# Patient Record
Sex: Female | Born: 1965
Health system: Southern US, Community
[De-identification: ages and names within clinical notes are randomized; demographics above are authoritative.]

## PROBLEM LIST (undated history)

## (undated) DIAGNOSIS — M797 Fibromyalgia: Secondary | ICD-10-CM

## (undated) DIAGNOSIS — T7840XA Allergy, unspecified, initial encounter: Secondary | ICD-10-CM

## (undated) DIAGNOSIS — N809 Endometriosis, unspecified: Secondary | ICD-10-CM

## (undated) DIAGNOSIS — R609 Edema, unspecified: Secondary | ICD-10-CM

## (undated) DIAGNOSIS — K219 Gastro-esophageal reflux disease without esophagitis: Secondary | ICD-10-CM

## (undated) DIAGNOSIS — F329 Major depressive disorder, single episode, unspecified: Secondary | ICD-10-CM

## (undated) DIAGNOSIS — R600 Localized edema: Secondary | ICD-10-CM

## (undated) DIAGNOSIS — F419 Anxiety disorder, unspecified: Secondary | ICD-10-CM

## (undated) DIAGNOSIS — F32A Depression, unspecified: Secondary | ICD-10-CM

## (undated) DIAGNOSIS — J302 Other seasonal allergic rhinitis: Secondary | ICD-10-CM

## (undated) DIAGNOSIS — Z87442 Personal history of urinary calculi: Secondary | ICD-10-CM

## (undated) DIAGNOSIS — M549 Dorsalgia, unspecified: Secondary | ICD-10-CM

## (undated) DIAGNOSIS — G43909 Migraine, unspecified, not intractable, without status migrainosus: Secondary | ICD-10-CM

## (undated) DIAGNOSIS — D649 Anemia, unspecified: Secondary | ICD-10-CM

## (undated) DIAGNOSIS — M62838 Other muscle spasm: Secondary | ICD-10-CM

## (undated) DIAGNOSIS — Z86718 Personal history of other venous thrombosis and embolism: Secondary | ICD-10-CM

## (undated) DIAGNOSIS — E785 Hyperlipidemia, unspecified: Secondary | ICD-10-CM

## (undated) DIAGNOSIS — G935 Compression of brain: Secondary | ICD-10-CM

## (undated) DIAGNOSIS — R519 Headache, unspecified: Secondary | ICD-10-CM

## (undated) DIAGNOSIS — G8929 Other chronic pain: Secondary | ICD-10-CM

## (undated) DIAGNOSIS — M1990 Unspecified osteoarthritis, unspecified site: Secondary | ICD-10-CM

## (undated) DIAGNOSIS — R112 Nausea with vomiting, unspecified: Secondary | ICD-10-CM

## (undated) DIAGNOSIS — G971 Other reaction to spinal and lumbar puncture: Secondary | ICD-10-CM

## (undated) DIAGNOSIS — Z8614 Personal history of Methicillin resistant Staphylococcus aureus infection: Secondary | ICD-10-CM

## (undated) DIAGNOSIS — R531 Weakness: Secondary | ICD-10-CM

## (undated) DIAGNOSIS — G894 Chronic pain syndrome: Secondary | ICD-10-CM

## (undated) DIAGNOSIS — Z9889 Other specified postprocedural states: Secondary | ICD-10-CM

## (undated) DIAGNOSIS — K5909 Other constipation: Secondary | ICD-10-CM

## (undated) DIAGNOSIS — G629 Polyneuropathy, unspecified: Secondary | ICD-10-CM

## (undated) DIAGNOSIS — R091 Pleurisy: Secondary | ICD-10-CM

## (undated) DIAGNOSIS — J189 Pneumonia, unspecified organism: Secondary | ICD-10-CM

## (undated) HISTORY — DX: Depression, unspecified: F32.A

## (undated) HISTORY — DX: Hyperlipidemia, unspecified: E78.5

## (undated) HISTORY — PX: COLONOSCOPY: SHX174

## (undated) HISTORY — PX: APPENDECTOMY: SHX54

## (undated) HISTORY — PX: OTHER SURGICAL HISTORY: SHX169

## (undated) HISTORY — PX: REDUCTION MAMMAPLASTY: SUR839

## (undated) HISTORY — PX: SPINE SURGERY: SHX786

## (undated) HISTORY — PX: LAPAROSCOPIC CHOLECYSTECTOMY: SUR755

## (undated) HISTORY — DX: Headache, unspecified: R51.9

## (undated) HISTORY — PX: BACK SURGERY: SHX140

## (undated) HISTORY — PX: ESOPHAGOGASTRODUODENOSCOPY: SHX1529

## (undated) HISTORY — PX: BRAIN SURGERY: SHX531

## (undated) HISTORY — PX: DILATION AND CURETTAGE OF UTERUS: SHX78

## (undated) HISTORY — DX: Gastro-esophageal reflux disease without esophagitis: K21.9

## (undated) HISTORY — DX: Allergy, unspecified, initial encounter: T78.40XA

## (undated) HISTORY — DX: Major depressive disorder, single episode, unspecified: F32.9

---
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
## 1992-05-18 HISTORY — PX: TOTAL ABDOMINAL HYSTERECTOMY: SHX209

## 1998-05-18 DIAGNOSIS — R091 Pleurisy: Secondary | ICD-10-CM

## 1998-05-18 HISTORY — DX: Pleurisy: R09.1

## 1999-05-19 HISTORY — PX: CARPAL TUNNEL RELEASE: SHX101

## 2001-01-28 ENCOUNTER — Encounter: Payer: Self-pay | Admitting: Neurosurgery

## 2001-01-28 ENCOUNTER — Encounter: Admission: RE | Admit: 2001-01-28 | Discharge: 2001-01-28 | Payer: Self-pay | Admitting: Neurosurgery

## 2001-01-31 ENCOUNTER — Encounter: Admission: RE | Admit: 2001-01-31 | Discharge: 2001-01-31 | Payer: Self-pay | Admitting: Neurosurgery

## 2001-01-31 ENCOUNTER — Encounter: Payer: Self-pay | Admitting: Neurosurgery

## 2001-02-23 ENCOUNTER — Encounter: Payer: Self-pay | Admitting: Neurosurgery

## 2001-03-01 ENCOUNTER — Ambulatory Visit (HOSPITAL_COMMUNITY): Admission: RE | Admit: 2001-03-01 | Discharge: 2001-03-02 | Payer: Self-pay | Admitting: Neurosurgery

## 2001-03-01 ENCOUNTER — Encounter: Payer: Self-pay | Admitting: Neurosurgery

## 2001-03-01 HISTORY — PX: POSTERIOR CERVICAL LAMINECTOMY: SHX2248

## 2002-07-18 ENCOUNTER — Encounter: Payer: Self-pay | Admitting: Neurosurgery

## 2002-07-18 ENCOUNTER — Encounter: Admission: RE | Admit: 2002-07-18 | Discharge: 2002-07-18 | Payer: Self-pay | Admitting: Neurosurgery

## 2002-08-07 ENCOUNTER — Inpatient Hospital Stay (HOSPITAL_COMMUNITY): Admission: AD | Admit: 2002-08-07 | Discharge: 2002-08-13 | Payer: Self-pay | Admitting: Neurosurgery

## 2002-08-09 ENCOUNTER — Encounter: Payer: Self-pay | Admitting: Neurosurgery

## 2002-09-05 ENCOUNTER — Ambulatory Visit (HOSPITAL_COMMUNITY): Admission: RE | Admit: 2002-09-05 | Discharge: 2002-09-06 | Payer: Self-pay | Admitting: Neurosurgery

## 2003-07-19 ENCOUNTER — Encounter: Admission: RE | Admit: 2003-07-19 | Discharge: 2003-07-19 | Payer: Self-pay | Admitting: Neurosurgery

## 2003-08-02 ENCOUNTER — Encounter: Admission: RE | Admit: 2003-08-02 | Discharge: 2003-08-02 | Payer: Self-pay | Admitting: Neurosurgery

## 2003-08-31 ENCOUNTER — Encounter (INDEPENDENT_AMBULATORY_CARE_PROVIDER_SITE_OTHER): Payer: Self-pay | Admitting: Specialist

## 2003-08-31 ENCOUNTER — Ambulatory Visit (HOSPITAL_COMMUNITY): Admission: RE | Admit: 2003-08-31 | Discharge: 2003-09-01 | Payer: Self-pay | Admitting: Neurosurgery

## 2003-12-21 ENCOUNTER — Encounter: Admission: RE | Admit: 2003-12-21 | Discharge: 2003-12-21 | Payer: Self-pay | Admitting: Neurosurgery

## 2003-12-24 ENCOUNTER — Inpatient Hospital Stay (HOSPITAL_COMMUNITY): Admission: RE | Admit: 2003-12-24 | Discharge: 2003-12-28 | Payer: Self-pay | Admitting: Neurosurgery

## 2003-12-31 ENCOUNTER — Emergency Department (HOSPITAL_COMMUNITY): Admission: EM | Admit: 2003-12-31 | Discharge: 2003-12-31 | Payer: Self-pay | Admitting: Emergency Medicine

## 2004-01-03 ENCOUNTER — Inpatient Hospital Stay (HOSPITAL_COMMUNITY): Admission: AD | Admit: 2004-01-03 | Discharge: 2004-01-10 | Payer: Self-pay | Admitting: Neurological Surgery

## 2004-04-22 ENCOUNTER — Encounter: Admission: RE | Admit: 2004-04-22 | Discharge: 2004-04-22 | Payer: Self-pay | Admitting: Neurosurgery

## 2005-03-01 ENCOUNTER — Emergency Department (HOSPITAL_COMMUNITY): Admission: EM | Admit: 2005-03-01 | Discharge: 2005-03-01 | Payer: Self-pay | Admitting: Emergency Medicine

## 2005-03-02 ENCOUNTER — Emergency Department (HOSPITAL_COMMUNITY): Admission: EM | Admit: 2005-03-02 | Discharge: 2005-03-02 | Payer: Self-pay | Admitting: Emergency Medicine

## 2005-05-20 ENCOUNTER — Ambulatory Visit (HOSPITAL_COMMUNITY): Admission: AD | Admit: 2005-05-20 | Discharge: 2005-05-23 | Payer: Self-pay | Admitting: Neurosurgery

## 2005-05-20 HISTORY — PX: OTHER SURGICAL HISTORY: SHX169

## 2006-12-20 ENCOUNTER — Inpatient Hospital Stay (HOSPITAL_COMMUNITY): Admission: RE | Admit: 2006-12-20 | Discharge: 2006-12-24 | Payer: Self-pay | Admitting: Neurosurgery

## 2006-12-20 HISTORY — PX: OTHER SURGICAL HISTORY: SHX169

## 2007-02-04 ENCOUNTER — Ambulatory Visit (HOSPITAL_COMMUNITY): Admission: RE | Admit: 2007-02-04 | Discharge: 2007-02-06 | Payer: Self-pay | Admitting: Neurosurgery

## 2007-03-08 ENCOUNTER — Encounter: Admission: RE | Admit: 2007-03-08 | Discharge: 2007-03-08 | Payer: Self-pay | Admitting: Neurosurgery

## 2007-05-10 ENCOUNTER — Encounter: Admission: RE | Admit: 2007-05-10 | Discharge: 2007-05-10 | Payer: Self-pay | Admitting: Neurosurgery

## 2007-10-13 ENCOUNTER — Encounter: Admission: RE | Admit: 2007-10-13 | Discharge: 2007-10-13 | Payer: Self-pay | Admitting: Neurosurgery

## 2007-11-10 ENCOUNTER — Encounter: Admission: RE | Admit: 2007-11-10 | Discharge: 2007-11-10 | Payer: Self-pay | Admitting: Unknown Physician Specialty

## 2008-04-27 ENCOUNTER — Encounter: Admission: RE | Admit: 2008-04-27 | Discharge: 2008-04-27 | Payer: Self-pay | Admitting: Neurosurgery

## 2009-05-18 HISTORY — PX: LAPAROSCOPIC GASTRIC BANDING: SHX1100

## 2009-07-23 ENCOUNTER — Encounter: Admission: RE | Admit: 2009-07-23 | Discharge: 2009-07-23 | Payer: Self-pay | Admitting: Neurosurgery

## 2009-07-25 ENCOUNTER — Inpatient Hospital Stay (HOSPITAL_COMMUNITY): Admission: RE | Admit: 2009-07-25 | Discharge: 2009-07-26 | Payer: Self-pay | Admitting: Neurosurgery

## 2009-07-25 HISTORY — PX: ANTERIOR CERVICAL DECOMP/DISCECTOMY FUSION: SHX1161

## 2009-08-13 ENCOUNTER — Ambulatory Visit (HOSPITAL_COMMUNITY): Admission: RE | Admit: 2009-08-13 | Discharge: 2009-08-15 | Payer: Self-pay | Admitting: Neurosurgery

## 2009-08-13 ENCOUNTER — Encounter: Admission: RE | Admit: 2009-08-13 | Discharge: 2009-08-13 | Payer: Self-pay | Admitting: Neurosurgery

## 2009-08-18 ENCOUNTER — Emergency Department (HOSPITAL_COMMUNITY): Admission: EM | Admit: 2009-08-18 | Discharge: 2009-08-18 | Payer: Self-pay | Admitting: Emergency Medicine

## 2010-01-01 ENCOUNTER — Encounter: Admission: RE | Admit: 2010-01-01 | Discharge: 2010-01-01 | Payer: Self-pay | Admitting: Unknown Physician Specialty

## 2010-06-08 ENCOUNTER — Encounter: Payer: Self-pay | Admitting: Neurosurgery

## 2010-08-06 LAB — PROTIME-INR
INR: 0.98 (ref 0.00–1.49)
Prothrombin Time: 12.9 seconds (ref 11.6–15.2)

## 2010-08-06 LAB — CBC
HCT: 50.9 % — ABNORMAL HIGH (ref 36.0–46.0)
Hemoglobin: 17.1 g/dL — ABNORMAL HIGH (ref 12.0–15.0)
MCHC: 33.7 g/dL (ref 30.0–36.0)
MCV: 89.4 fL (ref 78.0–100.0)
Platelets: 290 10*3/uL (ref 150–400)
RBC: 5.69 MIL/uL — ABNORMAL HIGH (ref 3.87–5.11)
RDW: 13.1 % (ref 11.5–15.5)
WBC: 14.5 10*3/uL — ABNORMAL HIGH (ref 4.0–10.5)

## 2010-08-06 LAB — HEPATIC FUNCTION PANEL
ALT: 16 U/L (ref 0–35)
AST: 17 U/L (ref 0–37)
Albumin: 3.9 g/dL (ref 3.5–5.2)
Alkaline Phosphatase: 116 U/L (ref 39–117)
Bilirubin, Direct: 0.2 mg/dL (ref 0.0–0.3)
Indirect Bilirubin: 0.8 mg/dL (ref 0.3–0.9)
Total Bilirubin: 1 mg/dL (ref 0.3–1.2)
Total Protein: 7 g/dL (ref 6.0–8.3)

## 2010-08-06 LAB — URINALYSIS, ROUTINE W REFLEX MICROSCOPIC
Bilirubin Urine: NEGATIVE
Glucose, UA: NEGATIVE mg/dL
Hgb urine dipstick: NEGATIVE
Ketones, ur: 15 mg/dL — AB
Nitrite: NEGATIVE
Protein, ur: NEGATIVE mg/dL
Specific Gravity, Urine: 1.021 (ref 1.005–1.030)
Urobilinogen, UA: 1 mg/dL (ref 0.0–1.0)
pH: 7.5 (ref 5.0–8.0)

## 2010-08-06 LAB — POCT I-STAT, CHEM 8
BUN: 19 mg/dL (ref 6–23)
Calcium, Ion: 1.02 mmol/L — ABNORMAL LOW (ref 1.12–1.32)
Chloride: 102 mEq/L (ref 96–112)
Creatinine, Ser: 0.7 mg/dL (ref 0.4–1.2)
Glucose, Bld: 107 mg/dL — ABNORMAL HIGH (ref 70–99)
HCT: 55 % — ABNORMAL HIGH (ref 36.0–46.0)
Hemoglobin: 18.7 g/dL — ABNORMAL HIGH (ref 12.0–15.0)
Potassium: 6.5 mEq/L (ref 3.5–5.1)
Sodium: 135 mEq/L (ref 135–145)
TCO2: 34 mmol/L (ref 0–100)

## 2010-08-06 LAB — DIFFERENTIAL
Basophils Absolute: 0.1 10*3/uL (ref 0.0–0.1)
Basophils Relative: 1 % (ref 0–1)
Eosinophils Absolute: 0.3 10*3/uL (ref 0.0–0.7)
Eosinophils Relative: 2 % (ref 0–5)
Lymphocytes Relative: 20 % (ref 12–46)
Lymphs Abs: 2.9 10*3/uL (ref 0.7–4.0)
Monocytes Absolute: 1 10*3/uL (ref 0.1–1.0)
Monocytes Relative: 7 % (ref 3–12)
Neutro Abs: 10.2 10*3/uL — ABNORMAL HIGH (ref 1.7–7.7)
Neutrophils Relative %: 70 % (ref 43–77)

## 2010-08-06 LAB — LIPASE, BLOOD: Lipase: 29 U/L (ref 11–59)

## 2010-08-06 LAB — POCT CARDIAC MARKERS
CKMB, poc: <1 ng/mL — ABNORMAL LOW (ref 1.0–8.0)
CKMB, poc: <1 ng/mL — ABNORMAL LOW (ref 1.0–8.0)
Myoglobin, poc: 39.4 ng/mL (ref 12–200)
Myoglobin, poc: 56 ng/mL (ref 12–200)
Troponin i, poc: <0.05 ng/mL (ref 0.00–0.09)
Troponin i, poc: <0.05 ng/mL (ref 0.00–0.09)

## 2010-08-06 LAB — POTASSIUM
Potassium: 4.4 mEq/L (ref 3.5–5.1)
Potassium: 4.9 mEq/L (ref 3.5–5.1)

## 2010-08-10 LAB — CBC
HCT: 40.9 % (ref 36.0–46.0)
HCT: 42.1 % (ref 36.0–46.0)
Hemoglobin: 13.9 g/dL (ref 12.0–15.0)
Hemoglobin: 14.1 g/dL (ref 12.0–15.0)
MCHC: 33.5 g/dL (ref 30.0–36.0)
MCHC: 34.1 g/dL (ref 30.0–36.0)
MCV: 89.8 fL (ref 78.0–100.0)
MCV: 90.1 fL (ref 78.0–100.0)
Platelets: 238 10*3/uL (ref 150–400)
Platelets: 249 10*3/uL (ref 150–400)
RBC: 4.53 MIL/uL (ref 3.87–5.11)
RBC: 4.69 MIL/uL (ref 3.87–5.11)
RDW: 12.6 % (ref 11.5–15.5)
RDW: 12.8 % (ref 11.5–15.5)
WBC: 7.3 10*3/uL (ref 4.0–10.5)
WBC: 8.7 10*3/uL (ref 4.0–10.5)

## 2010-08-10 LAB — BASIC METABOLIC PANEL
BUN: 7 mg/dL (ref 6–23)
BUN: 8 mg/dL (ref 6–23)
CO2: 25 mEq/L (ref 19–32)
CO2: 31 mEq/L (ref 19–32)
Calcium: 9 mg/dL (ref 8.4–10.5)
Calcium: 9.4 mg/dL (ref 8.4–10.5)
Chloride: 102 mEq/L (ref 96–112)
Chloride: 105 mEq/L (ref 96–112)
Creatinine, Ser: 0.67 mg/dL (ref 0.4–1.2)
Creatinine, Ser: 0.71 mg/dL (ref 0.4–1.2)
GFR calc Af Amer: >60 mL/min (ref >60)
GFR calc Af Amer: >60 mL/min (ref >60)
GFR calc non Af Amer: >60 mL/min (ref >60)
GFR calc non Af Amer: >60 mL/min (ref >60)
Glucose, Bld: 108 mg/dL — ABNORMAL HIGH (ref 70–99)
Glucose, Bld: 94 mg/dL (ref 70–99)
Potassium: 3.7 mEq/L (ref 3.5–5.1)
Potassium: 4 mEq/L (ref 3.5–5.1)
Sodium: 139 mEq/L (ref 135–145)
Sodium: 140 mEq/L (ref 135–145)

## 2010-08-10 LAB — DIFFERENTIAL
Basophils Absolute: 0.1 10*3/uL (ref 0.0–0.1)
Basophils Relative: 1 % (ref 0–1)
Eosinophils Absolute: 0.1 10*3/uL (ref 0.0–0.7)
Eosinophils Relative: 1 % (ref 0–5)
Lymphocytes Relative: 24 % (ref 12–46)
Lymphs Abs: 2 10*3/uL (ref 0.7–4.0)
Monocytes Absolute: 0.6 10*3/uL (ref 0.1–1.0)
Monocytes Relative: 7 % (ref 3–12)
Neutro Abs: 5.9 10*3/uL (ref 1.7–7.7)
Neutrophils Relative %: 68 % (ref 43–77)

## 2010-08-10 LAB — CULTURE, BLOOD (ROUTINE X 2)
Culture: NO GROWTH
Culture: NO GROWTH

## 2010-08-10 LAB — SURGICAL PCR SCREEN
MRSA, PCR: NEGATIVE
Staphylococcus aureus: NEGATIVE

## 2010-09-23 DIAGNOSIS — Z9884 Bariatric surgery status: Secondary | ICD-10-CM | POA: Insufficient documentation

## 2010-09-30 NOTE — Op Note (Signed)
NAMEJACK, MINEAU NO.:  [id number]   MEDICAL RECORD NO.:  [id number]          PATIENT TYPE:  INP   LOCATION:  5149                         FACILITY:  [facility]   PHYSICIAN:  Donalee Citrin, M.D.        DATE OF BIRTH:  Oct 15, 1965   DATE OF PROCEDURE:  02/04/2007  DATE OF DISCHARGE:  12/24/2006                               OPERATIVE REPORT   PREOPERATIVE DIAGNOSIS:  Lumbar wound hematoma/infection.   POSTOPERATIVE DIAGNOSIS:  Lumbar wound hematoma.   PROCEDURE:  Re-exploration of L4 to S1 fusion with irrigation and  debridement of lumbar wound hematoma.   SURGEON:  Donalee Citrin, M.D.   ANESTHESIA:  General endotracheal anesthesia.   HISTORY OF PRESENT ILLNESS:  The patient is very pleasant 41-year female  who underwent redo decompressive laminectomy and fusion at L4-L5 and L5-  S1 approximately six weeks ago. Over the last two weeks, the patient  progressively worsening leg pain refractory to steroid medication.  The  patient underwent an MRI scan earlier in the week that showed a dorsal  epidural fluid collection with significant thecal sac compression.  This  had some rim enhancement suspicious for hematoma.  The patient's white  count was 10.9, slightly elevated, and due to the thecal sac  compression, the patient's worsening symptomatology, and the fact that  she had had a fusion, the patient was recommended re-exploration, I&D,  evacuation of the fluid collection.  The risks and benefits of the  operation were explained to the patient who understood and agreed to  proceed forth.   DESCRIPTION OF PROCEDURE:  The patient was brought to the operating room  and was induced under general anesthesia.  She was positioned prone and  her back was prepped and draped in the usual fashion.  Her old incision  was opened up and Bovie cautery was used to dissect through the  subcutaneous tissues.  The lumbar peritoneal shunt catheter was  immediately visualized and care was  taken not to violate it.  There was  an immediate subcutaneous fluid collection that was sent for cultures.  This appeared to be old blood, not purulent. Then the fascia was  dissected off. The hardware was immediately identified. The transverse  connector was identified. Using the transverse connector as a guide, the  remainder of the scar tissue and muscles were dissected off the top of  the transverse connector and this immediately allowed [place] entry into the  dorsal epidural fluid collection which, again, was cultured and sent for  gram stain. This, again, all appeared to be old seromatous fluid and did  not have the appearance or texture of purulence.  It also did not appear  to be CSF.  The dura was inspected and thecal sac was noted to be  markedly decompressed after evacuation of the epidural fluid collection.  Some of the dorsal fibrous epidural clot was dissected off to obtain  access into the thecal sac.  The neural foramina were all reinspected  and noted to be widely patent.  Then Pulsavac irrigation with 3 liters  of vancomycin irrigation was used to wash  out the wound.  Then two  median Hemovac drains were placed threading underneath the cross-link  cephalocaudal in the epidural  space.  The muscle was then reapproximated with interrupted Vicryl, the  fascia was then also reapproximated with interrupted Vicryl, the  subcutaneous tissue was closed with interrupted Vicryl, and the skin was  closed running 4-0 subcuticular.  Benzoin and Steri-Strips were applied.  The patient went to the recovery room in stable condition.           ______________________________  Donalee Citrin, M.D.     GC/MEDQ  D:  02/04/2007  T:  02/05/2007  Job:  [id number]

## 2010-09-30 NOTE — Discharge Summary (Signed)
NAMEALYZA, Katrina Fernandez NO.:  [id number]   MEDICAL RECORD NO.:  [id number]          PATIENT TYPE:  INP   LOCATION:  5149                         FACILITY:  [facility]   PHYSICIAN:  Donalee Citrin, M.D.        DATE OF BIRTH:  [birth year]   DATE OF ADMISSION:  12/20/2006  DATE OF DISCHARGE:  12/24/2006                               DISCHARGE SUMMARY   ADMISSION DIAGNOSIS:  Degenerative disk disease and ruptured disk, L4-5  and L5-S1.   PROCEDURE:  Lumbar laminectomy, decompressive, with posterior lumbar  interbody fusion, L4-5 and L5-S1.   HOSPITAL COURSE:  The patient was admitted as an EMA, went to the  operating room and underwent the above-mentioned procedure.  Postop the  patient did very well and __________  the floor.  On the floor the  patient convalesced well.  Pain was difficult to control initially;  however, medications were adjusted and changed and she was able to  progressively mobilize over the next 24-48 hours.  Her drain was able to  be taken out 48 hours postop.  Over the next 2 days the patient did very  well, getting up out of bed, ambulating, and was voiding spontaneously.  The pain was controlled on pills.  The patient was stable to be  discharged home with scheduled follow-up in 2 weeks with neurosurgery.           ______________________________  Donalee Citrin, M.D.     GC/MEDQ  D:  02/02/2007  T:  02/02/2007  Job:  [id number]

## 2010-09-30 NOTE — Op Note (Signed)
NAMEMARLANE, HIRSCHMANN            ACCOUNT NO.:  [id number]   MEDICAL RECORD NO.:  [id number]          PATIENT TYPE:  INP   LOCATION:  3172                         FACILITY:  [facility]   PHYSICIAN:  Donalee Citrin, M.D.        DATE OF BIRTH:  [birth year]   DATE OF PROCEDURE:  12/20/2006  DATE OF DISCHARGE:                               OPERATIVE REPORT   PREOPERATIVE DIAGNOSES:  Degenerative disk disease L4-5, L5-S1 with left  greater than right L5 and S1 radiculopathies, severe diskogenic  mechanical low back pain, arachnoiditis.   POSTOPERATIVE DIAGNOSES:  Not given.   PROCEDURE:  Redo decompressive laminectomy L4-5 and L5-S1, posterior  lumbar interbody fusion L4-5, L5-S1 using a hybrid tangent allograft  wedge and Telamon PEEK cage packed with locally harvested autograft  mixed with Progenics bone substitute, 10 x 22 and 10 x 26 respectively  L4-5 and 8 x 22 and 8 x 26 respectively at L5-S1. Pedicle screw  fixation L4-S1 using the 6.35 Legacy pedicle screw system,  posterolateral arthrodesis L4-S1 using locally harvested autograft mixed  with DBX bone substitute and placement of medication Hemovac drain.   SURGEON:  Dr. Donalee Citrin.   ASSISTANT:  Dr. Aliene Beams.   ANESTHESIA.:  General endotracheal.   HISTORY OF PRESENT ILLNESS:  The patient is a very pleasant, 45 year old  female whose had  longstanding back and bilateral leg pain and has  undergone previous laminotomies with diskectomies on the right side at  L4-5 and L5-S1. However, the patient had progressively worsening back  pain that was mechanical in nature worse when going from the lying to  sitting position as well as radicular pain in conjunction with a  baseline recognized arachnoiditis that I counseled her would not be  better with this form surgery and actually could be made worse, but she  said her main complaint was predominantly the back pain with secondary  radiculopathy. Imaging showed severe degenerative  collapse at L5-S1  causing marked compression of the fibers due to the degenerative  collapse as well as a large central disk herniation at L4-5 with marked  stenosis and degeneration at that level as well.  So the risks,  benefits, and alternatives of the operation were explained to the  patient. She understands and after failure of all forms of conservative  treatment agreed to proceed forward. The patient was brought to the OR  and was induced under general anesthesia, positioned prone on the Wilson  frame, back prepped and prepped in the usual sterile fashion.  Preoperative x-ray localized the L5 spinous process. Her old incision  was opened up and Bovie electrocautery was used to take down skin,  subperiosteal dissection carried out to the lamina of L4-5 and S1  bilaterally.  During the dissection, the part of the LP shunt catheter  was immediately visualized and care was taken to avoid this during the  dissection and it was actually entering in up at the L3-4 disk space  level, so after complete dissection, the pedicles have been identified  at L4-5, S1. The scar tissue dissected off of the right side laminotomy  defects at L4-5 and L5-S1. Intraoperative x-rays confirmed localization  at the appropriate level.  The residual spinous process at L4 and L5  were removed, a complete medial decompressive laminotomy was performed  at L4-5 and L5-S1 first working on the patient's left side in virgin  territory. Complete medial facetectomy was performed at L4-5 and S1.  There was noted to be a tremendous amount of epidural fat causing marked  stenosis of the thecal sac and displacing it to the right.  This was all  teased away with a 4 Penfield and removed with pituitary rongeurs and  Kerrison punches.  Then after all the neural foramina were decompressed  and unroofed at L4-5 and S1 on the left side, attention taken to the  right side. Scar tissue was extensively dissected free using a 4   Penfield and dental dissectors. Complete medial facetectomies were  performed decompressing the L4-5 and 1 roots respectively on the right  side and then after the decompression had been completed and all neural  foramina were widely patent, attention was taken to pedicle screw  placement. Using a high-speed drill pilot holes were drilled at L4 and  cannulated with the awl,  probed, tapped with a 5.5 tap, probed again  and a 6.5 x 45 screw inserted on the left at L4 using both external bony  landmarks and from within the canal confirming no mediolateral breach.  This screw had excellent purchase and was placed well. In a similar  fashion, the 6.5 x 45 screw was inserted at L5 and a 6.5 x 35 at S1 and  then the right-sided screws were inserted also in similar fashion. After  all six screws had been placed, attention taken to the interbody work,  there was extensive scar tissue at the disk space levels at L4-5 and L5-  S1 on the right.  This was teased away with a 4 Penfield. The epidural  veins were coagulated. A lot of the epidural lipomatosis was also  dissected off the roots on this side identifying the neural foramen and  the lateral disk space.  Then using D'Errico reflecting the L4-L5 nerve  root medially, annulotomy was made with a #11 blade scalpel and cleaned  out and a size 10 distractor was inserted.  This had good apposition of  the endplates, felt to be appropriate sizing for the grafts.  Then on  the right side at L5-S1 in a similar fashion, the disk space was cleaned  out. Initially a size 7 distractor was placed because the disk  was  noted to be markedly degenerated collapsed. Then working on the left  side reflecting the left S1 nerve root medially, annulotomy was made,  the disk space was cleaned out and this was stepped up to a 9 distractor  with a 7 distractor removed from the right side. Then initially working  on the right side at L5-S1 level, the scar tissue was freed  up, the  degenerative was cleaned out. A size 8 cutter and scissors were used to  prepare the endplates. A size 8 x 26 mm tangent allograft wedge was  inserted on the right side.  Then on the left side, the distractor was  removed in a similar fashion.  The disk space was cleaned out, locally  harvested autograft mixed with DBX bone substitute was packed against  the right sided tangent and a Telamon 8 x 22 mm cage was packed with  Progenix and locally harvested autograft was inserted on  the left side.  Fluoroscopy confirmed each step along the way, good depth and  trajectory. Then L4-5 was performed in a similar fashion using 10 x 22  Telamon  cage and a 10 x 26 tangent allograft wedge. There was noted to  be a very large central disk herniation at L4-5. This was teased away  with a downgoing Epstein curette and upbiting pituitaries and at the end  of the diskectomy there was no __________ or either L4-5 or S1 nerve  root. After all the interbody work had been completed, aggressive  irrigation was performed then aggressive decortication was carried out  __________  at L4-5 and S1. The remainder of the autograft mixed with  Progenix was packed in the lateral gutters.  Then 60 mm rods were  inserted, tapped, tightened, __________ at S1. The L5 pedicle screw was  compressed against S1 and the L4 compressed against L5. All the neural  foramina were re-explored at this point to confirm no migration of graft  material and wide patency. Then Gelfoam was laid atop the dura, a 422  cross-link was inserted between the 4-5 screws.  After meticulous  hemostasis had been maintained, the muscle and fascia were  reapproximated with interrupted Vicryl after placement of medium Hemovac  drain and care was taken to avoid the lumboperitoneal catheter.  The  wound was closed in layers with Vicryl with a running 4-0 subcuticular,  Dermabond on the skin. At the end of the case needle count and sponge  count  were correct.           ______________________________  Donalee Citrin, M.D.     GC/MEDQ  D:  12/20/2006  T:  12/20/2006  Job:  [id number]

## 2010-10-03 NOTE — Op Note (Signed)
[place]  Patient:    Katrina Fernandez, Katrina Fernandez Visit Number: [id number] MRN: [id number]          Service Type: DSU Location: 3000 [id number] Attending Physician:  Mariam Dollar Dictated by:   Garlon Hatchet., M.D. Proc. 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  PROCEDURE:  Posterior cervical laminectomy and decompressive laminectomy and foraminotomy with C7-8 nerve roots with exploration of the C8 perineural cyst, placement to seal.  SURGEON:  Garlon Hatchet., M.D.  ASSISTANT:  Julio Sicks, M.D.  ANESTHESIA:  General endotracheal.  HISTORY OF PRESENT ILLNESS:  The patient is a very pleasant 45 year old female who has had long-standing neck and right arm pain radiating down through her triceps into the first two fingers of her right hand with numbness and tingling in the same distribution.  Preoperative examination showed the patient had weakness of her triceps.  She also historically had spinal headaches.  Headaches were much worse when sitting, better when laying down. MRI, CT myelography showed a conjoin nerve roots of C7-8 with delayed filling of a perineural cyst that looked like it was coming off the C7-8 complex, and noted to have compression of both nerve roots.  As the patient had failed conservative treatment, and extensively counseled of the risks, benefits, methods of surgery, it was decided to proceed forward with decompressive cervical laminectomy and foraminotomies of C7 and C8 and exploration of the cyst with possible resection.  The patient had an understanding of the risk, we proceeded forward.  DESCRIPTION OF PROCEDURE:  The patient was brought to the OR, was induced under general anesthesia.  Positioned prone in pin.  The neck was prepped and draped in  the usual sterile fashion.  A midline incision was made after localization of a needle over C7-T1 disk space.  Subperiosteal dissection was carried out.  The lamina and spinous process of C7 and T1 on the right side, exposing the facet complex.  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  The entire facet complex at C7 was removed exposing the C7 nerve root.  Attention was taken to removal of the top part of the lamina of T1, and identification of the C8 nerve root.  These were noted to be conjoin, and followed up their foramen. The C8 nerve root was noted to give rise to a large perineural cyst that looked like it extended in a fusiform fashion distally around the foramen. This was exposed and dissected free, and noted to be completely within the nerve root with no plane to dissect it free from the nerve root, and as dictated, the fusion appearance extending distally out the neuroforamin was noted to have no plane to be able to dissect free and bring back proximally. The C7 nerve root was teased away from this, and a plane was able to be developed of the distal C7 nerve root from this cystic nerve.  This disk was noted on certain aspects of it to have very thin membrane.  It was decided at this  time not to resect the cyst and to place the seal around it to possibly prevent any spinal fluid leak that may be contributing to her spinal headaches.  The seal was applied, and both neuroforamina were widely decompressed, and the cyst was widely decompressed.  Gelfoam was overlaid on top of the dura.  Meticulous hemostasis was maintained.  The muscle and fascia were closed with 0 interrupted Vicryl.  The subcutaneous tissue was closed with 2-0 interrupted Vicryl.  The skin was closed with running 4-0 subcuticular.  Benzoin and Steri-Strips were applied.   The patient went to the recovery room in stable condition.  At the end of the case, all sponge, needle, and instrument counts were correct. Dictated by:   Garlon Hatchet., M.D. Attending Physician:  Mariam Dollar DD:  04/27/01 TD:  04/27/01 Job: [id number]

## 2010-10-03 NOTE — Op Note (Signed)
NAME:  Katrina Fernandez, Katrina Fernandez                      ACCOUNT NO.:  [id number]   MEDICAL RECORD NO.:  [id number]                   PATIENT TYPE:  OIB   LOCATION:  3012                                 FACILITY:  [facility]   PHYSICIAN:  Donalee Citrin, M.D.                     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   PROCEDURE:  Lumbar peritoneal shunt placement using the MMT lumboperitoneal  shunt with the 85 horizontal pressure valve with _______ 7 device.   SURGEON:  Donalee Citrin, M.D.   ASSISTANT:  Reinaldo Meeker, M.D.   ANESTHESIA:  General endotracheal.   CLINICAL HISTORY:  The patient is a very pleasant 45 year old female who has  had longstanding neck, right arm pain, and cervicogenic headaches, that  underwent a posterior cervical foraminotomy and decompression for a right C8  perineural cyst.  Postoperatively she did very well for the year  postoperatively, with complete resolution of her neck and arm pain as well  as her headaches.  However, approximately a year postop the patient started  developing worsening cervicogenic headaches that began in the back of her  neck, were worse when she was up, better when she was lying down, they were  postural in nature as well as worse pain between her shoulder blades as well  as pain radiating into her right arm.  Re-imaging with myelography showed  extensive CSF filling of this perineural cyst, and her symptomatology due to  its postural nature although in the filling of the cyst was felt due to more  CSF flow dynamics as opposed to more compressive pathology from the cyst.  The patient was brought to the hospital and underwent lumbar drain placement  with the lumbar drain in place.  She did very well and had complete  resolution of her symptomatology with both her neck  pain and arm pain as  well as her headaches.  Because of this, the patient was felt to be a  potential candidate for a lumboperitoneal shunt placement.  I extensively  went over the risks and benefits of surgery with her, inclusive of failure  of resolution of symptoms.  She understands and agreed to proceed forward.   DESCRIPTION OF PROCEDURE:  The patient was brought into the OR, was induced  under general anesthesia, placed in the left lateral decubitus position.  A  small midline incision was made in the low back as well as in the right  flank and in the paramedian along the rectus sheath.  The subcutaneous  sheath was dissected out using a large Tuohy needle.  The CSF space was  cannulated and a lumbar drain was threaded through the Tuohy needle into the  CSF space.  Good drainage was  noted out of the lumbar catheter.  Then a  small incision was made in her right flank at the level of the umbilicus,  and this was used to pass a tunneler from that incision back to the midline  lumbar incision and then the lumbar drain was threaded to the side.  Then  the rectus sheath was, incision over the paramedian of the rectus sheath was  opened up.  Both anterior and posterior rectus sheaths were opened, the  peritoneum was identified and opened, and then the peritoneal catheter with  spout system was attached to the lumbar drain along this side and then  tunneled anteriorly to the abdominal incision, and then the peritoneal  catheter was placed.  The valve was secured in place with Vicryl suture as  well as a small pocket was made to prevent this valve system from shifting  or moving.  The attachment of the lumbar drain to the valve system was  sutured off with a 2-0 silk.  The peritoneal catheter was placed after this,  and flow was appreciated through the whole system just prior to the  placement of the peritoneal catheter.  Then pursestring chromic was tied  around the peritoneum and closed  the catheter.  Then the posterior and  anterior rectus sheaths were closed as well as the subcutaneous tissue, and  the skin was closed with staples.  Then both other incisions were closed  with Vicryl and staples as well.  Prior to closure all incisions were  copiously irrigated with antibiotic irrigation.  At each step along the way  there was noted to be good drainage, and the peritoneum was definitely  identified and the peritoneal catheter was noted to be placed well within  the peritoneum.  At the end of the case all needle counts and sponge counts  were correct.  The patient went to the recovery room in stable condition.                                               Donalee Citrin, M.D.    GC/MEDQ  D:  09/05/2002  T:  09/06/2002  Job:  [id number]

## 2010-10-03 NOTE — Op Note (Signed)
NAME:  Katrina Fernandez, Katrina Fernandez                      ACCOUNT NO.:  [id number]   MEDICAL RECORD NO.:  [id number]                   PATIENT TYPE:  OIB   LOCATION:  3028                                 FACILITY:  [facility]   PHYSICIAN:  Donalee Citrin, M.D.                     DATE OF BIRTH:  02/22/1966   DATE OF PROCEDURE:  08/31/2003  DATE OF DISCHARGE:  09/01/2003                                 OPERATIVE REPORT   PREOPERATIVE DIAGNOSIS:  Painful lumbar radiculopathy from lumbar puncture  shunt.   POSTOPERATIVE DIAGNOSIS:  Painful lumbar radiculopathy from lumbar puncture  shunt.   PROCEDURE:  Removal of lumbar puncture shunt.   SURGEON:  Donalee Citrin, M.D.   ANESTHESIA:  General endotracheal.   INDICATIONS:  The patient is a very pleasant 45 year old female who has a  longstanding history of neck trouble with right sided C8 nerve peroneal cyst  which was treated with decompression and development of CSF leakage into the  cyst.  There was a drop in lumbar drainage and got better.  Had LP shunt  placed, and the LP shunt significantly helped the headaches and the right  arm pain.  However, the patient started developing a lumbar radiculopathy.  This was initially felt to be due to more spondylosis and was treated with  epidurals and pain management and physical therapy.  However, it got  progressively worse and was felt to be painful right around the entry site  of the catheter as a radicular pain so the patient was counseled for removal  of LP shunt.  We went over the risks and benefits of surgery with her  inclusive of, but not limited to, the recurrence of her previous postural  symptoms with the peroneal cyst.  The patient was induced and general  anesthesia was positioned in the left lateral decubitus, right side up.  Back and abdominal incisions were prepped and draped.  The back incision was  infiltrated with 10 cc of lidocaine with epinephrine and scalp was used to  open the skin.  Bovie  electrocautery was used to dissect down the catheter.  The catheter was removed from the intrathecal space.  The hole in the  catheter had entered through the subcutaneous and was tied off with a  pursestring with a figure-of-eight and then the incision on the side where  the connector was in the LP shunt was opened up.  The connector was removed.  The peritoneal as well as the residual catheter was removed through the  incision and both incisions were copiously irrigated.  Next, 2-0 Vicryl was  used to close the subcutaneous tissue and the skin was closed with staples.  The wound was dressed.  The patient was taken to the recovery room in stable  condition.  Donalee Citrin, M.D.    GC/MEDQ  D:  08/31/2003  T:  09/02/2003  Job:  [id number]

## 2010-10-03 NOTE — Discharge Summary (Signed)
Katrina Fernandez, Katrina Fernandez NO.:  [id number]   MEDICAL RECORD NO.:  [id number]          PATIENT TYPE:  INP   LOCATION:  3010                         FACILITY:  [facility]   PHYSICIAN:  Donalee Citrin, M.D.        DATE OF BIRTH:  [birth year]   DATE OF ADMISSION:  12/24/2003  DATE OF DISCHARGE:  12/28/2003                                 DISCHARGE SUMMARY   ADMISSION DIAGNOSES:  Perineural cyst with spinal fluid leakage and postural  headaches.   PROCEDURES:  Placement of lumboperitoneal shunt.   SURGEON:  Donalee Citrin, M.D.   ASSISTANT:  Kathaleen Maser. Pool, M.D.   ANESTHESIA:  General endotracheal.   HISTORY OF PRESENT ILLNESS:  The patient is a 45 year old female who has had  longstanding problems with management of perineural cyst with CSF drainage  into the cyst and postural headaches with arm pain and low back pain.  The  patient failed all forms of conservative treatment and only responded well  with CSF diversion.  The patient was admitted EMA, went to the operating  room, and underwent lumboperitoneal shunt placement.  Postoperatively, the  patient did very well, went to recovery room and then to the floor.  On the  floor, the patient had significant pain control issues with postural  headaches, getting used to the new pressures she was seeing secondary to the  lumboperitoneal shunt.  This was progressively managed with increasing doses  of narcotics, and patient was slowly mobilized additionally.   Over the next several days, this slowly improved to where she was able to be  ambulatory, tolerating a regular diet, and voiding spontaneously.  On the  fourth hospital day, the patient was able to be discharged home on  OxyContin, Percocet, and Valium for pain.  The patient is scheduled to  follow up in one week in my office for evaluation of the incisions.       GC/MEDQ  D:  02/08/2004  T:  02/08/2004  Job:  [id number]

## 2010-10-03 NOTE — H&P (Signed)
NAME:  Katrina Fernandez, SPAGNUOLO                      ACCOUNT NO.:  [id number]   MEDICAL RECORD NO.:  [id number]                   PATIENT TYPE:  INP   LOCATION:  3002                                 FACILITY:  [facility]   PHYSICIAN:  Tia Alert, MD                  DATE OF BIRTH:  30-May-1965   DATE OF ADMISSION:  01/03/2004  DATE OF DISCHARGE:                                HISTORY & PHYSICAL   CHIEF COMPLAINT:  Headaches, flank pain, arm pain and leg pain.   HISTORY OF PRESENT ILLNESS:  [name] is a 45 year old white female who  is a patient of Donalee Citrin, M.D.'s, who underwent placement of a  lumboperitoneal shunt about 10 days ago.  She has had difficulties  postoperatively.  She has had low pressure headaches which are positional in  nature.  Any time she stands erect, she gets a severe headache.  She  complains of left flank pain is the side opposite the lumboperitoneal shunt  was placed.  She complains of right leg pain which is a chronic complaint  for her and she has continued right upper extremity radiculopathy.  She also  had complaints of urinary retention and hesitancy.  She went to the  emergency department two days ago where they found no urinary retention and  no UTI.  She is admitted for pain control.   PAST MEDICAL HISTORY:  1. Lumboperitoneal shunt x2.  2. Cervical laminectomy.  3. Perineal cyst.  4. Appendectomy at age 82.  5. Cholecystectomy at age 66.  6. Hysterectomy at 61 after multiple laparotomies.  7. Surgical for thoracic outlet syndrome and carpal tunnel syndrome.   MEDICATIONS:  1. OxyContin 20 mg twice a day.  2. Vicodin  as needed.  3. Valium 5 mg t.i.d. p.r.n.  4. Prilosec 20 mg per day.  5. Prozac 40 mg per day.  6. Elavil 25 mg p.o. q.h.s. p.r.n.  7. Xanax 0.5 mg t.i.d. p.r.n.  8. Lidoderm patches as needed.   ALLERGIES:  1. PENICILLIN.  2. MORPHINE.   SOCIAL HISTORY:  She denies any use of tobacco or alcohol products.  She is   married.   FAMILY HISTORY:  Positive for diabetes mellitus, DDTs, cancer and thyroid  problems.   PHYSICAL EXAMINATION:  GENERAL APPEARANCE:  A very pleasant, cooperative  white female who is in some obvious discomfort.  HEENT:  Normocephalic and atraumatic.  Extraocular movements are intact  without nystagmus.  Pupils are equal, round and reactive to light.  NECK:  Supple.  CARDIOVASCULAR:  Regular rate and rhythm.  EXTREMITIES:  No clubbing, cyanosis, or edema.  NEUROLOGIC:  She is awake and alert.  She is oriented x4, no aphagia.  Good  attention span and fund of knowledge.  Good memory.  No facial asymmetry.  Good hearing.  Tongue protrudes in midline.  She can puff out her cheeks.  She has good shoulder shrug.  Her strength is 5/5 throughout with good  muscle tone and bulk.  She is ambulating quite well.  Reflexes okay.   ASSESSMENT/PLAN:  This is a 45 year old white female who is having  difficulties after placement of a lumboperitoneal shunt.  She is having  severe pain which is difficult to control.  She also has low pressure  headaches from the placement of lumboperitoneal shunt.  We will admit her  for IV hydration, place her on some steroids IV and get a head CT because  she is likely over draining from her lumboperitoneal shunt at this time.  All of these things may take time to equilibrate. If she does not improve  over the next few days, she may need revision of the shunt with placement of  a valve.                                                Tia Alert, MD    DSJ/MEDQ  D:  01/04/2004  T:  01/04/2004  Job:  [id number]

## 2010-10-03 NOTE — Op Note (Signed)
Katrina Fernandez, RUMER NO.:  [id number]   MEDICAL RECORD NO.:  [id number]          PATIENT TYPE:  INP   LOCATION:  3002                         FACILITY:  [facility]   PHYSICIAN:  Donalee Citrin, M.D.        DATE OF BIRTH:  1965/06/02   DATE OF PROCEDURE:  DATE OF DISCHARGE:  01/10/2004                               OPERATIVE REPORT   ADMISSION DIAGNOSES:  Headaches, flank pain, arm pain, and leg pain.   HOSPITAL COURSE:  The patient is a very pleasant 45 year old female who  had undergone lumboperitoneal shunt placement; however, presented with  severe left flank pain on the side opposite the lumboperitoneal shunt,  also right leg pain.  She came to the emergency room with some urinary  retention; however, postvoid residuals showed normal volumes.  Bladder  scan shows residual of 18 ml.  The patient was rehydrated, placed on  steroids, and progressively mobilized.  In addition, the patient was  also noted not to have had a bowel movement.  The patient was placed on  laxatives.   Over the next several days, the patient progressively improved.  Back  and leg pain and headaches also improved.  Additional medications  inclusive of caffeine were added, and this also helped with her partial  headaches.  Thoracolumbar MRI also was obtained, and this showed no  evidence of acute pathology to explain urinary retention or back and leg  pain.  Internal medicine was consulted to see the patient for GI  complaints and made some recommendations.  However, the patient  continued to improve.  In addition, the patient's followup labs showed  some elevation of the liver function tests, and this is also  additionally why medicine was consulted, and this was felt possibly due  to some of her medications; so Tylenol was removed from all of her pain  medication and switched to narcotic and ibuprofen.  The patient  subsequently will be discharged home.  Normalization of her labs ensued,  and  she was set up to follow up in two weeks.     ______________________________  Donalee Citrin, M.D.    ______________________________  Donalee Citrin, M.D.    GC/MEDQ  D:  02/08/2004  T:  02/08/2004  Job:  [id number]

## 2010-10-03 NOTE — H&P (Signed)
   NAME:  COSTELLA, SCHWARZ                      ACCOUNT NO.:  [id number]   MEDICAL RECORD NO.:  [id number]                   PATIENT TYPE:  INP   LOCATION:  3040                                 FACILITY:  [facility]   PHYSICIAN:  Donalee Citrin, M.D.                     DATE OF BIRTH:  11-19-1965   DATE OF ADMISSION:  08/07/2002  DATE OF DISCHARGE:                                HISTORY & PHYSICAL   REASON FOR ADMISSION:  Right C8 perineural cyst with __________  and lumbar  drain placement for evaluation of cerebrospinal fluid drainage for  management of headache, neck pain and arm pain secondary to perineural cyst.   HISTORY OF PRESENT ILLNESS:  The patient is a very pleasant 45 year old  female who has had a C8 perineural cyst for several years.  Over a year ago  she underwent decompression, posterior cervical laminectomy and  decompression for management of it.  She did very well for a year.  Over the  last several months she has had progressive worsening with headaches and arm  pain as well as neck pain.  Evaluation with MRI and myelography showed  communication of CSF into the cyst and this is felt to be the cause of her  pain.  The patient was extensively counseled as to the risks and benefits of  potential treatment and outcomes and it was discussed that possible CSF  drainage could divert the fluid away from the cyst with possible  lumboperitoneal shunt placement may benefit her.  In order to confirm this  diagnosis and treatment plan the patient is admitted for lumbar drain  placement.   PAST MEDICAL HISTORY:  Otherwise negative.   PAST SURGICAL HISTORY:  1. Hysterectomy.  2. Appendectomy.  3. Gallbladder.  4. Laparoscopy.   ALLERGIES:  History of an allergies to PENICILLIN and MORPHINE.   SOCIAL HISTORY:  She is a non-smoker.   MEDICATIONS:  1. Estradiol.  2. Zoloft.  3. Percocet.   PHYSICAL EXAMINATION:  GENERAL:  Unremarkable.  The patient has 5/5 strength  and  normal reflexes and sensation.  She is awake, alert and oriented.  LUNGS:  Clear.  NEUROLOGIC:  As before.    IMPRESSION:  She is status post lumbar drain placement that is working  adequately. We will continue to drain 10 mL an hour and we will observe the  patient's symptomatic response status post drain placement.                                               Donalee Citrin, M.D.    GC/MEDQ  D:  08/07/2002  T:  08/07/2002  Job:  [id number]

## 2010-10-03 NOTE — Discharge Summary (Signed)
   NAME:  Katrina Fernandez, Katrina Fernandez                      ACCOUNT NO.:  [id number]   MEDICAL RECORD NO.:  [id number]                   PATIENT TYPE:  INP   LOCATION:  3040                                 FACILITY:  [facility]   PHYSICIAN:  Donalee Citrin, M.D.                     DATE OF BIRTH:  09-07-1965   DATE OF ADMISSION:  08/07/2002  DATE OF DISCHARGE:  08/13/2002                                 DISCHARGE SUMMARY   ADMISSION DIAGNOSIS:  Right peroneal cyst with cerebrospinal fluid leak.   PROCEDURE:  Lumbar drain placement.   HISTORY OF PRESENT ILLNESS:  The patient is a very pleasant 45 year old  female who has had long-standing neck, shoulder and arm pain with the  presence of a peroneal cyst that she had decompressed over a year ago.  The  patient's symptomology progressed and was consistent with severe spinal  headaches and shoulder and arm pain that were postural in nature.  Preoperative imaging with MRI and myelography showed CSF drainage into the  cyst.  This was felt to be the compressive source of the problem.   HOSPITAL COURSE:  The patient was admitted to the hospital for a lumbar  drain trial.  A lumbar drain was placed and with CSF drainage, it was  draining approximately 100 to 150 mL per shift over four days.  The  patient's symptoms completely resolved.  At the end of the four days the  drain was removed. During that time she had complete resolution of the  shoulder and arm pain as well as the headaches and it was felt this was a  positive test for drainage.  During the time she had the lumbar drain in  place she was treated for prophylaxis with vancomycin.  When the drain was  removed she had no evidence of a  CSF leak and was able to be discharged  home for future follow-up of lumbar peroneal shunt placement.                                               Donalee Citrin, M.D.    GC/MEDQ  D:  09/22/2002  T:  09/22/2002  Job:  [id number]

## 2010-10-03 NOTE — Op Note (Signed)
NAME:  Katrina Fernandez, Katrina Fernandez                      ACCOUNT NO.:  [id number]   MEDICAL RECORD NO.:  [id number]                   PATIENT TYPE:  OIB   LOCATION:  3010                                 FACILITY:  [facility]   PHYSICIAN:  Donalee Citrin, M.D.                     DATE OF BIRTH:  April 18, 1966   DATE OF PROCEDURE:  12/24/2003  DATE OF DISCHARGE:                                 OPERATIVE REPORT   PREOPERATIVE DIAGNOSIS:  Right C8 perineural cyst with rapid cerebrospinal  fluid filling.   POSTOPERATIVE DIAGNOSIS:  Right C8 perineural cyst with rapid cerebrospinal  fluid filling.   PROCEDURE:  Placement lumboperitoneal shunt for cerebrospinal fluid  diversion away from perineural cyst.   SURGEON:  Donalee Citrin, M.D.   ASSISTANT:  Sherilyn Cooter A. Pool, M.D.   ANESTHESIA:  General endotracheal anesthesia.   HISTORY OF PRESENT ILLNESS:  The patient is a very pleasant 45 year old  female who has had long-standing problems with a C8 perineural cyst.  She  had undergone foraminotomy with some improvement, however, progressive  worsening arm pain and postural headaches.  These did improve with lumbar  drainage.  The patient subsequently underwent lumboperitoneal shunt  placement, started developing intractable lumbar radiculopathy with  lumboperitoneal shunt and asked for the shunt to be removed.  The shunt was  removed, however, over the last several weeks and months, she has had  progressively worsening postural headaches and right arm pain due to rapid  filling of this C8 perineural cyst with CSF.  Workup and myelography showed  no change in the anatomy, however, rapid filling of the cyst with  myelographic dye.  Due to failure of all forms of conservative treatment,  the patient has been recommended replacement of lumboperitoneal shunt at a  different location to reattempt the diversion of the spinal fluid away from  the cyst.  The risks and benefits were discussed with the patient.   DESCRIPTION  OF PROCEDURE:  The patient was brought to the operating room,  induced under general anesthesia, positioned prone on rolls and a bean bag.  Her back was prepped and draped in the usual sterile fashion after  interoperative x-ray with C-arm localized a level at approximately L3-L4.  After infiltration with anesthesia with lidocaine, a small midline incision  was made and a Tuohey needle was used to pass subfascially in the CSF space  at L3-L4.  CSF drainage was obtained, however, I was not able to thread the  lumboperitoneal shunt.  Attention was turned to the threaded wire through  the shunt and this was placed through the catheter, however, on removal of  the wire part of the shunt, the catheter was sheared and it was not draining  CSF.  So, this whole system was removed with needle and all.  The shunt  catheter tip was noted to be partially sheared, although it was split  thickness and was not  the entire thing, so the catheter came out intact.   Redirection was taken one level above at L2-L3 with placement of the  catheter at this level.  Brisk CSF flow was obtained.  The new shunt system  using the Codman LP shunt system was easily passed and then the Tuohey  needle was removed.  Then, this was tunneled through an incision on her  flank made with a 10 blade scalpel and the shunt catheter was coiled up in  this flank incision.  The back in was closed with interrupted Vicryls and  staples.  Then, the wound was dressed.  The flank incision was closed with  just temporarily oversewing it with Vicryl and then the patient was  repositioned in the lateral decubitus for placement of the peritoneal  portion.  After drapes were removed, the patient was repositioned, reprepped  and draped with a subcostal incision prepped out.  The old flank incision  was opened up, the catheter was removed and wrapped in an antibiotic soaked  sponge.  During the back incision and placement of catheter, Omnipaque  dye  was inserted  through the catheter system to confirm subarachnoid placement  and brisk CSF flow was obtained with aspiration. After the flank incision  was opened up, a subcostal incision was made, the subcutaneous tissue  dissection was carried out bluntly with hemostats and Metzenbaum scissors.  The external and internal oblique muscles were identified and divided.  The  peritoneum was identified, grasped with hemostats, a small incision was made  in the middle, then intraperitoneal space was discovered.  Hemostats were  used to clamp off the peritoneum at this location.  Then, using a shunt  passer, the catheter was passed from the flank incision over to the  abdominal incision and placed through this defect in the peritoneum.  Again,  prior to placement, brisk CSF flow was obtained through the catheter system  and the catheter tip was placed in the belly.  Then, a purse-string with  chromic was sutured around the peritoneal defect.  The external oblique  fascia was reapproximated, the subcutaneous tissues were closed with 2-0  interrupted Vicryl and the skin was closed with running 4-0 subcuticular.  Again, subdermal and subcuticular was also placed in the flank incision.  Both wounds were dressed.  The patient went to the recovery room in stable  condition.  At the end of the case, sponge and needle counts were correct.                                               Donalee Citrin, M.D.    GC/MEDQ  D:  12/24/2003  T:  12/24/2003  Job:  [id number]

## 2010-10-03 NOTE — Op Note (Signed)
NAMEHARTLEY, Katrina Fernandez            ACCOUNT NO.:  [id number]   MEDICAL RECORD NO.:  [id number]          PATIENT TYPE:  AMB   LOCATION:  SDS                          FACILITY:  [facility]   PHYSICIAN:  Donalee Citrin, M.D.        DATE OF BIRTH:  1966-03-20   DATE OF PROCEDURE:  05/20/2005  DATE OF DISCHARGE:                                 OPERATIVE REPORT   PREOPERATIVE DIAGNOSES:  Lumbar spinal stenosis and lateral recess stenosis,  L4-L5 and L5-S1 with a central disk herniation at L5-S1 with right-sided L5  and S1 radiculopathy.   POSTOPERATIVE DIAGNOSES:  Lumbar spinal stenosis and lateral recess  stenosis, L4-L5 and L5-S1 with a central disk herniation L5-S1 with right-  sided L5 and S1 radiculopathy.   PROCEDURE:  Lumbar laminectomy at L4-L5 with microscopic foraminotomy at the  L5 nerve root on the right and lumbar microdiskectomy L5-S1 with microscopic  facetectomy and microscopic dissection of the right S1 nerve root.   SURGEON:  Donalee Citrin, M.D.   ASSISTANT:  Tia Alert, M.D.   ANESTHESIA:  General endotracheal.   HISTORY OF PRESENT ILLNESS:  The patient is a very pleasant 45 year old  female who has had long-standing back and predominantly right leg pain  radiating down to the foot and big toe consistent with an L5 radicular  pattern.  Previously, she had lateral recess stenosis at L4-L5 as well as a  central disk herniation at L5-S1, causing stenosis on the right S1 nerve  root and stenosis of the right S1 neural foramina.  The patient failed all  measures of conservative treatment with physical therapy, steroid  injections, and nerve blocks but she did get temporary relief from the nerve  block, so it was felt that the patient would benefit from decompression.  The risks and benefits were discussed and informed consent obtained.   DESCRIPTION OF PROCEDURE:  The patient was brought to the OR and was induced  under general anesthesia.  The back was prepped and draped in the  usual  sterile fashion, localizing the L4-L5 disk space and a midline incision made  after the infiltration of 10 cc of lidocaine with epinephrine.  Bovie  electrocautery was used to down the subcutaneous tissues and subperiosteal  dissection was carried out to the lamina of L4-L5 and S1 on the right side.  __________ of the L5-S1 disk space on the right so the inferior aspect of L4  __________ the superior aspect of the lamina of L5, as well as the inferior  aspect of the __________ S1 was removed in a piecemeal fashion with  __________  and Kerrison punch.  The ligamentum flavum was identified at  both levels and removed in a piecemeal fashion to expose the thecal sac.  At  this point, the operating microscope was draped and brought onto the field  __________  lateral gutter __________  first exposing the S1 nerve root on  the right, then the S1 neural foramen was opened up and unroofed, and  mobilized off a large bulbous disk fragment predominantly central but also  causing rightward compression of the  S1 nerve root.  An annulotomy was made  here with an 11 blade scalpel and pituitary rongeurs were used to remove a  vast majority of the annulus and a large central fragment was removed with  these.  __________  pituitary rongeurs were used to clean up the disk space  and there was no further stenosis at this level.  Then attention was paid to  L4-L5 and again ligament was removed in a piecemeal fashion, exposing the  thecal sac and proximal right L5 nerve root.  The L5 pedicle was identified  and the L5 neural foramen was opened up at this level.  Then, the disk space  was inspected and it was felt to be not causing significant compression so  it was decided not to enter this disk space.  The L5 neural foramen easily  accepted a hockey stick and __________  dilator, confirming patency and no  further stenosis on the L5 root or the S1 nerve root.  So, after copious  irrigation and  meticulous hemostasis was maintained, Gelfoam was __________  and the muscle and fascia were reapproximated in layers with interrupted  Vicryl and the skin with a running 4-0 subcuticular and Benzoin and Steri-  Strips were applied.  The patient went to the recovery room in stable  condition.           ______________________________  Donalee Citrin, M.D.     GC/MEDQ  D:  05/20/2005  T:  05/20/2005  Job:  [id number]

## 2011-02-26 LAB — BASIC METABOLIC PANEL
BUN: 6
CO2: 33 — ABNORMAL HIGH
Calcium: 8.2 — ABNORMAL LOW
Chloride: 96
Creatinine, Ser: 0.65
GFR calc Af Amer: >60
GFR calc non Af Amer: >60
Glucose, Bld: 93
Potassium: 3.2 — ABNORMAL LOW
Sodium: 136

## 2011-02-26 LAB — WOUND CULTURE
Culture: NO GROWTH
Culture: NO GROWTH
Culture: NO GROWTH
Gram Stain: NONE SEEN

## 2011-02-26 LAB — ANAEROBIC CULTURE

## 2011-02-26 LAB — CBC
HCT: 34.7 — ABNORMAL LOW
Hemoglobin: 11.5 — ABNORMAL LOW
MCHC: 33.3
MCV: 87.4
Platelets: 317
RBC: 3.97
RDW: 13.4
WBC: 10.9 — ABNORMAL HIGH

## 2011-03-02 LAB — CBC
HCT: 39.1
Hemoglobin: 13.2
MCHC: 33.8
MCV: 91.3
Platelets: 230
RBC: 4.28
RDW: 12.9
WBC: 6.2

## 2011-03-02 LAB — TYPE AND SCREEN
ABO/RH(D): AB POS
Antibody Screen: NEGATIVE

## 2011-03-02 LAB — ABO/RH: ABO/RH(D): AB POS

## 2011-08-13 DIAGNOSIS — R1310 Dysphagia, unspecified: Secondary | ICD-10-CM | POA: Insufficient documentation

## 2011-08-26 DIAGNOSIS — K9509 Other complications of gastric band procedure: Secondary | ICD-10-CM | POA: Insufficient documentation

## 2012-01-28 ENCOUNTER — Encounter: Payer: Self-pay | Admitting: Family Medicine

## 2012-01-28 ENCOUNTER — Ambulatory Visit (INDEPENDENT_AMBULATORY_CARE_PROVIDER_SITE_OTHER): Payer: Medicare Other | Admitting: Family Medicine

## 2012-01-28 VITALS — BP 120/71 | HR 105 | Ht 63.75 in | Wt 192.0 lb

## 2012-01-28 DIAGNOSIS — S93331A Other subluxation of right foot, initial encounter: Secondary | ICD-10-CM | POA: Insufficient documentation

## 2012-01-28 DIAGNOSIS — M25569 Pain in unspecified knee: Secondary | ICD-10-CM

## 2012-01-28 DIAGNOSIS — K219 Gastro-esophageal reflux disease without esophagitis: Secondary | ICD-10-CM | POA: Insufficient documentation

## 2012-01-28 DIAGNOSIS — S9304XA Dislocation of right ankle joint, initial encounter: Secondary | ICD-10-CM | POA: Insufficient documentation

## 2012-01-28 DIAGNOSIS — F3289 Other specified depressive episodes: Secondary | ICD-10-CM

## 2012-01-28 DIAGNOSIS — F32A Depression, unspecified: Secondary | ICD-10-CM

## 2012-01-28 DIAGNOSIS — M25561 Pain in right knee: Secondary | ICD-10-CM

## 2012-01-28 DIAGNOSIS — F329 Major depressive disorder, single episode, unspecified: Secondary | ICD-10-CM | POA: Insufficient documentation

## 2012-01-28 MED ORDER — DULOXETINE HCL 60 MG PO CPEP: ORAL_CAPSULE | ORAL | Status: DC

## 2012-01-28 NOTE — Progress Notes (Signed)
CC: Katrina Fernandez is a 46 y.o. female is here for Establish Care, Anxiety, Depression and Knee Pain   Subjective: HPI:  Pleasant 46 year old here to establish care for the past medical history is of substantial back surgeries who is currently on opiates from a local pain clinic.  She complains of depression that has been present ever since her 48s and had been successfully controlled on Cymbalta 90 mg on a daily basis however her financial situation has kept her from being able to take this for the past week. Since cessation she complains of crying spells, short fuse, trouble controlling her temper, and thoughts that she may want to hurt herself because "it is getting so bad ". She states when which she was on Cymbalta life was fantastic and she didn't feel a bit of depression. She has tried Zoloft, Prozac, Lexapro, Effexor, Seroquel all of which did not provide any benefit.  She denies hallucinations, obsessive thoughts, paranoia, nor mood swings. She had seeing a behavior health specialist in the past but said she was doing so well on Cymbalta she has not had any behavioral therapy in the recent past. She denies any known history of thyroid problems personally or within family.  She claims of right knee pain is been present for matter of 2 years that stems from a fall while walking off of a bus on her sons field trip. She believes that she's had x-rays and even an MRI of the knee that were unremarkable. She's concerned today due to increasing pain and also admits to locking and giving way that's been more frequent within the last 4 days. Pain is worse when she crosses her leg and improves when she's not moving it or if she is nonweightbearing. She has a passion for cycling has been unable to do so due to pain that is experienced after cycling even though actively cycling is not painful. She describes intermittent swelling and redness of the joint without heat. Pain improves with 4 morphing regimen  for her chronic back pain. She also uses Voltaren as minimal relief. Pain is localized to the lateral and medial joint lines often she has posterior knee pain that will radiate down the back of her calf. She denies any motor sensory disturbances, skin changes, claudication.  She denies fevers, chills, chest pain, respiratory complaints, genitourinary complaints, nor history of swollen lymph nodes or easy bruisability.   Review Of Systems Outlined In HPI  Past Medical History  Diagnosis Date  . Hyperlipidemia   . Spinal stenosis   . GERD (gastroesophageal reflux disease) 01/28/2012  . Right ankle pain 01/28/2012  . Depression 01/28/2012     Family History  Problem Relation Age of Onset  . Depression    . Colon cancer Father   . Ovarian cancer Maternal Grandmother   . Pancreatic cancer Maternal Grandfather      History  Substance Use Topics  . Smoking status: Never Smoker   . Smokeless tobacco: Not on file  . Alcohol Use: No     Objective: Filed Vitals:   01/28/12 1354  BP: 120/71  Pulse: 105   GAD7 score of 13 with anxiety relaxation issues and irritability score and 3  THQ9 score of 21 rows 2 through 6 scoring 3  General: Alert and Oriented, No Acute Distress HEENT: Pupils equal, round, reactive to light. Conjunctivae clear.   Neck supple without palpable lymphadenopathy nor abnormal masses. Lungs: Clear to auscultation bilaterally, no wheezing/ronchi/rales.  Comfortable work of breathing. Good  air movement. Cardiac: Regular rate and rhythm. Normal S1/S2.  No murmurs, rubs, nor gallops.   Extremities: Trace nonpitting lower extremity edema bilaterally. Full motion and strength in the right lower extremity. Pain is reproduced with palpation of lateral and medial joint line. Valgus varus stress does not bring on pain. Anterior drawer is not bring on pain nor laxity present. Patellar rocking does not bring on pain. Log roll unremarkable. No posterior knee pain with palpation.  McMurray positive.  Strong peripheral pulses.  Mental Status: No depression, anxiety, nor agitation. Skin: Warm and dry.  Assessment & Plan: Katrina Fernandez was seen today for establish care, anxiety, depression and knee pain.  Diagnoses and associated orders for this visit:  Right knee pain - MR Knee Right Wo Contrast; Future  Depression  Samples of Celexa and the patient, she'll return in 3 weeks we'll discuss possibly switching over to citalopram as she does not believe that she's ever tried this medication. She verbally contracted for safety today and will be under the care of her husband per her report. Her right knee pain is suspicious for meniscal pathology therefore MRI has been ordered. She is open to surgical intervention if warranted. I've asked her to return in 3 weeks.    Return in about 3 days (around 01/31/2012).  Requested Prescriptions   Signed Prescriptions Disp Refills  . DULoxetine (CYMBALTA) 60 MG capsule 30 capsule 2    Sig: 90mg  daily

## 2012-02-01 ENCOUNTER — Telehealth: Payer: Self-pay | Admitting: *Deleted

## 2012-02-01 NOTE — Telephone Encounter (Signed)
PA obtained for MR Knee Right wo contrast. PA # is [id number]. Good through 03-17-12.

## 2012-02-03 ENCOUNTER — Ambulatory Visit (HOSPITAL_COMMUNITY): Payer: Self-pay | Admitting: Psychiatry

## 2012-02-06 ENCOUNTER — Ambulatory Visit (HOSPITAL_BASED_OUTPATIENT_CLINIC_OR_DEPARTMENT_OTHER)
Admission: RE | Admit: 2012-02-06 | Discharge: 2012-02-06 | Disposition: A | Discharge status: Home / Self Care | Payer: Medicare Other | Source: Ambulatory Visit | Attending: Family Medicine | Admitting: Family Medicine

## 2012-02-06 DIAGNOSIS — M25561 Pain in right knee: Secondary | ICD-10-CM

## 2012-02-06 DIAGNOSIS — M25569 Pain in unspecified knee: Secondary | ICD-10-CM | POA: Insufficient documentation

## 2012-02-06 DIAGNOSIS — M25469 Effusion, unspecified knee: Secondary | ICD-10-CM | POA: Insufficient documentation

## 2012-02-06 DIAGNOSIS — M7120 Synovial cyst of popliteal space [Baker], unspecified knee: Secondary | ICD-10-CM | POA: Insufficient documentation

## 2012-02-08 ENCOUNTER — Encounter: Payer: Self-pay | Admitting: Family Medicine

## 2012-02-08 DIAGNOSIS — M7120 Synovial cyst of popliteal space [Baker], unspecified knee: Secondary | ICD-10-CM

## 2012-02-08 DIAGNOSIS — S83206A Unspecified tear of unspecified meniscus, current injury, right knee, initial encounter: Secondary | ICD-10-CM

## 2012-02-08 DIAGNOSIS — M170 Bilateral primary osteoarthritis of knee: Secondary | ICD-10-CM | POA: Insufficient documentation

## 2012-02-09 ENCOUNTER — Encounter: Payer: Self-pay | Admitting: *Deleted

## 2012-02-09 ENCOUNTER — Ambulatory Visit (INDEPENDENT_AMBULATORY_CARE_PROVIDER_SITE_OTHER): Payer: Medicare Other | Admitting: Sports Medicine

## 2012-02-09 DIAGNOSIS — S83206A Unspecified tear of unspecified meniscus, current injury, right knee, initial encounter: Secondary | ICD-10-CM

## 2012-02-09 DIAGNOSIS — M1710 Unilateral primary osteoarthritis, unspecified knee: Secondary | ICD-10-CM

## 2012-02-09 DIAGNOSIS — IMO0002 Reserved for concepts with insufficient information to code with codable children: Secondary | ICD-10-CM

## 2012-02-09 NOTE — Progress Notes (Signed)
Patient ID: Katrina Fernandez, female   DOB: 1966-05-16, 46 y.o.   MRN: [id number] Subjective:    I'm seeing this patient as a consultation for: Dr. Ivan Anchors   CC: Right knee pain  HPI: Katrina Fernandez is a very pleasant 46 year old female, on fortunately a couple years ago a lot of school bus, impacting her right knee. She's had pain since then and she localizes along the anterior knee just distal to the patella. The pain is bad when going up stairs, and keeping her needed for long periods of time. She notes some popping, and occasional swelling. Occasionally he does buckle as well, but has never locked in flexion. She currently takes morphine, has declined other NSAIDs in the past. She's never had an injection in her knee.  Past medical history, Surgical history, Family history, Social history, Allergies, and medications have been entered into the medical record, reviewed, and no changes needed.   Review of Systems: No headache, visual changes, nausea, vomiting, diarrhea, constipation, dizziness, abdominal pain, skin rash, fevers, chills, night sweats, weight loss, body aches, joint swelling, muscle aches, chest pain, or shortness of breath.   Objective:   Vitals:  Afebrile, vital signs stable. General: Well Developed, well nourished, and in no acute distress.  Neuro/Psych: Alert and oriented x3, extra-ocular muscles intact, able to move all 4 extremities.  Skin: Warm and dry, no rashes noted.  Respiratory: Not using accessory muscles, speaking in full sentences, trachea midline.  Cardiovascular: Pulses palpable, no extremity edema. Abdomen: Does not appear distended. Right Knee: Normal to inspection with no erythema or effusion or obvious bony abnormalities. Tender to palpation along medial, and lateral patellar facets, as well as medial joint line. ROM full in flexion and extension and lower leg rotation. Ligaments with solid consistent endpoints including ACL, PCL, LCL, MCL. Negative Mcmurray's,  Apley's, and Thessalonian tests. Painful patellar compression. Patellar glide with crepitus Patellar and quadriceps tendons unremarkable. Hamstring and quadriceps strength is normal.   Procedure: Real-time Ultrasound Guided Injection of right knee Device: GE Logiq E  Ultrasound guided injection is preferred based studies that show increased duration, increased effect, greater accuracy, decreased procedural pain, increased response rate, and decreased cost with ultrasound guided versus blind injection.  Verbal informed consent obtained.  Time-out conducted.  Noted no overlying erythema, induration, or other signs of local infection.  Skin prepped in a sterile fashion.  Local anesthesia: Topical Ethyl chloride.  With sterile technique and under real time ultrasound guidance:  25-gauge needle advanced into the suprapatellar recess, 2 cc Kenalog 40, 4 cc lidocaine injected easily. Completed without difficulty  Pain immediately resolved suggesting accurate placement of the medication.  Advised to call if fevers/chills, erythema, induration, drainage, or persistent bleeding.  Images permanently stored and available for review in the ultrasound unit.  Impression: Technically successful ultrasound guided injection.   Impression and Recommendations:   This case required medical decision making of moderate complexity.

## 2012-02-09 NOTE — Assessment & Plan Note (Addendum)
Ultrasound guided injection as above. Formal physical therapy for VMO strengthening, and patellofemoral pain syndrome. Knee compression sleeve. I will see her back in 4 weeks, and if no better we will consider Visco supplementation

## 2012-02-10 ENCOUNTER — Encounter (HOSPITAL_COMMUNITY): Payer: Self-pay | Admitting: Psychiatry

## 2012-02-11 ENCOUNTER — Encounter (HOSPITAL_COMMUNITY): Payer: Self-pay | Admitting: Psychiatry

## 2012-02-12 ENCOUNTER — Ambulatory Visit: Payer: Medicare Other | Admitting: Physical Therapy

## 2012-02-17 ENCOUNTER — Encounter: Payer: Self-pay | Admitting: Family Medicine

## 2012-02-17 ENCOUNTER — Ambulatory Visit (INDEPENDENT_AMBULATORY_CARE_PROVIDER_SITE_OTHER): Payer: Medicare Other | Admitting: Family Medicine

## 2012-02-17 VITALS — BP 120/80 | HR 93 | Wt 191.0 lb

## 2012-02-17 DIAGNOSIS — F419 Anxiety disorder, unspecified: Secondary | ICD-10-CM

## 2012-02-17 DIAGNOSIS — F411 Generalized anxiety disorder: Secondary | ICD-10-CM

## 2012-02-17 MED ORDER — CITALOPRAM HYDROBROMIDE 40 MG PO TABS: ORAL_TABLET | ORAL | Status: DC

## 2012-02-17 NOTE — Patient Instructions (Addendum)
Days 1-5: Cymbalta 60mg  daily and Citalopram 20mg  (1/2) pill daily. Days 6-8 Cymbalta 30mg  daily land Citalopram 40mg  (full) pill dialy.  Days 9+: No Cymbalta, Continue Citalopram 40mg  pill daily.

## 2012-02-17 NOTE — Progress Notes (Signed)
CC: Katrina Fernandez is a 46 y.o. female is here for Depression   Subjective: HPI:  Patient presents for followup of anxiety, she is having financial difficulties pain for Cymbalta and she was given a five week regimen samples at her last visit. She feels that her anxiety and depression are greatly controlled on Cymbalta, at her former physicians office she was given samples and she left because they were no longer providing her samples and she for which is no longer able to afford Cymbalta. They did last visit would be that she would get back on her Cymbalta and she returned today for discussion of switching her to another SSRI or snri. She has tried a multitude of these medications in the past with troubles including GI intolerances and peripheral edema there were unbearable to her. Since I saw her last she reports using Klonopin 3 times for panic attacks and reports in the past year she rarely ever takes his medication. She feels her anxiety and depression overall are well controlled.  She expresses an interest in transitioning to citalopram as she's never tried this medication before to her knowledge and no longer financially afford Cymbalta.   Review Of Systems Outlined In HPI  Past Medical History  Diagnosis Date  . Hyperlipidemia   . Spinal stenosis   . GERD (gastroesophageal reflux disease) 01/28/2012  . Right ankle pain 01/28/2012  . Depression 01/28/2012     Family History  Problem Relation Age of Onset  . Depression    . Colon cancer Father   . Ovarian cancer Maternal Grandmother   . Pancreatic cancer Maternal Grandfather      History  Substance Use Topics  . Smoking status: Never Smoker   . Smokeless tobacco: Not on file  . Alcohol Use: No     Objective: Filed Vitals:   02/17/12 0923  BP: 120/80  Pulse: 93    General: Alert and Oriented, No Acute Distress HEENT: Pupils equal, round, reactive to light. Conjunctivae clear.   Moist mucous membranes Lungs: Clear  Comfortable work of breathing.  Cardiac: Regular rate and rhythm.  Extremities: No peripheral edema.  Mental Status: No depression, anxiety, nor agitation. Skin: Warm and dry.  Assessment & Plan: Katrina Fernandez was seen today for depression.  Diagnoses and associated orders for this visit:  Anxiety - citalopram (CELEXA) 40 MG tablet; Take one daily after going through taper plan discussed on your visit from 02/17/12.  Please refer to patient instructions, we formulated a taper down for Cymbalta and taper up for citalopram. I've asked her to return in approximately 2 weeks following the completion of her transition to determine if citalopram provides appropriate symptom control.   She'll be transitioning some medications to my care of note I told her that I do not typically prescribed Klonopin on a chronic basis if she needs it more often than sparingly we'll ask for help from behavioral health professional.  She is on statin and she's unsure of last time she had a cholesterol panel checked, I've asked her to ask her old physician's office for records to see she's due for blood work.   Return in about 2 weeks (around 03/02/2012).

## 2012-02-18 ENCOUNTER — Ambulatory Visit: Payer: Medicare Other | Admitting: Family Medicine

## 2012-03-04 ENCOUNTER — Encounter: Payer: Self-pay | Admitting: Family Medicine

## 2012-03-04 ENCOUNTER — Ambulatory Visit (INDEPENDENT_AMBULATORY_CARE_PROVIDER_SITE_OTHER): Payer: Medicare Other | Admitting: Family Medicine

## 2012-03-04 ENCOUNTER — Ambulatory Visit (HOSPITAL_BASED_OUTPATIENT_CLINIC_OR_DEPARTMENT_OTHER)
Admission: RE | Admit: 2012-03-04 | Discharge: 2012-03-04 | Disposition: A | Discharge status: Home / Self Care | Payer: Medicare Other | Source: Ambulatory Visit | Attending: Family Medicine | Admitting: Family Medicine

## 2012-03-04 VITALS — BP 107/75 | HR 84 | Ht 63.75 in | Wt 194.0 lb

## 2012-03-04 DIAGNOSIS — M79609 Pain in unspecified limb: Secondary | ICD-10-CM

## 2012-03-04 DIAGNOSIS — F329 Major depressive disorder, single episode, unspecified: Secondary | ICD-10-CM

## 2012-03-04 DIAGNOSIS — F32A Depression, unspecified: Secondary | ICD-10-CM

## 2012-03-04 DIAGNOSIS — M79662 Pain in left lower leg: Secondary | ICD-10-CM

## 2012-03-04 DIAGNOSIS — I82890 Acute embolism and thrombosis of other specified veins: Secondary | ICD-10-CM

## 2012-03-04 DIAGNOSIS — F3289 Other specified depressive episodes: Secondary | ICD-10-CM

## 2012-03-04 NOTE — Progress Notes (Signed)
CC: Katrina Fernandez is a 46 y.o. female is here for left calf pain   Subjective: HPI:  Patient presents with acute complaint of left calf pain that's been present since earlier this week which came on suddenly persistent. Is described as sharp and nonradiating. She initially thought it was somehow associated with her chronic left hip pain however the character of the calf pain is concerning different.  She feels there may be some calf swelling since the onset however she's overall unsure. The pain is in the posterior of the calf feels "deep" is nonradiating and is worse when walking or standing up. She's never had this pain before. It can be present 24 hours a day but she notes it does not wake her from sleep. She denies any discoloration or architectural changes in the left leg. She denies weakness, nor motor or sensory disturbances in the left leg. There's been no trauma to the left leg. She takes estrogen, she is a nonsmoker, she does not have any prolonged immobilization but when asked about this she does grow concerned when mentioning that she usually stays in bed for long periods of time during the day. She believes she is up-to-date on cancer preventative services. She's never had a blood clot before but her father has had "many".  The only change in lifestyle and medications include switching from Cymbalta to citalopram for depression. This which occurred approximately one 2 weeks ago and she states that her mood is "good and stable ". She denies any change or disturbance in her mood, denying depression .or anxiety. She denies muscle aches elsewhere in the body, fevers, mental disturbance, muscle spasms, nor confusion.   Review Of Systems Outlined In HPI  Past Medical History  Diagnosis Date  . Hyperlipidemia   . Spinal stenosis   . GERD (gastroesophageal reflux disease) 01/28/2012  . Right ankle pain 01/28/2012  . Depression 01/28/2012     Family History  Problem Relation Age of Onset    . Depression    . Colon cancer Father   . Ovarian cancer Maternal Grandmother   . Pancreatic cancer Maternal Grandfather      History  Substance Use Topics  . Smoking status: Never Smoker   . Smokeless tobacco: Not on file  . Alcohol Use: No     Objective: Filed Vitals:   03/04/12 0907  BP: 107/75  Pulse: 84    General: Alert and Oriented, No Acute Distress Lungs:  Comfortable work of breathing. Good air movement. Cardiac: Regular rate and rhythm.  Abdomen:  soft nontender  Extremities:  strong peripheral pulses in the lower extremities. 1+ nonpitting edema in both legs slightly greater on the left. Circumference of the left ankle is approximately 1 cm greater in the right and circumference of the left midcalf is approximately 1 cm greater than the right. There is no discoloration of the left extremity other than a slight purple hue  of varicose veins overlying the site of her pain on the left calf.  Mental Status: No depression, anxiety, nor agitation. Skin: Warm and dry.  Assessment & Plan: Rivky was seen today for left calf pain.  Diagnoses and associated orders for this visit:  Pain of left calf - US Venous Img Lower Unilateral Left; Future  Depression    Her Wells score is 2 if you consider her relatively long hours spent in the bed every day.  She's somewhat concerned that she might have a blood clot. We've arranged Doppler studies this  afternoon to rule out DVT most likely secondary diagnoses would be symptomatic varicose veins. Will call her at the below number with results [phone]   depression appears stable after the switch to citalopram, entertained the idea of hypo-natremia or serotonin syndrome causing her discomfort however I would expect more systemic manifestations as well.

## 2012-03-04 NOTE — Progress Notes (Signed)
Spoke with patient regarding thrombus noted within a segment of or contributory to the left peroneal vein, no deep vein thrombosis noted on the ultrasound. I've asked her to apply warm compresses 30 minutes 4 times a day to the site of discomfort, to keep the leg elevated, and to use ibuprofen 600 mg every 8 hours, I've given her the phone number of a surgical supply company and hopes that she'll get compression stockings to the left leg, 20-30 mm mercury, to be worn during waking hours. Asked her to consider stopping estrogen in the near future. Signs and symptoms of a DVT were discussed in detail, if they're not apparent between now and Wednesday or Thursday when she can get another appointment with me we will consider a repeat ultrasound.

## 2012-03-08 ENCOUNTER — Ambulatory Visit (INDEPENDENT_AMBULATORY_CARE_PROVIDER_SITE_OTHER): Payer: Medicare Other | Admitting: Sports Medicine

## 2012-03-08 ENCOUNTER — Encounter: Payer: Self-pay | Admitting: Sports Medicine

## 2012-03-08 VITALS — BP 95/63 | HR 83 | Temp 98.0°F | Resp 17 | Wt 195.0 lb

## 2012-03-08 DIAGNOSIS — M25476 Effusion, unspecified foot: Secondary | ICD-10-CM

## 2012-03-08 DIAGNOSIS — S83206A Unspecified tear of unspecified meniscus, current injury, right knee, initial encounter: Secondary | ICD-10-CM

## 2012-03-08 DIAGNOSIS — M1711 Unilateral primary osteoarthritis, right knee: Secondary | ICD-10-CM

## 2012-03-08 DIAGNOSIS — M1710 Unilateral primary osteoarthritis, unspecified knee: Secondary | ICD-10-CM

## 2012-03-08 DIAGNOSIS — IMO0002 Reserved for concepts with insufficient information to code with codable children: Secondary | ICD-10-CM

## 2012-03-08 DIAGNOSIS — M25473 Effusion, unspecified ankle: Secondary | ICD-10-CM | POA: Insufficient documentation

## 2012-03-08 NOTE — Progress Notes (Signed)
Subjective:    CC: Followup right knee pain  HPI:  [name] comes back, she had an intra-articular injection of steroid 4 weeks ago for MRI confirmed tricompartmental DJD of the right knee and she noted some benefit initially after the shot, but this benefit has since gone away.    Bilateral ankle swelling:  Present for some time now, she did recently see Dr. Ivan Anchors, and was diagnosed with a superficial venous thrombosis. She is now complaining of bilateral ankle swelling that is fairly painful.   Past medical history, Surgical history, Family history, Social history, Allergies, and medications have been entered into the medical record, reviewed, and no changes needed.   Review of Systems: No headache, visual changes, nausea, vomiting, diarrhea, constipation, dizziness, abdominal pain, skin rash, fevers, chills, night sweats, weight loss, swollen lymph nodes, body aches, joint swelling, muscle aches, chest pain, or shortness of breath.   Objective:   Vitals:  Afebrile, vital signs stable. General: Well Developed, well nourished, and in no acute distress.  Neuro/Psych: Alert and oriented x3, extra-ocular muscles intact, able to move all 4 extremities.  Skin: Warm and dry, no rashes noted.  Respiratory: Not using accessory muscles, speaking in full sentences, trachea midline.  Cardiovascular: Pulses palpable, no extremity edema. Abdomen: Does not appear distended. Both ankles show edema, that is moderately pitting 2+.   Procedure: Real-time Ultrasound Guided Injection of right knee Device: GE Logiq E  Ultrasound guided injection is preferred based studies that show increased duration, increased effect, greater accuracy, decreased procedural pain, increased response rate, and decreased cost with ultrasound guided versus blind injection.  Verbal informed consent obtained.  Time-out conducted.  Noted no overlying erythema, induration, or other signs of local infection.  Skin prepped in a  sterile fashion.  Local anesthesia: Topical Ethyl chloride.  With sterile technique and under real time ultrasound guidance:  25 mg/2.5 mL of Supartz (sodium hyaluronate) in a prefilled syringe was injected easily into the knee through a 22-gauge needle. Completed without difficulty  Pain immediately resolved suggesting accurate placement of the medication.  Advised to call if fevers/chills, erythema, induration, drainage, or persistent bleeding.  Images permanently stored and available for review in the ultrasound unit.  Impression: Technically successful ultrasound guided injection.  Impression and Recommendations:   This case required medical decision making of moderate complexity.

## 2012-03-08 NOTE — Assessment & Plan Note (Signed)
She has a superficial venous thrombosis, however this would not cause bilateral symptoms. We'll go ahead and check a urinalysis, renal function panel, and BNP. She also needs to get compression hose. Dr. Ivan Anchors will continue further workup for her ankle swelling.

## 2012-03-08 NOTE — Assessment & Plan Note (Signed)
Failed steroid injection. Tricompartmental DJD. Supartz injection given today. She will come back in one week for #2 of 5.

## 2012-03-09 ENCOUNTER — Other Ambulatory Visit: Payer: Self-pay | Admitting: Family Medicine

## 2012-03-09 DIAGNOSIS — I82890 Acute embolism and thrombosis of other specified veins: Secondary | ICD-10-CM

## 2012-03-09 LAB — URINALYSIS, ROUTINE W REFLEX MICROSCOPIC
Glucose, UA: NEGATIVE mg/dL
Hgb urine dipstick: NEGATIVE
Ketones, ur: NEGATIVE mg/dL
Leukocytes, UA: NEGATIVE
Nitrite: NEGATIVE
Protein, ur: NEGATIVE mg/dL
Specific Gravity, Urine: 1.021 (ref 1.005–1.030)
Urobilinogen, UA: 1 mg/dL (ref 0.0–1.0)
pH: 6 (ref 5.0–8.0)

## 2012-03-09 LAB — CBC
HCT: 37.7 % (ref 36.0–46.0)
Hemoglobin: 12.8 g/dL (ref 12.0–15.0)
MCH: 29.4 pg (ref 26.0–34.0)
MCHC: 34 g/dL (ref 30.0–36.0)
MCV: 86.5 fL (ref 78.0–100.0)
Platelets: 269 10*3/uL (ref 150–400)
RBC: 4.36 MIL/uL (ref 3.87–5.11)
RDW: 13.2 % (ref 11.5–15.5)
WBC: 7 10*3/uL (ref 4.0–10.5)

## 2012-03-09 LAB — RENAL FUNCTION PANEL
Albumin: 3.8 g/dL (ref 3.5–5.2)
BUN: 13 mg/dL (ref 6–23)
CO2: 28 mEq/L (ref 19–32)
Calcium: 8.8 mg/dL (ref 8.4–10.5)
Chloride: 107 mEq/L (ref 96–112)
Creat: 0.77 mg/dL (ref 0.50–1.10)
Glucose, Bld: 71 mg/dL (ref 70–99)
Phosphorus: 2.9 mg/dL (ref 2.3–4.6)
Potassium: 4 mEq/L (ref 3.5–5.3)
Sodium: 141 mEq/L (ref 135–145)

## 2012-03-09 LAB — BRAIN NATRIURETIC PEPTIDE: Brain Natriuretic Peptide: 104 pg/mL — ABNORMAL HIGH (ref 0.0–100.0)

## 2012-03-10 ENCOUNTER — Telehealth: Payer: Self-pay | Admitting: Family Medicine

## 2012-03-10 MED ORDER — CLONAZEPAM 0.5 MG PO TABS: 0.5000 mg | ORAL_TABLET | Freq: Every day | ORAL | Status: DC | PRN

## 2012-03-10 NOTE — Telephone Encounter (Signed)
Katrina Fernandez,  Can you please call in this Rx (placed on your desk) to [facility], i got a fax request overnight for klonopin.  Thank you very much,  -Gregary Signs

## 2012-03-10 NOTE — Telephone Encounter (Signed)
Has already been faxed  

## 2012-03-11 ENCOUNTER — Telehealth: Payer: Self-pay | Admitting: Family Medicine

## 2012-03-11 ENCOUNTER — Ambulatory Visit (HOSPITAL_BASED_OUTPATIENT_CLINIC_OR_DEPARTMENT_OTHER)
Admission: RE | Admit: 2012-03-11 | Discharge: 2012-03-11 | Disposition: A | Discharge status: Home / Self Care | Payer: Medicare Other | Source: Ambulatory Visit | Attending: Family Medicine | Admitting: Family Medicine

## 2012-03-11 DIAGNOSIS — I82890 Acute embolism and thrombosis of other specified veins: Secondary | ICD-10-CM | POA: Insufficient documentation

## 2012-03-11 DIAGNOSIS — I8290 Acute embolism and thrombosis of unspecified vein: Secondary | ICD-10-CM

## 2012-03-11 MED ORDER — RIVAROXABAN 15 MG PO TABS: ORAL_TABLET | ORAL | Status: DC

## 2012-03-11 MED ORDER — RIVAROXABAN 20 MG PO TABS: 20.0000 mg | ORAL_TABLET | Freq: Every day | ORAL | Status: DC

## 2012-03-11 NOTE — Telephone Encounter (Signed)
Notify patient of enlarging superficial thrombus, however no evidence of DVT. She confirms that she has been using compression stockings, elevating the extremity and using warm compresses. Given that the above interventions have failed to improve sonographic findings in addition to her report and that she can no longer put on certain shoes because the swelling in the affected leg I have given her the option of either starting Coumadin or xarelto, she prefers the latter. I encouraged her to stop taking estrogen as well. I have asked her to return in 1-2 weeks to ensure no deterioration.

## 2012-03-15 ENCOUNTER — Telehealth: Payer: Self-pay | Admitting: Family Medicine

## 2012-03-15 ENCOUNTER — Encounter: Payer: Self-pay | Admitting: Sports Medicine

## 2012-03-15 ENCOUNTER — Encounter: Payer: Self-pay | Admitting: *Deleted

## 2012-03-15 ENCOUNTER — Ambulatory Visit (INDEPENDENT_AMBULATORY_CARE_PROVIDER_SITE_OTHER): Payer: Medicare Other | Admitting: Sports Medicine

## 2012-03-15 VITALS — BP 116/79 | HR 85 | Wt 192.0 lb

## 2012-03-15 DIAGNOSIS — M1710 Unilateral primary osteoarthritis, unspecified knee: Secondary | ICD-10-CM

## 2012-03-15 DIAGNOSIS — M1711 Unilateral primary osteoarthritis, right knee: Secondary | ICD-10-CM

## 2012-03-15 DIAGNOSIS — IMO0002 Reserved for concepts with insufficient information to code with codable children: Secondary | ICD-10-CM

## 2012-03-15 NOTE — Progress Notes (Signed)
Procedure: Real-time Ultrasound Guided Injection of right knee Device: GE Logiq E  Ultrasound guided injection is preferred based studies that show increased duration, increased effect, greater accuracy, decreased procedural pain, increased response rate, and decreased cost with ultrasound guided versus blind injection.  Verbal informed consent obtained.  Time-out conducted.  Noted no overlying erythema, induration, or other signs of local infection.  Skin prepped in a sterile fashion.  Local anesthesia: Topical Ethyl chloride.  With sterile technique and under real time ultrasound guidance:  25 mg/2.5 mL of Supartz (sodium hyaluronate) in a prefilled syringe was injected easily into the knee through a 22-gauge needle. Completed without difficulty  Pain immediately resolved suggesting accurate placement of the medication.  Advised to call if fevers/chills, erythema, induration, drainage, or persistent bleeding.  Image was not captured for this procedure. Impression: Technically successful ultrasound guided injection. 

## 2012-03-15 NOTE — Telephone Encounter (Signed)
Pt notified in the office today

## 2012-03-15 NOTE — Assessment & Plan Note (Signed)
Supartz #2 of 5 as above. Return in one week for #3

## 2012-03-15 NOTE — Telephone Encounter (Signed)
Xarelto Rx rejected, I've placed an expedited appeal in my out box.  Sue Lush, Will you please let Adriela know that I've received the above rejection and I submitted an appeal this morning.  It may take up to 72 hours until I get a response.  Her insurance is asking [place] to use warfarin instead, but I'd like to give the appeal a shot.  I'll let her know once I hear back from her insurance company.

## 2012-03-18 ENCOUNTER — Telehealth: Payer: Self-pay | Admitting: Family Medicine

## 2012-03-18 MED ORDER — ENOXAPARIN SODIUM 80 MG/0.8ML ~~LOC~~ SOLN: 1.0000 mg/kg | Freq: Two times a day (BID) | SUBCUTANEOUS | Status: DC

## 2012-03-18 MED ORDER — ENOXAPARIN SODIUM 80 MG/0.8ML ~~LOC~~ SOLN: SUBCUTANEOUS | Status: DC

## 2012-03-18 MED ORDER — WARFARIN SODIUM 5 MG PO TABS: 5.0000 mg | ORAL_TABLET | Freq: Every day | ORAL | Status: DC

## 2012-03-18 NOTE — Telephone Encounter (Signed)
Xarelto denied on appeal  Discussed starting lovenox x 5 days, coumadin 3-6 months.  She'll see me next week.

## 2012-03-22 ENCOUNTER — Encounter: Payer: Self-pay | Admitting: *Deleted

## 2012-03-22 ENCOUNTER — Ambulatory Visit (INDEPENDENT_AMBULATORY_CARE_PROVIDER_SITE_OTHER): Payer: Medicare Other | Admitting: Sports Medicine

## 2012-03-22 ENCOUNTER — Encounter: Payer: Self-pay | Admitting: Sports Medicine

## 2012-03-22 VITALS — BP 107/73 | HR 76 | Wt 194.0 lb

## 2012-03-22 DIAGNOSIS — M1711 Unilateral primary osteoarthritis, right knee: Secondary | ICD-10-CM

## 2012-03-22 DIAGNOSIS — IMO0002 Reserved for concepts with insufficient information to code with codable children: Secondary | ICD-10-CM

## 2012-03-22 DIAGNOSIS — M1710 Unilateral primary osteoarthritis, unspecified knee: Secondary | ICD-10-CM

## 2012-03-22 NOTE — Progress Notes (Signed)
Procedure: Real-time Ultrasound Guided Injection of right knee Device: GE Logiq E  Ultrasound guided injection is preferred based studies that show increased duration, increased effect, greater accuracy, decreased procedural pain, increased response rate, and decreased cost with ultrasound guided versus blind injection.  Verbal informed consent obtained.  Time-out conducted.  Noted no overlying erythema, induration, or other signs of local infection.  Skin prepped in a sterile fashion.  Local anesthesia: Topical Ethyl chloride.  With sterile technique and under real time ultrasound guidance:  25 mg/2.5 mL of Supartz (sodium hyaluronate) in a prefilled syringe was injected easily into the knee through a 22-gauge needle. Completed without difficulty  Pain immediately resolved suggesting accurate placement of the medication.  Advised to call if fevers/chills, erythema, induration, drainage, or persistent bleeding.  Image was not captured for this procedure. Impression: Technically successful ultrasound guided injection.

## 2012-03-22 NOTE — Assessment & Plan Note (Signed)
Supartz #3 of 5 as above. Return in one week for #4

## 2012-03-24 ENCOUNTER — Ambulatory Visit (INDEPENDENT_AMBULATORY_CARE_PROVIDER_SITE_OTHER): Payer: Medicare Other | Admitting: Family Medicine

## 2012-03-24 ENCOUNTER — Encounter: Payer: Self-pay | Admitting: Family Medicine

## 2012-03-24 VITALS — BP 113/74 | HR 80 | Wt 196.0 lb

## 2012-03-24 DIAGNOSIS — I82890 Acute embolism and thrombosis of other specified veins: Secondary | ICD-10-CM

## 2012-03-24 DIAGNOSIS — M79609 Pain in unspecified limb: Secondary | ICD-10-CM

## 2012-03-24 DIAGNOSIS — M79669 Pain in unspecified lower leg: Secondary | ICD-10-CM

## 2012-03-24 LAB — POCT INR: INR: 1.4

## 2012-03-24 MED ORDER — WARFARIN SODIUM 1 MG PO TABS: 1.0000 mg | ORAL_TABLET | Freq: Every day | ORAL | Status: DC

## 2012-03-24 NOTE — Progress Notes (Signed)
CC: Katrina Fernandez is a 46 y.o. female is here for f/u blood clot and Coagulation Disorder   Subjective: HPI:  Patient presents for followup of the left superficial vein thrombus that was noted to be enlarging by second ultrasound. We discussed over the phone the risks and benefits of progression of this clot and the use of anticoagulation to possibly hold said progression. She continues to desire and a correlation, she's just finished 5 days of Lovenox and has been taking Coumadin 5 mg daily. She's had some bruising at the site of the injections but no other easy bruisability nor bleeding issues. She still cannot fit into her cowboy boots, she's wearing compression stockings during waking hours. She's concerned because late last week she noticed some right medial proximal calf pain that is getting no better or worse since the onset. It feels identical to the discomfort on the left side that prompted the investigation for clot. She's had some mild swelling in the right ankle and she's unsure if the compression stockings are helping with this. The pain in the right calf is worse with plantar flexion or walking. She denies rapid heartbeat, shortness of breath, discoloration of lower extremities. She has stopped taking her estrogen and is having daily hot flashes.   Review Of Systems Outlined In HPI  Past Medical History  Diagnosis Date  . Hyperlipidemia   . Spinal stenosis   . GERD (gastroesophageal reflux disease) 01/28/2012  . Right ankle pain 01/28/2012  . Depression 01/28/2012     Family History  Problem Relation Age of Onset  . Depression    . Colon cancer Father   . Ovarian cancer Maternal Grandmother   . Pancreatic cancer Maternal Grandfather      History  Substance Use Topics  . Smoking status: Never Smoker   . Smokeless tobacco: Not on file  . Alcohol Use: No     Objective: Filed Vitals:   03/24/12 1002  BP: 113/74  Pulse: 80    General: Alert and Oriented, No Acute  Distress Lungs: Clear comfortable work of breathing  Cardiac: Regular rate and rhythm.  Extremities: 1+ nonpitting edema from the forefoot to the mid calf bilaterally.  Strong peripheral pulses. There is a small palpable cord at the site of her discomfort on the right medial proximal calf , Homan sign is positive on the right Mental Status: No depression, anxiety, nor agitation. Skin: Warm and dry.  Assessment & Plan: Katrina Fernandez was seen today for f/u blood clot and coagulation disorder.  Diagnoses and associated orders for this visit:  Superficial vein thrombosis - POCT INR - warfarin (COUMADIN) 1 MG tablet; Take 1 tablet (1 mg total) by mouth daily.  Calf pain - US Venous Img Lower Unilateral Right; Future    INR 1.4 therefore I like her to go up and her Coumadin to 6 mg daily. We discussed the effect of vitamin K in her diet and how this influences her INR. I like to get a venous ultrasound on the right calf due to the above physical exam and history to rule out a DVT. She is in the process of reaching adequate anticoagulation however if a right DVT were present other pharmaceutical options aside from Coumadin would potentially be available. I like to return on Tuesday for an INR check.  Return in about 5 days (around 03/29/2012).

## 2012-03-24 NOTE — Patient Instructions (Addendum)
New coumadin regimen: 6mg  daily, I'd like to recheck this on Tuesday of next week.  To Help offset Osteoporosis: Postmenopausal women should aim for a daily calcium intake of 1200 mg, however a significant portion of this might already be provided by diets including milk, yogurt, cheese, and other dairy products.  Vitamin D has been shown to help preserve bone density, prevent fatigue, and has even been shown to help reduce falls in the elderly.  Ensuring a daily intake of 800 Units of Vitamin D is a good place to start to enjoy the above benefits.

## 2012-03-28 ENCOUNTER — Ambulatory Visit (HOSPITAL_BASED_OUTPATIENT_CLINIC_OR_DEPARTMENT_OTHER)
Admission: RE | Admit: 2012-03-28 | Discharge: 2012-03-28 | Disposition: A | Discharge status: Home / Self Care | Payer: Medicare Other | Source: Ambulatory Visit | Attending: Family Medicine | Admitting: Family Medicine

## 2012-03-28 DIAGNOSIS — M79669 Pain in unspecified lower leg: Secondary | ICD-10-CM

## 2012-03-28 DIAGNOSIS — M79609 Pain in unspecified limb: Secondary | ICD-10-CM | POA: Insufficient documentation

## 2012-03-29 ENCOUNTER — Telehealth: Payer: Self-pay | Admitting: Family Medicine

## 2012-03-29 ENCOUNTER — Encounter: Payer: Self-pay | Admitting: Sports Medicine

## 2012-03-29 ENCOUNTER — Encounter: Payer: Self-pay | Admitting: *Deleted

## 2012-03-29 ENCOUNTER — Ambulatory Visit (INDEPENDENT_AMBULATORY_CARE_PROVIDER_SITE_OTHER): Payer: Medicare Other | Admitting: Sports Medicine

## 2012-03-29 VITALS — BP 111/78 | HR 83 | Wt 196.0 lb

## 2012-03-29 DIAGNOSIS — I82B19 Acute embolism and thrombosis of unspecified subclavian vein: Secondary | ICD-10-CM

## 2012-03-29 DIAGNOSIS — M1710 Unilateral primary osteoarthritis, unspecified knee: Secondary | ICD-10-CM

## 2012-03-29 DIAGNOSIS — M1711 Unilateral primary osteoarthritis, right knee: Secondary | ICD-10-CM

## 2012-03-29 DIAGNOSIS — IMO0002 Reserved for concepts with insufficient information to code with codable children: Secondary | ICD-10-CM

## 2012-03-29 LAB — POCT INR: INR: 1.7

## 2012-03-29 NOTE — Telephone Encounter (Signed)
Pt notified and voiced understanding of results and instructions

## 2012-03-29 NOTE — Telephone Encounter (Signed)
Katrina Fernandez, Will you please let Mrs. Rhem know that with her INR of 1.7 today I'd like her to increase her coumadin regimen to  Tuesday, Thursday, Saturday, Sunday: 7mg  Monday, Wednesday, Friday: 6mg   I'd like her to have it rechecked on Monday morning next week, it can be a nurse visit or an office visit, it's whatever is most convenient for her.  Thank you.   Diagnosis: Enlarging superficial thrombosis Goal INR range: 2.0-3.0 Current INR: 1.7 Current Coumadin Dose: 6mg  PLAN: Adjust coumadin dose to 7mg  per general guidelines below and recheck INR in 1 week. Recent diet or medication changes: none   INR Dose Adjustment Follow-up INR  1.0-1.3 Increase weekly dose by %20 1 week  1.4-1.6 Increase weekly dose by %15 1 week  1.7-1.9 Increase weekly dose by %7 2 weeks  2.0-3.0 Maintain dose weekly  3.1-3.3 Decrease weekly dose by %15 1 week  3.4-3.6 Decrease weekly dose by %20 1 week  3.7-4.0 Decrease weekly dose by %30 1 week  4.1-5.0 Hold  1 dose then decrease by %50 72 hours  >5.0 Hold until target INR, consider Vit K and work up for coagulation disorders; restart at %50 previous dose INR  q12 to 24 hours until stable

## 2012-03-29 NOTE — Progress Notes (Signed)
Procedure: Real-time Ultrasound Guided Injection of right knee Device: GE Logiq E  Ultrasound guided injection is preferred based studies that show increased duration, increased effect, greater accuracy, decreased procedural pain, increased response rate, and decreased cost with ultrasound guided versus blind injection.  Verbal informed consent obtained.  Time-out conducted.  Noted no overlying erythema, induration, or other signs of local infection.  Skin prepped in a sterile fashion.  Local anesthesia: Topical Ethyl chloride.  With sterile technique and under real time ultrasound guidance: Needle directed and as her patellar recess, 2 cc Kenalog 40, 4 cc lidocaine injected, syringe switched and 25 mg/2.5 mL of Supartz (sodium hyaluronate) in a prefilled syringe was injected easily into the knee through a 22-gauge needle. Completed without difficulty  Pain immediately resolved suggesting accurate placement of the medication.  Advised to call if fevers/chills, erythema, induration, drainage, or persistent bleeding.  Image was not captured for this procedure. Impression: Technically successful ultrasound guided injection.

## 2012-03-29 NOTE — Assessment & Plan Note (Addendum)
Supartz #4 of 5 as above. I placed a small amount steroid in the knee as well. Return in one week for #5

## 2012-04-05 ENCOUNTER — Telehealth: Payer: Self-pay | Admitting: Family Medicine

## 2012-04-05 ENCOUNTER — Encounter: Payer: Self-pay | Admitting: Sports Medicine

## 2012-04-05 ENCOUNTER — Ambulatory Visit (INDEPENDENT_AMBULATORY_CARE_PROVIDER_SITE_OTHER): Payer: Medicare Other | Admitting: Sports Medicine

## 2012-04-05 VITALS — BP 114/78 | HR 80 | Wt 194.0 lb

## 2012-04-05 DIAGNOSIS — IMO0002 Reserved for concepts with insufficient information to code with codable children: Secondary | ICD-10-CM

## 2012-04-05 DIAGNOSIS — M1711 Unilateral primary osteoarthritis, right knee: Secondary | ICD-10-CM

## 2012-04-05 DIAGNOSIS — M1710 Unilateral primary osteoarthritis, unspecified knee: Secondary | ICD-10-CM

## 2012-04-05 DIAGNOSIS — I82890 Acute embolism and thrombosis of other specified veins: Secondary | ICD-10-CM

## 2012-04-05 LAB — POCT INR: INR: 2.1

## 2012-04-05 NOTE — Assessment & Plan Note (Addendum)
Symptoms improved with reaction knee brace, the patient does not believe Katrina Fernandez is helping as much. Supartz #5 of 5 as above. Return in one month to reassess and determine if surgical intervention is warranted. I suspect that she will not be sufficiently better, and would likely benefit from referral to [facility], would prefer Dr. Jodi Geralds.

## 2012-04-05 NOTE — Telephone Encounter (Signed)
Misty,  Will you please let Mrs. Line know that with her INR of 2.1 today I'd like her to keep her current regimen of Tuesday, Thursday, Saturday, Sunday: 7mg   Monday, Wednesday, Friday: 6mg    I'd like her to have it rechecked around Tuesday morning next week, it can be a nurse visit or an office visit, it's whatever is most convenient for her.   Thank you       Diagnosis: Enlarging superficial thrombosis  Goal INR range: 2.0-3.0  Current INR: 2.1 Current Coumadin Dose:See Above PLAN:  and recheck INR in 1 week.  Recent diet or medication changes: none

## 2012-04-05 NOTE — Progress Notes (Signed)
Procedure: Real-time Ultrasound Guided Injection of right knee Device: GE Logiq E  Ultrasound guided injection is preferred based studies that show increased duration, increased effect, greater accuracy, decreased procedural pain, increased response rate, and decreased cost with ultrasound guided versus blind injection.  Verbal informed consent obtained.  Time-out conducted.  Noted no overlying erythema, induration, or other signs of local infection.  Skin prepped in a sterile fashion.  Local anesthesia: Topical Ethyl chloride.  With sterile technique and under real time ultrasound guidance: Needle directed and as her patellar recess, 25 mg/2.5 mL of Supartz (sodium hyaluronate) in a prefilled syringe was injected easily into the knee through a 22-gauge needle. Completed without difficulty  Pain immediately resolved suggesting accurate placement of the medication.  Advised to call if fevers/chills, erythema, induration, drainage, or persistent bleeding.  Image was not captured for this procedure. Impression: Technically successful ultrasound guided injection.  Reaction knee brace strapped back on by me.

## 2012-04-05 NOTE — Telephone Encounter (Signed)
Pt notified of instructions. KG LPN

## 2012-04-18 ENCOUNTER — Ambulatory Visit (INDEPENDENT_AMBULATORY_CARE_PROVIDER_SITE_OTHER): Payer: Medicare Other | Admitting: Family Medicine

## 2012-04-18 DIAGNOSIS — I82409 Acute embolism and thrombosis of unspecified deep veins of unspecified lower extremity: Secondary | ICD-10-CM

## 2012-04-18 LAB — POCT INR: INR: 2.7

## 2012-04-18 NOTE — Progress Notes (Signed)
Patient denies Missed Doses, Extra Doses, Medication Changes, Antibiotic Use, Diet Changes, Dental/Other Procedures, Hospitalization, Bleeding Gums, Nose Bleeds, Blood in Urine, Blood in Stool, Any Bruising, Other Complaints  Return in 3-4 weeks.  Discussed with patient face to face.

## 2012-05-05 ENCOUNTER — Encounter: Payer: Self-pay | Admitting: Sports Medicine

## 2012-05-05 ENCOUNTER — Ambulatory Visit (INDEPENDENT_AMBULATORY_CARE_PROVIDER_SITE_OTHER): Payer: Medicare Other | Admitting: Sports Medicine

## 2012-05-05 VITALS — BP 113/79 | HR 73 | Wt 202.0 lb

## 2012-05-05 DIAGNOSIS — M1711 Unilateral primary osteoarthritis, right knee: Secondary | ICD-10-CM

## 2012-05-05 DIAGNOSIS — M1710 Unilateral primary osteoarthritis, unspecified knee: Secondary | ICD-10-CM

## 2012-05-05 DIAGNOSIS — IMO0002 Reserved for concepts with insufficient information to code with codable children: Secondary | ICD-10-CM

## 2012-05-05 NOTE — Assessment & Plan Note (Addendum)
At this point we have finished intra-articular steroids, as well as Visco supplementation without sufficient response. 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 I think that surgical intervention is now warranted, likely arthroscopy with chondroplasty and possible meniscectomy. I will place the referral. She requests orthopedic specialists of the [facility] in [place]. She can come back to see me on an as-needed basis.

## 2012-05-05 NOTE — Progress Notes (Signed)
SPORTS MEDICINE CONSULTATION REPORT  Subjective:    CC: Followup  HPI: Katrina Fernandez is a very pleasant 46 year old female who comes in for followup of pain in her right knee. She has MRI confirmed tricompartmental arthritis with intrasubstance medial meniscal tear. She has already had an intra-articular steroid injection, and has finished Supartz series. Unfortunately her pain has not improved. She still localizes the pain deep within the knee joint, moderate, not associated with mechanical symptoms.  Past medical history, Surgical history, Family history, Social history, Allergies, and medications have been entered into the medical record, reviewed, and no changes needed.   Review of Systems: No headache, visual changes, nausea, vomiting, diarrhea, constipation, dizziness, abdominal pain, skin rash, fevers, chills, night sweats, weight loss, swollen lymph nodes, body aches, joint swelling, muscle aches, chest pain, shortness of breath, mood changes, visual or auditory hallucinations.   Objective:   Vitals:  Afebrile, vital signs stable. General: Well Developed, well nourished, and in no acute distress.  Neuro/Psych: Alert and oriented x3, extra-ocular muscles intact, able to move all 4 extremities.  Skin: Warm and dry, no rashes noted.  Respiratory: Not using accessory muscles, speaking in full sentences, trachea midline.  Cardiovascular: Pulses palpable, no extremity edema. Abdomen: Does not appear distended. Right Knee: Normal to inspection with no erythema or effusion or obvious bony abnormalities. Palpation normal with no warmth, joint line tenderness, patellar tenderness, or condyle tenderness. ROM full in flexion and extension and lower leg rotation. Ligaments with solid consistent endpoints including ACL, PCL, LCL, MCL. Negative Mcmurray's, Apley's, and Thessalonian tests. Non painful patellar compression. Patellar glide without crepitus. Patellar and quadriceps tendons  unremarkable. Hamstring and quadriceps strength is normal.    Impression and Recommendations:   This case required medical decision making of moderate complexity.

## 2012-05-13 ENCOUNTER — Telehealth: Payer: Self-pay | Admitting: *Deleted

## 2012-05-13 ENCOUNTER — Ambulatory Visit (INDEPENDENT_AMBULATORY_CARE_PROVIDER_SITE_OTHER): Payer: Medicare Other | Admitting: Family Medicine

## 2012-05-13 VITALS — BP 115/76 | HR 70

## 2012-05-13 DIAGNOSIS — I82409 Acute embolism and thrombosis of unspecified deep veins of unspecified lower extremity: Secondary | ICD-10-CM

## 2012-05-13 LAB — POCT INR: INR: 2.4

## 2012-05-13 NOTE — Telephone Encounter (Signed)
Left detailed message on pt's vm with INR instructions

## 2012-05-13 NOTE — Progress Notes (Signed)
Sue Lush, Will you please let Katrina Fernandez know that her INR was 2.4, right within our goal.  No change to her coumadin reigmen needed, please ask her if she needs refills.  She does not need her INR checked for another 4 weeks.

## 2012-05-13 NOTE — Progress Notes (Signed)
Patient ID: Katrina Fernandez, female   DOB: Dec 24, 1965, 46 y.o.   MRN: [id number] INR for coumadin therapy

## 2012-05-13 NOTE — Telephone Encounter (Signed)
Left detailed message on pt's vm

## 2012-05-18 ENCOUNTER — Other Ambulatory Visit: Payer: Self-pay | Admitting: Family Medicine

## 2012-05-24 ENCOUNTER — Other Ambulatory Visit: Payer: Self-pay | Admitting: Family Medicine

## 2012-06-01 ENCOUNTER — Encounter: Payer: Self-pay | Admitting: Sports Medicine

## 2012-06-16 ENCOUNTER — Telehealth: Payer: Self-pay | Admitting: *Deleted

## 2012-06-16 ENCOUNTER — Ambulatory Visit (INDEPENDENT_AMBULATORY_CARE_PROVIDER_SITE_OTHER): Payer: Medicare Other | Admitting: Family Medicine

## 2012-06-16 ENCOUNTER — Ambulatory Visit: Payer: Medicare Other

## 2012-06-16 DIAGNOSIS — Z7901 Long term (current) use of anticoagulants: Secondary | ICD-10-CM

## 2012-06-16 LAB — POCT INR: INR: 2

## 2012-06-16 NOTE — Telephone Encounter (Signed)
Pt notified of coumadin instructions.

## 2012-06-16 NOTE — Progress Notes (Signed)
Katrina Fernandez, Will you please let Katrina Fernandez know that her INR of 2.0 is within our goal of a value between 2.0-3.0. I would encourage her to have this rechecked in 4 weeks, no need to make coumadin adjustments.  As a heads up, she will have completed her 6 months of therapy on May 19. Thank you.  Therapeutic INR since 04/05/12.  I was present for all essential parts of this visit.

## 2012-06-20 ENCOUNTER — Encounter: Payer: Self-pay | Admitting: Family Medicine

## 2012-06-20 DIAGNOSIS — M961 Postlaminectomy syndrome, not elsewhere classified: Secondary | ICD-10-CM | POA: Insufficient documentation

## 2012-06-20 DIAGNOSIS — G894 Chronic pain syndrome: Secondary | ICD-10-CM | POA: Insufficient documentation

## 2012-06-20 DIAGNOSIS — M79609 Pain in unspecified limb: Secondary | ICD-10-CM | POA: Insufficient documentation

## 2012-06-28 ENCOUNTER — Ambulatory Visit (HOSPITAL_BASED_OUTPATIENT_CLINIC_OR_DEPARTMENT_OTHER)
Admission: RE | Admit: 2012-06-28 | Discharge: 2012-06-28 | Disposition: A | Discharge status: Home / Self Care | Payer: Medicare Other | Source: Ambulatory Visit | Attending: Family Medicine | Admitting: Family Medicine

## 2012-06-28 ENCOUNTER — Encounter (HOSPITAL_BASED_OUTPATIENT_CLINIC_OR_DEPARTMENT_OTHER): Payer: Self-pay

## 2012-06-28 ENCOUNTER — Ambulatory Visit (INDEPENDENT_AMBULATORY_CARE_PROVIDER_SITE_OTHER): Payer: Medicare Other | Admitting: Family Medicine

## 2012-06-28 ENCOUNTER — Telehealth: Payer: Self-pay | Admitting: Family Medicine

## 2012-06-28 ENCOUNTER — Encounter: Payer: Self-pay | Admitting: Family Medicine

## 2012-06-28 DIAGNOSIS — R1013 Epigastric pain: Secondary | ICD-10-CM

## 2012-06-28 DIAGNOSIS — R109 Unspecified abdominal pain: Secondary | ICD-10-CM | POA: Insufficient documentation

## 2012-06-28 LAB — BASIC METABOLIC PANEL WITH GFR
BUN: 16 mg/dL (ref 6–23)
CO2: 29 mEq/L (ref 19–32)
Calcium: 9.5 mg/dL (ref 8.4–10.5)
Chloride: 102 mEq/L (ref 96–112)
Creat: 0.94 mg/dL (ref 0.50–1.10)
GFR, Est African American: 85 mL/min
GFR, Est Non African American: 74 mL/min
Glucose, Bld: 90 mg/dL (ref 70–99)
Potassium: 3.3 mEq/L — ABNORMAL LOW (ref 3.5–5.3)
Sodium: 141 mEq/L (ref 135–145)

## 2012-06-28 LAB — CBC
HCT: 43.8 % (ref 36.0–46.0)
Hemoglobin: 14.9 g/dL (ref 12.0–15.0)
MCH: 29.3 pg (ref 26.0–34.0)
MCHC: 34 g/dL (ref 30.0–36.0)
MCV: 86.2 fL (ref 78.0–100.0)
Platelets: 251 10*3/uL (ref 150–400)
RBC: 5.08 MIL/uL (ref 3.87–5.11)
RDW: 13.3 % (ref 11.5–15.5)
WBC: 7.3 10*3/uL (ref 4.0–10.5)

## 2012-06-28 LAB — LIPASE: Lipase: 26 U/L (ref 0–75)

## 2012-06-28 MED ORDER — IOHEXOL 300 MG/ML  SOLN
100.0000 mL | Freq: Once | INTRAMUSCULAR | Status: AC | PRN
  Administered 2012-06-28: 100 mL via INTRAVENOUS

## 2012-06-28 NOTE — Telephone Encounter (Signed)
Katrina Fernandez, Will you please let Katrina Fernandez know that her CT scan was very reassuring.  Not only did it rule out the bleeding I was looking for but it also did not show any abnormalities with her shunt nor misplacement of her gastric banding.  It does show that she's got quite a bit of stool in her large colon which might not be a surprise.  I can confidently say that her pain is from an abdominal wall strain and that minimizing pressure on the abdomen is the best course of action right now.  This would include suppositories or enemas for the stool and muscle relaxers and high dose NSAIDs.  I see she's already on robaxin which is a muscle relaxer, I can call in a Rx NSAID if she'd like.

## 2012-06-28 NOTE — Telephone Encounter (Signed)
Pt notified of results and instructions and she states she doesn't need NSAIDS she will just use the robaxin as needed.KG LPN
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
## 2012-07-02 ENCOUNTER — Other Ambulatory Visit: Payer: Self-pay

## 2012-07-07 ENCOUNTER — Encounter: Payer: Self-pay | Admitting: Family Medicine

## 2012-07-08 ENCOUNTER — Encounter: Payer: Self-pay | Admitting: Family Medicine

## 2012-07-11 ENCOUNTER — Telehealth: Payer: Self-pay | Admitting: *Deleted

## 2012-07-11 ENCOUNTER — Telehealth: Payer: Self-pay | Admitting: Family Medicine

## 2012-07-11 DIAGNOSIS — I8390 Asymptomatic varicose veins of unspecified lower extremity: Secondary | ICD-10-CM

## 2012-07-11 DIAGNOSIS — I82890 Acute embolism and thrombosis of other specified veins: Secondary | ICD-10-CM

## 2012-07-11 NOTE — Telephone Encounter (Signed)
Katrina Fernandez, Will you please let Katrina Fernandez know that on May 19th she will have completed 6 months of coumadin therapy and can stop at that point and will not need a taper.  We typically do not get repeat ultrasounds after the 6 months but since she will probably have met with a vascular specialist by that point the specialist can help her determine if she should or should not get an ultrasound.  A referral has been placed.

## 2012-07-11 NOTE — Telephone Encounter (Signed)
Pt.notified

## 2012-07-11 NOTE — Telephone Encounter (Signed)
Pt notified about vascular suregon.

## 2012-07-15 ENCOUNTER — Other Ambulatory Visit: Payer: Self-pay | Admitting: *Deleted

## 2012-07-15 DIAGNOSIS — I83893 Varicose veins of bilateral lower extremities with other complications: Secondary | ICD-10-CM

## 2012-07-16 DIAGNOSIS — Z86718 Personal history of other venous thrombosis and embolism: Secondary | ICD-10-CM

## 2012-07-16 HISTORY — DX: Personal history of other venous thrombosis and embolism: Z86.718

## 2012-07-18 ENCOUNTER — Ambulatory Visit: Payer: Medicare Other

## 2012-07-24 ENCOUNTER — Other Ambulatory Visit: Payer: Self-pay | Admitting: Family Medicine

## 2012-07-24 DIAGNOSIS — F32A Depression, unspecified: Secondary | ICD-10-CM

## 2012-07-24 DIAGNOSIS — F329 Major depressive disorder, single episode, unspecified: Secondary | ICD-10-CM

## 2012-07-28 ENCOUNTER — Telehealth: Payer: Self-pay | Admitting: Family Medicine

## 2012-07-28 MED ORDER — LORATADINE 10 MG PO TABS: 10.0000 mg | ORAL_TABLET | Freq: Every day | ORAL | Status: DC

## 2012-07-28 MED ORDER — SIMVASTATIN 20 MG PO TABS: 20.0000 mg | ORAL_TABLET | Freq: Every evening | ORAL | Status: DC

## 2012-07-28 NOTE — Telephone Encounter (Signed)
Per request

## 2012-07-28 NOTE — Addendum Note (Signed)
Addended by: Laren Boom on: 07/28/2012 01:31 PM   Modules accepted: Orders

## 2012-07-29 ENCOUNTER — Encounter: Payer: Self-pay | Admitting: Vascular Surgery

## 2012-08-01 ENCOUNTER — Encounter: Payer: Self-pay | Admitting: Vascular Surgery

## 2012-08-01 ENCOUNTER — Encounter (INDEPENDENT_AMBULATORY_CARE_PROVIDER_SITE_OTHER): Payer: Medicare Other | Admitting: *Deleted

## 2012-08-01 ENCOUNTER — Encounter: Payer: Self-pay | Admitting: Family Medicine

## 2012-08-01 ENCOUNTER — Ambulatory Visit (INDEPENDENT_AMBULATORY_CARE_PROVIDER_SITE_OTHER): Payer: Medicare Other | Admitting: Vascular Surgery

## 2012-08-01 VITALS — BP 112/81 | HR 79 | Resp 18 | Ht 63.0 in | Wt 202.0 lb

## 2012-08-01 DIAGNOSIS — M7989 Other specified soft tissue disorders: Secondary | ICD-10-CM

## 2012-08-01 DIAGNOSIS — I80299 Phlebitis and thrombophlebitis of other deep vessels of unspecified lower extremity: Secondary | ICD-10-CM

## 2012-08-01 DIAGNOSIS — I83893 Varicose veins of bilateral lower extremities with other complications: Secondary | ICD-10-CM

## 2012-08-01 DIAGNOSIS — I781 Nevus, non-neoplastic: Secondary | ICD-10-CM

## 2012-08-01 NOTE — Progress Notes (Signed)
Subjective:     Patient ID: Katrina Fernandez, female   DOB: 14-Oct-1965, 47 y.o.   MRN: [id number]  HPI this 47 year old female is evaluated for possible venous insufficiency of the left leg. She has a history of DVT in the left calf veins in September 2013. This was treated with anticoagulation and she remains on Coumadin. She has noticed some small spider veins in the left calf and these cause aching and stinging discomfort as the day progresses. She also awakens with edema in the left ankle and foot and calf which persists during the day. She has no symptoms in the contralateral right leg. She has no history of stasis ulcers, bleeding, or bulging varicosities. She does not elevate her legs or regular basis nor wear elastic compression stockings.  Past Medical History  Diagnosis Date  . Hyperlipidemia   . Spinal stenosis   . GERD (gastroesophageal reflux disease) 01/28/2012  . Right ankle pain 01/28/2012  . Depression 01/28/2012  . DVT (deep venous thrombosis)     History  Substance Use Topics  . Smoking status: Never Smoker   . Smokeless tobacco: Not on file  . Alcohol Use: No    Family History  Problem Relation Age of Onset  . Depression    . Colon cancer Father   . Ovarian cancer Maternal Grandmother   . Pancreatic cancer Maternal Grandfather     Allergies  Allergen Reactions  . Penicillins Anaphylaxis  . Tape Rash    Plastic tape    Current outpatient prescriptions:citalopram (CELEXA) 40 MG tablet, Take 1 tablet (40 mg total) by mouth daily., Disp: 90 tablet, Rfl: 3;  clonazePAM (KLONOPIN) 0.5 MG tablet, Take 1 tablet (0.5 mg total) by mouth daily as needed., Disp: 30 tablet, Rfl: 1;  dicyclomine (BENTYL) 20 MG tablet, Take 20 mg by mouth every 6 (six) hours as needed., Disp: , Rfl:  loratadine (CLARITIN) 10 MG tablet, Take 1 tablet (10 mg total) by mouth daily., Disp: 90 tablet, Rfl: 3;  lubiprostone (AMITIZA) 24 MCG capsule, Take 24 mcg by mouth 2 (two) times daily with a  meal., Disp: , Rfl: ;  methocarbamol (ROBAXIN) 500 MG tablet, Take 500 mg by mouth 2 (two) times daily., Disp: , Rfl: ;  morphine (AVINZA) 60 MG 24 hr capsule, Take 60 mg by mouth daily., Disp: , Rfl:  oxycodone (OXY-IR) 5 MG capsule, Take 5 mg by mouth 2 (two) times daily as needed., Disp: , Rfl: ;  pantoprazole (PROTONIX) 40 MG tablet, Take 40 mg by mouth daily., Disp: , Rfl: ;  Polyethylene Glycol 3350 (MIRALAX PO), Take by mouth., Disp: , Rfl: ;  ranitidine (ZANTAC) 150 MG tablet, Take 150 mg by mouth 2 (two) times daily., Disp: , Rfl:  simvastatin (ZOCOR) 20 MG tablet, Take 1 tablet (20 mg total) by mouth every evening., Disp: 90 tablet, Rfl: 3;  tiZANidine (ZANAFLEX) 4 MG capsule, Take 4 mg by mouth. 2-3 times daily, Disp: , Rfl: ;  topiramate (TOPAMAX) 100 MG tablet, Take 100 mg by mouth 2 (two) times daily., Disp: , Rfl: ;  triamterene-hydrochlorothiazide (DYAZIDE) 37.5-25 MG per capsule, Take 1 capsule by mouth every morning., Disp: , Rfl:  warfarin (COUMADIN) 1 MG tablet, TAKE ONE TABLET BY MOUTH ONE TIME DAILY, Disp: 30 tablet, Rfl: 2;  warfarin (COUMADIN) 5 MG tablet, Take 1 tablet (5 mg total) by mouth daily., Disp: 30 tablet, Rfl: 11;  zolpidem (AMBIEN) 10 MG tablet, Take 10 mg by mouth at bedtime as needed., Disp: ,  Rfl:   BP 112/81  Pulse 79  Resp 18  Ht 5\' 3"  (1.6 m)  Wt 202 lb (91.627 kg)  BMI 35.79 kg/m2  Body mass index is 35.79 kg/(m^2).           Review of SystemsDenies chest pain, dyspnea on exertion, PND, orthopnea,, this is. Does have history of 8 back operations in his cervical spine procedure as well and has total disability for these problems. Complains of weakness in her legs.     Objective:   Physical Examblood pressure 112/81 heart rate 79 respirations 18 Gen.-alert and oriented x3 in no apparent distress HEENT normal for age Lungs no rhonchi or wheezing Cardiovascular regular rhythm no murmurs carotid pulses 3+ palpable no bruits audible Abdomen soft  nontender no palpable masses Musculoskeletal free of  major deformities Skin clear -no rashes Neurologic normal Lower extremities 3+ femoral and dorsalis pedis pulses palpable bilaterally with no edemaon the right 1+ edema left calf and ankle Left calf with a few small spider veins just distal to the popliteal fossa. No bulging varicosities noted. No hyperpigmentation or ulceration noted.  Today I ordered a venous duplex exam of the left leg which are reviewed and interpreted. There is no DVT. There is some reflux in the deep calf veins-peroneal where the DVT was located previously. She does have some gross reflux in the left great saphenous vein in the upper half of the left thigh.       Assessment:     Mild chronic venous insufficiency secondary to previous DVT left peroneal vein with deep venous reflux     Plan:     #1 a well fitting short leg elastic compression stockings #2 elevate foot of bed 2-3 inches in height and upright stockings first thing in the a.m. #3 no indication for laser ablation treatment

## 2012-08-02 ENCOUNTER — Encounter: Payer: Self-pay | Admitting: Family Medicine

## 2012-10-21 ENCOUNTER — Telehealth: Payer: Self-pay | Admitting: Family Medicine

## 2012-10-21 DIAGNOSIS — F419 Anxiety disorder, unspecified: Secondary | ICD-10-CM

## 2012-10-21 MED ORDER — CLONAZEPAM 0.5 MG PO TABS: 0.5000 mg | ORAL_TABLET | Freq: Every day | ORAL | Status: DC | PRN

## 2012-10-21 NOTE — Telephone Encounter (Signed)
pe patient request

## 2012-11-08 ENCOUNTER — Ambulatory Visit (INDEPENDENT_AMBULATORY_CARE_PROVIDER_SITE_OTHER): Payer: Medicare Other | Admitting: Family Medicine

## 2012-11-08 ENCOUNTER — Encounter: Payer: Self-pay | Admitting: Family Medicine

## 2012-11-08 VITALS — BP 108/75 | HR 96 | Temp 98.6°F | Wt 214.0 lb

## 2012-11-08 DIAGNOSIS — R5383 Other fatigue: Secondary | ICD-10-CM

## 2012-11-08 DIAGNOSIS — M79662 Pain in left lower leg: Secondary | ICD-10-CM

## 2012-11-08 DIAGNOSIS — R5381 Other malaise: Secondary | ICD-10-CM

## 2012-11-08 DIAGNOSIS — M79609 Pain in unspecified limb: Secondary | ICD-10-CM

## 2012-11-08 DIAGNOSIS — M79672 Pain in left foot: Secondary | ICD-10-CM

## 2012-11-08 NOTE — Progress Notes (Signed)
CC: Katrina Fernandez is a 47 y.o. female is here for Foot Pain, Fatigue, Leg Pain and request referal for breast reduction   Subjective: HPI:  Patient complains of left calf pain that has been present for one to 2 weeks. It is described as a soreness mild severity. Nonradiating. Worse with foot plantar flexion, worse with palpation of calf, improves with rest. Denies recent injury. She occasionally has left ankle swelling that is worse at the end of the day if on her feet absent after sleeping for hours of elevation of the foot. She denies overlying skin changes in the appendage denies weakness or sensory disturbances  Patient complains of left foot pain localized in the lateral surface described only as pain moderate in severity no radiation. Worse with bearing weight or walking. Pain absent when cycling or nonweightbearing. Pain is been present most days of the week for the last month. No interventions as of yet. She denies skin changes, motor or sensory disturbances in the foot nor recent injury  Patient complains of a fatigue that has been present for the last 2-4 weeks mild to moderate severity has only been present after cycling, noticed if cycling more than 45 minutes soon after she gets home she has to take a 1-3 hour nap. She denies worsening shortness of breath, worsening endurance, chest pain, irregular heartbeat, depression, nonrestorative sleep. Denies unintentional weight gain or loss, heat or cold intolerance, tremor, hair or skin changes.  She is requesting a referral for breast reduction surgery due to concerns is contributed to back pain     Review Of Systems Outlined In HPI  Past Medical History  Diagnosis Date  . Hyperlipidemia   . Spinal stenosis   . GERD (gastroesophageal reflux disease) 01/28/2012  . Right ankle pain 01/28/2012  . Depression 01/28/2012  . DVT (deep venous thrombosis)      Family History  Problem Relation Age of Onset  . Depression    . Colon  cancer Father   . Ovarian cancer Maternal Grandmother   . Pancreatic cancer Maternal Grandfather      History  Substance Use Topics  . Smoking status: Never Smoker   . Smokeless tobacco: Not on file  . Alcohol Use: No     Objective: Filed Vitals:   11/08/12 0846  BP: 108/75  Pulse: 96  Temp: 98.6 F (37 C)    General: Alert and Oriented, No Acute Distress HEENT: Pupils equal, round, reactive to light. Conjunctivae clear.   moist mucous membranes pharynx unremarkable  Lungs: Clear to auscultation bilaterally, no wheezing/ronchi/rales.  Comfortable work of breathing. Good air movement. Cardiac: Regular rate and rhythm. Normal S1/S2.  No murmurs, rubs, nor gallops.   Abdomen:  obese soft nontender  Extremities: No peripheral edema.  Strong peripheral pulses.  the left calf has full range of motion and strength there are no palpable cords Hoffman's is negative, there is some mild varicose veins but otherwise no skin abnormalities. Circumference of the left calf is 43 cm identical to that of the right no swelling the left lower extremity.  She has pain at the base of the fifth metatarsal with palpation on the left foot no pain in the toe box over the malleoli or elsewhere in the foot  Mental Status: No depression, anxiety, nor agitation. Skin: Warm and dry.  Assessment & Plan: [name] was seen today for foot pain, fatigue, leg pain and request referal for breast reduction.  Diagnoses and associated orders for this visit:  Left foot pain - DG Foot Complete Left; Future  Fatigue - B12 - Vitamin D 25 hydroxy - CBC with Differential - TSH  Calf pain, left    Fatigue: Discussed with patient he would be expected that she would be tired after her new passion with cycling however will rule out abnormalities with blood work above Left calf pain: Discussed with patient low suspicion of recurrence of DVT, I've encouraged her to focus on stretching exercises she may continue  cycling Left foot pain: Likely tendinitis however unable to exclude fracture at the base of the fifth metatarsal until x-rays obtained, she plans to have this and blood work done on Thursday declines obtaining them today  Referral will be placed to breast surgeon she prefers here on [facility] in [place]? On [address] [place]  Return if symptoms worsen or fail to improve.

## 2012-11-10 ENCOUNTER — Ambulatory Visit: Payer: Medicare Other

## 2012-11-10 ENCOUNTER — Ambulatory Visit (HOSPITAL_BASED_OUTPATIENT_CLINIC_OR_DEPARTMENT_OTHER)
Admission: RE | Admit: 2012-11-10 | Discharge: 2012-11-10 | Disposition: A | Discharge status: Home / Self Care | Payer: Medicare Other | Source: Ambulatory Visit | Attending: Family Medicine | Admitting: Family Medicine

## 2012-11-10 ENCOUNTER — Telehealth: Payer: Self-pay | Admitting: Family Medicine

## 2012-11-10 DIAGNOSIS — M79609 Pain in unspecified limb: Secondary | ICD-10-CM | POA: Insufficient documentation

## 2012-11-10 DIAGNOSIS — M549 Dorsalgia, unspecified: Secondary | ICD-10-CM

## 2012-11-10 DIAGNOSIS — M79672 Pain in left foot: Secondary | ICD-10-CM

## 2012-11-10 DIAGNOSIS — M7730 Calcaneal spur, unspecified foot: Secondary | ICD-10-CM | POA: Insufficient documentation

## 2012-11-10 LAB — VITAMIN B12: Vitamin B-12: 419 pg/mL (ref 211–911)

## 2012-11-10 LAB — CBC WITH DIFFERENTIAL/PLATELET
Basophils Absolute: 0.1 10*3/uL (ref 0.0–0.1)
Basophils Relative: 1 % (ref 0–1)
Eosinophils Absolute: 0.1 10*3/uL (ref 0.0–0.7)
Eosinophils Relative: 1 % (ref 0–5)
HCT: 40.3 % (ref 36.0–46.0)
Hemoglobin: 13.5 g/dL (ref 12.0–15.0)
Lymphocytes Relative: 23 % (ref 12–46)
Lymphs Abs: 2.1 10*3/uL (ref 0.7–4.0)
MCH: 28.8 pg (ref 26.0–34.0)
MCHC: 33.5 g/dL (ref 30.0–36.0)
MCV: 85.9 fL (ref 78.0–100.0)
Monocytes Absolute: 0.5 10*3/uL (ref 0.1–1.0)
Monocytes Relative: 6 % (ref 3–12)
Neutro Abs: 6.5 10*3/uL (ref 1.7–7.7)
Neutrophils Relative %: 69 % (ref 43–77)
Platelets: 299 10*3/uL (ref 150–400)
RBC: 4.69 MIL/uL (ref 3.87–5.11)
RDW: 14.2 % (ref 11.5–15.5)
WBC: 9.3 10*3/uL (ref 4.0–10.5)

## 2012-11-10 LAB — TSH: TSH: 0.871 u[IU]/mL (ref 0.350–4.500)

## 2012-11-10 NOTE — Telephone Encounter (Signed)
Referral placed.

## 2012-11-10 NOTE — Telephone Encounter (Signed)
Andrea/Coverage, Can you please ask Hong if she came across the name of the breast reduction surgeon that her family member had used? There's a Biomedical engineer through [facility] by the name of Levada Schilling, we could use her group or the group that [facility] heard positive feedback from.

## 2012-11-10 NOTE — Telephone Encounter (Signed)
Called pt and she states she is ok with seeing Burna Sis as that name sounds familiar and that may be the surgeon she was talking about. Please do referral. Also she wanted me to let you know that she is going today to get her bloodwork and xray done. Barry Dienes, LPN

## 2012-11-11 ENCOUNTER — Telehealth: Payer: Self-pay | Admitting: Family Medicine

## 2012-11-11 DIAGNOSIS — M79672 Pain in left foot: Secondary | ICD-10-CM

## 2012-11-11 LAB — VITAMIN D 25 HYDROXY (VIT D DEFICIENCY, FRACTURES): Vit D, 25-Hydroxy: 32 ng/mL (ref 30–89)

## 2012-11-11 MED ORDER — MELOXICAM 15 MG PO TABS: 15.0000 mg | ORAL_TABLET | Freq: Every day | ORAL | Status: DC

## 2012-11-11 NOTE — Telephone Encounter (Signed)
See lab result note 6/26

## 2012-11-22 ENCOUNTER — Telehealth: Payer: Self-pay | Admitting: Family Medicine

## 2012-11-22 DIAGNOSIS — I10 Essential (primary) hypertension: Secondary | ICD-10-CM

## 2012-11-22 MED ORDER — TRIAMTERENE-HCTZ 37.5-25 MG PO CAPS: 1.0000 | ORAL_CAPSULE | ORAL | Status: DC

## 2012-11-22 NOTE — Telephone Encounter (Signed)
Per patient request

## 2013-01-10 ENCOUNTER — Ambulatory Visit (INDEPENDENT_AMBULATORY_CARE_PROVIDER_SITE_OTHER): Payer: Medicare Other | Admitting: Family Medicine

## 2013-01-10 ENCOUNTER — Encounter: Payer: Self-pay | Admitting: Family Medicine

## 2013-01-10 VITALS — BP 114/79 | HR 75 | Wt 212.0 lb

## 2013-01-10 DIAGNOSIS — R109 Unspecified abdominal pain: Secondary | ICD-10-CM

## 2013-01-10 DIAGNOSIS — M549 Dorsalgia, unspecified: Secondary | ICD-10-CM

## 2013-01-10 LAB — COMPLETE METABOLIC PANEL WITH GFR
ALT: 14 U/L (ref 0–35)
AST: 17 U/L (ref 0–37)
Albumin: 4.6 g/dL (ref 3.5–5.2)
Alkaline Phosphatase: 118 U/L — ABNORMAL HIGH (ref 39–117)
BUN: 20 mg/dL (ref 6–23)
CO2: 28 mEq/L (ref 19–32)
Calcium: 9.5 mg/dL (ref 8.4–10.5)
Chloride: 105 mEq/L (ref 96–112)
Creat: 1 mg/dL (ref 0.50–1.10)
GFR, Est African American: 79 mL/min
GFR, Est Non African American: 68 mL/min
Glucose, Bld: 83 mg/dL (ref 70–99)
Potassium: 4 mEq/L (ref 3.5–5.3)
Sodium: 139 mEq/L (ref 135–145)
Total Bilirubin: 0.5 mg/dL (ref 0.3–1.2)
Total Protein: 7.4 g/dL (ref 6.0–8.3)

## 2013-01-10 LAB — POCT URINALYSIS DIPSTICK
Bilirubin, UA: NEGATIVE
Blood, UA: NEGATIVE
Glucose, UA: NEGATIVE
Ketones, UA: NEGATIVE
Leukocytes, UA: NEGATIVE
Nitrite, UA: NEGATIVE
Protein, UA: NEGATIVE
Spec Grav, UA: 1.015
Urobilinogen, UA: 0.2
pH, UA: 7.5

## 2013-01-10 NOTE — Progress Notes (Signed)
CC: Katrina Fernandez is a 47 y.o. female is here for rt side back pain   Subjective: HPI: Patient presents with complaints of right flank pain that has been present on a daily basis since late last week. Symptoms coincided with changing from oxycodone to hydrocodone-acetaminophen. Interestingly symptoms are predictable and reproducible anytime she takes hydrocodone-acetaminophen. Nothing else seems to make the symptoms worse she cannot reproduce symptoms with positional maneuvers. Symptoms gradually subside hours after taking the above. 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 She denies dietary habit influences on her pain. It is described as a deep burning that is nonradiating underneath her ribs on the right flank. 6 months ago she had a CT scan of the abdomen and pelvis which revealed no abnormalities in the urinary system however she did have a nonobstructing kidney stone in the left kidney. She denies polyuria, dysuria urinary frequency nor urgency. Denies Change in color or odor or consistency of her urine. Denies diarrhea, constipation nor any other genitourinary complaints. Denies nausea vomiting or decreased appetite. No pulmonary complaints  Review Of Systems Outlined In HPI  Past Medical History  Diagnosis Date  . Hyperlipidemia   . Spinal stenosis   . GERD (gastroesophageal reflux disease) 01/28/2012  . Right ankle pain 01/28/2012  . Depression 01/28/2012  . DVT (deep venous thrombosis)      Family History  Problem Relation Age of Onset  . Depression    . Colon cancer Father   . Ovarian cancer Maternal Grandmother   . Pancreatic cancer Maternal Grandfather      History  Substance Use Topics  . Smoking status: Never Smoker   . Smokeless tobacco: Not on file  . Alcohol Use: No     Objective: Filed Vitals:   01/10/13 0908  BP: 114/79  Pulse: 75    General: Alert and  Oriented, No Acute Distress HEENT: Pupils equal, round, reactive to light. Conjunctivae clear.  Moist mucous membranes pharynx unremarkable Lungs: Clear to auscultation bilaterally, no wheezing/ronchi/rales.  Comfortable work of breathing. Good air movement. Cardiac: Regular rate and rhythm. Normal S1/S2.  No murmurs, rubs, nor gallops.   Abdomen: Obese soft nontender without rebound no guarding negative Murphy's Back: No CVA tenderness bilaterally Mental Status: No depression, anxiety, nor agitation. Skin: Warm and dry.  Assessment & Plan: Katrina Fernandez was seen today for rt side back pain.  Diagnoses and associated orders for this visit:  Back pain - Urinalysis Dipstick - Urine Culture  Right flank pain - COMPLETE METABOLIC PANEL WITH GFR    Right flank pain: Urinalysis without blood or any other abnormalities, low suspicion for nephrolithiasis at this time, will look into hepatitis due to acetaminophen, if lack of hepatic dysfunction will focus on psoas rehabilitation  Return if symptoms worsen or fail to improve.

## 2013-01-12 LAB — URINE CULTURE
Colony Count: NO GROWTH
Organism ID, Bacteria: NO GROWTH

## 2013-01-31 ENCOUNTER — Encounter: Payer: Self-pay | Admitting: Family Medicine

## 2013-01-31 ENCOUNTER — Ambulatory Visit (INDEPENDENT_AMBULATORY_CARE_PROVIDER_SITE_OTHER): Payer: Medicare Other | Admitting: Family Medicine

## 2013-01-31 VITALS — BP 120/75 | HR 82 | Wt 211.0 lb

## 2013-01-31 DIAGNOSIS — K120 Recurrent oral aphthae: Secondary | ICD-10-CM

## 2013-01-31 MED ORDER — RANITIDINE HCL 150 MG PO TABS: ORAL_TABLET | ORAL | Status: DC

## 2013-01-31 MED ORDER — TRIAMCINOLONE ACETONIDE 0.1 % MT PSTE: PASTE | OROMUCOSAL | Status: DC

## 2013-01-31 MED ORDER — NYSTATIN 100000 UNIT/ML MT SUSP: 500000.0000 [IU] | Freq: Four times a day (QID) | OROMUCOSAL | Status: DC

## 2013-01-31 NOTE — Progress Notes (Signed)
CC: Katrina Fernandez is a 47 y.o. female is here for splotches on tongue   Subjective: HPI:  Patient complains of tender spots on her tongue in her cheeks of the mouth that have been present off and on for the past month. They are worse with hot or spicy foods nothing has made it better, only interventions include saltwater gargles. She's never had this before she denies recent trauma to the tongue or mouth. She's been her regular state of health overall for the past 2 months. She wakes up every morning with a sour taste in the back of her throat her reflux seems to be worsening as well at the past 2 weeks. She denies sore throat, unintentional weight loss, dental pain, fevers, chills, dysphagia, neck pain, rashes fevers or chills  Review Of Systems Outlined In HPI  Past Medical History  Diagnosis Date  . Hyperlipidemia   . Spinal stenosis   . GERD (gastroesophageal reflux disease) 01/28/2012  . Right ankle pain 01/28/2012  . Depression 01/28/2012  . DVT (deep venous thrombosis)      Family History  Problem Relation Age of Onset  . Depression    . Colon cancer Father   . Ovarian cancer Maternal Grandmother   . Pancreatic cancer Maternal Grandfather      History  Substance Use Topics  . Smoking status: Never Smoker   . Smokeless tobacco: Not on file  . Alcohol Use: No     Objective: Filed Vitals:   01/31/13 0854  BP: 120/75  Pulse: 82    General: Alert and Oriented, No Acute Distress HEENT: Pupils equal, round, reactive to light. Conjunctivae clear.  External ears unremarkable, canals clear with intact TMs with appropriate landmarks.  Middle ear appears open without effusion. Pink inferior turbinates.  Moist mucous membranes, pharynx without inflammation nor lesions.  Neck supple without palpable lymphadenopathy nor abnormal masses. Anterior tongue has 3 shallow 1 cm diameter areas of smooth papillae-less regions still the color normal tone. Buccal mucosal in front of left  inferior canine tooth as aphthous ulcer Lungs: Clear comfortable work of breathing  Cardiac: Regular rate and rhythm. Mental Status: No depression, anxiety, nor agitation. Skin: Warm and dry.  Assessment & Plan: Katrina Fernandez was seen today for splotches on tongue.  Diagnoses and associated orders for this visit:  Aphthous ulcer - nystatin (MYCOSTATIN) 100000 UNIT/ML suspension; Take 5 mLs (500,000 Units total) by mouth 4 (four) times daily. - triamcinolone (KENALOG) 0.1 % paste; Place on affected tounge/gums three times a day after meals.  Other Orders - ranitidine (ZANTAC) 150 MG tablet; One by mouth twice a day to prevent gastric reflux symptoms.    Discuss treatment of aphthous ulcers using triamcinolone paste plus or minus nystatin swish and swallow until 3 days after symptoms resolve, she also had ranitidine 150 mg twice a day to her GERD regimen  Return if symptoms worsen or fail to improve.

## 2013-02-08 ENCOUNTER — Telehealth: Payer: Self-pay | Admitting: *Deleted

## 2013-02-08 DIAGNOSIS — K120 Recurrent oral aphthae: Secondary | ICD-10-CM

## 2013-02-08 MED ORDER — NYSTATIN 100000 UNIT/ML MT SUSP: 500000.0000 [IU] | Freq: Four times a day (QID) | OROMUCOSAL | Status: DC

## 2013-02-08 MED ORDER — TRIAMCINOLONE ACETONIDE 0.1 % MT PSTE: PASTE | OROMUCOSAL | Status: DC

## 2013-02-08 NOTE — Telephone Encounter (Signed)
Pt informed.  Misty Ahmad, LPN  

## 2013-02-08 NOTE — Telephone Encounter (Signed)
I just sent about half a liter of an order for nystatin mouth wash that I'd encourage her to use with the kenalog paste since they should be more effective working together.

## 2013-02-08 NOTE — Telephone Encounter (Signed)
Pt states she used the Kenalog paste you gave her but the Nystatin was sent back to you per what the pharmacy told the pt due to [facility] sent was not enough. States now that she has stopped the paste the places have come back on her tongue. She wants to know if she needs to do the paste more and if you want to resend the Nystatin for her to do since she didn't receive it. Please advise.  Meyer Cory, LPN

## 2013-02-28 ENCOUNTER — Encounter: Payer: Self-pay | Admitting: Family Medicine

## 2013-02-28 ENCOUNTER — Telehealth: Payer: Self-pay | Admitting: Family Medicine

## 2013-02-28 DIAGNOSIS — K1370 Unspecified lesions of oral mucosa: Secondary | ICD-10-CM

## 2013-02-28 NOTE — Telephone Encounter (Signed)
ENT request

## 2013-03-07 ENCOUNTER — Other Ambulatory Visit (HOSPITAL_BASED_OUTPATIENT_CLINIC_OR_DEPARTMENT_OTHER): Payer: Self-pay | Admitting: Neurosurgery

## 2013-03-07 DIAGNOSIS — M4712 Other spondylosis with myelopathy, cervical region: Secondary | ICD-10-CM

## 2013-03-07 DIAGNOSIS — M5126 Other intervertebral disc displacement, lumbar region: Secondary | ICD-10-CM

## 2013-03-11 ENCOUNTER — Ambulatory Visit (HOSPITAL_BASED_OUTPATIENT_CLINIC_OR_DEPARTMENT_OTHER)
Admission: RE | Admit: 2013-03-11 | Discharge: 2013-03-11 | Disposition: A | Discharge status: Home / Self Care | Payer: Medicare Other | Source: Ambulatory Visit | Attending: Neurosurgery | Admitting: Neurosurgery

## 2013-03-11 DIAGNOSIS — M4712 Other spondylosis with myelopathy, cervical region: Secondary | ICD-10-CM

## 2013-03-11 DIAGNOSIS — M79609 Pain in unspecified limb: Secondary | ICD-10-CM | POA: Insufficient documentation

## 2013-03-11 DIAGNOSIS — Z981 Arthrodesis status: Secondary | ICD-10-CM | POA: Insufficient documentation

## 2013-03-11 DIAGNOSIS — M48061 Spinal stenosis, lumbar region without neurogenic claudication: Secondary | ICD-10-CM | POA: Insufficient documentation

## 2013-03-11 DIAGNOSIS — M542 Cervicalgia: Secondary | ICD-10-CM | POA: Insufficient documentation

## 2013-03-11 DIAGNOSIS — G935 Compression of brain: Secondary | ICD-10-CM | POA: Insufficient documentation

## 2013-03-11 DIAGNOSIS — M5126 Other intervertebral disc displacement, lumbar region: Secondary | ICD-10-CM

## 2013-03-11 DIAGNOSIS — M549 Dorsalgia, unspecified: Secondary | ICD-10-CM | POA: Insufficient documentation

## 2013-04-28 DIAGNOSIS — M545 Low back pain, unspecified: Secondary | ICD-10-CM | POA: Insufficient documentation

## 2013-04-28 DIAGNOSIS — M542 Cervicalgia: Secondary | ICD-10-CM | POA: Insufficient documentation

## 2013-04-28 DIAGNOSIS — M961 Postlaminectomy syndrome, not elsewhere classified: Secondary | ICD-10-CM | POA: Insufficient documentation

## 2013-05-01 ENCOUNTER — Other Ambulatory Visit: Payer: Self-pay | Admitting: Family Medicine

## 2013-05-04 ENCOUNTER — Other Ambulatory Visit (HOSPITAL_BASED_OUTPATIENT_CLINIC_OR_DEPARTMENT_OTHER): Payer: Self-pay | Admitting: Plastic Surgery

## 2013-05-04 DIAGNOSIS — Z1231 Encounter for screening mammogram for malignant neoplasm of breast: Secondary | ICD-10-CM

## 2013-05-05 ENCOUNTER — Ambulatory Visit (HOSPITAL_BASED_OUTPATIENT_CLINIC_OR_DEPARTMENT_OTHER)
Admission: RE | Admit: 2013-05-05 | Discharge: 2013-05-05 | Disposition: A | Discharge status: Home / Self Care | Payer: Medicare Other | Source: Ambulatory Visit | Attending: Plastic Surgery | Admitting: Plastic Surgery

## 2013-05-05 DIAGNOSIS — Z1231 Encounter for screening mammogram for malignant neoplasm of breast: Secondary | ICD-10-CM

## 2013-05-30 ENCOUNTER — Encounter: Payer: Self-pay | Admitting: Family Medicine

## 2013-05-30 DIAGNOSIS — N62 Hypertrophy of breast: Secondary | ICD-10-CM | POA: Insufficient documentation

## 2013-06-01 ENCOUNTER — Encounter (HOSPITAL_BASED_OUTPATIENT_CLINIC_OR_DEPARTMENT_OTHER): Payer: Self-pay | Admitting: *Deleted

## 2013-06-01 NOTE — Progress Notes (Signed)
To bring all meds and go ahead and pack overnight bag since she lives in [place] for fluid-may need [employer]

## 2013-06-06 ENCOUNTER — Other Ambulatory Visit: Payer: Self-pay | Admitting: Plastic Surgery

## 2013-06-06 DIAGNOSIS — N62 Hypertrophy of breast: Secondary | ICD-10-CM

## 2013-06-06 NOTE — H&P (Signed)
Kaula Ann Feggins  05/30/2013 10:30 AM   Office Visit  MRN:  [id number]  Department: Plastic Surgery  Dept Phone: [phone]  Description: Female DOB: 09/16/1965  Provider: Nakhia Levitan Sanger, DO    Diagnoses    Symptomatic mammary hypertrophy    -  Primary   611.1      Reason for Visit -  Breast Reduction     Vitals - Last Recorded    116/79  92  1.6 m (5' 3")  98.612 kg (217 lb 6.4 oz)  38.52 kg/m2       Subjective:    Patient ID: Katrina Fernandez is a 47 y.o. female.  HPI The patient is a 47 y.o. year old female here for a history and physical for a bilateral breast reduction. She is 5 ft 3 in and weighs 210 lbs and current bra size is 40D. She does not have a family history of breast cancer. Her other medical history is remarkable for chronic back and neck pain, obesity, depression, GERD, ankle pain, baker's cyst of knee, superficial vein thrombosis.   Her surgical history is remarkable for neck surgery, back surgery, appendectomy, hysterectomy, cholecystectomy and lap band x 2.   She has had a previous mammogram.  Her breast exam reveals a normal exam with no palpable masses. On physical exam she has grade 3 ptosis. The rest of the physical exam is remarkable for well healed scars. After discussing the treatment options the patient has elected for breast reduction. The technique to be used for the breast reduction is an inferior pedical technique. The desired size of the breast is in the B-C range.  The following portions of the patient's history were reviewed and updated as appropriate: allergies, current medications, past family history, past medical history, past social history, past surgical history and problem list.  Review of Systems  Constitutional: Negative.   HENT: Negative.   Eyes: Negative.   Respiratory: Negative.   Cardiovascular: Negative.   Gastrointestinal: Negative.   Endocrine: Negative.   Genitourinary: Negative.   Neurological: Negative.   Hematological:  Negative.   Psychiatric/Behavioral: Negative.       Objective:    Physical Exam  Constitutional: She appears well-developed and well-nourished.  HENT:   Head: Normocephalic and atraumatic.  Eyes: Conjunctivae and EOM are normal. Pupils are equal, round, and reactive to light.  Cardiovascular: Normal rate.   Pulmonary/Chest: Effort normal.  Abdominal: Soft.  Musculoskeletal: Normal range of motion.  Neurological: She is alert.  Skin: Skin is warm.  Psychiatric: She has a normal mood and affect. Her behavior is normal. Judgment and thought content normal.      Assessment:   1.  Symptomatic mammary hypertrophy        Plan:     The consent was obtained with risks and complications reviewed which included bleeding, pain, scar, infection and the risk of anesthesia.  The patients questions were answered to the patients expressed satisfaction.  Plan for bilateral breast reduction.  Medications Ordered This Encounter     HYDROcodone-acetaminophen (NORCO) 5-325 mg per tablet 30 Take 1 tablet by mouth every 6 (six) hours as needed for up to 10 days for Pain.    ondansetron (ZOFRAN, AS HYDROCHLORIDE,) 4 MG tablet Take 1 tablet (4 mg total) by mouth every 8 (eight) hours as needed for up to 7 days for Nausea / Vomiting. - Oral    cephalexin (KEFLEX) 500 MG capsule 28  Take 1 capsule (500 mg total) by mouth 4   times daily.    enoxaparin (LOVENOX) 30 mg/0.3 mL Syrg injection  40 units daily for 7 days     Discontinued Medications      morphine (MS CONTIN) 15 MG 12 hr tablet Completed Therapy    morphine (MS CONTIN) 60 MG 12 hr tablet Completed Therapy    clotrimazole (MYCELEX) 10 mg troche Completed Therapy    meloxicam (MOBIC) 15 MG tablet Completed Therapy

## 2013-06-07 ENCOUNTER — Encounter (HOSPITAL_BASED_OUTPATIENT_CLINIC_OR_DEPARTMENT_OTHER)
Admission: RE | Disposition: A | Discharge status: Home / Self Care | Payer: Self-pay | Source: Ambulatory Visit | Attending: Plastic Surgery

## 2013-06-07 ENCOUNTER — Encounter (HOSPITAL_BASED_OUTPATIENT_CLINIC_OR_DEPARTMENT_OTHER): Payer: Self-pay | Admitting: *Deleted

## 2013-06-07 ENCOUNTER — Ambulatory Visit (HOSPITAL_BASED_OUTPATIENT_CLINIC_OR_DEPARTMENT_OTHER): Payer: Medicare Other | Admitting: Anesthesiology

## 2013-06-07 ENCOUNTER — Encounter (HOSPITAL_BASED_OUTPATIENT_CLINIC_OR_DEPARTMENT_OTHER): Payer: Medicare Other | Admitting: Anesthesiology

## 2013-06-07 ENCOUNTER — Ambulatory Visit (HOSPITAL_BASED_OUTPATIENT_CLINIC_OR_DEPARTMENT_OTHER)
Admission: RE | Admit: 2013-06-07 | Discharge: 2013-06-07 | Disposition: A | Discharge status: Home / Self Care | Payer: Medicare Other | Source: Ambulatory Visit | Attending: Plastic Surgery | Admitting: Plastic Surgery

## 2013-06-07 DIAGNOSIS — M7120 Synovial cyst of popliteal space [Baker], unspecified knee: Secondary | ICD-10-CM | POA: Insufficient documentation

## 2013-06-07 DIAGNOSIS — F3289 Other specified depressive episodes: Secondary | ICD-10-CM | POA: Insufficient documentation

## 2013-06-07 DIAGNOSIS — N62 Hypertrophy of breast: Secondary | ICD-10-CM | POA: Insufficient documentation

## 2013-06-07 DIAGNOSIS — F329 Major depressive disorder, single episode, unspecified: Secondary | ICD-10-CM | POA: Insufficient documentation

## 2013-06-07 DIAGNOSIS — E669 Obesity, unspecified: Secondary | ICD-10-CM | POA: Insufficient documentation

## 2013-06-07 DIAGNOSIS — Z86718 Personal history of other venous thrombosis and embolism: Secondary | ICD-10-CM | POA: Insufficient documentation

## 2013-06-07 DIAGNOSIS — K219 Gastro-esophageal reflux disease without esophagitis: Secondary | ICD-10-CM | POA: Insufficient documentation

## 2013-06-07 HISTORY — PX: BREAST REDUCTION SURGERY: SHX8

## 2013-06-07 LAB — POCT I-STAT, CHEM 8
BUN: 14 mg/dL (ref 6–23)
Calcium, Ion: 1.19 mmol/L (ref 1.12–1.23)
Chloride: 99 mEq/L (ref 96–112)
Creatinine, Ser: 0.9 mg/dL (ref 0.50–1.10)
Glucose, Bld: 82 mg/dL (ref 70–99)
HCT: 38 % (ref 36.0–46.0)
Hemoglobin: 12.9 g/dL (ref 12.0–15.0)
Potassium: 3.7 mEq/L (ref 3.7–5.3)
Sodium: 141 mEq/L (ref 137–147)
TCO2: 31 mmol/L (ref 0–100)

## 2013-06-07 SURGERY — MAMMOPLASTY, REDUCTION
Anesthesia: General | Site: Breast | Laterality: Bilateral

## 2013-06-07 MED ORDER — MIDAZOLAM HCL 5 MG/5ML IJ SOLN
INTRAMUSCULAR | Status: DC | PRN
Start: 2013-06-07 — End: 2013-06-07
  Administered 2013-06-07: 2 mg via INTRAVENOUS

## 2013-06-07 MED ORDER — MIDAZOLAM HCL 2 MG/2ML IJ SOLN
INTRAMUSCULAR | Status: AC
  Filled 2013-06-07: qty 2

## 2013-06-07 MED ORDER — PROPOFOL 10 MG/ML IV EMUL
INTRAVENOUS | Status: AC
  Filled 2013-06-07: qty 50

## 2013-06-07 MED ORDER — OXYCODONE HCL 5 MG PO TABS: 5.0000 mg | ORAL_TABLET | Freq: Once | ORAL | Status: DC | PRN

## 2013-06-07 MED ORDER — SUFENTANIL CITRATE 50 MCG/ML IV SOLN
INTRAVENOUS | Status: DC | PRN
  Administered 2013-06-07: 30 ug via INTRAVENOUS
  Administered 2013-06-07 (×2): 10 ug via INTRAVENOUS

## 2013-06-07 MED ORDER — LIDOCAINE HCL (CARDIAC) 20 MG/ML IV SOLN
INTRAVENOUS | Status: DC | PRN
  Administered 2013-06-07: 50 mg via INTRAVENOUS

## 2013-06-07 MED ORDER — SCOPOLAMINE 1 MG/3DAYS TD PT72
MEDICATED_PATCH | TRANSDERMAL | Status: AC
  Filled 2013-06-07: qty 1

## 2013-06-07 MED ORDER — LIDOCAINE HCL 1 % IJ SOLN
INTRAMUSCULAR | Status: DC | PRN
  Administered 2013-06-07 (×2)

## 2013-06-07 MED ORDER — MORPHINE SULFATE 10 MG/ML IJ SOLN
INTRAMUSCULAR | Status: DC | PRN
  Administered 2013-06-07: 1 mg via INTRAVENOUS
  Administered 2013-06-07: 4 mg via INTRAVENOUS

## 2013-06-07 MED ORDER — HYDROMORPHONE HCL PF 1 MG/ML IJ SOLN
INTRAMUSCULAR | Status: AC
  Filled 2013-06-07: qty 1

## 2013-06-07 MED ORDER — CIPROFLOXACIN IN D5W 400 MG/200ML IV SOLN
400.0000 mg | INTRAVENOUS | Status: AC
  Administered 2013-06-07: 400 mg via INTRAVENOUS

## 2013-06-07 MED ORDER — HYDROMORPHONE HCL PF 1 MG/ML IJ SOLN: 0.2500 mg | INTRAMUSCULAR | Status: DC | PRN

## 2013-06-07 MED ORDER — HYDROMORPHONE HCL PF 1 MG/ML IJ SOLN
0.2500 mg | INTRAMUSCULAR | Status: DC | PRN
  Administered 2013-06-07: 0.5 mg via INTRAVENOUS

## 2013-06-07 MED ORDER — DEXAMETHASONE SODIUM PHOSPHATE 4 MG/ML IJ SOLN
INTRAMUSCULAR | Status: DC | PRN
  Administered 2013-06-07: 10 mg via INTRAVENOUS

## 2013-06-07 MED ORDER — OXYCODONE HCL 5 MG/5ML PO SOLN: 5.0000 mg | Freq: Once | ORAL | Status: DC | PRN

## 2013-06-07 MED ORDER — CIPROFLOXACIN IN D5W 400 MG/200ML IV SOLN
INTRAVENOUS | Status: AC
  Filled 2013-06-07: qty 200

## 2013-06-07 MED ORDER — PROPOFOL 10 MG/ML IV BOLUS
INTRAVENOUS | Status: DC | PRN
  Administered 2013-06-07: 150 mg via INTRAVENOUS

## 2013-06-07 MED ORDER — SUFENTANIL CITRATE 50 MCG/ML IV SOLN
INTRAVENOUS | Status: AC
  Filled 2013-06-07: qty 1

## 2013-06-07 MED ORDER — SUCCINYLCHOLINE CHLORIDE 20 MG/ML IJ SOLN
INTRAMUSCULAR | Status: DC | PRN
  Administered 2013-06-07: 100 mg via INTRAVENOUS

## 2013-06-07 MED ORDER — MORPHINE SULFATE 10 MG/ML IJ SOLN
INTRAMUSCULAR | Status: AC
  Filled 2013-06-07: qty 1

## 2013-06-07 MED ORDER — EPHEDRINE SULFATE 50 MG/ML IJ SOLN
INTRAMUSCULAR | Status: DC | PRN
  Administered 2013-06-07: 15 mg via INTRAVENOUS

## 2013-06-07 MED ORDER — FENTANYL CITRATE 0.05 MG/ML IJ SOLN: 50.0000 ug | INTRAMUSCULAR | Status: DC | PRN

## 2013-06-07 MED ORDER — OXYCODONE HCL 5 MG PO TABS
5.0000 mg | ORAL_TABLET | Freq: Once | ORAL | Status: DC | PRN
  Filled 2013-06-07: qty 1

## 2013-06-07 MED ORDER — SODIUM CHLORIDE 0.9 % IR SOLN
Status: DC | PRN
  Administered 2013-06-07: 1000 mL

## 2013-06-07 MED ORDER — SODIUM CHLORIDE 0.9 % IR SOLN
Status: DC | PRN
  Administered 2013-06-07: 11:00:00

## 2013-06-07 MED ORDER — LACTATED RINGERS IV SOLN
INTRAVENOUS | Status: DC
  Administered 2013-06-07 (×3): via INTRAVENOUS

## 2013-06-07 MED ORDER — PROMETHAZINE HCL 25 MG RE SUPP: 25.0000 mg | Freq: Once | RECTAL | Status: DC | PRN

## 2013-06-07 MED ORDER — LIDOCAINE HCL (PF) 1 % IJ SOLN
INTRAMUSCULAR | Status: AC
  Filled 2013-06-07: qty 60

## 2013-06-07 MED ORDER — MIDAZOLAM HCL 2 MG/2ML IJ SOLN: 1.0000 mg | INTRAMUSCULAR | Status: DC | PRN

## 2013-06-07 MED ORDER — SCOPOLAMINE 1 MG/3DAYS TD PT72
1.0000 | MEDICATED_PATCH | TRANSDERMAL | Status: DC
  Administered 2013-06-07: 1.5 mg via TRANSDERMAL

## 2013-06-07 SURGICAL SUPPLY — 62 items
ADH SKN CLS APL DERMABOND .7 (GAUZE/BANDAGES/DRESSINGS) ×4
BAG DECANTER FOR FLEXI CONT (MISCELLANEOUS) ×2 IMPLANT
BINDER BREAST LRG (GAUZE/BANDAGES/DRESSINGS) IMPLANT
BINDER BREAST MEDIUM (GAUZE/BANDAGES/DRESSINGS) IMPLANT
BINDER BREAST XLRG (GAUZE/BANDAGES/DRESSINGS) ×2 IMPLANT
BINDER BREAST XXLRG (GAUZE/BANDAGES/DRESSINGS) IMPLANT
BIOPATCH RED 1 DISK 7.0 (GAUZE/BANDAGES/DRESSINGS) ×4 IMPLANT
BLADE HEX COATED 2.75 (ELECTRODE) ×2 IMPLANT
BLADE KNIFE PERSONA 10 (BLADE) ×7 IMPLANT
BLADE SURG 15 STRL LF DISP TIS (BLADE) ×2 IMPLANT
BLADE SURG 15 STRL SS (BLADE) ×4
BNDG GAUZE ELAST 4 BULKY (GAUZE/BANDAGES/DRESSINGS) ×4 IMPLANT
CANISTER SUCT 1200ML W/VALVE (MISCELLANEOUS) ×2 IMPLANT
CHLORAPREP W/TINT 26ML (MISCELLANEOUS) ×2 IMPLANT
COVER MAYO STAND STRL (DRAPES) ×2 IMPLANT
COVER SURGICAL LIGHT HANDLE (MISCELLANEOUS) ×1 IMPLANT
COVER TABLE BACK 60X90 (DRAPES) ×2 IMPLANT
DECANTER SPIKE VIAL GLASS SM (MISCELLANEOUS) IMPLANT
DERMABOND ADVANCED (GAUZE/BANDAGES/DRESSINGS) ×4
DERMABOND ADVANCED .7 DNX12 (GAUZE/BANDAGES/DRESSINGS) ×2 IMPLANT
DRAIN CHANNEL 10M FLAT 3/4 FLT (DRAIN) IMPLANT
DRAIN CHANNEL 19F RND (DRAIN) ×2 IMPLANT
DRAPE LAPAROSCOPIC ABDOMINAL (DRAPES) ×2 IMPLANT
ELECT BLADE 4.0 EZ CLEAN MEGAD (MISCELLANEOUS)
ELECT BLADE 6.5 .24CM SHAFT (ELECTRODE) IMPLANT
ELECT REM PT RETURN 9FT ADLT (ELECTROSURGICAL) ×2
ELECTRODE BLDE 4.0 EZ CLN MEGD (MISCELLANEOUS) IMPLANT
ELECTRODE REM PT RTRN 9FT ADLT (ELECTROSURGICAL) ×1 IMPLANT
EVACUATOR SILICONE 100CC (DRAIN) ×2 IMPLANT
FILTER LIPOSUCTION (MISCELLANEOUS) ×2 IMPLANT
GLOVE BIO SURGEON STRL SZ 6.5 (GLOVE) ×7 IMPLANT
GLOVE SURG SS PI 7.0 STRL IVOR (GLOVE) ×2 IMPLANT
GOWN STRL REUS W/ TWL LRG LVL3 (GOWN DISPOSABLE) ×2 IMPLANT
GOWN STRL REUS W/TWL LRG LVL3 (GOWN DISPOSABLE) ×8
KIT FILL SYSTEM UNIVERSAL (SET/KITS/TRAYS/PACK) ×1 IMPLANT
LINER CANISTER 1000CC FLEX (MISCELLANEOUS) ×2 IMPLANT
NDL HYPO 25X1 1.5 SAFETY (NEEDLE) IMPLANT
NDL SPNL 18GX3.5 QUINCKE PK (NEEDLE) ×1 IMPLANT
NEEDLE HYPO 25X1 1.5 SAFETY (NEEDLE) IMPLANT
NEEDLE SPNL 18GX3.5 QUINCKE PK (NEEDLE) ×2 IMPLANT
NS IRRIG 1000ML POUR BTL (IV SOLUTION) ×2 IMPLANT
PACK BASIN DAY SURGERY FS (CUSTOM PROCEDURE TRAY) ×2 IMPLANT
PAD ABD 8X10 STRL (GAUZE/BANDAGES/DRESSINGS) ×6 IMPLANT
PENCIL BUTTON HOLSTER BLD 10FT (ELECTRODE) ×2 IMPLANT
SLEEVE SCD COMPRESS KNEE MED (MISCELLANEOUS) ×2 IMPLANT
SLEEVE SURGEON STRL (DRAPES) ×1 IMPLANT
SPONGE LAP 18X18 X RAY DECT (DISPOSABLE) ×9 IMPLANT
SUT MNCRL AB 4-0 PS2 18 (SUTURE) ×4 IMPLANT
SUT MON AB 5-0 PS2 18 (SUTURE) ×7 IMPLANT
SUT PDS AB 2-0 CT2 27 (SUTURE) IMPLANT
SUT SILK 3 0 PS 1 (SUTURE) ×2 IMPLANT
SUT VIC AB 3-0 SH 27 (SUTURE) ×6
SUT VIC AB 3-0 SH 27X BRD (SUTURE) ×2 IMPLANT
SUT VICRYL 4-0 PS2 18IN ABS (SUTURE) ×7 IMPLANT
SYR 50ML LL SCALE MARK (SYRINGE) ×2 IMPLANT
SYR BULB IRRIGATION 50ML (SYRINGE) ×2 IMPLANT
SYR CONTROL 10ML LL (SYRINGE) IMPLANT
TOWEL OR 17X24 6PK STRL BLUE (TOWEL DISPOSABLE) ×5 IMPLANT
TUBE CONNECTING 20X1/4 (TUBING) ×2 IMPLANT
TUBING SET GRADUATE ASPIR 12FT (MISCELLANEOUS) ×2 IMPLANT
UNDERPAD 30X30 INCONTINENT (UNDERPADS AND DIAPERS) ×4 IMPLANT
YANKAUER SUCT BULB TIP NO VENT (SUCTIONS) ×2 IMPLANT

## 2013-06-07 NOTE — Anesthesia Procedure Notes (Signed)
Procedure Name: Intubation Date/Time: 06/07/2013 8:47 AM Performed by: Melynda Ripple D Pre-anesthesia Checklist: Patient identified, Emergency Drugs available, Suction available and Patient being monitored Patient Re-evaluated:Patient Re-evaluated prior to inductionOxygen Delivery Method: Circle System Utilized Preoxygenation: Pre-oxygenation with 100% oxygen Intubation Type: IV induction Ventilation: Mask ventilation without difficulty Laryngoscope Size: Mac and 3 Grade View: Grade I Tube type: Oral Tube size: 7.0 mm Number of attempts: 1 Airway Equipment and Method: stylet and oral airway Placement Confirmation: ETT inserted through vocal cords under direct vision,  positive ETCO2 and breath sounds checked- equal and bilateral Secured at: 23 cm Tube secured with: Tape Dental Injury: Teeth and Oropharynx as per pre-operative assessment

## 2013-06-07 NOTE — Transfer of Care (Signed)
Immediate Anesthesia Transfer of Care Note  Patient: Katrina Fernandez  Procedure(s) Performed: Procedure(s): BILATERAL MAMMARY REDUCTION  (BREAST) (Bilateral)  LIPOSUCTION (Bilateral)  Patient Location: PACU  Anesthesia Type:General  Level of Consciousness: awake, alert  and oriented  Airway & Oxygen Therapy: Patient Spontanous Breathing and Patient connected to face mask oxygen  Post-op Assessment: Report given to PACU RN and Post -op Vital signs reviewed and stable  Post vital signs: Reviewed and stable  Complications: No apparent anesthesia complications

## 2013-06-07 NOTE — Anesthesia Preprocedure Evaluation (Signed)
Anesthesia Evaluation  Patient identified by MRN, date of birth, ID band Patient awake    Reviewed: Allergy & Precautions, H&P , NPO status , Patient's Chart, lab work & pertinent test results  Airway Mallampati: I TM Distance: >3 FB Neck ROM: Full    Dental no notable dental hx. (+) Teeth Intact and Dental Advisory Given   Pulmonary neg pulmonary ROS,  breath sounds clear to auscultation  Pulmonary exam normal       Cardiovascular negative cardio ROS  Rhythm:Regular Rate:Normal     Neuro/Psych Depression negative neurological ROS     GI/Hepatic Neg liver ROS, GERD-  Poorly Controlled and Medicated,  Endo/Other  Morbid obesity  Renal/GU negative Renal ROS  negative genitourinary   Musculoskeletal   Abdominal   Peds  Hematology negative hematology ROS (+)   Anesthesia Other Findings   Reproductive/Obstetrics negative OB ROS                           Anesthesia Physical Anesthesia Plan  ASA: III  Anesthesia Plan: General   Post-op Pain Management:    Induction: Intravenous, Rapid sequence and Cricoid pressure planned  Airway Management Planned: Oral ETT  Additional Equipment:   Intra-op Plan:   Post-operative Plan: Extubation in OR  Informed Consent: I have reviewed the patients History and Physical, chart, labs and discussed the procedure including the risks, benefits and alternatives for the proposed anesthesia with the patient or authorized representative who has indicated his/her understanding and acceptance.   Dental advisory given  Plan Discussed with: CRNA  Anesthesia Plan Comments:         Anesthesia Quick Evaluation

## 2013-06-07 NOTE — H&P (View-Only) (Signed)
Samiya Mervin Teehan  05/30/2013 10:30 AM   Office Visit  MRN:  [id number]  Department: Plastic Surgery  Dept Phone: [phone]  Description: Female DOB: Oct 16, 1965  Provider: Theodoro Kos, DO    Diagnoses    Symptomatic mammary hypertrophy    -  Primary   611.1      Reason for Visit -  Breast Reduction     Vitals - Last Recorded    116/79  92  1.6 m (5\' 3" )  98.612 kg (217 lb 6.4 oz)  38.52 kg/m2       Subjective:    Patient ID: Emanuela Runnion is a 48 y.o. female.  HPI The patient is a 48 y.o. year old female here for a history and physical for a bilateral breast reduction. She is 5 ft 3 in and weighs 210 lbs and current bra size is 40D. She does not have a family history of breast cancer. Her other medical history is remarkable for chronic back and neck pain, obesity, depression, GERD, ankle pain, baker's cyst of knee, superficial vein thrombosis.   Her surgical history is remarkable for neck surgery, back surgery, appendectomy, hysterectomy, cholecystectomy and lap band x 2.   She has had a previous mammogram.  Her breast exam reveals a normal exam with no palpable masses. On physical exam she has grade 3 ptosis. The rest of the physical exam is remarkable for well healed scars. After discussing the treatment options the patient has elected for breast reduction. The technique to be used for the breast reduction is an inferior pedical technique. The desired size of the breast is in the B-C range.  The following portions of the patient's history were reviewed and updated as appropriate: allergies, current medications, past family history, past medical history, past social history, past surgical history and problem list.  Review of Systems  Constitutional: Negative.   HENT: Negative.   Eyes: Negative.   Respiratory: Negative.   Cardiovascular: Negative.   Gastrointestinal: Negative.   Endocrine: Negative.   Genitourinary: Negative.   Neurological: Negative.   Hematological:  Negative.   Psychiatric/Behavioral: Negative.       Objective:    Physical Exam  Constitutional: She appears well-developed and well-nourished.  HENT:   Head: Normocephalic and atraumatic.  Eyes: Conjunctivae and EOM are normal. Pupils are equal, round, and reactive to light.  Cardiovascular: Normal rate.   Pulmonary/Chest: Effort normal.  Abdominal: Soft.  Musculoskeletal: Normal range of motion.  Neurological: She is alert.  Skin: Skin is warm.  Psychiatric: She has a normal mood and affect. Her behavior is normal. Judgment and thought content normal.      Assessment:   1.  Symptomatic mammary hypertrophy        Plan:     The consent was obtained with risks and complications reviewed which included bleeding, pain, scar, infection and the risk of anesthesia.  The patients questions were answered to the patients expressed satisfaction.  Plan for bilateral breast reduction.  Medications Ordered This Encounter     HYDROcodone-acetaminophen (NORCO) 5-325 mg per tablet 30 Take 1 tablet by mouth every 6 (six) hours as needed for up to 10 days for Pain.    ondansetron (ZOFRAN, AS HYDROCHLORIDE,) 4 MG tablet Take 1 tablet (4 mg total) by mouth every 8 (eight) hours as needed for up to 7 days for Nausea / Vomiting. - Oral    cephalexin (KEFLEX) 500 MG capsule 28  Take 1 capsule (500 mg total) by mouth 4  times daily.    enoxaparin (LOVENOX) 30 mg/0.3 mL Syrg injection  40 units daily for 7 days     Discontinued Medications      morphine (MS CONTIN) 15 MG 12 hr tablet Completed Therapy    morphine (MS CONTIN) 60 MG 12 hr tablet Completed Therapy    clotrimazole (MYCELEX) 10 mg troche Completed Therapy    meloxicam (MOBIC) 15 MG tablet Completed Therapy

## 2013-06-07 NOTE — Anesthesia Postprocedure Evaluation (Signed)
  Anesthesia Post-op Note  Patient: Katrina Fernandez  Procedure(s) Performed: Procedure(s): BILATERAL MAMMARY REDUCTION  (BREAST) (Bilateral)  LIPOSUCTION (Bilateral)  Patient Location: PACU  Anesthesia Type:General  Level of Consciousness: awake and alert   Airway and Oxygen Therapy: Patient Spontanous Breathing  Post-op Pain: moderate  Post-op Assessment: Post-op Vital signs reviewed, Patient's Cardiovascular Status Stable and Respiratory Function Stable  Post-op Vital Signs: Reviewed  Filed Vitals:   06/07/13 1308  BP:   Pulse: 104  Temp:   Resp: 16    Complications: No apparent anesthesia complications

## 2013-06-07 NOTE — Interval H&P Note (Signed)
History and Physical Interval Note:  06/07/2013 8:26 AM  Katrina Fernandez  has presented today for surgery, with the diagnosis of BILATERAL MAMMARY HYPERTROPHY  The various methods of treatment have been discussed with the patient and family. After consideration of risks, benefits and other options for treatment, the patient has consented to  Procedure(s): BILATERAL MAMMARY REDUCTION  (BREAST) (Bilateral) POSSIBLE LIPOSUCTION (Bilateral) as a surgical intervention .  The patient's history has been reviewed, patient examined, no change in status, stable for surgery.  I have reviewed the patient's chart and labs.  Questions were answered to the patient's satisfaction.     SANGER,CLAIRE

## 2013-06-07 NOTE — Brief Op Note (Signed)
06/07/2013  12:24 PM  PATIENT:  Katrina Fernandez  48 y.o. female  PRE-OPERATIVE DIAGNOSIS:  BILATERAL MAMMARY HYPERTROPHY  POST-OPERATIVE DIAGNOSIS:  BILATERAL MAMMARY HYPERTROPHY  PROCEDURE:  Procedure(s): BILATERAL MAMMARY REDUCTION  (BREAST) (Bilateral)  LIPOSUCTION (Bilateral)  SURGEON:  Surgeon(s) and Role:    * Kynsleigh Westendorf Sanger, DO - Primary  PHYSICIAN ASSISTANT: Shawn Rayburn, PA  ASSISTANTS: none   ANESTHESIA:   local and general  EBL:  Total I/O In: 1800 [I.V.:1800] Out: -   BLOOD ADMINISTERED:none  DRAINS: (2) Jackson-Pratt drain(s) with closed bulb suction in the breast pocket (1 one each side)   LOCAL MEDICATIONS USED:  LIDOCAINE   SPECIMEN:  Breast tissue  DISPOSITION OF SPECIMEN:  PATHOLOGY  COUNTS:  YES  TOURNIQUET:  * No tourniquets in log *  DICTATION: .Dragon Dictation  PLAN OF CARE: Discharge to home after PACU  PATIENT DISPOSITION:  PACU - hemodynamically stable.   Delay start of Pharmacological VTE agent (>24hrs) due to surgical blood loss or risk of bleeding: no

## 2013-06-07 NOTE — Op Note (Signed)
Breast Reduction Op note:    DATE OF PROCEDURE: 06/07/2013  LOCATION: [place]  SURGEON: Lyndee Leo Sanger  ASSISTANT: Shawn Rayburn, PA  PREOPERATIVE DIAGNOSIS 1. Macromastia 2. Neck Pain 3. Back Pain  POSTOPERATIVE DIAGNOSIS 1. Macromastia 2. Neck Pain 3. Back Pain  PROCEDURES 1. Bilateral breast reduction.  Right reduction 861g, Left reduction 188C  COMPLICATIONS: None.  DRAINS: one in each breast pocket  INDICATIONS FOR PROCEDURE The patient is a 48 y.o. year-old female with a history of symptomatic macromastia with concominant back pain, neck pain, shoulder grooving from her bra.    CONSENT Informed consent was obtained directly from the patient. The risks, benefits and alternatives were fully discussed. Specific risks including but not limited to bleeding, infection, hematoma, seroma, scarring, pain, nipple necrosis, asymmetry, poor cosmetic results, and need for further surgery were discussed. The patient had ample opportunity to have her questions answered to her satisfaction.  DESCRIPTION OF PROCEDURE  Patient was brought into the operating room and placed in a supine position.  SCDs were placed and appropriate padding was performed.  Antibiotics were given. The patient underwent general anesthesia and the chest was prepped and draped in a sterile fashion.  A timeout was performed and all information was confirmed to be correct.  Please note that the following procedure described below was performed bilaterally.  Preoperative markings were confirmed.  Incision lines were injected with 1% Xylocaine with epinephrine.  After waiting for vasoconstriction, the marked lines were incised.  An inferior pedical breast reduction was performed by de-epithelializing the pedicle, using bovie to create the lateral and medial pedicles, and removing breast tissue from the superior, lateral, and medial portions of the breast.  Care was taken to not undermine the  breast pedicle. Hemostasis was achieved.  The nipple was gently rotated into position and the skin was temporarily closed with staples.  The patient was sat upright and size and shape symmetry was confirmed.  The pocket was irrigated, a drain was placed, and hemostasis confirmed.  The deep tissues were approximated with 3-0 Vicryl sutures and the skin was closed with deep dermal and subcuticular 4-0 Monocryl sutures followed by 5-0 Monocryl.  The tumescent was used for the bra line at the midaxillary region.  The liposuction was used to achieve reduction laterally and better symmetry.  The nipple areola complex was brought out with the skin de-epithelialized at the location to make place for the complex.  The area was secured with 4-0 Vicryl at the deep layers followed by 5-0 Monocryl.  The nipple and skin flaps had good capillary refill at the end of the procedure. The patient tolerated the procedure well. The patient was allowed to wake from anesthesia and taken to the recovery room in satisfactory condition.

## 2013-06-07 NOTE — Discharge Instructions (Signed)
Drain Care Continue binder or sports bra Sink bath     Call your surgeon if you experience:   1.  Fever over 101.0. 2.  Inability to urinate. 3.  Nausea and/or vomiting. 4.  Extreme swelling or bruising at the surgical site. 5.  Continued bleeding from the incision. 6.  Increased pain, redness or drainage from the incision. 7.  Problems related to your pain medication.  Post Anesthesia Home Care Instructions  Activity: Get plenty of rest for the remainder of the day. A responsible adult should stay with you for 24 hours following the procedure.  For the next 24 hours, DO NOT: -Drive a car -Paediatric nurse -Drink alcoholic beverages -Take any medication unless instructed by your physician -Make any legal decisions or sign important papers.  Meals: Start with liquid foods such as gelatin or soup. Progress to regular foods as tolerated. Avoid greasy, spicy, heavy foods. If nausea and/or vomiting occur, drink only clear liquids until the nausea and/or vomiting subsides. Call your physician if vomiting continues.  Special Instructions/Symptoms: Your throat may feel dry or sore from the anesthesia or the breathing tube placed in your throat during surgery. If this causes discomfort, gargle with warm salt water. The discomfort should disappear within 24 hours. About my Jackson-Pratt Bulb Drain  What is a Jackson-Pratt bulb? A Jackson-Pratt is a soft, round device used to collect drainage. It is connected to a long, thin drainage catheter, which is held in place by one or two small stiches near your surgical incision site. When the bulb is squeezed, it forms a vacuum, forcing the drainage to empty into the bulb.  Emptying the Jackson-Pratt bulb- To empty the bulb: 1. Release the plug on the top of the bulb. 2. Pour the bulb's contents into a measuring container which your nurse will provide. 3. Record the time emptied and amount of drainage. Empty the drain(s) as often as your      doctor or nurse recommends.  Date                  Time                    Amount (Drain 1)                 Amount (Drain 2)  _____________________________________________________________________  _____________________________________________________________________  _____________________________________________________________________  _____________________________________________________________________  _____________________________________________________________________  _____________________________________________________________________  _____________________________________________________________________  _____________________________________________________________________  Squeezing the Jackson-Pratt Bulb- To squeeze the bulb: 1. Make sure the plug at the top of the bulb is open. 2. Squeeze the bulb tightly in your fist. You will hear air squeezing from the bulb. 3. Replace the plug while the bulb is squeezed. 4. Use a safety pin to attach the bulb to your clothing. This will keep the catheter from     pulling at the bulb insertion site.  When to call your doctor- Call your doctor if:  Drain site becomes red, swollen or hot.  You have a fever greater than 101 degrees F.  There is oozing at the drain site.  Drain falls out (apply a guaze bandage over the drain hole and secure it with tape).  Drainage increases daily not related to activity patterns. (You will usually have more drainage when you are active than when you are resting.)  Drainage has a bad odor.

## 2013-06-08 ENCOUNTER — Encounter (HOSPITAL_BASED_OUTPATIENT_CLINIC_OR_DEPARTMENT_OTHER): Payer: Self-pay | Admitting: Plastic Surgery

## 2013-06-19 ENCOUNTER — Telehealth: Payer: Self-pay | Admitting: *Deleted

## 2013-06-19 ENCOUNTER — Encounter: Payer: Self-pay | Admitting: Family Medicine

## 2013-06-19 ENCOUNTER — Ambulatory Visit (INDEPENDENT_AMBULATORY_CARE_PROVIDER_SITE_OTHER): Payer: Medicare Other | Admitting: Family Medicine

## 2013-06-19 VITALS — BP 108/69 | HR 91 | Wt 218.0 lb

## 2013-06-19 DIAGNOSIS — R609 Edema, unspecified: Secondary | ICD-10-CM

## 2013-06-19 DIAGNOSIS — B3789 Other sites of candidiasis: Secondary | ICD-10-CM

## 2013-06-19 DIAGNOSIS — R600 Localized edema: Secondary | ICD-10-CM

## 2013-06-19 MED ORDER — NYSTATIN 100000 UNIT/GM EX POWD: CUTANEOUS | Status: DC

## 2013-06-19 NOTE — Progress Notes (Signed)
CC: Katrina Fernandez is a 48 y.o. female is here for Leg Swelling   Subjective: HPI:  Reports bilateral lower extremity edema originally started in the right foot now she feels it is symmetric involving the hindfoot up into the shin region. It has been slowly worsening since her surgery around 2 weeks ago. She was on Lovenox for 7 days after the procedure. Currently not on any antiplatelet or anticoagulation. Symptoms do not seem to fluctuate with time of day. She denies swelling elsewhere but does feel mild bloating ever since the breast reconstruction surgery.  Denies any change to her sodium intake. Denies any changes to her medications that occurred around the time of the above complaint.  Nothing particularly makes symptoms better or worse. She has a history of a vein thrombosis.  Denies shortness of breath, fevers, chills, chest pain, irregular heartbeat, nor any skin changes overlying the site of the swelling. No interventions as of yet  She complains of a rash that is localized on her torso mostly around her breasts and in a pattern of a bra line. No interventions as of yet has been present for about a week. It is moderately itchy otherwise it does not bother her. It is nowhere else on her body.   Review Of Systems Outlined In HPI  Past Medical History  Diagnosis Date  . Hyperlipidemia   . Spinal stenosis   . GERD (gastroesophageal reflux disease) 01/28/2012  . Right ankle pain 01/28/2012  . Depression 01/28/2012  . DVT (deep venous thrombosis)   . Edema leg     bilat     Family History  Problem Relation Age of Onset  . Depression    . Colon cancer Father   . Ovarian cancer Maternal Grandmother   . Pancreatic cancer Maternal Grandfather      History  Substance Use Topics  . Smoking status: Never Smoker   . Smokeless tobacco: Not on file  . Alcohol Use: No     Objective: Filed Vitals:   06/19/13 1535  BP: 108/69  Pulse: 91    General: Alert and Oriented, No Acute  Distress HEENT: Pupils equal, round, reactive to light. Conjunctivae clear.  Moist mucous membranes pharynx unremarkable Lungs: Clear to auscultation bilaterally, no wheezing/ronchi/rales.  Comfortable work of breathing. Good air movement. Cardiac: Regular rate and rhythm. Normal S1/S2.  No murmurs, rubs, nor gallops.   Extremities: 1+ nonpitting edema bilaterally from the ankle to the shins. Circumference of the right calf at the tibial tuberosity is 39 cm compared to 41 cm on the left. Circumference around ankles are identical..  Strong peripheral pulses.  Mental Status: No depression, anxiety, nor agitation. Skin: Warm and dry. Macular papular rash on the lateral breasts and also between the 2 breasts in a pattern of patches with satellite lesions. There is no redness warmth or discoloration overlying the lower legs  Assessment & Plan: Heddy was seen today for leg swelling.  Diagnoses and associated orders for this visit:  Lower extremity edema - B Nat Peptide - BASIC METABOLIC PANEL WITH GFR - US Venous Img Lower Bilateral; Future  Candidiasis of breast - nystatin (MYCOSTATIN/NYSTOP) 100000 UNIT/GM POWD; Apply to rash on torso three times a day for one week.    Lower extremity edema: Rule out renal insufficiency and heart failure with the above labs we had sent her directly to [facility] radiology offices for DVT rule out with Dopplers.  She's given samples of 15 mg xeralto to be  used only if clot is confirmed on ultrasound after hours today. Breast candidiasis: Start nystatin powder  Ultimate plan will be determined based on the above results  Return if symptoms worsen or fail to improve.

## 2013-06-19 NOTE — Telephone Encounter (Signed)
Pt's call was transferred to my vm from triage line. Message states she has a hx of blood clots and she had breast reduction surgery 2 weeks ago. She is now off lovenox and her feet are very swollen. Pt wanted to know what she should do. I did call the pt back and ask that she schedule an appt for today

## 2013-06-19 NOTE — Telephone Encounter (Signed)
Called [employer] medicare complete and no prior auth needed for US Venous IMG Bilateral Lower Ext (cpt code [phone]). Pt has been instructed to go to [facility] where they will work her in today. She has the order with her that has Dr. Lajoyce Lauber cell number for them to call prelim report. Pt also has instruction from Dr. Ileene Rubens.

## 2013-06-19 NOTE — Progress Notes (Addendum)
Notified (by "Altamese Dilling") that prelim report found no evidence of DVT.  Asked her to reiterate to May not to take xeralto since no sign of DVT.

## 2013-06-19 NOTE — Telephone Encounter (Signed)
Pt was scheduled for ann appt  This afternoon to check on swollen legs after having had breast reduction surgery two weeks ago and being off lovenox. I called pt back to see if she was able to come n the am but she said she couldn't because she had to go see her son in the hospital.

## 2013-06-20 ENCOUNTER — Telehealth: Payer: Self-pay | Admitting: Family Medicine

## 2013-06-20 DIAGNOSIS — R609 Edema, unspecified: Secondary | ICD-10-CM | POA: Insufficient documentation

## 2013-06-20 DIAGNOSIS — M25473 Effusion, unspecified ankle: Secondary | ICD-10-CM

## 2013-06-20 LAB — BASIC METABOLIC PANEL WITH GFR
BUN: 13 mg/dL (ref 6–23)
CO2: 32 mEq/L (ref 19–32)
Calcium: 9.2 mg/dL (ref 8.4–10.5)
Chloride: 100 mEq/L (ref 96–112)
Creat: 0.77 mg/dL (ref 0.50–1.10)
GFR, Est African American: >89 mL/min
GFR, Est Non African American: >89 mL/min
Glucose, Bld: 113 mg/dL — ABNORMAL HIGH (ref 70–99)
Potassium: 4.1 mEq/L (ref 3.5–5.3)
Sodium: 145 mEq/L (ref 135–145)

## 2013-06-20 LAB — BRAIN NATRIURETIC PEPTIDE: Brain Natriuretic Peptide: 59.1 pg/mL (ref 0.0–100.0)

## 2013-06-20 MED ORDER — FUROSEMIDE 20 MG PO TABS: ORAL_TABLET | ORAL | Status: DC

## 2013-06-20 NOTE — Telephone Encounter (Signed)
Seth Bake, Will you please let Katrina Fernandez know that her labs and imaging have ruled out a DVT, kidney faiulre, and heart failure.  I would recommend using her compression stockings on a daily basis and she can also start an as needed dose of furosemide every morning that I've sent over to [facility].  I'd encourage her to f/u with me in a few weeks to see how she's responding.

## 2013-06-20 NOTE — Telephone Encounter (Signed)
Left detailed message on vm.

## 2013-07-05 NOTE — Telephone Encounter (Signed)
closed

## 2013-07-10 ENCOUNTER — Encounter: Payer: Self-pay | Admitting: Family Medicine

## 2013-07-15 ENCOUNTER — Other Ambulatory Visit: Payer: Self-pay | Admitting: Family Medicine

## 2013-07-24 ENCOUNTER — Encounter: Payer: Self-pay | Admitting: Family Medicine

## 2013-07-24 ENCOUNTER — Ambulatory Visit (INDEPENDENT_AMBULATORY_CARE_PROVIDER_SITE_OTHER): Payer: Medicare Other | Admitting: Family Medicine

## 2013-07-24 VITALS — BP 124/68 | HR 95 | Wt 213.0 lb

## 2013-07-24 DIAGNOSIS — M25473 Effusion, unspecified ankle: Secondary | ICD-10-CM

## 2013-07-24 DIAGNOSIS — M25476 Effusion, unspecified foot: Secondary | ICD-10-CM

## 2013-07-24 MED ORDER — FUROSEMIDE 20 MG PO TABS: ORAL_TABLET | ORAL | Status: DC

## 2013-07-24 NOTE — Progress Notes (Signed)
CC: Katrina Fernandez is a 48 y.o. female is here for Foot Swelling   Subjective: HPI:  Complains of bilateral foot swelling with ankle swelling that is symmetric accompanied with pain both of which are moderate in severity. Improved in the mornings progressively worsened throughout the day. Initially both issues were 100% resolved taking 20 mg of Lasix daily.  Over the past 2 weeks she has been on her feet much more often taking care of her new adopted newborn.  She's unable to wear compression stockings due to pain.  She's watching her sodium intake and tries to keep her feet elevated as much as possible. Denies shortness of breath, wheezing, bloating, nor any urinary complaints. Denies orthopnea  Review Of Systems Outlined In HPI  Past Medical History  Diagnosis Date  . Hyperlipidemia   . Spinal stenosis   . GERD (gastroesophageal reflux disease) 01/28/2012  . Right ankle pain 01/28/2012  . Depression 01/28/2012  . DVT (deep venous thrombosis)   . Edema leg     bilat    Past Surgical History  Procedure Laterality Date  . Back surgery      7 back surgeries  . Neck surgery  2006  . Laparoscopic gastric banding    . Carpal tunnel release      both  . Total abdominal hysterectomy  1994  . Appendectomy    . Cholecystectomy    . Laparoscopic gastric banding    . Breast reduction surgery Bilateral 06/07/2013    Procedure: BILATERAL MAMMARY REDUCTION  (BREAST);  Surgeon: Theodoro Kos, DO;  Location: [place];  Service: Plastics;  Laterality: Bilateral;  . Breast reduction surgery Bilateral 06/07/2013    Procedure:  LIPOSUCTION;  Surgeon: Theodoro Kos, DO;  Location: [place];  Service: Plastics;  Laterality: Bilateral;   Family History  Problem Relation Age of Onset  . Depression    . Colon cancer Father   . Ovarian cancer Maternal Grandmother   . Pancreatic cancer Maternal Grandfather     History   Social History  . Marital Status: Married     Spouse Name: N/A    Number of Children: N/A  . Years of Education: N/A   Occupational History  . Not on file.   Social History Main Topics  . Smoking status: Never Smoker   . Smokeless tobacco: Not on file  . Alcohol Use: No  . Drug Use: No  . Sexual Activity: Yes   Other Topics Concern  . Not on file   Social History Narrative  . No narrative on file     Objective: BP 124/68  Pulse 95  Wt 213 lb (96.616 kg)  General: Alert and Oriented, No Acute Distress HEENT: Pupils equal, round, reactive to light. Conjunctivae clear.  Moist mucous membranes pharynx unremarkable Lungs: Clear to auscultation bilaterally, no wheezing/ronchi/rales.  Comfortable work of breathing. Good air movement. Cardiac: Regular rate and rhythm. Normal S1/S2.  No murmurs, rubs, nor gallops.   Extremities: 1+ pitting edema bilaterally on the ankles, bilateral and symmetrical.  Strong peripheral pulses.  Nontender varicose veins left lateral thigh Mental Status: No depression, anxiety, nor agitation. Skin: Warm and dry.  Assessment & Plan: Katrina Fernandez was seen today for foot swelling.  Diagnoses and associated orders for this visit:  Bilateral Ankle swelling - furosemide (LASIX) 20 MG tablet; Two by mouth every morning and one in the afternoon only as needed for leg swelling.    Bilateral ankle swelling: I still suspect  this is due to venous insufficiency increasing her Lasix regimen in hopes that she'll be able to comfortably get into her compression stockings first thing in the morning. I encouraged her to do this on a daily basis the next 3 months if not improving will refer to vascular    Return if symptoms worsen or fail to improve.

## 2013-09-25 ENCOUNTER — Ambulatory Visit (INDEPENDENT_AMBULATORY_CARE_PROVIDER_SITE_OTHER): Payer: Medicare Other | Admitting: Family Medicine

## 2013-09-25 ENCOUNTER — Encounter: Payer: Self-pay | Admitting: Family Medicine

## 2013-09-25 VITALS — BP 104/66 | HR 87 | Wt 210.0 lb

## 2013-09-25 DIAGNOSIS — M25476 Effusion, unspecified foot: Secondary | ICD-10-CM

## 2013-09-25 DIAGNOSIS — M722 Plantar fascial fibromatosis: Secondary | ICD-10-CM

## 2013-09-25 DIAGNOSIS — I872 Venous insufficiency (chronic) (peripheral): Secondary | ICD-10-CM | POA: Insufficient documentation

## 2013-09-25 DIAGNOSIS — M7712 Lateral epicondylitis, left elbow: Secondary | ICD-10-CM

## 2013-09-25 DIAGNOSIS — M7710 Lateral epicondylitis, unspecified elbow: Secondary | ICD-10-CM

## 2013-09-25 DIAGNOSIS — M25473 Effusion, unspecified ankle: Secondary | ICD-10-CM

## 2013-09-25 NOTE — Progress Notes (Signed)
CC: Katrina Fernandez is a 48 y.o. female is here for Leg Swelling   Subjective: HPI:  Daughter Katrina Fernandez  Complains of bilateral ankle swelling has been present for the past months slightly improved with taking Lasix 20 mg twice a day however in tolerable urination frequency she takes 40 mg at once. Symptoms are mild in the morning however moderate to severe in the afternoon. Symptoms are elevation of feet.  Accompanied by tenderness and burning sensation moderate in severity at locations of varicose veins. She believes her varicose veins appearance and sensation is worsening since I saw her last. Denies shortness of breath, orthopnea, edema elsewhere, rapid heartbeat, cough chest pain  Complains of left elbow pain localized to the lateral elbow  that has been present for about one month, present on a daily basis described a sharpness it is nonradiating. Worse when carrying her child were extending the arm. No overlying skin changes nor swelling in the joint. Denies recent or remote trauma or overexertion.  Right foot pain localized to the heel that has been present for the past one to 2 weeks. Described as a sharpness that is radiating forward toward the toes and the plantar surface. Worse when standing on the heel. Denies any recent over exertion or trauma. Denies overlying skin changes or bruising.  No interventions as of yet    Review Of Systems Outlined In HPI  Past Medical History  Diagnosis Date  . Hyperlipidemia   . Spinal stenosis   . GERD (gastroesophageal reflux disease) 01/28/2012  . Right ankle pain 01/28/2012  . Depression 01/28/2012  . DVT (deep venous thrombosis)   . Edema leg     bilat    Past Surgical History  Procedure Laterality Date  . Back surgery      7 back surgeries  . Neck surgery  2006  . Laparoscopic gastric banding    . Carpal tunnel release      both  . Total abdominal hysterectomy  1994  . Appendectomy    . Cholecystectomy    . Laparoscopic gastric  banding    . Breast reduction surgery Bilateral 06/07/2013    Procedure: BILATERAL MAMMARY REDUCTION  (BREAST);  Surgeon: Theodoro Kos, DO;  Location: [facility];  Service: Plastics;  Laterality: Bilateral;  . Breast reduction surgery Bilateral 06/07/2013    Procedure:  LIPOSUCTION;  Surgeon: Theodoro Kos, DO;  Location: [facility];  Service: Plastics;  Laterality: Bilateral;   Family History  Problem Relation Age of Onset  . Depression    . Colon cancer Father   . Ovarian cancer Maternal Grandmother   . Pancreatic cancer Maternal Grandfather     History   Social History  . Marital Status: Married    Spouse Name: N/A    Number of Children: N/A  . Years of Education: N/A   Occupational History  . Not on file.   Social History Main Topics  . Smoking status: Never Smoker   . Smokeless tobacco: Not on file  . Alcohol Use: No  . Drug Use: No  . Sexual Activity: Yes   Other Topics Concern  . Not on file   Social History Narrative  . No narrative on file     Objective: BP 104/66  Pulse 87  Wt 210 lb (95.255 kg)  General: Alert and Oriented, No Acute Distress HEENT: Pupils equal, round, reactive to light. Conjunctivae clear.  Moist mucous membranes pharynx unremarkable Lungs: Clear and comfortable work of breathing  Cardiac: Regular rate and rhythm. Normal S1/S2.  No murmurs, rubs, nor gallops.   Extremities: 1+ pitting edema in both lower extremities localized from the toes all the way up into the mid shin region..  Strong peripheral pulses. Left elbow pain is reproduced with palpation of lateral epicondyle or resisted wrist extension no overlying skin changes noted in the left elbow. Right foot pain is reproduced with palpation of inferior calcaneus without any overlying skin changes. Mental Status: No depression, anxiety, nor agitation. Skin: Warm and dry. Scattered varicosities in both lower extremities  Assessment & Plan: Katrina Fernandez was seen  today for leg swelling.  Diagnoses and associated orders for this visit:  Bilateral Ankle swelling - Ambulatory referral to Vascular Surgery  Chronic venous insufficiency - Ambulatory referral to Vascular Surgery  Plantar fasciitis, right  Left tennis elbow    Bilateral ankle swelling: Due to persistence of pain and worsening of pain will refer to vascular surgery to discuss further management, encourage to continue for a semi-and wear compression stockings daily Plantar fasciitis: Discussed rehabilitation and stretching exercises she can do at home for the next 2-3 weeks on a daily basis. Left tennis elbow: Discussed rehabilitation and stretching exercises she can do at home for the next 2-3 weeks on a daily basis if not improved at that time consider coming to see Dr. Darene Lamer. for a sports medicine referral   Return if symptoms worsen or fail to improve.

## 2013-10-11 DIAGNOSIS — I781 Nevus, non-neoplastic: Secondary | ICD-10-CM | POA: Insufficient documentation

## 2013-10-29 ENCOUNTER — Other Ambulatory Visit: Payer: Self-pay | Admitting: Family Medicine

## 2013-12-04 ENCOUNTER — Telehealth: Payer: Self-pay

## 2013-12-04 NOTE — Telephone Encounter (Signed)
[place] called this am about leg cramps at night. I returned her call but got her voice mail so i LMOVM to return my call./Jaquelynn Wanamaker,CMA

## 2013-12-04 NOTE — Telephone Encounter (Signed)
Katrina Fernandez returned my call and she stated that she has been having leg cramps at night. I asked her how was her fluid intake and she stated not so good. So my advise to her was to up her fluid intake to where her urine is a light yellow to a clear color, also to take a MTV, and eat a banana a day, no ASA, and to stretch her calf muscles by straighten leg and flex foot up and down. I told the pt that if none of these relieves the cramps by the end of the week to please call the office and let get her in to be seen. Pt likes the ideas and stated that she will call if nothing changes./[name],CMA

## 2014-02-19 ENCOUNTER — Other Ambulatory Visit: Payer: Self-pay | Admitting: Family Medicine

## 2014-03-16 ENCOUNTER — Emergency Department
Admission: EM | Admit: 2014-03-16 | Discharge: 2014-03-16 | Disposition: A | Discharge status: Home / Self Care | Payer: Medicare Other | Source: Home / Self Care | Attending: Emergency Medicine | Admitting: Emergency Medicine

## 2014-03-16 ENCOUNTER — Encounter: Payer: Self-pay | Admitting: Emergency Medicine

## 2014-03-16 ENCOUNTER — Emergency Department (INDEPENDENT_AMBULATORY_CARE_PROVIDER_SITE_OTHER): Payer: Medicare Other

## 2014-03-16 DIAGNOSIS — M25561 Pain in right knee: Secondary | ICD-10-CM

## 2014-03-16 DIAGNOSIS — R600 Localized edema: Secondary | ICD-10-CM

## 2014-03-16 DIAGNOSIS — S93402A Sprain of unspecified ligament of left ankle, initial encounter: Secondary | ICD-10-CM

## 2014-03-16 DIAGNOSIS — S99919A Unspecified injury of unspecified ankle, initial encounter: Secondary | ICD-10-CM

## 2014-03-16 DIAGNOSIS — S8392XA Sprain of unspecified site of left knee, initial encounter: Secondary | ICD-10-CM

## 2014-03-16 DIAGNOSIS — M25562 Pain in left knee: Secondary | ICD-10-CM

## 2014-03-16 DIAGNOSIS — S8001XA Contusion of right knee, initial encounter: Secondary | ICD-10-CM

## 2014-03-16 DIAGNOSIS — M25572 Pain in left ankle and joints of left foot: Secondary | ICD-10-CM

## 2014-03-16 DIAGNOSIS — S8990XA Unspecified injury of unspecified lower leg, initial encounter: Secondary | ICD-10-CM

## 2014-03-16 NOTE — ED Provider Notes (Addendum)
CSN: [id number]     Arrival date & time 03/16/14  1529 History   First MD Initiated Contact with Patient 03/16/14 1559     Chief Complaint  Patient presents with  . Knee Injury    HPI 6 days ago, while traveling, accidentally tripped and fell on curb/sidewalk. Complains of persistent pain that's sharp and dull and left knee, right knee, and right ankle. She is able to weight-bear but it hurts to walk. The pain is moderate. Has not taken any new medication for this, but she is continued on her chronic medications, see list. She has not tried any other particularly modality such as ice or heat. Denies radiation. Denies numbness or tingling or focal weakness. Denies any new leg edema. Past Medical History  Diagnosis Date  . Hyperlipidemia   . Spinal stenosis   . GERD (gastroesophageal reflux disease) 01/28/2012  . Right ankle pain 01/28/2012  . Depression 01/28/2012  . DVT (deep venous thrombosis)   . Edema leg     bilat   Past Surgical History  Procedure Laterality Date  . Back surgery      7 back surgeries  . Neck surgery  2006  . Laparoscopic gastric banding    . Carpal tunnel release      both  . Total abdominal hysterectomy  1994  . Appendectomy    . Cholecystectomy    . Laparoscopic gastric banding    . Breast reduction surgery Bilateral 06/07/2013    Procedure: BILATERAL MAMMARY REDUCTION  (BREAST);  Surgeon: Theodoro Kos, DO;  Location: [facility];  Service: Plastics;  Laterality: Bilateral;  . Breast reduction surgery Bilateral 06/07/2013    Procedure:  LIPOSUCTION;  Surgeon: Theodoro Kos, DO;  Location: [facility];  Service: Plastics;  Laterality: Bilateral;   Family History  Problem Relation Age of Onset  . Depression    . Colon cancer Father   . Ovarian cancer Maternal Grandmother   . Pancreatic cancer Maternal Grandfather    History  Substance Use Topics  . Smoking status: Never Smoker   . Smokeless tobacco: Not on file  .  Alcohol Use: No   OB History   Grav Para Term Preterm Abortions TAB SAB Ect Mult Living                 Review of Systems  All other systems reviewed and are negative.   Allergies  Penicillins and Tape  Home Medications   Prior to Admission medications   Medication Sig Start Date End Date Taking? Authorizing Provider  citalopram (CELEXA) 40 MG tablet Take one tablet by mouth one time daily    Sean Hommel, DO  clonazePAM (KLONOPIN) 0.5 MG tablet TAKE ONE TABLET BY MOUTH DAILY AS NEEDED     Sean Hommel, DO  furosemide (LASIX) 20 MG tablet take 2 tablets by mouth every morning and 1 tab in the afternoon only as needed for leg swelling 02/20/14   Sean Hommel, DO  HYDROmorphone (DILAUDID) 2 MG tablet Take 2 mg by mouth every 6 (six) hours as needed for severe pain (breakthrough pain).    Historical Provider, MD  levETIRAcetam (KEPPRA) 500 MG tablet Take 500 mg by mouth 2 (two) times daily.    Historical Provider, MD  loratadine (CLARITIN) 10 MG tablet Take one tablet by mouth one time daily    Sean Hommel, DO  methadone (DOLOPHINE) 10 MG tablet Take 10 mg by mouth every 8 (eight) hours.    Historical  Provider, MD  pantoprazole (PROTONIX) 40 MG tablet Take 40 mg by mouth daily.    Historical Provider, MD  Polyethylene Glycol 3350 (MIRALAX PO) Take by mouth.    Historical Provider, MD  simvastatin (ZOCOR) 20 MG tablet Take one tablet by mouth in the evening    Sean Hommel, DO  tiZANidine (ZANAFLEX) 4 MG capsule Take 4 mg by mouth. 2-3 times daily    Historical Provider, MD  triamterene-hydrochlorothiazide (DYAZIDE) 37.5-25 MG per capsule Take 1 each (1 capsule total) by mouth every morning. 11/22/12   Sean Hommel, DO   BP 106/74  Pulse 88  Temp(Src) 97.9 F (36.6 C) (Oral)  Ht 5\' 2"  (1.575 m)  Wt 216 lb (97.977 kg)  BMI 39.50 kg/m2  SpO2 97% Physical Exam  Nursing note and vitals reviewed. Constitutional: She is oriented to person, place, and time. She appears well-developed and  well-nourished. No distress.  HENT:  Head: Normocephalic and atraumatic.  Eyes: Conjunctivae and EOM are normal. Pupils are equal, round, and reactive to light. No scleral icterus.  Neck: Normal range of motion.  Cardiovascular: Normal rate.   Pulmonary/Chest: Effort normal.  Abdominal: She exhibits no distension.  Musculoskeletal: Normal range of motion.  Left knee: Diffuse tenderness without deformity or bruising or skin change. No instability.+ Pain on flexion and extension. Soft, mobile Baker's cyst popliteal area without cords. Right knee:Diffuse tenderness without deformity. There is superficial abrasion anteriorly that's healing without sign of infection. No bruising No instability.+ Pain on flexion and extension. Soft, mobile Baker's cyst popliteal area without cords. Left ankle: Mildly swollen, moderately tender lateral malleolus. Pain on inversion and eversion. Range of motion within normal limits. No instability. No bruising or open wound. 1+ pretibial edema bilaterally, without definite cords. (She has chronic mild leg edema). Homans sign negative bilaterally. Neurovascular distally intact    Neurological: She is alert and oriented to person, place, and time.  Skin: Skin is warm.  Psychiatric: She has a normal mood and affect.    ED Course  Procedures (including critical care time) Labs Review Labs Reviewed - No data to display  Imaging Review Dg Ankle Complete Left  03/16/2014   CLINICAL DATA:  [place] approximately 5 days ago. Pain. Initial evaluation.  EXAM: LEFT ANKLE COMPLETE - 3+ VIEW  COMPARISON:  None.  FINDINGS: Diffuse soft tissue swelling.  No acute bony or joint abnormality.  IMPRESSION: Diffuse soft tissue swelling.  No acute bony or joint abnormality.   Electronically Signed   By: Marcello Moores  Register   On: 03/16/2014 16:41   Dg Knee Complete 4 Views Left  03/16/2014   CLINICAL DATA:  [place] over the weekend landing on knees, all over BILATERAL knee pain and swelling  RIGHT greater than LEFT, initial encounter  EXAM: LEFT KNEE - COMPLETE 4+ VIEW  COMPARISON:  None  FINDINGS: Bone mineralization normal.  Joint spaces preserved.  No fracture, dislocation, or bone destruction.  No joint effusion.  IMPRESSION: Normal exam.   Electronically Signed   By: Lavonia Dana M.D.   On: 03/16/2014 16:40   Dg Knee Complete 4 Views Right  03/16/2014   CLINICAL DATA:  [place] and landed on knees approximately 5 days ago. Initial evaluation.  EXAM: RIGHT KNEE - COMPLETE 4+ VIEW  COMPARISON:  None.  FINDINGS: There is no evidence of fracture, dislocation, or joint effusion. There is no evidence of arthropathy or other focal bone abnormality. Soft tissues are unremarkable.  IMPRESSION: Negative.   Electronically Signed   By:  Aubrey   On: 03/16/2014 16:39     MDM   1. Sprain of left knee, initial encounter   2. Knee injury   3. Ankle injury   4. Contusion of right knee, initial encounter   5. Left ankle sprain, initial encounter    Reviewed with her that x-ray showed no acute bony abnormalities. No fractures or dislocations. Treatment options discussed, as well as risks, benefits, alternatives. Patient voiced understanding and agreement with the following plans:  Bilateral last knee sleeves. Left ankle brace She declined any new prescription pain medication as she has those at home, see list. Follow-up with your primary care doctor or ortho in 5-7 days if not improving, or sooner if symptoms become worse. Precautions discussed. Red flags discussed. Questions invited and answered. Patient voiced understanding and agreement.    Jacqulyn Cane, MD 03/16/14 1806  Jacqulyn Cane, MD 03/16/14 [phone]

## 2014-03-16 NOTE — ED Notes (Signed)
Bi-lateral knee injury 1 week ago, patient feel on sidewalk landed on both knees, constant throbbing pain 9/10

## 2014-03-20 ENCOUNTER — Encounter: Payer: Self-pay | Admitting: Sports Medicine

## 2014-03-20 ENCOUNTER — Ambulatory Visit (INDEPENDENT_AMBULATORY_CARE_PROVIDER_SITE_OTHER): Payer: Medicare Other | Admitting: Sports Medicine

## 2014-03-20 VITALS — BP 95/63 | HR 87 | Ht 63.0 in | Wt 217.0 lb

## 2014-03-20 DIAGNOSIS — M7672 Peroneal tendinitis, left leg: Secondary | ICD-10-CM

## 2014-03-20 DIAGNOSIS — M179 Osteoarthritis of knee, unspecified: Secondary | ICD-10-CM

## 2014-03-20 DIAGNOSIS — F32A Depression, unspecified: Secondary | ICD-10-CM

## 2014-03-20 DIAGNOSIS — F329 Major depressive disorder, single episode, unspecified: Secondary | ICD-10-CM

## 2014-03-20 MED ORDER — IBUPROFEN 800 MG PO TABS: 800.0000 mg | ORAL_TABLET | Freq: Three times a day (TID) | ORAL | Status: DC | PRN

## 2014-03-20 NOTE — Progress Notes (Signed)
Subjective:    CC: bilateral knee pain  HPI: I injected Katrina Fernandez's knees in the distant past, she had a one-month response, we then referred her to orthopedic surgery due to lack of response. She did not get along with the orthopedic surgeon and presents back to me with recurrence of bilateral knee pain and left ankle pain after a recent fall. Pain is moderate, persistent and at the medial joint line. She was seen in urgent care and diagnosed with Baker's cysts.  Past medical history, Surgical history, Family history not pertinant except as noted below, Social history, Allergies, and medications have been entered into the medical record, reviewed, and no changes needed.   Review of Systems: No fevers, chills, night sweats, weight loss, chest pain, or shortness of breath.   Objective:    General: Well Developed, well nourished, and in no acute distress.  Neuro: Alert and oriented x3, extra-ocular muscles intact, sensation grossly intact.  HEENT: Normocephalic, atraumatic, pupils equal round reactive to light, neck supple, no masses, no lymphadenopathy, thyroid nonpalpable.  Skin: Warm and dry, no rashes. Cardiac: Regular rate and rhythm, no murmurs rubs or gallops, no lower extremity edema.  Respiratory: Clear to auscultation bilaterally. Not using accessory muscles, speaking in full sentences. Bilateral Knee: Normal to inspection with no erythema or effusion or obvious bony abnormalities. Tender to palpation at the medial joint lines bilaterally, there is no evidence of a Baker's cyst on either side. ROM normal in flexion and extension and lower leg rotation. Ligaments with solid consistent endpoints including ACL, PCL, LCL, MCL. Negative Mcmurray's and provocative meniscal tests. Non painful patellar compression. Patellar and quadriceps tendons unremarkable. Hamstring and quadriceps strength is normal. leftAnkle: No visible erythema or swelling. Range of motion is full in all  directions. Strength is 5/5 in all directions. Stable lateral and medial ligaments; squeeze test and kleiger test unremarkable; Talar dome nontender; No pain at base of 5th MT; No tenderness over cuboid; No tenderness over N spot or navicular prominence Tender to palpation behind the lateral malleolus with reproduction of pain with resisted ankle eversion. No sign of peroneal tendon subluxations; Negative tarsal tunnel tinel's Able to walk 4 steps.  The patient then had a panic attack, she was able to take some of her Xanax and was able to calm down.  Procedure: Real-time Ultrasound Guided Injection of left knee Device: GE Logiq E  Verbal informed consent obtained.  Time-out conducted.  Noted no overlying erythema, induration, or other signs of local infection.  Skin prepped in a sterile fashion.  Local anesthesia: Topical Ethyl chloride.  With sterile technique and under real time ultrasound guidance:  Scanned the gastrocnemius/semimembranosus fossa, there is no evidence of Baker's cyst, we then directed our attention to the suprapatellar recess, 2 mL kenalog 40, 4 mL lidocaine injected easily. Completed without difficulty  Pain immediately resolved suggesting accurate placement of the medication.  Advised to call if fevers/chills, erythema, induration, drainage, or persistent bleeding.  Images permanently stored and available for review in the ultrasound unit.  Impression: Technically successful ultrasound guided injection.  Procedure: Real-time Ultrasound Guided Injection of right knee Device: GE Logiq E  Verbal informed consent obtained.  Time-out conducted.  Noted no overlying erythema, induration, or other signs of local infection.  Skin prepped in a sterile fashion.  Local anesthesia: Topical Ethyl chloride.  With sterile technique and under real time ultrasound guidance:  Scanned the gastrocnemius/semimembranosus fossa, there is no evidence of Baker's cyst, we then directed  our attention  to the suprapatellar recess, 2 mL kenalog 40, 4 mL lidocaine injected easily. Completed without difficulty  Pain immediately resolved suggesting accurate placement of the medication.  Advised to call if fevers/chills, erythema, induration, drainage, or persistent bleeding.  Images permanently stored and available for review in the ultrasound unit.  Impression: Technically successful ultrasound guided injection.  Impression and Recommendations:    I spent 40 minutes with this patient, greater than 50% was face-to-face time counseling regarding the above diagnoses, and talking her down from her panic attack.

## 2014-03-20 NOTE — Assessment & Plan Note (Signed)
Patient had a panic attack in the office while discussing her knees. We allowed her to take one of her alprazolam, this stopped her panic attack, and we were able to proceed with treating her knees and ankle.

## 2014-03-20 NOTE — Assessment & Plan Note (Signed)
Formal physical therapy, ibuprofen. Injection if no better.

## 2014-03-20 NOTE — Assessment & Plan Note (Signed)
No visible Baker cyst on ultrasound in either knee. 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 Bilateral injections, we are going to get her set up for Visco supplementation.

## 2014-03-21 ENCOUNTER — Telehealth: Payer: Self-pay | Admitting: *Deleted

## 2014-03-21 DIAGNOSIS — M170 Bilateral primary osteoarthritis of knee: Secondary | ICD-10-CM

## 2014-03-21 NOTE — Telephone Encounter (Signed)
Awesome, thank you 

## 2014-03-21 NOTE — Telephone Encounter (Signed)
Orthovisc injections are scheduled and beginning next week. Authorization to buy and bill. Margette Fast, CMA

## 2014-03-27 ENCOUNTER — Ambulatory Visit (INDEPENDENT_AMBULATORY_CARE_PROVIDER_SITE_OTHER): Payer: Medicare Other | Admitting: Sports Medicine

## 2014-03-27 ENCOUNTER — Ambulatory Visit: Payer: Medicare Other | Admitting: Sports Medicine

## 2014-03-27 ENCOUNTER — Encounter: Payer: Self-pay | Admitting: Sports Medicine

## 2014-03-27 VITALS — BP 123/82 | HR 100 | Ht 63.0 in | Wt 212.0 lb

## 2014-03-27 DIAGNOSIS — M170 Bilateral primary osteoarthritis of knee: Secondary | ICD-10-CM | POA: Diagnosis not present

## 2014-03-27 DIAGNOSIS — M7672 Peroneal tendinitis, left leg: Secondary | ICD-10-CM

## 2014-03-27 NOTE — Assessment & Plan Note (Signed)
Peroneal tendon sheath injection as above.  There was a significant effusion in the tendon sheath itself. She will work with this and physical therapy, return as needed.

## 2014-03-27 NOTE — Assessment & Plan Note (Signed)
Orthovisc injection #1 into both knees. Return in one week for #2.

## 2014-03-27 NOTE — Progress Notes (Signed)
  Subjective:    CC: Orthovisc injections  HPI: Katrina Fernandez returns, she has bilateral knee osteoarthritis, she had a moderate response to steroid injection and returns for bilateral viscous supplementation.    Left peroneal tendinitis: Persistent pain despite conservative treatments, pain is moderate, persistent, localized behind the lateral malleolus. Desires interventional treatment today.  Past medical history, Surgical history, Family history not pertinant except as noted below, Social history, Allergies, and medications have been entered into the medical record, reviewed, and no changes needed.   Review of Systems: No fevers, chills, night sweats, weight loss, chest pain, or shortness of breath.   Objective:    General: Well Developed, well nourished, and in no acute distress.  Neuro: Alert and oriented x3, extra-ocular muscles intact, sensation grossly intact.  HEENT: Normocephalic, atraumatic, pupils equal round reactive to light, neck supple, no masses, no lymphadenopathy, thyroid nonpalpable.  Skin: Warm and dry, no rashes. Cardiac: Regular rate and rhythm, no murmurs rubs or gallops, no lower extremity edema.  Respiratory: Clear to auscultation bilaterally. Not using accessory muscles, speaking in full sentences.  Procedure: Real-time Ultrasound Guided Injection of left knee Device: GE Logiq E  Verbal informed consent obtained.  Time-out conducted.  Noted no overlying erythema, induration, or other signs of local infection.  Skin prepped in a sterile fashion.  Local anesthesia: Topical Ethyl chloride.  With sterile technique and under real time ultrasound guidance:   30 mg/2 mL of OrthoVisc (sodium hyaluronate) in a prefilled syringe was injected easily into the knee through a 22-gauge needle. Completed without difficulty  Pain immediately resolved suggesting accurate placement of the medication.  Advised to call if fevers/chills, erythema, induration, drainage, or persistent  bleeding.  Images permanently stored and available for review in the ultrasound unit.  Impression: Technically successful ultrasound guided injection.  Procedure: Real-time Ultrasound Guided Injection of right knee Device: GE Logiq E  Verbal informed consent obtained.  Time-out conducted.  Noted no overlying erythema, induration, or other signs of local infection.  Skin prepped in a sterile fashion.  Local anesthesia: Topical Ethyl chloride.  With sterile technique and under real time ultrasound guidance:   30 mg/2 mL of OrthoVisc (sodium hyaluronate) in a prefilled syringe was injected easily into the knee through a 22-gauge needle. Completed without difficulty  Pain immediately resolved suggesting accurate placement of the medication.  Advised to call if fevers/chills, erythema, induration, drainage, or persistent bleeding.  Images permanently stored and available for review in the ultrasound unit.  Impression: Technically successful ultrasound guided injection.  Procedure: Real-time Ultrasound Guided Injection of Left peroneal tendon sheath Device: GE Logiq E  Verbal informed consent obtained.  Time-out conducted.  Noted no overlying erythema, induration, or other signs of local infection.  Skin prepped in a sterile fashion.  Local anesthesia: Topical Ethyl chloride.  With sterile technique and under real time ultrasound guidance:  Noted copious tenosynovitis and effusion in the peroneal tendon sheath, needle advanced into the sheath and 1 mL kenalog 40, 2 mL lidocaine injected easily. Completed without difficulty  Pain immediately resolved suggesting accurate placement of the medication.  Advised to call if fevers/chills, erythema, induration, drainage, or persistent bleeding.  Images permanently stored and available for review in the ultrasound unit.  Impression: Technically successful ultrasound guided injection.  Impression and Recommendations:

## 2014-03-30 ENCOUNTER — Ambulatory Visit: Payer: Medicare Other | Admitting: Physical Therapy

## 2014-04-03 ENCOUNTER — Encounter: Payer: Self-pay | Admitting: Sports Medicine

## 2014-04-03 ENCOUNTER — Ambulatory Visit (INDEPENDENT_AMBULATORY_CARE_PROVIDER_SITE_OTHER): Payer: Medicare Other | Admitting: Sports Medicine

## 2014-04-03 ENCOUNTER — Encounter: Payer: Self-pay | Admitting: Family Medicine

## 2014-04-03 ENCOUNTER — Telehealth: Payer: Self-pay | Admitting: *Deleted

## 2014-04-03 ENCOUNTER — Ambulatory Visit (INDEPENDENT_AMBULATORY_CARE_PROVIDER_SITE_OTHER): Payer: Medicare Other | Admitting: Family Medicine

## 2014-04-03 VITALS — BP 105/74 | HR 94 | Ht 63.0 in | Wt 211.0 lb

## 2014-04-03 DIAGNOSIS — F411 Generalized anxiety disorder: Secondary | ICD-10-CM

## 2014-04-03 DIAGNOSIS — M7672 Peroneal tendinitis, left leg: Secondary | ICD-10-CM

## 2014-04-03 DIAGNOSIS — M170 Bilateral primary osteoarthritis of knee: Secondary | ICD-10-CM

## 2014-04-03 DIAGNOSIS — F32A Depression, unspecified: Secondary | ICD-10-CM

## 2014-04-03 DIAGNOSIS — F329 Major depressive disorder, single episode, unspecified: Secondary | ICD-10-CM

## 2014-04-03 DIAGNOSIS — M7989 Other specified soft tissue disorders: Secondary | ICD-10-CM

## 2014-04-03 LAB — D-DIMER, QUANTITATIVE: D-Dimer, Quant: 0.32 ug/mL-FEU (ref 0.00–0.48)

## 2014-04-03 MED ORDER — DULOXETINE HCL 30 MG PO CPEP: 90.0000 mg | ORAL_CAPSULE | Freq: Every day | ORAL | Status: DC

## 2014-04-03 NOTE — Assessment & Plan Note (Signed)
Completely resolved after injection at the last visit 

## 2014-04-03 NOTE — Progress Notes (Signed)

## 2014-04-03 NOTE — Patient Instructions (Signed)
Citalopram Taper: Half of a 40mg  tablet for four days (or until Cymbalta is available) Once Cymbalta is available begin by taking a single 30mg  capsule daily for four days, then two 30mg  capsules for four days, then begin full dose of three 30mg  capsules daily.  You can take all capsules at the same time every day.

## 2014-04-03 NOTE — Assessment & Plan Note (Signed)
Orthovisc injection #2 into both knees, right knee hurts, left knee is pain-free. Return in one week for #3 into both knees.

## 2014-04-03 NOTE — Progress Notes (Signed)
CC: Katrina Fernandez is a 48 y.o. female is here for f/u cymbalta   Subjective: HPI:  Complains of worsening anxiety over the past month. She still takes citalopram on a daily basis. She denies any depression or any other mental disturbance. Symptoms are moderate in severity further described as irritability and nervousness. She's had similar symptoms in the past that was not responsive to Effexor XR, Zoloft, Wellbutrin, Prozac all taken separately. Denies any thoughts of wanting to harm herself or others  Complains of left leg pain localized in the left calf. She tells me pain seems identical to when she had thrombophlebitis and superficial vein thrombosis in the past. She denies any swelling. Pain is worse with plantar flexion, nonradiating, described as a burning. Symptoms are mild in severity present on a daily basis for the last week. Has not been getting better or worse. There has been no shortness of breath nor rapid heartbeat or overlying skin changes   Review Of Systems Outlined In HPI  Past Medical History  Diagnosis Date  . Hyperlipidemia   . Spinal stenosis   . GERD (gastroesophageal reflux disease) 01/28/2012  . Right ankle pain 01/28/2012  . Depression 01/28/2012  . DVT (deep venous thrombosis)   . Edema leg     bilat    Past Surgical History  Procedure Laterality Date  . Back surgery      7 back surgeries  . Neck surgery  2006  . Laparoscopic gastric banding    . Carpal tunnel release      both  . Total abdominal hysterectomy  1994  . Appendectomy    . Cholecystectomy    . Laparoscopic gastric banding    . Breast reduction surgery Bilateral 06/07/2013    Procedure: BILATERAL MAMMARY REDUCTION  (BREAST);  Surgeon: Theodoro Kos, DO;  Location: [place];  Service: Plastics;  Laterality: Bilateral;  . Breast reduction surgery Bilateral 06/07/2013    Procedure:  LIPOSUCTION;  Surgeon: Theodoro Kos, DO;  Location: [place];  Service:  Plastics;  Laterality: Bilateral;   Family History  Problem Relation Age of Onset  . Depression    . Colon cancer Father   . Ovarian cancer Maternal Grandmother   . Pancreatic cancer Maternal Grandfather     History   Social History  . Marital Status: Married    Spouse Name: N/A    Number of Children: N/A  . Years of Education: N/A   Occupational History  . Not on file.   Social History Main Topics  . Smoking status: Never Smoker   . Smokeless tobacco: Not on file  . Alcohol Use: No  . Drug Use: No  . Sexual Activity: Yes   Other Topics Concern  . Not on file   Social History Narrative     Objective: BP 105/74 mmHg  Pulse 94  Ht 5\' 3"  (1.6 m)  Wt 211 lb (95.709 kg)  BMI 37.39 kg/m2  General: Alert and Oriented, No Acute Distress HEENT: Pupils equal, round, reactive to light. Conjunctivae clear.  Moist mucous membranes pharynx unremarkable Lungs: clear comfortable work of breathing Cardiac: Regular rate and rhythm Extremities:trace questionable left lower extremity edema localized to the ankle.  Strong peripheral pulses. There is no palpable cord in the left calf, pain is reproduced with passive dorsiflexion. No overlying skin changes at the site of discomfort. Pain is localized to the middle of the calf. Mental Status: No depression, anxiety, nor agitation. Skin: Warm and dry.  Assessment & Plan: [name] was seen today for f/u cymbalta.  Diagnoses and associated orders for this visit:  Depression - DULoxetine (CYMBALTA) 30 MG capsule; Take 3 capsules (90 mg total) by mouth daily.  Generalized anxiety disorder - DULoxetine (CYMBALTA) 30 MG capsule; Take 3 capsules (90 mg total) by mouth daily.  Left leg swelling - D-dimer, quantitative    Depression and anxiety: Depression is controlled but anxiety uncontrolled therefore restarting Cymbalta after citalopram taper. Instructions are in the patient instruction section. Left leg swelling: Suspicion for DVT  is low however this was the same case when a clot was found a little over a year ago therefore beginning workup with d-dimer and if positive she understands we'll have to get an ultrasound   Return if symptoms worsen or fail to improve.

## 2014-04-03 NOTE — Telephone Encounter (Signed)
Prior auth obtained for duloxetine good thru 05/18/15. Patient and pharmacy notified. Margette Fast, RMA

## 2014-04-04 ENCOUNTER — Telehealth: Payer: Self-pay

## 2014-04-04 DIAGNOSIS — F411 Generalized anxiety disorder: Secondary | ICD-10-CM

## 2014-04-04 NOTE — Telephone Encounter (Signed)
Valori states PA for the Cymbalta was approved. She has Medicare and even though the out of pocket is only 30 dollars the whole amount of the medication will go against her allowable amount per year. The total cost of the medication is 700 dollars a month. The total cost of Cymbalta will put her in the doughnut hole sooner. She would like a different medication.

## 2014-04-05 ENCOUNTER — Ambulatory Visit: Payer: Medicare Other | Admitting: Physical Therapy

## 2014-04-05 MED ORDER — PAROXETINE HCL 40 MG PO TABS: 40.0000 mg | ORAL_TABLET | ORAL | Status: DC

## 2014-04-05 NOTE — Telephone Encounter (Signed)
Katrina Fernandez, New recommendations would be to continue the plan of taking half a tab of her citalopram daily for at least four days then switch to new Rx of paroxetine which is generic Paxil.  The paroxetine should be started at half of the 40mg  tablet daily for four days then a full 40mg  tablet daily thereafter.  F/U with me one month.

## 2014-04-05 NOTE — Telephone Encounter (Signed)
Pt.notified

## 2014-04-10 ENCOUNTER — Ambulatory Visit: Payer: Medicare Other | Admitting: Sports Medicine

## 2014-04-17 ENCOUNTER — Encounter: Payer: Self-pay | Admitting: Sports Medicine

## 2014-04-17 ENCOUNTER — Ambulatory Visit (INDEPENDENT_AMBULATORY_CARE_PROVIDER_SITE_OTHER): Payer: Medicare Other | Admitting: Sports Medicine

## 2014-04-17 VITALS — BP 96/64 | HR 89 | Wt 213.0 lb

## 2014-04-17 DIAGNOSIS — M170 Bilateral primary osteoarthritis of knee: Secondary | ICD-10-CM

## 2014-04-17 DIAGNOSIS — M7712 Lateral epicondylitis, left elbow: Secondary | ICD-10-CM | POA: Insufficient documentation

## 2014-04-17 DIAGNOSIS — M7710 Lateral epicondylitis, unspecified elbow: Secondary | ICD-10-CM | POA: Insufficient documentation

## 2014-04-17 DIAGNOSIS — M7711 Lateral epicondylitis, right elbow: Secondary | ICD-10-CM | POA: Insufficient documentation

## 2014-04-17 NOTE — Progress Notes (Signed)
  Subjective:    CC: Follow-up  HPI: Patient is here for Orthovisc, she also endorses left elbow pain. Left elbow pain is moderate, persistent, localized over the lateral epicondyle without radiation, worse with palpation and using the elbow. No trauma. Present for several weeks.  Bilateral knee osteoarthritis: Returns for Orthovisc injection #3 into both knees.  Past medical history, Surgical history, Family history not pertinant except as noted below, Social history, Allergies, and medications have been entered into the medical record, reviewed, and no changes needed.   Review of Systems: No fevers, chills, night sweats, weight loss, chest pain, or shortness of breath.   Objective:    General: Well Developed, well nourished, and in no acute distress.  Neuro: Alert and oriented x3, extra-ocular muscles intact, sensation grossly intact.  HEENT: Normocephalic, atraumatic, pupils equal round reactive to light, neck supple, no masses, no lymphadenopathy, thyroid nonpalpable.  Skin: Warm and dry, no rashes. Cardiac: Regular rate and rhythm, no murmurs rubs or gallops, no lower extremity edema.  Respiratory: Clear to auscultation bilaterally. Not using accessory muscles, speaking in full sentences. Left Elbow: Unremarkable to inspection. Range of motion full pronation, supination, flexion, extension. Strength is full to all of the above directions Stable to varus, valgus stress. Negative moving valgus stress test. Tender to palpation at the common extensor tendon origin. Ulnar nerve does not sublux. Negative cubital tunnel Tinel's.  Procedure: Real-time Ultrasound Guided Injection of left knee Device: GE Logiq E  Verbal informed consent obtained.  Time-out conducted.  Noted no overlying erythema, induration, or other signs of local infection.  Skin prepped in a sterile fashion.  Local anesthesia: Topical Ethyl chloride.  With sterile technique and under real time ultrasound guidance:    30 mg/2 mL of OrthoVisc (sodium hyaluronate) in a prefilled syringe was injected easily into the knee through a 22-gauge needle. Completed without difficulty  Pain immediately resolved suggesting accurate placement of the medication.  Advised to call if fevers/chills, erythema, induration, drainage, or persistent bleeding.  Images permanently stored and available for review in the ultrasound unit.  Impression: Technically successful ultrasound guided injection.  Procedure: Real-time Ultrasound Guided Injection of right knee Device: GE Logiq E  Verbal informed consent obtained.  Time-out conducted.  Noted no overlying erythema, induration, or other signs of local infection.  Skin prepped in a sterile fashion.  Local anesthesia: Topical Ethyl chloride.  With sterile technique and under real time ultrasound guidance:   30 mg/2 mL of OrthoVisc (sodium hyaluronate) in a prefilled syringe was injected easily into the knee through a 22-gauge needle. Completed without difficulty  Pain immediately resolved suggesting accurate placement of the medication.  Advised to call if fevers/chills, erythema, induration, drainage, or persistent bleeding.  Images permanently stored and available for review in the ultrasound unit.  Impression: Technically successful ultrasound guided injection.  Impression and Recommendations:

## 2014-04-17 NOTE — Assessment & Plan Note (Signed)
Orthovisc injection #3 into both knees. Right knee continues to be just is painful, left knee is pain-free. Return in one week for #4.

## 2014-04-17 NOTE — Assessment & Plan Note (Signed)
Rehabilitation exercises given. In one month for this, injection if no better.

## 2014-04-24 ENCOUNTER — Encounter: Payer: Self-pay | Admitting: Sports Medicine

## 2014-04-24 ENCOUNTER — Ambulatory Visit (INDEPENDENT_AMBULATORY_CARE_PROVIDER_SITE_OTHER): Payer: Medicare Other | Admitting: Sports Medicine

## 2014-04-24 VITALS — BP 112/77 | HR 85 | Wt 221.0 lb

## 2014-04-24 DIAGNOSIS — M170 Bilateral primary osteoarthritis of knee: Secondary | ICD-10-CM

## 2014-04-24 DIAGNOSIS — M7712 Lateral epicondylitis, left elbow: Secondary | ICD-10-CM

## 2014-04-24 MED ORDER — HYDROCODONE-ACETAMINOPHEN 10-325 MG PO TABS: 1.0000 | ORAL_TABLET | Freq: Three times a day (TID) | ORAL | Status: DC | PRN

## 2014-04-24 NOTE — Assessment & Plan Note (Signed)
Orthovisc injection #4 of 4 into both knees, right knee is still very painful. Ordering an MRI of the right knee. Return to go over MRI results.

## 2014-04-24 NOTE — Progress Notes (Signed)
Subjective:    CC: Follow-up  HPI: Bilateral knee osteoarthritis: Left knee is pain-free, right knee still hurts. She is here for Orthovisc injection #4 of 4 into both knees.  Left elbow pain: Diagnosed with tennis elbow with the last visit, I placed her through home rehabilitation exercises, she returns today with swelling and worsening of pain. Localized at the common extensor tendon origin.  Past medical history, Surgical history, Family history not pertinant except as noted below, Social history, Allergies, and medications have been entered into the medical record, reviewed, and no changes needed.   Review of Systems: No fevers, chills, night sweats, weight loss, chest pain, or shortness of breath.   Objective:    General: Well Developed, well nourished, and in no acute distress.  Neuro: Alert and oriented x3, extra-ocular muscles intact, sensation grossly intact.  HEENT: Normocephalic, atraumatic, pupils equal round reactive to light, neck supple, no masses, no lymphadenopathy, thyroid nonpalpable.  Skin: Warm and dry, no rashes. Cardiac: Regular rate and rhythm, no murmurs rubs or gallops, no lower extremity edema.  Respiratory: Clear to auscultation bilaterally. Not using accessory muscles, speaking in full sentences. Left Elbow: Visibly swollen with tenderness to palpation of the common extensor tendon origin Range of motion full pronation, supination, flexion, extension. Strength is full to all of the above directions Stable to varus, valgus stress. Negative moving valgus stress test. Tender to palpation at the common extensor tendon origin. Ulnar nerve does not sublux. Negative cubital tunnel Tinel's.  Procedure: Diagnostic Ultrasound of  left elbow Device: GE Logiq E  Findings: Noted an area of hypoechoic change in the common extensor tendon near its origin of the lateral epicondyles Images permanently stored and available for review in the ultrasound unit.  Impression:  Focal tear, common extensor tendon/lateral epicondylitis.   Procedure: Real-time Ultrasound Guided Injection/percutaneous needle tenotomy of left common extensor tendon origin Device: GE Logiq E  Verbal informed consent obtained.  Time-out conducted.  Noted no overlying erythema, induration, or other signs of local infection.  Skin prepped in a sterile fashion.  Local anesthesia: Topical Ethyl chloride.  With sterile technique and under real time ultrasound guidance:  Using a 25-gauge needle I performed a percutaneous needle tenotomy, I also injected a total of 1 mL kenalog 40, 4 mL lidocaine, during the percutaneous needle tenotomy and placed several fenestrations in the periosteum of the lateral epicondyle. Completed without difficulty  Pain immediately resolved suggesting accurate placement of the medication.  Advised to call if fevers/chills, erythema, induration, drainage, or persistent bleeding.  Images permanently stored and available for review in the ultrasound unit.  Impression: Technically successful ultrasound guided injection.  The left elbow was then strapped with compressive dressing.  Procedure: Real-time Ultrasound Guided Injection of left knee Device: GE Logiq E  Verbal informed consent obtained.  Time-out conducted.  Noted no overlying erythema, induration, or other signs of local infection.  Skin prepped in a sterile fashion.  Local anesthesia: Topical Ethyl chloride.  With sterile technique and under real time ultrasound guidance:   30 mg/2 mL of OrthoVisc (sodium hyaluronate) in a prefilled syringe was injected easily into the knee through a 22-gauge needle. Completed without difficulty  Pain immediately resolved suggesting accurate placement of the medication.  Advised to call if fevers/chills, erythema, induration, drainage, or persistent bleeding.  Images permanently stored and available for review in the ultrasound unit.  Impression: Technically successful  ultrasound guided injection.  Procedure: Real-time Ultrasound Guided Injection of right knee Device: GE Logiq E  Verbal informed consent obtained.  Time-out conducted.  Noted no overlying erythema, induration, or other signs of local infection.  Skin prepped in a sterile fashion.  Local anesthesia: Topical Ethyl chloride.  With sterile technique and under real time ultrasound guidance:   30 mg/2 mL of OrthoVisc (sodium hyaluronate) in a prefilled syringe was injected easily into the knee through a 22-gauge needle. Completed without difficulty  Pain immediately resolved suggesting accurate placement of the medication.  Advised to call if fevers/chills, erythema, induration, drainage, or persistent bleeding.  Images permanently stored and available for review in the ultrasound unit.  Impression: Technically successful ultrasound guided injection.  Impression and Recommendations:

## 2014-04-24 NOTE — Assessment & Plan Note (Signed)
Diagnostic ultrasound did show a hypoechoic gap in the proximal origin of the common extensor tendon. Percutaneous needle tenotomy injection. Strap with compressive dressing. Return in one month for this, consider PRP if no better.

## 2014-04-25 ENCOUNTER — Telehealth: Payer: Self-pay

## 2014-04-25 NOTE — Telephone Encounter (Signed)
Katrina Fernandez is reports swelling down left arm and hand. She did have Percutaneous needle tenotomy injection yesterday. I advised patient that it wasn't uncommon to have some swelling. Advised to watch out for fever, chills, increased redness around the site and sweats. I will send to Dr Dianah Field for his recommendation.

## 2014-04-25 NOTE — Telephone Encounter (Signed)
Patient advised of recommendations.  

## 2014-04-25 NOTE — Telephone Encounter (Signed)
The swelling is not uncommon, I agree. She can discontinue the compressive wrap, elevate the hand and ice any painful areas. Do her best to avoid use of the hand, it may even be best to place it in a sling which she can obtain over-the-counter.

## 2014-04-26 ENCOUNTER — Telehealth: Payer: Self-pay | Admitting: *Deleted

## 2014-04-26 NOTE — Telephone Encounter (Signed)
MRI approval [id number] expires 06/10/14. Margette Fast, CMA

## 2014-04-30 ENCOUNTER — Ambulatory Visit (INDEPENDENT_AMBULATORY_CARE_PROVIDER_SITE_OTHER): Payer: Medicare Other

## 2014-04-30 DIAGNOSIS — M7121 Synovial cyst of popliteal space [Baker], right knee: Secondary | ICD-10-CM

## 2014-04-30 DIAGNOSIS — M170 Bilateral primary osteoarthritis of knee: Secondary | ICD-10-CM

## 2014-05-03 ENCOUNTER — Telehealth: Payer: Self-pay | Admitting: *Deleted

## 2014-05-03 NOTE — Telephone Encounter (Signed)
She was calling today that she had her MRI on Monday and was calling for results. Margette Fast, RMA

## 2014-05-03 NOTE — Telephone Encounter (Signed)
MRI shows no internal derangement of the right knee. There is a small complex Baker's cyst.

## 2014-05-04 ENCOUNTER — Telehealth: Payer: Self-pay

## 2014-05-04 NOTE — Telephone Encounter (Signed)
There is a previous phone note where the results were discussed with her.

## 2014-05-04 NOTE — Telephone Encounter (Signed)
Katrina Fernandez advised of results.

## 2014-05-04 NOTE — Telephone Encounter (Signed)
Katrina Fernandez called about her MRI results. I advised her to schedule a follow up to review results. She states she will call back.

## 2014-05-14 ENCOUNTER — Other Ambulatory Visit: Payer: Self-pay | Admitting: Family Medicine

## 2014-05-16 ENCOUNTER — Telehealth: Payer: Self-pay | Admitting: Family Medicine

## 2014-05-16 DIAGNOSIS — F411 Generalized anxiety disorder: Secondary | ICD-10-CM

## 2014-05-16 MED ORDER — PAROXETINE HCL 40 MG PO TABS: 40.0000 mg | ORAL_TABLET | ORAL | Status: DC

## 2014-05-16 MED ORDER — TRIAMCINOLONE ACETONIDE 0.1 % MT PSTE: 1.0000 "application " | PASTE | Freq: Two times a day (BID) | OROMUCOSAL | Status: DC

## 2014-05-16 MED ORDER — PAROXETINE HCL 10 MG PO TABS: 10.0000 mg | ORAL_TABLET | Freq: Every day | ORAL | Status: DC

## 2014-05-16 NOTE — Telephone Encounter (Signed)
While patient was accompanied her mother today she wanted to know if she could have an increase in her Paxil due to increased irritability at home and out in the community. She denies thoughts of wanting to harm herself or others. Adding 10 mg of Paxil to 40 mg formulation  She is also having cracking in the corners of her mouth despite using lip balm frequently throughout the day. It is painful and she opens her mouth fully they will crack and occasionally bleed.  Start triamcinolone to the corners of the mouth as needed for pain swelling and cracking.

## 2014-05-23 ENCOUNTER — Encounter: Payer: Self-pay | Admitting: Family Medicine

## 2014-05-24 ENCOUNTER — Encounter: Payer: Self-pay | Admitting: Family Medicine

## 2014-05-25 MED ORDER — ZOLPIDEM TARTRATE 5 MG PO TABS: 5.0000 mg | ORAL_TABLET | Freq: Every evening | ORAL | Status: DC | PRN

## 2014-06-19 ENCOUNTER — Encounter: Payer: Self-pay | Admitting: Sports Medicine

## 2014-06-19 ENCOUNTER — Ambulatory Visit (INDEPENDENT_AMBULATORY_CARE_PROVIDER_SITE_OTHER): Payer: Medicare Other | Admitting: Sports Medicine

## 2014-06-19 VITALS — BP 116/80 | HR 94 | Ht 63.0 in | Wt 220.0 lb

## 2014-06-19 DIAGNOSIS — M7712 Lateral epicondylitis, left elbow: Secondary | ICD-10-CM | POA: Diagnosis not present

## 2014-06-19 DIAGNOSIS — M7711 Lateral epicondylitis, right elbow: Secondary | ICD-10-CM | POA: Diagnosis not present

## 2014-06-19 NOTE — Assessment & Plan Note (Signed)
I performed a percutaneous needle tenotomy a month ago on the left side and she returns with symptoms 80-90% improved. She is having pain now on the right side and desires interventional treatment. Today we did not perform a percutaneous tenotomy, I did an injection both superficial to and deep to the common extensor tendon at its origin at the lateral epicondyles. She is going to be aggressive with home rehabilitation exercises, if persistent pain in either side of the return visit we will consider PRP.

## 2014-06-19 NOTE — Progress Notes (Signed)
  Subjective:    CC: Right elbow pain  HPI: Lateral epicondylitis: I performed a percutaneous needle tenotomy on the left elbow a month ago and pain is 90% improved, she did not do any rehabilitation, she now has similar pain on the right side, moderate, persistent without radiation, localized over the lateral epicondyle, she has been lifting the baby more often than usual.  Past medical history, Surgical history, Family history not pertinant except as noted below, Social history, Allergies, and medications have been entered into the medical record, reviewed, and no changes needed.   Review of Systems: No fevers, chills, night sweats, weight loss, chest pain, or shortness of breath.   Objective:    General: Well Developed, well nourished, and in no acute distress.  Neuro: Alert and oriented x3, extra-ocular muscles intact, sensation grossly intact.  HEENT: Normocephalic, atraumatic, pupils equal round reactive to light, neck supple, no masses, no lymphadenopathy, thyroid nonpalpable.  Skin: Warm and dry, no rashes. Cardiac: Regular rate and rhythm, no murmurs rubs or gallops, no lower extremity edema.  Respiratory: Clear to auscultation bilaterally. Not using accessory muscles, speaking in full sentences. Right Elbow: Unremarkable to inspection. Range of motion full pronation, supination, flexion, extension. Strength is full to all of the above directions Stable to varus, valgus stress. Negative moving valgus stress test. Tender to palpation of the common extensor tendon origin Ulnar nerve does not sublux. Negative cubital tunnel Tinel's.  Procedure: Real-time Ultrasound Guided Injection of right common extensor tendon origin Device: GE Logiq E  Verbal informed consent obtained.  Time-out conducted.  Noted no overlying erythema, induration, or other signs of local infection.  Skin prepped in a sterile fashion.  Local anesthesia: Topical Ethyl chloride.  With sterile technique and  under real time ultrasound guidance:  I injected a total of 1 mL kenalog 40, 4 mL lidocaine both superficial to and deep to the common extensor tendon at its origin, we did not perform a needle tenotomy. Completed without difficulty  Pain immediately resolved suggesting accurate placement of the medication.  Advised to call if fevers/chills, erythema, induration, drainage, or persistent bleeding.  Images permanently stored and available for review in the ultrasound unit.  Impression: Technically successful ultrasound guided injection.  Impression and Recommendations:

## 2014-06-20 DIAGNOSIS — G894 Chronic pain syndrome: Secondary | ICD-10-CM | POA: Diagnosis not present

## 2014-06-20 DIAGNOSIS — M961 Postlaminectomy syndrome, not elsewhere classified: Secondary | ICD-10-CM | POA: Diagnosis not present

## 2014-06-20 DIAGNOSIS — Z79899 Other long term (current) drug therapy: Secondary | ICD-10-CM | POA: Diagnosis not present

## 2014-07-05 DIAGNOSIS — Z79899 Other long term (current) drug therapy: Secondary | ICD-10-CM | POA: Diagnosis not present

## 2014-07-05 DIAGNOSIS — M542 Cervicalgia: Secondary | ICD-10-CM | POA: Diagnosis not present

## 2014-07-05 DIAGNOSIS — Z9049 Acquired absence of other specified parts of digestive tract: Secondary | ICD-10-CM | POA: Diagnosis not present

## 2014-07-05 DIAGNOSIS — Z91048 Other nonmedicinal substance allergy status: Secondary | ICD-10-CM | POA: Diagnosis not present

## 2014-07-05 DIAGNOSIS — G894 Chronic pain syndrome: Secondary | ICD-10-CM | POA: Diagnosis not present

## 2014-07-05 DIAGNOSIS — M5417 Radiculopathy, lumbosacral region: Secondary | ICD-10-CM | POA: Diagnosis not present

## 2014-07-05 DIAGNOSIS — M545 Low back pain: Secondary | ICD-10-CM | POA: Diagnosis not present

## 2014-07-05 DIAGNOSIS — Z880 Allergy status to penicillin: Secondary | ICD-10-CM | POA: Diagnosis not present

## 2014-07-09 ENCOUNTER — Ambulatory Visit (INDEPENDENT_AMBULATORY_CARE_PROVIDER_SITE_OTHER): Payer: Medicare Other | Admitting: Family Medicine

## 2014-07-09 ENCOUNTER — Encounter: Payer: Self-pay | Admitting: Family Medicine

## 2014-07-09 VITALS — BP 122/78 | HR 92 | Wt 219.0 lb

## 2014-07-09 DIAGNOSIS — R251 Tremor, unspecified: Secondary | ICD-10-CM

## 2014-07-09 DIAGNOSIS — F32A Depression, unspecified: Secondary | ICD-10-CM

## 2014-07-09 DIAGNOSIS — F329 Major depressive disorder, single episode, unspecified: Secondary | ICD-10-CM | POA: Diagnosis not present

## 2014-07-09 DIAGNOSIS — G4700 Insomnia, unspecified: Secondary | ICD-10-CM | POA: Insufficient documentation

## 2014-07-09 LAB — COMPLETE METABOLIC PANEL WITH GFR
ALT: 12 U/L (ref 0–35)
AST: 12 U/L (ref 0–37)
Albumin: 4 g/dL (ref 3.5–5.2)
Alkaline Phosphatase: 131 U/L — ABNORMAL HIGH (ref 39–117)
BUN: 21 mg/dL (ref 6–23)
CO2: 29 mEq/L (ref 19–32)
Calcium: 9.4 mg/dL (ref 8.4–10.5)
Chloride: 101 mEq/L (ref 96–112)
Creat: 0.8 mg/dL (ref 0.50–1.10)
GFR, Est African American: >89 mL/min
GFR, Est Non African American: 89 mL/min
Glucose, Bld: 76 mg/dL (ref 70–99)
Potassium: 4.3 mEq/L (ref 3.5–5.3)
Sodium: 143 mEq/L (ref 135–145)
Total Bilirubin: 0.3 mg/dL (ref 0.2–1.2)
Total Protein: 6.7 g/dL (ref 6.0–8.3)

## 2014-07-09 LAB — TSH: TSH: 1.802 u[IU]/mL (ref 0.350–4.500)

## 2014-07-09 MED ORDER — DULOXETINE HCL 60 MG PO CPEP: 120.0000 mg | ORAL_CAPSULE | Freq: Every day | ORAL | Status: DC

## 2014-07-09 MED ORDER — PAROXETINE HCL 10 MG PO TABS: 10.0000 mg | ORAL_TABLET | Freq: Every day | ORAL | Status: DC

## 2014-07-09 NOTE — Progress Notes (Signed)
CC: Katrina Fernandez is a 49 y.o. female is here for f/u depression   Subjective: HPI:  She feels that depression has been worsening since I saw her last. She increased her Paxil to 50 mg on a daily basis however still has difficulty with subjective depression, extreme irritability, low patience. She's also experienced decreased pleasure in previously enjoyed hobbies. She's tried a multitude of anti-anxiety and depression medications in the past. No benefit from Prozac, Paxil, Zoloft, Lexapro, Amitriptyline.she believes that Cymbalta was the only medication that was fully effective. She's been on this in the past without any side effects but had to stop taking it due to price. Denies thoughts 1 on herself or others.  Follow-up insomnia: She's been trying 5 mg of Ambien on nights that she knows she's and have difficulty falling asleep. It helps greatly with staying asleep and getting to sleep however causes fatigue the next day. It sounds that she is only taking his medication a few times a week. Denies any known side effects  Complains of a tremor that's present only in her hands bilaterally. It fluctuates from mild to moderate severity. It's worse if she looks at her hands and thinks about it. She denies any tremor at rest rigidity nor falls. She denies any other motor or sensory disturbances other than occasional subjective warmth about her entire body.symptoms of been present for a few weeks now not getting better or worse for the most part since onset.   Review Of Systems Outlined In HPI  Past Medical History  Diagnosis Date  . Hyperlipidemia   . Spinal stenosis   . GERD (gastroesophageal reflux disease) 01/28/2012  . Right ankle pain 01/28/2012  . Depression 01/28/2012  . DVT (deep venous thrombosis)   . Edema leg     bilat    Past Surgical History  Procedure Laterality Date  . Back surgery      7 back surgeries  . Neck surgery  2006  . Laparoscopic gastric banding    . Carpal  tunnel release      both  . Total abdominal hysterectomy  1994  . Appendectomy    . Cholecystectomy    . Laparoscopic gastric banding    . Breast reduction surgery Bilateral 06/07/2013    Procedure: BILATERAL MAMMARY REDUCTION  (BREAST);  Surgeon: Theodoro Kos, DO;  Location: [facility];  Service: Plastics;  Laterality: Bilateral;  . Breast reduction surgery Bilateral 06/07/2013    Procedure:  LIPOSUCTION;  Surgeon: Theodoro Kos, DO;  Location: [facility];  Service: Plastics;  Laterality: Bilateral;   Family History  Problem Relation Age of Onset  . Depression    . Colon cancer Father   . Ovarian cancer Maternal Grandmother   . Pancreatic cancer Maternal Grandfather     History   Social History  . Marital Status: Married    Spouse Name: N/A  . Number of Children: N/A  . Years of Education: N/A   Occupational History  . Not on file.   Social History Main Topics  . Smoking status: Never Smoker   . Smokeless tobacco: Not on file  . Alcohol Use: No  . Drug Use: No  . Sexual Activity: Yes   Other Topics Concern  . Not on file   Social History Narrative     Objective: BP 122/78 mmHg  Pulse 92  Wt 219 lb (99.338 kg)  General: Alert and Oriented, No Acute Distress HEENT: Pupils equal, round, reactive to light.  Conjunctivae clear.  Most mucous membranes Lungs: clear comfortable work of breathing Cardiac: Regular rate and rhythm.  Extremities: No peripheral edema.  Strong peripheral pulses. No resting tremor but a mild high frequency low amplitude tremor in the fingers and thumbs are noted when hands are held out in front of her however not with any other purposeful movement. Finger to nose testing without any difficulty. Mental Status: mild depression and anxiety. No agitation Skin: Warm and dry.  Assessment & Plan: Katrina Fernandez was seen today for f/u depression.  Diagnoses and all orders for this visit:  Depression Orders: -      DULoxetine (CYMBALTA) 60 MG capsule; Take 2 capsules (120 mg total) by mouth daily. -     PARoxetine (PAXIL) 10 MG tablet; Take 1 tablet (10 mg total) by mouth daily.  Insomnia  Tremor Orders: -     TSH -     COMPLETE METABOLIC PANEL WITH GFR  Depression Orders: -     DULoxetine (CYMBALTA) 60 MG capsule; Take 2 capsules (120 mg total) by mouth daily. -     PARoxetine (PAXIL) 10 MG tablet; Take 1 tablet (10 mg total) by mouth daily.   Depression: Uncontrolled tapering off of Paxil switching to Cymbalta if insurance will approve it. If not improved my next plan would be to switch to Effexor. Insomnia: Discussed with her that there are options for her insomnia that might not leave her with side effects in the morning if the fatigue and drowsiness is ever bad enough to where she wants to take a different medication Tremor: Rule out metabolic abnormality, electrolyte abnormality or thyroid abnormality. She has a family history of diabetes  Return in about 2 months (around 09/07/2014) for Mood Follow Up.

## 2014-07-09 NOTE — Patient Instructions (Addendum)
Begin tapering down on your Paxil by taking 40mg  daily for five days, then spit this in half to take 20mg  daily for five days, then begin the 10mg  formulation that I've sent to your [facility] for the next five days.  On the day after you have completed this begin taking Cymbalta (if covered by insurance) or Effexor (if Cymbalta not covered by insurance I will call this into [facility])

## 2014-07-10 ENCOUNTER — Other Ambulatory Visit: Payer: Self-pay

## 2014-07-11 ENCOUNTER — Other Ambulatory Visit: Payer: Self-pay

## 2014-07-11 DIAGNOSIS — E785 Hyperlipidemia, unspecified: Secondary | ICD-10-CM

## 2014-07-11 MED ORDER — SIMVASTATIN 20 MG PO TABS: 20.0000 mg | ORAL_TABLET | Freq: Every evening | ORAL | Status: DC

## 2014-07-17 ENCOUNTER — Ambulatory Visit: Payer: Medicare Other | Admitting: Sports Medicine

## 2014-07-23 ENCOUNTER — Other Ambulatory Visit: Payer: Self-pay | Admitting: *Deleted

## 2014-07-23 MED ORDER — LORATADINE 10 MG PO TABS: 10.0000 mg | ORAL_TABLET | Freq: Every day | ORAL | Status: DC

## 2014-07-30 ENCOUNTER — Telehealth: Payer: Self-pay

## 2014-07-30 MED ORDER — VENLAFAXINE HCL ER 75 MG PO CP24: ORAL_CAPSULE | ORAL | Status: DC

## 2014-07-30 NOTE — Telephone Encounter (Signed)
Pt.notified

## 2014-07-30 NOTE — Telephone Encounter (Signed)
Katrina Fernandez, Next option is generic Effexor XR.  I'd recommend taking only 60mg  of cymbalta daily for five days then switch to the Effexor taper I sent to [facility].

## 2014-07-30 NOTE — Telephone Encounter (Signed)
Kanyon states the Cymbalta is causing sweating. She would like to switch medication. Please advise.

## 2014-08-09 DIAGNOSIS — M961 Postlaminectomy syndrome, not elsewhere classified: Secondary | ICD-10-CM | POA: Diagnosis not present

## 2014-08-16 DIAGNOSIS — Z5181 Encounter for therapeutic drug level monitoring: Secondary | ICD-10-CM | POA: Diagnosis not present

## 2014-08-16 DIAGNOSIS — M7712 Lateral epicondylitis, left elbow: Secondary | ICD-10-CM | POA: Diagnosis not present

## 2014-08-16 DIAGNOSIS — Z79899 Other long term (current) drug therapy: Secondary | ICD-10-CM | POA: Diagnosis not present

## 2014-08-16 DIAGNOSIS — M961 Postlaminectomy syndrome, not elsewhere classified: Secondary | ICD-10-CM | POA: Diagnosis not present

## 2014-08-16 DIAGNOSIS — G894 Chronic pain syndrome: Secondary | ICD-10-CM | POA: Diagnosis not present

## 2014-08-23 DIAGNOSIS — M961 Postlaminectomy syndrome, not elsewhere classified: Secondary | ICD-10-CM | POA: Diagnosis not present

## 2014-08-23 DIAGNOSIS — M542 Cervicalgia: Secondary | ICD-10-CM | POA: Diagnosis not present

## 2014-09-11 ENCOUNTER — Other Ambulatory Visit: Payer: Self-pay | Admitting: Family Medicine

## 2014-09-17 DIAGNOSIS — G894 Chronic pain syndrome: Secondary | ICD-10-CM | POA: Diagnosis not present

## 2014-09-17 DIAGNOSIS — M961 Postlaminectomy syndrome, not elsewhere classified: Secondary | ICD-10-CM | POA: Diagnosis not present

## 2014-09-19 ENCOUNTER — Ambulatory Visit (INDEPENDENT_AMBULATORY_CARE_PROVIDER_SITE_OTHER): Payer: Medicare Other | Admitting: Sports Medicine

## 2014-09-19 ENCOUNTER — Encounter: Payer: Self-pay | Admitting: Sports Medicine

## 2014-09-19 DIAGNOSIS — M7712 Lateral epicondylitis, left elbow: Secondary | ICD-10-CM

## 2014-09-19 DIAGNOSIS — M7711 Lateral epicondylitis, right elbow: Secondary | ICD-10-CM

## 2014-09-19 NOTE — Assessment & Plan Note (Signed)
Right side is doing well after injection. Left side is persistently painful. Only 2 months response. Referral for surgery.

## 2014-09-19 NOTE — Progress Notes (Signed)
  Subjective:    CC: Follow-up  HPI: Bilateral lateral epicondylitis: Good response to the right side with an injection at the last month with good pain relief, left side was injected 2 months ago with persistent pain. At this point she's failed physical therapy regarding her elbows, or medications, bracing, and ultrasound guided injections.  Past medical history, Surgical history, Family history not pertinant except as noted below, Social history, Allergies, and medications have been entered into the medical record, reviewed, and no changes needed.   Review of Systems: No fevers, chills, night sweats, weight loss, chest pain, or shortness of breath.   Objective:    General: Well Developed, well nourished, and in no acute distress.  Neuro: Alert and oriented x3, extra-ocular muscles intact, sensation grossly intact.  HEENT: Normocephalic, atraumatic, pupils equal round reactive to light, neck supple, no masses, no lymphadenopathy, thyroid nonpalpable.  Skin: Warm and dry, no rashes. Cardiac: Regular rate and rhythm, no murmurs rubs or gallops, no lower extremity edema.  Respiratory: Clear to auscultation bilaterally. Not using accessory muscles, speaking in full sentences.  Impression and Recommendations:

## 2014-09-20 DIAGNOSIS — Z9884 Bariatric surgery status: Secondary | ICD-10-CM | POA: Diagnosis not present

## 2014-09-20 DIAGNOSIS — R109 Unspecified abdominal pain: Secondary | ICD-10-CM | POA: Diagnosis not present

## 2014-09-20 DIAGNOSIS — K219 Gastro-esophageal reflux disease without esophagitis: Secondary | ICD-10-CM | POA: Diagnosis not present

## 2014-10-06 ENCOUNTER — Other Ambulatory Visit: Payer: Self-pay | Admitting: Family Medicine

## 2014-10-08 DIAGNOSIS — M961 Postlaminectomy syndrome, not elsewhere classified: Secondary | ICD-10-CM | POA: Diagnosis not present

## 2014-10-17 ENCOUNTER — Encounter: Payer: Self-pay | Admitting: Family Medicine

## 2014-10-17 ENCOUNTER — Ambulatory Visit (INDEPENDENT_AMBULATORY_CARE_PROVIDER_SITE_OTHER): Payer: Medicare Other | Admitting: Family Medicine

## 2014-10-17 VITALS — BP 127/83 | HR 103 | Wt 227.0 lb

## 2014-10-17 DIAGNOSIS — Z1239 Encounter for other screening for malignant neoplasm of breast: Secondary | ICD-10-CM

## 2014-10-17 DIAGNOSIS — Z111 Encounter for screening for respiratory tuberculosis: Secondary | ICD-10-CM

## 2014-10-17 DIAGNOSIS — Z0000 Encounter for general adult medical examination without abnormal findings: Secondary | ICD-10-CM | POA: Diagnosis not present

## 2014-10-17 DIAGNOSIS — Z79899 Other long term (current) drug therapy: Secondary | ICD-10-CM | POA: Diagnosis not present

## 2014-10-17 LAB — CBC
HCT: 39.3 % (ref 36.0–46.0)
Hemoglobin: 12.9 g/dL (ref 12.0–15.0)
MCH: 27 pg (ref 26.0–34.0)
MCHC: 32.8 g/dL (ref 30.0–36.0)
MCV: 82.4 fL (ref 78.0–100.0)
MPV: 10.3 fL (ref 8.6–12.4)
Platelets: 336 10*3/uL (ref 150–400)
RBC: 4.77 MIL/uL (ref 3.87–5.11)
RDW: 14.7 % (ref 11.5–15.5)
WBC: 9 10*3/uL (ref 4.0–10.5)

## 2014-10-17 LAB — COMPREHENSIVE METABOLIC PANEL
ALT: 11 U/L (ref 0–35)
AST: 20 U/L (ref 0–37)
Albumin: 4.1 g/dL (ref 3.5–5.2)
Alkaline Phosphatase: 137 U/L — ABNORMAL HIGH (ref 39–117)
BUN: 11 mg/dL (ref 6–23)
CO2: 26 mEq/L (ref 19–32)
Calcium: 9.2 mg/dL (ref 8.4–10.5)
Chloride: 101 mEq/L (ref 96–112)
Creat: 0.7 mg/dL (ref 0.50–1.10)
Glucose, Bld: 83 mg/dL (ref 70–99)
Potassium: 4.4 mEq/L (ref 3.5–5.3)
Sodium: 141 mEq/L (ref 135–145)
Total Bilirubin: 0.4 mg/dL (ref 0.2–1.2)
Total Protein: 7 g/dL (ref 6.0–8.3)

## 2014-10-17 LAB — LIPID PANEL
Cholesterol: 186 mg/dL (ref 0–200)
HDL: 60 mg/dL (ref >=46)
LDL Cholesterol: 86 mg/dL (ref 0–99)
Total CHOL/HDL Ratio: 3.1 Ratio
Triglycerides: 202 mg/dL — ABNORMAL HIGH (ref <150)
VLDL: 40 mg/dL (ref 0–40)

## 2014-10-17 LAB — HEPATITIS B SURFACE ANTIGEN: Hepatitis B Surface Ag: NEGATIVE

## 2014-10-17 LAB — TSH: TSH: 1.032 u[IU]/mL (ref 0.350–4.500)

## 2014-10-17 NOTE — Progress Notes (Signed)
CC: Katrina Fernandez is a 49 y.o. female is here for Annual Exam   Subjective: HPI:  Colonoscopy:No current indication Papsmear: Not indicated, total hysterectomy Mammogram: Due, orders placed  Influenza Vaccine: UTD Pneumovax: no indication Td/Tdap: UTD Zoster: (Start 47 yo)  Family services paperwork needs to be filled out today which requires hepatitis surface antigen, HIV, urine test, and PPD.  Review of Systems - General ROS: negative for - chills, fever, night sweats, weight gain or weight loss Ophthalmic ROS: negative for - decreased vision Psychological ROS: negative for - anxiety or depression ENT ROS: negative for - hearing change, nasal congestion, tinnitus or allergies Hematological and Lymphatic ROS: negative for - bleeding problems, bruising or swollen lymph nodes Breast ROS: negative Respiratory ROS: no cough, shortness of breath, or wheezing Cardiovascular ROS: no chest pain or dyspnea on exertion Gastrointestinal ROS: no abdominal pain, change in bowel habits, or black or bloody stools Genito-Urinary ROS: negative for - genital discharge, genital ulcers, incontinence or abnormal bleeding from genitals Musculoskeletal ROS: negative for - joint pain or muscle pain Neurological ROS: negative for - headaches or memory loss Dermatological ROS: negative for lumps, mole changes, rash and skin lesion changes  Past Medical History  Diagnosis Date  . Hyperlipidemia   . Spinal stenosis   . GERD (gastroesophageal reflux disease) 01/28/2012  . Right ankle pain 01/28/2012  . Depression 01/28/2012  . DVT (deep venous thrombosis)   . Edema leg     bilat    Past Surgical History  Procedure Laterality Date  . Back surgery      7 back surgeries  . Neck surgery  2006  . Laparoscopic gastric banding    . Carpal tunnel release      both  . Total abdominal hysterectomy  1994  . Appendectomy    . Cholecystectomy    . Laparoscopic gastric banding    . Breast reduction  surgery Bilateral 06/07/2013    Procedure: BILATERAL MAMMARY REDUCTION  (BREAST);  Surgeon: Theodoro Kos, DO;  Location: [facility];  Service: Plastics;  Laterality: Bilateral;  . Breast reduction surgery Bilateral 06/07/2013    Procedure:  LIPOSUCTION;  Surgeon: Theodoro Kos, DO;  Location: [facility];  Service: Plastics;  Laterality: Bilateral;   Family History  Problem Relation Age of Onset  . Depression    . Colon cancer Father   . Ovarian cancer Maternal Grandmother   . Pancreatic cancer Maternal Grandfather     History   Social History  . Marital Status: Married    Spouse Name: N/A  . Number of Children: N/A  . Years of Education: N/A   Occupational History  . Not on file.   Social History Main Topics  . Smoking status: Never Smoker   . Smokeless tobacco: Not on file  . Alcohol Use: No  . Drug Use: No  . Sexual Activity: Yes   Other Topics Concern  . Not on file   Social History Narrative     Objective: BP 127/83 mmHg  Pulse 103  Wt 227 lb (102.967 kg)  General: No Acute Distress HEENT: Atraumatic, normocephalic, conjunctivae normal without scleral icterus.  No nasal discharge, hearing grossly intact, TMs with good landmarks bilaterally with no middle ear abnormalities, posterior pharynx clear without oral lesions. Neck: Supple, trachea midline, no cervical nor supraclavicular adenopathy. Pulmonary: Clear to auscultation bilaterally without wheezing, rhonchi, nor rales. Cardiac: Regular rate and rhythm.  No murmurs, rubs, nor gallops. No peripheral edema.  2+ peripheral pulses bilaterally. Abdomen: Bowel sounds normal.  No masses.  Non-tender without rebound.  Negative Murphy's sign. MSK: Grossly intact, no signs of weakness.  Full strength throughout upper and lower extremities.  Full ROM in upper and lower extremities.  No midline spinal tenderness. Neuro: Gait unremarkable, CN II-XII grossly intact.  C5-C6 Reflex 2/4 Bilaterally,  L4 Reflex 2/4 Bilaterally.  Cerebellar function intact. Skin: No rashes. Slightly inflamed seborrheic keratoses on the back Psych: Alert and oriented to person/place/time.  Thought process normal. No anxiety/depression.  Assessment & Plan: [name] was seen today for annual exam.  Diagnoses and all orders for this visit:  Annual physical exam Orders: -     HIV antibody -     Urinalysis, Routine w reflex microscopic -     Comp Met (CMET) -     CBC -     Lipid panel -     Hepatitis B surface antigen -     TSH -     PPD  Screening for breast cancer Orders: -     MM DIGITAL SCREENING BILATERAL; Future  Healthy lifestyle interventions including but not limited to regular exercise, a healthy low fat diet, moderation of salt intake, the dangers of tobacco/alcohol/recreational drug use, nutrition supplementation, and accident avoidance were discussed with the patient and a handout was provided for future reference.  Return in about 2 days (around 10/19/2014) for PPD Check.

## 2014-10-18 DIAGNOSIS — M5137 Other intervertebral disc degeneration, lumbosacral region: Secondary | ICD-10-CM | POA: Diagnosis not present

## 2014-10-18 DIAGNOSIS — Z6841 Body Mass Index (BMI) 40.0 and over, adult: Secondary | ICD-10-CM | POA: Diagnosis not present

## 2014-10-18 LAB — URINALYSIS, ROUTINE W REFLEX MICROSCOPIC
Bilirubin Urine: NEGATIVE
Glucose, UA: NEGATIVE mg/dL
Hgb urine dipstick: NEGATIVE
Ketones, ur: NEGATIVE mg/dL
Leukocytes, UA: NEGATIVE
Nitrite: NEGATIVE
Protein, ur: NEGATIVE mg/dL
Specific Gravity, Urine: 1.01 (ref 1.005–1.030)
Urobilinogen, UA: 0.2 mg/dL (ref 0.0–1.0)
pH: 6 (ref 5.0–8.0)

## 2014-10-18 LAB — HIV ANTIBODY (ROUTINE TESTING W REFLEX): HIV 1&2 Ab, 4th Generation: NONREACTIVE

## 2014-10-19 DIAGNOSIS — M7712 Lateral epicondylitis, left elbow: Secondary | ICD-10-CM | POA: Diagnosis not present

## 2014-10-19 LAB — TB SKIN TEST
Induration: 0 mm
TB Skin Test: NEGATIVE

## 2014-10-23 DIAGNOSIS — M79604 Pain in right leg: Secondary | ICD-10-CM | POA: Diagnosis not present

## 2014-10-23 DIAGNOSIS — G894 Chronic pain syndrome: Secondary | ICD-10-CM | POA: Diagnosis not present

## 2014-10-23 DIAGNOSIS — M961 Postlaminectomy syndrome, not elsewhere classified: Secondary | ICD-10-CM | POA: Diagnosis not present

## 2014-10-23 DIAGNOSIS — T849XXS Unspecified complication of internal orthopedic prosthetic device, implant and graft, sequela: Secondary | ICD-10-CM | POA: Diagnosis not present

## 2014-10-29 DIAGNOSIS — M7712 Lateral epicondylitis, left elbow: Secondary | ICD-10-CM | POA: Diagnosis not present

## 2014-11-04 ENCOUNTER — Other Ambulatory Visit: Payer: Self-pay | Admitting: Family Medicine

## 2014-11-05 DIAGNOSIS — M7712 Lateral epicondylitis, left elbow: Secondary | ICD-10-CM | POA: Diagnosis not present

## 2014-11-12 ENCOUNTER — Other Ambulatory Visit: Payer: Self-pay | Admitting: Family Medicine

## 2014-11-14 ENCOUNTER — Ambulatory Visit (INDEPENDENT_AMBULATORY_CARE_PROVIDER_SITE_OTHER): Payer: Medicare Other

## 2014-11-14 DIAGNOSIS — Z1231 Encounter for screening mammogram for malignant neoplasm of breast: Secondary | ICD-10-CM | POA: Diagnosis not present

## 2014-11-14 DIAGNOSIS — Z1239 Encounter for other screening for malignant neoplasm of breast: Secondary | ICD-10-CM

## 2014-11-16 HISTORY — PX: OTHER SURGICAL HISTORY: SHX169

## 2014-12-10 ENCOUNTER — Encounter (HOSPITAL_BASED_OUTPATIENT_CLINIC_OR_DEPARTMENT_OTHER): Payer: Self-pay | Admitting: *Deleted

## 2014-12-10 NOTE — Progress Notes (Signed)
NPO AFTER MN WITH EXCEPTION CLEAR LIQUIDS UNTIL 0700 ( NO CREAM/ MILK PRODUCTS). ARRIVE AT 1130.  NEEDS HG.  WILL TAKE AM MEDS W/ EXCEPTION NO LASIX  DOS W/ SIPS OF WATER.

## 2014-12-12 ENCOUNTER — Other Ambulatory Visit: Payer: Self-pay | Admitting: Family Medicine

## 2014-12-13 ENCOUNTER — Ambulatory Visit (HOSPITAL_BASED_OUTPATIENT_CLINIC_OR_DEPARTMENT_OTHER)
Admission: RE | Admit: 2014-12-13 | Discharge: 2014-12-13 | Disposition: A | Discharge status: Home / Self Care | Payer: Medicare Other | Source: Ambulatory Visit | Attending: Orthopedic Surgery | Admitting: Orthopedic Surgery

## 2014-12-13 ENCOUNTER — Ambulatory Visit (HOSPITAL_BASED_OUTPATIENT_CLINIC_OR_DEPARTMENT_OTHER): Payer: Medicare Other | Admitting: Anesthesiology

## 2014-12-13 ENCOUNTER — Encounter (HOSPITAL_BASED_OUTPATIENT_CLINIC_OR_DEPARTMENT_OTHER)
Admission: RE | Disposition: A | Discharge status: Home / Self Care | Payer: Self-pay | Source: Ambulatory Visit | Attending: Orthopedic Surgery

## 2014-12-13 ENCOUNTER — Encounter (HOSPITAL_BASED_OUTPATIENT_CLINIC_OR_DEPARTMENT_OTHER): Payer: Self-pay

## 2014-12-13 DIAGNOSIS — M7712 Lateral epicondylitis, left elbow: Secondary | ICD-10-CM | POA: Diagnosis not present

## 2014-12-13 DIAGNOSIS — I739 Peripheral vascular disease, unspecified: Secondary | ICD-10-CM | POA: Diagnosis not present

## 2014-12-13 DIAGNOSIS — Z79891 Long term (current) use of opiate analgesic: Secondary | ICD-10-CM | POA: Diagnosis not present

## 2014-12-13 DIAGNOSIS — Z86718 Personal history of other venous thrombosis and embolism: Secondary | ICD-10-CM | POA: Diagnosis not present

## 2014-12-13 DIAGNOSIS — K219 Gastro-esophageal reflux disease without esophagitis: Secondary | ICD-10-CM | POA: Insufficient documentation

## 2014-12-13 DIAGNOSIS — E785 Hyperlipidemia, unspecified: Secondary | ICD-10-CM | POA: Diagnosis not present

## 2014-12-13 DIAGNOSIS — G894 Chronic pain syndrome: Secondary | ICD-10-CM | POA: Diagnosis not present

## 2014-12-13 DIAGNOSIS — M1990 Unspecified osteoarthritis, unspecified site: Secondary | ICD-10-CM | POA: Diagnosis not present

## 2014-12-13 DIAGNOSIS — S56512A Strain of other extensor muscle, fascia and tendon at forearm level, left arm, initial encounter: Secondary | ICD-10-CM | POA: Insufficient documentation

## 2014-12-13 DIAGNOSIS — Z792 Long term (current) use of antibiotics: Secondary | ICD-10-CM | POA: Insufficient documentation

## 2014-12-13 DIAGNOSIS — Z6839 Body mass index (BMI) 39.0-39.9, adult: Secondary | ICD-10-CM | POA: Insufficient documentation

## 2014-12-13 DIAGNOSIS — F419 Anxiety disorder, unspecified: Secondary | ICD-10-CM | POA: Diagnosis not present

## 2014-12-13 DIAGNOSIS — S53492A Other sprain of left elbow, initial encounter: Secondary | ICD-10-CM | POA: Diagnosis not present

## 2014-12-13 DIAGNOSIS — X58XXXA Exposure to other specified factors, initial encounter: Secondary | ICD-10-CM | POA: Insufficient documentation

## 2014-12-13 DIAGNOSIS — F329 Major depressive disorder, single episode, unspecified: Secondary | ICD-10-CM | POA: Insufficient documentation

## 2014-12-13 DIAGNOSIS — Z79899 Other long term (current) drug therapy: Secondary | ICD-10-CM | POA: Diagnosis not present

## 2014-12-13 DIAGNOSIS — M25522 Pain in left elbow: Secondary | ICD-10-CM | POA: Diagnosis present

## 2014-12-13 DIAGNOSIS — M7711 Lateral epicondylitis, right elbow: Secondary | ICD-10-CM

## 2014-12-13 HISTORY — DX: Unspecified osteoarthritis, unspecified site: M19.90

## 2014-12-13 HISTORY — DX: Other constipation: K59.09

## 2014-12-13 HISTORY — DX: Other specified postprocedural states: Z98.890

## 2014-12-13 HISTORY — DX: Chronic pain syndrome: G89.4

## 2014-12-13 HISTORY — PX: TENNIS ELBOW RELEASE/NIRSCHEL PROCEDURE: SHX6651

## 2014-12-13 HISTORY — DX: Personal history of other venous thrombosis and embolism: Z86.718

## 2014-12-13 HISTORY — DX: Other specified postprocedural states: R11.2

## 2014-12-13 LAB — POCT HEMOGLOBIN-HEMACUE: Hemoglobin: 12.4 g/dL (ref 12.0–15.0)

## 2014-12-13 SURGERY — TENNIS ELBOW RELEASE/NIRSCHEL PROCEDURE
Anesthesia: General | Site: Elbow | Laterality: Left

## 2014-12-13 MED ORDER — DOCUSATE SODIUM 100 MG PO CAPS: 100.0000 mg | ORAL_CAPSULE | Freq: Two times a day (BID) | ORAL | Status: DC

## 2014-12-13 MED ORDER — SUCCINYLCHOLINE CHLORIDE 20 MG/ML IJ SOLN
INTRAMUSCULAR | Status: DC | PRN
  Administered 2014-12-13: 100 mg via INTRAVENOUS

## 2014-12-13 MED ORDER — FENTANYL CITRATE (PF) 100 MCG/2ML IJ SOLN
INTRAMUSCULAR | Status: AC
  Filled 2014-12-13: qty 4

## 2014-12-13 MED ORDER — CLINDAMYCIN PHOSPHATE 900 MG/50ML IV SOLN
INTRAVENOUS | Status: AC
  Filled 2014-12-13: qty 50

## 2014-12-13 MED ORDER — BUPIVACAINE HCL (PF) 0.25 % IJ SOLN
INTRAMUSCULAR | Status: DC | PRN
  Administered 2014-12-13: 10 mL

## 2014-12-13 MED ORDER — DEXAMETHASONE SODIUM PHOSPHATE 4 MG/ML IJ SOLN
INTRAMUSCULAR | Status: DC | PRN
  Administered 2014-12-13: 10 mg via INTRAVENOUS

## 2014-12-13 MED ORDER — FENTANYL CITRATE (PF) 100 MCG/2ML IJ SOLN
INTRAMUSCULAR | Status: DC | PRN
  Administered 2014-12-13 (×2): 50 ug via INTRAVENOUS

## 2014-12-13 MED ORDER — HYDROMORPHONE HCL 1 MG/ML IJ SOLN
INTRAMUSCULAR | Status: AC
  Filled 2014-12-13: qty 1

## 2014-12-13 MED ORDER — ONDANSETRON HCL 4 MG/2ML IJ SOLN
INTRAMUSCULAR | Status: DC | PRN
  Administered 2014-12-13: 4 mg via INTRAVENOUS

## 2014-12-13 MED ORDER — MIDAZOLAM HCL 2 MG/2ML IJ SOLN
INTRAMUSCULAR | Status: AC
  Filled 2014-12-13: qty 2

## 2014-12-13 MED ORDER — METHOCARBAMOL 500 MG PO TABS: 500.0000 mg | ORAL_TABLET | Freq: Four times a day (QID) | ORAL | Status: DC

## 2014-12-13 MED ORDER — SCOPOLAMINE 1 MG/3DAYS TD PT72
MEDICATED_PATCH | TRANSDERMAL | Status: AC
  Filled 2014-12-13: qty 1

## 2014-12-13 MED ORDER — LACTATED RINGERS IV SOLN
INTRAVENOUS | Status: DC
  Administered 2014-12-13 (×3): via INTRAVENOUS
  Filled 2014-12-13: qty 1000

## 2014-12-13 MED ORDER — PROMETHAZINE HCL 25 MG/ML IJ SOLN
6.2500 mg | INTRAMUSCULAR | Status: DC | PRN
  Administered 2014-12-13: 6.25 mg via INTRAVENOUS
  Filled 2014-12-13: qty 1

## 2014-12-13 MED ORDER — ACETAMINOPHEN 10 MG/ML IV SOLN
INTRAVENOUS | Status: DC | PRN
  Administered 2014-12-13: 1000 mg via INTRAVENOUS

## 2014-12-13 MED ORDER — HYDROMORPHONE HCL 1 MG/ML IJ SOLN
0.2500 mg | INTRAMUSCULAR | Status: DC | PRN
  Administered 2014-12-13: 0.5 mg via INTRAVENOUS
  Filled 2014-12-13: qty 1

## 2014-12-13 MED ORDER — PROMETHAZINE HCL 25 MG/ML IJ SOLN
INTRAMUSCULAR | Status: AC
  Filled 2014-12-13: qty 1

## 2014-12-13 MED ORDER — LIDOCAINE HCL (CARDIAC) 20 MG/ML IV SOLN
INTRAVENOUS | Status: DC | PRN
  Administered 2014-12-13: 80 mg via INTRAVENOUS

## 2014-12-13 MED ORDER — PROPOFOL 10 MG/ML IV BOLUS
INTRAVENOUS | Status: DC | PRN
  Administered 2014-12-13: 50 mg via INTRAVENOUS
  Administered 2014-12-13: 200 mg via INTRAVENOUS

## 2014-12-13 MED ORDER — CLINDAMYCIN PHOSPHATE 900 MG/50ML IV SOLN
900.0000 mg | INTRAVENOUS | Status: AC
  Administered 2014-12-13: 900 mg via INTRAVENOUS
  Filled 2014-12-13: qty 50

## 2014-12-13 MED ORDER — MIDAZOLAM HCL 5 MG/5ML IJ SOLN
INTRAMUSCULAR | Status: DC | PRN
  Administered 2014-12-13: 2 mg via INTRAVENOUS

## 2014-12-13 MED ORDER — KETOROLAC TROMETHAMINE 30 MG/ML IJ SOLN
INTRAMUSCULAR | Status: DC | PRN
  Administered 2014-12-13: 30 mg via INTRAVENOUS

## 2014-12-13 MED ORDER — CHLORHEXIDINE GLUCONATE 4 % EX LIQD
60.0000 mL | Freq: Once | CUTANEOUS | Status: DC
  Filled 2014-12-13: qty 60

## 2014-12-13 MED ORDER — SCOPOLAMINE 1 MG/3DAYS TD PT72
1.0000 | MEDICATED_PATCH | Freq: Once | TRANSDERMAL | Status: AC
  Administered 2014-12-13: 1 via TRANSDERMAL
  Administered 2014-12-13: 1.5 mg via TRANSDERMAL
  Filled 2014-12-13: qty 1

## 2014-12-13 SURGICAL SUPPLY — 61 items
ANCH SUT 2-0 MIC STRG FWD TPR (Anchor) ×2 IMPLANT
ANCHOR SUTURETAK 2.4X8.5 MINI (Anchor) ×2 IMPLANT
APL SKNCLS STERI-STRIP NONHPOA (GAUZE/BANDAGES/DRESSINGS)
BANDAGE ELASTIC 3 VELCRO ST LF (GAUZE/BANDAGES/DRESSINGS) ×1 IMPLANT
BANDAGE ELASTIC 4 VELCRO ST LF (GAUZE/BANDAGES/DRESSINGS) ×1 IMPLANT
BENZOIN TINCTURE PRP APPL 2/3 (GAUZE/BANDAGES/DRESSINGS) ×1 IMPLANT
BLADE SURG 15 STRL LF DISP TIS (BLADE) ×1 IMPLANT
BLADE SURG 15 STRL SS (BLADE) ×2
BNDG GAUZE ELAST 4 BULKY (GAUZE/BANDAGES/DRESSINGS) ×1 IMPLANT
CORDS BIPOLAR (ELECTRODE) ×2 IMPLANT
COVER BACK TABLE 60X90IN (DRAPES) ×2 IMPLANT
CUFF TOURNIQUET SINGLE 18IN (TOURNIQUET CUFF) ×1 IMPLANT
DRAIN PENROSE 18X1/4 LTX STRL (WOUND CARE) IMPLANT
DRAPE EXTREMITY T 121X128X90 (DRAPE) ×2 IMPLANT
DRAPE SURG 17X23 STRL (DRAPES) ×2 IMPLANT
DRSG EMULSION OIL 3X3 NADH (GAUZE/BANDAGES/DRESSINGS) ×1 IMPLANT
DRSG KUZMA FLUFF (GAUZE/BANDAGES/DRESSINGS) ×1 IMPLANT
GAUZE XEROFORM 1X8 LF (GAUZE/BANDAGES/DRESSINGS) ×2 IMPLANT
GLOVE BIOGEL PI IND STRL 8.5 (GLOVE) ×1 IMPLANT
GLOVE BIOGEL PI INDICATOR 8.5 (GLOVE) ×1
GLOVE SURG ORTHO 8.0 STRL STRW (GLOVE) ×2 IMPLANT
GOWN STRL REUS W/ TWL XL LVL3 (GOWN DISPOSABLE) ×1 IMPLANT
GOWN STRL REUS W/TWL 2XL LVL3 (GOWN DISPOSABLE) ×2 IMPLANT
GOWN STRL REUS W/TWL XL LVL3 (GOWN DISPOSABLE) ×2
KIT MINI BIO ANCHOR DRILL (KITS) ×1 IMPLANT
LOOP VESSEL MAXI BLUE (MISCELLANEOUS) IMPLANT
LOOP VESSEL MINI RED (MISCELLANEOUS) IMPLANT
MANIFOLD NEPTUNE II (INSTRUMENTS) IMPLANT
NDL HYPO 25X1 1.5 SAFETY (NEEDLE) IMPLANT
NEEDLE HYPO 25X1 1.5 SAFETY (NEEDLE) IMPLANT
NS IRRIG 500ML POUR BTL (IV SOLUTION) ×2 IMPLANT
PACK BASIN DAY SURGERY FS (CUSTOM PROCEDURE TRAY) ×2 IMPLANT
PAD CAST 3X4 CTTN HI CHSV (CAST SUPPLIES) IMPLANT
PAD CAST 4YDX4 CTTN HI CHSV (CAST SUPPLIES) IMPLANT
PADDING CAST ABS 4INX4YD NS (CAST SUPPLIES) ×1
PADDING CAST ABS COTTON 4X4 ST (CAST SUPPLIES) ×1 IMPLANT
PADDING CAST COTTON 3X4 STRL (CAST SUPPLIES) ×2
PADDING CAST COTTON 4X4 STRL (CAST SUPPLIES)
SPLINT FAST PLASTER 5X30 (CAST SUPPLIES)
SPLINT FIBERGLASS 3X35 (CAST SUPPLIES) ×2 IMPLANT
SPLINT FIBERGLASS 4X30 (CAST SUPPLIES) IMPLANT
SPLINT PLASTER CAST FAST 5X30 (CAST SUPPLIES) IMPLANT
SPLINT PLASTER CAST XFAST 3X15 (CAST SUPPLIES) IMPLANT
SPLINT PLASTER XTRA FASTSET 3X (CAST SUPPLIES) ×1
SPONGE GAUZE 4X4 12PLY (GAUZE/BANDAGES/DRESSINGS) ×2 IMPLANT
STOCKINETTE 4X48 STRL (DRAPES) ×2 IMPLANT
STRIP CLOSURE SKIN 1/2X4 (GAUZE/BANDAGES/DRESSINGS) ×2 IMPLANT
SUCTION FRAZIER TIP 10 FR DISP (SUCTIONS) IMPLANT
SUT ETHIBOND 3-0 V-5 (SUTURE) IMPLANT
SUT ETHILON 4 0 PS 2 18 (SUTURE) IMPLANT
SUT MERSILENE 4 0 P 3 (SUTURE) IMPLANT
SUT PROLENE 3 0 PS 2 (SUTURE) ×1 IMPLANT
SUT PROLENE 4 0 PS 2 18 (SUTURE) IMPLANT
SUT VIC AB 2-0 SH 27 (SUTURE) ×2
SUT VIC AB 2-0 SH 27XBRD (SUTURE) IMPLANT
SUT VICRYL 4-0 PS2 18IN ABS (SUTURE) ×1 IMPLANT
SYR BULB 3OZ (MISCELLANEOUS) ×2 IMPLANT
SYR CONTROL 10ML LL (SYRINGE) IMPLANT
TOWEL OR 17X24 6PK STRL BLUE (TOWEL DISPOSABLE) ×2 IMPLANT
TUBE CONNECTING 12X1/4 (SUCTIONS) IMPLANT
UNDERPAD 30X30 INCONTINENT (UNDERPADS AND DIAPERS) ×2 IMPLANT

## 2014-12-13 NOTE — Anesthesia Preprocedure Evaluation (Addendum)
Anesthesia Evaluation  Patient identified by MRN, date of birth, ID band Patient awake    Reviewed: Allergy & Precautions, NPO status , Patient's Chart, lab work & pertinent test results  History of Anesthesia Complications (+) PONV, DIFFICULT IV STICK / SPECIAL LINE and history of anesthetic complications  Airway Mallampati: II  TM Distance: <3 FB Neck ROM: Full    Dental  (+) Teeth Intact   Pulmonary  breath sounds clear to auscultation        Cardiovascular + Peripheral Vascular Disease Rhythm:Regular Rate:Normal     Neuro/Psych Anxiety Depression    GI/Hepatic Neg liver ROS, GERD-  Medicated and Poorly Controlled,  Endo/Other  Morbid obesity  Renal/GU negative Renal ROS     Musculoskeletal  (+) Arthritis -, Osteoarthritis,  Chronic pain   Abdominal (+) + obese,   Peds  Hematology   Anesthesia Other Findings Chronic pain management- Methadone  Reproductive/Obstetrics                           Anesthesia Physical Anesthesia Plan  ASA: III  Anesthesia Plan: General   Post-op Pain Management:    Induction: Intravenous  Airway Management Planned: Oral ETT  Additional Equipment:   Intra-op Plan:   Post-operative Plan: Extubation in OR  Informed Consent: I have reviewed the patients History and Physical, chart, labs and discussed the procedure including the risks, benefits and alternatives for the proposed anesthesia with the patient or authorized representative who has indicated his/her understanding and acceptance.   Dental advisory given  Plan Discussed with: CRNA and Surgeon  Anesthesia Plan Comments:         Anesthesia Quick Evaluation

## 2014-12-13 NOTE — Discharge Instructions (Signed)
KEEP BANDAGE CLEAN AND DRY CALL OFFICE FOR F/U APPT [phone] in 2 weeks KEEP HAND ELEVATED ABOVE HEART OK TO APPLY ICE TO OPERATIVE AREA CONTACT OFFICE IF ANY WORSENING PAIN OR CONCERNS.Call your surgeon if you experience:   1.  Fever over 101.0. 2.  Inability to urinate. 3.  Nausea and/or vomiting. 4.  Extreme swelling or bruising at the surgical site. 5.  Continued bleeding from the incision. 6.  Increased pain, redness or drainage from the incision. 7.  Problems related to your pain medication. 8. Any change in color, movement and/or sensation 9. Any problems and/or concerns Post Anesthesia Home Care Instructions  Activity: Get plenty of rest for the remainder of the day. A responsible adult should stay with you for 24 hours following the procedure.  For the next 24 hours, DO NOT: -Drive a car -Paediatric nurse -Drink alcoholic beverages -Take any medication unless instructed by your physician -Make any legal decisions or sign important papers.  Meals: Start with liquid foods such as gelatin or soup. Progress to regular foods as tolerated. Avoid greasy, spicy, heavy foods. If nausea and/or vomiting occur, drink only clear liquids until the nausea and/or vomiting subsides. Call your physician if vomiting continues.  Special Instructions/Symptoms: Your throat may feel dry or sore from the anesthesia or the breathing tube placed in your throat during surgery. If this causes discomfort, gargle with warm salt water. The discomfort should disappear within 24 hours.  If you had a scopolamine patch placed behind your ear for the management of post- operative nausea and/or vomiting:  1. The medication in the patch is effective for 72 hours, after which it should be removed.  Wrap patch in a tissue and discard in the trash. Wash hands thoroughly with soap and water. 2. You may remove the patch earlier than 72 hours if you experience unpleasant side effects which may include dry mouth,  dizziness or visual disturbances. 3. Avoid touching the patch. Wash your hands with soap and water after contact with the patch.

## 2014-12-13 NOTE — H&P (Signed)
Katrina Fernandez is an 49 y.o. female.   Chief Complaint: left elbow chronic lateral elbow pain HPI: PT FOLLOWED IN OFFICE PT HERE FOR SURGERY ON LEFT ELBOW NO PRIOR SURGERY TO LEFT ELBOW PAIN IN ELBOW GREATER THAN ONE YEAR   Past Medical History  Diagnosis Date  . Hyperlipidemia   . Depression   . GERD (gastroesophageal reflux disease)   . History of DVT of lower extremity     LEFT LEG --  3/ 2014  . Arthritis   . Left elbow pain     LEFT EXTENSOR TEAR  . PONV (postoperative nausea and vomiting)   . Chronic constipation   . Chronic pain syndrome     chronic methodone  . Lactose intolerance   . Lower extremity edema     Past Surgical History  Procedure Laterality Date  . Carpal tunnel release Bilateral 2001  . Cholecystectomy    . Laparoscopic gastric banding  2011  . Breast reduction surgery Bilateral 06/07/2013    Procedure: BILATERAL MAMMARY REDUCTION  (BREAST);  Surgeon: Theodoro Kos, DO;  Location: [facility];  Service: Plastics;  Laterality: Bilateral;  . Breast reduction surgery Bilateral 06/07/2013    Procedure:  LIPOSUCTION;  Surgeon: Theodoro Kos, DO;  Location: [facility];  Service: Plastics;  Laterality: Bilateral;  . Posterior cervical laminectomy  03-01-2001    C7-8 and Exploration C8 perineural cyst  . Placement lumboperitoneal shunt for cerebrospinal fluid diversion away from c8 perineural cyst  09-05-2003  &  12-24-2003    08-31-2003  REMOVAL SHUNT  . Posterior lumbar laminectomy l4-5/  diskectomy l5-s1  05-20-2005  . Re-do decompression laminectomy and fusion l4-5  &  l5-s1  12-20-2006    02-04-2007--  RE-EXPLORATION L4 to S1, I & D LUMBAR WOUND HEMATOMA  . Anterior cervical decomp/discectomy fusion  07-25-2009    C5 -- C6  . Appendectomy  age 16  . Total abdominal hysterectomy  1994    w/  Bilateral Salpinoophorectomy  . Negative sleep study  2010  per pt    Family History  Problem Relation Age of Onset  .  Depression    . Colon cancer Father   . Ovarian cancer Maternal Grandmother   . Pancreatic cancer Maternal Grandfather    Social History:  reports that she has never smoked. She has never used smokeless tobacco. She reports that she does not drink alcohol or use illicit drugs.  Allergies:  Allergies  Allergen Reactions  . Penicillins Anaphylaxis  . Tape Rash    Plastic tape    Medications Prior to Admission  Medication Sig Dispense Refill  . furosemide (LASIX) 20 MG tablet TAKE 2 TABS BY EVERY MORNING AND 1 TAB EVERY AFTERNOON,ONLY AS NEEDED FOR LEG SWELLING (Patient taking differently: TAKE 1 TAB BY EVERY MORNING , FOR LEG SWELLING) 90 tablet 1  . levETIRAcetam (KEPPRA) 500 MG tablet Take 500 mg by mouth 2 (two) times daily.     Marland Kitchen loratadine (CLARITIN) 10 MG tablet Take 1 tablet (10 mg total) by mouth daily. 90 tablet 2  . methadone (DOLOPHINE) 10 MG tablet Take 10 mg by mouth every 8 (eight) hours.     . Polyethylene Glycol 3350 (MIRALAX PO) Take by mouth daily.    . simvastatin (ZOCOR) 20 MG tablet Take 1 tablet (20 mg total) by mouth every evening. 90 tablet 2  . tiZANidine (ZANAFLEX) 4 MG capsule Take 4-8 mg by mouth 2 (two) times daily. ONE TAB AM -  PRN AND 2 TABS AT BEDTIME    . venlafaxine XR (EFFEXOR-XR) 75 MG 24 hr capsule Take 150 mg by mouth daily with breakfast.    . clonazePAM (KLONOPIN) 0.5 MG tablet TAKE ONE TABLET BY MOUTH DAILY AS NEEDED  30 tablet 0  . venlafaxine XR (EFFEXOR-XR) 75 MG 24 hr capsule TAKE ONE CAPSULE BY MOUTH ONCE DAILY FOR 5 DAYS THEN INCREASE TO TWO CAPSULES DAILY THEREAFTER 60 capsule 0    No results found for this or any previous visit (from the past 32 hour(s)). No results found.  ROS NO RECENT ILLNESSES OR HOSPITALIZATIONS  Blood pressure 119/55, pulse 90, temperature 98.8 F (37.1 C), temperature source Oral, resp. rate 12, height 5\' 3"  (1.6 m), weight 100.018 kg (220 lb 8 oz), SpO2 97 %. Physical Exam  General Appearance:  Alert,  cooperative, no distress, appears stated age  Head:  Normocephalic, without obvious abnormality, atraumatic  Eyes:  Pupils equal, conjunctiva/corneas clear,         Throat: Lips, mucosa, and tongue normal; teeth and gums normal  Neck: No visible masses     Lungs:   respirations unlabored  Chest Wall:  No tenderness or deformity  Heart:  Regular rate and rhythm,  Abdomen:   Soft, non-tender,         Extremities: LEFT ELBOW: TTP OVER LATERAL EPICONDYLE GOOD WRIST AND DIGITAL MOTION NVI LEFT HAND GOOD WRIST AND FOREARM MUSCLE TONE NO GROSS INSTABILITY TO ELBOW   Pulses: 2+ and symmetric  Skin: Skin color, texture, turgor normal, no rashes or lesions     Neurologic: Normal    Assessment/Plan LEFT ELBOW CHRONIC LATERAL EPICONDYLITIS  LEFT ELBOW LATERAL EPICONDYLAR DEBRIDEMENT AND REPAIR AS INDICATED  R/B/A DISCUSSED WITH PT IN OFFICE.  PT VOICED UNDERSTANDING OF PLAN CONSENT SIGNED DAY OF SURGERY PT SEEN AND EXAMINED PRIOR TO OPERATIVE PROCEDURE/DAY OF SURGERY SITE MARKED. QUESTIONS ANSWERED WILL GO HOME FOLLOWING SURGERY WE ARE PLANNING SURGERY FOR YOUR UPPER EXTREMITY. THE RISKS AND BENEFITS OF SURGERY INCLUDE BUT NOT LIMITED TO BLEEDING INFECTION, DAMAGE TO NEARBY NERVES ARTERIES TENDONS, FAILURE OF SURGERY TO ACCOMPLISH ITS INTENDED GOALS, PERSISTENT SYMPTOMS AND NEED FOR FURTHER SURGICAL INTERVENTION. WITH THIS IN MIND WE WILL PROCEED. I HAVE DISCUSSED WITH THE PATIENT THE PRE AND POSTOPERATIVE REGIMEN AND THE DOS AND DON'TS. PT VOICED UNDERSTANDING AND INFORMED CONSENT SIGNED. Katrina Fernandez 12/13/2014, 12:25 PM

## 2014-12-13 NOTE — Brief Op Note (Signed)
12/13/2014  12:28 PM  PATIENT:  [name]  49 y.o. female  PRE-OPERATIVE DIAGNOSIS:  LEFT ELBOW LATERAL EXTENSOR TEAR   POST-OPERATIVE DIAGNOSIS:  * No post-op diagnosis entered *  PROCEDURE:  Procedure(s): LEFT ELBOW LATERAL EPICONDYLAR DEBRIDEMENT AND OR REPAIR -RECONSTRUCTION  (Left)  SURGEON:  Surgeon(s) and Role:    * Iran Planas, MD - Primary  PHYSICIAN ASSISTANT:   ASSISTANTS: none   ANESTHESIA:   general  EBL:     BLOOD ADMINISTERED:none  DRAINS: none   LOCAL MEDICATIONS USED:  MARCAINE     SPECIMEN:  No Specimen  DISPOSITION OF SPECIMEN:  N/A  COUNTS:  YES  TOURNIQUET:    DICTATION: .Other Dictation: Dictation Number [id number]  PLAN OF CARE: Discharge to home after PACU  PATIENT DISPOSITION:  PACU - hemodynamically stable.   Delay start of Pharmacological VTE agent (>24hrs) due to surgical blood loss or risk of bleeding: not applicable

## 2014-12-13 NOTE — Anesthesia Procedure Notes (Signed)
Procedure Name: Intubation Date/Time: 12/13/2014 1:10 PM Performed by: Wanita Chamberlain Pre-anesthesia Checklist: Patient identified, Timeout performed, Emergency Drugs available, Suction available and Patient being monitored Patient Re-evaluated:Patient Re-evaluated prior to inductionOxygen Delivery Method: Circle system utilized Preoxygenation: Pre-oxygenation with 100% oxygen Intubation Type: IV induction Laryngoscope Size: Mac and 3 Tube type: Oral Tube size: 7.0 mm Number of attempts: 1 Airway Equipment and Method: Stylet Placement Confirmation: breath sounds checked- equal and bilateral and positive ETCO2 Secured at: 22 cm Tube secured with: Tape Dental Injury: Teeth and Oropharynx as per pre-operative assessment

## 2014-12-13 NOTE — Transfer of Care (Signed)
Immediate Anesthesia Transfer of Care Note  Patient: Katrina Fernandez  Procedure(s) Performed: Procedure(s): LEFT ELBOW LATERAL EPICONDYLAR DEBRIDEMENT AND OR REPAIR -RECONSTRUCTION  (Left)  Patient Location: PACU  Anesthesia Type:General  Level of Consciousness: awake, alert , oriented and patient cooperative  Airway & Oxygen Therapy: Patient Spontanous Breathing and Patient connected to nasal cannula oxygen  Post-op Assessment: Report given to RN and Post -op Vital signs reviewed and stable  Post vital signs: Reviewed and stable  Last Vitals:  Filed Vitals:   12/13/14 1124  BP: 119/55  Pulse: 90  Temp: 37.1 C  Resp: 12    Complications: No apparent anesthesia complications

## 2014-12-13 NOTE — Anesthesia Postprocedure Evaluation (Signed)
  Anesthesia Post-op Note  Patient: Katrina Fernandez  Procedure(s) Performed: Procedure(s) (LRB): LEFT ELBOW LATERAL EPICONDYLAR DEBRIDEMENT AND OR REPAIR -RECONSTRUCTION  (Left)  Patient Location: PACU  Anesthesia Type: General  Level of Consciousness: awake and alert   Airway and Oxygen Therapy: Patient Spontanous Breathing  Post-op Pain: mild  Post-op Assessment: Post-op Vital signs reviewed, Patient's Cardiovascular Status Stable, Respiratory Function Stable, Patent Airway and No signs of Nausea or vomiting  Last Vitals:  Filed Vitals:   12/13/14 1615  BP: 130/78  Pulse: 75  Temp: 36.4 C  Resp: 14    Post-op Vital Signs: stable   Complications: No apparent anesthesia complications

## 2014-12-14 ENCOUNTER — Encounter (HOSPITAL_BASED_OUTPATIENT_CLINIC_OR_DEPARTMENT_OTHER): Payer: Self-pay | Admitting: Orthopedic Surgery

## 2014-12-14 NOTE — Op Note (Signed)
Katrina Fernandez, Katrina Fernandez            ACCOUNT NO.:  [id number]  MEDICAL RECORD NO.:  [id number]  LOCATION:                               FACILITY:  [facility]  PHYSICIAN:  [name] IV, M.D.DATE OF BIRTH:  1965/10/03  DATE OF PROCEDURE:  12/13/2014 DATE OF DISCHARGE:  12/13/2014                              OPERATIVE REPORT   PREOPERATIVE DIAGNOSIS:  Left elbow lateral epicondylar common extensor tear, extensor carpi radialis brevis (ECRB) tear.  POSTOPERATIVE DIAGNOSIS:  Left elbow lateral epicondylar common extensor tear, extensor carpi radialis brevis (ECRB) tear.  ATTENDING PHYSICIAN:  Melrose Nakayama, M.D., who scrubbed and was present for the entire procedure.  ASSISTANT SURGEON:  None.  ANESTHESIA:  General via LMA.  SURGICAL PROCEDURE: 1. Left elbow common extensor, ECRB debridement and reattachments of     bone with suture anchors. 2. Left elbow joint arthrotomy, exploration.  SURGICAL IMPLANTS:  2 Arthrex and Mini BioComposite SutureTak Anchors with FiberWire suture.  SURGICAL INDICATIONS:  [name] is a right-hand dominant female with persistent lateral elbow pain.  The patient failed nonsurgical treatment and elected to undergo the above procedure.  Risks, benefits, and alternatives were discussed in detail with the patient.  Signed informed consent was obtained.  Risks include, but not limited to, bleeding; infection; damage to nearby nerves, arteries, or tendons; loss of motion to the wrist and digits; incomplete relief of symptoms; and need for further surgical intervention.  DESCRIPTION OF PROCEDURE:  The patient was properly identified in the preoperative holding area and a mark with a permanent marker made on the left elbow to indicate the correct operative site.  The patient was then brought back to the operating room, placed supine on anesthesia table. General anesthesia was administered.  The patient tolerated this well. A well-padded  tourniquet placed on the left brachium, sealed with 1000 drape.  The left upper extremity was then prepped and draped in normal sterile fashion.  Time-out was called, correct site was identified, and procedure then begun.  Attention then turned to the left elbow, where an anterior incision was made directly over the lateral condyle.  Limb was elevated and tourniquet insufflated.  Dissection was carried down through the skin and subcutaneous tissue.  Deep dissection carried down to the extensor aponeurosis, was incised longitudinally.  The interval between the ECRL and ECRB was then carefully elevated.  In the origin, an ECRB was then elevated.  There was some mild retraction of the common extensor and ECRB origin.  Once this was carried out, degenerative tissue was then removed of this area.  Dissection was carried out of the joint.  Joint arthrotomy was then carried out.  Joint was then opened up and further inspection carried out.  There did not appear to be gross instability or any evidence of lateral ulnar collateral ligament insufficiency on stress examination intraoperatively.  Once this was done, the ECRB origin at the common extensor tendon was then reattached with 2 suture anchors in place in the lateral epicondylar origin. Following this, the suture was then brought down and replaced through the tissue and then tied down in a horizontal mattress fashion nicely. This was augmented with several FiberWire sutures  laterally.  Once this was carried out, the extensor aponeurosis was then closed with a 2-0 Vicryl suture.  Subcutaneous tissues closed with 2-0 and 4-0 Vicryl suture.  Thorough wound irrigation done in every layer of closure.  Skin was then closed using horizontal mattress Prolene sutures.  10 mL of 0.25% Marcaine infiltrated locally.  Adaptic dressing, sterile compressive bandage then applied.  The patient was then placed in a well- padded long-arm splint, extubated, and  taken to recovery room in good condition.  POSTOPERATIVE PLAN:  The patient will be discharged to home, seen back in the office in approximately 2 weeks for wound check, suture removal, and begin a postoperative ECRB repair protocol.     Melrose Nakayama, M.D.     FWO/MEDQ  D:  12/13/2014  T:  12/14/2014  Job:  [id number]

## 2014-12-21 DIAGNOSIS — M79604 Pain in right leg: Secondary | ICD-10-CM | POA: Diagnosis not present

## 2014-12-21 DIAGNOSIS — M961 Postlaminectomy syndrome, not elsewhere classified: Secondary | ICD-10-CM | POA: Diagnosis not present

## 2014-12-21 DIAGNOSIS — M5440 Lumbago with sciatica, unspecified side: Secondary | ICD-10-CM | POA: Diagnosis not present

## 2014-12-21 DIAGNOSIS — R51 Headache: Secondary | ICD-10-CM | POA: Diagnosis not present

## 2014-12-28 DIAGNOSIS — M25422 Effusion, left elbow: Secondary | ICD-10-CM | POA: Diagnosis not present

## 2014-12-28 DIAGNOSIS — M25522 Pain in left elbow: Secondary | ICD-10-CM | POA: Diagnosis not present

## 2015-01-01 DIAGNOSIS — M961 Postlaminectomy syndrome, not elsewhere classified: Secondary | ICD-10-CM | POA: Diagnosis not present

## 2015-01-01 DIAGNOSIS — M5137 Other intervertebral disc degeneration, lumbosacral region: Secondary | ICD-10-CM | POA: Diagnosis not present

## 2015-01-01 DIAGNOSIS — Z982 Presence of cerebrospinal fluid drainage device: Secondary | ICD-10-CM | POA: Diagnosis not present

## 2015-01-04 DIAGNOSIS — M25522 Pain in left elbow: Secondary | ICD-10-CM | POA: Diagnosis not present

## 2015-01-04 DIAGNOSIS — M25422 Effusion, left elbow: Secondary | ICD-10-CM | POA: Diagnosis not present

## 2015-01-09 ENCOUNTER — Other Ambulatory Visit: Payer: Self-pay | Admitting: Family Medicine

## 2015-01-18 DIAGNOSIS — M25422 Effusion, left elbow: Secondary | ICD-10-CM | POA: Diagnosis not present

## 2015-01-18 DIAGNOSIS — M25522 Pain in left elbow: Secondary | ICD-10-CM | POA: Diagnosis not present

## 2015-01-25 DIAGNOSIS — M25522 Pain in left elbow: Secondary | ICD-10-CM | POA: Diagnosis not present

## 2015-01-25 DIAGNOSIS — M25422 Effusion, left elbow: Secondary | ICD-10-CM | POA: Diagnosis not present

## 2015-01-31 DIAGNOSIS — T402X5A Adverse effect of other opioids, initial encounter: Secondary | ICD-10-CM

## 2015-01-31 DIAGNOSIS — K5903 Drug induced constipation: Secondary | ICD-10-CM | POA: Insufficient documentation

## 2015-01-31 DIAGNOSIS — K9509 Other complications of gastric band procedure: Secondary | ICD-10-CM | POA: Diagnosis not present

## 2015-01-31 DIAGNOSIS — K219 Gastro-esophageal reflux disease without esophagitis: Secondary | ICD-10-CM | POA: Diagnosis not present

## 2015-02-04 ENCOUNTER — Other Ambulatory Visit: Payer: Self-pay | Admitting: Family Medicine

## 2015-02-05 DIAGNOSIS — Z23 Encounter for immunization: Secondary | ICD-10-CM | POA: Diagnosis not present

## 2015-02-05 DIAGNOSIS — Z86010 Personal history of colonic polyps: Secondary | ICD-10-CM | POA: Diagnosis not present

## 2015-02-05 DIAGNOSIS — G4700 Insomnia, unspecified: Secondary | ICD-10-CM | POA: Diagnosis not present

## 2015-02-05 DIAGNOSIS — Z86718 Personal history of other venous thrombosis and embolism: Secondary | ICD-10-CM | POA: Diagnosis not present

## 2015-02-05 DIAGNOSIS — M179 Osteoarthritis of knee, unspecified: Secondary | ICD-10-CM | POA: Diagnosis not present

## 2015-02-05 DIAGNOSIS — K219 Gastro-esophageal reflux disease without esophagitis: Secondary | ICD-10-CM | POA: Diagnosis not present

## 2015-02-05 DIAGNOSIS — I872 Venous insufficiency (chronic) (peripheral): Secondary | ICD-10-CM | POA: Diagnosis not present

## 2015-02-05 DIAGNOSIS — Z91048 Other nonmedicinal substance allergy status: Secondary | ICD-10-CM | POA: Diagnosis not present

## 2015-02-05 DIAGNOSIS — R600 Localized edema: Secondary | ICD-10-CM | POA: Diagnosis not present

## 2015-02-05 DIAGNOSIS — F411 Generalized anxiety disorder: Secondary | ICD-10-CM | POA: Diagnosis not present

## 2015-02-05 DIAGNOSIS — Z9049 Acquired absence of other specified parts of digestive tract: Secondary | ICD-10-CM | POA: Diagnosis not present

## 2015-02-05 DIAGNOSIS — G8929 Other chronic pain: Secondary | ICD-10-CM | POA: Diagnosis not present

## 2015-02-05 DIAGNOSIS — F1120 Opioid dependence, uncomplicated: Secondary | ICD-10-CM | POA: Diagnosis not present

## 2015-02-05 DIAGNOSIS — K9509 Other complications of gastric band procedure: Secondary | ICD-10-CM | POA: Diagnosis not present

## 2015-02-05 DIAGNOSIS — J309 Allergic rhinitis, unspecified: Secondary | ICD-10-CM | POA: Diagnosis not present

## 2015-02-05 DIAGNOSIS — F329 Major depressive disorder, single episode, unspecified: Secondary | ICD-10-CM | POA: Diagnosis not present

## 2015-02-05 DIAGNOSIS — Z79899 Other long term (current) drug therapy: Secondary | ICD-10-CM | POA: Diagnosis not present

## 2015-02-05 DIAGNOSIS — Z4651 Encounter for fitting and adjustment of gastric lap band: Secondary | ICD-10-CM | POA: Diagnosis not present

## 2015-02-05 DIAGNOSIS — Z880 Allergy status to penicillin: Secondary | ICD-10-CM | POA: Diagnosis not present

## 2015-02-05 DIAGNOSIS — F419 Anxiety disorder, unspecified: Secondary | ICD-10-CM | POA: Diagnosis not present

## 2015-02-05 DIAGNOSIS — K5900 Constipation, unspecified: Secondary | ICD-10-CM | POA: Diagnosis not present

## 2015-02-05 DIAGNOSIS — F410 Panic disorder [episodic paroxysmal anxiety] without agoraphobia: Secondary | ICD-10-CM | POA: Diagnosis not present

## 2015-02-05 DIAGNOSIS — E785 Hyperlipidemia, unspecified: Secondary | ICD-10-CM | POA: Diagnosis not present

## 2015-02-06 DIAGNOSIS — Z79899 Other long term (current) drug therapy: Secondary | ICD-10-CM | POA: Diagnosis not present

## 2015-02-06 DIAGNOSIS — F411 Generalized anxiety disorder: Secondary | ICD-10-CM | POA: Diagnosis not present

## 2015-02-06 DIAGNOSIS — E785 Hyperlipidemia, unspecified: Secondary | ICD-10-CM | POA: Diagnosis not present

## 2015-02-06 DIAGNOSIS — I872 Venous insufficiency (chronic) (peripheral): Secondary | ICD-10-CM | POA: Diagnosis not present

## 2015-02-06 DIAGNOSIS — Z86010 Personal history of colonic polyps: Secondary | ICD-10-CM | POA: Diagnosis not present

## 2015-02-06 DIAGNOSIS — Z9049 Acquired absence of other specified parts of digestive tract: Secondary | ICD-10-CM | POA: Diagnosis not present

## 2015-02-06 DIAGNOSIS — J309 Allergic rhinitis, unspecified: Secondary | ICD-10-CM | POA: Diagnosis not present

## 2015-02-06 DIAGNOSIS — R600 Localized edema: Secondary | ICD-10-CM | POA: Diagnosis not present

## 2015-02-06 DIAGNOSIS — M179 Osteoarthritis of knee, unspecified: Secondary | ICD-10-CM | POA: Diagnosis not present

## 2015-02-06 DIAGNOSIS — G4700 Insomnia, unspecified: Secondary | ICD-10-CM | POA: Diagnosis not present

## 2015-02-06 DIAGNOSIS — Z880 Allergy status to penicillin: Secondary | ICD-10-CM | POA: Diagnosis not present

## 2015-02-06 DIAGNOSIS — K9509 Other complications of gastric band procedure: Secondary | ICD-10-CM | POA: Diagnosis not present

## 2015-02-06 DIAGNOSIS — F1120 Opioid dependence, uncomplicated: Secondary | ICD-10-CM | POA: Diagnosis not present

## 2015-02-06 DIAGNOSIS — F410 Panic disorder [episodic paroxysmal anxiety] without agoraphobia: Secondary | ICD-10-CM | POA: Diagnosis not present

## 2015-02-06 DIAGNOSIS — Z86718 Personal history of other venous thrombosis and embolism: Secondary | ICD-10-CM | POA: Diagnosis not present

## 2015-02-06 DIAGNOSIS — K5900 Constipation, unspecified: Secondary | ICD-10-CM | POA: Diagnosis not present

## 2015-02-06 DIAGNOSIS — F329 Major depressive disorder, single episode, unspecified: Secondary | ICD-10-CM | POA: Diagnosis not present

## 2015-02-06 DIAGNOSIS — K219 Gastro-esophageal reflux disease without esophagitis: Secondary | ICD-10-CM | POA: Diagnosis not present

## 2015-02-06 DIAGNOSIS — Z91048 Other nonmedicinal substance allergy status: Secondary | ICD-10-CM | POA: Diagnosis not present

## 2015-02-06 DIAGNOSIS — F419 Anxiety disorder, unspecified: Secondary | ICD-10-CM | POA: Diagnosis not present

## 2015-02-06 DIAGNOSIS — G8929 Other chronic pain: Secondary | ICD-10-CM | POA: Diagnosis not present

## 2015-02-06 DIAGNOSIS — Z23 Encounter for immunization: Secondary | ICD-10-CM | POA: Diagnosis not present

## 2015-02-07 ENCOUNTER — Telehealth: Payer: Self-pay | Admitting: Family Medicine

## 2015-02-07 NOTE — Telephone Encounter (Signed)
Refills sent yesterday 

## 2015-02-11 ENCOUNTER — Ambulatory Visit (INDEPENDENT_AMBULATORY_CARE_PROVIDER_SITE_OTHER): Payer: Medicare Other | Admitting: Family Medicine

## 2015-02-11 ENCOUNTER — Encounter: Payer: Self-pay | Admitting: Family Medicine

## 2015-02-11 VITALS — BP 114/83 | HR 106 | Wt 214.0 lb

## 2015-02-11 DIAGNOSIS — F411 Generalized anxiety disorder: Secondary | ICD-10-CM | POA: Diagnosis not present

## 2015-02-11 DIAGNOSIS — L309 Dermatitis, unspecified: Secondary | ICD-10-CM | POA: Diagnosis not present

## 2015-02-11 MED ORDER — VENLAFAXINE HCL ER 150 MG PO CP24: 150.0000 mg | ORAL_CAPSULE | Freq: Every day | ORAL | Status: DC

## 2015-02-11 MED ORDER — TRIAMCINOLONE ACETONIDE 0.1 % EX CREA: TOPICAL_CREAM | CUTANEOUS | Status: DC

## 2015-02-11 MED ORDER — VENLAFAXINE HCL ER 75 MG PO CP24: 75.0000 mg | ORAL_CAPSULE | Freq: Every day | ORAL | Status: DC

## 2015-02-11 NOTE — Progress Notes (Signed)
CC: Katrina Fernandez is a 49 y.o. female is here for Depression   Subjective: HPI:  Last week her gastric band was removed via laparoscopy. Over the weekend she's developed a itchy red raised rash surrounding the ports of her laparoscopy. Itches severe in severity. It is painless. Denies any discharge or drainage from the port sites. Rash seems to be spreading. Denies fevers, chills, nor skin changes elsewhere. Denies swollen lymph nodes.  Complains of irritability and short fuse and feeling stressed out over the past month. Symptoms are on a daily basis. Symptoms are worse as the day proceeds. Symptoms are worsened by her son blatantly lying and being manipulated. She denies any depression. She would like to know if there is another dose of Effexor that she could use possibly higher to help with this. No thoughts of wanting to harm self or others   Review Of Systems Outlined In HPI  Past Medical History  Diagnosis Date  . Hyperlipidemia   . Depression   . GERD (gastroesophageal reflux disease)   . History of DVT of lower extremity     LEFT LEG --  3/ 2014  . Arthritis   . Left elbow pain     LEFT EXTENSOR TEAR  . PONV (postoperative nausea and vomiting)   . Chronic constipation   . Chronic pain syndrome     chronic methodone  . Lactose intolerance   . Lower extremity edema     Past Surgical History  Procedure Laterality Date  . Carpal tunnel release Bilateral 2001  . Cholecystectomy    . Laparoscopic gastric banding  2011  . Breast reduction surgery Bilateral 06/07/2013    Procedure: BILATERAL MAMMARY REDUCTION  (BREAST);  Surgeon: Theodoro Kos, DO;  Location: [place];  Service: Plastics;  Laterality: Bilateral;  . Breast reduction surgery Bilateral 06/07/2013    Procedure:  LIPOSUCTION;  Surgeon: Theodoro Kos, DO;  Location: [place];  Service: Plastics;  Laterality: Bilateral;  . Posterior cervical laminectomy  03-01-2001    C7-8 and  Exploration C8 perineural cyst  . Placement lumboperitoneal shunt for cerebrospinal fluid diversion away from c8 perineural cyst  09-05-2003  &  12-24-2003    08-31-2003  REMOVAL SHUNT  . Posterior lumbar laminectomy l4-5/  diskectomy l5-s1  05-20-2005  . Re-do decompression laminectomy and fusion l4-5  &  l5-s1  12-20-2006    02-04-2007--  RE-EXPLORATION L4 to S1, I & D LUMBAR WOUND HEMATOMA  . Anterior cervical decomp/discectomy fusion  07-25-2009    C5 -- C6  . Appendectomy  age 89  . Total abdominal hysterectomy  1994    w/  Bilateral Salpinoophorectomy  . Negative sleep study  2010  per pt  . Tennis elbow release/nirschel procedure Left 12/13/2014    Procedure: LEFT ELBOW LATERAL EPICONDYLAR DEBRIDEMENT AND OR REPAIR -RECONSTRUCTION ;  Surgeon: Iran Planas, MD;  Location: [place];  Service: Orthopedics;  Laterality: Left;   Family History  Problem Relation Age of Onset  . Depression    . Colon cancer Father   . Ovarian cancer Maternal Grandmother   . Pancreatic cancer Maternal Grandfather     Social History   Social History  . Marital Status: Married    Spouse Name: N/A  . Number of Children: N/A  . Years of Education: N/A   Occupational History  . Not on file.   Social History Main Topics  . Smoking status: Never Smoker   . Smokeless tobacco:  Never Used  . Alcohol Use: No  . Drug Use: No  . Sexual Activity: Not on file   Other Topics Concern  . Not on file   Social History Narrative     Objective: BP 114/83 mmHg  Pulse 106  Wt 214 lb (97.07 kg)  Vital signs reviewed. General: Alert and Oriented, No Acute Distress HEENT: Pupils equal, round, reactive to light. Conjunctivae clear.  External ears unremarkable.  Moist mucous membranes. Lungs: Clear and comfortable work of breathing, speaking in full sentences without accessory muscle use. Cardiac: Regular rate and rhythm.  Neuro: CN II-XII grossly intact, gait normal. Extremities: No  peripheral edema.  Strong peripheral pulses.  Mental Status: No depression, anxiety, nor agitation. Logical though process. Skin: Warm and dry. Port sites are clean dry and intact without signs of infection however there is speckled patches of erythema surrounding each of the port sites.  Assessment & Plan: [name] was seen today for depression.  Diagnoses and all orders for this visit:  Dermatitis -     triamcinolone cream (KENALOG) 0.1 %; Apply to affected areas twice a day for up to two weeks, avoid face.  Generalized anxiety disorder -     venlafaxine XR (EFFEXOR-XR) 75 MG 24 hr capsule; Take 1 capsule (75 mg total) by mouth daily with breakfast. -     venlafaxine XR (EFFEXOR XR) 150 MG 24 hr capsule; Take 1 capsule (150 mg total) by mouth daily with breakfast.   Allergic dermatitis due to some material used to approximate the port sites. Advise not to remove any of the adhesive keeping her port sites approximated and was told so by her surgeon. Use triamcinolone cream around the periphery of these ports where the allergic rash is. Discussed importance of avoiding contact of triamcinolone cream to the actual healing wounds. Generalized anxiety disorder: Uncontrolled chronic condition increasing Effexor.  Return in about 4 weeks (around 03/11/2015) for mood.

## 2015-02-12 DIAGNOSIS — R51 Headache: Secondary | ICD-10-CM | POA: Diagnosis not present

## 2015-02-12 DIAGNOSIS — T849XXS Unspecified complication of internal orthopedic prosthetic device, implant and graft, sequela: Secondary | ICD-10-CM | POA: Diagnosis not present

## 2015-02-12 DIAGNOSIS — Z79899 Other long term (current) drug therapy: Secondary | ICD-10-CM | POA: Diagnosis not present

## 2015-02-12 DIAGNOSIS — M5440 Lumbago with sciatica, unspecified side: Secondary | ICD-10-CM | POA: Diagnosis not present

## 2015-02-12 DIAGNOSIS — M961 Postlaminectomy syndrome, not elsewhere classified: Secondary | ICD-10-CM | POA: Diagnosis not present

## 2015-02-12 DIAGNOSIS — Z5181 Encounter for therapeutic drug level monitoring: Secondary | ICD-10-CM | POA: Diagnosis not present

## 2015-02-23 ENCOUNTER — Encounter: Payer: Self-pay | Admitting: Family Medicine

## 2015-03-07 ENCOUNTER — Encounter: Payer: Self-pay | Admitting: Family Medicine

## 2015-03-07 DIAGNOSIS — F411 Generalized anxiety disorder: Secondary | ICD-10-CM

## 2015-03-08 MED ORDER — VENLAFAXINE HCL ER 75 MG PO CP24: 75.0000 mg | ORAL_CAPSULE | Freq: Every day | ORAL | Status: DC

## 2015-03-08 MED ORDER — VENLAFAXINE HCL ER 150 MG PO CP24: 150.0000 mg | ORAL_CAPSULE | Freq: Every day | ORAL | Status: DC

## 2015-03-13 ENCOUNTER — Encounter: Payer: Self-pay | Admitting: Family Medicine

## 2015-03-13 ENCOUNTER — Ambulatory Visit (INDEPENDENT_AMBULATORY_CARE_PROVIDER_SITE_OTHER): Payer: Medicare Other | Admitting: Family Medicine

## 2015-03-13 VITALS — BP 120/86 | HR 96 | Wt 210.0 lb

## 2015-03-13 DIAGNOSIS — F411 Generalized anxiety disorder: Secondary | ICD-10-CM

## 2015-03-13 DIAGNOSIS — F329 Major depressive disorder, single episode, unspecified: Secondary | ICD-10-CM | POA: Diagnosis not present

## 2015-03-13 DIAGNOSIS — F32A Depression, unspecified: Secondary | ICD-10-CM

## 2015-03-13 DIAGNOSIS — R635 Abnormal weight gain: Secondary | ICD-10-CM

## 2015-03-13 DIAGNOSIS — L820 Inflamed seborrheic keratosis: Secondary | ICD-10-CM | POA: Diagnosis not present

## 2015-03-13 MED ORDER — PHENTERMINE HCL 37.5 MG PO TABS
37.5000 mg | ORAL_TABLET | Freq: Every day | ORAL | Status: DC
Start: 2015-03-13 — End: 2015-04-19

## 2015-03-13 NOTE — Progress Notes (Signed)
CC: Katrina Fernandez is a 49 y.o. female is here for Anxiety; Depression; and Abnormal Weight Gain   Subjective: HPI:  Follow-up depression: Since increasing her Effexor she believes that her depression might actually be improving. She is not as irritable towards others and not as anxious during the day. She is looking forward to old hobbies and motherly duties with her adopted daughter. Denies thoughts of wanting to harm self or others.  She's been able to lose 20 pounds over the summer. She is having difficulty with portion control. She is trying to stay active on a daily basis. She tells me she was once on phentermine and wonders if something like this could be tried to help with weight loss. Her subjective ideal weight is 135 pounds.  She has a spot on her back that's been present for matter of years and itches on a daily basis. It's driving her crazy and significantly interfere quality of life. Nothing seems to make it better but it is worse the more friction she has on the growth. She does not believe it getting larger or smaller   Review Of Systems Outlined In HPI  Past Medical History  Diagnosis Date  . Hyperlipidemia   . Depression   . GERD (gastroesophageal reflux disease)   . History of DVT of lower extremity     LEFT LEG --  3/ 2014  . Arthritis   . Left elbow pain     LEFT EXTENSOR TEAR  . PONV (postoperative nausea and vomiting)   . Chronic constipation   . Chronic pain syndrome     chronic methodone  . Lactose intolerance   . Lower extremity edema     Past Surgical History  Procedure Laterality Date  . Carpal tunnel release Bilateral 2001  . Cholecystectomy    . Laparoscopic gastric banding  2011  . Breast reduction surgery Bilateral 06/07/2013    Procedure: BILATERAL MAMMARY REDUCTION  (BREAST);  Surgeon: Theodoro Kos, DO;  Location: [place];  Service: Plastics;  Laterality: Bilateral;  . Breast reduction surgery Bilateral 06/07/2013   Procedure:  LIPOSUCTION;  Surgeon: Theodoro Kos, DO;  Location: [place];  Service: Plastics;  Laterality: Bilateral;  . Posterior cervical laminectomy  03-01-2001    C7-8 and Exploration C8 perineural cyst  . Placement lumboperitoneal shunt for cerebrospinal fluid diversion away from c8 perineural cyst  09-05-2003  &  12-24-2003    08-31-2003  REMOVAL SHUNT  . Posterior lumbar laminectomy l4-5/  diskectomy l5-s1  05-20-2005  . Re-do decompression laminectomy and fusion l4-5  &  l5-s1  12-20-2006    02-04-2007--  RE-EXPLORATION L4 to S1, I & D LUMBAR WOUND HEMATOMA  . Anterior cervical decomp/discectomy fusion  07-25-2009    C5 -- C6  . Appendectomy  age 72  . Total abdominal hysterectomy  1994    w/  Bilateral Salpinoophorectomy  . Negative sleep study  2010  per pt  . Tennis elbow release/nirschel procedure Left 12/13/2014    Procedure: LEFT ELBOW LATERAL EPICONDYLAR DEBRIDEMENT AND OR REPAIR -RECONSTRUCTION ;  Surgeon: Iran Planas, MD;  Location: [place];  Service: Orthopedics;  Laterality: Left;   Family History  Problem Relation Age of Onset  . Depression    . Colon cancer Father   . Ovarian cancer Maternal Grandmother   . Pancreatic cancer Maternal Grandfather     Social History   Social History  . Marital Status: Married    Spouse Name:  N/A  . Number of Children: N/A  . Years of Education: N/A   Occupational History  . Not on file.   Social History Main Topics  . Smoking status: Never Smoker   . Smokeless tobacco: Never Used  . Alcohol Use: No  . Drug Use: No  . Sexual Activity: Not on file   Other Topics Concern  . Not on file   Social History Narrative     Objective: BP 120/86 mmHg  Pulse 96  Wt 210 lb (95.255 kg)  Vital signs reviewed. General: Alert and Oriented, No Acute Distress HEENT: Pupils equal, round, reactive to light. Conjunctivae clear.  External ears unremarkable.  Moist mucous membranes. Lungs: Clear  and comfortable work of breathing, speaking in full sentences without accessory muscle use. Cardiac: Regular rate and rhythm.  Neuro: CN II-XII grossly intact, gait normal. Abdomen: Soft, obese Extremities: No peripheral edema.  Strong peripheral pulses.  Mental Status: No depression, anxiety, nor agitation. Logical though process. Skin: Warm and dry. Half centimeter diameter stuck on fleshy and waxy appearing lesion on the back without vascularity, just right of the thoracolumbar junction  Assessment & Plan: Katrina Fernandez was seen today for anxiety, depression and abnormal weight gain.  Diagnoses and all orders for this visit:  Depression  Generalized anxiety disorder  Abnormal weight gain  Seborrheic keratoses, inflamed  Other orders -     phentermine (ADIPEX-P) 37.5 MG tablet; Take 1 tablet (37.5 mg total) by mouth daily before breakfast.   Depression and anxiety: Improving continue max dose Effexor Abnormal weight gain: Discussed starting on phentermine with weight checks every month. She understands that this medication could cause anxiety and will eventually become ineffective at losing weight. At that time she's welcome to consider Qsymia, Belviq, Saxenda, or Contrave Seborrheic keratosis: Given persistent pain and itching she would like cryotherapy for destruction today  Cryotherapy Procedure Note  Pre-operative Diagnosis: Inflammed Seb keratosis  Post-operative Diagnosis: same  Locations: right low back  Indications: pain and itching  Anesthesia: none  Procedure Details  History of allergy to iodine: no. Pacemaker? no.  Patient informed of risks (permanent scarring, infection, light or dark discoloration, bleeding, infection, weakness, numbness and recurrence of the lesion) and benefits of the procedure and verbal informed consent obtained.  The areas are treated with liquid nitrogen therapy, frozen until ice ball extended 2 mm beyond lesion, allowed to thaw, and  treated again. The patient tolerated procedure well.  The patient was instructed on post-op care, warned that there may be blister formation, redness and pain. Recommend OTC analgesia as needed for pain.  Condition: Stable  Complications: none.  Plan: 1. Instructed to keep the area dry and covered for 24-48h and clean thereafter. 2. Warning signs of infection were reviewed.   3. Recommended that the patient use OTC ibuprofen as needed for pain.  4. Return PRN   Return in about 4 weeks (around 04/10/2015) for Nurse Weight Check.

## 2015-03-14 ENCOUNTER — Encounter: Payer: Self-pay | Admitting: Family Medicine

## 2015-03-14 DIAGNOSIS — M961 Postlaminectomy syndrome, not elsewhere classified: Secondary | ICD-10-CM | POA: Diagnosis not present

## 2015-03-14 DIAGNOSIS — M5137 Other intervertebral disc degeneration, lumbosacral region: Secondary | ICD-10-CM | POA: Diagnosis not present

## 2015-03-14 DIAGNOSIS — Z982 Presence of cerebrospinal fluid drainage device: Secondary | ICD-10-CM | POA: Diagnosis not present

## 2015-03-21 DIAGNOSIS — M5137 Other intervertebral disc degeneration, lumbosacral region: Secondary | ICD-10-CM | POA: Diagnosis not present

## 2015-03-21 DIAGNOSIS — Z6838 Body mass index (BMI) 38.0-38.9, adult: Secondary | ICD-10-CM | POA: Diagnosis not present

## 2015-03-21 DIAGNOSIS — M961 Postlaminectomy syndrome, not elsewhere classified: Secondary | ICD-10-CM | POA: Diagnosis not present

## 2015-03-26 DIAGNOSIS — Z01419 Encounter for gynecological examination (general) (routine) without abnormal findings: Secondary | ICD-10-CM | POA: Diagnosis not present

## 2015-03-27 ENCOUNTER — Other Ambulatory Visit: Payer: Self-pay | Admitting: Anesthesiology

## 2015-04-07 ENCOUNTER — Other Ambulatory Visit: Payer: Self-pay | Admitting: Family Medicine

## 2015-04-09 DIAGNOSIS — M961 Postlaminectomy syndrome, not elsewhere classified: Secondary | ICD-10-CM | POA: Diagnosis not present

## 2015-04-09 DIAGNOSIS — M79604 Pain in right leg: Secondary | ICD-10-CM | POA: Diagnosis not present

## 2015-04-09 DIAGNOSIS — T849XXS Unspecified complication of internal orthopedic prosthetic device, implant and graft, sequela: Secondary | ICD-10-CM | POA: Diagnosis not present

## 2015-04-09 DIAGNOSIS — M5440 Lumbago with sciatica, unspecified side: Secondary | ICD-10-CM | POA: Diagnosis not present

## 2015-04-10 ENCOUNTER — Ambulatory Visit (INDEPENDENT_AMBULATORY_CARE_PROVIDER_SITE_OTHER): Payer: Medicare Other | Admitting: Family Medicine

## 2015-04-10 VITALS — BP 116/78 | HR 100 | Wt 213.0 lb

## 2015-04-10 DIAGNOSIS — R1013 Epigastric pain: Secondary | ICD-10-CM | POA: Diagnosis not present

## 2015-04-10 DIAGNOSIS — K219 Gastro-esophageal reflux disease without esophagitis: Secondary | ICD-10-CM | POA: Diagnosis not present

## 2015-04-10 DIAGNOSIS — R635 Abnormal weight gain: Secondary | ICD-10-CM | POA: Diagnosis not present

## 2015-04-10 DIAGNOSIS — R110 Nausea: Secondary | ICD-10-CM | POA: Diagnosis not present

## 2015-04-10 DIAGNOSIS — K5900 Constipation, unspecified: Secondary | ICD-10-CM | POA: Diagnosis not present

## 2015-04-10 NOTE — Progress Notes (Signed)
   Subjective:    Patient ID: Katrina Fernandez, female    DOB: 09/03/1965, 49 y.o.   MRN: [id number]  HPI  Katrina Fernandez is here for a weight check and blood pressure check. She states the phentermine is not helping with appetite. She wants Dr Hommel's recommendations on the next step.    Review of Systems     Objective:   Physical Exam        Assessment & Plan:  Abnormal Weight Gain - Patient has gain weight and a refill will not be sent to pharmacy.

## 2015-04-19 ENCOUNTER — Ambulatory Visit: Payer: Medicare Other | Admitting: Sports Medicine

## 2015-04-22 ENCOUNTER — Encounter (HOSPITAL_COMMUNITY)
Admission: RE | Admit: 2015-04-22 | Discharge: 2015-04-22 | Disposition: A | Discharge status: Home / Self Care | Payer: Medicare Other | Source: Ambulatory Visit | Attending: Anesthesiology | Admitting: Anesthesiology

## 2015-04-22 ENCOUNTER — Encounter (HOSPITAL_COMMUNITY): Payer: Self-pay

## 2015-04-22 DIAGNOSIS — M961 Postlaminectomy syndrome, not elsewhere classified: Secondary | ICD-10-CM | POA: Insufficient documentation

## 2015-04-22 DIAGNOSIS — Z79899 Other long term (current) drug therapy: Secondary | ICD-10-CM | POA: Insufficient documentation

## 2015-04-22 DIAGNOSIS — Z01812 Encounter for preprocedural laboratory examination: Secondary | ICD-10-CM | POA: Insufficient documentation

## 2015-04-22 DIAGNOSIS — Z01818 Encounter for other preprocedural examination: Secondary | ICD-10-CM | POA: Insufficient documentation

## 2015-04-22 DIAGNOSIS — E785 Hyperlipidemia, unspecified: Secondary | ICD-10-CM | POA: Insufficient documentation

## 2015-04-22 DIAGNOSIS — K219 Gastro-esophageal reflux disease without esophagitis: Secondary | ICD-10-CM | POA: Diagnosis not present

## 2015-04-22 DIAGNOSIS — G43909 Migraine, unspecified, not intractable, without status migrainosus: Secondary | ICD-10-CM | POA: Diagnosis not present

## 2015-04-22 DIAGNOSIS — Z86718 Personal history of other venous thrombosis and embolism: Secondary | ICD-10-CM | POA: Insufficient documentation

## 2015-04-22 HISTORY — DX: Dorsalgia, unspecified: M54.9

## 2015-04-22 HISTORY — DX: Weakness: R53.1

## 2015-04-22 HISTORY — DX: Other seasonal allergic rhinitis: J30.2

## 2015-04-22 HISTORY — DX: Edema, unspecified: R60.9

## 2015-04-22 HISTORY — DX: Personal history of urinary calculi: Z87.442

## 2015-04-22 HISTORY — DX: Other muscle spasm: M62.838

## 2015-04-22 HISTORY — DX: Pneumonia, unspecified organism: J18.9

## 2015-04-22 HISTORY — DX: Other chronic pain: G89.29

## 2015-04-22 HISTORY — DX: Polyneuropathy, unspecified: G62.9

## 2015-04-22 HISTORY — DX: Localized edema: R60.0

## 2015-04-22 HISTORY — DX: Personal history of Methicillin resistant Staphylococcus aureus infection: Z86.14

## 2015-04-22 LAB — SURGICAL PCR SCREEN
MRSA, PCR: NEGATIVE
Staphylococcus aureus: NEGATIVE

## 2015-04-22 LAB — CBC
HCT: 40.3 % (ref 36.0–46.0)
Hemoglobin: 13 g/dL (ref 12.0–15.0)
MCH: 27.7 pg (ref 26.0–34.0)
MCHC: 32.3 g/dL (ref 30.0–36.0)
MCV: 85.7 fL (ref 78.0–100.0)
Platelets: 271 10*3/uL (ref 150–400)
RBC: 4.7 MIL/uL (ref 3.87–5.11)
RDW: 13.9 % (ref 11.5–15.5)
WBC: 7.7 10*3/uL (ref 4.0–10.5)

## 2015-04-22 LAB — BASIC METABOLIC PANEL
Anion gap: 10 (ref 5–15)
BUN: 12 mg/dL (ref 6–20)
CO2: 31 mmol/L (ref 22–32)
Calcium: 9.6 mg/dL (ref 8.9–10.3)
Chloride: 100 mmol/L — ABNORMAL LOW (ref 101–111)
Creatinine, Ser: 0.71 mg/dL (ref 0.44–1.00)
GFR calc Af Amer: >60 mL/min (ref >60)
GFR calc non Af Amer: >60 mL/min (ref >60)
Glucose, Bld: 113 mg/dL — ABNORMAL HIGH (ref 65–99)
Potassium: 3.5 mmol/L (ref 3.5–5.1)
Sodium: 141 mmol/L (ref 135–145)

## 2015-04-22 NOTE — Pre-Procedure Instructions (Signed)
Tanya Syler Buice  04/22/2015      [id number] - [employer], [place] - [address] [place] [address] Phone: [phone] Fax: [phone]    Your procedure is scheduled on Fri, Dec 9 @ 7:30 AM  Report to [facility] Admitting at 5:30 AM  Call this number if you have problems the morning of surgery:  [phone]   Remember:  Do not eat food or drink liquids after midnight.  Take these medicines the morning of surgery with A SIP OF WATER Keppra(Levetiracetam),Claritin(Loratadine),Pain Pill(if needed),and Effexor(Venlafaxine)             No Goody's,BC's,Aleve,Aspirin,Ibuprofen,Advil,Motrin,Fish Oil,or any Herbal Medications.    Do not wear jewelry, make-up or nail polish.  Do not wear lotions, powders, or perfumes.  You may wear deodorant.  Do not shave 48 hours prior to surgery.    Do not bring valuables to the hospital.  [facility] is not responsible for any belongings or valuables.  Contacts, dentures or bridgework may not be worn into surgery.  Leave your suitcase in the car.  After surgery it may be brought to your room.  For patients admitted to the hospital, discharge time will be determined by your treatment team.  Patients discharged the day of surgery will not be allowed to drive home.    Special instructions:  [facility] - Preparing for Surgery  Before surgery, you can play an important role.  Because skin is not sterile, your skin needs to be as free of germs as possible.  You can reduce the number of germs on you skin by washing with CHG (chlorahexidine gluconate) soap before surgery.  CHG is an antiseptic cleaner which kills germs and bonds with the skin to continue killing germs even after washing.  Please DO NOT use if you have an allergy to CHG or antibacterial soaps.  If your skin becomes reddened/irritated stop using the CHG and inform your nurse when you arrive at Short Stay.  Do not shave (including legs and  underarms) for at least 48 hours prior to the first CHG shower.  You may shave your face.  Please follow these instructions carefully:   1.  Shower with CHG Soap the night before surgery and the                                morning of Surgery.  2.  If you choose to wash your hair, wash your hair first as usual with your       normal shampoo.  3.  After you shampoo, rinse your hair and body thoroughly to remove the                      Shampoo.  4.  Use CHG as you would any other liquid soap.  You can apply chg directly       to the skin and wash gently with scrungie or a clean washcloth.  5.  Apply the CHG Soap to your body ONLY FROM THE NECK DOWN.        Do not use on open wounds or open sores.  Avoid contact with your eyes,       ears, mouth and genitals (private parts).  Wash genitals (private parts)       with your normal soap.  6.  Wash thoroughly, paying special attention to  the area where your surgery        will be performed.  7.  Thoroughly rinse your body with warm water from the neck down.  8.  DO NOT shower/wash with your normal soap after using and rinsing off       the CHG Soap.  9.  Pat yourself dry with a clean towel.            10.  Wear clean pajamas.            11.  Place clean sheets on your bed the night of your first shower and do not        sleep with pets.  Day of Surgery  Do not apply any lotions/deoderants the morning of surgery.  Please wear clean clothes to the hospital/surgery center.    Please read over the following fact sheets that you were given. Pain Booklet, Coughing and Deep Breathing, MRSA Information and Surgical Site Infection Prevention

## 2015-04-22 NOTE — Progress Notes (Signed)
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

## 2015-04-23 NOTE — Progress Notes (Signed)
Anesthesia Chart Review:  Pt is 49 year old female scheduled for lumbar spinal cord stimulator insertion on 04/26/2015 with Dr. Maryjean Ka.   PMH includes:  DVT (2014), hyperlipidemia, migraine, GERD, post-op N/V. Never smoker. BMI 37. S/p L elbow lateral epicondyle debridement 12/13/14. S/p B breast reduction 06/07/13.   Medications include: lasix, keppra, simvastatin.   Preoperative labs reviewed.    EKG: NSR. Possible anterior infarct, age undetermined. QT prolonged. Poor anterior R wave progression. Nonspecific ST-T changes. No significant change since 06/07/13 EKG.   If no changes, I anticipate pt can proceed with surgery as scheduled.   Willeen Cass, FNP-BC [facility] Short Stay Surgical Center/Anesthesiology Phone: [phone] 04/23/2015 1:16 PM

## 2015-04-25 MED ORDER — VANCOMYCIN HCL IN DEXTROSE 1-5 GM/200ML-% IV SOLN
1000.0000 mg | INTRAVENOUS | Status: AC
  Administered 2015-04-26: 1000 mg via INTRAVENOUS
  Filled 2015-04-25: qty 200

## 2015-04-25 NOTE — H&P (Signed)
Katrina Fernandez is an 49 y.o. female.   Chief Complaint: back pain HPI: Katrina Fernandez has a long-standing history of migraines, which have been relieved by placement of a lumboperitoneal shunt by Katrina Fernandez in 2005.  She also has a long-standing history of low back pain, right-sided radiculopathy, related to disc protrusions at L4 L5 and L5-S1;  she underwent discectomy and then conservative therapy for several years, but when those measures ultimately failed in 2008, she went on to L4 L5 L5-S1 decompression and fusion with Katrina Fernandez.  Although she had some degree of improvement, she overall continues to experience low back pain, with radicular symptoms into the right leg.  She is continued to have pain management care, initially with Katrina Fernandez, and then over the last several years with Katrina Fernandez.  She has trialed a variety of interventions, any number of different medications, but still reports a pain level that is obstructive and debilitating.  She still has children to care for.  Although I don't have notes, I have spoken with Katrina Fernandez, and the patient today also informs me that earlier this year Katrina Fernandez planned to trial spinal cord stimulator therapy.  The patient apparently had a psychological evaluation, was found to be a good candidate from that standpoint.  Unfortunately it sounds like there were some kinds of technical difficulties that precluded actually placing her trial lead.  She essentially had an aborted attempt at a trial.  She is very interested in pursuing this modality of therapy, as she expresses that she would like to decrease or completely eliminate her medications.  On 03/14/15, we were able to place SCS trial leads from a left paramedian approach entering at the T12-L1 interspace and placing leads with 3 contacts across the inferior aspect of the T7 vertebral body projection, with the proximal contacts overlying T8. Patient hada very successful trial for the ensuing week,  reporting 75-80% reduction in her overall back pain, and right leg pain particularly.   Past Medical History  Diagnosis Date  . Hyperlipidemia     takes Simvastatin daily  . History of DVT of lower extremity     LEFT LEG --  3/ 2014.Was on a blood transfusion for 6 months  . PONV (postoperative nausea and vomiting)   . Chronic constipation     takes Miralax daily  . Chronic pain syndrome     takes Methadone and Keppra daily  . Depression     takes Effexor daily  . Peripheral edema     takes Lasix daily as needed  . Muscle spasm     takes Zanaflex nightly  . Seasonal allergies     takes Claritin daily  . Pneumonia     hx of-7-8 yrs ago  . History of bronchitis 10+ yrs ago  . History of migraine     last one a month ago  . Weakness     and numbness in right hand  . Trigger finger     right  . Peripheral neuropathy ([place])   . Arthritis     back and knees  . Chronic back pain     buldging,DDD  . GERD (gastroesophageal reflux disease)     not taking anything at the present time  . History of colon polyps     benign  . History of kidney stones   . History of MRSA infection 10 yrs ago    Past Surgical History  Procedure Laterality Date  . Carpal tunnel  release Bilateral 2001  . Cholecystectomy    . Laparoscopic gastric banding  2011  . Breast reduction surgery Bilateral 06/07/2013    Procedure: BILATERAL MAMMARY REDUCTION  (BREAST);  Surgeon: Theodoro Kos, DO;  Location: [facility];  Service: Plastics;  Laterality: Bilateral;  . Breast reduction surgery Bilateral 06/07/2013    Procedure:  LIPOSUCTION;  Surgeon: Theodoro Kos, DO;  Location: [facility];  Service: Plastics;  Laterality: Bilateral;  . Posterior cervical laminectomy  03-01-2001    C7-8 and Exploration C8 perineural cyst  . Placement lumboperitoneal shunt for cerebrospinal fluid diversion away from c8 perineural cyst  09-05-2003  &  12-24-2003    08-31-2003  REMOVAL SHUNT  .  Posterior lumbar laminectomy l4-5/  diskectomy l5-s1  05-20-2005  . Re-do decompression laminectomy and fusion l4-5  &  l5-s1  12-20-2006    02-04-2007--  RE-EXPLORATION L4 to S1, I & D LUMBAR WOUND HEMATOMA  . Anterior cervical decomp/discectomy fusion  07-25-2009    C5 -- C6  . Appendectomy  age 79  . Total abdominal hysterectomy  1994    w/  Bilateral Salpinoophorectomy  . Tennis elbow release/nirschel procedure Left 12/13/2014    Procedure: LEFT ELBOW LATERAL EPICONDYLAR DEBRIDEMENT AND OR REPAIR -RECONSTRUCTION ;  Surgeon: Iran Planas, MD;  Location: [facility];  Service: Orthopedics;  Laterality: Left;  . Gastric banding undone  11/2014  . Colonoscopy    . Esophagogastroduodenoscopy      Family History  Problem Relation Age of Onset  . Depression    . Colon cancer Father   . Ovarian cancer Maternal Grandmother   . Pancreatic cancer Maternal Grandfather    Social History:  reports that she has never smoked. She has never used smokeless tobacco. She reports that she does not drink alcohol or use illicit drugs.  Allergies:  Allergies  Allergen Reactions  . Penicillins Anaphylaxis  . Lactose Rash and Swelling  . Other Swelling and Rash    Silicone Tape  . Tape Rash    Plastic tape  . Chlorhexidine     Dermatitis - Allergic    Medications Prior to Admission  Medication Sig Dispense Refill  . furosemide (LASIX) 20 MG tablet TAKE 2 TABS BY EVERY MORNING AND 1 TAB EVERY AFTERNOON,ONLY AS NEEDED FOR LEG SWELLING (Patient taking differently: TAKE 1 TAB BY EVERY MORNING , FOR LEG SWELLING) 90 tablet 1  . levETIRAcetam (KEPPRA) 500 MG tablet Take 500 mg by mouth 2 (two) times daily.     Marland Kitchen loratadine (CLARITIN) 10 MG tablet Take 1 tablet (10 mg total) by mouth daily. 90 tablet 2  . methadone (DOLOPHINE) 10 MG tablet Take 10 mg by mouth every 8 (eight) hours.     . Polyethylene Glycol 3350 (MIRALAX PO) Take 17 g by mouth daily.     . simvastatin (ZOCOR) 20 MG tablet  Take 1 tablet (20 mg total) by mouth daily at 6 PM. PATIENT NEEDS APPOINTMENT FOR FURTHER REFILLS 90 tablet 0  . tiZANidine (ZANAFLEX) 4 MG capsule Take 4-8 mg by mouth 2 (two) times daily. ONE TAB AM - PRN AND 2 TABS AT BEDTIME    . venlafaxine XR (EFFEXOR XR) 150 MG 24 hr capsule Take 1 capsule (150 mg total) by mouth daily with breakfast. 90 capsule 1  . venlafaxine XR (EFFEXOR-XR) 75 MG 24 hr capsule Take 1 capsule (75 mg total) by mouth daily with breakfast. 90 capsule 1    No results found  for this or any previous visit (from the past 48 hour(s)). No results found.  Review of Systems  Constitutional: Negative.   Eyes: Negative.   Respiratory: Negative.   Cardiovascular: Negative.   Gastrointestinal: Negative.   Genitourinary: Negative.   Musculoskeletal: Positive for back pain and neck pain. Negative for myalgias and falls.  Skin: Negative.   Neurological: Negative.   Endo/Heme/Allergies: Negative.   Psychiatric/Behavioral: Negative.     Blood pressure 116/67, pulse 79, temperature 98.1 F (36.7 C), temperature source Oral, resp. rate 20, SpO2 98 %. Physical Exam  Constitutional: She is oriented to person, place, and time. She appears well-developed and well-nourished.  HENT:  Head: Normocephalic and atraumatic.  Eyes: EOM are normal. Pupils are equal, round, and reactive to light.  Neck: Normal range of motion.  Cardiovascular: Normal rate and regular rhythm.   Respiratory: Effort normal and breath sounds normal.  Musculoskeletal: Normal range of motion.  Neurological: She is alert and oriented to person, place, and time.  Skin: Skin is warm and dry.  Psychiatric: She has a normal mood and affect. Her behavior is normal. Thought content normal.     Assessment/Plan ASSESSMENT: 1) LUMBAR POSTLAMINECTOMY SYNDROME 2) lUMBAGO WITH RIGHT SIDE DOMINANT LOWER EXTREMITY RADICULAR SYMPTOMS 3) CHRONIC PAIN  Plan: Permanent SCS, [employer]  Bonna Gains 04/26/2015,  7:36 AM

## 2015-04-26 ENCOUNTER — Ambulatory Visit (HOSPITAL_COMMUNITY): Payer: Medicare Other | Admitting: Anesthesiology

## 2015-04-26 ENCOUNTER — Ambulatory Visit (HOSPITAL_COMMUNITY): Payer: Medicare Other

## 2015-04-26 ENCOUNTER — Ambulatory Visit (HOSPITAL_COMMUNITY)
Admission: RE | Admit: 2015-04-26 | Discharge: 2015-04-26 | Disposition: A | Discharge status: Home / Self Care | Payer: Medicare Other | Source: Ambulatory Visit | Attending: Anesthesiology | Admitting: Anesthesiology

## 2015-04-26 ENCOUNTER — Encounter (HOSPITAL_COMMUNITY)
Admission: RE | Disposition: A | Discharge status: Home / Self Care | Payer: Self-pay | Source: Ambulatory Visit | Attending: Anesthesiology

## 2015-04-26 ENCOUNTER — Ambulatory Visit (HOSPITAL_COMMUNITY): Payer: Medicare Other | Admitting: Emergency Medicine

## 2015-04-26 DIAGNOSIS — M5416 Radiculopathy, lumbar region: Secondary | ICD-10-CM | POA: Insufficient documentation

## 2015-04-26 DIAGNOSIS — M961 Postlaminectomy syndrome, not elsewhere classified: Secondary | ICD-10-CM | POA: Insufficient documentation

## 2015-04-26 DIAGNOSIS — Z8614 Personal history of Methicillin resistant Staphylococcus aureus infection: Secondary | ICD-10-CM | POA: Diagnosis not present

## 2015-04-26 DIAGNOSIS — I739 Peripheral vascular disease, unspecified: Secondary | ICD-10-CM | POA: Diagnosis not present

## 2015-04-26 DIAGNOSIS — Z888 Allergy status to other drugs, medicaments and biological substances status: Secondary | ICD-10-CM | POA: Insufficient documentation

## 2015-04-26 DIAGNOSIS — K219 Gastro-esophageal reflux disease without esophagitis: Secondary | ICD-10-CM | POA: Diagnosis not present

## 2015-04-26 DIAGNOSIS — E785 Hyperlipidemia, unspecified: Secondary | ICD-10-CM | POA: Diagnosis not present

## 2015-04-26 DIAGNOSIS — G43909 Migraine, unspecified, not intractable, without status migrainosus: Secondary | ICD-10-CM | POA: Diagnosis not present

## 2015-04-26 DIAGNOSIS — M1990 Unspecified osteoarthritis, unspecified site: Secondary | ICD-10-CM | POA: Diagnosis not present

## 2015-04-26 DIAGNOSIS — Z9884 Bariatric surgery status: Secondary | ICD-10-CM | POA: Diagnosis not present

## 2015-04-26 DIAGNOSIS — G629 Polyneuropathy, unspecified: Secondary | ICD-10-CM | POA: Diagnosis not present

## 2015-04-26 DIAGNOSIS — G894 Chronic pain syndrome: Secondary | ICD-10-CM | POA: Diagnosis not present

## 2015-04-26 DIAGNOSIS — Z86010 Personal history of colonic polyps: Secondary | ICD-10-CM | POA: Insufficient documentation

## 2015-04-26 DIAGNOSIS — M545 Low back pain: Secondary | ICD-10-CM | POA: Diagnosis not present

## 2015-04-26 DIAGNOSIS — R600 Localized edema: Secondary | ICD-10-CM | POA: Diagnosis not present

## 2015-04-26 DIAGNOSIS — Z880 Allergy status to penicillin: Secondary | ICD-10-CM | POA: Insufficient documentation

## 2015-04-26 DIAGNOSIS — F329 Major depressive disorder, single episode, unspecified: Secondary | ICD-10-CM | POA: Diagnosis not present

## 2015-04-26 DIAGNOSIS — M62838 Other muscle spasm: Secondary | ICD-10-CM | POA: Diagnosis not present

## 2015-04-26 DIAGNOSIS — Z87442 Personal history of urinary calculi: Secondary | ICD-10-CM | POA: Insufficient documentation

## 2015-04-26 DIAGNOSIS — Z86718 Personal history of other venous thrombosis and embolism: Secondary | ICD-10-CM | POA: Diagnosis not present

## 2015-04-26 DIAGNOSIS — Z91048 Other nonmedicinal substance allergy status: Secondary | ICD-10-CM | POA: Insufficient documentation

## 2015-04-26 DIAGNOSIS — G8929 Other chronic pain: Secondary | ICD-10-CM | POA: Diagnosis not present

## 2015-04-26 DIAGNOSIS — Z969 Presence of functional implant, unspecified: Secondary | ICD-10-CM | POA: Diagnosis not present

## 2015-04-26 DIAGNOSIS — M549 Dorsalgia, unspecified: Secondary | ICD-10-CM

## 2015-04-26 DIAGNOSIS — Z462 Encounter for fitting and adjustment of other devices related to nervous system and special senses: Secondary | ICD-10-CM | POA: Diagnosis not present

## 2015-04-26 HISTORY — PX: SPINAL CORD STIMULATOR INSERTION: SHX5378

## 2015-04-26 SURGERY — INSERTION, SPINAL CORD STIMULATOR, LUMBAR
Anesthesia: Monitor Anesthesia Care | Site: Back

## 2015-04-26 MED ORDER — PROPOFOL 10 MG/ML IV BOLUS
INTRAVENOUS | Status: AC
  Filled 2015-04-26: qty 20

## 2015-04-26 MED ORDER — OXYCODONE-ACETAMINOPHEN 10-325 MG PO TABS: 1.0000 | ORAL_TABLET | ORAL | Status: DC | PRN

## 2015-04-26 MED ORDER — PHENYLEPHRINE 40 MCG/ML (10ML) SYRINGE FOR IV PUSH (FOR BLOOD PRESSURE SUPPORT)
PREFILLED_SYRINGE | INTRAVENOUS | Status: AC
  Filled 2015-04-26: qty 10

## 2015-04-26 MED ORDER — ONDANSETRON HCL 4 MG/2ML IJ SOLN
INTRAMUSCULAR | Status: DC | PRN
  Administered 2015-04-26: 4 mg via INTRAVENOUS

## 2015-04-26 MED ORDER — LIDOCAINE HCL (CARDIAC) 20 MG/ML IV SOLN
INTRAVENOUS | Status: AC
  Filled 2015-04-26: qty 5

## 2015-04-26 MED ORDER — BACITRACIN 50000 UNITS IM SOLR
INTRAMUSCULAR | Status: DC | PRN
  Administered 2015-04-26: 500 mL

## 2015-04-26 MED ORDER — FENTANYL CITRATE (PF) 250 MCG/5ML IJ SOLN
INTRAMUSCULAR | Status: AC
  Filled 2015-04-26: qty 5

## 2015-04-26 MED ORDER — PROPOFOL 500 MG/50ML IV EMUL
INTRAVENOUS | Status: DC | PRN
  Administered 2015-04-26: 50 ug/kg/min via INTRAVENOUS

## 2015-04-26 MED ORDER — EPHEDRINE SULFATE 50 MG/ML IJ SOLN
INTRAMUSCULAR | Status: AC
  Filled 2015-04-26: qty 1

## 2015-04-26 MED ORDER — HYDROMORPHONE HCL 1 MG/ML IJ SOLN: 0.2500 mg | INTRAMUSCULAR | Status: DC | PRN

## 2015-04-26 MED ORDER — SUCCINYLCHOLINE CHLORIDE 20 MG/ML IJ SOLN
INTRAMUSCULAR | Status: AC
  Filled 2015-04-26: qty 1

## 2015-04-26 MED ORDER — PROMETHAZINE HCL 25 MG/ML IJ SOLN: 6.2500 mg | INTRAMUSCULAR | Status: DC | PRN

## 2015-04-26 MED ORDER — LIDOCAINE HCL (CARDIAC) 20 MG/ML IV SOLN
INTRAVENOUS | Status: DC | PRN
  Administered 2015-04-26: 60 mg via INTRAVENOUS

## 2015-04-26 MED ORDER — MIDAZOLAM HCL 5 MG/5ML IJ SOLN
INTRAMUSCULAR | Status: DC | PRN
  Administered 2015-04-26: 2 mg via INTRAVENOUS

## 2015-04-26 MED ORDER — FENTANYL CITRATE (PF) 250 MCG/5ML IJ SOLN
INTRAMUSCULAR | Status: DC | PRN
  Administered 2015-04-26: 100 ug via INTRAVENOUS
  Administered 2015-04-26 (×2): 25 ug via INTRAVENOUS

## 2015-04-26 MED ORDER — MIDAZOLAM HCL 2 MG/2ML IJ SOLN
INTRAMUSCULAR | Status: AC
  Filled 2015-04-26: qty 2

## 2015-04-26 MED ORDER — 0.9 % SODIUM CHLORIDE (POUR BTL) OPTIME
TOPICAL | Status: DC | PRN
  Administered 2015-04-26: 1000 mL

## 2015-04-26 MED ORDER — BUPIVACAINE-EPINEPHRINE (PF) 0.5% -1:200000 IJ SOLN
INTRAMUSCULAR | Status: DC | PRN
  Administered 2015-04-26: 40 mL via PERINEURAL

## 2015-04-26 MED ORDER — LACTATED RINGERS IV SOLN
INTRAVENOUS | Status: DC | PRN
  Administered 2015-04-26: 08:00:00 via INTRAVENOUS

## 2015-04-26 MED ORDER — CLINDAMYCIN HCL 150 MG PO CAPS: 150.0000 mg | ORAL_CAPSULE | Freq: Three times a day (TID) | ORAL | Status: DC

## 2015-04-26 MED ORDER — SODIUM CHLORIDE 0.9 % IJ SOLN
INTRAMUSCULAR | Status: AC
  Filled 2015-04-26: qty 10

## 2015-04-26 SURGICAL SUPPLY — 65 items
ANCH LD 4 SETX2 CLIK X (Stimulator) ×1 IMPLANT
ANCHOR CLIK X NEURO (Stimulator) ×1 IMPLANT
APL SKNCLS STERI-STRIP NONHPOA (GAUZE/BANDAGES/DRESSINGS)
BAG DECANTER FOR FLEXI CONT (MISCELLANEOUS) ×2 IMPLANT
BENZOIN TINCTURE PRP APPL 2/3 (GAUZE/BANDAGES/DRESSINGS) IMPLANT
BINDER ABDOMINAL 12 ML 46-62 (SOFTGOODS) ×2 IMPLANT
BLADE CLIPPER SURG (BLADE) IMPLANT
CABLE/EXTENSION OR 1X16 61 (CABLE) ×2 IMPLANT
CHLORAPREP W/TINT 26ML (MISCELLANEOUS) ×2 IMPLANT
CLIP TI WIDE RED SMALL 6 (CLIP) IMPLANT
DRAPE C-ARM 42X72 X-RAY (DRAPES) ×2 IMPLANT
DRAPE C-ARMOR (DRAPES) ×2 IMPLANT
DRAPE LAPAROTOMY 100X72X124 (DRAPES) ×2 IMPLANT
DRAPE POUCH INSTRU U-SHP 10X18 (DRAPES) ×2 IMPLANT
DRAPE SURG 17X23 STRL (DRAPES) ×2 IMPLANT
DRSG OPSITE POSTOP 3X4 (GAUZE/BANDAGES/DRESSINGS) IMPLANT
DRSG OPSITE POSTOP 4X6 (GAUZE/BANDAGES/DRESSINGS) IMPLANT
ELECT REM PT RETURN 9FT ADLT (ELECTROSURGICAL) ×2
ELECTRODE REM PT RTRN 9FT ADLT (ELECTROSURGICAL) ×1 IMPLANT
GAUZE SPONGE 4X4 16PLY XRAY LF (GAUZE/BANDAGES/DRESSINGS) ×2 IMPLANT
GLOVE BIOGEL PI IND STRL 7.5 (GLOVE) ×1 IMPLANT
GLOVE BIOGEL PI INDICATOR 7.5 (GLOVE) ×1
GLOVE ECLIPSE 7.5 STRL STRAW (GLOVE) ×2 IMPLANT
GLOVE EXAM NITRILE LRG STRL (GLOVE) IMPLANT
GLOVE EXAM NITRILE MD LF STRL (GLOVE) IMPLANT
GLOVE EXAM NITRILE XL STR (GLOVE) IMPLANT
GLOVE EXAM NITRILE XS STR PU (GLOVE) IMPLANT
GOWN STRL REUS W/ TWL LRG LVL3 (GOWN DISPOSABLE) IMPLANT
GOWN STRL REUS W/ TWL XL LVL3 (GOWN DISPOSABLE) IMPLANT
GOWN STRL REUS W/TWL 2XL LVL3 (GOWN DISPOSABLE) IMPLANT
GOWN STRL REUS W/TWL LRG LVL3 (GOWN DISPOSABLE)
GOWN STRL REUS W/TWL XL LVL3 (GOWN DISPOSABLE)
IPG PRECISION SPECTRA (Stimulator) ×1 IMPLANT
KIT BASIN OR (CUSTOM PROCEDURE TRAY) ×2 IMPLANT
KIT CHARGING (KITS) ×1
KIT CHARGING PRECISION NEURO (KITS) IMPLANT
KIT PAT PROGRAM FREELINK (KITS) IMPLANT
KIT ROOM TURNOVER OR (KITS) ×2 IMPLANT
KIT SPLITTER 30CM 2X8 (Stimulator) ×2 IMPLANT
LEAD KIT CONTACT INFINION 16 (Stimulator) ×2 IMPLANT
LIQUID BAND (GAUZE/BANDAGES/DRESSINGS) ×2 IMPLANT
NDL 18GX1X1/2 (RX/OR ONLY) (NEEDLE) IMPLANT
NDL HYPO 25X1 1.5 SAFETY (NEEDLE) ×1 IMPLANT
NEEDLE 18GX1X1/2 (RX/OR ONLY) (NEEDLE) IMPLANT
NEEDLE HYPO 25X1 1.5 SAFETY (NEEDLE) ×2 IMPLANT
NS IRRIG 1000ML POUR BTL (IV SOLUTION) ×2 IMPLANT
PACK LAMINECTOMY NEURO (CUSTOM PROCEDURE TRAY) ×2 IMPLANT
PAD ARMBOARD 7.5X6 YLW CONV (MISCELLANEOUS) ×2 IMPLANT
REMOTE CONTROL KIT (KITS) ×2
SPONGE LAP 4X18 X RAY DECT (DISPOSABLE) ×2 IMPLANT
SPONGE SURGIFOAM ABS GEL SZ50 (HEMOSTASIS) IMPLANT
STAPLER SKIN PROX WIDE 3.9 (STAPLE) ×2 IMPLANT
STRIP CLOSURE SKIN 1/2X4 (GAUZE/BANDAGES/DRESSINGS) IMPLANT
SUT MNCRL AB 4-0 PS2 18 (SUTURE) IMPLANT
SUT SILK 0 (SUTURE) ×2
SUT SILK 0 MO-6 18XCR BRD 8 (SUTURE) ×1 IMPLANT
SUT SILK 0 TIES 10X30 (SUTURE) IMPLANT
SUT SILK 2 0 TIES 10X30 (SUTURE) IMPLANT
SUT VIC AB 2-0 CP2 18 (SUTURE) ×4 IMPLANT
SYR EPIDURAL 5ML GLASS (SYRINGE) ×2 IMPLANT
SYRINGE 10CC LL (SYRINGE) IMPLANT
TOWEL OR 17X24 6PK STRL BLUE (TOWEL DISPOSABLE) ×2 IMPLANT
TOWEL OR 17X26 10 PK STRL BLUE (TOWEL DISPOSABLE) ×2 IMPLANT
WATER STERILE IRR 1000ML POUR (IV SOLUTION) ×2 IMPLANT
YANKAUER SUCT BULB TIP NO VENT (SUCTIONS) ×2 IMPLANT

## 2015-04-26 NOTE — Transfer of Care (Signed)
Immediate Anesthesia Transfer of Care Note  Patient: [name]  Procedure(s) Performed: Procedure(s) with comments: LUMBAR SPINAL CORD STIMULATOR INSERTION (N/A) - LUMBAR SPINAL CORD STIMULATOR INSERTION  Patient Location: PACU  Anesthesia Type:MAC  Level of Consciousness: awake, alert , oriented and patient cooperative  Airway & Oxygen Therapy: Patient Spontanous Breathing and Patient connected to nasal cannula oxygen  Post-op Assessment: Report given to RN, Post -op Vital signs reviewed and stable and Patient moving all extremities  Post vital signs: Reviewed and stable  Last Vitals:  Filed Vitals:   04/26/15 0618  BP: 116/67  Pulse: 79  Temp: 36.7 C  Resp: 20    Complications: No apparent anesthesia complications

## 2015-04-26 NOTE — Anesthesia Postprocedure Evaluation (Signed)
Anesthesia Post Note  Patient: Katrina Fernandez  Procedure(s) Performed: Procedure(s) (LRB): LUMBAR SPINAL CORD STIMULATOR INSERTION (N/A)  Patient location during evaluation: PACU Anesthesia Type: MAC Level of consciousness: awake and alert Pain management: pain level controlled Vital Signs Assessment: post-procedure vital signs reviewed and stable Respiratory status: spontaneous breathing, nonlabored ventilation, respiratory function stable and patient connected to nasal cannula oxygen Cardiovascular status: stable and blood pressure returned to baseline Anesthetic complications: no    Last Vitals:  Filed Vitals:   04/26/15 1000 04/26/15 1030  BP:    Pulse: 77   Temp: 36.1 C 36.3 C  Resp: 20     Last Pain:  Filed Vitals:   04/26/15 1033  PainSc: [place]

## 2015-04-26 NOTE — Discharge Instructions (Signed)
Dr. Caly Pellum Post-Op Orders ° °• Ice Pack - 20 minutes on (in a pillow case), and 20 minutes off. Wear the ice pack UNDER the binder. °• Follow up in office, they will call you for an appointment in 10 days to 2 weeks. °• Increase activity gradually.   °• No lifting anything heavier than a gallon of milk (10 pounds) until seen in the office. °• Advance diet slowly as tolerated. °• Dressing care:  Keep dressing dry for 3 days, and on Post-op day 4, may shower. °• Call for fever, drainage, and redness. °• No swimming or bathing in a bathtub (do not get into standing water). °•  °

## 2015-04-26 NOTE — Anesthesia Preprocedure Evaluation (Addendum)
Anesthesia Evaluation  Patient identified by MRN, date of birth, ID band Patient awake    Reviewed: Allergy & Precautions, NPO status , Patient's Chart, lab work & pertinent test results  History of Anesthesia Complications (+) PONV, DIFFICULT IV STICK / SPECIAL LINE and history of anesthetic complications  Airway Mallampati: II  TM Distance: <3 FB Neck ROM: Full    Dental  (+) Teeth Intact, Dental Advisory Given   Pulmonary neg pulmonary ROS,    Pulmonary exam normal breath sounds clear to auscultation       Cardiovascular + Peripheral Vascular Disease  Normal cardiovascular exam     Neuro/Psych PSYCHIATRIC DISORDERS Anxiety Depression    GI/Hepatic Neg liver ROS, GERD  Medicated and Poorly Controlled,  Endo/Other  Morbid obesity  Renal/GU negative Renal ROS     Musculoskeletal  (+) Arthritis , Osteoarthritis,  Chronic pain   Abdominal (+) + obese,   Peds  Hematology   Anesthesia Other Findings Chronic pain management- Methadone  Reproductive/Obstetrics                            Anesthesia Physical  Anesthesia Plan  ASA: II  Anesthesia Plan: MAC   Post-op Pain Management:    Induction:   Airway Management Planned: Simple Face Mask  Additional Equipment:   Intra-op Plan:   Post-operative Plan:   Informed Consent: I have reviewed the patients History and Physical, chart, labs and discussed the procedure including the risks, benefits and alternatives for the proposed anesthesia with the patient or authorized representative who has indicated his/her understanding and acceptance.   Dental advisory given  Plan Discussed with: CRNA, Anesthesiologist and Surgeon  Anesthesia Plan Comments:        Anesthesia Quick Evaluation

## 2015-04-26 NOTE — Op Note (Signed)
PREOP DX: 1) lumbago  2) lumbar radiculopathy  3) lumbar post-laminectomy syndrome  4) chronic pain  POSTOP DX: 1) lumbago  2) lumbar radiculopathy  3) lumbar post-laminectomy syndrome  4) chronic pain  PROCEDURES PERFORMED:1) intraop fluoro 2) placement of 2 16 contact [employer] Infinion leads 3) placement of Spectra SCS generator  SURGEON:Britaney Espaillat  ASSISTANT: NONE  ANESTHESIA: MAC  EBL: <20cc  DESCRIPTION OF PROCEDURE: After a discussion of risks, benefits and alternatives, informed consent was obtained. The patient was taken to the OR, turned prone onto a Jackson table, all pressure points padded, SCD's placed, and an adequate plane of anesthesia induced. A timeout was taken to verify the correct patient, position, personnel, availability of appropriate equipment, and administration of perioperative antibiotics.  The thoracic and lumbar areas were widely prepped with chloraprep and draped into a sterile field. Fluoroscopy was used to plan a right paramedian incision at the L1-L3 levels, and an incision made with a 10 blade and carried down to the dorsolumbar fascia with the bovie and blunt dissection. Retractors were placed and a 14g [employer] tuohy needle placed into the epidural space at the T12-L1 interspace using biplanar fluoro and loss-of-resistance technique. The needle was aspirated without any return of fluid. A [employer] INFINION lead was introduced and under live AP fluoro advanced until the distal-most contact overlay the caudad aspect of the T7 vertebral body shadow with the rest of the contacts distributed over the T8 and T9 vertebral bodies in a position just right of anatomic midline. A second Infinion lead was placed just left of anatomic midline in the same levels using the same technique. The patient was awakened and the leads tested; impedances were good, and the patient reported good coverage with amplitudes in the 3-7 mA range. 0 silk  sutures were placed in the fascia adjacent to the needles. The needles and stylets were removed under fluoroscopy with no lead migration noted. Leads were then fixed to the fascia with Clik anchors; repeat images were obtained to verify that there had been no lead migration.  The incision was inspected and hemostasis obtained with the bipolar cautery.  Attention was then turned to creation of a subcutaneous pocket. At the left flank, a 3 cm incision was made with a 10 blade and using the bovie and blunt dissection a pocket of size appropriate to place a SCS generator. The pocket was trialed, and found to be of adequate size. The pocket was inspected for hemostasis, which was found to be excellent. Using reverse seldinger technique, the leads were tunneled to the pocket site, and the leads inserted into the SCS generator. Impedances were checked, and all found to be excellent. The leads were then all fixed into position with a self-torquing wrench. The wiring was all carefully coiled, placed behind the generator and placed in the pocket.  Both incisions were copiously irrigated with bacitracin-containing irrigation. The lumbar incision was closed in 2 deep layers of interrupted 2-0 vicryl and the skin closed with staples The pocket incision was closed with a deeper layer of 2-0 vicryl interrupted sutures, and the skin closed with staples. Antibiotic ointment and sterile dressings were applied.   Needle, sponge, and instrument counts were correct x2 at the end of the case.    The patient was then carefully awakened from anesthesia, turned supine, an abdominal binder placed, and the patient taken to the recovery room where she underwent complex spinal cord stimulator programming.  COMPLICATIONS: NONE  CONDITION: Stable throughout the  course of the procedure and immediately afterward  DISPOSITION: discharge to home, with antibiotics and pain medicine. Discussed care with the patient and her family.  Followup in clinic will be scheduled in 10-14 days.

## 2015-04-29 ENCOUNTER — Encounter (HOSPITAL_COMMUNITY): Payer: Self-pay | Admitting: Anesthesiology

## 2015-05-02 ENCOUNTER — Other Ambulatory Visit: Payer: Self-pay | Admitting: Family Medicine

## 2015-05-09 ENCOUNTER — Encounter: Payer: Self-pay | Admitting: Family Medicine

## 2015-05-09 DIAGNOSIS — Z982 Presence of cerebrospinal fluid drainage device: Secondary | ICD-10-CM | POA: Diagnosis not present

## 2015-05-09 DIAGNOSIS — Z6837 Body mass index (BMI) 37.0-37.9, adult: Secondary | ICD-10-CM | POA: Diagnosis not present

## 2015-05-09 DIAGNOSIS — M5137 Other intervertebral disc degeneration, lumbosacral region: Secondary | ICD-10-CM | POA: Diagnosis not present

## 2015-05-09 DIAGNOSIS — G8929 Other chronic pain: Secondary | ICD-10-CM | POA: Insufficient documentation

## 2015-05-09 DIAGNOSIS — M549 Dorsalgia, unspecified: Secondary | ICD-10-CM

## 2015-05-09 DIAGNOSIS — M961 Postlaminectomy syndrome, not elsewhere classified: Secondary | ICD-10-CM | POA: Diagnosis not present

## 2015-05-09 DIAGNOSIS — Z9689 Presence of other specified functional implants: Secondary | ICD-10-CM | POA: Diagnosis not present

## 2015-05-17 DIAGNOSIS — T849XXS Unspecified complication of internal orthopedic prosthetic device, implant and graft, sequela: Secondary | ICD-10-CM | POA: Diagnosis not present

## 2015-05-17 DIAGNOSIS — M961 Postlaminectomy syndrome, not elsewhere classified: Secondary | ICD-10-CM | POA: Diagnosis not present

## 2015-05-17 DIAGNOSIS — M5440 Lumbago with sciatica, unspecified side: Secondary | ICD-10-CM | POA: Diagnosis not present

## 2015-05-17 DIAGNOSIS — M7061 Trochanteric bursitis, right hip: Secondary | ICD-10-CM | POA: Diagnosis not present

## 2015-05-27 ENCOUNTER — Ambulatory Visit: Payer: Medicare Other | Admitting: Sports Medicine

## 2015-05-30 ENCOUNTER — Telehealth: Payer: Self-pay | Admitting: Family Medicine

## 2015-05-30 ENCOUNTER — Encounter: Payer: Self-pay | Admitting: Sports Medicine

## 2015-05-30 ENCOUNTER — Ambulatory Visit (INDEPENDENT_AMBULATORY_CARE_PROVIDER_SITE_OTHER): Payer: Medicare Other | Admitting: Sports Medicine

## 2015-05-30 VITALS — BP 118/76 | HR 74 | Temp 98.1°F | Resp 18 | Wt 214.1 lb

## 2015-05-30 DIAGNOSIS — F329 Major depressive disorder, single episode, unspecified: Secondary | ICD-10-CM | POA: Diagnosis not present

## 2015-05-30 DIAGNOSIS — M25571 Pain in right ankle and joints of right foot: Secondary | ICD-10-CM

## 2015-05-30 DIAGNOSIS — M25572 Pain in left ankle and joints of left foot: Secondary | ICD-10-CM

## 2015-05-30 DIAGNOSIS — M797 Fibromyalgia: Secondary | ICD-10-CM | POA: Diagnosis not present

## 2015-05-30 DIAGNOSIS — F32A Depression, unspecified: Secondary | ICD-10-CM

## 2015-05-30 MED ORDER — LEVOMILNACIPRAN HCL ER 20 & 40 MG PO C4PK: EXTENDED_RELEASE_CAPSULE | ORAL | Status: DC

## 2015-05-30 NOTE — Patient Instructions (Signed)
Decrease Effexor to 150 mg daily for one week then 75 mg daily for one week then stop

## 2015-05-30 NOTE — Assessment & Plan Note (Signed)
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 Large component of pain is myofascial.

## 2015-05-30 NOTE — Assessment & Plan Note (Signed)
Switching from Effexor to Fetzima, Cymbalta was too expensive Decrease Effexor to 150 mg daily for one week then 75 mg daily for one week then stop

## 2015-05-30 NOTE — Telephone Encounter (Signed)
Changing Fetzima to under my name

## 2015-05-30 NOTE — Progress Notes (Signed)
   Subjective:    I'm seeing this patient as a consultation for:  Dr. Ileene Rubens  CC: Bilateral ankle pain  HPI: This is a pleasant 50 year old female, she comes in with a several week history of increasing pain that she localizes over the anterior aspect of both ankles, laterally. No trauma, no change in her activities. Moderate, persistent. Further questioning she has widespread pain all over her body, she is currently on Effexor for depression, Cymbalta was highly effective for both muscular pain as well as depression however was too expensive.  Past medical history, Surgical history, Family history not pertinant except as noted below, Social history, Allergies, and medications have been entered into the medical record, reviewed, and no changes needed.   Review of Systems: No headache, visual changes, nausea, vomiting, diarrhea, constipation, dizziness, abdominal pain, skin rash, fevers, chills, night sweats, weight loss, swollen lymph nodes, body aches, joint swelling, muscle aches, chest pain, shortness of breath, mood changes, visual or auditory hallucinations.   Objective:   General: Well Developed, well nourished, and in no acute distress.  Neuro/Psych: Alert and oriented x3, extra-ocular muscles intact, able to move all 4 extremities, sensation grossly intact. Skin: Warm and dry, no rashes noted.  Respiratory: Not using accessory muscles, speaking in full sentences, trachea midline.  Cardiovascular: Pulses palpable, no extremity edema. Abdomen: Does not appear distended. Bilateral ankles: Visible prominent fat pad laterally, with tenderness over the lateral ankle mortise. Range of motion is full in all directions. Strength is 5/5 in all directions. Stable lateral and medial ligaments; squeeze test and kleiger test unremarkable; Talar dome nontender; No pain at base of 5th MT; No tenderness over cuboid; No tenderness over N spot or navicular prominence No tenderness on posterior  aspects of lateral and medial malleolus No sign of peroneal tendon subluxations; Negative tarsal tunnel tinel's Pes cavus.  Impression and Recommendations:   This case required medical decision making of moderate complexity.

## 2015-06-03 ENCOUNTER — Encounter: Payer: Medicare Other | Admitting: Sports Medicine

## 2015-06-14 DIAGNOSIS — T849XXS Unspecified complication of internal orthopedic prosthetic device, implant and graft, sequela: Secondary | ICD-10-CM | POA: Diagnosis not present

## 2015-06-14 DIAGNOSIS — M961 Postlaminectomy syndrome, not elsewhere classified: Secondary | ICD-10-CM | POA: Diagnosis not present

## 2015-06-14 DIAGNOSIS — M5440 Lumbago with sciatica, unspecified side: Secondary | ICD-10-CM | POA: Diagnosis not present

## 2015-06-14 DIAGNOSIS — M533 Sacrococcygeal disorders, not elsewhere classified: Secondary | ICD-10-CM | POA: Diagnosis not present

## 2015-06-28 ENCOUNTER — Ambulatory Visit (INDEPENDENT_AMBULATORY_CARE_PROVIDER_SITE_OTHER): Payer: Medicare Other | Admitting: Sports Medicine

## 2015-06-28 VITALS — BP 120/84 | HR 96 | Resp 18 | Wt 216.9 lb

## 2015-06-28 DIAGNOSIS — M1812 Unilateral primary osteoarthritis of first carpometacarpal joint, left hand: Secondary | ICD-10-CM | POA: Diagnosis not present

## 2015-06-28 MED ORDER — MELOXICAM 15 MG PO TABS: ORAL_TABLET | ORAL | Status: DC

## 2015-06-28 NOTE — Assessment & Plan Note (Signed)
Injection into the trapeziometacarpal joint of the left thumb, x-rays, return in one month. She does have what sounds to be widespread hand osteoarthritis with gelling in the mornings, adding meloxicam.

## 2015-06-28 NOTE — Progress Notes (Signed)
  Subjective:    CC: Left hand pain  HPI: This is a pleasant 50 year old female, for several weeks she's had increasing pain and she localizes at the base of her left thumb, moderate, persistent with stiffness in both hands in the mornings. No trauma. No swelling.  Chronic pain: Doing much better and decreasing her narcotic dose after spinal cord stimulator implantation.  Past medical history, Surgical history, Family history not pertinant except as noted below, Social history, Allergies, and medications have been entered into the medical record, reviewed, and no changes needed.   Review of Systems: No fevers, chills, night sweats, weight loss, chest pain, or shortness of breath.   Objective:    General: Well Developed, well nourished, and in no acute distress.  Neuro: Alert and oriented x3, extra-ocular muscles intact, sensation grossly intact.  HEENT: Normocephalic, atraumatic, pupils equal round reactive to light, neck supple, no masses, no lymphadenopathy, thyroid nonpalpable.  Skin: Warm and dry, no rashes. Cardiac: Regular rate and rhythm, no murmurs rubs or gallops, no lower extremity edema.  Respiratory: Clear to auscultation bilaterally. Not using accessory muscles, speaking in full sentences. Left wrist: Tender palpation at the trapeziometacarpal joint. Good motion, good strength.  Procedure: Real-time Ultrasound Guided Injection of left trapeziometacarpal joint Device: GE Logiq E  Verbal informed consent obtained.  Time-out conducted.  Noted no overlying erythema, induration, or other signs of local infection.  Skin prepped in a sterile fashion.  Local anesthesia: Topical Ethyl chloride.  With sterile technique and under real time ultrasound guidance:  0.5 mL kenalog 40, 0.5 mL lidocaine injected easily. Completed without difficulty  Pain immediately resolved suggesting accurate placement of the medication.  Advised to call if fevers/chills, erythema, induration, drainage,  or persistent bleeding.  Images permanently stored and available for review in the ultrasound unit.  Impression: Technically successful ultrasound guided injection.   Impression and Recommendations:

## 2015-07-04 ENCOUNTER — Encounter: Payer: Medicare Other | Admitting: Sports Medicine

## 2015-07-10 ENCOUNTER — Other Ambulatory Visit: Payer: Self-pay | Admitting: Family Medicine

## 2015-07-10 ENCOUNTER — Telehealth: Payer: Self-pay | Admitting: Family Medicine

## 2015-07-10 NOTE — Telephone Encounter (Signed)
I called patient to tell her she is due for a f/u on her Simvastatin and scheduled her for March 1st with Dr. Ileene Rubens. Thanks, [employer]

## 2015-07-12 DIAGNOSIS — M5440 Lumbago with sciatica, unspecified side: Secondary | ICD-10-CM | POA: Diagnosis not present

## 2015-07-12 DIAGNOSIS — M65331 Trigger finger, right middle finger: Secondary | ICD-10-CM | POA: Diagnosis not present

## 2015-07-12 DIAGNOSIS — M7712 Lateral epicondylitis, left elbow: Secondary | ICD-10-CM | POA: Diagnosis not present

## 2015-07-12 DIAGNOSIS — T849XXS Unspecified complication of internal orthopedic prosthetic device, implant and graft, sequela: Secondary | ICD-10-CM | POA: Diagnosis not present

## 2015-07-12 DIAGNOSIS — M961 Postlaminectomy syndrome, not elsewhere classified: Secondary | ICD-10-CM | POA: Diagnosis not present

## 2015-07-12 DIAGNOSIS — M533 Sacrococcygeal disorders, not elsewhere classified: Secondary | ICD-10-CM | POA: Diagnosis not present

## 2015-07-12 DIAGNOSIS — M1812 Unilateral primary osteoarthritis of first carpometacarpal joint, left hand: Secondary | ICD-10-CM | POA: Diagnosis not present

## 2015-07-12 DIAGNOSIS — Z4789 Encounter for other orthopedic aftercare: Secondary | ICD-10-CM | POA: Diagnosis not present

## 2015-07-17 ENCOUNTER — Encounter: Payer: Self-pay | Admitting: Family Medicine

## 2015-07-17 ENCOUNTER — Ambulatory Visit (INDEPENDENT_AMBULATORY_CARE_PROVIDER_SITE_OTHER): Payer: Medicare Other | Admitting: Family Medicine

## 2015-07-17 VITALS — BP 126/88 | HR 105 | Wt 214.0 lb

## 2015-07-17 DIAGNOSIS — F329 Major depressive disorder, single episode, unspecified: Secondary | ICD-10-CM | POA: Diagnosis not present

## 2015-07-17 DIAGNOSIS — E669 Obesity, unspecified: Secondary | ICD-10-CM

## 2015-07-17 DIAGNOSIS — E785 Hyperlipidemia, unspecified: Secondary | ICD-10-CM | POA: Diagnosis not present

## 2015-07-17 DIAGNOSIS — F32A Depression, unspecified: Secondary | ICD-10-CM

## 2015-07-17 LAB — LIPID PANEL
Cholesterol: 194 mg/dL (ref 125–200)
HDL: 85 mg/dL (ref >=46)
LDL Cholesterol: 73 mg/dL (ref <130)
Total CHOL/HDL Ratio: 2.3 Ratio (ref <=5.0)
Triglycerides: 178 mg/dL — ABNORMAL HIGH (ref <150)
VLDL: 36 mg/dL — ABNORMAL HIGH (ref <30)

## 2015-07-17 MED ORDER — PHENTERMINE-TOPIRAMATE ER 11.25-69 MG PO CP24: ORAL_CAPSULE | ORAL | Status: DC

## 2015-07-17 MED ORDER — DULOXETINE HCL 60 MG PO CPEP: 120.0000 mg | ORAL_CAPSULE | Freq: Every day | ORAL | Status: DC

## 2015-07-17 NOTE — Progress Notes (Signed)
CC: Katrina Fernandez is a 50 y.o. female is here for Medication Refill   Subjective: HPI:  Comes in with daughter, [name], today.  Follow-up hyperlipidemia: Taking simvastatin on a daily basis without right upper quadrant pain or myalgias. No limb claudication or chest pain. She is about to start a new exercise regimen by cycling.  Follow-up depression: She is taking Effexor on a daily basis. She really wishes that she  To go back to taking duloxetine. She appears that it may be generic now. It worked great for depression and currently she is still experiencing a mild degree of depression. It's worse if she skips exercising most days of the week. Improves with exercise. She denies any anxiety.  Follow-up obesity: She is contemplating getting bariatric surgery for weight loss. She liked her something she can do other than surgery she's tried exercising and portion control without much benefit.   Review Of Systems Outlined In HPI  Past Medical History  Diagnosis Date  . Hyperlipidemia     takes Simvastatin daily  . History of DVT of lower extremity     LEFT LEG --  3/ 2014.Was on a blood transfusion for 6 months  . PONV (postoperative nausea and vomiting)   . Chronic constipation     takes Miralax daily  . Chronic pain syndrome     takes Methadone and Keppra daily  . Depression     takes Effexor daily  . Peripheral edema     takes Lasix daily as needed  . Muscle spasm     takes Zanaflex nightly  . Seasonal allergies     takes Claritin daily  . Pneumonia     hx of-7-8 yrs ago  . History of bronchitis 10+ yrs ago  . History of migraine     last one a month ago  . Weakness     and numbness in right hand  . Trigger finger     right  . Peripheral neuropathy ([place])   . Arthritis     back and knees  . Chronic back pain     buldging,DDD  . GERD (gastroesophageal reflux disease)     not taking anything at the present time  . History of colon polyps     benign  . History of  kidney stones   . History of MRSA infection 10 yrs ago    Past Surgical History  Procedure Laterality Date  . Carpal tunnel release Bilateral 2001  . Cholecystectomy    . Laparoscopic gastric banding  2011  . Breast reduction surgery Bilateral 06/07/2013    Procedure: BILATERAL MAMMARY REDUCTION  (BREAST);  Surgeon: Theodoro Kos, DO;  Location: [place];  Service: Plastics;  Laterality: Bilateral;  . Breast reduction surgery Bilateral 06/07/2013    Procedure:  LIPOSUCTION;  Surgeon: Theodoro Kos, DO;  Location: [place];  Service: Plastics;  Laterality: Bilateral;  . Posterior cervical laminectomy  03-01-2001    C7-8 and Exploration C8 perineural cyst  . Placement lumboperitoneal shunt for cerebrospinal fluid diversion away from c8 perineural cyst  09-05-2003  &  12-24-2003    08-31-2003  REMOVAL SHUNT  . Posterior lumbar laminectomy l4-5/  diskectomy l5-s1  05-20-2005  . Re-do decompression laminectomy and fusion l4-5  &  l5-s1  12-20-2006    02-04-2007--  RE-EXPLORATION L4 to S1, I & D LUMBAR WOUND HEMATOMA  . Anterior cervical decomp/discectomy fusion  07-25-2009    C5 -- C6  . Appendectomy  age 31  . Total abdominal hysterectomy  1994    w/  Bilateral Salpinoophorectomy  . Tennis elbow release/nirschel procedure Left 12/13/2014    Procedure: LEFT ELBOW LATERAL EPICONDYLAR DEBRIDEMENT AND OR REPAIR -RECONSTRUCTION ;  Surgeon: Iran Planas, MD;  Location: [place];  Service: Orthopedics;  Laterality: Left;  . Gastric banding undone  11/2014  . Colonoscopy    . Esophagogastroduodenoscopy    . Spinal cord stimulator insertion N/A 04/26/2015    Procedure: LUMBAR SPINAL CORD STIMULATOR INSERTION;  Surgeon: Clydell Hakim, MD;  Location: [place] [facility];  Service: Neurosurgery;  Laterality: N/A;  LUMBAR SPINAL CORD STIMULATOR INSERTION   Family History  Problem Relation Age of Onset  . Depression    . Colon cancer Father   . Ovarian cancer  Maternal Grandmother   . Pancreatic cancer Maternal Grandfather     Social History   Social History  . Marital Status: Married    Spouse Name: N/A  . Number of Children: N/A  . Years of Education: N/A   Occupational History  . Not on file.   Social History Main Topics  . Smoking status: Never Smoker   . Smokeless tobacco: Never Used  . Alcohol Use: No  . Drug Use: No  . Sexual Activity: Yes    Birth Control/ Protection: Surgical   Other Topics Concern  . Not on file   Social History Narrative     Objective: BP 126/88 mmHg  Pulse 105  Wt 214 lb (97.07 kg)  Vital signs reviewed. General: Alert and Oriented, No Acute Distress HEENT: Pupils equal, round, reactive to light. Conjunctivae clear.  External ears unremarkable.  Moist mucous membranes. Lungs: Clear and comfortable work of breathing, speaking in full sentences without accessory muscle use. Cardiac: Regular rate and rhythm.  Neuro: CN II-XII grossly intact, gait normal. Extremities: No peripheral edema.  Strong peripheral pulses.  Mental Status: No depression, anxiety, nor agitation. Logical though process. Skin: Warm and dry.  Assessment & Plan: Katrina Fernandez was seen today for medication refill.  Diagnoses and all orders for this visit:  Hyperlipidemia -     Lipid panel  Depression  Obesity  Other orders -     Phentermine-Topiramate (QSYMIA) 11.25-69 MG CP24; One by mouth daily for weight loss control. -     DULoxetine (CYMBALTA) 60 MG capsule; Take 2 capsules (120 mg total) by mouth daily.    hyperlipidemia: clinically controlled, due for repeat lipid panel, continue simvastatin pending results. Depression: uncontrolled chronic condition, switching to Cymbalta if financially reasonable, stop effexor if able to afford cymbalta Obesity: Uncontrolled, exploring medication options such as Qsymia before comitting to surgery.  Return in about 6 months (around 01/17/2016) for Mood/Fibromyalgia.

## 2015-07-18 ENCOUNTER — Telehealth: Payer: Self-pay | Admitting: Family Medicine

## 2015-07-18 MED ORDER — SIMVASTATIN 20 MG PO TABS: ORAL_TABLET | ORAL | Status: DC

## 2015-07-18 NOTE — Telephone Encounter (Signed)
Patient advised of recommendations.  

## 2015-07-18 NOTE — Telephone Encounter (Signed)
Left message for patient to call office for information regarding medication.

## 2015-07-18 NOTE — Telephone Encounter (Signed)
Will you please let patient know that her simvastatin is still  Effective at helping her keep her LDL cholesterol at goal. I'll send in 90 day supply refills to [facility].

## 2015-07-19 DIAGNOSIS — Z9884 Bariatric surgery status: Secondary | ICD-10-CM | POA: Diagnosis not present

## 2015-07-19 DIAGNOSIS — Z6839 Body mass index (BMI) 39.0-39.9, adult: Secondary | ICD-10-CM | POA: Diagnosis not present

## 2015-07-19 DIAGNOSIS — E669 Obesity, unspecified: Secondary | ICD-10-CM | POA: Diagnosis not present

## 2015-07-23 ENCOUNTER — Telehealth: Payer: Self-pay

## 2015-07-23 ENCOUNTER — Encounter: Payer: Medicare Other | Admitting: Sports Medicine

## 2015-07-23 NOTE — Telephone Encounter (Signed)
Pt received Cymbalta in the mail and wants to know how should she taper off of Effexor?

## 2015-07-24 ENCOUNTER — Other Ambulatory Visit: Payer: Self-pay

## 2015-07-24 MED ORDER — VENLAFAXINE HCL ER 37.5 MG PO CP24: 37.5000 mg | ORAL_CAPSULE | Freq: Every day | ORAL | Status: DC

## 2015-07-24 NOTE — Telephone Encounter (Signed)
Transition from Effexor 75mg  daily to 37.5mg  daily then start cymbalta.  A 5 day Rx has been sent to [facility], if she prefers a different pharmacy please transfer the Rx.

## 2015-07-24 NOTE — Telephone Encounter (Signed)
[place] want be able to get the effexor until tomorrow.  Is this ok or do you have any other suggestions.

## 2015-07-25 NOTE — Telephone Encounter (Signed)
It would be perfectly fine to wait until she returns to [facility]

## 2015-07-25 NOTE — Telephone Encounter (Signed)
Pt.notified

## 2015-08-07 ENCOUNTER — Other Ambulatory Visit: Payer: Self-pay | Admitting: Family Medicine

## 2015-08-08 ENCOUNTER — Other Ambulatory Visit: Payer: Self-pay | Admitting: Family Medicine

## 2015-08-28 ENCOUNTER — Encounter: Payer: Self-pay | Admitting: Family Medicine

## 2015-08-28 ENCOUNTER — Ambulatory Visit (INDEPENDENT_AMBULATORY_CARE_PROVIDER_SITE_OTHER): Payer: Medicare Other | Admitting: Family Medicine

## 2015-08-28 VITALS — BP 123/85 | HR 94 | Temp 97.6°F | Wt 218.0 lb

## 2015-08-28 DIAGNOSIS — J329 Chronic sinusitis, unspecified: Secondary | ICD-10-CM | POA: Diagnosis not present

## 2015-08-28 DIAGNOSIS — A499 Bacterial infection, unspecified: Secondary | ICD-10-CM

## 2015-08-28 DIAGNOSIS — B9689 Other specified bacterial agents as the cause of diseases classified elsewhere: Secondary | ICD-10-CM

## 2015-08-28 MED ORDER — LEVOFLOXACIN 500 MG PO TABS: 500.0000 mg | ORAL_TABLET | Freq: Every day | ORAL | Status: DC

## 2015-08-28 NOTE — Progress Notes (Signed)
CC: Katrina Fernandez is a 50 y.o. female is here for Sore Throat   Subjective: HPI:  Facial pressure, sore throat, postnasal drip and hoarseness that's been present for 5-7 days now worsening on a daily basis. Symptoms seem to get worse when lying down and frequent wake her up. Symptoms are still moderately severe in the daytime. No benefit from allergy medication or waking. No other interventions as yet. She started to feel chills and is breaking out in sweatsand  since this illness started her daily sweating new since has gotten worse. She denies chest pain shortness of breath orthopnea or cough. She denies any wheezing. She denies headache or rash   Review Of Systems Outlined In HPI  Past Medical History  Diagnosis Date  . Hyperlipidemia     takes Simvastatin daily  . History of DVT of lower extremity     LEFT LEG --  3/ 2014.Was on a blood transfusion for 6 months  . PONV (postoperative nausea and vomiting)   . Chronic constipation     takes Miralax daily  . Chronic pain syndrome     takes Methadone and Keppra daily  . Depression     takes Effexor daily  . Peripheral edema     takes Lasix daily as needed  . Muscle spasm     takes Zanaflex nightly  . Seasonal allergies     takes Claritin daily  . Pneumonia     hx of-7-8 yrs ago  . History of bronchitis 10+ yrs ago  . History of migraine     last one a month ago  . Weakness     and numbness in right hand  . Trigger finger     right  . Peripheral neuropathy ([place])   . Arthritis     back and knees  . Chronic back pain     buldging,DDD  . GERD (gastroesophageal reflux disease)     not taking anything at the present time  . History of colon polyps     benign  . History of kidney stones   . History of MRSA infection 10 yrs ago    Past Surgical History  Procedure Laterality Date  . Carpal tunnel release Bilateral 2001  . Cholecystectomy    . Laparoscopic gastric banding  2011  . Breast reduction surgery Bilateral  06/07/2013    Procedure: BILATERAL MAMMARY REDUCTION  (BREAST);  Surgeon: Theodoro Kos, DO;  Location: [place];  Service: Plastics;  Laterality: Bilateral;  . Breast reduction surgery Bilateral 06/07/2013    Procedure:  LIPOSUCTION;  Surgeon: Theodoro Kos, DO;  Location: [place];  Service: Plastics;  Laterality: Bilateral;  . Posterior cervical laminectomy  03-01-2001    C7-8 and Exploration C8 perineural cyst  . Placement lumboperitoneal shunt for cerebrospinal fluid diversion away from c8 perineural cyst  09-05-2003  &  12-24-2003    08-31-2003  REMOVAL SHUNT  . Posterior lumbar laminectomy l4-5/  diskectomy l5-s1  05-20-2005  . Re-do decompression laminectomy and fusion l4-5  &  l5-s1  12-20-2006    02-04-2007--  RE-EXPLORATION L4 to S1, I & D LUMBAR WOUND HEMATOMA  . Anterior cervical decomp/discectomy fusion  07-25-2009    C5 -- C6  . Appendectomy  age 79  . Total abdominal hysterectomy  1994    w/  Bilateral Salpinoophorectomy  . Tennis elbow release/nirschel procedure Left 12/13/2014    Procedure: LEFT ELBOW LATERAL EPICONDYLAR DEBRIDEMENT AND OR REPAIR -RECONSTRUCTION ;  Surgeon: Iran Planas, MD;  Location: [facility];  Service: Orthopedics;  Laterality: Left;  . Gastric banding undone  11/2014  . Colonoscopy    . Esophagogastroduodenoscopy    . Spinal cord stimulator insertion N/A 04/26/2015    Procedure: LUMBAR SPINAL CORD STIMULATOR INSERTION;  Surgeon: Clydell Hakim, MD;  Location: [place] [facility];  Service: Neurosurgery;  Laterality: N/A;  LUMBAR SPINAL CORD STIMULATOR INSERTION   Family History  Problem Relation Age of Onset  . Depression    . Colon cancer Father   . Ovarian cancer Maternal Grandmother   . Pancreatic cancer Maternal Grandfather     Social History   Social History  . Marital Status: Married    Spouse Name: N/A  . Number of Children: N/A  . Years of Education: N/A   Occupational History  . Not on file.    Social History Main Topics  . Smoking status: Never Smoker   . Smokeless tobacco: Never Used  . Alcohol Use: No  . Drug Use: No  . Sexual Activity: Yes    Birth Control/ Protection: Surgical   Other Topics Concern  . Not on file   Social History Narrative     Objective: BP 123/85 mmHg  Pulse 94  Temp(Src) 97.6 F (36.4 C) (Oral)  Wt 218 lb (98.884 kg)  General: Alert and Oriented, No Acute Distress HEENT: Pupils equal, round, reactive to light. Conjunctivae clear.  External ears unremarkable, canals clear with intact TMs with appropriate landmarks.  Middle ear appears open without effusion. Pink inferior turbinates.  Moist mucous membranes, pharynx without inflammation nor lesions.  Neck supple without palpable lymphadenopathy nor abnormal masses. Lungs: Clear to auscultation bilaterally, no wheezing/ronchi/rales.  Comfortable work of breathing. Good air movement. Extremities: No peripheral edema.  Strong peripheral pulses.  Mental Status: No depression, anxiety, nor agitation. Skin: Warm and dry.  Assessment & Plan: Katrina Fernandez was seen today for sore throat.  Diagnoses and all orders for this visit:  Bacterial sinusitis -     levofloxacin (LEVAQUIN) 500 MG tablet; Take 1 tablet (500 mg total) by mouth daily.   She severely allergic to penicillins, anaphylaxis once. Started on levofloxacin and consider nasal saline washes.  Return if symptoms worsen or fail to improve.

## 2015-09-06 DIAGNOSIS — M533 Sacrococcygeal disorders, not elsewhere classified: Secondary | ICD-10-CM | POA: Diagnosis not present

## 2015-09-06 DIAGNOSIS — M79609 Pain in unspecified limb: Secondary | ICD-10-CM | POA: Diagnosis not present

## 2015-09-06 DIAGNOSIS — Z79899 Other long term (current) drug therapy: Secondary | ICD-10-CM | POA: Diagnosis not present

## 2015-09-06 DIAGNOSIS — Z5181 Encounter for therapeutic drug level monitoring: Secondary | ICD-10-CM | POA: Diagnosis not present

## 2015-09-06 DIAGNOSIS — M961 Postlaminectomy syndrome, not elsewhere classified: Secondary | ICD-10-CM | POA: Diagnosis not present

## 2015-09-08 ENCOUNTER — Other Ambulatory Visit: Payer: Self-pay | Admitting: Family Medicine

## 2015-10-23 ENCOUNTER — Encounter: Payer: Self-pay | Admitting: Family Medicine

## 2015-10-23 ENCOUNTER — Ambulatory Visit (INDEPENDENT_AMBULATORY_CARE_PROVIDER_SITE_OTHER): Payer: Medicare Other | Admitting: Family Medicine

## 2015-10-23 VITALS — BP 151/99 | HR 110 | Wt 226.0 lb

## 2015-10-23 DIAGNOSIS — G2581 Restless legs syndrome: Secondary | ICD-10-CM | POA: Insufficient documentation

## 2015-10-23 DIAGNOSIS — M13169 Monoarthritis, not elsewhere classified, unspecified knee: Secondary | ICD-10-CM

## 2015-10-23 DIAGNOSIS — J302 Other seasonal allergic rhinitis: Secondary | ICD-10-CM

## 2015-10-23 LAB — POCT GLYCOSYLATED HEMOGLOBIN (HGB A1C): Hemoglobin A1C: 5.8

## 2015-10-23 MED ORDER — PRAMIPEXOLE DIHYDROCHLORIDE 0.125 MG PO TABS: 0.1250 mg | ORAL_TABLET | Freq: Every evening | ORAL | Status: DC

## 2015-10-23 MED ORDER — MONTELUKAST SODIUM 10 MG PO TABS: 10.0000 mg | ORAL_TABLET | Freq: Every day | ORAL | Status: DC

## 2015-10-23 NOTE — Progress Notes (Signed)
CC: Katrina Fernandez is a 50 y.o. female is here for restless legs   Subjective: HPI:  2 weeks ago she started to experience restlessness in her right leg. It's present whenever she is inactive and worse when trying to fall asleep. The only thing different in her life right now is that she is being weaned off of methadone. She denies any other motor or sensory disturbances. She tells me she has extreme sensation that she has to move her right leg at rest she can never keep it still. Symptoms are moderate in severity and keeping her awake at night. No interventions as of yet.  She was another something different and Claritin that she can take for allergies. She's having a runny nose and postnasal drip on a daily basis. Claritin doesn't help. No fevers, chills or cough.  Her last complaint is bilateral knee pain when riding her bicycle. She's had a professional fit for this particular bicycle and has not changed the geometry herself. Pain is localized in the anterior aspect of the knee. No mechanical symptoms. No swelling redness or warmth    Review Of Systems Outlined In HPI  Past Medical History  Diagnosis Date  . Hyperlipidemia     takes Simvastatin daily  . History of DVT of lower extremity     LEFT LEG --  3/ 2014.Was on a blood transfusion for 6 months  . PONV (postoperative nausea and vomiting)   . Chronic constipation     takes Miralax daily  . Chronic pain syndrome     takes Methadone and Keppra daily  . Depression     takes Effexor daily  . Peripheral edema     takes Lasix daily as needed  . Muscle spasm     takes Zanaflex nightly  . Seasonal allergies     takes Claritin daily  . Pneumonia     hx of-7-8 yrs ago  . History of bronchitis 10+ yrs ago  . History of migraine     last one a month ago  . Weakness     and numbness in right hand  . Trigger finger     right  . Peripheral neuropathy ([place])   . Arthritis     back and knees  . Chronic back pain    buldging,DDD  . GERD (gastroesophageal reflux disease)     not taking anything at the present time  . History of colon polyps     benign  . History of kidney stones   . History of MRSA infection 10 yrs ago    Past Surgical History  Procedure Laterality Date  . Carpal tunnel release Bilateral 2001  . Cholecystectomy    . Laparoscopic gastric banding  2011  . Breast reduction surgery Bilateral 06/07/2013    Procedure: BILATERAL MAMMARY REDUCTION  (BREAST);  Surgeon: Theodoro Kos, DO;  Location: [place];  Service: Plastics;  Laterality: Bilateral;  . Breast reduction surgery Bilateral 06/07/2013    Procedure:  LIPOSUCTION;  Surgeon: Theodoro Kos, DO;  Location: [place];  Service: Plastics;  Laterality: Bilateral;  . Posterior cervical laminectomy  03-01-2001    C7-8 and Exploration C8 perineural cyst  . Placement lumboperitoneal shunt for cerebrospinal fluid diversion away from c8 perineural cyst  09-05-2003  &  12-24-2003    08-31-2003  REMOVAL SHUNT  . Posterior lumbar laminectomy l4-5/  diskectomy l5-s1  05-20-2005  . Re-do decompression laminectomy and fusion l4-5  &  l5-s1  12-20-2006    02-04-2007--  RE-EXPLORATION L4 to S1, I & D LUMBAR WOUND HEMATOMA  . Anterior cervical decomp/discectomy fusion  07-25-2009    C5 -- C6  . Appendectomy  age 8  . Total abdominal hysterectomy  1994    w/  Bilateral Salpinoophorectomy  . Tennis elbow release/nirschel procedure Left 12/13/2014    Procedure: LEFT ELBOW LATERAL EPICONDYLAR DEBRIDEMENT AND OR REPAIR -RECONSTRUCTION ;  Surgeon: Iran Planas, MD;  Location: [place];  Service: Orthopedics;  Laterality: Left;  . Gastric banding undone  11/2014  . Colonoscopy    . Esophagogastroduodenoscopy    . Spinal cord stimulator insertion N/A 04/26/2015    Procedure: LUMBAR SPINAL CORD STIMULATOR INSERTION;  Surgeon: Clydell Hakim, MD;  Location: [place] [facility];  Service: Neurosurgery;  Laterality:  N/A;  LUMBAR SPINAL CORD STIMULATOR INSERTION   Family History  Problem Relation Age of Onset  . Depression    . Colon cancer Father   . Ovarian cancer Maternal Grandmother   . Pancreatic cancer Maternal Grandfather     Social History   Social History  . Marital Status: Married    Spouse Name: N/A  . Number of Children: N/A  . Years of Education: N/A   Occupational History  . Not on file.   Social History Main Topics  . Smoking status: Never Smoker   . Smokeless tobacco: Never Used  . Alcohol Use: No  . Drug Use: No  . Sexual Activity: Yes    Birth Control/ Protection: Surgical   Other Topics Concern  . Not on file   Social History Narrative     Objective: BP 151/99 mmHg  Pulse 110  Wt 226 lb (102.513 kg)  General: Alert and Oriented, No Acute Distress HEENT: Pupils equal, round, reactive to light. Conjunctivae clear.  Moist mucous membranes Lungs: Clear to auscultation bilaterally, no wheezing/ronchi/rales.  Comfortable work of breathing. Good air movement. Cardiac: Regular rate and rhythm. Normal S1/S2.  No murmurs, rubs, nor gallops.   Extremities: No peripheral edema.  Strong peripheral pulses.  full range of motion and strength in both knees without any swelling.  Mental Status: No depression, anxiety, nor agitation. Skin: Warm and dry.  Assessment & Plan: Katrina Fernandez was seen today for restless legs.  Diagnoses and all orders for this visit:  RLS (restless legs syndrome) -     POCT HgB A1C  Seasonal allergies  Patellofemoral arthritis, unspecified laterality  Other orders -     pramipexole (MIRAPEX) 0.125 MG tablet; Take 1 tablet (0.125 mg total) by mouth every evening. -     montelukast (SINGULAIR) 10 MG tablet; Take 1 tablet (10 mg total) by mouth at bedtime.   RLS: She is worried that she has diabetes and that's what's causing her tremor, checking an A1c to calm her nerves but I think Mirapex up to the best solution. Patellofemoral arthritis: She  was given home rehabilitation exercises to do for the next 2-3 weeks. Seasonal allergies: Uncontrolled with Claritin starting singular.  Return in about 4 weeks (around 11/20/2015).

## 2015-10-24 ENCOUNTER — Telehealth: Payer: Self-pay

## 2015-10-24 NOTE — Telephone Encounter (Signed)
Pt report that she tried taking 1 tablet of the pramipexole but it didn't help and noticed an improvement after taking 2.

## 2015-10-25 MED ORDER — PRAMIPEXOLE DIHYDROCHLORIDE 0.125 MG PO TABS: 0.2500 mg | ORAL_TABLET | Freq: Every evening | ORAL | Status: DC

## 2015-10-25 NOTE — Telephone Encounter (Signed)
Rx at [facility] in [place] has been updated.

## 2015-10-25 NOTE — Telephone Encounter (Signed)
Pt.notified

## 2015-10-28 ENCOUNTER — Encounter: Payer: Self-pay | Admitting: Family Medicine

## 2015-10-28 DIAGNOSIS — M533 Sacrococcygeal disorders, not elsewhere classified: Secondary | ICD-10-CM | POA: Diagnosis not present

## 2015-10-28 DIAGNOSIS — M961 Postlaminectomy syndrome, not elsewhere classified: Secondary | ICD-10-CM | POA: Diagnosis not present

## 2015-10-28 DIAGNOSIS — Z79899 Other long term (current) drug therapy: Secondary | ICD-10-CM | POA: Diagnosis not present

## 2015-10-28 DIAGNOSIS — M79609 Pain in unspecified limb: Secondary | ICD-10-CM | POA: Diagnosis not present

## 2015-11-01 ENCOUNTER — Telehealth: Payer: Self-pay

## 2015-11-01 DIAGNOSIS — Z9689 Presence of other specified functional implants: Secondary | ICD-10-CM | POA: Insufficient documentation

## 2015-11-01 DIAGNOSIS — G8929 Other chronic pain: Secondary | ICD-10-CM

## 2015-11-01 DIAGNOSIS — M549 Dorsalgia, unspecified: Principal | ICD-10-CM

## 2015-11-01 NOTE — Telephone Encounter (Signed)
Pt.notified

## 2015-11-01 NOTE — Telephone Encounter (Signed)
Pt is currently seen at [place] [facility] and is not happy with the services.  She is wanting to go to [facility]. Please advise.

## 2015-11-01 NOTE — Telephone Encounter (Signed)
Will you please let patient know that a referral to [facility] has been placed.

## 2015-11-06 ENCOUNTER — Other Ambulatory Visit: Payer: Self-pay | Admitting: Family Medicine

## 2015-11-06 ENCOUNTER — Telehealth: Payer: Self-pay

## 2015-11-06 DIAGNOSIS — G2581 Restless legs syndrome: Secondary | ICD-10-CM

## 2015-11-06 NOTE — Telephone Encounter (Signed)
I'd like to rule out an iron deficiency with some labs (in your in box)

## 2015-11-06 NOTE — Telephone Encounter (Signed)
Pt.notified

## 2015-11-06 NOTE — Telephone Encounter (Signed)
Pt reports that her restless legs is through out the day and not just at night.  Do you have any suggestions on how to help with this problem?

## 2015-11-11 DIAGNOSIS — G2581 Restless legs syndrome: Secondary | ICD-10-CM | POA: Diagnosis not present

## 2015-11-11 DIAGNOSIS — D649 Anemia, unspecified: Secondary | ICD-10-CM | POA: Diagnosis not present

## 2015-11-12 ENCOUNTER — Telehealth: Payer: Self-pay | Admitting: Family Medicine

## 2015-11-12 LAB — IRON AND TIBC
%SAT: 14 % (ref 11–50)
Iron: 57 ug/dL (ref 40–190)
TIBC: 406 ug/dL (ref 250–450)
UIBC: 349 ug/dL (ref 125–400)

## 2015-11-12 LAB — FERRITIN: Ferritin: 12 ng/mL (ref 10–232)

## 2015-11-12 MED ORDER — FERROUS SULFATE 325 (65 FE) MG PO TBEC: DELAYED_RELEASE_TABLET | ORAL | Status: DC

## 2015-11-12 NOTE — Telephone Encounter (Signed)
Will you please let patient know that her iron level was low which could be causing her restless legs. I'd recommend starting an OTC iron sulfate supplement at a dose of 325mg  one to twice a day as tolerated by constipation.

## 2015-11-12 NOTE — Telephone Encounter (Signed)
Results and recommendations left on vm pt advised to call with any questions

## 2015-11-27 ENCOUNTER — Ambulatory Visit (INDEPENDENT_AMBULATORY_CARE_PROVIDER_SITE_OTHER): Payer: Medicare Other | Admitting: Family Medicine

## 2015-11-27 ENCOUNTER — Telehealth: Payer: Self-pay | Admitting: Family Medicine

## 2015-11-27 ENCOUNTER — Encounter: Payer: Self-pay | Admitting: Family Medicine

## 2015-11-27 VITALS — BP 124/85 | HR 104 | Wt 222.0 lb

## 2015-11-27 DIAGNOSIS — M79661 Pain in right lower leg: Secondary | ICD-10-CM

## 2015-11-27 DIAGNOSIS — R7989 Other specified abnormal findings of blood chemistry: Secondary | ICD-10-CM

## 2015-11-27 LAB — D-DIMER, QUANTITATIVE: D-Dimer, Quant: 0.81 mcg/mL FEU — ABNORMAL HIGH (ref <0.50)

## 2015-11-27 NOTE — Progress Notes (Signed)
CC: Katrina Fernandez is a 50 y.o. female is here for Leg Pain   Subjective: HPI:  For the past 3 days she's noticed some right calf pain that she described as a dull sensation in the back of the calf. Nothing seems to make it better or worse. Mild in severity it has not been getting better or worse since onset. She's been working out like usual and denies exertion. She denies any recent injury. 2 weeks ago she was in her car for a 12 hour journey but she denies any other long periods of immobility. She's had clots in the past and the pain/discomfort feels similar to those in the past. She denies any chest pain shortness of breath or any bleeding. No interventions as of yet. Denies any swelling or color change on the skin in the right leg.   Review Of Systems Outlined In HPI  Past Medical History  Diagnosis Date  . Hyperlipidemia     takes Simvastatin daily  . History of DVT of lower extremity     LEFT LEG --  3/ 2014.Was on a blood transfusion for 6 months  . PONV (postoperative nausea and vomiting)   . Chronic constipation     takes Miralax daily  . Chronic pain syndrome     takes Methadone and Keppra daily  . Depression     takes Effexor daily  . Peripheral edema     takes Lasix daily as needed  . Muscle spasm     takes Zanaflex nightly  . Seasonal allergies     takes Claritin daily  . Pneumonia     hx of-7-8 yrs ago  . History of bronchitis 10+ yrs ago  . History of migraine     last one a month ago  . Weakness     and numbness in right hand  . Trigger finger     right  . Peripheral neuropathy ([place])   . Arthritis     back and knees  . Chronic back pain     buldging,DDD  . GERD (gastroesophageal reflux disease)     not taking anything at the present time  . History of colon polyps     benign  . History of kidney stones   . History of MRSA infection 10 yrs ago    Past Surgical History  Procedure Laterality Date  . Carpal tunnel release Bilateral 2001  .  Cholecystectomy    . Laparoscopic gastric banding  2011  . Breast reduction surgery Bilateral 06/07/2013    Procedure: BILATERAL MAMMARY REDUCTION  (BREAST);  Surgeon: Theodoro Kos, DO;  Location: [place];  Service: Plastics;  Laterality: Bilateral;  . Breast reduction surgery Bilateral 06/07/2013    Procedure:  LIPOSUCTION;  Surgeon: Theodoro Kos, DO;  Location: [place];  Service: Plastics;  Laterality: Bilateral;  . Posterior cervical laminectomy  03-01-2001    C7-8 and Exploration C8 perineural cyst  . Placement lumboperitoneal shunt for cerebrospinal fluid diversion away from c8 perineural cyst  09-05-2003  &  12-24-2003    08-31-2003  REMOVAL SHUNT  . Posterior lumbar laminectomy l4-5/  diskectomy l5-s1  05-20-2005  . Re-do decompression laminectomy and fusion l4-5  &  l5-s1  12-20-2006    02-04-2007--  RE-EXPLORATION L4 to S1, I & D LUMBAR WOUND HEMATOMA  . Anterior cervical decomp/discectomy fusion  07-25-2009    C5 -- C6  . Appendectomy  age 41  . Total abdominal hysterectomy  1994  w/  Bilateral Salpinoophorectomy  . Tennis elbow release/nirschel procedure Left 12/13/2014    Procedure: LEFT ELBOW LATERAL EPICONDYLAR DEBRIDEMENT AND OR REPAIR -RECONSTRUCTION ;  Surgeon: Iran Planas, MD;  Location: [place];  Service: Orthopedics;  Laterality: Left;  . Gastric banding undone  11/2014  . Colonoscopy    . Esophagogastroduodenoscopy    . Spinal cord stimulator insertion N/A 04/26/2015    Procedure: LUMBAR SPINAL CORD STIMULATOR INSERTION;  Surgeon: Clydell Hakim, MD;  Location: [place] [facility];  Service: Neurosurgery;  Laterality: N/A;  LUMBAR SPINAL CORD STIMULATOR INSERTION   Family History  Problem Relation Age of Onset  . Depression    . Colon cancer Father   . Ovarian cancer Maternal Grandmother   . Pancreatic cancer Maternal Grandfather     Social History   Social History  . Marital Status: Married    Spouse Name: N/A   . Number of Children: N/A  . Years of Education: N/A   Occupational History  . Not on file.   Social History Main Topics  . Smoking status: Never Smoker   . Smokeless tobacco: Never Used  . Alcohol Use: No  . Drug Use: No  . Sexual Activity: Yes    Birth Control/ Protection: Surgical   Other Topics Concern  . Not on file   Social History Narrative     Objective: BP 124/85 mmHg  Pulse 104  Wt 222 lb (100.699 kg)  Vital signs reviewed. General: Alert and Oriented, No Acute Distress HEENT: Pupils equal, round, reactive to light. Conjunctivae clear.  External ears unremarkable.  Moist mucous membranes. Lungs: Clear and comfortable work of breathing, speaking in full sentences without accessory muscle use. Cardiac: Regular rate and rhythm.  Neuro: CN II-XII grossly intact, gait normal. Extremities: No peripheral edema.  Strong peripheral pulses. Unable to palpate any abnormality in the right calf, unable to change the character of her pain when dorsiflexing the foot or palpating the calf. Mental Status: No depression, anxiety, nor agitation. Logical though process. Skin: Warm and dry. No discoloration in the right lower extremity  Assessment & Plan: Katrina Fernandez was seen today for leg pain.  Diagnoses and all orders for this visit:  Right calf pain -     D-dimer, quantitative (not at [facility])   Blood clot is not on the top of my differential however will rule this out with a d-dimer today and if positive will go to ultrasound, patient is in agreement.Signs and symptoms requring emergent/urgent reevaluation were discussed with the patient.  Return if symptoms worsen or fail to improve.

## 2015-11-27 NOTE — Telephone Encounter (Signed)
D-dimer was elevated and called to me as the on call doctor.  I discussed the result with the ordering physician Dr Ileene Rubens.  Plan: Dr Ileene Rubens will order a [place] tomorrow morning.

## 2015-11-27 NOTE — Telephone Encounter (Signed)
Katrina Fernandez, I already called Monika tonight about her positive D-Dimer. I'd like her to have a venous ultrasound at [facility] tomorrow morning. The order has been placed, can you please make this your top priority tomorrow morning to make sure everything get scheduled appropriately?

## 2015-11-28 ENCOUNTER — Ambulatory Visit (HOSPITAL_BASED_OUTPATIENT_CLINIC_OR_DEPARTMENT_OTHER)
Admission: RE | Admit: 2015-11-28 | Discharge: 2015-11-28 | Disposition: A | Discharge status: Home / Self Care | Payer: Medicare Other | Source: Ambulatory Visit | Attending: Family Medicine | Admitting: Family Medicine

## 2015-11-28 DIAGNOSIS — R791 Abnormal coagulation profile: Secondary | ICD-10-CM | POA: Diagnosis not present

## 2015-11-28 DIAGNOSIS — M79661 Pain in right lower leg: Secondary | ICD-10-CM | POA: Insufficient documentation

## 2015-11-28 NOTE — Telephone Encounter (Signed)
Called over to [facility] and they called pt to make appointment

## 2015-11-29 ENCOUNTER — Telehealth: Payer: Self-pay | Admitting: Sports Medicine

## 2015-11-29 NOTE — Telephone Encounter (Signed)
Called by nurse line to report elevated d-dimer, this is already been managed by Dr. Ileene Rubens, she presented with Pain, and had a negative venous Doppler ultrasound. No further intervention needed, no evidence of chest pain or shortness of breath and the documentation to suggest need for CT angiogram of the pulmonary arteries.

## 2015-12-05 ENCOUNTER — Ambulatory Visit (INDEPENDENT_AMBULATORY_CARE_PROVIDER_SITE_OTHER): Payer: Medicare Other | Admitting: Sports Medicine

## 2015-12-05 ENCOUNTER — Encounter: Payer: Self-pay | Admitting: Sports Medicine

## 2015-12-05 VITALS — BP 103/68 | HR 89 | Resp 18 | Wt 224.3 lb

## 2015-12-05 DIAGNOSIS — M170 Bilateral primary osteoarthritis of knee: Secondary | ICD-10-CM

## 2015-12-05 NOTE — Progress Notes (Signed)
  Subjective:    CC: Right knee pain  HPI: This is a pleasant 50 year old female with known knee osteoarthritis, she fell on the shower, and has a recurrence of pain in the right knee, localized at the joint line, moderate, persistent with stiffness, no mechanical symptoms. No swelling. She really just desires another injection. We finished Orthovisc several years ago.  Past medical history, Surgical history, Family history not pertinant except as noted below, Social history, Allergies, and medications have been entered into the medical record, reviewed, and no changes needed.   Review of Systems: No fevers, chills, night sweats, weight loss, chest pain, or shortness of breath.   Objective:    General: Well Developed, well nourished, and in no acute distress.  Neuro: Alert and oriented x3, extra-ocular muscles intact, sensation grossly intact.  HEENT: Normocephalic, atraumatic, pupils equal round reactive to light, neck supple, no masses, no lymphadenopathy, thyroid nonpalpable.  Skin: Warm and dry, no rashes. Cardiac: Regular rate and rhythm, no murmurs rubs or gallops, no lower extremity edema.  Respiratory: Clear to auscultation bilaterally. Not using accessory muscles, speaking in full sentences. Right Knee: Normal to inspection with no erythema or effusion or obvious bony abnormalities. Tender to palpation at the medial joint line. ROM normal in flexion and extension and lower leg rotation. Ligaments with solid consistent endpoints including ACL, PCL, LCL, MCL. Negative Mcmurray's and provocative meniscal tests. Non painful patellar compression. Patellar and quadriceps tendons unremarkable. Hamstring and quadriceps strength is normal.  Procedure: Real-time Ultrasound Guided Injection of right knee Device: GE Logiq E  Verbal informed consent obtained.  Time-out conducted.  Noted no overlying erythema, induration, or other signs of local infection.  Skin prepped in a sterile  fashion.  Local anesthesia: Topical Ethyl chloride.  With sterile technique and under real time ultrasound guidance:  1 mL Kenalog 40, 2 mL lidocaine, 2 mL Marcaine injected easily. Completed without difficulty  Pain immediately resolved suggesting accurate placement of the medication.  Advised to call if fevers/chills, erythema, induration, drainage, or persistent bleeding.  Images permanently stored and available for review in the ultrasound unit.  Impression: Technically successful ultrasound guided injection.  Impression and Recommendations:   I spent 25 minutes with this patient, greater than 50% was face-to-face time counseling regarding the above diagnoses, this time was separate from the time spent performing the procedure.

## 2015-12-05 NOTE — Assessment & Plan Note (Signed)
Orthovisc series ended approximately 2 years ago. Had a fall with a recurrence of pain in the right knee, classic for osteoarthritis type pain. Steroid injection as above, return if no better in one month.

## 2015-12-23 DIAGNOSIS — M533 Sacrococcygeal disorders, not elsewhere classified: Secondary | ICD-10-CM | POA: Diagnosis not present

## 2015-12-23 DIAGNOSIS — M79609 Pain in unspecified limb: Secondary | ICD-10-CM | POA: Diagnosis not present

## 2015-12-23 DIAGNOSIS — Z79899 Other long term (current) drug therapy: Secondary | ICD-10-CM | POA: Diagnosis not present

## 2015-12-23 DIAGNOSIS — M961 Postlaminectomy syndrome, not elsewhere classified: Secondary | ICD-10-CM | POA: Diagnosis not present

## 2015-12-27 ENCOUNTER — Encounter: Payer: Self-pay | Admitting: Family Medicine

## 2016-01-03 MED ORDER — PRAMIPEXOLE DIHYDROCHLORIDE 0.5 MG PO TABS: 0.5000 mg | ORAL_TABLET | Freq: Every evening | ORAL | 0 refills | Status: DC

## 2016-01-03 NOTE — Addendum Note (Signed)
Addended by: Marcial Pacas on: 01/03/2016 02:08 PM   Modules accepted: Orders

## 2016-02-03 ENCOUNTER — Other Ambulatory Visit: Payer: Self-pay | Admitting: Family Medicine

## 2016-02-13 ENCOUNTER — Other Ambulatory Visit: Payer: Self-pay | Admitting: Family Medicine

## 2016-02-17 DIAGNOSIS — M961 Postlaminectomy syndrome, not elsewhere classified: Secondary | ICD-10-CM | POA: Diagnosis not present

## 2016-02-17 DIAGNOSIS — M7712 Lateral epicondylitis, left elbow: Secondary | ICD-10-CM | POA: Diagnosis not present

## 2016-02-17 DIAGNOSIS — M533 Sacrococcygeal disorders, not elsewhere classified: Secondary | ICD-10-CM | POA: Diagnosis not present

## 2016-02-17 DIAGNOSIS — G894 Chronic pain syndrome: Secondary | ICD-10-CM | POA: Diagnosis not present

## 2016-02-19 ENCOUNTER — Other Ambulatory Visit: Payer: Self-pay | Admitting: Family Medicine

## 2016-02-20 ENCOUNTER — Other Ambulatory Visit: Payer: Self-pay | Admitting: Family Medicine

## 2016-02-20 ENCOUNTER — Encounter: Payer: Self-pay | Admitting: Family Medicine

## 2016-02-20 ENCOUNTER — Ambulatory Visit (INDEPENDENT_AMBULATORY_CARE_PROVIDER_SITE_OTHER): Payer: Medicare Other | Admitting: Family Medicine

## 2016-02-20 VITALS — BP 132/92 | HR 99 | Wt 218.0 lb

## 2016-02-20 DIAGNOSIS — F411 Generalized anxiety disorder: Secondary | ICD-10-CM

## 2016-02-20 DIAGNOSIS — D508 Other iron deficiency anemias: Secondary | ICD-10-CM | POA: Diagnosis not present

## 2016-02-20 DIAGNOSIS — Z131 Encounter for screening for diabetes mellitus: Secondary | ICD-10-CM | POA: Diagnosis not present

## 2016-02-20 MED ORDER — BUSPIRONE HCL 10 MG PO TABS: 10.0000 mg | ORAL_TABLET | Freq: Three times a day (TID) | ORAL | 1 refills | Status: DC | PRN

## 2016-02-20 NOTE — Patient Instructions (Signed)
Thank you for coming in today. Add buspar.  You should hear from Psychiatry and Psychology.   Return in 1 month.   Generalized Anxiety Disorder Generalized anxiety disorder (GAD) is a mental disorder. It interferes with life functions, including relationships, work, and school. GAD is different from normal anxiety, which everyone experiences at some point in their lives in response to specific life events and activities. Normal anxiety actually helps [place] prepare for and get through these life events and activities. Normal anxiety goes away after the event or activity is over.  GAD causes anxiety that is not necessarily related to specific events or activities. It also causes excess anxiety in proportion to specific events or activities. The anxiety associated with GAD is also difficult to control. GAD can vary from mild to severe. People with severe GAD can have intense waves of anxiety with physical symptoms (panic attacks).  SYMPTOMS The anxiety and worry associated with GAD are difficult to control. This anxiety and worry are related to many life events and activities and also occur more days than not for 6 months or longer. People with GAD also have three or more of the following symptoms (one or more in children):  Restlessness.   Fatigue.  Difficulty concentrating.   Irritability.  Muscle tension.  Difficulty sleeping or unsatisfying sleep. DIAGNOSIS GAD is diagnosed through an assessment by your health care provider. Your health care provider will ask you questions aboutyour mood,physical symptoms, and events in your life. Your health care provider may ask you about your medical history and use of alcohol or drugs, including prescription medicines. Your health care provider may also do a physical exam and blood tests. Certain medical conditions and the use of certain substances can cause symptoms similar to those associated with GAD. Your health care provider may refer you to a  mental health specialist for further evaluation. TREATMENT The following therapies are usually used to treat GAD:   Medication. Antidepressant medication usually is prescribed for long-term daily control. Antianxiety medicines may be added in severe cases, especially when panic attacks occur.   Talk therapy (psychotherapy). Certain types of talk therapy can be helpful in treating GAD by providing support, education, and guidance. A form of talk therapy called cognitive behavioral therapy can teach you healthy ways to think about and react to daily life events and activities.  Stress managementtechniques. These include yoga, meditation, and exercise and can be very helpful when they are practiced regularly. A mental health specialist can help determine which treatment is best for you. Some people see improvement with one therapy. However, other people require a combination of therapies.   This information is not intended to replace advice given to you by your health care provider. Make sure you discuss any questions you have with your health care provider.   Document Released: 08/29/2012 Document Revised: 05/25/2014 Document Reviewed: 08/29/2012 Elsevier Interactive Patient Education [employer].

## 2016-02-20 NOTE — Progress Notes (Signed)
Katrina Fernandez is a 50 y.o. female who presents to [place]: [place] today for worsening anxiety. He has a history of depression and anxiety currently controlled with duloxetine total of 120 mg daily.  She has recently she's had increased life stressors at home and her anxiety symptoms have worsened. She notes significant worrying and trouble sleeping. She denies any SI or HI.  Additionally she notes she previously had iron stores tested that were low and was started on oral iron. She has not had these retested recently.   Past Medical History:  Diagnosis Date  . Arthritis    back and knees  . Chronic back pain    buldging,DDD  . Chronic constipation    takes Miralax daily  . Chronic pain syndrome    takes Methadone and Keppra daily  . Depression    takes Effexor daily  . GERD (gastroesophageal reflux disease)    not taking anything at the present time  . History of bronchitis 10+ yrs ago  . History of colon polyps    benign  . History of DVT of lower extremity    LEFT LEG --  3/ 2014.Was on a blood transfusion for 6 months  . History of kidney stones   . History of migraine    last one a month ago  . History of MRSA infection 10 yrs ago  . Hyperlipidemia    takes Simvastatin daily  . Muscle spasm    takes Zanaflex nightly  . Peripheral edema    takes Lasix daily as needed  . Peripheral neuropathy ([place])   . Pneumonia    hx of-7-8 yrs ago  . PONV (postoperative nausea and vomiting)   . Seasonal allergies    takes Claritin daily  . Trigger finger    right  . Weakness    and numbness in right hand   Past Surgical History:  Procedure Laterality Date  . ANTERIOR CERVICAL DECOMP/DISCECTOMY FUSION  07-25-2009   C5 -- C6  . APPENDECTOMY  age 12  . BREAST REDUCTION SURGERY Bilateral 06/07/2013   Procedure: BILATERAL MAMMARY REDUCTION  (BREAST);  Surgeon:  Theodoro Kos, DO;  Location: [place];  Service: Plastics;  Laterality: Bilateral;  . BREAST REDUCTION SURGERY Bilateral 06/07/2013   Procedure:  LIPOSUCTION;  Surgeon: Theodoro Kos, DO;  Location: [place];  Service: Plastics;  Laterality: Bilateral;  . CARPAL TUNNEL RELEASE Bilateral 2001  . CHOLECYSTECTOMY    . COLONOSCOPY    . ESOPHAGOGASTRODUODENOSCOPY    . gastric banding undone  11/2014  . LAPAROSCOPIC GASTRIC BANDING  2011  . PLACEMENT LUMBOPERITONEAL SHUNT FOR CEREBROSPINAL FLUID DIVERSION AWAY FROM C8 PERINEURAL CYST  09-05-2003  &  12-24-2003   08-31-2003  REMOVAL SHUNT  . POSTERIOR CERVICAL LAMINECTOMY  03-01-2001   C7-8 and Exploration C8 perineural cyst  . POSTERIOR LUMBAR LAMINECTOMY L4-5/  DISKECTOMY L5-S1  05-20-2005  . RE-DO DECOMPRESSION LAMINECTOMY AND FUSION L4-5  &  L5-S1  12-20-2006   02-04-2007--  RE-EXPLORATION L4 to S1, I & D LUMBAR WOUND HEMATOMA  . SPINAL CORD STIMULATOR INSERTION N/A 04/26/2015   Procedure: LUMBAR SPINAL CORD STIMULATOR INSERTION;  Surgeon: Clydell Hakim, MD;  Location: [place] [facility];  Service: Neurosurgery;  Laterality: N/A;  LUMBAR SPINAL CORD STIMULATOR INSERTION  . TENNIS ELBOW RELEASE/NIRSCHEL PROCEDURE Left 12/13/2014   Procedure: LEFT ELBOW LATERAL EPICONDYLAR DEBRIDEMENT AND OR REPAIR -RECONSTRUCTION ;  Surgeon: Iran Planas, MD;  Location: [place];  Service: Orthopedics;  Laterality: Left;  . TOTAL ABDOMINAL HYSTERECTOMY  1994   w/  Bilateral Salpinoophorectomy   Social History  Substance Use Topics  . Smoking status: Never Smoker  . Smokeless tobacco: Never Used  . Alcohol use No   family history includes Colon cancer in her father; Ovarian cancer in her maternal grandmother; Pancreatic cancer in her maternal grandfather.  ROS as above:  Medications: Current Outpatient Prescriptions  Medication Sig Dispense Refill  . DULoxetine (CYMBALTA) 60 MG capsule Take 2 capsules by mouth   daily 180 capsule 1  . furosemide (LASIX) 20 MG tablet TAKE 2 TABS BY EVERY MORNING AND 1 TAB EVERY AFTERNOON,ONLY AS NEEDED FOR LEG SWELLING (Patient taking differently: TAKE 1 TAB BY EVERY MORNING , FOR LEG SWELLING) 90 tablet 1  . levETIRAcetam (KEPPRA) 500 MG tablet Take 500 mg by mouth 2 (two) times daily.     . methadone (DOLOPHINE) 5 MG tablet Take 5 mg by mouth 4 (four) times daily.  0  . montelukast (SINGULAIR) 10 MG tablet Take 1 tablet (10 mg total) by mouth at bedtime. NEED FOLLOW UP VISIT FOR MORE REFILLS 30 tablet 0  . pramipexole (MIRAPEX) 0.5 MG tablet Take 1 tablet (0.5 mg total) by mouth every evening. 90 tablet 0  . simvastatin (ZOCOR) 20 MG tablet Take 1 tablet (20 mg total) by mouth daily. NEED FOLLOW UP VISIT FOR MORE REFILLS 30 tablet 0  . tiZANidine (ZANAFLEX) 4 MG capsule Take 4-8 mg by mouth 2 (two) times daily. ONE TAB AM - PRN AND 2 TABS AT BEDTIME    . busPIRone (BUSPAR) 10 MG tablet Take 1 tablet (10 mg total) by mouth 3 (three) times daily as needed. 90 tablet 1  . cloNIDine (CATAPRES) 0.1 MG tablet Take by mouth.    . furosemide (LASIX) 20 MG tablet TAKE 2 TABLETS BY MOUTH EVERY MORNING AND 1 TABLET EVERY AFTEROON, ONLY AS NEEDED FOR LEG SWELLING 90 tablet 0   No current facility-administered medications for this visit.    Allergies  Allergen Reactions  . Penicillins Anaphylaxis  . Lactose Rash and Swelling  . Other Swelling and Rash    Silicone Tape  . Tape Rash    Plastic tape  . Chlorhexidine     Dermatitis - Allergic     Exam:  BP (!) 132/92   Pulse 99   Wt 218 lb (98.9 kg)   BMI 38.62 kg/m  Gen: Well NAD HEENT: EOMI,  MMM Lungs: Normal work of breathing. CTABL Heart: RRR no MRG Abd: NABS, Soft. Nondistended, Nontender Exts: Brisk capillary refill, warm and well perfused.  Psych: Alert and oriented normal speech thought process and affect.   GAD 7 : Generalized Anxiety Score 02/20/2016  Nervous, Anxious, on Edge 3  Control/stop worrying 3    Worry too much - different things 3  Trouble relaxing 2  Restless 2  Easily annoyed or irritable 1  Afraid - awful might happen 0  Total GAD 7 Score 14  Anxiety Difficulty Somewhat difficult    Depression screen PHQ 2/9 02/20/2016  Decreased Interest 0  Down, Depressed, Hopeless 2  PHQ - 2 Score 2  Altered sleeping 3  Tired, decreased energy 1  Change in appetite 1  Feeling bad or failure about yourself  0  Trouble concentrating 0  Moving slowly or fidgety/restless 0  Suicidal thoughts 0  PHQ-9 Score 7  Difficult doing work/chores Somewhat difficult  No results found for this or any previous visit (from the past 24 hour(s)). No results found.    Assessment and Plan: 50 y.o. female with worsening anxiety. This is likely mostly due to life stressors. Plan to refer to counseling and add buspirone. Refer to psychiatry as well as if symptoms do not improve she'll likely need more help. We'll check thyroid function as well as this may be related to her mood disorder. Recheck in the near future.  Additionally we'll recheck iron labs for history of iron deficiency anemia.   Orders Placed This Encounter  Procedures  . CBC  . COMPLETE METABOLIC PANEL WITH GFR  . T4, free  . T3, free  . TSH  . Hemoglobin A1c  . Ferritin  . Iron and TIBC  . Ambulatory referral to Psychiatry    Referral Priority:   Routine    Referral Type:   Psychiatric    Referral Reason:   Specialty Services Required    Requested Specialty:   Psychiatry    Number of Visits Requested:   1  . Ambulatory referral to Psychology    Referral Priority:   Routine    Referral Type:   Psychiatric    Referral Reason:   Specialty Services Required    Requested Specialty:   Psychology    Number of Visits Requested:   1    Discussed warning signs or symptoms. Please see discharge instructions. Patient expresses understanding.

## 2016-02-21 ENCOUNTER — Encounter: Payer: Self-pay | Admitting: Family Medicine

## 2016-02-21 ENCOUNTER — Other Ambulatory Visit: Payer: Self-pay | Admitting: Family Medicine

## 2016-02-21 DIAGNOSIS — R7303 Prediabetes: Secondary | ICD-10-CM | POA: Insufficient documentation

## 2016-02-21 LAB — IRON AND TIBC
%SAT: 12 % (ref 11–50)
Iron: 47 ug/dL (ref 40–190)
TIBC: 384 ug/dL (ref 250–450)
UIBC: 337 ug/dL (ref 125–400)

## 2016-02-21 LAB — CBC
HCT: 44.7 % (ref 35.0–45.0)
Hemoglobin: 15 g/dL (ref 11.7–15.5)
MCH: 28.8 pg (ref 27.0–33.0)
MCHC: 33.6 g/dL (ref 32.0–36.0)
MCV: 85.8 fL (ref 80.0–100.0)
MPV: 10.9 fL (ref 7.5–12.5)
Platelets: 327 10*3/uL (ref 140–400)
RBC: 5.21 MIL/uL — ABNORMAL HIGH (ref 3.80–5.10)
RDW: 14.1 % (ref 11.0–15.0)
WBC: 9.8 10*3/uL (ref 3.8–10.8)

## 2016-02-21 LAB — COMPLETE METABOLIC PANEL WITH GFR
ALT: 13 U/L (ref 6–29)
AST: 15 U/L (ref 10–35)
Albumin: 4.5 g/dL (ref 3.6–5.1)
Alkaline Phosphatase: 141 U/L — ABNORMAL HIGH (ref 33–115)
BUN: 15 mg/dL (ref 7–25)
CO2: 31 mmol/L (ref 20–31)
Calcium: 9.9 mg/dL (ref 8.6–10.2)
Chloride: 101 mmol/L (ref 98–110)
Creat: 0.81 mg/dL (ref 0.50–1.10)
GFR, Est African American: >89 mL/min (ref >=60)
GFR, Est Non African American: 86 mL/min (ref >=60)
Glucose, Bld: 76 mg/dL (ref 65–99)
Potassium: 4.2 mmol/L (ref 3.5–5.3)
Sodium: 144 mmol/L (ref 135–146)
Total Bilirubin: 0.3 mg/dL (ref 0.2–1.2)
Total Protein: 7.3 g/dL (ref 6.1–8.1)

## 2016-02-21 LAB — TSH: TSH: 0.51 mIU/L

## 2016-02-21 LAB — T3, FREE: T3, Free: 3.2 pg/mL (ref 2.3–4.2)

## 2016-02-21 LAB — T4, FREE: Free T4: 1.1 ng/dL (ref 0.8–1.8)

## 2016-02-21 LAB — FERRITIN: Ferritin: 52 ng/mL (ref 10–232)

## 2016-02-21 LAB — HEMOGLOBIN A1C
Hgb A1c MFr Bld: 5.7 % — ABNORMAL HIGH (ref <5.7)
Mean Plasma Glucose: 117 mg/dL

## 2016-03-16 ENCOUNTER — Other Ambulatory Visit: Payer: Self-pay | Admitting: Family Medicine

## 2016-03-19 ENCOUNTER — Ambulatory Visit (HOSPITAL_COMMUNITY): Payer: Medicare Other | Admitting: Licensed Clinical Social Worker

## 2016-03-23 ENCOUNTER — Ambulatory Visit: Payer: Medicare Other | Admitting: Family Medicine

## 2016-03-25 ENCOUNTER — Encounter: Payer: Self-pay | Admitting: Family Medicine

## 2016-03-25 ENCOUNTER — Ambulatory Visit (INDEPENDENT_AMBULATORY_CARE_PROVIDER_SITE_OTHER): Payer: Medicare Other | Admitting: Family Medicine

## 2016-03-25 ENCOUNTER — Ambulatory Visit (INDEPENDENT_AMBULATORY_CARE_PROVIDER_SITE_OTHER): Payer: Medicare Other

## 2016-03-25 VITALS — BP 137/96 | HR 107 | Temp 98.8°F

## 2016-03-25 DIAGNOSIS — R05 Cough: Secondary | ICD-10-CM

## 2016-03-25 DIAGNOSIS — F411 Generalized anxiety disorder: Secondary | ICD-10-CM

## 2016-03-25 DIAGNOSIS — J209 Acute bronchitis, unspecified: Secondary | ICD-10-CM

## 2016-03-25 DIAGNOSIS — F3341 Major depressive disorder, recurrent, in partial remission: Secondary | ICD-10-CM | POA: Diagnosis not present

## 2016-03-25 MED ORDER — ALBUTEROL SULFATE HFA 108 (90 BASE) MCG/ACT IN AERS: 2.0000 | INHALATION_SPRAY | Freq: Four times a day (QID) | RESPIRATORY_TRACT | 0 refills | Status: DC | PRN

## 2016-03-25 MED ORDER — PRAMIPEXOLE DIHYDROCHLORIDE 0.125 MG PO TABS: 0.2500 mg | ORAL_TABLET | Freq: Every day | ORAL | 1 refills | Status: DC

## 2016-03-25 MED ORDER — BENZONATATE 200 MG PO CAPS: 200.0000 mg | ORAL_CAPSULE | Freq: Three times a day (TID) | ORAL | 0 refills | Status: DC | PRN

## 2016-03-25 MED ORDER — DOXYCYCLINE HYCLATE 100 MG PO TABS: 100.0000 mg | ORAL_TABLET | Freq: Two times a day (BID) | ORAL | 0 refills | Status: DC

## 2016-03-25 MED ORDER — PREDNISONE 10 MG PO TABS: 30.0000 mg | ORAL_TABLET | Freq: Every day | ORAL | 0 refills | Status: DC

## 2016-03-25 NOTE — Progress Notes (Signed)
Katrina Fernandez is a 50 y.o. female who presents to [facility] today for cough congestion shortness of breath and anxiety.  Coughing congestion: Patient notes a 3 to four-day history of cough congestion wheezing. Cough is mildly productive. Symptoms are consistent with previous episodes of bronchitis. She has tried some over-the-counter medications which have not helped. No fevers or chills.  Anxiety: Patient has a history of generalized anxiety disorder. She was seen last time this started on buspirone. She notes in the interim she's feeling that with her pain management provider that the symptoms of anxiety were very likely related to tapering off of methadone to soon. She notes that these were made things worse. She's feeling a bit better now with back to normal doses of methadone for pain management.  Stress: However patient notes worsening stress. Her daughter has neurofibromatosis type I and has been diagnosed with a glioma near her optic nerve. She just started chemotherapy for this issue. This is obviously distressing to Ms. Atkerson however she notes this is unlikely to be life-threatening for her daughter. She still working through her feelings on this issue. She is interested in counseling.   Past Medical History:  Diagnosis Date  . Arthritis    back and knees  . Chronic back pain    buldging,DDD  . Chronic constipation    takes Miralax daily  . Chronic pain syndrome    takes Methadone and Keppra daily  . Depression    takes Effexor daily  . GERD (gastroesophageal reflux disease)    not taking anything at the present time  . History of bronchitis 10+ yrs ago  . History of colon polyps    benign  . History of DVT of lower extremity    LEFT LEG --  3/ 2014.Was on a blood transfusion for 6 months  . History of kidney stones   . History of migraine    last one a month ago  . History of MRSA infection 10 yrs ago  . Hyperlipidemia    takes Simvastatin daily  . Muscle spasm    takes Zanaflex nightly  . Peripheral edema    takes Lasix daily as needed  . Peripheral neuropathy ([place])   . Pneumonia    hx of-7-8 yrs ago  . PONV (postoperative nausea and vomiting)   . Seasonal allergies    takes Claritin daily  . Trigger finger    right  . Weakness    and numbness in right hand   Past Surgical History:  Procedure Laterality Date  . ANTERIOR CERVICAL DECOMP/DISCECTOMY FUSION  07-25-2009   C5 -- C6  . APPENDECTOMY  age 19  . BREAST REDUCTION SURGERY Bilateral 06/07/2013   Procedure: BILATERAL MAMMARY REDUCTION  (BREAST);  Surgeon: Theodoro Kos, DO;  Location: [facility];  Service: Plastics;  Laterality: Bilateral;  . BREAST REDUCTION SURGERY Bilateral 06/07/2013   Procedure:  LIPOSUCTION;  Surgeon: Theodoro Kos, DO;  Location: [facility];  Service: Plastics;  Laterality: Bilateral;  . CARPAL TUNNEL RELEASE Bilateral 2001  . CHOLECYSTECTOMY    . COLONOSCOPY    . ESOPHAGOGASTRODUODENOSCOPY    . gastric banding undone  11/2014  . LAPAROSCOPIC GASTRIC BANDING  2011  . PLACEMENT LUMBOPERITONEAL SHUNT FOR CEREBROSPINAL FLUID DIVERSION AWAY FROM C8 PERINEURAL CYST  09-05-2003  &  12-24-2003   08-31-2003  REMOVAL SHUNT  . POSTERIOR CERVICAL LAMINECTOMY  03-01-2001   C7-8 and  Exploration C8 perineural cyst  . POSTERIOR LUMBAR LAMINECTOMY L4-5/  DISKECTOMY L5-S1  05-20-2005  . RE-DO DECOMPRESSION LAMINECTOMY AND FUSION L4-5  &  L5-S1  12-20-2006   02-04-2007--  RE-EXPLORATION L4 to S1, I & D LUMBAR WOUND HEMATOMA  . SPINAL CORD STIMULATOR INSERTION N/A 04/26/2015   Procedure: LUMBAR SPINAL CORD STIMULATOR INSERTION;  Surgeon: Clydell Hakim, MD;  Location: [facility];  Service: Neurosurgery;  Laterality: N/A;  LUMBAR SPINAL CORD STIMULATOR INSERTION  . TENNIS ELBOW RELEASE/NIRSCHEL PROCEDURE Left 12/13/2014    Procedure: LEFT ELBOW LATERAL EPICONDYLAR DEBRIDEMENT AND OR REPAIR -RECONSTRUCTION ;  Surgeon: Iran Planas, MD;  Location: [facility];  Service: Orthopedics;  Laterality: Left;  . TOTAL ABDOMINAL HYSTERECTOMY  1994   w/  Bilateral Salpinoophorectomy   Social History  Substance Use Topics  . Smoking status: Never Smoker  . Smokeless tobacco: Never Used  . Alcohol use No   family history includes Colon cancer in her father; Ovarian cancer in her maternal grandmother; Pancreatic cancer in her maternal grandfather.  ROS as above:  Medications: Current Outpatient Prescriptions  Medication Sig Dispense Refill  . albuterol (PROVENTIL HFA;VENTOLIN HFA) 108 (90 Base) MCG/ACT inhaler Inhale 2 puffs into the lungs every 6 (six) hours as needed for wheezing or shortness of breath. 1 Inhaler 0  . benzonatate (TESSALON) 200 MG capsule Take 1 capsule (200 mg total) by mouth 3 (three) times daily as needed for cough. 45 capsule 0  . cloNIDine (CATAPRES) 0.1 MG tablet Take by mouth.    . doxycycline (VIBRA-TABS) 100 MG tablet Take 1 tablet (100 mg total) by mouth 2 (two) times daily. 14 tablet 0  . DULoxetine (CYMBALTA) 60 MG capsule Take 2 capsules by mouth  daily 180 capsule 1  . FLUARIX QUADRIVALENT 0.5 ML injection     . furosemide (LASIX) 20 MG tablet TAKE 2 TABS BY EVERY MORNING AND 1 TAB EVERY AFTERNOON,ONLY AS NEEDED FOR LEG SWELLING (Patient taking differently: TAKE 1 TAB BY EVERY MORNING , FOR LEG SWELLING) 90 tablet 1  . furosemide (LASIX) 20 MG tablet TAKE 2 TABLETS BY MOUTH EVERY MORNING AND 1 TABLET EVERY AFTEROON, ONLY AS NEEDED FOR LEG SWELLING 90 tablet 0  . levETIRAcetam (KEPPRA) 500 MG tablet Take 500 mg by mouth 2 (two) times daily.     . methadone (DOLOPHINE) 5 MG tablet Take 5 mg by mouth 4 (four) times daily.  0  . montelukast (SINGULAIR) 10 MG tablet TAKE 1 TABLET (10 MG TOTAL) BY MOUTH AT BEDTIME. NEED FOLLOW UP VISIT FOR MORE REFILLS 30 tablet 0  . pramipexole  (MIRAPEX) 0.125 MG tablet Take 2 tablets (0.25 mg total) by mouth at bedtime. 180 tablet 1  . predniSONE (DELTASONE) 10 MG tablet Take 3 tablets (30 mg total) by mouth daily with breakfast. 15 tablet 0  . simvastatin (ZOCOR) 20 MG tablet Take 1 tablet (20 mg total) by mouth daily. NEED FOLLOW UP VISIT FOR MORE REFILLS 30 tablet 0  . tiZANidine (ZANAFLEX) 4 MG capsule Take 4-8 mg by mouth 2 (two) times daily. ONE TAB AM - PRN AND 2 TABS AT BEDTIME     No current facility-administered medications for this visit.    Allergies  Allergen Reactions  . Penicillins Anaphylaxis  . Lactose Rash and Swelling  . Other Swelling and Rash    Silicone Tape  . Tape Rash    Plastic tape  . Chlorhexidine     Dermatitis - Allergic    Health  Maintenance Health Maintenance  Topic Date Due  . INFLUENZA VACCINE  12/17/2015  . PAP SMEAR  10/17/2054 (Originally 08/23/1988)  . TETANUS/TDAP  05/19/2019  . HIV Screening  Completed     Exam:  BP (!) 137/96   Pulse (!) 107   Temp 98.8 F (37.1 C) (Oral)   SpO2 96%  Gen: Well NAD HEENT: EOMI,  MMM Lungs: Normal work of breathing. CTABL Heart: RRR no MRG Abd: NABS, Soft. Nondistended, Nontender Exts: Brisk capillary refill, warm and well perfused.   Depression screen [facility] 2/9 03/25/2016 02/20/2016  Decreased Interest 0 0  Down, Depressed, Hopeless 1 2  PHQ - 2 Score 1 2  Altered sleeping 3 3  Tired, decreased energy 2 1  Change in appetite 0 1  Feeling bad or failure about yourself  0 0  Trouble concentrating 0 0  Moving slowly or fidgety/restless 0 0  Suicidal thoughts 0 0  PHQ-9 Score 6 7  Difficult doing work/chores Somewhat difficult Somewhat difficult    GAD 7 : Generalized Anxiety Score 03/25/2016 02/20/2016  Nervous, Anxious, on Edge 1 3  Control/stop worrying 0 3  Worry too much - different things 1 3  Trouble relaxing 1 2  Restless 0 2  Easily annoyed or irritable 1 1  Afraid - awful might happen 0 0  Total GAD 7 Score 4 14    Anxiety Difficulty Somewhat difficult Somewhat difficult      No results found for this or any previous visit (from the past 72 hour(s)). Dg Chest 2 View  Result Date: 03/25/2016 CLINICAL DATA:  Pt c/o cough, congestion, hoarseness for several days but woke up and was 100% worse this morning, breast reduction, hyperlipidemia, GERD EXAM: CHEST - 2 VIEW COMPARISON:  08/18/2009 FINDINGS: Lungs are clear. Heart size and mediastinal contours are within normal limits. No effusion. Cervical fixation hardware partially seen. Surgical clips in the right axilla and upper abdomen. Dorsal stimulator catheter leads extend up to the T8 level. IMPRESSION: No acute cardiopulmonary disease. Electronically Signed   By: Lucrezia Europe M.D.   On: 03/25/2016 13:34      Assessment and Plan: 50 y.o. female with  Cough: Bronchitis. Treat empirically with albuterol Tessalon Perles and doxycycline. Continue watchful waiting.  Depression and anxiety: Increased life stressors recently. Recommend counseling. Continue current medications.   Orders Placed This Encounter  Procedures  . DG Chest 2 View    Order Specific Question:   Reason for exam:    Answer:   Cough, assess intra-thoracic pathology    Order Specific Question:   Is the patient pregnant?    Answer:   No    Order Specific Question:   Preferred imaging location?    Answer:   [facility]    Discussed warning signs or symptoms. Please see discharge instructions. Patient expresses understanding.

## 2016-03-25 NOTE — Patient Instructions (Signed)
Thank you for coming in today. Increase Mirapex to 2 pills at night for RLS.   USe albuterol and prednisone and tessalon for bronchitis.  Get a chest xray today.  Use backup doxycycline antibiotics if not better.  Call or go to the emergency room if you get worse, have trouble breathing, have chest pains, or palpitations.   I do recommend therapy for anxiety and stress.   Recheck in 1 month.   Let me know if you get worse.

## 2016-04-11 ENCOUNTER — Other Ambulatory Visit: Payer: Self-pay | Admitting: Osteopathic Medicine

## 2016-04-13 ENCOUNTER — Other Ambulatory Visit: Payer: Self-pay | Admitting: Family Medicine

## 2016-04-14 ENCOUNTER — Telehealth: Payer: Self-pay

## 2016-04-14 NOTE — Telephone Encounter (Signed)
Patient should probably schedule an appointment to come in and be seen

## 2016-04-14 NOTE — Telephone Encounter (Signed)
Pt left a VM stating that she is still experiencing RLS despite the increase in pramipexole and is not getting any sleep. Please advise.

## 2016-04-15 ENCOUNTER — Telehealth: Payer: Self-pay

## 2016-04-15 NOTE — Telephone Encounter (Signed)
Information discussed with pt. Pt verbalized understanding. States that she has a FU scheduled and she will call the office if she feels she needs to be seen before then.

## 2016-04-16 NOTE — Telephone Encounter (Signed)
Okay 

## 2016-04-21 ENCOUNTER — Ambulatory Visit (INDEPENDENT_AMBULATORY_CARE_PROVIDER_SITE_OTHER): Payer: Medicare Other

## 2016-04-21 ENCOUNTER — Encounter: Payer: Self-pay | Admitting: Family Medicine

## 2016-04-21 ENCOUNTER — Ambulatory Visit (INDEPENDENT_AMBULATORY_CARE_PROVIDER_SITE_OTHER): Payer: Medicare Other | Admitting: Family Medicine

## 2016-04-21 VITALS — BP 130/80 | HR 107 | Temp 98.0°F | Wt 215.0 lb

## 2016-04-21 DIAGNOSIS — R109 Unspecified abdominal pain: Secondary | ICD-10-CM

## 2016-04-21 DIAGNOSIS — G2581 Restless legs syndrome: Secondary | ICD-10-CM | POA: Diagnosis not present

## 2016-04-21 DIAGNOSIS — R1084 Generalized abdominal pain: Secondary | ICD-10-CM

## 2016-04-21 LAB — POCT URINALYSIS DIPSTICK
Bilirubin, UA: NEGATIVE
Blood, UA: NEGATIVE
Glucose, UA: NEGATIVE
Ketones, UA: NEGATIVE
Leukocytes, UA: NEGATIVE
Nitrite, UA: NEGATIVE
Protein, UA: NEGATIVE
Spec Grav, UA: 1.025
Urobilinogen, UA: 0.2
pH, UA: 5.5

## 2016-04-21 NOTE — Patient Instructions (Signed)
Thank you for coming in today.  You should hear about the sleep study soon.  Please let me know if you do not hear anything.  Get the plain xray today.  I will let you know about lab results.  Let plan for recheck a week or 2 after the sleep study.

## 2016-04-21 NOTE — Progress Notes (Signed)
Katrina Fernandez is a 50 y.o. female who presents to [facility] today for back pain, restless leg syndrome, abdominal bloating.  Back pain: Patient has a history of chronic back pain. However she notes new right-sided low back pain present for a few months. This is not typical for her. She has the pain seems to be worse when she lays down. The pain does not radiate. She is worried she may have a kidney stone. She has had kidney stones in the past however the current pain is not consistent with previous episodes of kidney stones. She denies any significant dysuria urgency or blood in the urine. The symptoms have been present now for several months.   Abdominal bloating: Patient has a several week history of abdominal bloating and early satiety and nausea. The symptoms are somewhat consistent with previous episodes of H. pylori. She denies any vomiting or diarrhea. She is a follow-up appointment with gastroenterology on December 15.  Restless leg syndrome: Patient has a history of restless leg syndrome of the right leg. This was diagnosed empirically not with a sleep study. She notes creepy crawly feeling in her right leg especially when she is trying to sleep. This is been treated previously with Mirapex which she notes has not helped very well. The symptoms are quite bothersome and interfere with sleep. She has a history of prior nerve injury in that leg is results of back surgery.   Past Medical History:  Diagnosis Date  . Arthritis    back and knees  . Chronic back pain    buldging,DDD  . Chronic constipation    takes Miralax daily  . Chronic pain syndrome    takes Methadone and Keppra daily  . Depression    takes Effexor daily  . GERD (gastroesophageal reflux disease)    not taking anything at the present time  . History of bronchitis 10+ yrs ago  . History of  colon polyps    benign  . History of DVT of lower extremity    LEFT LEG --  3/ 2014.Was on a blood transfusion for 6 months  . History of kidney stones   . History of migraine    last one a month ago  . History of MRSA infection 10 yrs ago  . Hyperlipidemia    takes Simvastatin daily  . Muscle spasm    takes Zanaflex nightly  . Peripheral edema    takes Lasix daily as needed  . Peripheral neuropathy ([place])   . Pneumonia    hx of-7-8 yrs ago  . PONV (postoperative nausea and vomiting)   . Seasonal allergies    takes Claritin daily  . Trigger finger    right  . Weakness    and numbness in right hand   Past Surgical History:  Procedure Laterality Date  . ANTERIOR CERVICAL DECOMP/DISCECTOMY FUSION  07-25-2009   C5 -- C6  . APPENDECTOMY  age 32  . BREAST REDUCTION SURGERY Bilateral 06/07/2013   Procedure: BILATERAL MAMMARY REDUCTION  (BREAST);  Surgeon: Theodoro Kos, DO;  Location: [facility];  Service: Plastics;  Laterality: Bilateral;  . BREAST REDUCTION SURGERY Bilateral 06/07/2013   Procedure:  LIPOSUCTION;  Surgeon: Theodoro Kos, DO;  Location: [facility];  Service: Plastics;  Laterality: Bilateral;  . CARPAL TUNNEL RELEASE Bilateral 2001  . CHOLECYSTECTOMY    . COLONOSCOPY    . ESOPHAGOGASTRODUODENOSCOPY    . gastric  banding undone  11/2014  . LAPAROSCOPIC GASTRIC BANDING  2011  . PLACEMENT LUMBOPERITONEAL SHUNT FOR CEREBROSPINAL FLUID DIVERSION AWAY FROM C8 PERINEURAL CYST  09-05-2003  &  12-24-2003   08-31-2003  REMOVAL SHUNT  . POSTERIOR CERVICAL LAMINECTOMY  03-01-2001   C7-8 and Exploration C8 perineural cyst  . POSTERIOR LUMBAR LAMINECTOMY L4-5/  DISKECTOMY L5-S1  05-20-2005  . RE-DO DECOMPRESSION LAMINECTOMY AND FUSION L4-5  &  L5-S1  12-20-2006   02-04-2007--  RE-EXPLORATION L4 to S1, I & D LUMBAR WOUND HEMATOMA  . SPINAL CORD STIMULATOR INSERTION N/A 04/26/2015   Procedure: LUMBAR SPINAL CORD STIMULATOR INSERTION;  Surgeon: Clydell Hakim, MD;  Location: [facility];  Service: Neurosurgery;  Laterality: N/A;  LUMBAR SPINAL CORD STIMULATOR INSERTION  . TENNIS ELBOW RELEASE/NIRSCHEL PROCEDURE Left 12/13/2014   Procedure: LEFT ELBOW LATERAL EPICONDYLAR DEBRIDEMENT AND OR REPAIR -RECONSTRUCTION ;  Surgeon: Iran Planas, MD;  Location: [facility];  Service: Orthopedics;  Laterality: Left;  . TOTAL ABDOMINAL HYSTERECTOMY  1994   w/  Bilateral Salpinoophorectomy   Social History  Substance Use Topics  . Smoking status: Never Smoker  . Smokeless tobacco: Never Used  . Alcohol use No   family history includes Colon cancer in her father; Ovarian cancer in her maternal grandmother; Pancreatic cancer in her maternal grandfather.  ROS as above:  Medications: Current Outpatient Prescriptions  Medication Sig Dispense Refill  . albuterol (PROVENTIL HFA;VENTOLIN HFA) 108 (90 Base) MCG/ACT inhaler Inhale 2 puffs into the lungs every 6 (six) hours as needed for wheezing or shortness of breath. 1 Inhaler 0  . benzonatate (TESSALON) 200 MG capsule Take 1 capsule (200 mg total) by mouth 3 (three) times daily as needed for cough. 45 capsule 0  . cloNIDine (CATAPRES) 0.1 MG tablet Take by mouth.    . doxycycline (VIBRA-TABS) 100 MG tablet Take 1 tablet (100 mg total) by mouth 2 (two) times daily. 14 tablet 0  . DULoxetine (CYMBALTA) 60 MG capsule Take 2 capsules by mouth  daily 180 capsule 1  . FLUARIX QUADRIVALENT 0.5 ML injection     . furosemide (LASIX) 20 MG tablet TAKE 2 TABS BY EVERY MORNING AND 1 TAB EVERY AFTERNOON,ONLY AS NEEDED FOR LEG SWELLING (Patient taking differently: TAKE 1 TAB BY EVERY MORNING , FOR LEG SWELLING) 90 tablet 1  . furosemide (LASIX) 20 MG tablet TAKE 2 TABLETS BY MOUTH EVERY MORNING AND 1 TABLET EVERY AFTEROON, ONLY AS NEEDED FOR LEG SWELLING 90 tablet 0  . levETIRAcetam (KEPPRA) 500 MG tablet Take 500 mg by mouth 2 (two) times daily.     . methadone (DOLOPHINE) 5 MG tablet Take 5 mg by mouth  4 (four) times daily.  0  . montelukast (SINGULAIR) 10 MG tablet TAKE 1 TABLET (10 MG TOTAL) BY MOUTH AT BEDTIME. NEED FOLLOW UP VISIT FOR MORE REFILLS 30 tablet 0  . pramipexole (MIRAPEX) 0.125 MG tablet Take 2 tablets (0.25 mg total) by mouth at bedtime. 180 tablet 1  . predniSONE (DELTASONE) 10 MG tablet Take 3 tablets (30 mg total) by mouth daily with breakfast. 15 tablet 0  . simvastatin (ZOCOR) 20 MG tablet Take 1 tablet (20 mg total) by mouth daily. NEED FOLLOW UP VISIT FOR MORE REFILLS 30 tablet 0  . tiZANidine (ZANAFLEX) 4 MG capsule Take 4-8 mg by mouth 2 (two) times daily. ONE TAB AM - PRN AND 2 TABS AT BEDTIME     No current facility-administered medications for this visit.  Allergies  Allergen Reactions  . Penicillins Anaphylaxis  . Lactose Rash and Swelling  . Other Swelling and Rash    Silicone Tape  . Tape Rash    Plastic tape  . Chlorhexidine     Dermatitis - Allergic    Health Maintenance Health Maintenance  Topic Date Due  . PAP SMEAR  10/17/2054 (Originally 08/23/1988)  . TETANUS/TDAP  05/19/2019  . INFLUENZA VACCINE  Completed  . HIV Screening  Completed     Exam:  BP 130/80   Pulse (!) 107   Temp 98 F (36.7 C) (Oral)   Wt 215 lb (97.5 kg)   SpO2 98%   BMI 38.09 kg/m  Gen: Well NAD HEENT: EOMI,  MMM Lungs: Normal work of breathing. CTABL Heart:Mild tachycardia no MRG Abd: NABS, Soft. Nondistended, Nontender Exts: Brisk capillary refill, warm and well perfused.  Back: Nontender to spinal midline. Mildly tender to palpation right flank. No CVA angle tenderness to percussion. Normal lumbar spine motion. Normal gait.   Results for orders placed or performed in visit on 04/21/16 (from the past 72 hour(s))  POCT Urinalysis Dipstick     Status: Normal   Collection Time: 04/21/16  1:56 PM  Result Value Ref Range   Color, UA yellow    Clarity, UA clear    Glucose, UA neg    Bilirubin, UA neg    Ketones, UA neg    Spec Grav, UA 1.025    Blood, UA  neg    pH, UA 5.5    Protein, UA neg    Urobilinogen, UA 0.2    Nitrite, UA neg    Leukocytes, UA Negative Negative   Dg Abd 1 View  Result Date: 04/21/2016 CLINICAL DATA:  Right flank pain for a month. EXAM: ABDOMEN - 1 VIEW COMPARISON:  None. FINDINGS: The bowel gas pattern is normal. Status post cholecystectomy. Spinal stimulator lead is seen in lower thoracic spinal canal status post surgical fusion of lower lumbar spine. No calcifications are noted. IMPRESSION: No evidence of bowel obstruction or ileus. Electronically Signed   By: Marijo Conception, M.D.   On: 04/21/2016 14:38      Assessment and Plan: 50 y.o. female with  Back pain: Unclear etiology doubtful for kidney stone. Very likely this is myofascial pain. We'll plan for referral to physical therapy.  Restless leg syndrome: Unclear etiology. At this point if she is failing typical management will refer for sleep study.  Early satiety: Associated with nausea consistent with prior episodes of H. pylori. We'll plan for H. pylori breath test. Patient has a pre-existing follow-up with gastroenterology. Will keep this appointment.    Orders Placed This Encounter  Procedures  . DG Abd 1 View    Order Specific Question:   Reason for exam:    Answer:   Assess stones.    Order Specific Question:   Is the patient pregnant?    Answer:   No    Order Specific Question:   Preferred imaging location?    Answer:   [facility]  . H. pylori breath test  . POCT Urinalysis Dipstick  . Nocturnal polysomnography (NPSG)    Standing Status:   Future    Standing Expiration Date:   04/21/2017    Order Specific Question:   Where should this test be performed:    Answer:   [place]    Discussed warning signs or symptoms. Please see discharge instructions. Patient expresses understanding.

## 2016-04-22 LAB — H. PYLORI BREATH TEST: H. pylori Breath Test: NOT DETECTED

## 2016-04-24 ENCOUNTER — Ambulatory Visit: Payer: Medicare Other | Admitting: Family Medicine

## 2016-04-27 ENCOUNTER — Other Ambulatory Visit: Payer: Self-pay | Admitting: Family Medicine

## 2016-05-13 ENCOUNTER — Other Ambulatory Visit: Payer: Self-pay

## 2016-05-13 ENCOUNTER — Encounter: Payer: Self-pay | Admitting: Family Medicine

## 2016-05-13 MED ORDER — DULOXETINE HCL 60 MG PO CPEP: 120.0000 mg | ORAL_CAPSULE | Freq: Every day | ORAL | 0 refills | Status: DC

## 2016-05-22 MED ORDER — PRAMIPEXOLE DIHYDROCHLORIDE 0.125 MG PO TABS: 0.3750 mg | ORAL_TABLET | Freq: Every day | ORAL | 1 refills | Status: DC

## 2016-06-30 ENCOUNTER — Other Ambulatory Visit: Payer: Self-pay | Admitting: Family Medicine

## 2016-07-01 MED ORDER — DULOXETINE HCL 60 MG PO CPEP: 120.0000 mg | ORAL_CAPSULE | Freq: Every day | ORAL | 3 refills | Status: DC

## 2016-07-03 ENCOUNTER — Ambulatory Visit (INDEPENDENT_AMBULATORY_CARE_PROVIDER_SITE_OTHER): Payer: Medicare Other

## 2016-07-03 ENCOUNTER — Ambulatory Visit (INDEPENDENT_AMBULATORY_CARE_PROVIDER_SITE_OTHER): Payer: Medicare Other | Admitting: Sports Medicine

## 2016-07-03 ENCOUNTER — Encounter: Payer: Self-pay | Admitting: Sports Medicine

## 2016-07-03 DIAGNOSIS — S9304XA Dislocation of right ankle joint, initial encounter: Secondary | ICD-10-CM

## 2016-07-03 DIAGNOSIS — IMO0001 Reserved for inherently not codable concepts without codable children: Secondary | ICD-10-CM

## 2016-07-03 DIAGNOSIS — M25471 Effusion, right ankle: Secondary | ICD-10-CM | POA: Diagnosis not present

## 2016-07-03 MED ORDER — PREDNISONE 50 MG PO TABS: 50.0000 mg | ORAL_TABLET | Freq: Every day | ORAL | 0 refills | Status: DC

## 2016-07-03 NOTE — Assessment & Plan Note (Signed)
Peroneal rehabilitation exercises. 5 days of prednisone. Ankle stabilizing orthosis. Return in one month, we will get more aggressive if no better. Her principal complaint and concern is paresthesias in the outside two toes, this is likely just neuropraxia from the inversion injury. She is post multilevel lumbar fusion with instrumentation.

## 2016-07-03 NOTE — Progress Notes (Signed)
   Subjective:    I'm seeing this patient as a consultation for:  Dr. Lynne Leader  CC: Right foot pain  HPI: While walking this pleasant 51 year old female inverted her right ankle, she felt a pop, had some pain but no swelling. No bruising. Now she has some numbness on the lateral 2 toes, present for the last couple of weeks. Her main concern is not the slight pain behind the lateral malleolus but more the numbness in the toes. She does have a extensive history of lumbar fusions with instrumentation.  Past medical history:  Negative.  See flowsheet/record as well for more information.  Surgical history: Negative.  See flowsheet/record as well for more information.  Family history: Negative.  See flowsheet/record as well for more information.  Social history: Negative.  See flowsheet/record as well for more information.  Allergies, and medications have been entered into the medical record, reviewed, and no changes needed.   Review of Systems: No headache, visual changes, nausea, vomiting, diarrhea, constipation, dizziness, abdominal pain, skin rash, fevers, chills, night sweats, weight loss, swollen lymph nodes, body aches, joint swelling, muscle aches, chest pain, shortness of breath, mood changes, visual or auditory hallucinations.   Objective:   General: Well Developed, well nourished, and in no acute distress.  Neuro/Psych: Alert and oriented x3, extra-ocular muscles intact, able to move all 4 extremities, sensation grossly intact. Skin: Warm and dry, no rashes noted.  Respiratory: Not using accessory muscles, speaking in full sentences, trachea midline.  Cardiovascular: Pulses palpable, no extremity edema. Abdomen: Does not appear distended. Right Ankle: No visible erythema or swelling. Range of motion is full in all directions. Strength is 5/5 in all directions. Stable lateral and medial ligaments; squeeze test and kleiger test unremarkable; Talar dome nontender; No pain at base  of 5th MT; No tenderness over cuboid; No tenderness over N spot or navicular prominence Only minimal pain behind the lateral malleolus No sign of peroneal tendon subluxations; Negative tarsal tunnel tinel's Able to walk 4 steps.  Impression and Recommendations:   This case required medical decision making of moderate complexity.  Subluxation of peroneal tendon of right foot Peroneal rehabilitation exercises. 5 days of prednisone. Ankle stabilizing orthosis. Return in one month, we will get more aggressive if no better. Her principal complaint and concern is paresthesias in the outside two toes, this is likely just neuropraxia from the inversion injury. She is post multilevel lumbar fusion with instrumentation.

## 2016-07-09 ENCOUNTER — Ambulatory Visit
Admission: RE | Admit: 2016-07-09 | Discharge: 2016-07-09 | Disposition: A | Discharge status: Home / Self Care | Payer: Medicare Other | Source: Ambulatory Visit | Attending: Neurosurgery | Admitting: Neurosurgery

## 2016-07-09 ENCOUNTER — Other Ambulatory Visit: Payer: Self-pay | Admitting: Neurosurgery

## 2016-07-09 ENCOUNTER — Encounter (HOSPITAL_COMMUNITY): Payer: Self-pay

## 2016-07-09 ENCOUNTER — Inpatient Hospital Stay (HOSPITAL_COMMUNITY)
Admission: EM | Admit: 2016-07-09 | Discharge: 2016-07-15 | DRG: 030 | Disposition: A | Discharge status: Home / Self Care | Payer: Medicare Other | Attending: Neurosurgery | Admitting: Neurosurgery

## 2016-07-09 DIAGNOSIS — G629 Polyneuropathy, unspecified: Secondary | ICD-10-CM | POA: Diagnosis present

## 2016-07-09 DIAGNOSIS — R51 Headache: Secondary | ICD-10-CM

## 2016-07-09 DIAGNOSIS — G894 Chronic pain syndrome: Secondary | ICD-10-CM | POA: Diagnosis present

## 2016-07-09 DIAGNOSIS — Z791 Long term (current) use of non-steroidal anti-inflammatories (NSAID): Secondary | ICD-10-CM

## 2016-07-09 DIAGNOSIS — Z982 Presence of cerebrospinal fluid drainage device: Secondary | ICD-10-CM

## 2016-07-09 DIAGNOSIS — K219 Gastro-esophageal reflux disease without esophagitis: Secondary | ICD-10-CM | POA: Diagnosis present

## 2016-07-09 DIAGNOSIS — M797 Fibromyalgia: Secondary | ICD-10-CM | POA: Diagnosis present

## 2016-07-09 DIAGNOSIS — Z981 Arthrodesis status: Secondary | ICD-10-CM

## 2016-07-09 DIAGNOSIS — G935 Compression of brain: Principal | ICD-10-CM | POA: Diagnosis present

## 2016-07-09 DIAGNOSIS — Z91048 Other nonmedicinal substance allergy status: Secondary | ICD-10-CM

## 2016-07-09 DIAGNOSIS — G2581 Restless legs syndrome: Secondary | ICD-10-CM | POA: Diagnosis present

## 2016-07-09 DIAGNOSIS — E669 Obesity, unspecified: Secondary | ICD-10-CM | POA: Diagnosis present

## 2016-07-09 DIAGNOSIS — Z86010 Personal history of colonic polyps: Secondary | ICD-10-CM | POA: Diagnosis not present

## 2016-07-09 DIAGNOSIS — K5909 Other constipation: Secondary | ICD-10-CM | POA: Diagnosis present

## 2016-07-09 DIAGNOSIS — Z8041 Family history of malignant neoplasm of ovary: Secondary | ICD-10-CM | POA: Diagnosis not present

## 2016-07-09 DIAGNOSIS — Z9049 Acquired absence of other specified parts of digestive tract: Secondary | ICD-10-CM | POA: Diagnosis not present

## 2016-07-09 DIAGNOSIS — R402253 Coma scale, best verbal response, oriented, at hospital admission: Secondary | ICD-10-CM | POA: Diagnosis present

## 2016-07-09 DIAGNOSIS — Z880 Allergy status to penicillin: Secondary | ICD-10-CM | POA: Diagnosis not present

## 2016-07-09 DIAGNOSIS — R402363 Coma scale, best motor response, obeys commands, at hospital admission: Secondary | ICD-10-CM | POA: Diagnosis present

## 2016-07-09 DIAGNOSIS — G43909 Migraine, unspecified, not intractable, without status migrainosus: Secondary | ICD-10-CM | POA: Diagnosis present

## 2016-07-09 DIAGNOSIS — M170 Bilateral primary osteoarthritis of knee: Secondary | ICD-10-CM | POA: Diagnosis present

## 2016-07-09 DIAGNOSIS — R519 Headache, unspecified: Secondary | ICD-10-CM

## 2016-07-09 DIAGNOSIS — Z6839 Body mass index (BMI) 39.0-39.9, adult: Secondary | ICD-10-CM | POA: Diagnosis not present

## 2016-07-09 DIAGNOSIS — Z90710 Acquired absence of both cervix and uterus: Secondary | ICD-10-CM

## 2016-07-09 DIAGNOSIS — M479 Spondylosis, unspecified: Secondary | ICD-10-CM | POA: Diagnosis present

## 2016-07-09 DIAGNOSIS — Z888 Allergy status to other drugs, medicaments and biological substances status: Secondary | ICD-10-CM | POA: Diagnosis not present

## 2016-07-09 DIAGNOSIS — E785 Hyperlipidemia, unspecified: Secondary | ICD-10-CM | POA: Diagnosis present

## 2016-07-09 DIAGNOSIS — Z91011 Allergy to milk products: Secondary | ICD-10-CM | POA: Diagnosis not present

## 2016-07-09 DIAGNOSIS — Z800 Family history of malignant neoplasm of digestive organs: Secondary | ICD-10-CM | POA: Diagnosis not present

## 2016-07-09 DIAGNOSIS — R402143 Coma scale, eyes open, spontaneous, at hospital admission: Secondary | ICD-10-CM | POA: Diagnosis present

## 2016-07-09 LAB — CBC WITH DIFFERENTIAL/PLATELET
Basophils Absolute: 0.1 10*3/uL (ref 0.0–0.1)
Basophils Relative: 1 %
Eosinophils Absolute: 0.1 10*3/uL (ref 0.0–0.7)
Eosinophils Relative: 1 %
HCT: 44.6 % (ref 36.0–46.0)
Hemoglobin: 15.2 g/dL — ABNORMAL HIGH (ref 12.0–15.0)
Lymphocytes Relative: 20 %
Lymphs Abs: 2 10*3/uL (ref 0.7–4.0)
MCH: 30.6 pg (ref 26.0–34.0)
MCHC: 34.1 g/dL (ref 30.0–36.0)
MCV: 89.7 fL (ref 78.0–100.0)
Monocytes Absolute: 0.8 10*3/uL (ref 0.1–1.0)
Monocytes Relative: 8 %
Neutro Abs: 7.4 10*3/uL (ref 1.7–7.7)
Neutrophils Relative %: 70 %
Platelets: 289 10*3/uL (ref 150–400)
RBC: 4.97 MIL/uL (ref 3.87–5.11)
RDW: 12.4 % (ref 11.5–15.5)
WBC: 10.4 10*3/uL (ref 4.0–10.5)

## 2016-07-09 LAB — BASIC METABOLIC PANEL
Anion gap: 11 (ref 5–15)
BUN: 6 mg/dL (ref 6–20)
CO2: 27 mmol/L (ref 22–32)
Calcium: 9.5 mg/dL (ref 8.9–10.3)
Chloride: 103 mmol/L (ref 101–111)
Creatinine, Ser: 0.75 mg/dL (ref 0.44–1.00)
GFR calc Af Amer: >60 mL/min (ref >60)
GFR calc non Af Amer: >60 mL/min (ref >60)
Glucose, Bld: 84 mg/dL (ref 65–99)
Potassium: 3.9 mmol/L (ref 3.5–5.1)
Sodium: 141 mmol/L (ref 135–145)

## 2016-07-09 LAB — PROTIME-INR
INR: 0.93
Prothrombin Time: 12.5 seconds (ref 11.4–15.2)

## 2016-07-09 MED ORDER — DULOXETINE HCL 60 MG PO CPEP
120.0000 mg | ORAL_CAPSULE | Freq: Every day | ORAL | Status: DC
  Administered 2016-07-11 – 2016-07-15 (×5): 120 mg via ORAL
  Filled 2016-07-09 (×5): qty 2

## 2016-07-09 MED ORDER — TRAMADOL HCL 50 MG PO TABS
50.0000 mg | ORAL_TABLET | Freq: Every evening | ORAL | Status: DC
Start: 2016-07-10 — End: 2016-07-15
  Administered 2016-07-11 – 2016-07-14 (×4): 50 mg via ORAL
  Filled 2016-07-09 (×4): qty 1

## 2016-07-09 MED ORDER — ACETAMINOPHEN 650 MG RE SUPP: 650.0000 mg | Freq: Four times a day (QID) | RECTAL | Status: DC | PRN

## 2016-07-09 MED ORDER — FUROSEMIDE 20 MG PO TABS
20.0000 mg | ORAL_TABLET | Freq: Every day | ORAL | Status: DC
  Administered 2016-07-11 – 2016-07-15 (×4): 20 mg via ORAL
  Filled 2016-07-09 (×5): qty 1

## 2016-07-09 MED ORDER — MORPHINE SULFATE (PF) 4 MG/ML IV SOLN
4.0000 mg | Freq: Once | INTRAVENOUS | Status: AC
  Administered 2016-07-09: 4 mg via INTRAVENOUS
  Filled 2016-07-09: qty 1

## 2016-07-09 MED ORDER — IOPAMIDOL (ISOVUE-300) INJECTION 61%
75.0000 mL | Freq: Once | INTRAVENOUS | Status: AC | PRN
  Administered 2016-07-09: 75 mL via INTRAVENOUS

## 2016-07-09 MED ORDER — METHADONE HCL 5 MG PO TABS
2.5000 mg | ORAL_TABLET | Freq: Every day | ORAL | Status: DC
Start: 2016-07-10 — End: 2016-07-15
  Administered 2016-07-11 – 2016-07-15 (×5): 2.5 mg via ORAL
  Filled 2016-07-09 (×5): qty 1

## 2016-07-09 MED ORDER — KETOROLAC TROMETHAMINE 30 MG/ML IJ SOLN
30.0000 mg | Freq: Once | INTRAMUSCULAR | Status: DC
  Filled 2016-07-09: qty 1

## 2016-07-09 MED ORDER — ASPIRIN-ACETAMINOPHEN-CAFFEINE 250-250-65 MG PO TABS
2.0000 | ORAL_TABLET | Freq: Four times a day (QID) | ORAL | Status: DC | PRN
  Filled 2016-07-09 (×3): qty 2

## 2016-07-09 MED ORDER — CYCLOBENZAPRINE HCL 10 MG PO TABS
10.0000 mg | ORAL_TABLET | Freq: Three times a day (TID) | ORAL | Status: DC | PRN
  Administered 2016-07-12: 10 mg via ORAL
  Filled 2016-07-09: qty 1

## 2016-07-09 MED ORDER — MELOXICAM 7.5 MG PO TABS
7.5000 mg | ORAL_TABLET | Freq: Every day | ORAL | Status: DC
  Administered 2016-07-11 – 2016-07-15 (×5): 7.5 mg via ORAL
  Filled 2016-07-09 (×5): qty 1

## 2016-07-09 MED ORDER — MENTHOL 3 MG MT LOZG: 1.0000 | LOZENGE | OROMUCOSAL | Status: DC | PRN

## 2016-07-09 MED ORDER — METOCLOPRAMIDE HCL 5 MG/ML IJ SOLN
10.0000 mg | Freq: Once | INTRAMUSCULAR | Status: AC
  Administered 2016-07-09: 10 mg via INTRAVENOUS
  Filled 2016-07-09: qty 2

## 2016-07-09 MED ORDER — LEVETIRACETAM 500 MG PO TABS
500.0000 mg | ORAL_TABLET | Freq: Two times a day (BID) | ORAL | Status: DC
  Administered 2016-07-09 – 2016-07-15 (×11): 500 mg via ORAL
  Filled 2016-07-09 (×11): qty 1

## 2016-07-09 MED ORDER — ACETAMINOPHEN 325 MG PO TABS: 650.0000 mg | ORAL_TABLET | Freq: Four times a day (QID) | ORAL | Status: DC | PRN

## 2016-07-09 MED ORDER — ONDANSETRON HCL 4 MG/2ML IJ SOLN
4.0000 mg | Freq: Once | INTRAMUSCULAR | Status: AC
  Administered 2016-07-09: 4 mg via INTRAVENOUS
  Filled 2016-07-09: qty 2

## 2016-07-09 MED ORDER — PHENOL 1.4 % MT LIQD: 1.0000 | OROMUCOSAL | Status: DC | PRN

## 2016-07-09 MED ORDER — ACETAMINOPHEN 650 MG RE SUPP: 650.0000 mg | RECTAL | Status: DC | PRN

## 2016-07-09 MED ORDER — SODIUM CHLORIDE 0.9 % IV BOLUS (SEPSIS)
1000.0000 mL | Freq: Once | INTRAVENOUS | Status: AC
  Administered 2016-07-09: 1000 mL via INTRAVENOUS

## 2016-07-09 MED ORDER — ALUM & MAG HYDROXIDE-SIMETH 200-200-20 MG/5ML PO SUSP: 30.0000 mL | Freq: Four times a day (QID) | ORAL | Status: DC | PRN

## 2016-07-09 MED ORDER — SODIUM CHLORIDE 0.9 % IV SOLN
250.0000 mL | INTRAVENOUS | Status: DC
  Administered 2016-07-09 – 2016-07-10 (×2): 250 mL via INTRAVENOUS

## 2016-07-09 MED ORDER — TIZANIDINE HCL 4 MG PO TABS
4.0000 mg | ORAL_TABLET | Freq: Two times a day (BID) | ORAL | Status: DC
  Administered 2016-07-09 – 2016-07-11 (×3): 8 mg via ORAL
  Administered 2016-07-12 (×2): 4 mg via ORAL
  Administered 2016-07-13 – 2016-07-14 (×3): 8 mg via ORAL
  Administered 2016-07-14: 4 mg via ORAL
  Filled 2016-07-09: qty 1
  Filled 2016-07-09 (×2): qty 2
  Filled 2016-07-09: qty 1
  Filled 2016-07-09 (×6): qty 2

## 2016-07-09 MED ORDER — SODIUM CHLORIDE 0.9% FLUSH
3.0000 mL | Freq: Two times a day (BID) | INTRAVENOUS | Status: DC
  Administered 2016-07-09 – 2016-07-10 (×3): 3 mL via INTRAVENOUS

## 2016-07-09 MED ORDER — SODIUM CHLORIDE 0.9% FLUSH: 3.0000 mL | INTRAVENOUS | Status: DC | PRN

## 2016-07-09 MED ORDER — ACETAMINOPHEN 325 MG PO TABS: 650.0000 mg | ORAL_TABLET | ORAL | Status: DC | PRN

## 2016-07-09 MED ORDER — ACETAZOLAMIDE 250 MG PO TABS
250.0000 mg | ORAL_TABLET | Freq: Two times a day (BID) | ORAL | Status: DC
  Administered 2016-07-09: 250 mg via ORAL
  Filled 2016-07-09 (×3): qty 1

## 2016-07-09 MED ORDER — SIMVASTATIN 20 MG PO TABS
20.0000 mg | ORAL_TABLET | Freq: Every day | ORAL | Status: DC
Start: 2016-07-10 — End: 2016-07-15
  Administered 2016-07-11 – 2016-07-14 (×4): 20 mg via ORAL
  Filled 2016-07-09 (×4): qty 1

## 2016-07-09 MED ORDER — ONDANSETRON HCL 4 MG/2ML IJ SOLN
4.0000 mg | INTRAMUSCULAR | Status: DC | PRN
  Administered 2016-07-10: 4 mg via INTRAVENOUS
  Filled 2016-07-09 (×2): qty 2

## 2016-07-09 MED ORDER — DEXAMETHASONE SODIUM PHOSPHATE 10 MG/ML IJ SOLN
10.0000 mg | Freq: Four times a day (QID) | INTRAMUSCULAR | Status: DC
  Administered 2016-07-09 – 2016-07-10 (×3): 10 mg via INTRAVENOUS
  Filled 2016-07-09 (×3): qty 1

## 2016-07-09 MED ORDER — PRAMIPEXOLE DIHYDROCHLORIDE 0.125 MG PO TABS
0.3750 mg | ORAL_TABLET | Freq: Every day | ORAL | Status: DC
  Administered 2016-07-09 – 2016-07-12 (×4): 0.375 mg via ORAL
  Filled 2016-07-09 (×2): qty 3
  Filled 2016-07-09: qty 1
  Filled 2016-07-09 (×2): qty 3

## 2016-07-09 MED ORDER — MONTELUKAST SODIUM 10 MG PO TABS
10.0000 mg | ORAL_TABLET | Freq: Every day | ORAL | Status: DC
  Administered 2016-07-11 – 2016-07-15 (×5): 10 mg via ORAL
  Filled 2016-07-09 (×5): qty 1

## 2016-07-09 MED ORDER — PANTOPRAZOLE SODIUM 40 MG IV SOLR
40.0000 mg | Freq: Every day | INTRAVENOUS | Status: DC
  Administered 2016-07-10 (×2): 40 mg via INTRAVENOUS
  Filled 2016-07-09: qty 40

## 2016-07-09 NOTE — ED Notes (Signed)
Received called from pt's neurologist, being sent over for complicated HA.

## 2016-07-09 NOTE — H&P (Signed)
Katrina Fernandez is an 51 y.o. female.   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 Ultimately she was having spinal headaches myelogram showed CSF leakage around this perineural cyst so we placed a lumbar peritoneal shunt which significantly helped her headaches and neck pain. This did very well for many many years however she's had chronic headaches she also had chronic neck and low back pain she's undergone anterior cervical fusion as well as lumbar fusion recently had a spinal stimulator placed about a year ago. Patient started experiencing worsening headaches back in September these are treated symptomatically with her pain management doctor but they got acutely worse 4-5 days ago and began characterized as being positional. When she lies down the headaches significantly less than she rates about 6-7 out of 10 and that's been a chronic baseline. When she gets up and ambulates they go to greater than a 10. He denies any vision changes denies any numbness or tingling in her arms or legs associated with headaches. Denies any vomiting she has had some occasional nausea. She has been able to maintain in appetite.  Past Medical History:  Diagnosis Date  . Arthritis    back and knees  . Chronic back pain    buldging,DDD  . Chronic constipation    takes Miralax daily  . Chronic pain syndrome    takes Methadone and Keppra daily  . Depression    takes Effexor daily  . GERD (gastroesophageal reflux disease)    not taking anything at the present time  . History of bronchitis 10+ yrs ago  . History of colon polyps    benign  . History of DVT of lower extremity    LEFT LEG --  3/ 2014.Was on a blood transfusion for 6 months  . History of kidney stones   . History of migraine    last one a month ago  . History of MRSA infection 10 yrs ago  . Hyperlipidemia    takes Simvastatin daily  . Muscle spasm    takes Zanaflex nightly  . Peripheral edema    takes Lasix daily as needed  . Peripheral neuropathy ([place])   . Pneumonia    hx of-7-8 yrs ago  . PONV (postoperative nausea and vomiting)   . Seasonal allergies    takes Claritin daily  . Trigger finger    right  . Weakness    and numbness in right hand    Past Surgical History:  Procedure Laterality Date  . ANTERIOR CERVICAL DECOMP/DISCECTOMY FUSION  07-25-2009   C5 -- C6  . APPENDECTOMY  age 21  . BREAST REDUCTION SURGERY Bilateral 06/07/2013   Procedure: BILATERAL MAMMARY REDUCTION  (BREAST);  Surgeon: Theodoro Kos, DO;  Location: [facility];  Service: Plastics;  Laterality: Bilateral;  . BREAST REDUCTION SURGERY Bilateral 06/07/2013   Procedure:  LIPOSUCTION;  Surgeon: Theodoro Kos, DO;  Location: [place];  Service: Plastics;  Laterality: Bilateral;  . CARPAL TUNNEL RELEASE Bilateral 2001  . CHOLECYSTECTOMY    . COLONOSCOPY    . ESOPHAGOGASTRODUODENOSCOPY    . gastric banding undone  11/2014  . LAPAROSCOPIC GASTRIC BANDING  2011  . PLACEMENT LUMBOPERITONEAL SHUNT FOR CEREBROSPINAL FLUID DIVERSION AWAY FROM C8 PERINEURAL CYST  09-05-2003  &  12-24-2003   08-31-2003  REMOVAL SHUNT  . POSTERIOR CERVICAL LAMINECTOMY  03-01-2001   C7-8  and Exploration C8 perineural cyst  . POSTERIOR LUMBAR LAMINECTOMY L4-5/  DISKECTOMY L5-S1  05-20-2005  . RE-DO DECOMPRESSION LAMINECTOMY AND FUSION L4-5  &  L5-S1  12-20-2006   02-04-2007--  RE-EXPLORATION L4 to S1, I & D LUMBAR WOUND HEMATOMA  . SPINAL CORD STIMULATOR INSERTION N/A 04/26/2015   Procedure: LUMBAR SPINAL CORD STIMULATOR INSERTION;  Surgeon: Clydell Hakim, MD;  Location: [facility];  Service: Neurosurgery;  Laterality: N/A;  LUMBAR SPINAL CORD STIMULATOR INSERTION  . TENNIS ELBOW RELEASE/NIRSCHEL PROCEDURE Left 12/13/2014   Procedure: LEFT ELBOW LATERAL EPICONDYLAR DEBRIDEMENT AND OR REPAIR -RECONSTRUCTION ;   Surgeon: Iran Planas, MD;  Location: [place];  Service: Orthopedics;  Laterality: Left;  . TOTAL ABDOMINAL HYSTERECTOMY  1994   w/  Bilateral Salpinoophorectomy    Family History  Problem Relation Age of Onset  . Depression    . Colon cancer Father   . Ovarian cancer Maternal Grandmother   . Pancreatic cancer Maternal Grandfather    Social History:  reports that she has never smoked. She has never used smokeless tobacco. She reports that she does not drink alcohol or use drugs.  Allergies:  Allergies  Allergen Reactions  . Penicillins Anaphylaxis  . Chlorhexidine Other (See Comments)    Dermatitis - Allergic  . Lactose Swelling and Rash  . Other Swelling and Rash    Silicone Tape  . Tape Rash    Plastic tape     (Not in a hospital admission)  Results for orders placed or performed during the hospital encounter of 07/09/16 (from the past 48 hour(s))  CBC with Differential/Platelet     Status: Abnormal   Collection Time: 07/09/16  9:05 PM  Result Value Ref Range   WBC 10.4 4.0 - 10.5 K/uL   RBC 4.97 3.87 - 5.11 MIL/uL   Hemoglobin 15.2 (H) 12.0 - 15.0 g/dL   HCT 44.6 36.0 - 46.0 %   MCV 89.7 78.0 - 100.0 fL   MCH 30.6 26.0 - 34.0 pg   MCHC 34.1 30.0 - 36.0 g/dL   RDW 12.4 11.5 - 15.5 %   Platelets 289 150 - 400 K/uL   Neutrophils Relative % 70 %   Neutro Abs 7.4 1.7 - 7.7 K/uL   Lymphocytes Relative 20 %   Lymphs Abs 2.0 0.7 - 4.0 K/uL   Monocytes Relative 8 %   Monocytes Absolute 0.8 0.1 - 1.0 K/uL   Eosinophils Relative 1 %   Eosinophils Absolute 0.1 0.0 - 0.7 K/uL   Basophils Relative 1 %   Basophils Absolute 0.1 0.0 - 0.1 K/uL  Protime-INR     Status: None   Collection Time: 07/09/16  9:05 PM  Result Value Ref Range   Prothrombin Time 12.5 11.4 - 15.2 seconds   INR 0.93    Ct Head W & Wo Contrast  Result Date: 07/09/2016 CLINICAL DATA:  Headaches for 4 days, worse with standing. EXAM: CT HEAD WITHOUT AND WITH CONTRAST TECHNIQUE:  Contiguous axial images were obtained from the base of the skull through the vertex without and with intravenous contrast CONTRAST:  85mL ISOVUE-300 IOPAMIDOL (ISOVUE-300) INJECTION 61% COMPARISON:  CT head 08/18/2009.  MRI cervical spine 03/11/2013. FINDINGS: Brain: No evidence for acute infarction, hemorrhage, mass lesion, hydrocephalus, or extra-axial fluid. Normal cerebral volume. No white matter disease. Subcentimeter osteoma or meningioma, RIGHT parietal convexity, may be slightly more calcified but not clearly larger, compared with 2011. No adjacent edema. Post infusion, no abnormal enhancement. Tonsillar herniation, better  evaluated on MR, redemonstrated, estimated 10 mm descent below can imaginary line across the foramen magnum. Vascular: No hyperdense vessel or unexpected calcification. Visible vessels are patent. Skull: Normal. Negative for fracture or focal lesion. Sinuses/Orbits: No acute finding. Other: None. IMPRESSION: No acute intracranial findings. No abnormal postcontrast enhancement. Tonsillar descent of 10 mm, consistent with Chiari 1 malformation. This may be increased from 2014. Electronically Signed   By: Staci Righter M.D.   On: 07/09/2016 13:35    Review of Systems  Musculoskeletal: Positive for back pain.  Neurological: Positive for dizziness and headaches.    Blood pressure 146/91, pulse 77, temperature 98.8 F (37.1 C), temperature source Oral, resp. rate 18, height 5\' 3"  (1.6 m), weight 99.8 kg (220 lb), SpO2 100 %. Physical Exam  Constitutional: She is oriented to person, place, and time. She appears well-developed.  HENT:  Head: Normocephalic.  Eyes: Pupils are equal, round, and reactive to light.  Neck: Normal range of motion.  Cardiovascular: Normal rate.   Respiratory: Effort normal.  GI: Soft. Bowel sounds are normal.  Neurological: She is alert and oriented to person, place, and time. She has normal strength. GCS eye subscore is 4. GCS verbal subscore is 5. GCS  motor subscore is 6.  Patient is awake and alert pupils equal extraocular movements are intact cranial nerves are otherwise intact strength 5 out of 5 upper and lower extremities     Assessment/Plan 51 year old female with postural headaches CT scan today showed a progression of her tonsillar descension to 10 mm below her foramen magnum. Clearly this is being exacerbated by her lumboperitoneal shunt. Which has been placed and present since 2005 patient currently is stable when she is recumbent and has not progressed over the last 4 days so we will admit the patient placed on IV steroids will start her on Diamox we will plan removal of lumboperitoneal shunt tomorrow.  Makell Cyr P, MD 07/09/2016, 9:32 PM

## 2016-07-09 NOTE — ED Notes (Signed)
Pt able to ambulate to restroom with only standby assist from RN. Pt remains hunched over as she walks because she states the pain increases sharply whenever she stands upright.

## 2016-07-09 NOTE — ED Notes (Signed)
Pt to be NPO after midnight. Given sandwich meal and apple juice, per request.

## 2016-07-09 NOTE — ED Notes (Signed)
Pt was seen by neurologist today. Pt had Ct scan done and was told she had Chiari malformation. Pt has hx shunt placement, 6 years ago due to a cyst on her spinal cord. Pt also has a spinal cord stimulator placed last year.

## 2016-07-09 NOTE — ED Notes (Signed)
Neuro surgery at bedside.

## 2016-07-09 NOTE — ED Provider Notes (Addendum)
[facility] DEPT Provider Note   CSN: [id number] Arrival date & time: 07/09/16  1722     History   Chief Complaint Chief Complaint  Patient presents with  . Migraine    HPI Katrina Fernandez is a 51 y.o. female.  HPI Patient with a history of lumboperitoneal shunt for a C8 perineal cyst.  This was performed 2005.  She's been having headache for several months but is significantly worsened over the past several days.  She spoke with her neurosurgeon today who ordered an outpatient CT which showed possible extension of her tonsil further pastor foramen magnum.  She was told by her neurosurgeon Dr. Saintclair Halsted to come to the ER for likely admission and plan for operative removal of her shunt.  Patient denies fevers or chills.  Reports worsening headache over the past several days that is moderate to severe in severity.   Past Medical History:  Diagnosis Date  . Arthritis    back and knees  . Chronic back pain    buldging,DDD  . Chronic constipation    takes Miralax daily  . Chronic pain syndrome    takes Methadone and Keppra daily  . Depression    takes Effexor daily  . GERD (gastroesophageal reflux disease)    not taking anything at the present time  . History of bronchitis 10+ yrs ago  . History of colon polyps    benign  . History of DVT of lower extremity    LEFT LEG --  3/ 2014.Was on a blood transfusion for 6 months  . History of kidney stones   . History of migraine    last one a month ago  . History of MRSA infection 10 yrs ago  . Hyperlipidemia    takes Simvastatin daily  . Muscle spasm    takes Zanaflex nightly  . Peripheral edema    takes Lasix daily as needed  . Peripheral neuropathy ([place])   . Pneumonia    hx of-7-8 yrs ago  . PONV (postoperative nausea and vomiting)   . Seasonal allergies    takes Claritin daily  . Trigger finger    right  . Weakness    and numbness in right hand    Patient Active Problem List   Diagnosis Date Noted  .  Prediabetes 02/21/2016  . Spinal cord stimulator status 11/01/2015  . RLS (restless legs syndrome) 10/23/2015  . Seasonal allergies 10/23/2015  . Hyperlipidemia 07/17/2015  . Osteoarthritis of carpometacarpal joint of left thumb 06/28/2015  . Bilateral ankle pain 05/30/2015  . Fibromyalgia 05/30/2015  . Chronic back pain 05/09/2015  . Insomnia 07/09/2014  . Lateral epicondylitis of both elbows 04/17/2014  . Generalized anxiety disorder 04/03/2014  . Chronic venous insufficiency 09/25/2013  . Edema 06/20/2013  . Symptomatic mammary hypertrophy 05/30/2013  . Nevus, non-neoplastic 08/01/2012  . Bilateral Ankle swelling 03/08/2012  . Superficial vein thrombosis 03/04/2012  . Osteoarthritis of both knees 02/08/2012  . Depression 01/28/2012  . Subluxation of peroneal tendon of right foot 01/28/2012  . GERD (gastroesophageal reflux disease) 01/28/2012    Past Surgical History:  Procedure Laterality Date  . ANTERIOR CERVICAL DECOMP/DISCECTOMY FUSION  07-25-2009   C5 -- C6  . APPENDECTOMY  age 72  . BREAST REDUCTION SURGERY Bilateral 06/07/2013   Procedure: BILATERAL MAMMARY REDUCTION  (BREAST);  Surgeon: Theodoro Kos, DO;  Location: [facility];  Service: Plastics;  Laterality: Bilateral;  . BREAST REDUCTION SURGERY Bilateral 06/07/2013   Procedure:  LIPOSUCTION;  Surgeon:  Claire Sanger, DO;  Location: [facility];  Service: Plastics;  Laterality: Bilateral;  . CARPAL TUNNEL RELEASE Bilateral 2001  . CHOLECYSTECTOMY    . COLONOSCOPY    . ESOPHAGOGASTRODUODENOSCOPY    . gastric banding undone  11/2014  . LAPAROSCOPIC GASTRIC BANDING  2011  . PLACEMENT LUMBOPERITONEAL SHUNT FOR CEREBROSPINAL FLUID DIVERSION AWAY FROM C8 PERINEURAL CYST  09-05-2003  &  12-24-2003   08-31-2003  REMOVAL SHUNT  . POSTERIOR CERVICAL LAMINECTOMY  03-01-2001   C7-8 and Exploration C8 perineural cyst  . POSTERIOR LUMBAR LAMINECTOMY L4-5/  DISKECTOMY L5-S1  05-20-2005  . RE-DO  DECOMPRESSION LAMINECTOMY AND FUSION L4-5  &  L5-S1  12-20-2006   02-04-2007--  RE-EXPLORATION L4 to S1, I & D LUMBAR WOUND HEMATOMA  . SPINAL CORD STIMULATOR INSERTION N/A 04/26/2015   Procedure: LUMBAR SPINAL CORD STIMULATOR INSERTION;  Surgeon: Clydell Hakim, MD;  Location: [facility];  Service: Neurosurgery;  Laterality: N/A;  LUMBAR SPINAL CORD STIMULATOR INSERTION  . TENNIS ELBOW RELEASE/NIRSCHEL PROCEDURE Left 12/13/2014   Procedure: LEFT ELBOW LATERAL EPICONDYLAR DEBRIDEMENT AND OR REPAIR -RECONSTRUCTION ;  Surgeon: Iran Planas, MD;  Location: [facility];  Service: Orthopedics;  Laterality: Left;  . TOTAL ABDOMINAL HYSTERECTOMY  1994   w/  Bilateral Salpinoophorectomy    OB History    No data available       Home Medications    Prior to Admission medications   Medication Sig Start Date End Date Taking? Authorizing Provider  aspirin-acetaminophen-caffeine (EXCEDRIN MIGRAINE) [phone] MG tablet Take 2 tablets by mouth every 6 (six) hours as needed for headache or migraine.   Yes Historical Provider, MD  DULoxetine (CYMBALTA) 60 MG capsule Take 2 capsules (120 mg total) by mouth daily. 07/01/16  Yes Gregor Hams, MD  furosemide (LASIX) 20 MG tablet TAKE 2 TABLETS BY MOUTH EVERY MORNING AND 1 TABLET EVERY AFTEROON, ONLY AS NEEDED FOR LEG SWELLING Patient taking differently: TAKES 1 TAB IN MORNING 04/13/16  Yes Gregor Hams, MD  levETIRAcetam (KEPPRA) 500 MG tablet Take 500 mg by mouth 2 (two) times daily.    Yes Historical Provider, MD  meloxicam (MOBIC) 7.5 MG tablet Take 7.5 mg by mouth daily. 04/22/16 04/22/17 Yes Historical Provider, MD  methadone (DOLOPHINE) 5 MG tablet Take 2.5 mg by mouth every morning.  06/23/15  Yes Historical Provider, MD  montelukast (SINGULAIR) 10 MG tablet TAKE 1 TABLET (10 MG TOTAL) BY MOUTH AT BEDTIME. Patient taking differently: Take 10 mg by mouth every morning.  04/27/16  Yes Gregor Hams, MD  pramipexole (MIRAPEX) 0.125 MG tablet Take  3 tablets (0.375 mg total) by mouth at bedtime. (2-3 hours prior to bedtime) 05/22/16  Yes Gregor Hams, MD  simvastatin (ZOCOR) 20 MG tablet Take 1 tablet (20 mg total) by mouth daily. NEED FOLLOW UP VISIT FOR MORE REFILLS 02/13/16  Yes Silverio Decamp, MD  tiZANidine (ZANAFLEX) 4 MG capsule Take 4-8 mg by mouth 2 (two) times daily. ONE TAB AM - PRN AND 2 TABS AT BEDTIME   Yes Historical Provider, MD  traMADol (ULTRAM) 50 MG tablet Take 50 mg by mouth every evening.   Yes Historical Provider, MD    Family History Family History  Problem Relation Age of Onset  . Depression    . Colon cancer Father   . Ovarian cancer Maternal Grandmother   . Pancreatic cancer Maternal Grandfather     Social History Social History  Substance Use Topics  .  Smoking status: Never Smoker  . Smokeless tobacco: Never Used  . Alcohol use No     Allergies   Penicillins; Chlorhexidine; Lactose; Other; and Tape   Review of Systems Review of Systems  All other systems reviewed and are negative.    Physical Exam Updated Vital Signs BP 137/67   Pulse 79   Temp 98.8 F (37.1 C) (Oral)   Resp 18   Ht 5\' 3"  (1.6 m)   Wt 220 lb (99.8 kg)   SpO2 99%   BMI 38.97 kg/m   Physical Exam  Constitutional: She is oriented to person, place, and time. She appears well-developed and well-nourished. No distress.  HENT:  Head: Normocephalic and atraumatic.  Eyes: EOM are normal.  Neck: Normal range of motion.  Cardiovascular: Normal rate and regular rhythm.   Pulmonary/Chest: Effort normal and breath sounds normal.  Abdominal: Soft. She exhibits no distension. There is no tenderness.  Musculoskeletal: Normal range of motion.  Neurological: She is alert and oriented to person, place, and time.  No weakness of her upper lower extremity major muscle groups.  Skin: Skin is warm and dry.  Psychiatric: She has a normal mood and affect. Judgment normal.  Nursing note and vitals reviewed.    ED Treatments  / Results  Labs (all labs ordered are listed, but only abnormal results are displayed) Labs Reviewed  CBC WITH DIFFERENTIAL/PLATELET  BASIC METABOLIC PANEL  PROTIME-INR    EKG  EKG Interpretation None       Radiology Ct Head W & Wo Contrast  Result Date: 07/09/2016 CLINICAL DATA:  Headaches for 4 days, worse with standing. EXAM: CT HEAD WITHOUT AND WITH CONTRAST TECHNIQUE: Contiguous axial images were obtained from the base of the skull through the vertex without and with intravenous contrast CONTRAST:  3mL ISOVUE-300 IOPAMIDOL (ISOVUE-300) INJECTION 61% COMPARISON:  CT head 08/18/2009.  MRI cervical spine 03/11/2013. FINDINGS: Brain: No evidence for acute infarction, hemorrhage, mass lesion, hydrocephalus, or extra-axial fluid. Normal cerebral volume. No white matter disease. Subcentimeter osteoma or meningioma, RIGHT parietal convexity, may be slightly more calcified but not clearly larger, compared with 2011. No adjacent edema. Post infusion, no abnormal enhancement. Tonsillar herniation, better evaluated on MR, redemonstrated, estimated 10 mm descent below can imaginary line across the foramen magnum. Vascular: No hyperdense vessel or unexpected calcification. Visible vessels are patent. Skull: Normal. Negative for fracture or focal lesion. Sinuses/Orbits: No acute finding. Other: None. IMPRESSION: No acute intracranial findings. No abnormal postcontrast enhancement. Tonsillar descent of 10 mm, consistent with Chiari 1 malformation. This may be increased from 2014. Electronically Signed   By: Staci Righter M.D.   On: 07/09/2016 13:35    Procedures Procedures (including critical care time)  Medications Ordered in ED Medications  metoCLOPramide (REGLAN) injection 10 mg (not administered)  morphine 4 MG/ML injection 4 mg (not administered)  ondansetron (ZOFRAN) injection 4 mg (not administered)  ketorolac (TORADOL) 30 MG/ML injection 30 mg (not administered)  sodium chloride 0.9 %  bolus 1,000 mL (not administered)     Initial Impression / Assessment and Plan / ED Course  I have reviewed the triage vital signs and the nursing notes.  Pertinent labs & imaging results that were available during my care of the patient were reviewed by me and considered in my medical decision making (see chart for details).     I spoke with Dr Saintclair Halsted, neurosurgery, who will evaluate the patient at the bedside  Final Clinical Impressions(s) / ED Diagnoses   Final  diagnoses:  Nonintractable headache, unspecified chronicity pattern, unspecified headache type    New Prescriptions New Prescriptions   No medications on file     Jola Schmidt, MD 07/09/16 2115    Jola Schmidt, MD 07/17/16 [phone]

## 2016-07-09 NOTE — ED Triage Notes (Signed)
Per Pt, Pt is coming from home with complaints of migraines that started four days ago. Pt called neurologist and was sent to CT today. Has Hx of Shunt and Ct confirmed shunt was not working properly. Pt reports worsening headaches.

## 2016-07-09 NOTE — ED Notes (Signed)
ED Provider at bedside. 

## 2016-07-10 ENCOUNTER — Encounter (HOSPITAL_COMMUNITY): Payer: Self-pay

## 2016-07-10 ENCOUNTER — Encounter (HOSPITAL_COMMUNITY)
Admission: EM | Disposition: A | Discharge status: Home / Self Care | Payer: Self-pay | Source: Home / Self Care | Attending: Neurosurgery

## 2016-07-10 ENCOUNTER — Inpatient Hospital Stay (HOSPITAL_COMMUNITY): Payer: Medicare Other | Admitting: Anesthesiology

## 2016-07-10 HISTORY — PX: SHUNT REMOVAL: SHX342

## 2016-07-10 LAB — BASIC METABOLIC PANEL
Anion gap: 10 (ref 5–15)
BUN: 8 mg/dL (ref 6–20)
CO2: 25 mmol/L (ref 22–32)
Calcium: 9.1 mg/dL (ref 8.9–10.3)
Chloride: 106 mmol/L (ref 101–111)
Creatinine, Ser: 0.88 mg/dL (ref 0.44–1.00)
GFR calc Af Amer: >60 mL/min (ref >60)
GFR calc non Af Amer: >60 mL/min (ref >60)
Glucose, Bld: 167 mg/dL — ABNORMAL HIGH (ref 65–99)
Potassium: 4.3 mmol/L (ref 3.5–5.1)
Sodium: 141 mmol/L (ref 135–145)

## 2016-07-10 LAB — CBC
HCT: 43.1 % (ref 36.0–46.0)
Hemoglobin: 13.8 g/dL (ref 12.0–15.0)
MCH: 28.9 pg (ref 26.0–34.0)
MCHC: 32 g/dL (ref 30.0–36.0)
MCV: 90.2 fL (ref 78.0–100.0)
Platelets: 265 10*3/uL (ref 150–400)
RBC: 4.78 MIL/uL (ref 3.87–5.11)
RDW: 12.4 % (ref 11.5–15.5)
WBC: 12.7 10*3/uL — ABNORMAL HIGH (ref 4.0–10.5)

## 2016-07-10 LAB — MRSA PCR SCREENING: MRSA by PCR: NEGATIVE

## 2016-07-10 LAB — PROTIME-INR
INR: 1.05
Prothrombin Time: 13.7 seconds (ref 11.4–15.2)

## 2016-07-10 SURGERY — SHUNT REMOVAL
Anesthesia: General | Site: Back

## 2016-07-10 MED ORDER — ALUM & MAG HYDROXIDE-SIMETH 200-200-20 MG/5ML PO SUSP: 30.0000 mL | Freq: Four times a day (QID) | ORAL | Status: DC | PRN

## 2016-07-10 MED ORDER — ACETAMINOPHEN 650 MG RE SUPP: 650.0000 mg | Freq: Four times a day (QID) | RECTAL | Status: DC | PRN

## 2016-07-10 MED ORDER — ONDANSETRON HCL 4 MG/2ML IJ SOLN
4.0000 mg | INTRAMUSCULAR | Status: DC | PRN
  Administered 2016-07-10 – 2016-07-12 (×6): 4 mg via INTRAVENOUS
  Filled 2016-07-10 (×5): qty 2

## 2016-07-10 MED ORDER — VANCOMYCIN HCL IN DEXTROSE 1-5 GM/200ML-% IV SOLN
1000.0000 mg | Freq: Once | INTRAVENOUS | Status: AC
  Administered 2016-07-11: 1000 mg via INTRAVENOUS
  Filled 2016-07-10: qty 200

## 2016-07-10 MED ORDER — SCOPOLAMINE 1 MG/3DAYS TD PT72
MEDICATED_PATCH | TRANSDERMAL | Status: AC
  Filled 2016-07-10: qty 1

## 2016-07-10 MED ORDER — VANCOMYCIN HCL 1000 MG IV SOLR
INTRAVENOUS | Status: AC
  Filled 2016-07-10: qty 1000

## 2016-07-10 MED ORDER — PROMETHAZINE HCL 25 MG/ML IJ SOLN
25.0000 mg | Freq: Three times a day (TID) | INTRAMUSCULAR | Status: DC | PRN
  Administered 2016-07-10: 25 mg via INTRAVENOUS
  Filled 2016-07-10: qty 1

## 2016-07-10 MED ORDER — OXYCODONE HCL 5 MG PO TABS
5.0000 mg | ORAL_TABLET | ORAL | Status: DC | PRN
  Administered 2016-07-11 (×2): 10 mg via ORAL
  Administered 2016-07-12: 5 mg via ORAL
  Administered 2016-07-12 – 2016-07-15 (×8): 10 mg via ORAL
  Filled 2016-07-10: qty 1
  Filled 2016-07-10 (×9): qty 2

## 2016-07-10 MED ORDER — LIDOCAINE-EPINEPHRINE (PF) 2 %-1:200000 IJ SOLN
INTRAMUSCULAR | Status: AC
  Filled 2016-07-10: qty 20

## 2016-07-10 MED ORDER — FENTANYL CITRATE (PF) 100 MCG/2ML IJ SOLN
INTRAMUSCULAR | Status: AC
  Filled 2016-07-10: qty 2

## 2016-07-10 MED ORDER — ROCURONIUM BROMIDE 100 MG/10ML IV SOLN
INTRAVENOUS | Status: DC | PRN
  Administered 2016-07-10: 50 mg via INTRAVENOUS

## 2016-07-10 MED ORDER — FENTANYL CITRATE (PF) 100 MCG/2ML IJ SOLN
INTRAMUSCULAR | Status: DC | PRN
  Administered 2016-07-10 (×4): 50 ug via INTRAVENOUS

## 2016-07-10 MED ORDER — SODIUM CHLORIDE 0.9% FLUSH: 3.0000 mL | INTRAVENOUS | Status: DC | PRN

## 2016-07-10 MED ORDER — MIDAZOLAM HCL 2 MG/2ML IJ SOLN
INTRAMUSCULAR | Status: AC
  Filled 2016-07-10: qty 2

## 2016-07-10 MED ORDER — CYCLOBENZAPRINE HCL 10 MG PO TABS: 10.0000 mg | ORAL_TABLET | Freq: Three times a day (TID) | ORAL | Status: DC | PRN

## 2016-07-10 MED ORDER — MIDAZOLAM HCL 5 MG/5ML IJ SOLN
INTRAMUSCULAR | Status: DC | PRN
  Administered 2016-07-10: 2 mg via INTRAVENOUS

## 2016-07-10 MED ORDER — PANTOPRAZOLE SODIUM 40 MG IV SOLR
40.0000 mg | Freq: Every day | INTRAVENOUS | Status: DC
  Filled 2016-07-10: qty 40

## 2016-07-10 MED ORDER — FENTANYL CITRATE (PF) 100 MCG/2ML IJ SOLN
25.0000 ug | INTRAMUSCULAR | Status: DC | PRN
  Administered 2016-07-10: 50 ug via INTRAVENOUS

## 2016-07-10 MED ORDER — CEFAZOLIN SODIUM-DEXTROSE 2-4 GM/100ML-% IV SOLN: 2.0000 g | Freq: Three times a day (TID) | INTRAVENOUS | Status: DC

## 2016-07-10 MED ORDER — PROPOFOL 10 MG/ML IV BOLUS
INTRAVENOUS | Status: AC
  Filled 2016-07-10: qty 40

## 2016-07-10 MED ORDER — VANCOMYCIN HCL 1000 MG IV SOLR
INTRAVENOUS | Status: DC | PRN
  Administered 2016-07-10: 1000 mg via INTRAVENOUS

## 2016-07-10 MED ORDER — LACTATED RINGERS IV SOLN
INTRAVENOUS | Status: DC | PRN
  Administered 2016-07-10: 13:00:00 via INTRAVENOUS

## 2016-07-10 MED ORDER — SODIUM CHLORIDE 0.9 % IV SOLN: 250.0000 mL | INTRAVENOUS | Status: DC

## 2016-07-10 MED ORDER — SODIUM CHLORIDE 0.9% FLUSH
3.0000 mL | Freq: Two times a day (BID) | INTRAVENOUS | Status: DC
  Administered 2016-07-10 – 2016-07-15 (×8): 3 mL via INTRAVENOUS

## 2016-07-10 MED ORDER — VANCOMYCIN HCL IN DEXTROSE 1-5 GM/200ML-% IV SOLN
1000.0000 mg | INTRAVENOUS | Status: DC
  Filled 2016-07-10: qty 200

## 2016-07-10 MED ORDER — ACETAMINOPHEN 325 MG PO TABS
650.0000 mg | ORAL_TABLET | ORAL | Status: DC | PRN
  Administered 2016-07-14: 650 mg via ORAL
  Filled 2016-07-10 (×2): qty 2

## 2016-07-10 MED ORDER — ACETAMINOPHEN 325 MG PO TABS: 650.0000 mg | ORAL_TABLET | Freq: Four times a day (QID) | ORAL | Status: DC | PRN

## 2016-07-10 MED ORDER — SUGAMMADEX SODIUM 200 MG/2ML IV SOLN
INTRAVENOUS | Status: DC | PRN
  Administered 2016-07-10: 203.6 mg via INTRAVENOUS

## 2016-07-10 MED ORDER — LIDOCAINE HCL (CARDIAC) 20 MG/ML IV SOLN
INTRAVENOUS | Status: DC | PRN
  Administered 2016-07-10: 20 mg via INTRAVENOUS

## 2016-07-10 MED ORDER — ACETAMINOPHEN 650 MG RE SUPP: 650.0000 mg | RECTAL | Status: DC | PRN

## 2016-07-10 MED ORDER — DEXAMETHASONE SODIUM PHOSPHATE 10 MG/ML IJ SOLN
4.0000 mg | Freq: Four times a day (QID) | INTRAMUSCULAR | Status: AC
  Administered 2016-07-10 – 2016-07-11 (×4): 4 mg via INTRAVENOUS
  Filled 2016-07-10 (×4): qty 1

## 2016-07-10 MED ORDER — ARTIFICIAL TEARS OP OINT
TOPICAL_OINTMENT | OPHTHALMIC | Status: DC | PRN
  Administered 2016-07-10: 1 via OPHTHALMIC

## 2016-07-10 MED ORDER — MENTHOL 3 MG MT LOZG: 1.0000 | LOZENGE | OROMUCOSAL | Status: DC | PRN

## 2016-07-10 MED ORDER — HYDROMORPHONE HCL 1 MG/ML IJ SOLN
1.0000 mg | INTRAMUSCULAR | Status: DC | PRN
  Filled 2016-07-10: qty 1

## 2016-07-10 MED ORDER — HYDROMORPHONE HCL 1 MG/ML IJ SOLN
1.0000 mg | INTRAMUSCULAR | Status: DC | PRN
  Administered 2016-07-10 – 2016-07-12 (×3): 1 mg via INTRAVENOUS
  Filled 2016-07-10 (×2): qty 1

## 2016-07-10 MED ORDER — 0.9 % SODIUM CHLORIDE (POUR BTL) OPTIME
TOPICAL | Status: DC | PRN
  Administered 2016-07-10: 1000 mL

## 2016-07-10 MED ORDER — MEPERIDINE HCL 25 MG/ML IJ SOLN: 6.2500 mg | INTRAMUSCULAR | Status: DC | PRN

## 2016-07-10 MED ORDER — LIDOCAINE-EPINEPHRINE (PF) 2 %-1:200000 IJ SOLN
INTRAMUSCULAR | Status: DC | PRN
  Administered 2016-07-10: 10 mL via INTRADERMAL

## 2016-07-10 MED ORDER — THROMBIN 5000 UNITS EX SOLR
CUTANEOUS | Status: AC
  Filled 2016-07-10: qty 10000

## 2016-07-10 MED ORDER — BACITRACIN 50000 UNITS IM SOLR
INTRAMUSCULAR | Status: DC | PRN
  Administered 2016-07-10: 14:00:00

## 2016-07-10 MED ORDER — PROPOFOL 10 MG/ML IV BOLUS
INTRAVENOUS | Status: DC | PRN
  Administered 2016-07-10: 150 mg via INTRAVENOUS

## 2016-07-10 MED ORDER — ONDANSETRON HCL 4 MG/2ML IJ SOLN
INTRAMUSCULAR | Status: DC | PRN
  Administered 2016-07-10: 4 mg via INTRAVENOUS

## 2016-07-10 MED ORDER — MIDAZOLAM HCL 2 MG/2ML IJ SOLN: 0.5000 mg | Freq: Once | INTRAMUSCULAR | Status: DC | PRN

## 2016-07-10 MED ORDER — THROMBIN 5000 UNITS EX SOLR
CUTANEOUS | Status: AC
  Filled 2016-07-10: qty 5000

## 2016-07-10 MED ORDER — BACITRACIN ZINC 500 UNIT/GM EX OINT
TOPICAL_OINTMENT | CUTANEOUS | Status: AC
  Filled 2016-07-10: qty 28.35

## 2016-07-10 MED ORDER — PHENOL 1.4 % MT LIQD: 1.0000 | OROMUCOSAL | Status: DC | PRN

## 2016-07-10 MED ORDER — SCOPOLAMINE 1 MG/3DAYS TD PT72
MEDICATED_PATCH | TRANSDERMAL | Status: DC | PRN
  Administered 2016-07-10: 1 via TRANSDERMAL

## 2016-07-10 MED ORDER — PROMETHAZINE HCL 25 MG/ML IJ SOLN: 6.2500 mg | INTRAMUSCULAR | Status: DC | PRN

## 2016-07-10 SURGICAL SUPPLY — 89 items
ADH SKN CLS APL DERMABOND .7 (GAUZE/BANDAGES/DRESSINGS) ×1
APL SKNCLS STERI-STRIP NONHPOA (GAUZE/BANDAGES/DRESSINGS) ×1
BAG DECANTER FOR FLEXI CONT (MISCELLANEOUS) ×2 IMPLANT
BENZOIN TINCTURE PRP APPL 2/3 (GAUZE/BANDAGES/DRESSINGS) ×2 IMPLANT
BLADE SURG 10 STRL SS (BLADE) ×4 IMPLANT
BLADE SURG 11 STRL SS (BLADE) ×1 IMPLANT
BLADE SURG 15 STRL LF DISP TIS (BLADE) ×1 IMPLANT
BLADE SURG 15 STRL SS (BLADE) ×2
BUR ACORN 6.0 PRECISION (BURR) ×1 IMPLANT
CANISTER SUCT 3000ML PPV (MISCELLANEOUS) ×2 IMPLANT
CARTRIDGE OIL MAESTRO DRILL (MISCELLANEOUS) ×1 IMPLANT
CLIP RANEY DISP (INSTRUMENTS) IMPLANT
CONT SPEC 4OZ CLIKSEAL STRL BL (MISCELLANEOUS) ×1 IMPLANT
CORDS BIPOLAR (ELECTRODE) ×2 IMPLANT
COVER BACK TABLE 60X90IN (DRAPES) ×2 IMPLANT
COVER MAYO STAND STRL (DRAPES) ×2 IMPLANT
DECANTER SPIKE VIAL GLASS SM (MISCELLANEOUS) ×2 IMPLANT
DERMABOND ADVANCED (GAUZE/BANDAGES/DRESSINGS) ×1
DERMABOND ADVANCED .7 DNX12 (GAUZE/BANDAGES/DRESSINGS) ×1 IMPLANT
DIFFUSER DRILL AIR PNEUMATIC (MISCELLANEOUS) ×2 IMPLANT
DRAPE INCISE IOBAN 85X60 (DRAPES) ×2 IMPLANT
DRAPE ORTHO SPLIT 77X108 STRL (DRAPES) ×4
DRAPE POUCH INSTRU U-SHP 10X18 (DRAPES) ×2 IMPLANT
DRAPE SURG 17X23 STRL (DRAPES) IMPLANT
DRAPE SURG ORHT 6 SPLT 77X108 (DRAPES) ×1 IMPLANT
DRSG OPSITE POSTOP 4X6 (GAUZE/BANDAGES/DRESSINGS) ×1 IMPLANT
ELECT CAUTERY BLADE 6.4 (BLADE) ×2 IMPLANT
ELECT REM PT RETURN 9FT ADLT (ELECTROSURGICAL) ×2
ELECTRODE REM PT RTRN 9FT ADLT (ELECTROSURGICAL) ×1 IMPLANT
GAUZE SPONGE 4X4 12PLY STRL (GAUZE/BANDAGES/DRESSINGS) ×2 IMPLANT
GAUZE SPONGE 4X4 16PLY XRAY LF (GAUZE/BANDAGES/DRESSINGS) ×2 IMPLANT
GLOVE BIO SURGEON STRL SZ 6.5 (GLOVE) IMPLANT
GLOVE BIO SURGEON STRL SZ7 (GLOVE) IMPLANT
GLOVE BIO SURGEON STRL SZ7.5 (GLOVE) IMPLANT
GLOVE BIO SURGEON STRL SZ8 (GLOVE) ×2 IMPLANT
GLOVE BIO SURGEON STRL SZ8.5 (GLOVE) IMPLANT
GLOVE BIOGEL M 8.0 STRL (GLOVE) IMPLANT
GLOVE ECLIPSE 6.5 STRL STRAW (GLOVE) IMPLANT
GLOVE ECLIPSE 7.0 STRL STRAW (GLOVE) IMPLANT
GLOVE ECLIPSE 7.5 STRL STRAW (GLOVE) IMPLANT
GLOVE ECLIPSE 8.0 STRL XLNG CF (GLOVE) IMPLANT
GLOVE ECLIPSE 8.5 STRL (GLOVE) IMPLANT
GLOVE EXAM NITRILE LRG STRL (GLOVE) IMPLANT
GLOVE EXAM NITRILE XL STR (GLOVE) IMPLANT
GLOVE EXAM NITRILE XS STR PU (GLOVE) IMPLANT
GLOVE INDICATOR 6.5 STRL GRN (GLOVE) IMPLANT
GLOVE INDICATOR 7.0 STRL GRN (GLOVE) IMPLANT
GLOVE INDICATOR 7.5 STRL GRN (GLOVE) IMPLANT
GLOVE INDICATOR 8.0 STRL GRN (GLOVE) IMPLANT
GLOVE INDICATOR 8.5 STRL (GLOVE) ×2 IMPLANT
GLOVE OPTIFIT SS 8.0 STRL (GLOVE) IMPLANT
GLOVE SURG SS PI 6.5 STRL IVOR (GLOVE) IMPLANT
GOWN STRL REUS W/ TWL LRG LVL3 (GOWN DISPOSABLE) ×1 IMPLANT
GOWN STRL REUS W/ TWL XL LVL3 (GOWN DISPOSABLE) ×1 IMPLANT
GOWN STRL REUS W/TWL 2XL LVL3 (GOWN DISPOSABLE) ×2 IMPLANT
GOWN STRL REUS W/TWL LRG LVL3 (GOWN DISPOSABLE) ×2
GOWN STRL REUS W/TWL XL LVL3 (GOWN DISPOSABLE) ×2
KIT BASIN OR (CUSTOM PROCEDURE TRAY) ×2 IMPLANT
KIT ROOM TURNOVER OR (KITS) ×2 IMPLANT
NDL HYPO 25X1 1.5 SAFETY (NEEDLE) ×1 IMPLANT
NEEDLE HYPO 25X1 1.5 SAFETY (NEEDLE) ×2 IMPLANT
NS IRRIG 1000ML POUR BTL (IV SOLUTION) ×2 IMPLANT
OIL CARTRIDGE MAESTRO DRILL (MISCELLANEOUS) ×2
PACK EENT II TURBAN DRAPE (CUSTOM PROCEDURE TRAY) ×2 IMPLANT
PAD ARMBOARD 7.5X6 YLW CONV (MISCELLANEOUS) ×2 IMPLANT
PASSER CATH 65CM DISP (NEUROSURGERY SUPPLIES) ×2 IMPLANT
PATTIES SURGICAL .5 X.5 (GAUZE/BANDAGES/DRESSINGS) IMPLANT
PATTIES SURGICAL .5 X3 (DISPOSABLE) IMPLANT
PATTIES SURGICAL .75X.75 (GAUZE/BANDAGES/DRESSINGS) IMPLANT
PENCIL BUTTON HOLSTER BLD 10FT (ELECTRODE) ×2 IMPLANT
SHEATH PERITONEAL INTRO 46 (MISCELLANEOUS) IMPLANT
SHEATH PERITONEAL INTRO 61 (MISCELLANEOUS) ×2 IMPLANT
SPONGE LAP 4X18 X RAY DECT (DISPOSABLE) ×2 IMPLANT
SPONGE SURGIFOAM ABS GEL SZ50 (HEMOSTASIS) ×2 IMPLANT
STAPLER SKIN PROX WIDE 3.9 (STAPLE) ×2 IMPLANT
STAPLER VISISTAT 35W (STAPLE) ×2 IMPLANT
STRIP CLOSURE SKIN 1/2X4 (GAUZE/BANDAGES/DRESSINGS) ×4 IMPLANT
SUT BONE WAX W31G (SUTURE) ×2 IMPLANT
SUT CHROMIC 3 0 PS 2 (SUTURE) ×2 IMPLANT
SUT SILK 0 TIES 10X30 (SUTURE) ×2 IMPLANT
SUT VIC AB 2-0 CT1 18 (SUTURE) ×3 IMPLANT
SUT VICRYL 4-0 PS2 18IN ABS (SUTURE) ×2 IMPLANT
SYR 5ML LL (SYRINGE) IMPLANT
SYR CONTROL 10ML LL (SYRINGE) ×2 IMPLANT
TOWEL OR 17X24 6PK STRL BLUE (TOWEL DISPOSABLE) ×2 IMPLANT
TOWEL OR 17X26 10 PK STRL BLUE (TOWEL DISPOSABLE) ×2 IMPLANT
TUBE CONNECTING 12X1/4 (SUCTIONS) ×2 IMPLANT
UNDERPAD 30X30 (UNDERPADS AND DIAPERS) ×2 IMPLANT
WATER STERILE IRR 1000ML POUR (IV SOLUTION) ×2 IMPLANT

## 2016-07-10 NOTE — Progress Notes (Signed)
Patient ID: Katrina Fernandez, female   DOB: 11-Jun-1965, 51 y.o.   MRN: [id number] Patient significantly improved on IV Decadron  Awake alert neurologically nonfocal and intact  Plan OR removal of lumbar peritoneal shunt today.

## 2016-07-10 NOTE — Progress Notes (Signed)
Pt returned from surgical procedure. Honeycomb dressing clean, dry, and intact. Pt lying flat in bed. Husband at bedside. Will continue to monitor.

## 2016-07-10 NOTE — Anesthesia Preprocedure Evaluation (Addendum)
Anesthesia Evaluation  Patient identified by MRN, date of birth, ID band Patient awake    Reviewed: Allergy & Precautions, NPO status , Patient's Chart, lab work & pertinent test results  History of Anesthesia Complications (+) PONV  Airway Mallampati: I  TM Distance: >3 FB Neck ROM: Full    Dental  (+) Dental Advisory Given   Pulmonary neg pulmonary ROS,    breath sounds clear to auscultation       Cardiovascular (-) angina+ DVT   Rhythm:Regular Rate:Normal     Neuro/Psych Depression Chronic pain: narcotics Malfunctioning spinal drain (placed s/p back surgery-13 years ago)    GI/Hepatic Neg liver ROS, GERD  Medicated and Controlled,  Endo/Other  Morbid obesity  Renal/GU negative Renal ROS     Musculoskeletal  (+) Fibromyalgia -, narcotic dependent  Abdominal (+) + obese,   Peds  Hematology negative hematology ROS (+)   Anesthesia Other Findings   Reproductive/Obstetrics                            Anesthesia Physical Anesthesia Plan  ASA: III  Anesthesia Plan: General   Post-op Pain Management:    Induction: Intravenous  Airway Management Planned: Oral ETT  Additional Equipment:   Intra-op Plan:   Post-operative Plan: Extubation in OR  Informed Consent: I have reviewed the patients History and Physical, chart, labs and discussed the procedure including the risks, benefits and alternatives for the proposed anesthesia with the patient or authorized representative who has indicated his/her understanding and acceptance.   Dental advisory given  Plan Discussed with: CRNA and Surgeon  Anesthesia Plan Comments: (Plan routine monitors, GETA)        Anesthesia Quick Evaluation

## 2016-07-10 NOTE — Progress Notes (Signed)
Surgical procedure completed. Received report from PACU nurse.

## 2016-07-10 NOTE — Anesthesia Postprocedure Evaluation (Signed)
Anesthesia Post Note  Patient: Katrina Fernandez  Procedure(s) Performed: Procedure(s) (LRB): SHUNT REMOVAL (N/A)  Patient location during evaluation: PACU Anesthesia Type: General Level of consciousness: awake and alert, oriented and patient cooperative Pain management: pain level controlled Vital Signs Assessment: post-procedure vital signs reviewed and stable Respiratory status: spontaneous breathing, nonlabored ventilation, respiratory function stable and patient connected to nasal cannula oxygen Cardiovascular status: blood pressure returned to baseline and stable Postop Assessment: no signs of nausea or vomiting Anesthetic complications: no       Last Vitals:  Vitals:   07/10/16 1447 07/10/16 1502  BP: 137/78 126/80  Pulse: 88 91  Resp: 14 15  Temp:      Last Pain:  Vitals:   07/10/16 1523  TempSrc:   PainSc: Asleep                 Annalysse Shoemaker,E. Cleavon Goldman

## 2016-07-10 NOTE — Op Note (Signed)
Preoperative diagnosis: 10 mm tonsillar descension consistent with an acquired Chiari malformation from over drainage from a lumbar peritoneal shunt  Postoperative diagnosis: Same  Procedure: Removal of lumbar peritoneal shunt  Surgeon: [name] Nathan Stallworth  Anesthesia: Gen.  EBL: Minimal  History of present illness: Patient is a 51 year old female who 15 years ago had placement of a lumbar peroneal shunt presented to the emergency last night with 4-5 days of severe progressive postural and positional headaches. These refractory to all forms of conservative treatment workup with CT scan showed a significant progression of a Chiari malformation with 10 mm tonsillar descension. This was significantly worse then MRI scan of her cervical spine from 4 years ago showing only approximately 2-3 mm of tonsils below the foramen magnum with CSF space around her cervical medullary junction. So due to the progression of clinical syndrome imaging findings have recommended removal of lumbar peritoneal shunt. I extensively went over the risks and benefits of the procedure with her as well as perioperative course expectations of outcome and alternatives of surgery and she understood and agreed to proceed forward.  Operative procedure: Patient brought into the or was induced under general anesthesia positioned prone with chest rolls and hip rolls back side of her lumbar spine overlying the lumbar incision was prepped and draped in routine sterile fashion after infiltration 10 mL lidocaine with epi a midline incision was made and Bovie the car was used to cut subcutaneous tissues and and this was dissected free until identified the lumbar catheter entering the lumbodorsal fascia dissected this free and then with appear hemostats and pulled both a lumbar intrathecal portion and the peritoneal portion out through the wound in sterile fashion. Catheter was intact in its entirety the wounds and copious irrigated then at the site of  exit with there was a little bit of organized fistulous tract I oversewed this with 2 figure-of-eight after electrocautery was used to burn it and serrated. The wounds and closed with after Vicryl and a running 4 subcuticular in the skin Dermabond benzo and Steri-Strips and sterile dressings applied patient recovered in stable condition. At the end of case all needle counts sponge counts were correct.

## 2016-07-10 NOTE — Transfer of Care (Signed)
Immediate Anesthesia Transfer of Care Note  Patient: Katrina Fernandez  Procedure(s) Performed: Procedure(s) with comments: SHUNT REMOVAL (N/A) - SHUNT REMOVAL  Patient Location: PACU  Anesthesia Type:General  Level of Consciousness: awake, alert , oriented and sedated  Airway & Oxygen Therapy: Patient Spontanous Breathing and Patient connected to nasal cannula oxygen  Post-op Assessment: Report given to RN, Post -op Vital signs reviewed and stable and Patient moving all extremities  Post vital signs: Reviewed and stable  Last Vitals:  Vitals:   07/10/16 0905 07/10/16 1432  BP: 120/71 133/79  Pulse: 80 92  Resp: 20 17  Temp: 36.6 C 36.4 C    Last Pain:  Vitals:   07/10/16 1058  TempSrc:   PainSc: Asleep         Complications: No apparent anesthesia complications

## 2016-07-10 NOTE — Progress Notes (Signed)
Pt observed to be very nauseous, having dry hives, had iv 4mg  zofran at 1649, same repeated at 2021, pt becoming very anxious and requesting to speak with the doctor, Dr Cyndy Freeze (on call) paged, PA Mr Roseanne Reno called back and consulted with Dr Cyndy Freeze who said to add iv Phenergan 25mg  to her medications if the nausea and vomiting persisted, pt reassured, will however continue to monitor. Obasogie-Asidi, Mattison Golay Efe

## 2016-07-10 NOTE — Progress Notes (Signed)
Received call that patient was suffering from nausea since returning from surgery. Reported she is neurologically intact. Zofran has not worked. Will try Promethazine 25mg  q 6-8 as needed. Advised to call with further concerns or for worsening neurological exam.

## 2016-07-10 NOTE — Progress Notes (Signed)
Received verbal order from Md for pain.

## 2016-07-10 NOTE — Progress Notes (Signed)
Pt requested pain medication. Pt currently NPO for surgical procedure. Paged MD to get order for IV pain med, awaiting call back.

## 2016-07-10 NOTE — Anesthesia Procedure Notes (Signed)
Procedure Name: Intubation Date/Time: 07/10/2016 1:20 PM Performed by: Scheryl Darter Pre-anesthesia Checklist: Patient identified, Emergency Drugs available, Suction available and Patient being monitored Patient Re-evaluated:Patient Re-evaluated prior to inductionOxygen Delivery Method: Circle System Utilized Preoxygenation: Pre-oxygenation with 100% oxygen Intubation Type: IV induction Ventilation: Mask ventilation without difficulty Laryngoscope Size: Miller and 2 Grade View: Grade I Tube type: Oral Tube size: 7.5 mm Number of attempts: 1 Airway Equipment and Method: Stylet and Oral airway Placement Confirmation: ETT inserted through vocal cords under direct vision,  positive ETCO2 and breath sounds checked- equal and bilateral Secured at: 22 cm Tube secured with: Tape Dental Injury: Teeth and Oropharynx as per pre-operative assessment

## 2016-07-11 ENCOUNTER — Encounter (HOSPITAL_COMMUNITY): Payer: Self-pay | Admitting: Neurosurgery

## 2016-07-11 MED ORDER — PANTOPRAZOLE SODIUM 40 MG PO TBEC
40.0000 mg | DELAYED_RELEASE_TABLET | Freq: Every day | ORAL | Status: DC
  Administered 2016-07-11 – 2016-07-12 (×2): 40 mg via ORAL
  Filled 2016-07-11 (×2): qty 1

## 2016-07-11 NOTE — Progress Notes (Signed)
Patient ID: Katrina Fernandez, female   DOB: July 04, 1965, 51 y.o.   MRN: [id number] Subjective: Patient reports headache, but feels worse lying flat  Objective: Vital signs in last 24 hours: Temp:  [97.6 F (36.4 C)-98.2 F (36.8 C)] 98.2 F (36.8 C) (02/24 0552) Pulse Rate:  [66-120] 66 (02/24 0552) Resp:  [14-20] 16 (02/24 0552) BP: (100-137)/(47-85) 125/63 (02/24 0552) SpO2:  [95 %-100 %] 98 % (02/24 0552)  Intake/Output from previous day: 02/23 0701 - 02/24 0700 In: 1107 [P.O.:240; I.V.:617; IV Piggyback:250] Out: 405 [Urine:400; Blood:5] Intake/Output this shift: No intake/output data recorded.  Exam normal, dressing dry  Lab Results: Lab Results  Component Value Date   WBC 12.7 (H) 07/10/2016   HGB 13.8 07/10/2016   HCT 43.1 07/10/2016   MCV 90.2 07/10/2016   PLT 265 07/10/2016   Lab Results  Component Value Date   INR 1.05 07/10/2016   BMET Lab Results  Component Value Date   NA 141 07/10/2016   K 4.3 07/10/2016   CL 106 07/10/2016   CO2 25 07/10/2016   GLUCOSE 167 (H) 07/10/2016   BUN 8 07/10/2016   CREATININE 0.88 07/10/2016   CALCIUM 9.1 07/10/2016    Studies/Results: Ct Head W & Wo Contrast  Result Date: 07/09/2016 CLINICAL DATA:  Headaches for 4 days, worse with standing. EXAM: CT HEAD WITHOUT AND WITH CONTRAST TECHNIQUE: Contiguous axial images were obtained from the base of the skull through the vertex without and with intravenous contrast CONTRAST:  28mL ISOVUE-300 IOPAMIDOL (ISOVUE-300) INJECTION 61% COMPARISON:  CT head 08/18/2009.  MRI cervical spine 03/11/2013. FINDINGS: Brain: No evidence for acute infarction, hemorrhage, mass lesion, hydrocephalus, or extra-axial fluid. Normal cerebral volume. No white matter disease. Subcentimeter osteoma or meningioma, RIGHT parietal convexity, may be slightly more calcified but not clearly larger, compared with 2011. No adjacent edema. Post infusion, no abnormal enhancement. Tonsillar herniation, better  evaluated on MR, redemonstrated, estimated 10 mm descent below can imaginary line across the foramen magnum. Vascular: No hyperdense vessel or unexpected calcification. Visible vessels are patent. Skull: Normal. Negative for fracture or focal lesion. Sinuses/Orbits: No acute finding. Other: None. IMPRESSION: No acute intracranial findings. No abnormal postcontrast enhancement. Tonsillar descent of 10 mm, consistent with Chiari 1 malformation. This may be increased from 2014. Electronically Signed   By: Staci Righter M.D.   On: 07/09/2016 13:35    Assessment/Plan: Mobilize today   LOS: 2 days    Kinan Safley S 07/11/2016, 8:01 AM

## 2016-07-12 ENCOUNTER — Other Ambulatory Visit: Payer: Self-pay | Admitting: Family Medicine

## 2016-07-12 MED ORDER — PRAMIPEXOLE DIHYDROCHLORIDE 0.125 MG PO TABS
0.3750 mg | ORAL_TABLET | Freq: Every day | ORAL | Status: DC
  Administered 2016-07-13 – 2016-07-14 (×2): 0.375 mg via ORAL
  Filled 2016-07-12 (×2): qty 3

## 2016-07-12 MED ORDER — DOCUSATE SODIUM 100 MG PO CAPS
100.0000 mg | ORAL_CAPSULE | Freq: Two times a day (BID) | ORAL | Status: DC
  Administered 2016-07-12 – 2016-07-15 (×7): 100 mg via ORAL
  Filled 2016-07-12 (×6): qty 1

## 2016-07-12 MED ORDER — POLYETHYLENE GLYCOL 3350 17 G PO PACK
17.0000 g | PACK | Freq: Every day | ORAL | Status: DC
  Administered 2016-07-13 – 2016-07-15 (×3): 17 g via ORAL
  Filled 2016-07-12 (×4): qty 1

## 2016-07-12 NOTE — Progress Notes (Signed)
Pt seen and examined.  No issues overnight. Reports continued HA but no worsening Denies changes in vision Minimal appetite Urinating well Ambulating to restroom.   EXAM: Temp:  [97.6 F (36.4 C)-98.2 F (36.8 C)] 98.1 F (36.7 C) (02/25 0533) Pulse Rate:  [54-61] 58 (02/25 0533) Resp:  [16] 16 (02/25 0533) BP: (93-117)/(53-67) 105/60 (02/25 0533) SpO2:  [93 %-100 %] 97 % (02/25 0533) Intake/Output      02/24 0701 - 02/25 0700 02/25 0701 - 02/26 0700   P.O.     I.V. (mL/kg)     IV Piggyback     Total Intake(mL/kg)     Urine (mL/kg/hr)     Emesis/NG output     Blood     Total Output       Net            Urine Occurrence 3 x     Awake and alert Follows commands throughout Full strength CN grossly intact.   Plan Stable Continue current care Mobilize today

## 2016-07-13 MED ORDER — PANTOPRAZOLE SODIUM 40 MG PO TBEC
40.0000 mg | DELAYED_RELEASE_TABLET | Freq: Two times a day (BID) | ORAL | Status: DC
  Administered 2016-07-13 – 2016-07-15 (×4): 40 mg via ORAL
  Filled 2016-07-13 (×5): qty 1

## 2016-07-13 MED ORDER — DEXAMETHASONE SODIUM PHOSPHATE 10 MG/ML IJ SOLN
10.0000 mg | Freq: Four times a day (QID) | INTRAMUSCULAR | Status: DC
  Administered 2016-07-13 – 2016-07-15 (×9): 10 mg via INTRAVENOUS
  Filled 2016-07-13 (×9): qty 1

## 2016-07-13 NOTE — Care Management Important Message (Signed)
Important Message  Patient Details  Name: Katrina Fernandez MRN: [id number] Date of Birth: 24-Jul-1965   Medicare Important Message Given:  Yes    Jameel Quant 07/13/2016, 3:12 PM

## 2016-07-13 NOTE — Progress Notes (Signed)
Patient ID: Katrina Fernandez, female   DOB: [birth year], 51 y.o.   MRN: [id number] Still with headache and right-sided periorbital location improve nausea no vision changes  Awake alert incision clean dry and intact neurologically nonfocal  We'll start Decadron she did better with this prior to surgery.

## 2016-07-13 NOTE — Care Management Note (Signed)
Case Management Note  Patient Details  Name: Katrina Fernandez MRN: [id number] Date of Birth: 1965-06-22  Subjective/Objective:                    Action/Plan: Plan is for d/c home when medically stable. Pt started on IV decadron today. CM following.   Expected Discharge Date:                  Expected Discharge Plan:     In-House Referral:     Discharge planning Services     Post Acute Care Choice:    Choice offered to:     DME Arranged:    DME Agency:     HH Arranged:    HH Agency:     Status of Service:     If discussed at [employer], dates discussed:    Additional Comments:  Pollie Friar, RN 07/13/2016, 3:47 PM

## 2016-07-13 NOTE — Consult Note (Signed)
[facility] CM Primary Care Navigator  07/13/2016  Katrina Fernandez 05-Sep-1965 [id number]  Met with patient at the bedside to identify possible discharge needs. Patient reports sudden, unusual headaches that had led to this admission.  Patient endorses Dr. Sherene Sires with [facility] as the primary care provider.    Patient shared using [facility] in [place] to obtain medications without any problem.   Patient manages her own medications at home using "pill box" system weekly.   She reports driving prior to admission, however, mother Katrina Fernandez lives with them) can provide transportation to her doctors' appointments as needed.  Husband Katrina Fernandez) will be her primary caregiver at home.   Anticipated discharge plan is home according to patient.  Patient expressed understanding to call primary care provider's office when she returns home, for a post discharge follow-up appointment within a week or sooner if needs arise. Patient letter (with PCP's contact number) was provided as her reminder.  She denies any other health management needs or concerns at this time.  For additional questions please contact:  Katrina Fernandez, BSN, RN-BC [facility] PRIMARY CARE Navigator Cell: [phone]

## 2016-07-14 LAB — CATH TIP CULTURE: Culture: NO GROWTH

## 2016-07-14 NOTE — Progress Notes (Signed)
Patient ID: Katrina Fernandez, female   DOB: 24-May-1965, 50 y.o.   MRN: [id number] Headaches slightly better on steroids patient handling in the room  Awake alert oriented strength 5 out of 5 incision clean dry and intact   progressive mobilization continued Decadron

## 2016-07-15 ENCOUNTER — Encounter (HOSPITAL_COMMUNITY): Payer: Self-pay | Admitting: Student

## 2016-07-15 MED ORDER — OXYCODONE HCL 5 MG PO TABS: 5.0000 mg | ORAL_TABLET | Freq: Four times a day (QID) | ORAL | 0 refills | Status: DC | PRN

## 2016-07-15 MED ORDER — METHYLPREDNISOLONE 4 MG PO TBPK: ORAL_TABLET | ORAL | 0 refills | Status: DC

## 2016-07-15 MED ORDER — CYCLOBENZAPRINE HCL 10 MG PO TABS: 10.0000 mg | ORAL_TABLET | Freq: Three times a day (TID) | ORAL | 0 refills | Status: DC | PRN

## 2016-07-15 NOTE — Progress Notes (Signed)
Pt discharging at this time with her husband taking all personal belongings. IV discontinued, dry dressing applied. Discharge instructions with prescriptions  provided with verbal understanding. No noted distress.

## 2016-07-15 NOTE — Care Management Note (Signed)
Case Management Note  Patient Details  Name: Katrina Fernandez MRN: [id number] Date of Birth: [birth year]  Subjective/Objective:                    Action/Plan: Pt discharging home with self care. Pt with PCP and insurance and transportation home. No further needs per CM.   Expected Discharge Date:  07/15/16               Expected Discharge Plan:  Home/Self Care  In-House Referral:     Discharge planning Services     Post Acute Care Choice:    Choice offered to:     DME Arranged:    DME Agency:     HH Arranged:    HH Agency:     Status of Service:  Completed, signed off  If discussed at [employer] of [employer] Meetings, dates discussed:    Additional Comments:  Pollie Friar, RN 07/15/2016, 10:18 AM

## 2016-07-15 NOTE — Discharge Summary (Signed)
Physician Discharge Summary  Patient ID: Katrina Fernandez MRN: [id number] DOB/AGE: 27-May-1965 51 y.o.  Admit date: 07/09/2016 Discharge date: 07/15/2016  Admission Diagnoses: headaches   Discharge Diagnoses: headaches   Discharged Condition: good  Hospital Course: The patient was admitted on 07/09/2016 and taken to the operating room where the patient underwent lumbar drain removal. The patient tolerated the procedure well and was taken to the recovery room and then to the floor in stable condition. The hospital course was routine. There were no complications. The wound remained clean dry and intact. Pt had considerable positional headache for several days but improved overtime. The patient remained afebrile with stable vital signs, and tolerated a regular diet. The patient continued to increase activities, and pain was well controlled with oral pain medications.   Consults: None  Significant Diagnostic Studies:  Results for orders placed or performed during the hospital encounter of 07/09/16  MRSA PCR Screening  Result Value Ref Range   MRSA by PCR NEGATIVE NEGATIVE  Cath Tip Culture  Result Value Ref Range   Specimen Description CATH TIP    Special Requests NONE    Culture NO GROWTH 3 DAYS    Report Status 07/14/2016 FINAL   CBC with Differential/Platelet  Result Value Ref Range   WBC 10.4 4.0 - 10.5 K/uL   RBC 4.97 3.87 - 5.11 MIL/uL   Hemoglobin 15.2 (H) 12.0 - 15.0 g/dL   HCT 44.6 36.0 - 46.0 %   MCV 89.7 78.0 - 100.0 fL   MCH 30.6 26.0 - 34.0 pg   MCHC 34.1 30.0 - 36.0 g/dL   RDW 12.4 11.5 - 15.5 %   Platelets 289 150 - 400 K/uL   Neutrophils Relative % 70 %   Neutro Abs 7.4 1.7 - 7.7 K/uL   Lymphocytes Relative 20 %   Lymphs Abs 2.0 0.7 - 4.0 K/uL   Monocytes Relative 8 %   Monocytes Absolute 0.8 0.1 - 1.0 K/uL   Eosinophils Relative 1 %   Eosinophils Absolute 0.1 0.0 - 0.7 K/uL   Basophils Relative 1 %   Basophils Absolute 0.1 0.0 - 0.1 K/uL  Basic metabolic  panel  Result Value Ref Range   Sodium 141 135 - 145 mmol/L   Potassium 3.9 3.5 - 5.1 mmol/L   Chloride 103 101 - 111 mmol/L   CO2 27 22 - 32 mmol/L   Glucose, Bld 84 65 - 99 mg/dL   BUN 6 6 - 20 mg/dL   Creatinine, Ser 0.75 0.44 - 1.00 mg/dL   Calcium 9.5 8.9 - 10.3 mg/dL   GFR calc non Af Amer >60 >60 mL/min   GFR calc Af Amer >60 >60 mL/min   Anion gap 11 5 - 15  Protime-INR  Result Value Ref Range   Prothrombin Time 12.5 11.4 - 15.2 seconds   INR 0.93   CBC  Result Value Ref Range   WBC 12.7 (H) 4.0 - 10.5 K/uL   RBC 4.78 3.87 - 5.11 MIL/uL   Hemoglobin 13.8 12.0 - 15.0 g/dL   HCT 43.1 36.0 - 46.0 %   MCV 90.2 78.0 - 100.0 fL   MCH 28.9 26.0 - 34.0 pg   MCHC 32.0 30.0 - 36.0 g/dL   RDW 12.4 11.5 - 15.5 %   Platelets 265 150 - 400 K/uL  Basic Metabolic Panel  Result Value Ref Range   Sodium 141 135 - 145 mmol/L   Potassium 4.3 3.5 - 5.1 mmol/L   Chloride 106 101 -  111 mmol/L   CO2 25 22 - 32 mmol/L   Glucose, Bld 167 (H) 65 - 99 mg/dL   BUN 8 6 - 20 mg/dL   Creatinine, Ser 0.88 0.44 - 1.00 mg/dL   Calcium 9.1 8.9 - 10.3 mg/dL   GFR calc non Af Amer >60 >60 mL/min   GFR calc Af Amer >60 >60 mL/min   Anion gap 10 5 - 15  Protime-INR  Result Value Ref Range   Prothrombin Time 13.7 11.4 - 15.2 seconds   INR 1.05     Dg Ankle Complete Right  Result Date: 07/03/2016 CLINICAL DATA:  Fall. EXAM: RIGHT ANKLE - COMPLETE 3+ VIEW COMPARISON:  No recent prior. FINDINGS: Diffuse soft tissue swelling. No evidence of fracture or dislocation. No acute bony or joint abnormality identified. Diffuse degenerative change. IMPRESSION: 1.  Diffuse soft tissue swelling.  No acute bony abnormality. 2.  Diffuse degenerative change. Electronically Signed   By: Marcello Moores  Register   On: 07/03/2016 11:20   Ct Head W & Wo Contrast  Result Date: 07/09/2016 CLINICAL DATA:  Headaches for 4 days, worse with standing. EXAM: CT HEAD WITHOUT AND WITH CONTRAST TECHNIQUE: Contiguous axial images were  obtained from the base of the skull through the vertex without and with intravenous contrast CONTRAST:  48mL ISOVUE-300 IOPAMIDOL (ISOVUE-300) INJECTION 61% COMPARISON:  CT head 08/18/2009.  MRI cervical spine 03/11/2013. FINDINGS: Brain: No evidence for acute infarction, hemorrhage, mass lesion, hydrocephalus, or extra-axial fluid. Normal cerebral volume. No white matter disease. Subcentimeter osteoma or meningioma, RIGHT parietal convexity, may be slightly more calcified but not clearly larger, compared with 2011. No adjacent edema. Post infusion, no abnormal enhancement. Tonsillar herniation, better evaluated on MR, redemonstrated, estimated 10 mm descent below can imaginary line across the foramen magnum. Vascular: No hyperdense vessel or unexpected calcification. Visible vessels are patent. Skull: Normal. Negative for fracture or focal lesion. Sinuses/Orbits: No acute finding. Other: None. IMPRESSION: No acute intracranial findings. No abnormal postcontrast enhancement. Tonsillar descent of 10 mm, consistent with Chiari 1 malformation. This may be increased from 2014. Electronically Signed   By: Staci Righter M.D.   On: 07/09/2016 13:35    Antibiotics:  Anti-infectives    Start     Dose/Rate Route Frequency Ordered Stop   07/11/16 1400  vancomycin (VANCOCIN) IVPB 1000 mg/200 mL premix     1,000 mg 200 mL/hr over 60 Minutes Intravenous  Once 07/10/16 1706 07/11/16 1506   07/10/16 1700  ceFAZolin (ANCEF) IVPB 2g/100 mL premix  Status:  Discontinued     2 g 200 mL/hr over 30 Minutes Intravenous Every 8 hours 07/10/16 1647 07/10/16 1706   07/10/16 1401  bacitracin 50,000 Units in sodium chloride irrigation 0.9 % 500 mL irrigation  Status:  Discontinued       As needed 07/10/16 1402 07/10/16 1428   07/10/16 1300  vancomycin (VANCOCIN) IVPB 1000 mg/200 mL premix  Status:  Discontinued     1,000 mg 200 mL/hr over 60 Minutes Intravenous To Surgery 07/10/16 1259 07/10/16 1604      Discharge  Exam: Blood pressure 131/72, pulse 79, temperature 98.3 F (36.8 C), temperature source Oral, resp. rate 18, height 5\' 3"  (1.6 m), weight 101.8 kg (224 lb 6.9 oz), SpO2 100 %. Neurologic: Grossly normal Dressing dry  Discharge Medications:  Oxy ir given, otherwise continue home medications   Disposition: home   Final Dx: lumbar shunt removal  Discharge Instructions    Call MD for:  difficulty breathing, headache or  visual disturbances    Complete by:  As directed    Call MD for:  persistant nausea and vomiting    Complete by:  As directed    Call MD for:  redness, tenderness, or signs of infection (pain, swelling, redness, odor or green/yellow discharge around incision site)    Complete by:  As directed    Call MD for:  severe uncontrolled pain    Complete by:  As directed    Call MD for:  temperature >100.4    Complete by:  As directed    Diet - low sodium heart healthy    Complete by:  As directed    Increase activity slowly    Complete by:  As directed    Remove dressing in 24 hours    Complete by:  As directed       Follow-up Information    CRAM,GARY P, MD. Schedule an appointment as soon as possible for a visit in 2 week(s).   Specialty:  Neurosurgery Contact information: [address] [place] [address] [phone]            Signed: Eustace Moore 07/15/2016, 9:12 AM

## 2016-07-15 NOTE — Progress Notes (Signed)
NEUROSURGERY PROGRESS NOTE  Doing well. Says that she had a good day all day yesterday and her headache came back 10/10 last night around 1900. Pain medication and steroids working well at this time.  A&Ox4 No numbness, tingling or weakness Ambulating and voiding well Good strength and sensation Incision CDI  Temp:  [97.7 F (36.5 C)-98 F (36.7 C)] 97.9 F (36.6 C) (02/28 0550) Pulse Rate:  [60-82] 67 (02/28 0550) Resp:  [17-18] 18 (02/28 0550) BP: (103-145)/(59-76) 128/76 (02/28 0550) SpO2:  [94 %-99 %] 96 % (02/28 0550)  Plan: Continue mobilizing and pain medication as needed.   Katrina Chiquito, NP 07/15/2016 7:20 AM

## 2016-07-23 ENCOUNTER — Observation Stay (HOSPITAL_COMMUNITY): Payer: Medicare Other

## 2016-07-23 ENCOUNTER — Encounter (HOSPITAL_COMMUNITY)
Admission: RE | Disposition: A | Discharge status: Home / Self Care | Payer: Self-pay | Source: Ambulatory Visit | Attending: Neurosurgery

## 2016-07-23 ENCOUNTER — Inpatient Hospital Stay (HOSPITAL_COMMUNITY): Payer: Medicare Other

## 2016-07-23 ENCOUNTER — Inpatient Hospital Stay (HOSPITAL_COMMUNITY): Payer: Medicare Other | Admitting: Certified Registered Nurse Anesthetist

## 2016-07-23 ENCOUNTER — Encounter (HOSPITAL_COMMUNITY): Payer: Self-pay | Admitting: General Practice

## 2016-07-23 ENCOUNTER — Inpatient Hospital Stay (HOSPITAL_COMMUNITY)
Admission: RE | Admit: 2016-07-23 | Discharge: 2016-07-30 | DRG: 028 | Disposition: A | Discharge status: Home / Self Care | Payer: Medicare Other | Source: Ambulatory Visit | Attending: Neurosurgery | Admitting: Neurosurgery

## 2016-07-23 DIAGNOSIS — E785 Hyperlipidemia, unspecified: Secondary | ICD-10-CM | POA: Diagnosis present

## 2016-07-23 DIAGNOSIS — G9782 Other postprocedural complications and disorders of nervous system: Principal | ICD-10-CM | POA: Diagnosis present

## 2016-07-23 DIAGNOSIS — F329 Major depressive disorder, single episode, unspecified: Secondary | ICD-10-CM | POA: Diagnosis present

## 2016-07-23 DIAGNOSIS — G9600 Cerebrospinal fluid leak, unspecified: Secondary | ICD-10-CM | POA: Diagnosis present

## 2016-07-23 DIAGNOSIS — R402143 Coma scale, eyes open, spontaneous, at hospital admission: Secondary | ICD-10-CM | POA: Diagnosis present

## 2016-07-23 DIAGNOSIS — K219 Gastro-esophageal reflux disease without esophagitis: Secondary | ICD-10-CM | POA: Diagnosis present

## 2016-07-23 DIAGNOSIS — R402363 Coma scale, best motor response, obeys commands, at hospital admission: Secondary | ICD-10-CM | POA: Diagnosis present

## 2016-07-23 DIAGNOSIS — H53149 Visual discomfort, unspecified: Secondary | ICD-10-CM | POA: Diagnosis present

## 2016-07-23 DIAGNOSIS — Z8041 Family history of malignant neoplasm of ovary: Secondary | ICD-10-CM

## 2016-07-23 DIAGNOSIS — G894 Chronic pain syndrome: Secondary | ICD-10-CM | POA: Diagnosis present

## 2016-07-23 DIAGNOSIS — Z9049 Acquired absence of other specified parts of digestive tract: Secondary | ICD-10-CM

## 2016-07-23 DIAGNOSIS — G935 Compression of brain: Secondary | ICD-10-CM | POA: Diagnosis present

## 2016-07-23 DIAGNOSIS — Z86010 Personal history of colonic polyps: Secondary | ICD-10-CM | POA: Diagnosis not present

## 2016-07-23 DIAGNOSIS — K5909 Other constipation: Secondary | ICD-10-CM | POA: Diagnosis present

## 2016-07-23 DIAGNOSIS — Y753 Surgical instruments, materials and neurological devices (including sutures) associated with adverse incidents: Secondary | ICD-10-CM | POA: Diagnosis present

## 2016-07-23 DIAGNOSIS — Z880 Allergy status to penicillin: Secondary | ICD-10-CM

## 2016-07-23 DIAGNOSIS — G629 Polyneuropathy, unspecified: Secondary | ICD-10-CM | POA: Diagnosis present

## 2016-07-23 DIAGNOSIS — M549 Dorsalgia, unspecified: Secondary | ICD-10-CM | POA: Diagnosis present

## 2016-07-23 DIAGNOSIS — Z800 Family history of malignant neoplasm of digestive organs: Secondary | ICD-10-CM

## 2016-07-23 DIAGNOSIS — Z981 Arthrodesis status: Secondary | ICD-10-CM | POA: Diagnosis not present

## 2016-07-23 DIAGNOSIS — Z6839 Body mass index (BMI) 39.0-39.9, adult: Secondary | ICD-10-CM

## 2016-07-23 DIAGNOSIS — Z91018 Allergy to other foods: Secondary | ICD-10-CM

## 2016-07-23 DIAGNOSIS — Z91011 Allergy to milk products: Secondary | ICD-10-CM | POA: Diagnosis not present

## 2016-07-23 DIAGNOSIS — R51 Headache: Secondary | ICD-10-CM | POA: Diagnosis present

## 2016-07-23 DIAGNOSIS — M62838 Other muscle spasm: Secondary | ICD-10-CM | POA: Diagnosis present

## 2016-07-23 DIAGNOSIS — Z791 Long term (current) use of non-steroidal anti-inflammatories (NSAID): Secondary | ICD-10-CM | POA: Diagnosis not present

## 2016-07-23 DIAGNOSIS — R519 Headache, unspecified: Secondary | ICD-10-CM

## 2016-07-23 DIAGNOSIS — Z419 Encounter for procedure for purposes other than remedying health state, unspecified: Secondary | ICD-10-CM

## 2016-07-23 DIAGNOSIS — Y838 Other surgical procedures as the cause of abnormal reaction of the patient, or of later complication, without mention of misadventure at the time of the procedure: Secondary | ICD-10-CM | POA: Diagnosis present

## 2016-07-23 DIAGNOSIS — R402253 Coma scale, best verbal response, oriented, at hospital admission: Secondary | ICD-10-CM | POA: Diagnosis present

## 2016-07-23 HISTORY — DX: Compression of brain: G93.5

## 2016-07-23 HISTORY — DX: Fibromyalgia: M79.7

## 2016-07-23 HISTORY — PX: LUMBAR LAMINECTOMY/DECOMPRESSION MICRODISCECTOMY: SHX5026

## 2016-07-23 HISTORY — DX: Anemia, unspecified: D64.9

## 2016-07-23 HISTORY — DX: Migraine, unspecified, not intractable, without status migrainosus: G43.909

## 2016-07-23 HISTORY — DX: Other reaction to spinal and lumbar puncture: G97.1

## 2016-07-23 SURGERY — LUMBAR LAMINECTOMY/DECOMPRESSION MICRODISCECTOMY 1 LEVEL
Anesthesia: General | Site: Back

## 2016-07-23 MED ORDER — PRAMIPEXOLE DIHYDROCHLORIDE 0.125 MG PO TABS
0.3750 mg | ORAL_TABLET | Freq: Every day | ORAL | Status: DC
  Administered 2016-07-23 – 2016-07-29 (×7): 0.375 mg via ORAL
  Filled 2016-07-23 (×7): qty 3

## 2016-07-23 MED ORDER — ACETAMINOPHEN 325 MG PO TABS
650.0000 mg | ORAL_TABLET | ORAL | Status: DC | PRN
  Administered 2016-07-25: 650 mg via ORAL
  Filled 2016-07-23: qty 2

## 2016-07-23 MED ORDER — LIDOCAINE-EPINEPHRINE 1 %-1:100000 IJ SOLN
INTRAMUSCULAR | Status: DC | PRN
  Administered 2016-07-23: 10 mL

## 2016-07-23 MED ORDER — TIZANIDINE HCL 4 MG PO TABS
4.0000 mg | ORAL_TABLET | Freq: Every day | ORAL | Status: DC
  Administered 2016-07-24 – 2016-07-30 (×7): 4 mg via ORAL
  Filled 2016-07-23 (×7): qty 1

## 2016-07-23 MED ORDER — THROMBIN 5000 UNITS EX SOLR
CUTANEOUS | Status: AC
  Filled 2016-07-23: qty 10000

## 2016-07-23 MED ORDER — MONTELUKAST SODIUM 10 MG PO TABS
10.0000 mg | ORAL_TABLET | ORAL | Status: DC
  Administered 2016-07-24 – 2016-07-30 (×7): 10 mg via ORAL
  Filled 2016-07-23 (×7): qty 1

## 2016-07-23 MED ORDER — CYCLOBENZAPRINE HCL 10 MG PO TABS: 10.0000 mg | ORAL_TABLET | Freq: Three times a day (TID) | ORAL | Status: DC | PRN

## 2016-07-23 MED ORDER — SUGAMMADEX SODIUM 200 MG/2ML IV SOLN
INTRAVENOUS | Status: AC
  Filled 2016-07-23: qty 2

## 2016-07-23 MED ORDER — MIDAZOLAM HCL 5 MG/5ML IJ SOLN
INTRAMUSCULAR | Status: DC | PRN
  Administered 2016-07-23: 2 mg via INTRAVENOUS

## 2016-07-23 MED ORDER — SCOPOLAMINE 1 MG/3DAYS TD PT72
1.0000 | MEDICATED_PATCH | TRANSDERMAL | Status: DC
  Administered 2016-07-23 – 2016-07-29 (×2): 1.5 mg via TRANSDERMAL
  Filled 2016-07-23 (×3): qty 1

## 2016-07-23 MED ORDER — DEXMEDETOMIDINE HCL 200 MCG/2ML IV SOLN
INTRAVENOUS | Status: DC | PRN
  Administered 2016-07-23 (×3): 8 ug via INTRAVENOUS

## 2016-07-23 MED ORDER — LEVETIRACETAM 500 MG PO TABS
500.0000 mg | ORAL_TABLET | Freq: Two times a day (BID) | ORAL | Status: DC
  Administered 2016-07-23 – 2016-07-30 (×14): 500 mg via ORAL
  Filled 2016-07-23 (×14): qty 1

## 2016-07-23 MED ORDER — PANTOPRAZOLE SODIUM 40 MG IV SOLR
40.0000 mg | Freq: Every day | INTRAVENOUS | Status: DC
  Administered 2016-07-23 – 2016-07-24 (×2): 40 mg via INTRAVENOUS
  Filled 2016-07-23 (×2): qty 40

## 2016-07-23 MED ORDER — LIDOCAINE-EPINEPHRINE 2 %-1:100000 IJ SOLN
INTRAMUSCULAR | Status: AC
  Filled 2016-07-23: qty 1

## 2016-07-23 MED ORDER — SODIUM CHLORIDE 0.9% FLUSH
3.0000 mL | INTRAVENOUS | Status: DC | PRN
  Administered 2016-07-26: 3 mL via INTRAVENOUS
  Filled 2016-07-23: qty 3

## 2016-07-23 MED ORDER — VANCOMYCIN HCL IN DEXTROSE 1-5 GM/200ML-% IV SOLN
INTRAVENOUS | Status: AC
  Filled 2016-07-23: qty 200

## 2016-07-23 MED ORDER — ACETAMINOPHEN 650 MG RE SUPP: 650.0000 mg | RECTAL | Status: DC | PRN

## 2016-07-23 MED ORDER — ONDANSETRON HCL 4 MG/2ML IJ SOLN
INTRAMUSCULAR | Status: AC
  Filled 2016-07-23: qty 2

## 2016-07-23 MED ORDER — DEXAMETHASONE SODIUM PHOSPHATE 10 MG/ML IJ SOLN
INTRAMUSCULAR | Status: AC
  Filled 2016-07-23: qty 1

## 2016-07-23 MED ORDER — SODIUM CHLORIDE 0.9 % IR SOLN
Status: DC | PRN
  Administered 2016-07-23: 500 mL

## 2016-07-23 MED ORDER — ONDANSETRON HCL 4 MG/2ML IJ SOLN
4.0000 mg | Freq: Four times a day (QID) | INTRAMUSCULAR | Status: DC | PRN
  Administered 2016-07-24 – 2016-07-27 (×3): 4 mg via INTRAVENOUS
  Filled 2016-07-23 (×3): qty 2

## 2016-07-23 MED ORDER — TRAMADOL HCL 50 MG PO TABS
50.0000 mg | ORAL_TABLET | Freq: Every evening | ORAL | Status: DC
  Administered 2016-07-24 – 2016-07-29 (×6): 50 mg via ORAL
  Filled 2016-07-23 (×6): qty 1

## 2016-07-23 MED ORDER — LIDOCAINE 2% (20 MG/ML) 5 ML SYRINGE
INTRAMUSCULAR | Status: AC
  Filled 2016-07-23: qty 5

## 2016-07-23 MED ORDER — LACTATED RINGERS IV SOLN
INTRAVENOUS | Status: DC | PRN
  Administered 2016-07-23 (×2): via INTRAVENOUS

## 2016-07-23 MED ORDER — ONDANSETRON HCL 4 MG PO TABS: 4.0000 mg | ORAL_TABLET | Freq: Four times a day (QID) | ORAL | Status: DC | PRN

## 2016-07-23 MED ORDER — ONDANSETRON HCL 4 MG/2ML IJ SOLN
INTRAMUSCULAR | Status: DC | PRN
  Administered 2016-07-23 (×2): 4 mg via INTRAVENOUS

## 2016-07-23 MED ORDER — FUROSEMIDE 20 MG PO TABS
20.0000 mg | ORAL_TABLET | Freq: Every day | ORAL | Status: DC
  Administered 2016-07-24 – 2016-07-30 (×7): 20 mg via ORAL
  Filled 2016-07-23 (×7): qty 1

## 2016-07-23 MED ORDER — OXYCODONE HCL 5 MG PO TABS
5.0000 mg | ORAL_TABLET | Freq: Four times a day (QID) | ORAL | Status: DC | PRN
  Administered 2016-07-23 – 2016-07-28 (×14): 10 mg via ORAL
  Filled 2016-07-23 (×14): qty 2

## 2016-07-23 MED ORDER — ALUM & MAG HYDROXIDE-SIMETH 200-200-20 MG/5ML PO SUSP: 30.0000 mL | Freq: Four times a day (QID) | ORAL | Status: DC | PRN

## 2016-07-23 MED ORDER — PHENYLEPHRINE HCL 10 MG/ML IJ SOLN
INTRAVENOUS | Status: DC | PRN
  Administered 2016-07-23: 15 ug/min via INTRAVENOUS

## 2016-07-23 MED ORDER — TIZANIDINE HCL 4 MG PO CAPS: 4.0000 mg | ORAL_CAPSULE | Freq: Two times a day (BID) | ORAL | Status: DC

## 2016-07-23 MED ORDER — HYDROMORPHONE HCL 1 MG/ML IJ SOLN
INTRAMUSCULAR | Status: AC
  Filled 2016-07-23: qty 0.5

## 2016-07-23 MED ORDER — DEXAMETHASONE SODIUM PHOSPHATE 10 MG/ML IJ SOLN
INTRAMUSCULAR | Status: DC | PRN
  Administered 2016-07-23: 10 mg via INTRAVENOUS

## 2016-07-23 MED ORDER — PROMETHAZINE HCL 25 MG/ML IJ SOLN: 6.2500 mg | INTRAMUSCULAR | Status: DC | PRN

## 2016-07-23 MED ORDER — DEXMEDETOMIDINE HCL IN NACL 200 MCG/50ML IV SOLN
INTRAVENOUS | Status: AC
  Filled 2016-07-23: qty 50

## 2016-07-23 MED ORDER — BUPIVACAINE HCL (PF) 0.25 % IJ SOLN
INTRAMUSCULAR | Status: DC | PRN
  Administered 2016-07-23: 10 mL

## 2016-07-23 MED ORDER — SODIUM CHLORIDE 0.9 % IV SOLN: 250.0000 mL | INTRAVENOUS | Status: DC

## 2016-07-23 MED ORDER — FENTANYL CITRATE (PF) 100 MCG/2ML IJ SOLN
INTRAMUSCULAR | Status: DC | PRN
  Administered 2016-07-23 (×4): 50 ug via INTRAVENOUS

## 2016-07-23 MED ORDER — SCOPOLAMINE 1 MG/3DAYS TD PT72
MEDICATED_PATCH | TRANSDERMAL | Status: AC
  Administered 2016-07-23: 1.5 mg via TRANSDERMAL
  Filled 2016-07-23: qty 1

## 2016-07-23 MED ORDER — PROPOFOL 10 MG/ML IV BOLUS
INTRAVENOUS | Status: DC | PRN
  Administered 2016-07-23: 50 mg via INTRAVENOUS
  Administered 2016-07-23: 200 mg via INTRAVENOUS

## 2016-07-23 MED ORDER — VANCOMYCIN HCL 1000 MG IV SOLR
INTRAVENOUS | Status: DC | PRN
  Administered 2016-07-23: 1000 mg via INTRAVENOUS

## 2016-07-23 MED ORDER — TIZANIDINE HCL 4 MG PO TABS
8.0000 mg | ORAL_TABLET | Freq: Every day | ORAL | Status: DC
  Administered 2016-07-23 – 2016-07-29 (×7): 8 mg via ORAL
  Filled 2016-07-23 (×7): qty 2

## 2016-07-23 MED ORDER — ACETAMINOPHEN 10 MG/ML IV SOLN
INTRAVENOUS | Status: AC
  Filled 2016-07-23: qty 100

## 2016-07-23 MED ORDER — FENTANYL CITRATE (PF) 100 MCG/2ML IJ SOLN
INTRAMUSCULAR | Status: AC
  Filled 2016-07-23: qty 4

## 2016-07-23 MED ORDER — ONDANSETRON HCL 4 MG/2ML IJ SOLN: 4.0000 mg | Freq: Four times a day (QID) | INTRAMUSCULAR | Status: DC | PRN

## 2016-07-23 MED ORDER — SODIUM CHLORIDE 0.9% FLUSH
3.0000 mL | Freq: Two times a day (BID) | INTRAVENOUS | Status: DC
  Administered 2016-07-23 – 2016-07-26 (×5): 3 mL via INTRAVENOUS
  Administered 2016-07-27: 10 mL via INTRAVENOUS
  Administered 2016-07-27 – 2016-07-29 (×5): 3 mL via INTRAVENOUS

## 2016-07-23 MED ORDER — PHENOL 1.4 % MT LIQD: 1.0000 | OROMUCOSAL | Status: DC | PRN

## 2016-07-23 MED ORDER — VANCOMYCIN HCL 10 G IV SOLR
1250.0000 mg | Freq: Once | INTRAVENOUS | Status: AC
  Administered 2016-07-24: 1250 mg via INTRAVENOUS
  Filled 2016-07-23: qty 1250

## 2016-07-23 MED ORDER — BUPIVACAINE HCL (PF) 0.25 % IJ SOLN
INTRAMUSCULAR | Status: AC
  Filled 2016-07-23: qty 30

## 2016-07-23 MED ORDER — ACETAMINOPHEN 10 MG/ML IV SOLN
INTRAVENOUS | Status: DC | PRN
  Administered 2016-07-23: 1000 mg via INTRAVENOUS

## 2016-07-23 MED ORDER — THROMBIN 5000 UNITS EX SOLR
CUTANEOUS | Status: DC | PRN
  Administered 2016-07-23 (×2): 5000 [IU] via TOPICAL

## 2016-07-23 MED ORDER — SODIUM CHLORIDE 0.9% FLUSH: 3.0000 mL | INTRAVENOUS | Status: DC | PRN

## 2016-07-23 MED ORDER — HEMOSTATIC AGENTS (NO CHARGE) OPTIME
TOPICAL | Status: DC | PRN
  Administered 2016-07-23: 1 via TOPICAL

## 2016-07-23 MED ORDER — MELOXICAM 7.5 MG PO TABS
7.5000 mg | ORAL_TABLET | Freq: Every day | ORAL | Status: DC
  Administered 2016-07-24 – 2016-07-30 (×7): 7.5 mg via ORAL
  Filled 2016-07-23 (×7): qty 1

## 2016-07-23 MED ORDER — DEXAMETHASONE 4 MG PO TABS
4.0000 mg | ORAL_TABLET | Freq: Four times a day (QID) | ORAL | Status: DC
  Administered 2016-07-24: 4 mg via ORAL
  Filled 2016-07-23 (×2): qty 1

## 2016-07-23 MED ORDER — CYCLOBENZAPRINE HCL 10 MG PO TABS
10.0000 mg | ORAL_TABLET | Freq: Three times a day (TID) | ORAL | Status: DC | PRN
  Administered 2016-07-24 – 2016-07-28 (×5): 10 mg via ORAL
  Filled 2016-07-23 (×5): qty 1

## 2016-07-23 MED ORDER — BACITRACIN ZINC 500 UNIT/GM EX OINT
TOPICAL_OINTMENT | CUTANEOUS | Status: DC | PRN
  Administered 2016-07-23: 1 via TOPICAL

## 2016-07-23 MED ORDER — PHENYLEPHRINE 40 MCG/ML (10ML) SYRINGE FOR IV PUSH (FOR BLOOD PRESSURE SUPPORT)
PREFILLED_SYRINGE | INTRAVENOUS | Status: AC
Start: 2016-07-23 — End: 2016-07-23
  Filled 2016-07-23: qty 20

## 2016-07-23 MED ORDER — ASPIRIN-ACETAMINOPHEN-CAFFEINE 250-250-65 MG PO TABS
2.0000 | ORAL_TABLET | Freq: Four times a day (QID) | ORAL | Status: DC | PRN
  Administered 2016-07-24: 2 via ORAL
  Filled 2016-07-23 (×4): qty 2

## 2016-07-23 MED ORDER — PROPOFOL 10 MG/ML IV BOLUS
INTRAVENOUS | Status: AC
  Filled 2016-07-23: qty 20

## 2016-07-23 MED ORDER — HYDROMORPHONE HCL 1 MG/ML IJ SOLN
0.2500 mg | INTRAMUSCULAR | Status: DC | PRN
  Administered 2016-07-23: 0.5 mg via INTRAVENOUS

## 2016-07-23 MED ORDER — DEXAMETHASONE SODIUM PHOSPHATE 4 MG/ML IJ SOLN
4.0000 mg | Freq: Four times a day (QID) | INTRAMUSCULAR | Status: DC
  Administered 2016-07-23: 4 mg via INTRAVENOUS
  Filled 2016-07-23: qty 1

## 2016-07-23 MED ORDER — ALBUMIN HUMAN 5 % IV SOLN
INTRAVENOUS | Status: DC | PRN
  Administered 2016-07-23: 18:00:00 via INTRAVENOUS

## 2016-07-23 MED ORDER — DULOXETINE HCL 60 MG PO CPEP
120.0000 mg | ORAL_CAPSULE | Freq: Every day | ORAL | Status: DC
  Administered 2016-07-24 – 2016-07-30 (×7): 120 mg via ORAL
  Filled 2016-07-23 (×7): qty 2

## 2016-07-23 MED ORDER — LIDOCAINE HCL (CARDIAC) 20 MG/ML IV SOLN
INTRAVENOUS | Status: DC | PRN
  Administered 2016-07-23: 60 mg via INTRAVENOUS

## 2016-07-23 MED ORDER — EPHEDRINE 5 MG/ML INJ
INTRAVENOUS | Status: AC
  Filled 2016-07-23: qty 10

## 2016-07-23 MED ORDER — 0.9 % SODIUM CHLORIDE (POUR BTL) OPTIME
TOPICAL | Status: DC | PRN
  Administered 2016-07-23: 1000 mL

## 2016-07-23 MED ORDER — PANTOPRAZOLE SODIUM 40 MG IV SOLR: 40.0000 mg | Freq: Every day | INTRAVENOUS | Status: DC

## 2016-07-23 MED ORDER — METHADONE HCL 5 MG PO TABS
2.5000 mg | ORAL_TABLET | ORAL | Status: DC
  Administered 2016-07-24 – 2016-07-30 (×7): 2.5 mg via ORAL
  Filled 2016-07-23 (×8): qty 1

## 2016-07-23 MED ORDER — MIDAZOLAM HCL 2 MG/2ML IJ SOLN
INTRAMUSCULAR | Status: AC
  Filled 2016-07-23: qty 4

## 2016-07-23 MED ORDER — SODIUM CHLORIDE 0.9% FLUSH: 3.0000 mL | Freq: Two times a day (BID) | INTRAVENOUS | Status: DC

## 2016-07-23 MED ORDER — MENTHOL 3 MG MT LOZG: 1.0000 | LOZENGE | OROMUCOSAL | Status: DC | PRN

## 2016-07-23 MED ORDER — SIMVASTATIN 20 MG PO TABS
20.0000 mg | ORAL_TABLET | Freq: Every day | ORAL | Status: DC
  Administered 2016-07-24 – 2016-07-30 (×7): 20 mg via ORAL
  Filled 2016-07-23 (×7): qty 1

## 2016-07-23 MED ORDER — ROCURONIUM BROMIDE 50 MG/5ML IV SOSY
PREFILLED_SYRINGE | INTRAVENOUS | Status: AC
  Filled 2016-07-23: qty 15

## 2016-07-23 MED ORDER — THROMBIN 5000 UNITS EX SOLR
CUTANEOUS | Status: AC
  Filled 2016-07-23: qty 5000

## 2016-07-23 MED ORDER — THROMBIN 5000 UNITS EX SOLR
OROMUCOSAL | Status: DC | PRN
  Administered 2016-07-23: 18:00:00 via TOPICAL

## 2016-07-23 MED ORDER — SUGAMMADEX SODIUM 200 MG/2ML IV SOLN
INTRAVENOUS | Status: DC | PRN
  Administered 2016-07-23: 200 mg via INTRAVENOUS

## 2016-07-23 MED ORDER — MIDAZOLAM HCL 2 MG/2ML IJ SOLN
INTRAMUSCULAR | Status: AC
  Filled 2016-07-23: qty 2

## 2016-07-23 MED ORDER — PHENYLEPHRINE HCL 10 MG/ML IJ SOLN
INTRAMUSCULAR | Status: DC | PRN
  Administered 2016-07-23: 40 ug via INTRAVENOUS
  Administered 2016-07-23 (×3): 80 ug via INTRAVENOUS
  Administered 2016-07-23 (×5): 40 ug via INTRAVENOUS
  Administered 2016-07-23: 80 ug via INTRAVENOUS

## 2016-07-23 MED ORDER — BACITRACIN ZINC 500 UNIT/GM EX OINT
TOPICAL_OINTMENT | CUTANEOUS | Status: AC
  Filled 2016-07-23: qty 28.35

## 2016-07-23 MED ORDER — ROCURONIUM BROMIDE 100 MG/10ML IV SOLN
INTRAVENOUS | Status: DC | PRN
  Administered 2016-07-23: 10 mg via INTRAVENOUS
  Administered 2016-07-23: 50 mg via INTRAVENOUS

## 2016-07-23 MED ORDER — ACETAMINOPHEN 325 MG PO TABS: 650.0000 mg | ORAL_TABLET | ORAL | Status: DC | PRN

## 2016-07-23 MED ORDER — KETOROLAC TROMETHAMINE 30 MG/ML IJ SOLN
INTRAMUSCULAR | Status: DC | PRN
  Administered 2016-07-23: 30 mg via INTRAVENOUS

## 2016-07-23 SURGICAL SUPPLY — 60 items
ADH SKN CLS APL DERMABOND .7 (GAUZE/BANDAGES/DRESSINGS) ×1
APL SKNCLS STERI-STRIP NONHPOA (GAUZE/BANDAGES/DRESSINGS) ×1
BAG DECANTER FOR FLEXI CONT (MISCELLANEOUS) ×2 IMPLANT
BENZOIN TINCTURE PRP APPL 2/3 (GAUZE/BANDAGES/DRESSINGS) ×2 IMPLANT
BLADE CLIPPER SURG (BLADE) IMPLANT
BLADE SURG 11 STRL SS (BLADE) ×2 IMPLANT
BRUSH SCRUB EZ PLAIN DRY (MISCELLANEOUS) ×1 IMPLANT
BUR MATCHSTICK NEURO 3.0 LAGG (BURR) ×2 IMPLANT
BUR PRECISION FLUTE 6.0 (BURR) ×2 IMPLANT
CANISTER SUCT 3000ML PPV (MISCELLANEOUS) ×2 IMPLANT
CARTRIDGE OIL MAESTRO DRILL (MISCELLANEOUS) ×1 IMPLANT
DECANTER SPIKE VIAL GLASS SM (MISCELLANEOUS) ×2 IMPLANT
DERMABOND ADVANCED (GAUZE/BANDAGES/DRESSINGS) ×1
DERMABOND ADVANCED .7 DNX12 (GAUZE/BANDAGES/DRESSINGS) ×1 IMPLANT
DIFFUSER DRILL AIR PNEUMATIC (MISCELLANEOUS) ×2 IMPLANT
DRAPE HALF SHEET 40X57 (DRAPES) IMPLANT
DRAPE LAPAROTOMY 100X72X124 (DRAPES) ×2 IMPLANT
DRAPE MICROSCOPE LEICA (MISCELLANEOUS) ×2 IMPLANT
DRAPE POUCH INSTRU U-SHP 10X18 (DRAPES) ×2 IMPLANT
DRAPE SURG 17X23 STRL (DRAPES) ×2 IMPLANT
DRSG OPSITE POSTOP 4X8 (GAUZE/BANDAGES/DRESSINGS) ×1 IMPLANT
DURAPREP 26ML APPLICATOR (WOUND CARE) ×2 IMPLANT
ELECT REM PT RETURN 9FT ADLT (ELECTROSURGICAL) ×2
ELECTRODE REM PT RTRN 9FT ADLT (ELECTROSURGICAL) ×1 IMPLANT
GAUZE SPONGE 4X4 12PLY STRL (GAUZE/BANDAGES/DRESSINGS) ×2 IMPLANT
GAUZE SPONGE 4X4 16PLY XRAY LF (GAUZE/BANDAGES/DRESSINGS) ×1 IMPLANT
GLOVE BIO SURGEON STRL SZ8 (GLOVE) ×2 IMPLANT
GLOVE BIOGEL PI IND STRL 7.5 (GLOVE) IMPLANT
GLOVE BIOGEL PI INDICATOR 7.5 (GLOVE) ×1
GLOVE EXAM NITRILE LRG STRL (GLOVE) IMPLANT
GLOVE EXAM NITRILE XL STR (GLOVE) IMPLANT
GLOVE EXAM NITRILE XS STR PU (GLOVE) IMPLANT
GLOVE INDICATOR 8.5 STRL (GLOVE) ×2 IMPLANT
GOWN STRL REUS W/ TWL LRG LVL3 (GOWN DISPOSABLE) ×1 IMPLANT
GOWN STRL REUS W/ TWL XL LVL3 (GOWN DISPOSABLE) ×2 IMPLANT
GOWN STRL REUS W/TWL 2XL LVL3 (GOWN DISPOSABLE) IMPLANT
GOWN STRL REUS W/TWL LRG LVL3 (GOWN DISPOSABLE) ×2
GOWN STRL REUS W/TWL XL LVL3 (GOWN DISPOSABLE) ×4
HEMOSTAT POWDER KIT SURGIFOAM (HEMOSTASIS) ×1 IMPLANT
KIT BASIN OR (CUSTOM PROCEDURE TRAY) ×2 IMPLANT
KIT ROOM TURNOVER OR (KITS) ×2 IMPLANT
NDL SPNL 22GX3.5 QUINCKE BK (NEEDLE) ×1 IMPLANT
NEEDLE HYPO 22GX1.5 SAFETY (NEEDLE) ×2 IMPLANT
NEEDLE SPNL 22GX3.5 QUINCKE BK (NEEDLE) ×2 IMPLANT
NS IRRIG 1000ML POUR BTL (IV SOLUTION) ×2 IMPLANT
OIL CARTRIDGE MAESTRO DRILL (MISCELLANEOUS) ×2
PACK LAMINECTOMY NEURO (CUSTOM PROCEDURE TRAY) ×2 IMPLANT
RUBBERBAND STERILE (MISCELLANEOUS) ×4 IMPLANT
SEALANT ADHERUS EXTEND TIP (MISCELLANEOUS) ×1 IMPLANT
SPONGE SURGIFOAM ABS GEL SZ50 (HEMOSTASIS) ×2 IMPLANT
STRIP CLOSURE SKIN 1/2X4 (GAUZE/BANDAGES/DRESSINGS) ×2 IMPLANT
SUT ETHILON 3 0 FSL (SUTURE) ×1 IMPLANT
SUT PROLENE 6 0 BV (SUTURE) ×2 IMPLANT
SUT VIC AB 0 CT1 18XCR BRD8 (SUTURE) ×1 IMPLANT
SUT VIC AB 0 CT1 8-18 (SUTURE) ×2
SUT VIC AB 2-0 CT1 18 (SUTURE) ×3 IMPLANT
SUT VICRYL 4-0 PS2 18IN ABS (SUTURE) ×2 IMPLANT
TOWEL GREEN STERILE (TOWEL DISPOSABLE) ×2 IMPLANT
TOWEL GREEN STERILE FF (TOWEL DISPOSABLE) ×2 IMPLANT
WATER STERILE IRR 1000ML POUR (IV SOLUTION) ×2 IMPLANT

## 2016-07-23 NOTE — Anesthesia Postprocedure Evaluation (Addendum)
Anesthesia Post Note  Patient: Katrina Fernandez  Procedure(s) Performed: Procedure(s) (LRB): Decompressive Lumbar Laminectomy Lumbar two-three  for Repair of  Cerberal Spinal Fluid  Leak (N/A)  Patient location during evaluation: PACU Anesthesia Type: General Level of consciousness: sedated Pain management: pain level controlled Vital Signs Assessment: post-procedure vital signs reviewed and stable Respiratory status: spontaneous breathing and respiratory function stable Cardiovascular status: stable Anesthetic complications: no       Last Vitals:  Vitals:   07/23/16 1915 07/23/16 1930  BP: 103/65 110/80  Pulse: 95 88  Resp: 17 14  Temp:      Last Pain:  Vitals:   07/23/16 1001  TempSrc: Oral                 Liadan Guizar DANIEL

## 2016-07-23 NOTE — Anesthesia Preprocedure Evaluation (Signed)
Anesthesia Evaluation  Patient identified by MRN, date of birth, ID band Patient awake    Reviewed: Allergy & Precautions, NPO status , Patient's Chart, lab work & pertinent test results  History of Anesthesia Complications (+) PONV  Airway Mallampati: II  TM Distance: >3 FB Neck ROM: Full    Dental no notable dental hx.    Pulmonary neg pulmonary ROS,    Pulmonary exam normal breath sounds clear to auscultation       Cardiovascular negative cardio ROS Normal cardiovascular exam Rhythm:Regular Rate:Normal     Neuro/Psych negative neurological ROS  negative psych ROS   GI/Hepatic negative GI ROS, Neg liver ROS,   Endo/Other  Morbid obesity  Renal/GU negative Renal ROS  negative genitourinary   Musculoskeletal  (+) Fibromyalgia -, narcotic dependent  Abdominal   Peds negative pediatric ROS (+)  Hematology negative hematology ROS (+)   Anesthesia Other Findings   Reproductive/Obstetrics negative OB ROS                             Anesthesia Physical Anesthesia Plan  ASA: III  Anesthesia Plan: General   Post-op Pain Management:    Induction: Intravenous  Airway Management Planned: Oral ETT  Additional Equipment:   Intra-op Plan:   Post-operative Plan: Extubation in OR  Informed Consent: I have reviewed the patients History and Physical, chart, labs and discussed the procedure including the risks, benefits and alternatives for the proposed anesthesia with the patient or authorized representative who has indicated his/her understanding and acceptance.   Dental advisory given  Plan Discussed with: CRNA and Surgeon  Anesthesia Plan Comments:         Anesthesia Quick Evaluation

## 2016-07-23 NOTE — Op Note (Signed)
In: Preoperative diagnosis: CSF leak  Postoperative diagnosis: Same  Procedure: Decompressive lumbar laminotomy L2-3 for exposure and closure of CSF leak from shunt tract are g   surgeon: [name] Jerrico Covello  Anesthesia: Gen.  EBL: Minimal  History of present illness: Patient is a 51 year old female who 2 weeks ago underwent removal of an LP shunt postoperatively patient did very well recovered in the floor was observed in the hospital for a few days and discharged home. While home patient I'll increased swelling around the lumbar incision is well swelling around the generator for spinal stability. This was felt to be consistent with CSF spinal fluid is admitted the patient and to the OR and recommended exploration decompressive laminectomy and primary closure of shunt tract: The dura. I extenively went over the risks and benefits of hat operation with her as we as peopie course expectation outcome and altenatives of surgery and she undersood and proceed forward.  Operative procedure: Patient brought into the or was induced under general anesthesia positioned prone on the on the Wilson frame her back was prepped and draped in routine sterile fashion preoperative x-ray lab me to center over the L2-3 interspace so after infiltration of 10 mL lidocaine with epi a midline incision was made and Bovie left car was used to take down the subcutaneous tissue and subcutaneous periosteal dissections care lamina of L2-3 bilaterally. Interoperative x-ray confirmed with navigation proper level so spinous process at L2 was removed central decompression was begun I performed a decompressive laminectomy until I identified the source of the CSF leak. At the site of the shunt tract there was a fistulous component coming out of the dura and extending approximately 1 cm with pouring CSF. There was a extensive amount of CSF in the wound during opening as well. I certify [name] 8 6-0 Prolene which sealed up the defect. Subsequent to  that I did take a piece of muscle and sewed a patch over it. I Valsalva there was no CSF egressed during the Valsalva I then placed fibrin glue Gelfoam and fibrin glue again over the defect. Contiguous hemostasis was maintained a use Surgifoam and then closed the wound primarily with Vicryl and a running nylon in the skin. At the end of case all needle counts sponge counts were correct. Patient recovered in stable condition.

## 2016-07-23 NOTE — Progress Notes (Signed)
ANTIBIOTIC CONSULT NOTE - INITIAL  Pharmacy Consult for Vancomycin x 1 Indication: surgical prophylaxis  Allergies  Allergen Reactions  . Penicillins Anaphylaxis    Has patient had a PCN reaction causing immediate rash, facial/tongue/throat swelling, SOB or lightheadedness with hypotension: Yes Has patient had a PCN reaction causing severe rash involving mucus membranes or skin necrosis: No Has patient had a PCN reaction that required hospitalization No Has patient had a PCN reaction occurring within the last 10 years: No If all of the above answers are "NO", then may proceed with Cephalosporin use.   . Chlorhexidine Other (See Comments)    Dermatitis - Allergic  . Lactose Swelling and Rash  . Other Swelling and Rash    Silicone Tape  . Tape Rash    Plastic tape    Patient Measurements: Height: 5\' 3"  (160 cm) Weight: 224 lb 6.8 oz (101.8 kg) IBW/kg (Calculated) : 52.4  07/10/16 Labs:  creatinine 0.88  Microbiology: Recent Results (from the past 720 hour(s))  MRSA PCR Screening     Status: None   Collection Time: 07/10/16  1:07 AM  Result Value Ref Range Status   MRSA by PCR NEGATIVE NEGATIVE Final    Comment:        The GeneXpert MRSA Assay (FDA approved for NASAL specimens only), is one component of a comprehensive MRSA colonization surveillance program. It is not intended to diagnose MRSA infection nor to guide or monitor treatment for MRSA infections.   Cath Tip Culture     Status: None   Collection Time: 07/10/16  2:06 PM  Result Value Ref Range Status   Specimen Description CATH TIP  Final   Special Requests NONE  Final   Culture NO GROWTH 3 DAYS  Final   Report Status 07/14/2016 FINAL  Final    Medical History: Past Medical History:  Diagnosis Date  . Anemia   . Arthritis    "back and knees" (07/23/2016)  . Chiari malformation type I ([place]) dx'd 2014  . Chronic back pain    buldging, DDD; "neck and lower back" (07/23/2016)  . Chronic constipation    takes Miralax daily  . Chronic pain syndrome    takes Methadone and Keppra daily  . Depression    takes Effexor daily  . Fibromyalgia   . GERD (gastroesophageal reflux disease)    not taking anything at the present time  . History of DVT of lower extremity 07/2012   LEFT LEG --  3/ 2014.Was on a blood transfusion for 6 months  . History of kidney stones   . History of MRSA infection 10 yrs ago  . Hyperlipidemia    takes Simvastatin daily  . Migraine    "daily recently" (07/23/2016)  . Muscle spasm    takes Zanaflex nightly  . Peripheral edema    takes Lasix daily as needed  . Peripheral neuropathy ([place])   . Pneumonia    hx of-7-8 yrs ago  . PONV (postoperative nausea and vomiting)   . Seasonal allergies    takes Claritin daily  . Spinal headache    "since 2002; still get them; try to manage it w/lying flat"  . Weakness    and numbness in right hand    Assessment:   51 yr old female s/p lumbar surgery. No drain.   PCN allergy.  Vancomycin 1gm IV given pre-op at ~5pm.   Estimated creatinine clearance ~85-90 ml/min.  Goal of Therapy:  Vancomycin trough level 10-15 mcg/ml  Plan:  Vancomycin 1250 mg IV x 1 ~5am on 07/24/16.  No follow up needed.  Pharmacy to sign off.  Arty Baumgartner, [place] Pager: [phone] 07/23/2016,9:46 PM

## 2016-07-23 NOTE — H&P (Signed)
Katrina Fernandez is an 51 y.o. female.   Chief Complaint: Headaches back pain swelling around the incisions HPI: 51 year old female previously undergone lumbar peritoneal shunt removal for acquired Chiari malformation. Patient was discharged last week as had persistent headaches with no improvement also experiencing some photophobia and pain behind her eyes. We admitted the patient will workup with a head CT and plan a for primary repair of shunt exit site  Past Medical History:  Diagnosis Date  . Arthritis    back and knees  . Chronic back pain    buldging,DDD  . Chronic constipation    takes Miralax daily  . Chronic pain syndrome    takes Methadone and Keppra daily  . Depression    takes Effexor daily  . GERD (gastroesophageal reflux disease)    not taking anything at the present time  . History of bronchitis 10+ yrs ago  . History of colon polyps    benign  . History of DVT of lower extremity    LEFT LEG --  3/ 2014.Was on a blood transfusion for 6 months  . History of kidney stones   . History of migraine    last one a month ago  . History of MRSA infection 10 yrs ago  . Hyperlipidemia    takes Simvastatin daily  . Muscle spasm    takes Zanaflex nightly  . Peripheral edema    takes Lasix daily as needed  . Peripheral neuropathy ([place])   . Pneumonia    hx of-7-8 yrs ago  . PONV (postoperative nausea and vomiting)   . Seasonal allergies    takes Claritin daily  . Trigger finger    right  . Weakness    and numbness in right hand    Past Surgical History:  Procedure Laterality Date  . ANTERIOR CERVICAL DECOMP/DISCECTOMY FUSION  07-25-2009   C5 -- C6  . APPENDECTOMY  age 61  . BREAST REDUCTION SURGERY Bilateral 06/07/2013   Procedure: BILATERAL MAMMARY REDUCTION  (BREAST);  Surgeon: Theodoro Kos, DO;  Location: [facility];  Service: Plastics;  Laterality: Bilateral;  . BREAST REDUCTION SURGERY Bilateral 06/07/2013   Procedure:  LIPOSUCTION;  Surgeon:  Theodoro Kos, DO;  Location: [place];  Service: Plastics;  Laterality: Bilateral;  . CARPAL TUNNEL RELEASE Bilateral 2001  . CHOLECYSTECTOMY    . COLONOSCOPY    . ESOPHAGOGASTRODUODENOSCOPY    . gastric banding undone  11/2014  . LAPAROSCOPIC GASTRIC BANDING  2011  . PLACEMENT LUMBOPERITONEAL SHUNT FOR CEREBROSPINAL FLUID DIVERSION AWAY FROM C8 PERINEURAL CYST  09-05-2003  &  12-24-2003   08-31-2003  REMOVAL SHUNT  . POSTERIOR CERVICAL LAMINECTOMY  03-01-2001   C7-8 and Exploration C8 perineural cyst  . POSTERIOR LUMBAR LAMINECTOMY L4-5/  DISKECTOMY L5-S1  05-20-2005  . RE-DO DECOMPRESSION LAMINECTOMY AND FUSION L4-5  &  L5-S1  12-20-2006   02-04-2007--  RE-EXPLORATION L4 to S1, I & D LUMBAR WOUND HEMATOMA  . SHUNT REMOVAL N/A 07/10/2016   Procedure: SHUNT REMOVAL;  Surgeon: Kary Kos, MD;  Location: [facility];  Service: Neurosurgery;  Laterality: N/A;  SHUNT REMOVAL  . SPINAL CORD STIMULATOR INSERTION N/A 04/26/2015   Procedure: LUMBAR SPINAL CORD STIMULATOR INSERTION;  Surgeon: Clydell Hakim, MD;  Location: [facility];  Service: Neurosurgery;  Laterality: N/A;  LUMBAR SPINAL CORD STIMULATOR INSERTION  . TENNIS ELBOW RELEASE/NIRSCHEL PROCEDURE Left 12/13/2014   Procedure: LEFT ELBOW LATERAL EPICONDYLAR DEBRIDEMENT AND OR REPAIR -RECONSTRUCTION ;  Surgeon: Iran Planas,  MD;  Location: [place];  Service: Orthopedics;  Laterality: Left;  . TOTAL ABDOMINAL HYSTERECTOMY  1994   w/  Bilateral Salpinoophorectomy    Family History  Problem Relation Age of Onset  . Depression    . Colon cancer Father   . Ovarian cancer Maternal Grandmother   . Pancreatic cancer Maternal Grandfather    Social History:  reports that she has never smoked. She has never used smokeless tobacco. She reports that she does not drink alcohol or use drugs.  Allergies:  Allergies  Allergen Reactions  . Penicillins Anaphylaxis  . Chlorhexidine Other (See Comments)    Dermatitis -  Allergic  . Lactose Swelling and Rash  . Other Swelling and Rash    Silicone Tape  . Tape Rash    Plastic tape    Medications Prior to Admission  Medication Sig Dispense Refill  . aspirin-acetaminophen-caffeine (EXCEDRIN MIGRAINE) 250-250-65 MG tablet Take 2 tablets by mouth every 6 (six) hours as needed for headache or migraine.    . cyclobenzaprine (FLEXERIL) 10 MG tablet Take 1 tablet (10 mg total) by mouth 3 (three) times daily as needed for muscle spasms. 30 tablet 0  . DULoxetine (CYMBALTA) 60 MG capsule Take 2 capsules (120 mg total) by mouth daily. 180 capsule 3  . furosemide (LASIX) 20 MG tablet TAKE 2 TABLETS BY MOUTH EVERY MORNING AND 1 TABLET EVERY AFTEROON, ONLY AS NEEDED FOR LEG SWELLING 90 tablet 0  . levETIRAcetam (KEPPRA) 500 MG tablet Take 500 mg by mouth 2 (two) times daily.     . meloxicam (MOBIC) 7.5 MG tablet Take 7.5 mg by mouth daily.    . methadone (DOLOPHINE) 5 MG tablet Take 2.5 mg by mouth every morning.   0  . methylPREDNISolone (MEDROL DOSEPAK) 4 MG TBPK tablet Take as directed 21 tablet 0  . montelukast (SINGULAIR) 10 MG tablet TAKE 1 TABLET (10 MG TOTAL) BY MOUTH AT BEDTIME. (Patient taking differently: Take 10 mg by mouth every morning. ) 30 tablet 11  . oxyCODONE (OXY IR/ROXICODONE) 5 MG immediate release tablet Take 1-2 tablets (5-10 mg total) by mouth every 6 (six) hours as needed for breakthrough pain. 30 tablet 0  . pramipexole (MIRAPEX) 0.125 MG tablet Take 3 tablets (0.375 mg total) by mouth at bedtime. (2-3 hours prior to bedtime) 180 tablet 1  . simvastatin (ZOCOR) 20 MG tablet Take 1 tablet (20 mg total) by mouth daily. NEED FOLLOW UP VISIT FOR MORE REFILLS 30 tablet 0  . tiZANidine (ZANAFLEX) 4 MG capsule Take 4-8 mg by mouth 2 (two) times daily. ONE TAB AM - PRN AND 2 TABS AT BEDTIME    . traMADol (ULTRAM) 50 MG tablet Take 50 mg by mouth every evening.      No results found for this or any previous visit (from the past 48 hour(s)). No results  found.  Review of Systems  Eyes: Positive for blurred vision and photophobia.  Neurological: Positive for dizziness and headaches.    There were no vitals taken for this visit. Physical Exam  Constitutional: She is oriented to person, place, and time. She appears well-developed and well-nourished.  Eyes: Pupils are equal, round, and reactive to light.  GI: Soft.  Neurological: She is alert and oriented to person, place, and time. She has normal strength. GCS eye subscore is 4. GCS verbal subscore is 5. GCS motor subscore is 6.  Patient is awake and alert strength out of 5 she's neurologically intact incisions around  both the back incision and her pump incision have a boggy fluid filled  Skin: Skin is warm and dry.     Assessment/Plan 51 year female presents for decompressive laminectomy L2-3. Primary repair of CSF leak due to persistent hole from shunt exit site.  Ja Pistole P, MD 07/23/2016, 9:37 AM

## 2016-07-23 NOTE — Transfer of Care (Signed)
Immediate Anesthesia Transfer of Care Note  Patient: Katrina Fernandez  Procedure(s) Performed: Procedure(s): Decompressive Lumbar Laminectomy Lumbar two-three  for Repair of  Cerberal Spinal Fluid  Leak (N/A)  Patient Location: PACU  Anesthesia Type:General  Level of Consciousness: awake, alert , oriented and patient cooperative  Airway & Oxygen Therapy: Patient Spontanous Breathing and Patient connected to nasal cannula oxygen  Post-op Assessment: Report given to RN, Post -op Vital signs reviewed and stable and Patient moving all extremities X 4  Post vital signs: Reviewed and stable  Last Vitals:  Vitals:   07/23/16 1001  BP: (!) 137/91  Pulse: (!) 119  Resp: 20  Temp: 36.6 C    Last Pain:  Vitals:   07/23/16 1001  TempSrc: Oral         Complications: No apparent anesthesia complications

## 2016-07-23 NOTE — Anesthesia Procedure Notes (Signed)
Procedure Name: Intubation Date/Time: 07/23/2016 5:12 PM Performed by: Merdis Delay Pre-anesthesia Checklist: Patient identified, Emergency Drugs available, Suction available, Patient being monitored and Timeout performed Patient Re-evaluated:Patient Re-evaluated prior to inductionOxygen Delivery Method: Circle system utilized Preoxygenation: Pre-oxygenation with 100% oxygen Intubation Type: IV induction Ventilation: Mask ventilation without difficulty Laryngoscope Size: Mac and 3 Grade View: Grade I Tube size: 7.5 mm Number of attempts: 1 Airway Equipment and Method: Stylet Placement Confirmation: ETT inserted through vocal cords under direct vision,  positive ETCO2 and breath sounds checked- equal and bilateral Secured at: 22 cm Tube secured with: Tape Dental Injury: Teeth and Oropharynx as per pre-operative assessment

## 2016-07-24 ENCOUNTER — Encounter (HOSPITAL_COMMUNITY): Payer: Self-pay | Admitting: Neurosurgery

## 2016-07-24 MED ORDER — PROMETHAZINE HCL 25 MG/ML IJ SOLN
12.5000 mg | Freq: Four times a day (QID) | INTRAMUSCULAR | Status: DC | PRN
  Administered 2016-07-24: 12.5 mg via INTRAVENOUS
  Filled 2016-07-24: qty 1

## 2016-07-24 MED ORDER — DEXAMETHASONE 4 MG PO TABS
4.0000 mg | ORAL_TABLET | Freq: Four times a day (QID) | ORAL | Status: DC
  Administered 2016-07-25 – 2016-07-28 (×12): 4 mg via ORAL
  Filled 2016-07-24 (×12): qty 1

## 2016-07-24 MED ORDER — DEXAMETHASONE SODIUM PHOSPHATE 4 MG/ML IJ SOLN
4.0000 mg | Freq: Four times a day (QID) | INTRAMUSCULAR | Status: DC
  Administered 2016-07-24 – 2016-07-25 (×4): 4 mg via INTRAVENOUS
  Filled 2016-07-24 (×4): qty 1

## 2016-07-24 NOTE — Progress Notes (Signed)
Patient ID: Katrina Fernandez, female   DOB: 12/30/1965, 51 y.o.   MRN: [id number] Patient feels better improvement of her headache although still photophobia and retro-orbital pain.  Afebrile vital signs are stable incision clean dry and intact  Continue flat bedrest for another 48 hours and then slowly mobilize. We'll start IV Decadron for headaches.

## 2016-07-25 MED ORDER — PANTOPRAZOLE SODIUM 40 MG PO TBEC
40.0000 mg | DELAYED_RELEASE_TABLET | Freq: Every day | ORAL | Status: DC
  Administered 2016-07-25 – 2016-07-29 (×5): 40 mg via ORAL
  Filled 2016-07-25 (×5): qty 1

## 2016-07-25 MED ORDER — DOCUSATE SODIUM 100 MG PO CAPS
100.0000 mg | ORAL_CAPSULE | Freq: Two times a day (BID) | ORAL | Status: DC
  Administered 2016-07-25 – 2016-07-30 (×11): 100 mg via ORAL
  Filled 2016-07-25 (×11): qty 1

## 2016-07-25 MED ORDER — POLYETHYLENE GLYCOL 3350 17 G PO PACK
17.0000 g | PACK | Freq: Every day | ORAL | Status: DC
  Administered 2016-07-25 – 2016-07-30 (×6): 17 g via ORAL
  Filled 2016-07-25 (×6): qty 1

## 2016-07-25 NOTE — Progress Notes (Signed)
Vitals:   07/24/16 1750 07/24/16 2123 07/25/16 0122 07/25/16 0511  BP: (!) 99/53 (!) 118/58 96/60 99/68   Pulse: 81 84 85 79  Resp: 16 18 18 18   Temp: 98 F (36.7 C) 98.6 F (37 C) 98 F (36.7 C) 97.9 F (36.6 C)  TempSrc: Oral Oral Oral Oral  SpO2: 97% 96% 98% 96%  Weight:      Height:        Patient continues to complain of ocular pain, that has been a symptom of her CSF hypotension. Continues on strict bedrest with head of bed flat.  Plan: To be reassessed tomorrow, to decide whether to begin to mobilize.  Hosie Spangle, MD 07/25/2016, 8:06 AM

## 2016-07-26 NOTE — Progress Notes (Signed)
Patient ID: Katrina Fernandez, female   DOB: [birth year], 51 y.o.   MRN: [id number] Subjective: Patient reports some posterior headache  Objective: Vital signs in last 24 hours: Temp:  [97.7 F (36.5 C)-98.1 F (36.7 C)] 97.7 F (36.5 C) (03/11 0518) Pulse Rate:  [68-83] 68 (03/11 0518) Resp:  [17-20] 20 (03/11 0518) BP: (103-132)/(59-82) 123/82 (03/11 0518) SpO2:  [98 %-99 %] 99 % (03/11 0518)  Intake/Output from previous day: 03/10 0701 - 03/11 0700 In: 120 [P.O.:120] Out: 600 [Urine:600] Intake/Output this shift: No intake/output data recorded.  Neurologic: Grossly normal  Lab Results: Lab Results  Component Value Date   WBC 12.7 (H) 07/10/2016   HGB 13.8 07/10/2016   HCT 43.1 07/10/2016   MCV 90.2 07/10/2016   PLT 265 07/10/2016   Lab Results  Component Value Date   INR 1.05 07/10/2016   BMET Lab Results  Component Value Date   NA 141 07/10/2016   K 4.3 07/10/2016   CL 106 07/10/2016   CO2 25 07/10/2016   GLUCOSE 167 (H) 07/10/2016   BUN 8 07/10/2016   CREATININE 0.88 07/10/2016   CALCIUM 9.1 07/10/2016    Studies/Results: No results found.  Assessment/Plan: Doing ok, some posterior headache, sit up 15 degrees today   LOS: 3 days    Macaulay Reicher S 07/26/2016, 9:56 AM

## 2016-07-27 LAB — GLUCOSE, CAPILLARY: Glucose-Capillary: 230 mg/dL — ABNORMAL HIGH (ref 65–99)

## 2016-07-27 NOTE — Progress Notes (Signed)
Subjective: Patient reports doing well. Headaches are much better and more tolerable than they were. Denies any fluid leaking from site. States that she sat up to 20 degrees yesterday and tolerated it well. Still reports some blurry vision at times.  Objective: Vital signs in last 24 hours: Temp:  [97.7 F (36.5 C)-98.4 F (36.9 C)] 98 F (36.7 C) (03/12 0457) Pulse Rate:  [56-80] 65 (03/12 0457) Resp:  [16-20] 20 (03/12 0457) BP: (128-151)/(77-91) 139/85 (03/12 0457) SpO2:  [97 %-98 %] 98 % (03/12 0457)  Intake/Output from previous day: 03/11 0701 - 03/12 0700 In: 50 [P.O.:50] Out: 1850 [Urine:1850] Intake/Output this shift: No intake/output data recorded.  Neurologic: Grossly normal  Lab Results: Lab Results  Component Value Date   WBC 12.7 (H) 07/10/2016   HGB 13.8 07/10/2016   HCT 43.1 07/10/2016   MCV 90.2 07/10/2016   PLT 265 07/10/2016   Lab Results  Component Value Date   INR 1.05 07/10/2016   BMET Lab Results  Component Value Date   NA 141 07/10/2016   K 4.3 07/10/2016   CL 106 07/10/2016   CO2 25 07/10/2016   GLUCOSE 167 (H) 07/10/2016   BUN 8 07/10/2016   CREATININE 0.88 07/10/2016   CALCIUM 9.1 07/10/2016    Studies/Results: No results found.  Assessment/Plan: Doing well. Will continue to sit up slowly between 20-30 degrees today to see how well she tolerates it.    LOS: 4 days    Ocie Cornfield Meyran 07/27/2016, 8:16 AM

## 2016-07-27 NOTE — Care Management Important Message (Signed)
Important Message  Patient Details  Name: Katrina Fernandez MRN: [id number] Date of Birth: 10-04-1965   Medicare Important Message Given:  Yes    Icel Castles Montine Circle 07/27/2016, 12:16 PM

## 2016-07-27 NOTE — Care Management Note (Signed)
Case Management Note  Patient Details  Name: Katrina Fernandez MRN: [id number] Date of Birth: 06-01-1965  Subjective/Objective:    Pt underwent:  Decompressive Lumbar Laminectomy Lumbar two-three  for Repair of  Cerberal Spinal Fluid  Leak. Pt is from home with spouse.               Action/Plan: Plan is to return home when medically ready. CM following for d/c needs, physician orders.  Expected Discharge Date:                  Expected Discharge Plan:  Home/Self Care  In-House Referral:     Discharge planning Services     Post Acute Care Choice:    Choice offered to:     DME Arranged:    DME Agency:     HH Arranged:    HH Agency:     Status of Service:  In process, will continue to follow  If discussed at Long Length of Stay Meetings, dates discussed:    Additional Comments:  Pollie Friar, RN 07/27/2016, 11:32 AM

## 2016-07-28 LAB — URINALYSIS, ROUTINE W REFLEX MICROSCOPIC
Bilirubin Urine: NEGATIVE
Glucose, UA: NEGATIVE mg/dL
Ketones, ur: NEGATIVE mg/dL
Nitrite: NEGATIVE
Protein, ur: 30 mg/dL — AB
Specific Gravity, Urine: 1.012 (ref 1.005–1.030)
pH: 8 (ref 5.0–8.0)

## 2016-07-28 MED ORDER — BUTALBITAL-APAP-CAFFEINE 50-325-40 MG PO TABS
2.0000 | ORAL_TABLET | Freq: Four times a day (QID) | ORAL | Status: DC | PRN
  Administered 2016-07-28 (×2): 2 via ORAL
  Filled 2016-07-28 (×2): qty 2

## 2016-07-28 MED ORDER — OXYCODONE HCL 5 MG PO TABS
5.0000 mg | ORAL_TABLET | Freq: Four times a day (QID) | ORAL | Status: DC | PRN
  Administered 2016-07-28: 10 mg via ORAL
  Administered 2016-07-29 (×2): 5 mg via ORAL
  Administered 2016-07-29: 10 mg via ORAL
  Administered 2016-07-30: 5 mg via ORAL
  Filled 2016-07-28: qty 2
  Filled 2016-07-28 (×3): qty 1
  Filled 2016-07-28: qty 2

## 2016-07-28 MED ORDER — DEXAMETHASONE SODIUM PHOSPHATE 4 MG/ML IJ SOLN: 2.0000 mg | Freq: Three times a day (TID) | INTRAMUSCULAR | Status: DC

## 2016-07-28 MED ORDER — DEXAMETHASONE 2 MG PO TABS
2.0000 mg | ORAL_TABLET | Freq: Three times a day (TID) | ORAL | Status: DC
  Administered 2016-07-28 – 2016-07-29 (×3): 2 mg via ORAL
  Filled 2016-07-28 (×3): qty 1

## 2016-07-28 NOTE — Progress Notes (Deleted)
Patient is discharged from room 5C09 at this time. Alert and in stable condition. IV site d/c'd and instruction read to patient with understanding verbalized. Left unit via wheelchair with all belongings at side.

## 2016-07-28 NOTE — Progress Notes (Signed)
Patient complained of headaches not relieved by current medication prescribed. Also, patient's urine noted to be cloudy with sediments and patient requested to remove foley cath. Dr Vertell Limber notified and gave verbal orders which are carried out. Urine sample sent to lab for urinalysis and culture at this time.

## 2016-07-28 NOTE — Progress Notes (Addendum)
Subjective: Patient reports headache got worse yesterday afternoon after she took a nap and got behind on her pain medication. Denies any nausea, vomiting, or blurry vision. Sat up to 30 degree yesterday and tolerated it well.   Objective: Vital signs in last 24 hours: Temp:  [98.2 F (36.8 C)-98.6 F (37 C)] 98.3 F (36.8 C) (03/13 0458) Pulse Rate:  [76-89] 76 (03/13 0458) Resp:  [20] 20 (03/13 0458) BP: (127-155)/(77-93) 152/93 (03/13 0458) SpO2:  [96 %-98 %] 98 % (03/13 0458)  Intake/Output from previous day: 03/12 0701 - 03/13 0700 In: 240 [P.O.:240] Out: 2800 [Urine:2800] Intake/Output this shift: No intake/output data recorded.  Neurologic: Grossly normal  Lab Results: Lab Results  Component Value Date   WBC 12.7 (H) 07/10/2016   HGB 13.8 07/10/2016   HCT 43.1 07/10/2016   MCV 90.2 07/10/2016   PLT 265 07/10/2016   Lab Results  Component Value Date   INR 1.05 07/10/2016   BMET Lab Results  Component Value Date   NA 141 07/10/2016   K 4.3 07/10/2016   CL 106 07/10/2016   CO2 25 07/10/2016   GLUCOSE 167 (H) 07/10/2016   BUN 8 07/10/2016   CREATININE 0.88 07/10/2016   CALCIUM 9.1 07/10/2016    Studies/Results: No results found.  Assessment/Plan: Will try to mobilize out of bed today as tolerated. Start on Fioricet for pain management.    LOS: 5 days    Ocie Cornfield [facility] 07/28/2016, 8:19 AM  Mobilize today, try fioricet Dr Ronnald Ramp

## 2016-07-29 ENCOUNTER — Inpatient Hospital Stay (HOSPITAL_COMMUNITY): Payer: Medicare Other

## 2016-07-29 MED ORDER — MAGIC MOUTHWASH
5.0000 mL | Freq: Three times a day (TID) | ORAL | Status: DC | PRN
  Administered 2016-07-29: 5 mL via ORAL
  Filled 2016-07-29: qty 5

## 2016-07-29 MED ORDER — DEXAMETHASONE SODIUM PHOSPHATE 4 MG/ML IJ SOLN: 1.0000 mg | Freq: Three times a day (TID) | INTRAMUSCULAR | Status: DC

## 2016-07-29 MED ORDER — DEXAMETHASONE 2 MG PO TABS
1.0000 mg | ORAL_TABLET | Freq: Three times a day (TID) | ORAL | Status: DC
  Administered 2016-07-29 – 2016-07-30 (×4): 1 mg via ORAL
  Filled 2016-07-29 (×4): qty 1

## 2016-07-29 MED ORDER — BISACODYL 10 MG RE SUPP
10.0000 mg | Freq: Every day | RECTAL | Status: DC | PRN
  Administered 2016-07-29: 10 mg via RECTAL
  Filled 2016-07-29: qty 1

## 2016-07-29 MED ORDER — FLEET ENEMA 7-19 GM/118ML RE ENEM
1.0000 | ENEMA | Freq: Once | RECTAL | Status: AC
  Administered 2016-07-29: 1 via RECTAL
  Filled 2016-07-29: qty 1

## 2016-07-29 NOTE — Progress Notes (Signed)
Patient ID: Katrina Fernandez, female   DOB: 1965/09/16, 51 y.o.   MRN: [id number] Subjective: Patient reports headache and blurry vision, was OOB yesterday  Objective: Vital signs in last 24 hours: Temp:  [97.6 F (36.4 C)-98.7 F (37.1 C)] 97.6 F (36.4 C) (03/14 0500) Pulse Rate:  [84-104] 84 (03/14 0500) Resp:  [15-18] 18 (03/14 0500) BP: (119-161)/(82-97) 143/92 (03/14 0500) SpO2:  [96 %-99 %] 98 % (03/14 0500)  Intake/Output from previous day: 03/13 0701 - 03/14 0700 In: -  Out: 3 [Urine:3] Intake/Output this shift: No intake/output data recorded.  Neurologic: Grossly normal  Lab Results: Lab Results  Component Value Date   WBC 12.7 (H) 07/10/2016   HGB 13.8 07/10/2016   HCT 43.1 07/10/2016   MCV 90.2 07/10/2016   PLT 265 07/10/2016   Lab Results  Component Value Date   INR 1.05 07/10/2016   BMET Lab Results  Component Value Date   NA 141 07/10/2016   K 4.3 07/10/2016   CL 106 07/10/2016   CO2 25 07/10/2016   GLUCOSE 167 (H) 07/10/2016   BUN 8 07/10/2016   CREATININE 0.88 07/10/2016   CALCIUM 9.1 07/10/2016    Studies/Results: No results found.  Assessment/Plan: CT head  Follow U/A cx  Magic mouth wash   LOS: 6 days    Tona Qualley S 07/29/2016, 8:22 AM

## 2016-07-30 LAB — URINE CULTURE: Culture: >=100000 — AB

## 2016-07-30 MED ORDER — OXYCODONE HCL 5 MG PO TABS: 5.0000 mg | ORAL_TABLET | Freq: Four times a day (QID) | ORAL | 0 refills | Status: DC | PRN

## 2016-07-30 NOTE — Progress Notes (Signed)
Discharge instructions completed; pt. Verbalized understanding. Await pt.'s transportation.

## 2016-07-30 NOTE — Care Management Note (Signed)
Case Management Note  Patient Details  Name: SALENE MOHAMUD MRN: [id number] Date of Birth: October 18, 1965  Subjective/Objective:                    Action/Plan: Pt discharging home with self care. Pt with insurance, PCP and transportation home. No further needs per CM.   Expected Discharge Date:  07/30/16               Expected Discharge Plan:  Home/Self Care  In-House Referral:     Discharge planning Services     Post Acute Care Choice:    Choice offered to:     DME Arranged:    DME Agency:     HH Arranged:    HH Agency:     Status of Service:  Completed, signed off  If discussed at [employer] of Stay Meetings, dates discussed:    Additional Comments:  Pollie Friar, RN 07/30/2016, 10:32 AM

## 2016-07-30 NOTE — Discharge Summary (Signed)
Physician Discharge Summary  Patient ID: Katrina Fernandez MRN: [id number] DOB/AGE: 1966/03/09 51 y.o.  Admit date: 07/23/2016 Discharge date: 07/30/2016  Admission Diagnoses: pseudomeningocele    Discharge Diagnoses: same   Discharged Condition: stable  Hospital Course: The patient was admitted on 07/23/2016 and taken to the operating room where the patient underwent pseudomeningocele repair. The patient tolerated the procedure well and was taken to the recovery room and then to the floor in stable condition. The hospital course was routine. There were no complications. The wound remained clean dry and intact. Pt had appropriate back soreness. No complaints of leg pain or new N/T/W. The patient remained afebrile with stable vital signs, and tolerated a regular diet. The patient continued to increase activities, and pain was well controlled with oral pain medications. Pt was flat for a few days. Had persistent occipital headaches. CT head looked better, but still mild chiari.  Consults: None  Significant Diagnostic Studies:  Results for orders placed or performed during the hospital encounter of 07/23/16  Culture, Urine  Result Value Ref Range   Specimen Description URINE, CATHETERIZED    Special Requests NONE    Culture (A)     >=100,000 COLONIES/mL PROTEUS MIRABILIS SUSCEPTIBILITIES TO FOLLOW    Report Status PENDING   Glucose, capillary  Result Value Ref Range   Glucose-Capillary 230 (H) 65 - 99 mg/dL  Urinalysis, Routine w reflex microscopic  Result Value Ref Range   Color, Urine YELLOW YELLOW   APPearance TURBID (A) CLEAR   Specific Gravity, Urine 1.012 1.005 - 1.030   pH 8.0 5.0 - 8.0   Glucose, UA NEGATIVE NEGATIVE mg/dL   Hgb urine dipstick SMALL (A) NEGATIVE   Bilirubin Urine NEGATIVE NEGATIVE   Ketones, ur NEGATIVE NEGATIVE mg/dL   Protein, ur 30 (A) NEGATIVE mg/dL   Nitrite NEGATIVE NEGATIVE   Leukocytes, UA LARGE (A) NEGATIVE   RBC / HPF 6-30 0 - 5 RBC/hpf   WBC,  UA 6-30 0 - 5 WBC/hpf   Bacteria, UA RARE (A) NONE SEEN   Squamous Epithelial / LPF 0-5 (A) NONE SEEN   Mucous PRESENT    Amorphous Crystal PRESENT    Triple Phosphate Crystal PRESENT     Dg Ankle Complete Right  Result Date: 07/03/2016 CLINICAL DATA:  Fall. EXAM: RIGHT ANKLE - COMPLETE 3+ VIEW COMPARISON:  No recent prior. FINDINGS: Diffuse soft tissue swelling. No evidence of fracture or dislocation. No acute bony or joint abnormality identified. Diffuse degenerative change. IMPRESSION: 1.  Diffuse soft tissue swelling.  No acute bony abnormality. 2.  Diffuse degenerative change. Electronically Signed   By: Marcello Moores  Register   On: 07/03/2016 11:20   Ct Head Wo Contrast  Result Date: 07/29/2016 CLINICAL DATA:  51 year old female with progressive headache yesterday afternoon. Status post removal of chronic lumbar peritoneal shunt on 07/10/2016, and subsequent lumbar laminectomy at L2-L3 for exposure and closure of CSF leak at the former shunt track on 07/23/2016. EXAM: CT HEAD WITHOUT CONTRAST TECHNIQUE: Contiguous axial images were obtained from the base of the skull through the vertex without intravenous contrast. COMPARISON:  Head CTs 07/23/2016 and earlier. FINDINGS: Brain: Normal cerebral volume. Stable and normal ventricle size and configuration. Stable to improved mild cerebellar tonsillar ectopia on the right. No midline shift, mass effect, or evidence of intracranial mass lesion. No acute intracranial hemorrhage identified. Gray-white matter differentiation is within normal limits throughout the brain. No cortically based acute infarct identified. Vascular: No suspicious intracranial vascular hyperdensity. Skull: No osseous abnormality  identified. Sinuses/Orbits: Visualized paranasal sinuses and mastoids are stable and well pneumatized. Other: Negative orbit and scalp soft tissues. IMPRESSION: Negative noncontrast CT appearance of the brain. No ventriculomegaly and stable to improved mild  cerebellar tonsillar ectopia. Electronically Signed   By: Genevie Ann M.D.   On: 07/29/2016 09:23   Ct Head Wo Contrast  Result Date: 07/23/2016 CLINICAL DATA:  51 y/o female status post removal of chronic lumbar peritoneal shunt on 07/10/2016 following presentation of progressive headache and suggestion of worsening cerebellar tonsillar ectopia on head CT (07/09/2016). Subsequent back pain and swelling around incision, suspected primary CSF leak at prior shunt site. EXAM: CT HEAD WITHOUT CONTRAST TECHNIQUE: Contiguous axial images were obtained from the base of the skull through the vertex without intravenous contrast. COMPARISON:  Head CT without and with contrast 07/09/2016 and earlier. FINDINGS: Brain: Stable cerebral volume. Stable ventricle size and configuration. No ventriculomegaly. Basilar cisterns appear largely stable, although mild crowding of the cisterna magna may be improved (sagittal image 37). No midline shift, evidence of mass lesion, intracranial hemorrhage or evidence of cortically based acute infarction. Gray-white matter differentiation is within normal limits throughout the brain. No encephalomalacia identified. Vascular: No suspicious intracranial vascular hyperdensity. Skull: No osseous abnormality identified. Sinuses/Orbits: Visualized paranasal sinuses and mastoids are stable and well pneumatized. Other: Visualized orbits and scalp soft tissues are within normal limits. IMPRESSION: No acute intracranial abnormality. Stable noncontrast CT appearance of the brain aside from perhaps mildly improved patency of the cisterna magna. Electronically Signed   By: Genevie Ann M.D.   On: 07/23/2016 10:52   Ct Head W & Wo Contrast  Result Date: 07/09/2016 CLINICAL DATA:  Headaches for 4 days, worse with standing. EXAM: CT HEAD WITHOUT AND WITH CONTRAST TECHNIQUE: Contiguous axial images were obtained from the base of the skull through the vertex without and with intravenous contrast CONTRAST:  41mL  ISOVUE-300 IOPAMIDOL (ISOVUE-300) INJECTION 61% COMPARISON:  CT head 08/18/2009.  MRI cervical spine 03/11/2013. FINDINGS: Brain: No evidence for acute infarction, hemorrhage, mass lesion, hydrocephalus, or extra-axial fluid. Normal cerebral volume. No white matter disease. Subcentimeter osteoma or meningioma, RIGHT parietal convexity, may be slightly more calcified but not clearly larger, compared with 2011. No adjacent edema. Post infusion, no abnormal enhancement. Tonsillar herniation, better evaluated on MR, redemonstrated, estimated 10 mm descent below can imaginary line across the foramen magnum. Vascular: No hyperdense vessel or unexpected calcification. Visible vessels are patent. Skull: Normal. Negative for fracture or focal lesion. Sinuses/Orbits: No acute finding. Other: None. IMPRESSION: No acute intracranial findings. No abnormal postcontrast enhancement. Tonsillar descent of 10 mm, consistent with Chiari 1 malformation. This may be increased from 2014. Electronically Signed   By: Staci Righter M.D.   On: 07/09/2016 13:35   Dg Lumbar Spine 1 View  Result Date: 07/23/2016 CLINICAL DATA:  Decompressive lumbar laminectomy at L2-3 for repair of cerebral spinal fluid leak. EXAM: LUMBAR SPINE - 1 VIEW COMPARISON:  06/02/2011 radiographs of the lumbar spine FINDINGS: Portable cross-table view of the lumbar spine demonstrates the metallic probe projecting over the facets at L2-3, just posterior to the L3 pedicle. Spinal fusion hardware with interbody blocks noted at L4-5 and L5-S1 as before. Neurostimulator device projects within the soft tissues posterior to the L2 and L3 levels. IMPRESSION: Metallic probe at the L3 pedicle level overlying the L2-3 facets on the lateral view. L4-5 and L5-S1 spinal fusion. Electronically Signed   By: Ashley Royalty M.D.   On: 07/23/2016 20:35    Antibiotics:  Anti-infectives    Start     Dose/Rate Route Frequency Ordered Stop   07/24/16 0500  vancomycin (VANCOCIN) 1,250  mg in sodium chloride 0.9 % 250 mL IVPB     1,250 mg 166.7 mL/hr over 90 Minutes Intravenous  Once 07/23/16 2055 07/24/16 0622   07/23/16 1732  bacitracin 50,000 Units in sodium chloride irrigation 0.9 % 500 mL irrigation  Status:  Discontinued       As needed 07/23/16 1733 07/23/16 1857      Discharge Exam: Blood pressure 138/87, pulse (!) 102, temperature 97.7 F (36.5 C), temperature source Oral, resp. rate 18, height 5\' 3"  (1.6 m), weight 101.8 kg (224 lb 6.8 oz), SpO2 98 %. Neurologic: Grossly normal Incision CDI  Discharge Medications:   Allergies as of 07/30/2016      Reactions   Penicillins Anaphylaxis   Has patient had a PCN reaction causing immediate rash, facial/tongue/throat swelling, SOB or lightheadedness with hypotension: Yes Has patient had a PCN reaction causing severe rash involving mucus membranes or skin necrosis: No Has patient had a PCN reaction that required hospitalization No Has patient had a PCN reaction occurring within the last 10 years: No If all of the above answers are "NO", then may proceed with Cephalosporin use.   Chlorhexidine Other (See Comments)   Dermatitis - Allergic   Lactose Swelling, Rash   Other Swelling, Rash   Silicone Tape   Tape Rash   Plastic tape      Medication List    TAKE these medications   cyclobenzaprine 10 MG tablet Commonly known as:  FLEXERIL Take 1 tablet (10 mg total) by mouth 3 (three) times daily as needed for muscle spasms.   DULoxetine 60 MG capsule Commonly known as:  CYMBALTA Take 2 capsules (120 mg total) by mouth daily.   ferrous sulfate 325 (65 FE) MG tablet Take 325 mg by mouth at bedtime.   furosemide 20 MG tablet Commonly known as:  LASIX TAKE 2 TABLETS BY MOUTH EVERY MORNING AND 1 TABLET EVERY AFTEROON, ONLY AS NEEDED FOR LEG SWELLING What changed:  See the new instructions.   levETIRAcetam 500 MG tablet Commonly known as:  KEPPRA Take 500 mg by mouth 2 (two) times daily.   meloxicam 7.5 MG  tablet Commonly known as:  MOBIC Take 7.5 mg by mouth daily.   methadone 5 MG tablet Commonly known as:  DOLOPHINE Take 2.5 mg by mouth every morning.   montelukast 10 MG tablet Commonly known as:  SINGULAIR TAKE 1 TABLET (10 MG TOTAL) BY MOUTH AT BEDTIME. What changed:  how much to take  how to take this  when to take this  additional instructions   oxyCODONE 5 MG immediate release tablet Commonly known as:  Oxy IR/ROXICODONE Take 1-2 tablets (5-10 mg total) by mouth every 6 (six) hours as needed for breakthrough pain.   pramipexole 0.125 MG tablet Commonly known as:  MIRAPEX Take 3 tablets (0.375 mg total) by mouth at bedtime. (2-3 hours prior to bedtime)   simvastatin 20 MG tablet Commonly known as:  ZOCOR Take 1 tablet (20 mg total) by mouth daily. NEED FOLLOW UP VISIT FOR MORE REFILLS What changed:  when to take this  additional instructions   tiZANidine 4 MG capsule Commonly known as:  ZANAFLEX Take 4-8 mg by mouth 2 (two) times daily. ONE TAB AM - PRN AND 2 TABS AT BEDTIME   traMADol 50 MG tablet Commonly known as:  ULTRAM Take 50 mg by  mouth at bedtime.       Disposition: home   Final Dx: pseudomeningocele repair  Discharge Instructions    Call MD for:  persistant nausea and vomiting    Complete by:  As directed    Call MD for:  redness, tenderness, or signs of infection (pain, swelling, redness, odor or green/yellow discharge around incision site)    Complete by:  As directed    Call MD for:  severe uncontrolled pain    Complete by:  As directed    Call MD for:  temperature >100.4    Complete by:  As directed    Diet - low sodium heart healthy    Complete by:  As directed    Increase activity slowly    Complete by:  As directed          Signed: Twisha Vanpelt S 07/30/2016, 8:06 AM

## 2016-07-31 ENCOUNTER — Ambulatory Visit: Payer: Medicare Other | Admitting: Sports Medicine

## 2016-08-04 ENCOUNTER — Other Ambulatory Visit: Payer: Self-pay | Admitting: Sports Medicine

## 2016-08-07 ENCOUNTER — Encounter: Payer: Self-pay | Admitting: Sports Medicine

## 2016-08-07 ENCOUNTER — Other Ambulatory Visit: Payer: Self-pay | Admitting: Neurosurgery

## 2016-08-07 DIAGNOSIS — G9600 Cerebrospinal fluid leak, unspecified: Secondary | ICD-10-CM

## 2016-08-13 ENCOUNTER — Ambulatory Visit
Admission: RE | Admit: 2016-08-13 | Discharge: 2016-08-13 | Disposition: A | Discharge status: Home / Self Care | Payer: Medicare Other | Source: Ambulatory Visit | Attending: Neurosurgery | Admitting: Neurosurgery

## 2016-08-13 ENCOUNTER — Telehealth: Payer: Self-pay

## 2016-08-13 VITALS — BP 157/89 | HR 97

## 2016-08-13 DIAGNOSIS — G9600 Cerebrospinal fluid leak, unspecified: Secondary | ICD-10-CM

## 2016-08-13 DIAGNOSIS — G9782 Other postprocedural complications and disorders of nervous system: Secondary | ICD-10-CM

## 2016-08-13 MED ORDER — IOPAMIDOL (ISOVUE-M 200) INJECTION 41%
15.0000 mL | Freq: Once | INTRAMUSCULAR | Status: AC
  Administered 2016-08-13: 15 mL via INTRATHECAL

## 2016-08-13 MED ORDER — DIAZEPAM 5 MG PO TABS
10.0000 mg | ORAL_TABLET | Freq: Once | ORAL | Status: AC
  Administered 2016-08-13: 10 mg via ORAL

## 2016-08-13 MED ORDER — ZOLPIDEM TARTRATE 5 MG PO TABS: 5.0000 mg | ORAL_TABLET | Freq: Every evening | ORAL | 1 refills | Status: DC | PRN

## 2016-08-13 NOTE — Discharge Instructions (Signed)
Myelogram Discharge Instructions  1. Go home and rest quietly for the next 24 hours.  It is important to lie flat for the next 24 hours.  Get up only to go to the restroom.  You may lie in the bed or on a couch on your back, your stomach, your left side or your right side.  You may have one pillow under your head.  You may have pillows between your knees while you are on your side or under your knees while you are on your back.  2. DO NOT drive today.  Recline the seat as far back as it will go, while still wearing your seat belt, on the way home.  3. You may get up to go to the bathroom as needed.  You may sit up for 10 minutes to eat.  You may resume your normal diet and medications unless otherwise indicated.  Drink lots of extra fluids today and tomorrow.  4. The incidence of headache, nausea, or vomiting is about 5% (one in 20 patients).  If you develop a headache, lie flat and drink plenty of fluids until the headache goes away.  Caffeinated beverages may be helpful.  If you develop severe nausea and vomiting or a headache that does not go away with flat bed rest, call [phone].  5. You may resume normal activities after your 24 hours of bed rest is over; however, do not exert yourself strongly or do any heavy lifting tomorrow. If when you get up you have a headache when standing, go back to bed and force fluids for another 24 hours.  6. Call your physician for a follow-up appointment.  The results of your myelogram will be sent directly to your physician by the following day.  7. If you have any questions or if complications develop after you arrive home, please call [phone].  Discharge instructions have been explained to the patient.  The patient, or the person responsible for the patient, fully understands these instructions.       May resume Cymbalta and Tramadol on August 14, 2016, after 10:30 am.

## 2016-08-13 NOTE — Telephone Encounter (Signed)
Called pt back.  Will use low dose ambien.  Return to clinic.

## 2016-08-13 NOTE — Telephone Encounter (Signed)
Pt has had 2 emergency back surgeries and is on steroids.  She stated that she has not slept any the past 5 days.  Is requesting something for sleep.  Please advise.

## 2016-08-13 NOTE — Progress Notes (Signed)
Pt states she has been off Cymbalta and Tramadol for the past 4 days.

## 2016-08-14 ENCOUNTER — Encounter: Payer: Self-pay | Admitting: Family Medicine

## 2016-08-20 ENCOUNTER — Other Ambulatory Visit: Payer: Self-pay | Admitting: Neurosurgery

## 2016-08-20 DIAGNOSIS — G9600 Cerebrospinal fluid leak, unspecified: Secondary | ICD-10-CM

## 2016-08-24 ENCOUNTER — Other Ambulatory Visit: Payer: Self-pay | Admitting: Neurosurgery

## 2016-08-24 DIAGNOSIS — G9600 Cerebrospinal fluid leak, unspecified: Secondary | ICD-10-CM

## 2016-08-25 ENCOUNTER — Other Ambulatory Visit: Payer: Self-pay

## 2016-08-25 MED ORDER — PRAMIPEXOLE DIHYDROCHLORIDE 0.125 MG PO TABS: 0.3750 mg | ORAL_TABLET | Freq: Every day | ORAL | 1 refills | Status: DC

## 2016-08-26 ENCOUNTER — Other Ambulatory Visit: Payer: Medicare Other

## 2016-08-27 ENCOUNTER — Other Ambulatory Visit: Payer: Self-pay | Admitting: Neurosurgery

## 2016-08-27 DIAGNOSIS — G9600 Cerebrospinal fluid leak, unspecified: Secondary | ICD-10-CM

## 2016-08-27 DIAGNOSIS — R519 Headache, unspecified: Secondary | ICD-10-CM | POA: Insufficient documentation

## 2016-08-27 DIAGNOSIS — R51 Headache: Secondary | ICD-10-CM

## 2016-08-28 ENCOUNTER — Other Ambulatory Visit: Payer: Self-pay

## 2016-08-31 ENCOUNTER — Ambulatory Visit
Admission: RE | Admit: 2016-08-31 | Discharge: 2016-08-31 | Disposition: A | Discharge status: Home / Self Care | Payer: Medicare Other | Source: Ambulatory Visit | Attending: Neurosurgery | Admitting: Neurosurgery

## 2016-08-31 DIAGNOSIS — G9600 Cerebrospinal fluid leak, unspecified: Secondary | ICD-10-CM

## 2016-08-31 MED ORDER — IOPAMIDOL (ISOVUE-300) INJECTION 61%
75.0000 mL | Freq: Once | INTRAVENOUS | Status: AC | PRN
  Administered 2016-08-31: 75 mL via INTRAVENOUS

## 2016-09-03 ENCOUNTER — Ambulatory Visit (INDEPENDENT_AMBULATORY_CARE_PROVIDER_SITE_OTHER): Payer: Medicare Other | Admitting: Family Medicine

## 2016-09-03 ENCOUNTER — Encounter: Payer: Self-pay | Admitting: Family Medicine

## 2016-09-03 DIAGNOSIS — K130 Diseases of lips: Secondary | ICD-10-CM

## 2016-09-03 DIAGNOSIS — R635 Abnormal weight gain: Secondary | ICD-10-CM | POA: Insufficient documentation

## 2016-09-03 MED ORDER — LORCASERIN HCL 10 MG PO TABS: 1.0000 | ORAL_TABLET | Freq: Two times a day (BID) | ORAL | 1 refills | Status: DC

## 2016-09-03 NOTE — Progress Notes (Signed)
Katrina Fernandez is a 51 y.o. female who presents to [facility]: [facility] today for weight loss and cracking at the edges of her mouth.    She has been working on losing weight for some time now had been improving her diet and exercise with success until her and her daughter's health took a turn for the worse.  She has undergone lap band surgery in the past with failure.   She is interested in weight loss medication. Her sister has had success with Contrave.  She reports the edges of her mouth cracking since her last hospital stay.  Her tongue burns with acidic or salty foods as well.  She has been putting lotion on it without relief.  She has had multiple incidents of oral candidiasis in the past.  She denies dysphagia, fever.   Past Medical History:  Diagnosis Date  . Anemia   . Arthritis    "back and knees" (07/23/2016)  . Chiari malformation type I ([place]) dx'd 2014  . Chronic back pain    buldging, DDD; "neck and lower back" (07/23/2016)  . Chronic constipation    takes Miralax daily  . Chronic pain syndrome    takes Methadone and Keppra daily  . Depression    takes Effexor daily  . Fibromyalgia   . GERD (gastroesophageal reflux disease)    not taking anything at the present time  . History of DVT of lower extremity 07/2012   LEFT LEG --  3/ 2014.Was on a blood transfusion for 6 months  . History of kidney stones   . History of MRSA infection 10 yrs ago  . Hyperlipidemia    takes Simvastatin daily  . Migraine    "daily recently" (07/23/2016)  . Muscle spasm    takes Zanaflex nightly  . Peripheral edema    takes Lasix daily as needed  . Peripheral neuropathy   . Pneumonia    hx of-7-8 yrs ago  . PONV (postoperative nausea and vomiting)   . Seasonal allergies    takes Claritin daily  . Spinal headache    "since 2002; still get them; try to manage it w/lying flat"   . Weakness    and numbness in right hand   Past Surgical History:  Procedure Laterality Date  . ANTERIOR CERVICAL DECOMP/DISCECTOMY FUSION  07-25-2009   C5 -- C6  . APPENDECTOMY  age 63  . BACK SURGERY    . BREAST REDUCTION SURGERY Bilateral 06/07/2013   Procedure: BILATERAL MAMMARY REDUCTION  (BREAST);  Surgeon: Theodoro Kos, DO;  Location: [facility];  Service: Plastics;  Laterality: Bilateral;  . BREAST REDUCTION SURGERY Bilateral 06/07/2013   Procedure:  LIPOSUCTION;  Surgeon: Theodoro Kos, DO;  Location: [facility];  Service: Plastics;  Laterality: Bilateral;  . CARPAL TUNNEL RELEASE Bilateral 2001  . COLONOSCOPY  X 2   "polyps were benign"  . ESOPHAGOGASTRODUODENOSCOPY    . gastric banding undone  11/2014  . LAPAROSCOPIC CHOLECYSTECTOMY  ~ 2000  . LAPAROSCOPIC GASTRIC BANDING  2011  . LUMBAR LAMINECTOMY/DECOMPRESSION MICRODISCECTOMY N/A 07/23/2016   Procedure: Decompressive Lumbar Laminectomy Lumbar two-three  for Repair of  Cerberal Spinal Fluid  Leak;  Surgeon: Kary Kos, MD;  Location: [facility];  Service: Neurosurgery;  Laterality: N/A;  . PLACEMENT LUMBOPERITONEAL SHUNT FOR CEREBROSPINAL FLUID DIVERSION AWAY FROM C8 PERINEURAL CYST  09-05-2003  &  12-24-2003   08-31-2003  REMOVAL SHUNT  . POSTERIOR  CERVICAL LAMINECTOMY  03-01-2001   C7-8 and Exploration C8 perineural cyst  . POSTERIOR LUMBAR LAMINECTOMY L4-5/  DISKECTOMY L5-S1  05-20-2005  . RE-DO DECOMPRESSION LAMINECTOMY AND FUSION L4-5  &  L5-S1  12-20-2006   02-04-2007--  RE-EXPLORATION L4 to S1, I & D LUMBAR WOUND HEMATOMA  . REDUCTION MAMMAPLASTY    . SHUNT REMOVAL N/A 07/10/2016   Procedure: SHUNT REMOVAL;  Surgeon: Kary Kos, MD;  Location: [facility];  Service: Neurosurgery;  Laterality: N/A;  SHUNT REMOVAL  . SPINAL CORD STIMULATOR INSERTION N/A 04/26/2015   Procedure: LUMBAR SPINAL CORD STIMULATOR INSERTION;  Surgeon: Clydell Hakim, MD;  Location: [facility];  Service: Neurosurgery;   Laterality: N/A;  LUMBAR SPINAL CORD STIMULATOR INSERTION  . TENNIS ELBOW RELEASE/NIRSCHEL PROCEDURE Left 12/13/2014   Procedure: LEFT ELBOW LATERAL EPICONDYLAR DEBRIDEMENT AND OR REPAIR -RECONSTRUCTION ;  Surgeon: Iran Planas, MD;  Location: [facility];  Service: Orthopedics;  Laterality: Left;  . TONSILLECTOMY    . TOTAL ABDOMINAL HYSTERECTOMY  1994   w/  Bilateral Salpinoophorectomy   Social History  Substance Use Topics  . Smoking status: Never Smoker  . Smokeless tobacco: Never Used  . Alcohol use No   family history includes Colon cancer in her father; Ovarian cancer in her maternal grandmother; Pancreatic cancer in her maternal grandfather.  ROS as above:  Medications: Current Outpatient Prescriptions  Medication Sig Dispense Refill  . acetaZOLAMIDE (DIAMOX) 250 MG tablet     . cyclobenzaprine (FLEXERIL) 10 MG tablet Take 1 tablet (10 mg total) by mouth 3 (three) times daily as needed for muscle spasms. 30 tablet 0  . DULoxetine (CYMBALTA) 60 MG capsule Take 2 capsules (120 mg total) by mouth daily. 180 capsule 3  . ferrous sulfate 325 (65 FE) MG tablet Take 325 mg by mouth at bedtime.    . furosemide (LASIX) 20 MG tablet TAKE 2 TABLETS BY MOUTH EVERY MORNING AND 1 TABLET EVERY AFTEROON, ONLY AS NEEDED FOR LEG SWELLING (Patient taking differently: TAKE 1 TABLETS BY MOUTH EVERY MORNING AND 1 TABLET EVERY AFTEROON, ONLY AS NEEDED FOR LEG SWELLING) 90 tablet 0  . levETIRAcetam (KEPPRA) 500 MG tablet Take 500 mg by mouth 2 (two) times daily.     . methadone (DOLOPHINE) 5 MG tablet Take 2.5 mg by mouth every morning.   0  . montelukast (SINGULAIR) 10 MG tablet TAKE 1 TABLET (10 MG TOTAL) BY MOUTH AT BEDTIME. (Patient taking differently: Take 10 mg by mouth every morning. ) 30 tablet 11  . oxyCODONE (OXY IR/ROXICODONE) 5 MG immediate release tablet Take 1-2 tablets (5-10 mg total) by mouth every 6 (six) hours as needed for breakthrough pain. 30 tablet 0  . pramipexole  (MIRAPEX) 0.125 MG tablet Take 3 tablets (0.375 mg total) by mouth at bedtime. (2-3 hours prior to bedtime) 180 tablet 1  . predniSONE (DELTASONE) 20 MG tablet Take 20 mg by mouth.    . simvastatin (ZOCOR) 20 MG tablet Take 1 tablet (20 mg total) by mouth daily. NEED LAB WORK FOR MORE REFILLS 30 tablet 0  . tiZANidine (ZANAFLEX) 4 MG capsule Take 4-8 mg by mouth 2 (two) times daily. ONE TAB AM - PRN AND 2 TABS AT BEDTIME    . traMADol (ULTRAM) 50 MG tablet Take 50 mg by mouth at bedtime.     Marland Kitchen zolpidem (AMBIEN) 5 MG tablet Take 1 tablet (5 mg total) by mouth at bedtime as needed for sleep. 15 tablet 1  . Lorcaserin HCl  10 MG TABS Take 1 tablet by mouth 2 (two) times daily. 60 tablet 1   No current facility-administered medications for this visit.    Allergies  Allergen Reactions  . Penicillins Anaphylaxis    Has patient had a PCN reaction causing immediate rash, facial/tongue/throat swelling, SOB or lightheadedness with hypotension: Yes Has patient had a PCN reaction causing severe rash involving mucus membranes or skin necrosis: No Has patient had a PCN reaction that required hospitalization No Has patient had a PCN reaction occurring within the last 10 years: No If all of the above answers are "NO", then may proceed with Cephalosporin use.   . Chlorhexidine Other (See Comments)    Dermatitis - Allergic  . Lactose Swelling and Rash  . Other Swelling and Rash    Silicone Tape  . Tape Rash    Plastic tape    Health Maintenance Health Maintenance  Topic Date Due  . PAP SMEAR  10/17/2054 (Originally 08/23/1988)  . INFLUENZA VACCINE  12/16/2016  . TETANUS/TDAP  05/19/2019  . HIV Screening  Completed     Exam:  BP 121/80   Pulse (!) 120   Ht 5\' 3"  (1.6 m)   Wt 224 lb (101.6 kg)   SpO2 97%   BMI 39.68 kg/m  Gen: Well NAD obese HEENT: Erythematous with fissures at oral commissures  Lungs: Normal work of breathing. CTABL Heart: tachycardia no MRG Abd: NABS, Soft. Nondistended,  Nontender Exts: Brisk capillary refill, warm and well perfused.     Assessment and Plan: 51 y.o. female with   angular cheilitis likely due to candidiasis. patient is on oral prednisone and reports frequent oral thrush with her recent hospitalizations.  She has nystatin swish at home and will apply to afflicted area.   Hydrocortisone cream for inflammation.    weight loss- start belviq with FU in 1 month. Pt cannot take Contrave due to chronic opiates. Consider medical weight loss clinic   Discussed warning signs or symptoms. Please see discharge instructions. Patient expresses understanding.

## 2016-09-03 NOTE — Patient Instructions (Signed)
Thank you for coming in today.  Apply the nystatin solution to the corners of your mouth.  Use hydrocortisone cream as well and Vaseline.   Start belviq  Recheck in 1 month.   Consider medical weight loss clinic.   Lorcaserin oral tablets What is this medicine? LORCASERIN (lor ca SER in) is used to promote and maintain weight loss in obese patients. This medicine should be used with a reduced calorie diet and, if appropriate, an exercise program. This medicine may be used for other purposes; ask your health care provider or pharmacist if you have questions. COMMON BRAND NAME(S): Belviq What should I tell my health care provider before I take this medicine? They need to know if you have any of these conditions: -anatomical deformation of the penis, Peyronie's disease, or history of priapism (painful and prolonged erection) -diabetes -heart disease -history of blood diseases, like sickle cell anemia or leukemia -history of irregular heartbeat -kidney disease -liver disease -suicidal thoughts, plans, or attempt; a previous suicide attempt by you or a family member -an unusual or allergic reaction to lorcaserin, other medicines, foods, dyes, or preservatives -pregnant or trying to get pregnant -breast-feeding How should I use this medicine? Take this medicine by mouth with a glass of water. Follow the directions on the prescription label. You can take it with or without food. Take your medicine at regular intervals. Do not take it more often than directed. Do not stop taking except on your doctor's advice. Talk to your pediatrician regarding the use of this medicine in children. Special care may be needed. Overdosage: If you think you have taken too much of this medicine contact a poison control center or emergency room at once. NOTE: This medicine is only for you. Do not share this medicine with others. What if I miss a dose? If you miss a dose, take it as soon as you can. If it is  almost time for your next dose, take only that dose. Do not take double or extra doses. What may interact with this medicine? -cabergoline -certain medicines for depression, anxiety, or psychotic disturbances -certain medicines for erectile dysfunction -certain medicines for migraine headache like almotriptan, eletriptan, frovatriptan, naratriptan, rizatriptan, sumatriptan, zolmitriptan -dextromethorphan -linezolid -lithium -medicines for diabetes -other weight loss products -tramadol -St. John's Wort -stimulant medicines for attention disorders, weight loss, or to stay awake -tryptophan This list may not describe all possible interactions. Give your health care provider a list of all the medicines, herbs, non-prescription drugs, or dietary supplements you use. Also tell them if you smoke, drink alcohol, or use illegal drugs. Some items may interact with your medicine. What should I watch for while using this medicine? This medicine is intended to be used in addition to a healthy diet and appropriate exercise. The best results are achieved this way. Your doctor should instruct you to stop taking this medicine if you do not lose a certain amount of weight within the first 12 weeks of treatment, but it is important that you do not change your dose in any way without consulting your doctor or health care professional. Visit your doctor or health care professional for regular checkups. Your doctor may order blood tests or other tests to see how you are doing. Do not drive, use machinery, or do anything that needs mental alertness until you know how this medicine affects you. This medicine may affect blood sugar levels. If you have diabetes, check with your doctor or health care professional before you change your  diet or the dose of your diabetic medicine. Patients and their families should watch out for worsening depression or thoughts of suicide. Also watch out for sudden changes in feelings such  as feeling anxious, agitated, panicky, irritable, hostile, aggressive, impulsive, severely restless, overly excited and hyperactive, or not being able to sleep. If this happens, especially at the beginning of treatment or after a change in dose, call your health care professional. Contact your doctor or health care professional right away if you are a man with an erection that lasts longer than 4 hours or if the erection becomes painful. This may be a sign of serious problem and must be treated right away to prevent permanent damage. What side effects may I notice from receiving this medicine? Side effects that you should report to your doctor or health care professional as soon as possible: -allergic reactions like skin rash, itching or hives, swelling of the face, lips, or tongue -abnormal production of milk -breast enlargement in both males and females -breathing problems -changes in emotions or moods -changes in vision -confusion -erection lasting more than 4 hours or a painful erection -fast or irregular heart beat -feeling faint or lightheaded, falls -fever or chills, sore throat -hallucination, loss of contact with reality -high or low blood pressure -menstrual changes -restlessness -slow or irregular heartbeat -stiff muscles -sweating -suicidal thoughts or other mood changes -swelling of the ankles, feet, hands -unusually weak or tired -vomiting Side effects that usually do not require medical attention (report to your doctor or health care professional if they continue or are bothersome): -back pain -constipation -cough -dry mouth -nausea -tiredness This list may not describe all possible side effects. Call your doctor for medical advice about side effects. You may report side effects to FDA at 1-800-FDA-1088. Where should I keep my medicine? Keep out of the reach of children. This medicine can be abused. Keep your medicine in a safe place to protect it from theft. Do not  share this medicine with anyone. Selling or giving away this medicine is dangerous and against the law. Store at room temperature between 15 and 30 degrees C (59 and 86 degrees F). Throw away any unused medicine after the expiration date. NOTE: This sheet is a summary. It may not cover all possible information. If you have questions about this medicine, talk to your doctor, pharmacist, or health care provider.  2018 Elsevier/Gold Standard (2015-06-06 12:13:31)

## 2016-09-10 ENCOUNTER — Encounter: Payer: Self-pay | Admitting: *Deleted

## 2016-09-10 ENCOUNTER — Emergency Department
Admission: EM | Admit: 2016-09-10 | Discharge: 2016-09-10 | Disposition: A | Discharge status: Home / Self Care | Payer: Medicare Other | Source: Home / Self Care | Attending: Family Medicine | Admitting: Family Medicine

## 2016-09-10 DIAGNOSIS — R3129 Other microscopic hematuria: Secondary | ICD-10-CM

## 2016-09-10 DIAGNOSIS — R109 Unspecified abdominal pain: Secondary | ICD-10-CM | POA: Diagnosis not present

## 2016-09-10 DIAGNOSIS — Z87442 Personal history of urinary calculi: Secondary | ICD-10-CM

## 2016-09-10 LAB — POCT URINALYSIS DIP (MANUAL ENTRY)
Bilirubin, UA: NEGATIVE
Glucose, UA: NEGATIVE mg/dL
Ketones, POC UA: NEGATIVE mg/dL
Nitrite, UA: NEGATIVE
Protein Ur, POC: NEGATIVE mg/dL
Spec Grav, UA: 1.02 (ref 1.010–1.025)
Urobilinogen, UA: 0.2 E.U./dL
pH, UA: 7 (ref 5.0–8.0)

## 2016-09-10 MED ORDER — ONDANSETRON 4 MG PO TBDP
4.0000 mg | ORAL_TABLET | Freq: Once | ORAL | Status: AC
  Administered 2016-09-10: 4 mg via ORAL

## 2016-09-10 MED ORDER — ONDANSETRON HCL 4 MG PO TABS: 4.0000 mg | ORAL_TABLET | Freq: Four times a day (QID) | ORAL | 0 refills | Status: DC

## 2016-09-10 NOTE — ED Provider Notes (Signed)
CSN: [id number]     Arrival date & time 09/10/16  1626 History   First MD Initiated Contact with Patient 09/10/16 1703     Chief Complaint  Patient presents with  . Flank Pain  . Nausea   (Consider location/radiation/quality/duration/timing/severity/associated sxs/prior Treatment) HPI  Katrina Fernandez is a 51 y.o. female presenting to UC with c/o Right flank pain that radiates into her bladder, associated nausea since 10AM this morning.  Pt reports hx of kidney stones. Pain feels similar.  Denies dysuria or hematuria.  Denies fever or chills. She has had nausea and 1 episode of vomiting today. She has not tried any Tylenol or Motrin due to the nausea.  She has not f/u with a urologist in the past for prior kidney stones as they have all passed w/o further intervention.    Past Medical History:  Diagnosis Date  . Anemia   . Arthritis    "back and knees" (07/23/2016)  . Chiari malformation type I ([place]) dx'd 2014  . Chronic back pain    buldging, DDD; "neck and lower back" (07/23/2016)  . Chronic constipation    takes Miralax daily  . Chronic pain syndrome    takes Methadone and Keppra daily  . Depression    takes Effexor daily  . Fibromyalgia   . GERD (gastroesophageal reflux disease)    not taking anything at the present time  . History of DVT of lower extremity 07/2012   LEFT LEG --  3/ 2014.Was on a blood transfusion for 6 months  . History of kidney stones   . History of MRSA infection 10 yrs ago  . Hyperlipidemia    takes Simvastatin daily  . Migraine    "daily recently" (07/23/2016)  . Muscle spasm    takes Zanaflex nightly  . Peripheral edema    takes Lasix daily as needed  . Peripheral neuropathy   . Pneumonia    hx of-7-8 yrs ago  . PONV (postoperative nausea and vomiting)   . Seasonal allergies    takes Claritin daily  . Spinal headache    "since 2002; still get them; try to manage it w/lying flat"  . Weakness    and numbness in right hand   Past Surgical  History:  Procedure Laterality Date  . ANTERIOR CERVICAL DECOMP/DISCECTOMY FUSION  07-25-2009   C5 -- C6  . APPENDECTOMY  age 25  . BACK SURGERY    . BREAST REDUCTION SURGERY Bilateral 06/07/2013   Procedure: BILATERAL MAMMARY REDUCTION  (BREAST);  Surgeon: Theodoro Kos, DO;  Location: [facility];  Service: Plastics;  Laterality: Bilateral;  . BREAST REDUCTION SURGERY Bilateral 06/07/2013   Procedure:  LIPOSUCTION;  Surgeon: Theodoro Kos, DO;  Location: [facility];  Service: Plastics;  Laterality: Bilateral;  . CARPAL TUNNEL RELEASE Bilateral 2001  . COLONOSCOPY  X 2   "polyps were benign"  . ESOPHAGOGASTRODUODENOSCOPY    . gastric banding undone  11/2014  . LAPAROSCOPIC CHOLECYSTECTOMY  ~ 2000  . LAPAROSCOPIC GASTRIC BANDING  2011  . LUMBAR LAMINECTOMY/DECOMPRESSION MICRODISCECTOMY N/A 07/23/2016   Procedure: Decompressive Lumbar Laminectomy Lumbar two-three  for Repair of  Cerberal Spinal Fluid  Leak;  Surgeon: Kary Kos, MD;  Location: [facility];  Service: Neurosurgery;  Laterality: N/A;  . PLACEMENT LUMBOPERITONEAL SHUNT FOR CEREBROSPINAL FLUID DIVERSION AWAY FROM C8 PERINEURAL CYST  09-05-2003  &  12-24-2003   08-31-2003  REMOVAL SHUNT  . POSTERIOR CERVICAL LAMINECTOMY  03-01-2001   C7-8 and Exploration  C8 perineural cyst  . POSTERIOR LUMBAR LAMINECTOMY L4-5/  DISKECTOMY L5-S1  05-20-2005  . RE-DO DECOMPRESSION LAMINECTOMY AND FUSION L4-5  &  L5-S1  12-20-2006   02-04-2007--  RE-EXPLORATION L4 to S1, I & D LUMBAR WOUND HEMATOMA  . REDUCTION MAMMAPLASTY    . SHUNT REMOVAL N/A 07/10/2016   Procedure: SHUNT REMOVAL;  Surgeon: Kary Kos, MD;  Location: [facility];  Service: Neurosurgery;  Laterality: N/A;  SHUNT REMOVAL  . SPINAL CORD STIMULATOR INSERTION N/A 04/26/2015   Procedure: LUMBAR SPINAL CORD STIMULATOR INSERTION;  Surgeon: Clydell Hakim, MD;  Location: [facility];  Service: Neurosurgery;  Laterality: N/A;  LUMBAR SPINAL CORD STIMULATOR INSERTION  . TENNIS  ELBOW RELEASE/NIRSCHEL PROCEDURE Left 12/13/2014   Procedure: LEFT ELBOW LATERAL EPICONDYLAR DEBRIDEMENT AND OR REPAIR -RECONSTRUCTION ;  Surgeon: Iran Planas, MD;  Location: [facility];  Service: Orthopedics;  Laterality: Left;  . TONSILLECTOMY    . TOTAL ABDOMINAL HYSTERECTOMY  1994   w/  Bilateral Salpinoophorectomy   Family History  Problem Relation Age of Onset  . Depression    . Colon cancer Father   . Ovarian cancer Maternal Grandmother   . Pancreatic cancer Maternal Grandfather    Social History  Substance Use Topics  . Smoking status: Never Smoker  . Smokeless tobacco: Never Used  . Alcohol use No   OB History    No data available     Review of Systems  Constitutional: Negative for chills and fever.  Gastrointestinal: Negative for abdominal pain, diarrhea, nausea and vomiting.  Genitourinary: Positive for flank pain (Right). Negative for dysuria, frequency, hematuria, pelvic pain and urgency.  Musculoskeletal: Negative for arthralgias, back pain, joint swelling and myalgias.    Allergies  Penicillins; Chlorhexidine; Lactose; Other; and Tape  Home Medications   Prior to Admission medications   Medication Sig Start Date End Date Taking? Authorizing Provider  acetaZOLAMIDE (DIAMOX) 250 MG tablet  09/01/16   Historical Provider, MD  cyclobenzaprine (FLEXERIL) 10 MG tablet Take 1 tablet (10 mg total) by mouth 3 (three) times daily as needed for muscle spasms. 07/15/16   Eustace Moore, MD  DULoxetine (CYMBALTA) 60 MG capsule Take 2 capsules (120 mg total) by mouth daily. 07/01/16   Gregor Hams, MD  ferrous sulfate 325 (65 FE) MG tablet Take 325 mg by mouth at bedtime.    Historical Provider, MD  furosemide (LASIX) 20 MG tablet TAKE 2 TABLETS BY MOUTH EVERY MORNING AND 1 TABLET EVERY AFTEROON, ONLY AS NEEDED FOR LEG SWELLING Patient taking differently: TAKE 1 TABLETS BY MOUTH EVERY MORNING AND 1 TABLET EVERY AFTEROON, ONLY AS NEEDED FOR LEG SWELLING 07/13/16    Gregor Hams, MD  levETIRAcetam (KEPPRA) 500 MG tablet Take 500 mg by mouth 2 (two) times daily.     Historical Provider, MD  Lorcaserin HCl 10 MG TABS Take 1 tablet by mouth 2 (two) times daily. 09/03/16   Gregor Hams, MD  methadone (DOLOPHINE) 5 MG tablet Take 2.5 mg by mouth every morning.  06/23/15   Historical Provider, MD  montelukast (SINGULAIR) 10 MG tablet TAKE 1 TABLET (10 MG TOTAL) BY MOUTH AT BEDTIME. Patient taking differently: Take 10 mg by mouth every morning.  04/27/16   Gregor Hams, MD  ondansetron (ZOFRAN) 4 MG tablet Take 1 tablet (4 mg total) by mouth every 6 (six) hours. 09/10/16   Noland Fordyce, PA-C  oxyCODONE (OXY IR/ROXICODONE) 5 MG immediate release tablet Take 1-2 tablets (5-10 mg total)  by mouth every 6 (six) hours as needed for breakthrough pain. 07/30/16   Eustace Moore, MD  pramipexole (MIRAPEX) 0.125 MG tablet Take 3 tablets (0.375 mg total) by mouth at bedtime. (2-3 hours prior to bedtime) 08/25/16   Gregor Hams, MD  predniSONE (DELTASONE) 20 MG tablet Take 20 mg by mouth. 08/10/16   Historical Provider, MD  simvastatin (ZOCOR) 20 MG tablet Take 1 tablet (20 mg total) by mouth daily. NEED LAB WORK FOR MORE REFILLS 08/05/16   Gregor Hams, MD  tiZANidine (ZANAFLEX) 4 MG capsule Take 4-8 mg by mouth 2 (two) times daily. ONE TAB AM - PRN AND 2 TABS AT BEDTIME    Historical Provider, MD  traMADol (ULTRAM) 50 MG tablet Take 50 mg by mouth at bedtime.     Historical Provider, MD  zolpidem (AMBIEN) 5 MG tablet Take 1 tablet (5 mg total) by mouth at bedtime as needed for sleep. 08/13/16   Gregor Hams, MD   Meds Ordered and Administered this Visit   Medications  ondansetron (ZOFRAN-ODT) disintegrating tablet 4 mg (4 mg Oral Given 09/10/16 1657)    BP (!) 142/96 (BP Location: Left Arm)   Pulse (!) 107   Temp 97.6 F (36.4 C) (Oral)   Wt 224 lb (101.6 kg)   SpO2 98%   BMI 39.68 kg/m  No data found.   Physical Exam  Constitutional: She is oriented to person, place, and  time. She appears well-developed and well-nourished. No distress.  Pt lying on Left side on exam room. Appears mildly uncomfortable but I alert and cooperative during exam.  HENT:  Head: Normocephalic and atraumatic.  Mouth/Throat: Oropharynx is clear and moist.  Eyes: EOM are normal.  Neck: Normal range of motion. Neck supple.  Cardiovascular: Normal rate and regular rhythm.   Pulmonary/Chest: Effort normal and breath sounds normal. No respiratory distress. She has no wheezes.  Abdominal: Soft. She exhibits no distension and no mass. There is no tenderness. There is CVA tenderness (Right). There is no rebound and no guarding.  Musculoskeletal: Normal range of motion.  Neurological: She is alert and oriented to person, place, and time.  Skin: Skin is warm and dry. She is not diaphoretic.  Psychiatric: She has a normal mood and affect. Her behavior is normal.  Nursing note and vitals reviewed.   Urgent Care Course     Procedures (including critical care time)  Labs Review Labs Reviewed  POCT URINALYSIS DIP (MANUAL ENTRY) - Abnormal; Notable for the following:       Result Value   Blood, UA trace-intact (*)    Leukocytes, UA Small (1+) (*)    All other components within normal limits  URINE CULTURE    Imaging Review No results found.    MDM   1. Flank pain   2. Right flank pain   3. Microscopic hematuria   4. History of kidney stones    UA: trace hematuria w/o evidence of underlying infection.   Hx and exam c/w kidney stone and renal colic   According to [place] Controlled Substance database, pt has been prescribed oxycodone as well as methadone since the beginning of this month.    Encouraged good hydration and to take acetaminophen and ibuprofen as needed for pain. f/u with PCP next week if not improving, sooner if worsening.     Noland Fordyce, PA-C 09/10/16 [phone]

## 2016-09-10 NOTE — ED Triage Notes (Signed)
Patient c/o right flank pain that radiates to bladder and nausea x 10am today. H/o kidney stones

## 2016-09-13 LAB — URINE CULTURE

## 2016-09-14 ENCOUNTER — Telehealth: Payer: Self-pay | Admitting: *Deleted

## 2016-09-14 NOTE — Telephone Encounter (Signed)
Spoke to pt she reports that she passed a kidney stone and is feeling better. Given Ucx results. Per O'Malley,PA no ABT needed because of low colony count. Advised her if she develops new or worsening s/s to call back.

## 2016-09-15 DIAGNOSIS — E669 Obesity, unspecified: Secondary | ICD-10-CM | POA: Insufficient documentation

## 2016-09-20 ENCOUNTER — Other Ambulatory Visit: Payer: Self-pay | Admitting: Family Medicine

## 2016-09-22 ENCOUNTER — Telehealth: Payer: Self-pay

## 2016-09-22 MED ORDER — PRAMIPEXOLE DIHYDROCHLORIDE 0.125 MG PO TABS: 0.3750 mg | ORAL_TABLET | Freq: Every day | ORAL | 1 refills | Status: DC

## 2016-09-22 NOTE — Telephone Encounter (Signed)
Received a medical clarification phone call. Medication was dated 08/25/16. Call [phone] and use Ref # [id number].

## 2016-09-22 NOTE — Telephone Encounter (Signed)
Called [phone] and clarified the rx with the pharmacist.

## 2016-10-01 ENCOUNTER — Ambulatory Visit (INDEPENDENT_AMBULATORY_CARE_PROVIDER_SITE_OTHER): Payer: Medicare Other | Admitting: Family Medicine

## 2016-10-01 VITALS — BP 117/97 | HR 115 | Temp 97.7°F | Wt 227.0 lb

## 2016-10-01 DIAGNOSIS — R600 Localized edema: Secondary | ICD-10-CM

## 2016-10-01 DIAGNOSIS — R635 Abnormal weight gain: Secondary | ICD-10-CM

## 2016-10-01 DIAGNOSIS — E785 Hyperlipidemia, unspecified: Secondary | ICD-10-CM

## 2016-10-01 DIAGNOSIS — R739 Hyperglycemia, unspecified: Secondary | ICD-10-CM | POA: Diagnosis not present

## 2016-10-01 DIAGNOSIS — R7303 Prediabetes: Secondary | ICD-10-CM

## 2016-10-01 LAB — POCT GLYCOSYLATED HEMOGLOBIN (HGB A1C): Hemoglobin A1C: 5.8

## 2016-10-01 MED ORDER — FUROSEMIDE 40 MG PO TABS: 40.0000 mg | ORAL_TABLET | Freq: Two times a day (BID) | ORAL | 1 refills | Status: DC

## 2016-10-01 MED ORDER — LIRAGLUTIDE 18 MG/3ML ~~LOC~~ SOPN: PEN_INJECTOR | SUBCUTANEOUS | 3 refills | Status: DC

## 2016-10-01 NOTE — Progress Notes (Signed)
Katrina Fernandez is a 51 y.o. female who presents to [facility]: [place] today for leg swelling. Patient stopped acetazolamide due to intolerance. Since then she's had worsening swelling. She takes Lasix 40 mg in the morning which does help some. She is able to urinate with 40 mg. When she takes 20 mg she does not urinate. She notes a little bit of weight gain along with leg swelling. She denies shortness of breath chest pain or palpitations.  Additionally patient notes continued morbid obesity and abnormal weight gain. She was prescribed LV get the last visit but it was too expensive.  Additionally she notes that her blood sugar was found to be elevated at a different doctor's office and she was advised to follow-up for diabetes testing.   Past Medical History:  Diagnosis Date  . Anemia   . Arthritis    "back and knees" (07/23/2016)  . Chiari malformation type I ([place]) dx'd 2014  . Chronic back pain    buldging, DDD; "neck and lower back" (07/23/2016)  . Chronic constipation    takes Miralax daily  . Chronic pain syndrome    takes Methadone and Keppra daily  . Depression    takes Effexor daily  . Fibromyalgia   . GERD (gastroesophageal reflux disease)    not taking anything at the present time  . History of DVT of lower extremity 07/2012   LEFT LEG --  3/ 2014.Was on a blood transfusion for 6 months  . History of kidney stones   . History of MRSA infection 10 yrs ago  . Hyperlipidemia    takes Simvastatin daily  . Migraine    "daily recently" (07/23/2016)  . Muscle spasm    takes Zanaflex nightly  . Peripheral edema    takes Lasix daily as needed  . Peripheral neuropathy   . Pneumonia    hx of-7-8 yrs ago  . PONV (postoperative nausea and vomiting)   . Seasonal allergies    takes Claritin daily  . Spinal headache    "since 2002; still get them; try to manage it  w/lying flat"  . Weakness    and numbness in right hand   Past Surgical History:  Procedure Laterality Date  . ANTERIOR CERVICAL DECOMP/DISCECTOMY FUSION  07-25-2009   C5 -- C6  . APPENDECTOMY  age 18  . BACK SURGERY    . BREAST REDUCTION SURGERY Bilateral 06/07/2013   Procedure: BILATERAL MAMMARY REDUCTION  (BREAST);  Surgeon: Theodoro Kos, DO;  Location: [facility];  Service: Plastics;  Laterality: Bilateral;  . BREAST REDUCTION SURGERY Bilateral 06/07/2013   Procedure:  LIPOSUCTION;  Surgeon: Theodoro Kos, DO;  Location: [facility];  Service: Plastics;  Laterality: Bilateral;  . CARPAL TUNNEL RELEASE Bilateral 2001  . COLONOSCOPY  X 2   "polyps were benign"  . ESOPHAGOGASTRODUODENOSCOPY    . gastric banding undone  11/2014  . LAPAROSCOPIC CHOLECYSTECTOMY  ~ 2000  . LAPAROSCOPIC GASTRIC BANDING  2011  . LUMBAR LAMINECTOMY/DECOMPRESSION MICRODISCECTOMY N/A 07/23/2016   Procedure: Decompressive Lumbar Laminectomy Lumbar two-three  for Repair of  Cerberal Spinal Fluid  Leak;  Surgeon: Kary Kos, MD;  Location: [facility];  Service: Neurosurgery;  Laterality: N/A;  . PLACEMENT LUMBOPERITONEAL SHUNT FOR CEREBROSPINAL FLUID DIVERSION AWAY FROM C8 PERINEURAL CYST  09-05-2003  &  12-24-2003   08-31-2003  REMOVAL SHUNT  . POSTERIOR CERVICAL LAMINECTOMY  03-01-2001   C7-8 and Exploration C8  perineural cyst  . POSTERIOR LUMBAR LAMINECTOMY L4-5/  DISKECTOMY L5-S1  05-20-2005  . RE-DO DECOMPRESSION LAMINECTOMY AND FUSION L4-5  &  L5-S1  12-20-2006   02-04-2007--  RE-EXPLORATION L4 to S1, I & D LUMBAR WOUND HEMATOMA  . REDUCTION MAMMAPLASTY    . SHUNT REMOVAL N/A 07/10/2016   Procedure: SHUNT REMOVAL;  Surgeon: Kary Kos, MD;  Location: [facility];  Service: Neurosurgery;  Laterality: N/A;  SHUNT REMOVAL  . SPINAL CORD STIMULATOR INSERTION N/A 04/26/2015   Procedure: LUMBAR SPINAL CORD STIMULATOR INSERTION;  Surgeon: Clydell Hakim, MD;  Location: [facility];  Service:  Neurosurgery;  Laterality: N/A;  LUMBAR SPINAL CORD STIMULATOR INSERTION  . TENNIS ELBOW RELEASE/NIRSCHEL PROCEDURE Left 12/13/2014   Procedure: LEFT ELBOW LATERAL EPICONDYLAR DEBRIDEMENT AND OR REPAIR -RECONSTRUCTION ;  Surgeon: Iran Planas, MD;  Location: [facility];  Service: Orthopedics;  Laterality: Left;  . TONSILLECTOMY    . TOTAL ABDOMINAL HYSTERECTOMY  1994   w/  Bilateral Salpinoophorectomy   Social History  Substance Use Topics  . Smoking status: Never Smoker  . Smokeless tobacco: Never Used  . Alcohol use No   family history includes Colon cancer in her father; Ovarian cancer in her maternal grandmother; Pancreatic cancer in her maternal grandfather.  ROS as above:  Medications: Current Outpatient Prescriptions  Medication Sig Dispense Refill  . acetaZOLAMIDE (DIAMOX) 250 MG tablet     . cyclobenzaprine (FLEXERIL) 10 MG tablet Take 1 tablet (10 mg total) by mouth 3 (three) times daily as needed for muscle spasms. (Patient not taking: Reported on 10/01/2016) 30 tablet 0  . DULoxetine (CYMBALTA) 60 MG capsule Take 2 capsules (120 mg total) by mouth daily. 180 capsule 3  . ferrous sulfate 325 (65 FE) MG tablet Take 325 mg by mouth at bedtime.    . furosemide (LASIX) 40 MG tablet Take 1 tablet (40 mg total) by mouth 2 (two) times daily. 60 tablet 1  . levETIRAcetam (KEPPRA) 500 MG tablet Take 500 mg by mouth 2 (two) times daily.     Marland Kitchen liraglutide (VICTOZA) 18 MG/3ML SOPN Started 0.6 mg injected subcutaneously daily for one week, then increase by 0.6 mg weekly until reaching 1.8 mg injected daily.  Please include needles. 9 mL 3  . Lorcaserin HCl 10 MG TABS Take 1 tablet by mouth 2 (two) times daily. (Patient not taking: Reported on 10/01/2016) 60 tablet 1  . methadone (DOLOPHINE) 5 MG tablet Take 2.5 mg by mouth every morning.   0  . montelukast (SINGULAIR) 10 MG tablet TAKE 1 TABLET (10 MG TOTAL) BY MOUTH AT BEDTIME. (Patient taking differently: Take 10 mg by mouth  every morning. ) 30 tablet 11  . ondansetron (ZOFRAN) 4 MG tablet Take 1 tablet (4 mg total) by mouth every 6 (six) hours. 12 tablet 0  . oxyCODONE (OXY IR/ROXICODONE) 5 MG immediate release tablet Take 1-2 tablets (5-10 mg total) by mouth every 6 (six) hours as needed for breakthrough pain. 30 tablet 0  . pramipexole (MIRAPEX) 0.125 MG tablet Take 3 tablets (0.375 mg total) by mouth at bedtime. 270 tablet 1  . predniSONE (DELTASONE) 20 MG tablet Take 20 mg by mouth.    . simvastatin (ZOCOR) 20 MG tablet Take 1 tablet (20 mg total) by mouth daily. Due for lab work 30 tablet 0  . tiZANidine (ZANAFLEX) 4 MG capsule Take 4-8 mg by mouth 2 (two) times daily. ONE TAB AM - PRN AND 2 TABS AT BEDTIME    .  traMADol (ULTRAM) 50 MG tablet Take 50 mg by mouth at bedtime.     Marland Kitchen zolpidem (AMBIEN) 5 MG tablet Take 1 tablet (5 mg total) by mouth at bedtime as needed for sleep. 15 tablet 1   No current facility-administered medications for this visit.    Allergies  Allergen Reactions  . Penicillins Anaphylaxis    Has patient had a PCN reaction causing immediate rash, facial/tongue/throat swelling, SOB or lightheadedness with hypotension: Yes Has patient had a PCN reaction causing severe rash involving mucus membranes or skin necrosis: No Has patient had a PCN reaction that required hospitalization No Has patient had a PCN reaction occurring within the last 10 years: No If all of the above answers are "NO", then may proceed with Cephalosporin use.   . Chlorhexidine Other (See Comments)    Dermatitis - Allergic  . Lactose Swelling and Rash  . Other Swelling and Rash    Silicone Tape  . Tape Rash    Plastic tape    Health Maintenance Health Maintenance  Topic Date Due  . PAP SMEAR  10/17/2054 (Originally 08/23/1988)  . INFLUENZA VACCINE  12/16/2016  . TETANUS/TDAP  05/19/2019  . HIV Screening  Completed     Exam:  BP (!) 117/97 (BP Location: Left Arm, Patient Position: Sitting, Cuff Size: Normal)    Pulse (!) 115   Temp 97.7 F (36.5 C) (Oral)   Wt 227 lb (103 kg)   SpO2 99%   BMI 40.21 kg/m   Wt Readings from Last 10 Encounters:  10/01/16 227 lb (103 kg)  09/10/16 224 lb (101.6 kg)  09/03/16 224 lb (101.6 kg)  07/23/16 224 lb 6.8 oz (101.8 kg)  07/09/16 224 lb 6.9 oz (101.8 kg)  07/03/16 220 lb 4.8 oz (99.9 kg)  04/21/16 215 lb (97.5 kg)  02/20/16 218 lb (98.9 kg)  12/05/15 224 lb 4.8 oz (101.7 kg)  11/27/15 222 lb (100.7 kg)    Gen: Well NAD HEENT: EOMI,  MMM No increased JVD Lungs: Normal work of breathing. CTABL Heart: RRR no MRG Abd: NABS, Soft. Nondistended, Nontender Exts: Brisk capillary refill, warm and well perfused. Trace lower extremity edema bilaterally   Results for orders placed or performed in visit on 10/01/16 (from the past 72 hour(s))  POCT HgB A1C     Status: None   Collection Time: 10/01/16 11:28 AM  Result Value Ref Range   Hemoglobin A1C 5.8    No results found.    Assessment and Plan: 50 y.o. female with  Fluid retention and leg swelling. Plan to increase Lasix to twice daily. Check metabolic panel. Check in one month.  Prediabetes Associated with morbid obesity. Plan for starting Victoza recheck in 1 month.   Orders Placed This Encounter  Procedures  . CBC  . COMPLETE METABOLIC PANEL WITH GFR  . TSH  . POCT HgB A1C   Meds ordered this encounter  Medications  . furosemide (LASIX) 40 MG tablet    Sig: Take 1 tablet (40 mg total) by mouth 2 (two) times daily.    Dispense:  60 tablet    Refill:  1  . liraglutide (VICTOZA) 18 MG/3ML SOPN    Sig: Started 0.6 mg injected subcutaneously daily for one week, then increase by 0.6 mg weekly until reaching 1.8 mg injected daily.  Please include needles.    Dispense:  9 mL    Refill:  3     Discussed warning signs or symptoms. Please see discharge instructions. Patient  expresses understanding.

## 2016-10-01 NOTE — Patient Instructions (Signed)
Thank you for coming in today. Take 40mg  lasix twice daily (breakfast and after lunch).  Get labs today.  Start victoza.  Recheck in 1 month.   Liraglutide injection What is this medicine? LIRAGLUTIDE (LIR a GLOO tide) is used to improve blood sugar control in adults with type 2 diabetes. This medicine may be used with other diabetes medicines. This drug may also reduce the risk of heart attack or stroke if you have type 2 diabetes and risk factors for heart disease. This medicine may be used for other purposes; ask your health care provider or pharmacist if you have questions. COMMON BRAND NAME(S): Victoza What should I tell my health care provider before I take this medicine? They need to know if you have any of these conditions: -endocrine tumors (MEN 2) or if someone in your family had these tumors -gallbladder disease -high cholesterol -history of alcohol abuse problem -history of pancreatitis -kidney disease or if you are on dialysis -liver disease -previous swelling of the tongue, face, or lips with difficulty breathing, difficulty swallowing, hoarseness, or tightening of the throat -stomach problems -thyroid cancer or if someone in your family had thyroid cancer -an unusual or allergic reaction to liraglutide, other medicines, foods, dyes, or preservatives -pregnant or trying to get pregnant -breast-feeding How should I use this medicine? This medicine is for injection under the skin of your upper leg, stomach area, or upper arm. You will be taught how to prepare and give this medicine. Use exactly as directed. Take your medicine at regular intervals. Do not take it more often than directed. It is important that you put your used needles and syringes in a special sharps container. Do not put them in a trash can. If you do not have a sharps container, call your pharmacist or healthcare provider to get one. A special MedGuide will be given to you by the pharmacist with each  prescription and refill. Be sure to read this information carefully each time. Talk to your pediatrician regarding the use of this medicine in children. Special care may be needed. Overdosage: If you think you have taken too much of this medicine contact a poison control center or emergency room at once. NOTE: This medicine is only for you. Do not share this medicine with others. What if I miss a dose? If you miss a dose, take it as soon as you can. If it is almost time for your next dose, take only that dose. Do not take double or extra doses. What may interact with this medicine? -other medicines for diabetes Many medications may cause changes in blood sugar, these include: -alcohol containing beverages -antiviral medicines for HIV or AIDS -aspirin and aspirin-like drugs -certain medicines for blood pressure, heart disease, irregular heart beat -chromium -diuretics -female hormones, such as estrogens or progestins, birth control pills -fenofibrate -gemfibrozil -isoniazid -lanreotide -female hormones or anabolic steroids -MAOIs like Carbex, Eldepryl, Marplan, Nardil, and Parnate -medicines for weight loss -medicines for allergies, asthma, cold, or cough -medicines for depression, anxiety, or psychotic disturbances -niacin -nicotine -NSAIDs, medicines for pain and inflammation, like ibuprofen or naproxen -octreotide -pasireotide -pentamidine -phenytoin -probenecid -quinolone antibiotics such as ciprofloxacin, levofloxacin, ofloxacin -some herbal dietary supplements -steroid medicines such as prednisone or cortisone -sulfamethoxazole; trimethoprim -thyroid hormones Some medications can hide the warning symptoms of low blood sugar (hypoglycemia). You may need to monitor your blood sugar more closely if you are taking one of these medications. These include: -beta-blockers, often used for high blood pressure or heart  problems (examples include atenolol, metoprolol,  propranolol) -clonidine -guanethidine -reserpine This list may not describe all possible interactions. Give your health care provider a list of all the medicines, herbs, non-prescription drugs, or dietary supplements you use. Also tell them if you smoke, drink alcohol, or use illegal drugs. Some items may interact with your medicine. What should I watch for while using this medicine? Visit your doctor or health care professional for regular checks on your progress. Drink plenty of fluids while taking this medicine. Check with your doctor or health care professional if you get an attack of severe diarrhea, nausea, and vomiting. The loss of too much body fluid can make it dangerous for you to take this medicine. A test called the HbA1C (A1C) will be monitored. This is a simple blood test. It measures your blood sugar control over the last 2 to 3 months. You will receive this test every 3 to 6 months. Learn how to check your blood sugar. Learn the symptoms of low and high blood sugar and how to manage them. Always carry a quick-source of sugar with you in case you have symptoms of low blood sugar. Examples include hard sugar candy or glucose tablets. Make sure others know that you can choke if you eat or drink when you develop serious symptoms of low blood sugar, such as seizures or unconsciousness. They must get medical help at once. Tell your doctor or health care professional if you have high blood sugar. You might need to change the dose of your medicine. If you are sick or exercising more than usual, you might need to change the dose of your medicine. Do not skip meals. Ask your doctor or health care professional if you should avoid alcohol. Many nonprescription cough and cold products contain sugar or alcohol. These can affect blood sugar. Pens should never be shared. Even if the needle is changed, sharing may result in passing of viruses like hepatitis or HIV. Wear a medical ID bracelet or chain,  and carry a card that describes your disease and details of your medicine and dosage times. What side effects may I notice from receiving this medicine? Side effects that you should report to your doctor or health care professional as soon as possible: -allergic reactions like skin rash, itching or hives, swelling of the face, lips, or tongue -breathing problems -diarrhea that continues or is severe -lump or swelling on the neck -severe nausea -signs and symptoms of infection like fever or chills; cough; sore throat; pain or trouble passing urine -signs and symptoms of low blood sugar such as feeling anxious, confusion, dizziness, increased hunger, unusually weak or tired, sweating, shakiness, cold, irritable, headache, blurred vision, fast heartbeat, loss of consciousness -signs and symptoms of kidney injury like trouble passing urine or change in the amount of urine -trouble swallowing -unusual stomach upset or pain -vomiting Side effects that usually do not require medical attention (report to your doctor or health care professional if they continue or are bothersome): -constipation -decreased appetite -diarrhea -fatigue -headache -nausea -pain, redness, or irritation at site where injected -stomach upset -stuffy or runny nose This list may not describe all possible side effects. Call your doctor for medical advice about side effects. You may report side effects to FDA at 1-800-FDA-1088. Where should I keep my medicine? Keep out of the reach of children. Store unopened pen in a refrigerator between 2 and 8 degrees C (36 and 46 degrees F). Do not freeze or use if the medicine has  been frozen. Protect from light and excessive heat. After you first use the pen, it can be stored at room temperature between 15 and 30 degrees C (59 and 86 degrees F) or in a refrigerator. Throw away your used pen after 30 days or after the expiration date, whichever comes first. Do not store your pen with the  needle attached. If the needle is left on, medicine may leak from the pen. NOTE: This sheet is a summary. It may not cover all possible information. If you have questions about this medicine, talk to your doctor, pharmacist, or health care provider.  2018 [employer] Standard (2016-05-21 14:39:40)

## 2016-10-01 NOTE — Progress Notes (Signed)
Pt is here for medication follow up.  Stated that since coming off Diamox, she has been retaining fluid.

## 2016-10-02 LAB — COMPLETE METABOLIC PANEL WITH GFR
ALT: 21 U/L (ref 6–29)
AST: 24 U/L (ref 10–35)
Albumin: 4.1 g/dL (ref 3.6–5.1)
Alkaline Phosphatase: 108 U/L (ref 33–115)
BUN: 9 mg/dL (ref 7–25)
CO2: 25 mmol/L (ref 20–31)
Calcium: 9.5 mg/dL (ref 8.6–10.2)
Chloride: 100 mmol/L (ref 98–110)
Creat: 0.77 mg/dL (ref 0.50–1.10)
GFR, Est African American: >89 mL/min (ref >=60)
GFR, Est Non African American: >89 mL/min (ref >=60)
Glucose, Bld: 128 mg/dL — ABNORMAL HIGH (ref 65–99)
Potassium: 3.8 mmol/L (ref 3.5–5.3)
Sodium: 144 mmol/L (ref 135–146)
Total Bilirubin: 0.4 mg/dL (ref 0.2–1.2)
Total Protein: 6.1 g/dL (ref 6.1–8.1)

## 2016-10-02 LAB — CBC
HCT: 40.8 % (ref 35.0–45.0)
Hemoglobin: 13.4 g/dL (ref 11.7–15.5)
MCH: 29.8 pg (ref 27.0–33.0)
MCHC: 32.8 g/dL (ref 32.0–36.0)
MCV: 90.7 fL (ref 80.0–100.0)
MPV: 10.1 fL (ref 7.5–12.5)
Platelets: 344 10*3/uL (ref 140–400)
RBC: 4.5 MIL/uL (ref 3.80–5.10)
RDW: 13.7 % (ref 11.0–15.0)
WBC: 8 10*3/uL (ref 3.8–10.8)

## 2016-10-03 LAB — TSH: TSH: 0.62 mIU/L

## 2016-10-08 ENCOUNTER — Ambulatory Visit (INDEPENDENT_AMBULATORY_CARE_PROVIDER_SITE_OTHER): Payer: Medicare Other | Admitting: Neurology

## 2016-10-08 ENCOUNTER — Encounter: Payer: Self-pay | Admitting: Neurology

## 2016-10-08 VITALS — BP 104/72 | HR 111 | Resp 16 | Ht 63.0 in | Wt 228.0 lb

## 2016-10-08 DIAGNOSIS — G971 Other reaction to spinal and lumbar puncture: Secondary | ICD-10-CM | POA: Diagnosis not present

## 2016-10-08 NOTE — Patient Instructions (Signed)
   We will do myelogram of the neck and mid-back looking for a spinal fluid leak.

## 2016-10-08 NOTE — Progress Notes (Signed)
Reason for visit: Headaches  Referring physician: Dr. Liston Alba is a 51 y.o. female  History of present illness:  Ms. Morre is a 51 year old right-handed white female with a history of chronic low back pain. The patient has a very complex medical history. She underwent cervical spine surgery on 07/25/2009. The patient has also had lumbosacral spine surgery in January 2007 and a repeat surgery in August 2008. The patient has had chronic low back pain and underwent a spinal stimulator placement for control the pain in December 2016. The patient began noting onset of headaches in September 2017. The patient indicated that the headaches initially would come and go, but about 4 days prior to an evaluation on the 23rd of February 2018, the patient began to note a severe increase in headaches. 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 This seemed to help the headache some, but the headaches have persisted. The patient was placed on Diamox which resulted in some numbness of the face and arms, and the headache increased. The symptoms have improved somewhat coming off of Diamox. The patient is also on Lasix, the dose was recently increased from 20 mg to 40 mg daily. The patient has had evidence of worsening Arnold-Chiari tonsillar herniation with the low pressure of the spinal fluid. The patient reports that the headaches will get better with lying down, when she sits up or stands up, the headaches will come up from the back of the neck and into the back of the head. The patient will have a pressure sensation associated with some nausea. The patient has some pain in the neck area, but no neck stiffness. The patient may have tinnitus, no pulsatile tinnitus is seen or alteration in hearing. The patient has some blurring of vision with the headache. The patient does have some increase in low back  pain and pain down the right leg as well, the right leg is slightly weak at this point. The patient also feels that her memory and concentration have decline. She denies any problems with controlling the bowels or the bladder, she has not had a big change in balance. She denies any double vision, slurred speech, or difficulty swallowing. She has undergone a lumbar myelogram on 08/13/2016, no spinal fluid leak was identified. The patient has undergone a CT scan of the brain in April 2018 which continues show some sagging of the brain. The patient is sent to this office for further evaluation. The patient has had an ophthalmologic evaluation recently, no papilledema was noted.  Past Medical History:  Diagnosis Date  . Anemia   . Arthritis    "back and knees" (07/23/2016)  . Chiari malformation type I ([place]) dx'd 2014  . Chronic back pain    buldging, DDD; "neck and lower back" (07/23/2016)  . Chronic constipation    takes Miralax daily  . Chronic pain syndrome    takes Methadone and Keppra daily  . Depression    takes Effexor daily  . Fibromyalgia   . GERD (gastroesophageal reflux disease)    not taking anything at the present time  . History of DVT of lower extremity 07/2012   LEFT LEG --  3/ 2014.Was on a blood transfusion for 6 months  . History of kidney stones   . History of MRSA infection 10 yrs ago  . Hyperlipidemia    takes Simvastatin daily  .  Migraine    "daily recently" (07/23/2016)  . Muscle spasm    takes Zanaflex nightly  . Peripheral edema    takes Lasix daily as needed  . Peripheral neuropathy   . Pneumonia    hx of-7-8 yrs ago  . PONV (postoperative nausea and vomiting)   . Seasonal allergies    takes Claritin daily  . Spinal headache    "since 2002; still get them; try to manage it w/lying flat"  . Weakness    and numbness in right hand    Past Surgical History:  Procedure Laterality Date  . ANTERIOR CERVICAL DECOMP/DISCECTOMY FUSION  07-25-2009   C5 -- C6  .  APPENDECTOMY  age 63  . BACK SURGERY    . BREAST REDUCTION SURGERY Bilateral 06/07/2013   Procedure: BILATERAL MAMMARY REDUCTION  (BREAST);  Surgeon: Theodoro Kos, DO;  Location: [facility];  Service: Plastics;  Laterality: Bilateral;  . BREAST REDUCTION SURGERY Bilateral 06/07/2013   Procedure:  LIPOSUCTION;  Surgeon: Theodoro Kos, DO;  Location: [facility];  Service: Plastics;  Laterality: Bilateral;  . CARPAL TUNNEL RELEASE Bilateral 2001  . COLONOSCOPY  X 2   "polyps were benign"  . ESOPHAGOGASTRODUODENOSCOPY    . gastric banding undone  11/2014  . LAPAROSCOPIC CHOLECYSTECTOMY  ~ 2000  . LAPAROSCOPIC GASTRIC BANDING  2011  . LUMBAR LAMINECTOMY/DECOMPRESSION MICRODISCECTOMY N/A 07/23/2016   Procedure: Decompressive Lumbar Laminectomy Lumbar two-three  for Repair of  Cerberal Spinal Fluid  Leak;  Surgeon: Kary Kos, MD;  Location: [facility];  Service: Neurosurgery;  Laterality: N/A;  . PLACEMENT LUMBOPERITONEAL SHUNT FOR CEREBROSPINAL FLUID DIVERSION AWAY FROM C8 PERINEURAL CYST  09-05-2003  &  12-24-2003   08-31-2003  REMOVAL SHUNT  . POSTERIOR CERVICAL LAMINECTOMY  03-01-2001   C7-8 and Exploration C8 perineural cyst  . POSTERIOR LUMBAR LAMINECTOMY L4-5/  DISKECTOMY L5-S1  05-20-2005  . RE-DO DECOMPRESSION LAMINECTOMY AND FUSION L4-5  &  L5-S1  12-20-2006   02-04-2007--  RE-EXPLORATION L4 to S1, I & D LUMBAR WOUND HEMATOMA  . REDUCTION MAMMAPLASTY    . SHUNT REMOVAL N/A 07/10/2016   Procedure: SHUNT REMOVAL;  Surgeon: Kary Kos, MD;  Location: [facility];  Service: Neurosurgery;  Laterality: N/A;  SHUNT REMOVAL  . SPINAL CORD STIMULATOR INSERTION N/A 04/26/2015   Procedure: LUMBAR SPINAL CORD STIMULATOR INSERTION;  Surgeon: Clydell Hakim, MD;  Location: [facility];  Service: Neurosurgery;  Laterality: N/A;  LUMBAR SPINAL CORD STIMULATOR INSERTION  . TENNIS ELBOW RELEASE/NIRSCHEL PROCEDURE Left 12/13/2014   Procedure: LEFT ELBOW LATERAL EPICONDYLAR DEBRIDEMENT AND OR  REPAIR -RECONSTRUCTION ;  Surgeon: Iran Planas, MD;  Location: [facility];  Service: Orthopedics;  Laterality: Left;  . TONSILLECTOMY    . TOTAL ABDOMINAL HYSTERECTOMY  1994   w/  Bilateral Salpinoophorectomy    Family History  Problem Relation Age of Onset  . Depression Unknown   . Colon cancer Father   . Ovarian cancer Maternal Grandmother   . Pancreatic cancer Maternal Grandfather     Social history:  reports that she has never smoked. She has never used smokeless tobacco. She reports that she does not drink alcohol or use drugs.  Medications:  Prior to Admission medications   Medication Sig Start Date End Date Taking? Authorizing Provider  DULoxetine (CYMBALTA) 60 MG capsule Take 2 capsules (120 mg total) by mouth daily. 07/01/16  Yes Gregor Hams, MD  furosemide (LASIX) 40 MG tablet Take 1 tablet (40 mg total) by  mouth 2 (two) times daily. 10/01/16  Yes Gregor Hams, MD  levETIRAcetam (KEPPRA) 500 MG tablet Take 500 mg by mouth 2 (two) times daily.    Yes [provider]  liraglutide (VICTOZA) 18 MG/3ML SOPN Started 0.6 mg injected subcutaneously daily for one week, then increase by 0.6 mg weekly until reaching 1.8 mg injected daily.  Please include needles. 10/01/16  Yes Gregor Hams, MD  methadone (DOLOPHINE) 5 MG tablet Take 2.5 mg by mouth every morning.  06/23/15  Yes [provider]  montelukast (SINGULAIR) 10 MG tablet TAKE 1 TABLET (10 MG TOTAL) BY MOUTH AT BEDTIME. Patient taking differently: Take 10 mg by mouth every morning.  04/27/16  Yes Gregor Hams, MD  ondansetron (ZOFRAN) 4 MG tablet Take 1 tablet (4 mg total) by mouth every 6 (six) hours. 09/10/16  Yes Noland Fordyce, PA-C  oxyCODONE (OXY IR/ROXICODONE) 5 MG immediate release tablet Take 1-2 tablets (5-10 mg total) by mouth every 6 (six) hours as needed for breakthrough pain. 07/30/16  Yes Eustace Moore, MD  pramipexole (MIRAPEX) 0.125 MG tablet Take 3 tablets (0.375 mg total) by  mouth at bedtime. 09/22/16  Yes Gregor Hams, MD  simvastatin (ZOCOR) 20 MG tablet Take 1 tablet (20 mg total) by mouth daily. Due for lab work 09/21/16  Yes Gregor Hams, MD  tiZANidine (ZANAFLEX) 4 MG capsule Take 4-8 mg by mouth 2 (two) times daily. ONE TAB AM - PRN AND 2 TABS AT BEDTIME   Yes [provider]  traMADol (ULTRAM) 50 MG tablet Take 50 mg by mouth at bedtime.    Yes [provider]      Allergies  Allergen Reactions  . Penicillins Anaphylaxis    Has patient had a PCN reaction causing immediate rash, facial/tongue/throat swelling, SOB or lightheadedness with hypotension: Yes Has patient had a PCN reaction causing severe rash involving mucus membranes or skin necrosis: No Has patient had a PCN reaction that required hospitalization No Has patient had a PCN reaction occurring within the last 10 years: No If all of the above answers are "NO", then may proceed with Cephalosporin use.   . Chlorhexidine Other (See Comments)    Dermatitis - Allergic  . Lactose Swelling and Rash  . Other Swelling and Rash    Silicone Tape  . Tape Rash    Plastic tape    ROS:  Out of a complete 14 system review of symptoms, the patient complains only of the following symptoms, and all other reviewed systems are negative.  Fatigue Swelling in the legs Blurred vision Feeling hot, increased thirst Memory loss  Blood pressure 104/72, pulse (!) 111, resp. rate 16, height 5\' 3"  (1.6 m), weight 228 lb (103.4 kg).  Physical Exam  General: The patient is alert and cooperative at the time of the examination. The patient is moderately to markedly obese.  Eyes: Pupils are equal, round, and reactive to light. Discs are flat bilaterally.  Neck: The neck is supple, no carotid bruits are noted.  Respiratory: The respiratory examination is clear.  Cardiovascular: The cardiovascular examination reveals a regular rate and rhythm, no obvious murmurs or rubs are noted.  Skin:  Extremities are with 1+ edema below the knees bilaterally.  Neurologic Exam  Mental status: The patient is alert and oriented x 3 at the time of the examination. The patient has apparent normal recent and remote memory, with an apparently normal attention span and concentration ability.  Cranial nerves: Facial  symmetry is present. There is good sensation of the face to pinprick and soft touch bilaterally. The strength of the facial muscles and the muscles to head turning and shoulder shrug are normal bilaterally. Speech is well enunciated, no aphasia or dysarthria is noted. Extraocular movements are full. Visual fields are full. The tongue is midline, and the patient has symmetric elevation of the soft palate. No obvious hearing deficits are noted.  Motor: The motor testing reveals 5 over 5 strength of all 4 extremities. Good symmetric motor tone is noted throughout.  Sensory: Sensory testing is intact to pinprick, soft touch, vibration sensation, and position sense on all 4 extremities. No evidence of extinction is noted.  Coordination: Cerebellar testing reveals good finger-nose-finger and heel-to-shin bilaterally.  Gait and station: Gait is normal. Tandem gait is normal. Romberg is negative. No drift is seen.  Reflexes: Deep tendon reflexes are symmetric and normal bilaterally, with exception of a relative decrease in the ankle jerk reflex on the right as compared to the left. Toes are downgoing bilaterally.    Assessment/Plan:  1. Low spinal fluid pressure headache  2. Arnold-Chiari malformation  3. Chronic low back pain  The patient has very complex history. She has had cervical spine surgery and prior lumbosacral spine surgery, she has a spinal stimulator in place. The patient has had a recent myelogram that did not show a lumbar source of spinal fluid leak, but a complete myelogram may be required to fully exclude a spinal leak higher up. If the patient does have a spinal leak, she  will continue to have headaches until this is corrected. The patient be set up for myelogram of the cervical and thoracic area. I will contact her when I have the results of the study. I have suggested that a reduction in the Lasix dose or getting off of Lasix may be helpful as this will also reduce spinal fluid pressure.  Jill Alexanders MD 10/08/2016 8:10 AM  [facility] [address] [place], [place] [address]  Phone [phone] Fax [phone]

## 2016-10-17 NOTE — Addendum Note (Signed)
Addendum  created 10/17/16 1026 by Duane Boston, MD   Sign clinical note

## 2016-10-21 ENCOUNTER — Telehealth: Payer: Self-pay | Admitting: Neurology

## 2016-10-21 ENCOUNTER — Ambulatory Visit
Admission: RE | Admit: 2016-10-21 | Discharge: 2016-10-21 | Disposition: A | Discharge status: Home / Self Care | Payer: Medicare Other | Source: Ambulatory Visit | Attending: Neurology | Admitting: Neurology

## 2016-10-21 VITALS — BP 133/73 | HR 80

## 2016-10-21 DIAGNOSIS — G96810 Intracranial hypotension, unspecified: Secondary | ICD-10-CM

## 2016-10-21 DIAGNOSIS — G8929 Other chronic pain: Secondary | ICD-10-CM | POA: Insufficient documentation

## 2016-10-21 DIAGNOSIS — R51 Headache: Secondary | ICD-10-CM

## 2016-10-21 DIAGNOSIS — G971 Other reaction to spinal and lumbar puncture: Secondary | ICD-10-CM

## 2016-10-21 DIAGNOSIS — R519 Headache, unspecified: Secondary | ICD-10-CM | POA: Insufficient documentation

## 2016-10-21 DIAGNOSIS — M542 Cervicalgia: Secondary | ICD-10-CM

## 2016-10-21 DIAGNOSIS — G9389 Other specified disorders of brain: Principal | ICD-10-CM

## 2016-10-21 MED ORDER — HYDROMORPHONE HCL 2 MG/ML IJ SOLN
2.0000 mg | Freq: Once | INTRAMUSCULAR | Status: AC
  Administered 2016-10-21: 2 mg via INTRAMUSCULAR

## 2016-10-21 MED ORDER — DIAZEPAM 5 MG PO TABS
10.0000 mg | ORAL_TABLET | Freq: Once | ORAL | Status: AC
  Administered 2016-10-21: 10 mg via ORAL

## 2016-10-21 MED ORDER — IOPAMIDOL (ISOVUE-M 300) INJECTION 61%
10.0000 mL | Freq: Once | INTRAMUSCULAR | Status: AC | PRN
  Administered 2016-10-21: 10 mL via INTRATHECAL

## 2016-10-21 MED ORDER — HYDROXYZINE HCL 50 MG/ML IM SOLN
25.0000 mg | Freq: Once | INTRAMUSCULAR | Status: AC
  Administered 2016-10-21: 25 mg via INTRAMUSCULAR

## 2016-10-21 NOTE — Discharge Instructions (Signed)
Myelogram Discharge Instructions  1. Go home and rest quietly for the next 24 hours.  It is important to lie flat for the next 24 hours.  Get up only to go to the restroom.  You may lie in the bed or on a couch on your back, your stomach, your left side or your right side.  You may have one pillow under your head.  You may have pillows between your knees while you are on your side or under your knees while you are on your back.  2. DO NOT drive today.  Recline the seat as far back as it will go, while still wearing your seat belt, on the way home.  3. You may get up to go to the bathroom as needed.  You may sit up for 10 minutes to eat.  You may resume your normal diet and medications unless otherwise indicated.  Drink lots of extra fluids today and tomorrow.  4. The incidence of headache, nausea, or vomiting is about 5% (one in 20 patients).  If you develop a headache, lie flat and drink plenty of fluids until the headache goes away.  Caffeinated beverages may be helpful.  If you develop severe nausea and vomiting or a headache that does not go away with flat bed rest, call [phone].  5. You may resume normal activities after your 24 hours of bed rest is over; however, do not exert yourself strongly or do any heavy lifting tomorrow. If when you get up you have a headache when standing, go back to bed and force fluids for another 24 hours.  6. Call your physician for a follow-up appointment.  The results of your myelogram will be sent directly to your physician by the following day.  7. If you have any questions or if complications develop after you arrive home, please call [phone].  Discharge instructions have been explained to the patient.  The patient, or the person responsible for the patient, fully understands these instructions.       May resume Cymbalta and Tramadol on October 22, 2016, after 9:30 am.

## 2016-10-21 NOTE — Telephone Encounter (Signed)
I called patient. The myelogram and CT to follow did not show evidence of extrusion of spinal fluid, this is not unusual with a small spinal fluid leak. The next step will be to initiate a blood patch procedure, we may repeat this another time or 2 to see if this helps.  Cervical and lumbar myelogram with CT to follow 10/21/16:  IMPRESSION: 1. Stable appearance posterior dysraphism, conjoined nerve roots, and perineural root sleeve cysts on the right involving the C7 and C8 nerve roots. 2. No other significant CSF collection in the cervical spine. 3. Perineural root sleeve cysts as detailed above throughout the thoracic spine. T1-2, T9-10, and T10-11 are accepted. Perineural root sleeve cysts are present at all other levels of the thoracic spine and at L1-2. Although these cysts are commonly benign, they can represent sites of potential CSF leak in the setting of chronic intracranial hypotension. 4. Small left paramedian disc protrusion at T7-8 without significant stenosis. 5. Dorsal spinal cord stimulator extends to the T8 level.

## 2016-10-21 NOTE — Progress Notes (Signed)
Patient states she has been off Cymbalta and Tramadol for at least the past two days.  Brita Romp, RN

## 2016-10-27 ENCOUNTER — Other Ambulatory Visit: Payer: Self-pay | Admitting: Neurology

## 2016-10-27 DIAGNOSIS — G96810 Intracranial hypotension, unspecified: Secondary | ICD-10-CM

## 2016-10-27 DIAGNOSIS — G9389 Other specified disorders of brain: Principal | ICD-10-CM

## 2016-10-30 ENCOUNTER — Ambulatory Visit: Payer: Medicare Other | Admitting: Family Medicine

## 2016-11-27 ENCOUNTER — Other Ambulatory Visit: Payer: Self-pay | Admitting: Family Medicine

## 2016-11-28 ENCOUNTER — Other Ambulatory Visit: Payer: Self-pay | Admitting: Family Medicine

## 2016-12-25 ENCOUNTER — Other Ambulatory Visit: Payer: Self-pay | Admitting: Family Medicine

## 2016-12-28 ENCOUNTER — Other Ambulatory Visit: Payer: Self-pay

## 2016-12-28 DIAGNOSIS — E782 Mixed hyperlipidemia: Secondary | ICD-10-CM

## 2016-12-28 NOTE — Telephone Encounter (Signed)
Patient advised and order sent.  

## 2016-12-28 NOTE — Telephone Encounter (Signed)
Pt needs to come in for lipid panel

## 2016-12-30 ENCOUNTER — Encounter: Payer: Self-pay | Admitting: Family Medicine

## 2016-12-30 MED ORDER — PRAMIPEXOLE DIHYDROCHLORIDE 0.125 MG PO TABS: 0.3750 mg | ORAL_TABLET | Freq: Every day | ORAL | 0 refills | Status: DC

## 2017-01-07 ENCOUNTER — Other Ambulatory Visit (HOSPITAL_BASED_OUTPATIENT_CLINIC_OR_DEPARTMENT_OTHER): Payer: Self-pay | Admitting: Neurosurgery

## 2017-01-07 DIAGNOSIS — M5440 Lumbago with sciatica, unspecified side: Secondary | ICD-10-CM

## 2017-01-08 ENCOUNTER — Ambulatory Visit (HOSPITAL_BASED_OUTPATIENT_CLINIC_OR_DEPARTMENT_OTHER)
Admission: RE | Admit: 2017-01-08 | Discharge: 2017-01-08 | Disposition: A | Discharge status: Home / Self Care | Payer: Medicare Other | Source: Ambulatory Visit | Attending: Neurosurgery | Admitting: Neurosurgery

## 2017-01-08 ENCOUNTER — Other Ambulatory Visit (HOSPITAL_BASED_OUTPATIENT_CLINIC_OR_DEPARTMENT_OTHER): Payer: Self-pay | Admitting: Neurosurgery

## 2017-01-08 DIAGNOSIS — M5440 Lumbago with sciatica, unspecified side: Secondary | ICD-10-CM

## 2017-01-08 DIAGNOSIS — N200 Calculus of kidney: Secondary | ICD-10-CM | POA: Insufficient documentation

## 2017-01-08 DIAGNOSIS — Z981 Arthrodesis status: Secondary | ICD-10-CM | POA: Insufficient documentation

## 2017-01-09 LAB — LIPID PANEL W/REFLEX DIRECT LDL
Cholesterol: 175 mg/dL (ref <200)
HDL: 57 mg/dL (ref >50)
LDL-Cholesterol: 90 mg/dL
Non-HDL Cholesterol (Calc): 118 mg/dL (ref <130)
Total CHOL/HDL Ratio: 3.1 Ratio (ref <5.0)
Triglycerides: 181 mg/dL — ABNORMAL HIGH (ref <150)

## 2017-01-14 ENCOUNTER — Other Ambulatory Visit: Payer: Self-pay | Admitting: Family Medicine

## 2017-02-12 ENCOUNTER — Other Ambulatory Visit: Payer: Self-pay | Admitting: Family Medicine

## 2017-02-15 ENCOUNTER — Other Ambulatory Visit: Payer: Self-pay | Admitting: Family Medicine

## 2017-03-02 ENCOUNTER — Encounter: Payer: Self-pay | Admitting: Family Medicine

## 2017-03-03 MED ORDER — DULOXETINE HCL 60 MG PO CPEP: 120.0000 mg | ORAL_CAPSULE | Freq: Every day | ORAL | 3 refills | Status: DC

## 2017-03-24 ENCOUNTER — Other Ambulatory Visit: Payer: Self-pay | Admitting: Family Medicine

## 2017-03-26 ENCOUNTER — Encounter: Payer: Self-pay | Admitting: Family Medicine

## 2017-03-26 MED ORDER — PRAMIPEXOLE DIHYDROCHLORIDE 0.125 MG PO TABS: 0.3750 mg | ORAL_TABLET | Freq: Every day | ORAL | 0 refills | Status: DC

## 2017-03-27 ENCOUNTER — Other Ambulatory Visit: Payer: Self-pay | Admitting: Family Medicine

## 2017-03-31 ENCOUNTER — Ambulatory Visit (INDEPENDENT_AMBULATORY_CARE_PROVIDER_SITE_OTHER): Payer: Medicare Other | Admitting: Family Medicine

## 2017-03-31 VITALS — BP 108/39 | HR 103 | Temp 98.2°F | Ht 63.0 in | Wt 205.0 lb

## 2017-03-31 DIAGNOSIS — R7303 Prediabetes: Secondary | ICD-10-CM | POA: Diagnosis not present

## 2017-03-31 DIAGNOSIS — R51 Headache: Secondary | ICD-10-CM

## 2017-03-31 DIAGNOSIS — G935 Compression of brain: Secondary | ICD-10-CM | POA: Diagnosis not present

## 2017-03-31 DIAGNOSIS — F3341 Major depressive disorder, recurrent, in partial remission: Secondary | ICD-10-CM

## 2017-03-31 DIAGNOSIS — R519 Headache, unspecified: Secondary | ICD-10-CM

## 2017-03-31 DIAGNOSIS — E782 Mixed hyperlipidemia: Secondary | ICD-10-CM

## 2017-03-31 DIAGNOSIS — G932 Benign intracranial hypertension: Secondary | ICD-10-CM | POA: Diagnosis not present

## 2017-03-31 DIAGNOSIS — G8929 Other chronic pain: Secondary | ICD-10-CM

## 2017-03-31 LAB — COMPLETE METABOLIC PANEL WITH GFR
AG Ratio: 1.9 (calc) (ref 1.0–2.5)
ALT: 10 U/L (ref 6–29)
AST: 20 U/L (ref 10–35)
Albumin: 4.7 g/dL (ref 3.6–5.1)
Alkaline phosphatase (APISO): 144 U/L — ABNORMAL HIGH (ref 33–115)
BUN: 13 mg/dL (ref 7–25)
CO2: 29 mmol/L (ref 20–32)
Calcium: 10 mg/dL (ref 8.6–10.2)
Chloride: 103 mmol/L (ref 98–110)
Creat: 0.75 mg/dL (ref 0.50–1.10)
GFR, Est African American: 108 mL/min/{1.73_m2} (ref >=60)
GFR, Est Non African American: 94 mL/min/{1.73_m2} (ref >=60)
Globulin: 2.5 g/dL (calc) (ref 1.9–3.7)
Glucose, Bld: 97 mg/dL (ref 65–99)
Potassium: 4.4 mmol/L (ref 3.5–5.3)
Sodium: 143 mmol/L (ref 135–146)
Total Bilirubin: 0.4 mg/dL (ref 0.2–1.2)
Total Protein: 7.2 g/dL (ref 6.1–8.1)

## 2017-03-31 MED ORDER — SIMVASTATIN 20 MG PO TABS: ORAL_TABLET | ORAL | 3 refills | Status: DC

## 2017-03-31 MED ORDER — DULOXETINE HCL 60 MG PO CPEP: 120.0000 mg | ORAL_CAPSULE | Freq: Every day | ORAL | 3 refills | Status: DC

## 2017-03-31 MED ORDER — PRAMIPEXOLE DIHYDROCHLORIDE 0.125 MG PO TABS: 0.3750 mg | ORAL_TABLET | Freq: Every day | ORAL | 3 refills | Status: DC

## 2017-03-31 NOTE — Progress Notes (Signed)
Katrina Fernandez is a 51 y.o. female who presents to [facility] today for follow-up restless leg syndrome, and hyperlipidemia and discuss new diagnosis of pseudotumor cerebri.  Katrina Fernandez was recently diagnosed with pseudotumor cerebri.  This is thought to be due to her Chiari malformation.  She has been tried on acetazolamide and subsequently on methazolamide.  This has not helped much and she is currently in the process of being worked up for her shunt.  As for her anxiety and depression this seems to be pretty well controlled with duloxetine.  She is pretty satisfied with how things are going and notes that she has life stressors but they are pretty well controlled.  Hyperlipidemia seems to be pretty well controlled with the statin listed below.  She denies significant muscle aches or pains and notes that her lipids were well controlled when checked earlier this year.   Past Medical History:  Diagnosis Date  . Anemia   . Arthritis    "back and knees" (07/23/2016)  . Chiari malformation type I ([place]) dx'd 2014  . Chronic back pain    buldging, DDD; "neck and lower back" (07/23/2016)  . Chronic constipation    takes Miralax daily  . Chronic pain syndrome    takes Methadone and Keppra daily  . Depression    takes Effexor daily  . Fibromyalgia   . GERD (gastroesophageal reflux disease)    not taking anything at the present time  . History of DVT of lower extremity 07/2012   LEFT LEG --  3/ 2014.Was on a blood transfusion for 6 months  . History of kidney stones   . History of MRSA infection 10 yrs ago  . Hyperlipidemia    takes Simvastatin daily  . Migraine    "daily recently" (07/23/2016)  . Muscle spasm    takes Zanaflex nightly  . Peripheral edema    takes Lasix daily as needed  . Peripheral neuropathy   . Pneumonia    hx of-7-8 yrs ago  . PONV  (postoperative nausea and vomiting)   . Seasonal allergies    takes Claritin daily  . Spinal headache    "since 2002; still get them; try to manage it w/lying flat"  . Weakness    and numbness in right hand   Past Surgical History:  Procedure Laterality Date  . ANTERIOR CERVICAL DECOMP/DISCECTOMY FUSION  07-25-2009   C5 -- C6  . APPENDECTOMY  age 48  . BACK SURGERY    . CARPAL TUNNEL RELEASE Bilateral 2001  . COLONOSCOPY  X 2   "polyps were benign"  . ESOPHAGOGASTRODUODENOSCOPY    . gastric banding undone  11/2014  . LAPAROSCOPIC CHOLECYSTECTOMY  ~ 2000  . LAPAROSCOPIC GASTRIC BANDING  2011  . PLACEMENT LUMBOPERITONEAL SHUNT FOR CEREBROSPINAL FLUID DIVERSION AWAY FROM C8 PERINEURAL CYST  09-05-2003  &  12-24-2003   08-31-2003  REMOVAL SHUNT  . POSTERIOR CERVICAL LAMINECTOMY  03-01-2001   C7-8 and Exploration C8 perineural cyst  . POSTERIOR LUMBAR LAMINECTOMY L4-5/  DISKECTOMY L5-S1  05-20-2005  . RE-DO DECOMPRESSION LAMINECTOMY AND FUSION L4-5  &  L5-S1  12-20-2006   02-04-2007--  RE-EXPLORATION L4 to S1, I & D LUMBAR WOUND HEMATOMA  . REDUCTION MAMMAPLASTY    . TONSILLECTOMY    . TOTAL ABDOMINAL HYSTERECTOMY  1994   w/  Bilateral Salpinoophorectomy   Social History   Tobacco Use  . Smoking status: Never Smoker  .  Smokeless tobacco: Never Used  Substance Use Topics  . Alcohol use: No   family history includes Colon cancer in her father; Depression in her unknown relative; Ovarian cancer in her maternal grandmother; Pancreatic cancer in her maternal grandfather.  ROS as above:  Medications: Current Outpatient Medications  Medication Sig Dispense Refill  . potassium chloride (K-DUR) 10 MEQ tablet Take by mouth.    . DULoxetine (CYMBALTA) 60 MG capsule Take 2 capsules (120 mg total) daily by mouth. 60 capsule 3  . furosemide (LASIX) 40 MG tablet TAKE 1 TABLET BY MOUTH TWICE A DAY 60 tablet 1  . levETIRAcetam (KEPPRA) 500 MG tablet Take 500 mg by mouth 2 (two) times  daily.     Marland Kitchen liraglutide (VICTOZA) 18 MG/3ML SOPN Started 0.6 mg injected subcutaneously daily for one week, then increase by 0.6 mg weekly until reaching 1.8 mg injected daily.  Please include needles. 9 mL 3  . methadone (DOLOPHINE) 5 MG tablet Take 2.5 mg by mouth every morning.   0  . methazolamide (NEPTAZANE) 25 MG tablet Take 25 mg 3 (three) times daily by mouth.  0  . montelukast (SINGULAIR) 10 MG tablet TAKE 1 TABLET (10 MG TOTAL) BY MOUTH AT BEDTIME. (Patient taking differently: Take 10 mg by mouth every morning. ) 30 tablet 11  . pramipexole (MIRAPEX) 0.125 MG tablet Take 3 tablets (0.375 mg total) at bedtime by mouth. 90 tablet 3  . simvastatin (ZOCOR) 20 MG tablet TAKE 1 TABLET BY MOUTH  DAILY. FOLLOW UP LABS 30 tablet 3  . tiZANidine (ZANAFLEX) 4 MG capsule Take 4-8 mg by mouth 2 (two) times daily. ONE TAB AM - PRN AND 2 TABS AT BEDTIME    . traMADol (ULTRAM) 50 MG tablet Take 50 mg by mouth at bedtime.      No current facility-administered medications for this visit.    Allergies  Allergen Reactions  . Penicillins Anaphylaxis    Has patient had a PCN reaction causing immediate rash, facial/tongue/throat swelling, SOB or lightheadedness with hypotension: Yes Has patient had a PCN reaction causing severe rash involving mucus membranes or skin necrosis: No Has patient had a PCN reaction that required hospitalization No Has patient had a PCN reaction occurring within the last 10 years: No If all of the above answers are "NO", then may proceed with Cephalosporin use.   . Chlorhexidine Other (See Comments)    Dermatitis - Allergic  . Lactose Swelling and Rash  . Tape Rash    Plastic tape, silicone tape    Health Maintenance Health Maintenance  Topic Date Due  . TETANUS/TDAP  05/19/2019  . INFLUENZA VACCINE  Completed  . HIV Screening  Completed     Exam:  BP (!) 108/39   Pulse (!) 103   Temp 98.2 F (36.8 C) (Oral)   Ht 5\' 3"  (1.6 m)   Wt 205 lb (93 kg)   BMI 36.31  kg/m   Wt Readings from Last 5 Encounters:  03/31/17 205 lb (93 kg)  10/08/16 228 lb (103.4 kg)  10/01/16 227 lb (103 kg)  09/10/16 224 lb (101.6 kg)  09/03/16 224 lb (101.6 kg)    Gen: Well NAD HEENT: EOMI,  MMM Lungs: Normal work of breathing. CTABL Heart: RRR no MRG Abd: NABS, Soft. Nondistended, Nontender Exts: Brisk capillary refill, warm and well perfused.  Psych alert and oriented normal speech thought process and no SI or HI expressed.   No results found for this or any previous visit (  from the past 72 hour(s)). No results found.    Assessment and Plan: 51 y.o. female with  Pseudotumor cerebri doing pretty well but not ideally controlled.  I agree his symptoms are not controlled with medications that a shunt probably is indicated.  Will check metabolic panel to check for potassium on the combination of potassium sparing diuretics and potassium wasting diuretics that she is already on.   Anxiety depression doing well continue current management.  Restless leg syndrome doing well continue current management.  Lipids doing well continue current management.   Orders Placed This Encounter  Procedures  . COMPLETE METABOLIC PANEL WITH GFR   Meds ordered this encounter  Medications  . methazolamide (NEPTAZANE) 25 MG tablet    Sig: Take 25 mg 3 (three) times daily by mouth.    Refill:  0  . potassium chloride (K-DUR) 10 MEQ tablet    Sig: Take by mouth.  . pramipexole (MIRAPEX) 0.125 MG tablet    Sig: Take 3 tablets (0.375 mg total) at bedtime by mouth.    Dispense:  90 tablet    Refill:  3  . simvastatin (ZOCOR) 20 MG tablet    Sig: TAKE 1 TABLET BY MOUTH  DAILY. FOLLOW UP LABS    Dispense:  30 tablet    Refill:  3  . DULoxetine (CYMBALTA) 60 MG capsule    Sig: Take 2 capsules (120 mg total) daily by mouth.    Dispense:  60 capsule    Refill:  3     Discussed warning signs or symptoms. Please see discharge instructions. Patient expresses  understanding.  I spent 25 minutes with this patient, greater than 50% was face-to-face time counseling regarding ddx and treatment plan.

## 2017-03-31 NOTE — Patient Instructions (Addendum)
Thank you for coming in today. Continue current meds.  We will reconsider victoza in January based on formulary info.  Get labs today.  Recheck in 5 months or sooner if needed.  We will reassess in the new year.  Please have Neurosurgery send me notes when you see them.

## 2017-04-15 ENCOUNTER — Other Ambulatory Visit: Payer: Self-pay | Admitting: Neurosurgery

## 2017-04-15 DIAGNOSIS — G9600 Cerebrospinal fluid leak, unspecified: Secondary | ICD-10-CM

## 2017-04-23 ENCOUNTER — Ambulatory Visit
Admission: RE | Admit: 2017-04-23 | Discharge: 2017-04-23 | Disposition: A | Discharge status: Home / Self Care | Payer: Medicare Other | Source: Ambulatory Visit | Attending: Neurosurgery | Admitting: Neurosurgery

## 2017-04-23 DIAGNOSIS — G9600 Cerebrospinal fluid leak, unspecified: Secondary | ICD-10-CM

## 2017-05-03 ENCOUNTER — Other Ambulatory Visit: Payer: Self-pay | Admitting: Family Medicine

## 2017-05-21 ENCOUNTER — Ambulatory Visit: Payer: Self-pay | Admitting: Surgery

## 2017-05-21 ENCOUNTER — Ambulatory Visit (INDEPENDENT_AMBULATORY_CARE_PROVIDER_SITE_OTHER): Payer: Medicare HMO | Admitting: Family Medicine

## 2017-05-21 ENCOUNTER — Encounter: Payer: Self-pay | Admitting: Family Medicine

## 2017-05-21 VITALS — BP 119/83 | HR 97 | Wt 209.0 lb

## 2017-05-21 DIAGNOSIS — R635 Abnormal weight gain: Secondary | ICD-10-CM | POA: Diagnosis not present

## 2017-05-21 DIAGNOSIS — M961 Postlaminectomy syndrome, not elsewhere classified: Secondary | ICD-10-CM | POA: Diagnosis not present

## 2017-05-21 DIAGNOSIS — G935 Compression of brain: Secondary | ICD-10-CM

## 2017-05-21 DIAGNOSIS — M542 Cervicalgia: Secondary | ICD-10-CM | POA: Diagnosis not present

## 2017-05-21 DIAGNOSIS — M79609 Pain in unspecified limb: Secondary | ICD-10-CM | POA: Diagnosis not present

## 2017-05-21 DIAGNOSIS — F411 Generalized anxiety disorder: Secondary | ICD-10-CM | POA: Diagnosis not present

## 2017-05-21 DIAGNOSIS — G932 Benign intracranial hypertension: Secondary | ICD-10-CM

## 2017-05-21 DIAGNOSIS — Z79899 Other long term (current) drug therapy: Secondary | ICD-10-CM | POA: Diagnosis not present

## 2017-05-21 DIAGNOSIS — M7061 Trochanteric bursitis, right hip: Secondary | ICD-10-CM | POA: Diagnosis not present

## 2017-05-21 DIAGNOSIS — R7303 Prediabetes: Secondary | ICD-10-CM

## 2017-05-21 DIAGNOSIS — Z5181 Encounter for therapeutic drug level monitoring: Secondary | ICD-10-CM | POA: Diagnosis not present

## 2017-05-21 DIAGNOSIS — R69 Illness, unspecified: Secondary | ICD-10-CM | POA: Diagnosis not present

## 2017-05-21 MED ORDER — SEMAGLUTIDE(0.25 OR 0.5MG/DOS) 2 MG/1.5ML ~~LOC~~ SOPN: PEN_INJECTOR | SUBCUTANEOUS | 1 refills | Status: AC

## 2017-05-21 NOTE — Progress Notes (Signed)
Katrina Fernandez is a 52 y.o. female who presents to [facility] [place] today for prediabetes, anxiety and depression.   Prediabetes: Jemila has prediabetes and previously was taking Victoza for both the prediabetes control as well as helping to manage weight.  She would like to restart either Victoza or similar member of this class of medication.  Anxiety/depression: Offie has a history of anxiety and depression which previously was well controlled with Cymbalta.  She notes increased stress with her life.  Her daughter has neurofibromatosis and is currently undergoing chemotherapy treatment and is losing her vision.  She additionally has some strife with her husband and with her mother.  In the past she is done well with counseling or therapy and would like to restart if possible.  Pseudotumor cerebri: Verena has pseudotumor cerebri and is currently in the process of scheduling surgery for a shunt after failing conservative management.   Past Medical History:  Diagnosis Date  . Anemia   . Arthritis    "back and knees" (07/23/2016)  . Chiari malformation type I ([facility]) dx'd 2014  . Chronic back pain    buldging, DDD; "neck and lower back" (07/23/2016)  . Chronic constipation    takes Miralax daily  . Chronic pain syndrome    takes Methadone and Keppra daily  . Depression    takes Effexor daily  . Fibromyalgia   . GERD (gastroesophageal reflux disease)    not taking anything at the present time  . History of DVT of lower extremity 07/2012   LEFT LEG --  3/ 2014.Was on a blood transfusion for 6 months  . History of kidney stones   . History of MRSA infection 10 yrs ago  . Hyperlipidemia    takes Simvastatin daily  . Migraine    "daily recently" (07/23/2016)  . Muscle spasm    takes Zanaflex nightly  . Peripheral edema    takes Lasix daily as needed  . Peripheral  neuropathy   . Pneumonia    hx of-7-8 yrs ago  . PONV (postoperative nausea and vomiting)   . Seasonal allergies    takes Claritin daily  . Spinal headache    "since 2002; still get them; try to manage it w/lying flat"  . Weakness    and numbness in right hand   Past Surgical History:  Procedure Laterality Date  . ANTERIOR CERVICAL DECOMP/DISCECTOMY FUSION  07-25-2009   C5 -- C6  . APPENDECTOMY  age 31  . BACK SURGERY    . BREAST REDUCTION SURGERY Bilateral 06/07/2013   Procedure: BILATERAL MAMMARY REDUCTION  (BREAST);  Surgeon: Theodoro Kos, DO;  Location: [facility];  Service: Plastics;  Laterality: Bilateral;  . BREAST REDUCTION SURGERY Bilateral 06/07/2013   Procedure:  LIPOSUCTION;  Surgeon: Theodoro Kos, DO;  Location: [facility];  Service: Plastics;  Laterality: Bilateral;  . CARPAL TUNNEL RELEASE Bilateral 2001  . COLONOSCOPY  X 2   "polyps were benign"  . ESOPHAGOGASTRODUODENOSCOPY    . gastric banding undone  11/2014  . LAPAROSCOPIC CHOLECYSTECTOMY  ~ 2000  . LAPAROSCOPIC GASTRIC BANDING  2011  . LUMBAR LAMINECTOMY/DECOMPRESSION MICRODISCECTOMY N/A 07/23/2016   Procedure: Decompressive Lumbar Laminectomy Lumbar two-three  for Repair of  Cerberal Spinal Fluid  Leak;  Surgeon: Kary Kos, MD;  Location: [facility];  Service: Neurosurgery;  Laterality: N/A;  . PLACEMENT LUMBOPERITONEAL SHUNT FOR CEREBROSPINAL FLUID DIVERSION AWAY FROM C8 PERINEURAL CYST  09-05-2003  &  12-24-2003   08-31-2003  REMOVAL SHUNT  . POSTERIOR CERVICAL LAMINECTOMY  03-01-2001   C7-8 and Exploration C8 perineural cyst  . POSTERIOR LUMBAR LAMINECTOMY L4-5/  DISKECTOMY L5-S1  05-20-2005  . RE-DO DECOMPRESSION LAMINECTOMY AND FUSION L4-5  &  L5-S1  12-20-2006   02-04-2007--  RE-EXPLORATION L4 to S1, I & D LUMBAR WOUND HEMATOMA  . REDUCTION MAMMAPLASTY    . SHUNT REMOVAL N/A 07/10/2016   Procedure: SHUNT REMOVAL;  Surgeon: Kary Kos, MD;  Location: [facility];  Service: Neurosurgery;   Laterality: N/A;  SHUNT REMOVAL  . SPINAL CORD STIMULATOR INSERTION N/A 04/26/2015   Procedure: LUMBAR SPINAL CORD STIMULATOR INSERTION;  Surgeon: Clydell Hakim, MD;  Location: [facility];  Service: Neurosurgery;  Laterality: N/A;  LUMBAR SPINAL CORD STIMULATOR INSERTION  . TENNIS ELBOW RELEASE/NIRSCHEL PROCEDURE Left 12/13/2014   Procedure: LEFT ELBOW LATERAL EPICONDYLAR DEBRIDEMENT AND OR REPAIR -RECONSTRUCTION ;  Surgeon: Iran Planas, MD;  Location: [facility];  Service: Orthopedics;  Laterality: Left;  . TONSILLECTOMY    . TOTAL ABDOMINAL HYSTERECTOMY  1994   w/  Bilateral Salpinoophorectomy   Social History   Tobacco Use  . Smoking status: Never Smoker  . Smokeless tobacco: Never Used  Substance Use Topics  . Alcohol use: No   family history includes Colon cancer in her father; Depression in her unknown relative; Ovarian cancer in her maternal grandmother; Pancreatic cancer in her maternal grandfather.  ROS as above:  Medications: Current Outpatient Medications  Medication Sig Dispense Refill  . DULoxetine (CYMBALTA) 60 MG capsule Take 2 capsules (120 mg total) daily by mouth. 60 capsule 3  . furosemide (LASIX) 40 MG tablet TAKE 1 TABLET BY MOUTH TWICE A DAY 60 tablet 1  . levETIRAcetam (KEPPRA) 500 MG tablet Take 500 mg by mouth 2 (two) times daily.     . methadone (DOLOPHINE) 5 MG tablet Take 2.5 mg by mouth every morning.   0  . methazolamide (NEPTAZANE) 25 MG tablet Take 25 mg 3 (three) times daily by mouth.  0  . montelukast (SINGULAIR) 10 MG tablet TAKE 1 TABLET (10 MG TOTAL) BY MOUTH AT BEDTIME. 30 tablet 5  . potassium chloride (K-DUR) 10 MEQ tablet Take by mouth.    . pramipexole (MIRAPEX) 0.125 MG tablet Take 3 tablets (0.375 mg total) at bedtime by mouth. 90 tablet 3  . simvastatin (ZOCOR) 20 MG tablet TAKE 1 TABLET BY MOUTH  DAILY. FOLLOW UP LABS 30 tablet 3  . tiZANidine (ZANAFLEX) 4 MG capsule Take 4-8 mg by mouth 2 (two) times daily. ONE TAB AM -  PRN AND 2 TABS AT BEDTIME    . traMADol (ULTRAM) 50 MG tablet Take 50 mg by mouth at bedtime.     . Semaglutide (OZEMPIC) 0.25 or 0.5 MG/DOSE SOPN Inject 0.25 mg into the skin once a week for 28 days, THEN 0.5 mg once a week for 28 days. 4 pen 1   No current facility-administered medications for this visit.    Allergies  Allergen Reactions  . Penicillins Anaphylaxis    Has patient had a PCN reaction causing immediate rash, facial/tongue/throat swelling, SOB or lightheadedness with hypotension: Yes Has patient had a PCN reaction causing severe rash involving mucus membranes or skin necrosis: No Has patient had a PCN reaction that required hospitalization No Has patient had a PCN reaction occurring within the last 10 years: No If all of the above answers are "NO", then may proceed with Cephalosporin  use.   . Chlorhexidine Other (See Comments)    Dermatitis - Allergic  . Lactose Swelling and Rash  . Tape Rash    Plastic tape, silicone tape    Health Maintenance Health Maintenance  Topic Date Due  . TETANUS/TDAP  05/19/2019  . INFLUENZA VACCINE  Completed  . HIV Screening  Completed     Exam:  BP 119/83   Pulse 97   Wt 209 lb (94.8 kg)   BMI 37.02 kg/m  Gen: Well NAD HEENT: EOMI,  MMM Lungs: Normal work of breathing. CTABL Heart: RRR no MRG Abd: NABS, Soft. Nondistended, Nontender Exts: Brisk capillary refill, warm and well perfused.  Psych: Alert and oriented normal speech thought process and affect.  No SI or HI.  Lab Results  Component Value Date   HGBA1C 5.8 10/01/2016      No results found for this or any previous visit (from the past 72 hour(s)). No results found.    Assessment and Plan: 52 y.o. female with  Prediabetes: Reasonable to use GLP-1 agonists.  After reviewing formulary I think Ozempic is reasonable. New prescription sent to pharmacy for taper from 0.25 to 0.5mg  weekly.  Plan to increase to 1 mg soon.  Recheck in 2-3 months.  Depression and  anxiety: I think counseling or therapy is a great idea.  Plan to refer to behavioral health.  Recheck in a few months.  Pseudotumor cerebri.  Will follow along with neurosurgery.   Orders Placed This Encounter  Procedures  . Ambulatory referral to Behavioral Health    Referral Priority:   Routine    Referral Type:   Psychiatric    Referral Reason:   Specialty Services Required    Requested Specialty:   Behavioral Health    Number of Visits Requested:   1   Meds ordered this encounter  Medications  . Semaglutide (OZEMPIC) 0.25 or 0.5 MG/DOSE SOPN    Sig: Inject 0.25 mg into the skin once a week for 28 days, THEN 0.5 mg once a week for 28 days.    Dispense:  4 pen    Refill:  1     Discussed warning signs or symptoms. Please see discharge instructions. Patient expresses understanding.

## 2017-05-21 NOTE — Patient Instructions (Addendum)
Thank you for coming in today. Start Ozempic.  You should hear about therapy soon.   Recheck in 2-3 months.   Semaglutide injection solution What is this medicine? SEMAGLUTIDE (Sem a GLOO tide) is used to improve blood sugar control in adults with type 2 diabetes. This medicine may be used with other diabetes medicines. This medicine may be used for other purposes; ask your health care provider or pharmacist if you have questions. COMMON BRAND NAME(S): OZEMPIC What should I tell my health care provider before I take this medicine? They need to know if you have any of these conditions: -endocrine tumors (MEN 2) or if someone in your family had these tumors -eye disease, vision problems -history of pancreatitis -kidney disease -stomach problems -thyroid cancer or if someone in your family had thyroid cancer -an unusual or allergic reaction to semaglutide, other medicines, foods, dyes, or preservatives -pregnant or trying to get pregnant -breast-feeding How should I use this medicine? This medicine is for injection under the skin of your upper leg (thigh), stomach area, or upper arm. It is given once every week (every 7 days). You will be taught how to prepare and give this medicine. Use exactly as directed. Take your medicine at regular intervals. Do not take it more often than directed. If you use this medicine with insulin, you should inject this medicine and the insulin separately. Do not mix them together. Do not give the injections right next to each other. Change (rotate) injection sites with each injection. It is important that you put your used needles and syringes in a special sharps container. Do not put them in a trash can. If you do not have a sharps container, call your pharmacist or healthcare provider to get one. A special MedGuide will be given to you by the pharmacist with each prescription and refill. Be sure to read this information carefully each time. Talk to your  pediatrician regarding the use of this medicine in children. Special care may be needed. Overdosage: If you think you have taken too much of this medicine contact a poison control center or emergency room at once. NOTE: This medicine is only for you. Do not share this medicine with others. What if I miss a dose? If you miss a dose, take it as soon as you can within 5 days after the missed dose. Then take your next dose at your regular weekly time. If it has been longer than 5 days after the missed dose, do not take the missed dose. Take the next dose at your regular time. Do not take double or extra doses. If you have questions about a missed dose, contact your health care provider for advice. What may interact with this medicine? -other medicines for diabetes Many medications may cause changes in blood sugar, these include: -alcohol containing beverages -antiviral medicines for HIV or AIDS -aspirin and aspirin-like drugs -certain medicines for blood pressure, heart disease, irregular heart beat -chromium -diuretics -female hormones, such as estrogens or progestins, birth control pills -fenofibrate -gemfibrozil -isoniazid -lanreotide -female hormones or anabolic steroids -MAOIs like Carbex, Eldepryl, Marplan, Nardil, and Parnate -medicines for weight loss -medicines for allergies, asthma, cold, or cough -medicines for depression, anxiety, or psychotic disturbances -niacin -nicotine -NSAIDs, medicines for pain and inflammation, like ibuprofen or naproxen -octreotide -pasireotide -pentamidine -phenytoin -probenecid -quinolone antibiotics such as ciprofloxacin, levofloxacin, ofloxacin -some herbal dietary supplements -steroid medicines such as prednisone or cortisone -sulfamethoxazole; trimethoprim -thyroid hormones Some medications can hide the warning symptoms of low  blood sugar (hypoglycemia). You may need to monitor your blood sugar more closely if you are taking one of these  medications. These include: -beta-blockers, often used for high blood pressure or heart problems (examples include atenolol, metoprolol, propranolol) -clonidine -guanethidine -reserpine This list may not describe all possible interactions. Give your health care provider a list of all the medicines, herbs, non-prescription drugs, or dietary supplements you use. Also tell them if you smoke, drink alcohol, or use illegal drugs. Some items may interact with your medicine. What should I watch for while using this medicine? Visit your doctor or health care professional for regular checks on your progress. Drink plenty of fluids while taking this medicine. Check with your doctor or health care professional if you get an attack of severe diarrhea, nausea, and vomiting. The loss of too much body fluid can make it dangerous for you to take this medicine. A test called the HbA1C (A1C) will be monitored. This is a simple blood test. It measures your blood sugar control over the last 2 to 3 months. You will receive this test every 3 to 6 months. Learn how to check your blood sugar. Learn the symptoms of low and high blood sugar and how to manage them. Always carry a quick-source of sugar with you in case you have symptoms of low blood sugar. Examples include hard sugar candy or glucose tablets. Make sure others know that you can choke if you eat or drink when you develop serious symptoms of low blood sugar, such as seizures or unconsciousness. They must get medical help at once. Tell your doctor or health care professional if you have high blood sugar. You might need to change the dose of your medicine. If you are sick or exercising more than usual, you might need to change the dose of your medicine. Do not skip meals. Ask your doctor or health care professional if you should avoid alcohol. Many nonprescription cough and cold products contain sugar or alcohol. These can affect blood sugar. Pens should never be  shared. Even if the needle is changed, sharing may result in passing of viruses like hepatitis or HIV. Wear a medical ID bracelet or chain, and carry a card that describes your disease and details of your medicine and dosage times. What side effects may I notice from receiving this medicine? Side effects that you should report to your doctor or health care professional as soon as possible: -allergic reactions like skin rash, itching or hives, swelling of the face, lips, or tongue -breathing problems -changes in vision -diarrhea that continues or is severe -lump or swelling on the neck -severe nausea -signs and symptoms of infection like fever or chills; cough; sore throat; pain or trouble passing urine -signs and symptoms of low blood sugar such as feeling anxious, confusion, dizziness, increased hunger, unusually weak or tired, sweating, shakiness, cold, irritable, headache, blurred vision, fast heartbeat, loss of consciousness -signs and symptoms of kidney injury like trouble passing urine or change in the amount of urine -trouble swallowing -unusual stomach upset or pain -vomiting Side effects that usually do not require medical attention (report to your doctor or health care professional if they continue or are bothersome): -constipation -diarrhea -nausea -pain, redness, or irritation at site where injected -stomach upset This list may not describe all possible side effects. Call your doctor for medical advice about side effects. You may report side effects to FDA at 1-800-FDA-1088. Where should I keep my medicine? Keep out of the reach of  children. Store unopened pens in a refrigerator between 2 and 8 degrees C (36 and 46 degrees F). Do not freeze. Protect from light and heat. After you first use the pen, it can be stored for 56 days at room temperature between 15 and 30 degrees C (59 and 86 degrees F) or in a refrigerator. Throw away your used pen after 56 days or after the expiration  date, whichever comes first. Do not store your pen with the needle attached. If the needle is left on, medicine may leak from the pen. NOTE: This sheet is a summary. It may not cover all possible information. If you have questions about this medicine, talk to your doctor, pharmacist, or health care provider.  2018 Elsevier/Gold Standard (2016-05-21 14:43:35)

## 2017-05-21 NOTE — H&P (Signed)
Katrina Fernandez Documented: 05/21/2017 2:25 PM Location: [facility] Patient #: [id number] DOB: Jan 11, 1966 Married / Language: Katrina Fernandez / Race: White Female  History of Present Illness (Katrina Fernandez; 05/21/2017 3:13 PM) Patient words: This is a very pleasant 52 year old woman is referred by Dr. Saintclair Halsted who plans to do a ventriculoperitoneal shunt for chronic headaches related to pseudotumor syndrome. She had previously had a lumbar shunt but this has been removed due to complications. Her abdominal surgical history is notable for a total abdominal hysterectomy via Pfannenstiel in 1994, appendectomy in childhood at age 71, laparoscopic cholecystectomy, laparoscopic gastric banding and subsequent laparoscopic removal of gastric band.  The patient is a 52 year old female.   Past Surgical History (Katrina Fernandez, [place]; 05/21/2017 2:26 PM) Appendectomy Colon Polyp Removal - Colonoscopy Gallbladder Surgery - Laparoscopic Hysterectomy (not due to cancer) - Complete Lap Band Mammoplasty; Reduction Bilateral. Spinal Surgery - Lower Back Spinal Surgery - Neck  Diagnostic Studies History (Katrina Fernandez, [place]; 05/21/2017 2:26 PM) Colonoscopy 1-5 years ago Mammogram 1-3 years ago Pap Smear 1-5 years ago  Allergies (Katrina Fernandez, [place]; 05/21/2017 2:28 PM) Penicillins Anaphylaxis. Allergies Reconciled  Medication History (Katrina Fernandez, [place]; 05/21/2017 2:29 PM) Methadone HCl (5MG  Tablet, Oral) Active. TraMADol HCl (50MG  Tablet, Oral) Active. DULoxetine HCl (60MG  Capsule DR Part, Oral) Active. MethazolAMIDE (25MG  Tablet, Oral) Active. Montelukast Sodium (10MG  Tablet, Oral) Active. Pramipexole Dihydrochloride (0.125MG  Tablet, Oral) Active. Simvastatin (20MG  Tablet, Oral) Active. LevETIRAcetam (500MG  Tablet, Oral) Active. Medications Reconciled  Social History (Katrina Fernandez, [place]; 05/21/2017 2:26 PM) Alcohol use Occasional alcohol use. Caffeine use  Coffee, Tea. No drug use Tobacco use Never smoker.  Family History (Katrina Fernandez, [place]; 05/21/2017 2:26 PM) Arthritis Mother. Bleeding disorder Father. Colon Polyps Father, Mother. Depression Mother. Diabetes Mellitus Father, Mother. Migraine Headache Mother, Sister. Respiratory Condition Mother. Thyroid problems Mother.  Pregnancy / Birth History (Katrina Fernandez, [place]; 05/21/2017 2:26 PM) Age at menarche 71 years. Age of menopause 36-50 Contraceptive History Oral contraceptives. Gravida 0 Para 0  Other Problems (Katrina Fernandez, [place]; 05/21/2017 2:26 PM) Anxiety Disorder Arthritis Back Pain Cholelithiasis Depression Gastroesophageal Reflux Disease Hemorrhoids Hypercholesterolemia Kidney Stone Migraine Headache Oophorectomy Bilateral. Other disease, cancer, significant illness Pulmonary Embolism / Blood Clot in Legs     Review of Systems (Katrina A. Brown RMA; 05/21/2017 2:26 PM) General Present- Fatigue. Not Present- Appetite Loss, Chills, Fever, Night Sweats, Weight Gain and Weight Loss. Skin Not Present- Change in Wart/Mole, Dryness, Hives, Jaundice, New Lesions, Non-Healing Wounds, Rash and Ulcer. HEENT Present- Seasonal Allergies. Not Present- Earache, Hearing Loss, Hoarseness, Nose Bleed, Oral Ulcers, Ringing in the Ears, Sinus Pain, Sore Throat, Visual Disturbances, Wears glasses/contact lenses and Yellow Eyes. Respiratory Present- Snoring. Not Present- Bloody sputum, Chronic Cough, Difficulty Breathing and Wheezing. Breast Not Present- Breast Mass, Breast Pain, Nipple Discharge and Skin Changes. Cardiovascular Present- Swelling of Extremities. Not Present- Chest Pain, Difficulty Breathing Lying Down, Leg Cramps, Palpitations, Rapid Heart Rate and Shortness of Breath. Gastrointestinal Present- Bloating, Constipation and Hemorrhoids. Not Present- Abdominal Pain, Bloody Stool, Change in Bowel Habits, Chronic diarrhea, Difficulty Swallowing,  Excessive gas, Gets full quickly at meals, Indigestion, Nausea, Rectal Pain and Vomiting. Musculoskeletal Present- Back Pain, Joint Pain, Joint Stiffness, Muscle Pain and Muscle Weakness. Not Present- Swelling of Extremities. Neurological Present- Decreased Memory and Headaches. Not Present- Fainting, Numbness, Seizures, Tingling, Tremor, Trouble walking and Weakness. Psychiatric Present- Anxiety and Depression. Not Present- Bipolar, Change in Sleep Pattern, Fearful and Frequent crying.  Endocrine Present- Hot flashes. Not Present- Cold Intolerance, Excessive Hunger, Hair Changes, Heat Intolerance and New Diabetes. Hematology Not Present- Blood Thinners, Easy Bruising, Excessive bleeding, Gland problems, HIV and Persistent Infections.  Vitals (Katrina A. Brown RMA; 05/21/2017 2:26 PM) 05/21/2017 2:26 PM Weight: 208.5 lb Height: 63in Body Surface Area: 1.97 m Body Mass Index: 36.93 kg/m  Temp.: 96.68F  Pulse: 97 (Regular)  BP: 132/82 (Sitting, Left Arm, Standard)      Physical Exam (Sameera Betton A. Kae Heller Fernandez; 05/21/2017 3:14 PM)  The physical exam findings are as follows: Note:She is alert and well-appearing Extraocular motions intact, no scleral icterus Neck without mass or thyromegaly Moist mucous membranes, dentition intact Unlabored respirations, symmetrical air entry Regular rate and rhythm, no pedal edema Abdomen is soft and nontender. No mass or organomegaly. Well-healed prior surgical scars. Extremities warm and well-perfused without deformity Neuro grossly intact, normal gait Psych normal mood and affect, appropriate insight    Assessment & Plan (Katrina Fernandez A. Kae Heller Fernandez; 05/21/2017 3:16 PM)  PSEUDOTUMOR CEREBRI (G93.2) Story: Dr. Saintclair Halsted planning to do a ventriculoperitoneal shunt. Will plan diagnostic laparoscopy to shunt placement. I discussed with her risks of bleeding, infection, pain, scarring, injury to intra-abdominal structures especially if she has significant  adhesive disease. She is well aware, had insightful questions which were answered to her satisfaction. Katrina Fernandez

## 2017-06-01 ENCOUNTER — Other Ambulatory Visit: Payer: Self-pay | Admitting: Neurosurgery

## 2017-06-09 ENCOUNTER — Ambulatory Visit (INDEPENDENT_AMBULATORY_CARE_PROVIDER_SITE_OTHER): Payer: Medicare HMO | Admitting: Sports Medicine

## 2017-06-09 DIAGNOSIS — M7062 Trochanteric bursitis, left hip: Secondary | ICD-10-CM | POA: Diagnosis not present

## 2017-06-09 NOTE — Assessment & Plan Note (Signed)
Ultrasound-guided injection as above. Rehab exercises given. Return as needed.

## 2017-06-09 NOTE — Progress Notes (Signed)
Subjective:    I'm seeing this patient as a consultation for: Dr. Lynne Leader  CC: Left hip pain  HPI: This is a pleasant 52 year old female, for the past several months she has had increasing pain in her left lateral hip, worse when laying on the ipsilateral side, worse with palpation, severe, persistent, localized without radiation.  She has had injections in the past with her pain doctor, but desires to do her treatment here.  No bowel or bladder dysfunction, saddle numbness, nothing radicular.  No trauma.  I reviewed the past medical history, family history, social history, surgical history, and allergies today and no changes were needed.  Please see the problem list section below in epic for further details.  Past Medical History: Past Medical History:  Diagnosis Date  . Anemia   . Arthritis    "back and knees" (07/23/2016)  . Chiari malformation type I ([place]) dx'd 2014  . Chronic back pain    buldging, DDD; "neck and lower back" (07/23/2016)  . Chronic constipation    takes Miralax daily  . Chronic pain syndrome    takes Methadone and Keppra daily  . Depression    takes Effexor daily  . Fibromyalgia   . GERD (gastroesophageal reflux disease)    not taking anything at the present time  . History of DVT of lower extremity 07/2012   LEFT LEG --  3/ 2014.Was on a blood transfusion for 6 months  . History of kidney stones   . History of MRSA infection 10 yrs ago  . Hyperlipidemia    takes Simvastatin daily  . Migraine    "daily recently" (07/23/2016)  . Muscle spasm    takes Zanaflex nightly  . Peripheral edema    takes Lasix daily as needed  . Peripheral neuropathy   . Pneumonia    hx of-7-8 yrs ago  . PONV (postoperative nausea and vomiting)   . Seasonal allergies    takes Claritin daily  . Spinal headache    "since 2002; still get them; try to manage it w/lying flat"  . Weakness    and numbness in right hand   Past Surgical History: Past Surgical History:    Procedure Laterality Date  . ANTERIOR CERVICAL DECOMP/DISCECTOMY FUSION  07-25-2009   C5 -- C6  . APPENDECTOMY  age 11  . BACK SURGERY    . BREAST REDUCTION SURGERY Bilateral 06/07/2013   Procedure: BILATERAL MAMMARY REDUCTION  (BREAST);  Surgeon: Theodoro Kos, DO;  Location: [facility];  Service: Plastics;  Laterality: Bilateral;  . BREAST REDUCTION SURGERY Bilateral 06/07/2013   Procedure:  LIPOSUCTION;  Surgeon: Theodoro Kos, DO;  Location: [facility];  Service: Plastics;  Laterality: Bilateral;  . CARPAL TUNNEL RELEASE Bilateral 2001  . COLONOSCOPY  X 2   "polyps were benign"  . ESOPHAGOGASTRODUODENOSCOPY    . gastric banding undone  11/2014  . LAPAROSCOPIC CHOLECYSTECTOMY  ~ 2000  . LAPAROSCOPIC GASTRIC BANDING  2011  . LUMBAR LAMINECTOMY/DECOMPRESSION MICRODISCECTOMY N/A 07/23/2016   Procedure: Decompressive Lumbar Laminectomy Lumbar two-three  for Repair of  Cerberal Spinal Fluid  Leak;  Surgeon: Kary Kos, MD;  Location: [facility];  Service: Neurosurgery;  Laterality: N/A;  . PLACEMENT LUMBOPERITONEAL SHUNT FOR CEREBROSPINAL FLUID DIVERSION AWAY FROM C8 PERINEURAL CYST  09-05-2003  &  12-24-2003   08-31-2003  REMOVAL SHUNT  . POSTERIOR CERVICAL LAMINECTOMY  03-01-2001   C7-8 and Exploration C8 perineural cyst  . POSTERIOR LUMBAR LAMINECTOMY L4-5/  DISKECTOMY  L5-S1  05-20-2005  . RE-DO DECOMPRESSION LAMINECTOMY AND FUSION L4-5  &  L5-S1  12-20-2006   02-04-2007--  RE-EXPLORATION L4 to S1, I & D LUMBAR WOUND HEMATOMA  . REDUCTION MAMMAPLASTY    . SHUNT REMOVAL N/A 07/10/2016   Procedure: SHUNT REMOVAL;  Surgeon: Kary Kos, MD;  Location: [facility];  Service: Neurosurgery;  Laterality: N/A;  SHUNT REMOVAL  . SPINAL CORD STIMULATOR INSERTION N/A 04/26/2015   Procedure: LUMBAR SPINAL CORD STIMULATOR INSERTION;  Surgeon: Clydell Hakim, MD;  Location: [facility];  Service: Neurosurgery;  Laterality: N/A;  LUMBAR SPINAL CORD STIMULATOR INSERTION  . TENNIS ELBOW  RELEASE/NIRSCHEL PROCEDURE Left 12/13/2014   Procedure: LEFT ELBOW LATERAL EPICONDYLAR DEBRIDEMENT AND OR REPAIR -RECONSTRUCTION ;  Surgeon: Iran Planas, MD;  Location: [facility];  Service: Orthopedics;  Laterality: Left;  . TONSILLECTOMY    . TOTAL ABDOMINAL HYSTERECTOMY  1994   w/  Bilateral Salpinoophorectomy   Social History: Social History   Socioeconomic History  . Marital status: Married    Spouse name: Not on file  . Number of children: 2  . Years of education: Assoc  . Highest education level: Not on file  Social Needs  . Financial resource strain: Not on file  . Food insecurity - worry: Not on file  . Food insecurity - inability: Not on file  . Transportation needs - medical: Not on file  . Transportation needs - non-medical: Not on file  Occupational History  . Occupation: Disabled   Tobacco Use  . Smoking status: Never Smoker  . Smokeless tobacco: Never Used  Substance and Sexual Activity  . Alcohol use: No  . Drug use: No  . Sexual activity: Not Currently    Birth control/protection: Surgical  Other Topics Concern  . Not on file  Social History Narrative   Caffeine: occasionally    Family History: Family History  Problem Relation Age of Onset  . Depression Unknown   . Colon cancer Father   . Ovarian cancer Maternal Grandmother   . Pancreatic cancer Maternal Grandfather    Allergies: Allergies  Allergen Reactions  . Penicillins Anaphylaxis    Has patient had a PCN reaction causing immediate rash, facial/tongue/throat swelling, SOB or lightheadedness with hypotension: Yes Has patient had a PCN reaction causing severe rash involving mucus membranes or skin necrosis: No Has patient had a PCN reaction that required hospitalization No Has patient had a PCN reaction occurring within the last 10 years: No If all of the above answers are "NO", then may proceed with Cephalosporin use.   . Chlorhexidine Other (See Comments)    Dermatitis -  Allergic  . Lactose Swelling and Rash  . Tape Rash    Plastic tape, silicone tape   Medications: See med rec.  Review of Systems: No headache, visual changes, nausea, vomiting, diarrhea, constipation, dizziness, abdominal pain, skin rash, fevers, chills, night sweats, weight loss, swollen lymph nodes, body aches, joint swelling, muscle aches, chest pain, shortness of breath, mood changes, visual or auditory hallucinations.   Objective:   General: Well Developed, well nourished, and in no acute distress.  Neuro:  Extra-ocular muscles intact, able to move all 4 extremities, sensation grossly intact.  Deep tendon reflexes tested were normal. Psych: Alert and oriented, mood congruent with affect. ENT:  Ears and nose appear unremarkable.  Hearing grossly normal. Neck: Unremarkable overall appearance, trachea midline.  No visible thyroid enlargement. Eyes: Conjunctivae and lids appear unremarkable.  Pupils equal and round. Skin: Warm  and dry, no rashes noted.  Cardiovascular: Pulses palpable, no extremity edema. Left hip: ROM IR: 60 Deg, ER: 60 Deg, Flexion: 120 Deg, Extension: 100 Deg, Abduction: 45 Deg, Adduction: 45 Deg Strength IR: 5/5, ER: 5/5, Flexion: 5/5, Extension: 5/5, Abduction: 5/5, Adduction: 5/5 Pelvic alignment unremarkable to inspection and palpation. Standing hip rotation and gait without trendelenburg / unsteadiness. Greater trochanter with exquisite tenderness to palpation. No tenderness over piriformis. No SI joint tenderness and normal minimal SI movement.  Procedure: Real-time Ultrasound Guided Injection of left greater trochanteric bursa Device: GE Logiq E  Verbal informed consent obtained.  Time-out conducted.  Noted no overlying erythema, induration, or other signs of local infection.  Skin prepped in a sterile fashion.  Local anesthesia: Topical Ethyl chloride.  With sterile technique and under real time ultrasound guidance: 22-gauge spinal needle advanced to  the greater trochanter, contacted bone and I injected medication, I also placed a medication superficial to the hip abductor insertion for a total of 1 cc kenalog 40, 2 cc lidocaine, 2 cc bupivacaine. Completed without difficulty  Pain immediately resolved suggesting accurate placement of the medication.  Advised to call if fevers/chills, erythema, induration, drainage, or persistent bleeding.  Images permanently stored and available for review in the ultrasound unit.  Impression: Technically successful ultrasound guided injection.  Impression and Recommendations:   This case required medical decision making of moderate complexity.  Trochanteric bursitis, left hip Ultrasound-guided injection as above. Rehab exercises given. Return as needed.  ___________________________________________ Gwen Her. Dianah Field, M.D., ABFM., CAQSM. Primary Care and [place] Instructor of [place] [employer] [facility] [employer] [facility]

## 2017-06-10 ENCOUNTER — Encounter: Payer: Self-pay | Admitting: Sports Medicine

## 2017-06-14 ENCOUNTER — Other Ambulatory Visit (HOSPITAL_COMMUNITY): Payer: Self-pay | Admitting: Neurosurgery

## 2017-06-14 DIAGNOSIS — G935 Compression of brain: Secondary | ICD-10-CM

## 2017-06-17 ENCOUNTER — Ambulatory Visit (HOSPITAL_COMMUNITY)
Admission: RE | Admit: 2017-06-17 | Discharge: 2017-06-17 | Disposition: A | Discharge status: Home / Self Care | Payer: Medicare HMO | Source: Ambulatory Visit | Attending: Neurosurgery | Admitting: Neurosurgery

## 2017-06-17 DIAGNOSIS — G935 Compression of brain: Secondary | ICD-10-CM | POA: Diagnosis present

## 2017-06-17 DIAGNOSIS — R51 Headache: Secondary | ICD-10-CM | POA: Diagnosis not present

## 2017-06-17 MED ORDER — IOPAMIDOL (ISOVUE-300) INJECTION 61%
INTRAVENOUS | Status: AC
  Administered 2017-06-17: 75 mL
  Filled 2017-06-17: qty 75

## 2017-06-18 NOTE — Pre-Procedure Instructions (Signed)
Ling Flesch Bransfield  06/18/2017      [id number] - [place], [place] - [address] [place] [address] Phone: [phone] Fax: [phone]    Your procedure is scheduled on Wednesday, Feb. 6th   Report to [facility] Admitting at 11:30 AM             (posted surgery time 1:30p - 4:25p)   Call this number if you have problems the morning of surgery:  [phone]   Remember:               4-5 days prior to surgery, STOP TAKING any Vitamins, Herbal Supplements, Anti-inflammatories.   Do not eat food or drink liquids after midnight, Tuesday.   Take these medicines the morning of surgery with A SIP OF WATER : Cymbalta, Keppra, Zanaflex   Do not wear jewelry, make-up or nail polish.  Do not wear lotions, powders,  perfumes, or deodorant.  Do not shave 48 hours prior to surgery.     Do not bring valuables to the hospital.  [facility] is not responsible for any belongings or valuables.  Contacts, dentures or bridgework may not be worn into surgery.  Leave your suitcase in the car.  After surgery it may be brought to your room.  For patients admitted to the hospital, discharge time will be determined by your treatment team.  Please read over the following fact sheets that you were given. Pain Booklet, MRSA Information and Surgical Site Infection Prevention      [facility]- Preparing For Surgery  Before surgery, you can play an important role. Because skin is not sterile, your skin needs to be as free of germs as possible. You can reduce the number of germs on your skin by washing with CHG (chlorahexidine gluconate) Soap before surgery.  CHG is an antiseptic cleaner which kills germs and bonds with the skin to continue killing germs even after washing.  Please do not use if you have an allergy to CHG or antibacterial soaps. If your skin becomes reddened/irritated stop using the CHG.  Do not shave (including legs and underarms) for  at least 48 hours prior to first CHG shower. It is OK to shave your face.  Please follow these instructions carefully.   1. Shower the NIGHT BEFORE SURGERY and the MORNING OF SURGERY with CHG.   2. If you chose to wash your hair, wash your hair first as usual with your normal shampoo.  3. After you shampoo, rinse your hair and body thoroughly to remove the shampoo.  4. Use CHG as you would any other liquid soap. You can apply CHG directly to the skin and wash gently with a scrungie or a clean washcloth.   5. Apply the CHG Soap to your body ONLY FROM THE NECK DOWN.  Do not use on open wounds or open sores. Avoid contact with your eyes, ears, mouth and genitals (private parts). Wash Face and genitals (private parts)  with your normal soap.  6. Wash thoroughly, paying special attention to the area where your surgery will be performed.  7. Thoroughly rinse your body with warm water from the neck down.  8. DO NOT shower/wash with your normal soap after using and rinsing off the CHG Soap.  9. Pat yourself dry with a CLEAN TOWEL.  10. Wear CLEAN PAJAMAS to bed the night before surgery, wear comfortable clothes the morning of surgery  11. Place CLEAN  SHEETS on your bed the night of your first shower and DO NOT SLEEP WITH PETS.    Day of Surgery: Do not apply any deodorants/lotions. Please wear clean clothes to the hospital/surgery center.

## 2017-06-21 ENCOUNTER — Encounter (HOSPITAL_COMMUNITY): Payer: Self-pay

## 2017-06-21 ENCOUNTER — Encounter (HOSPITAL_COMMUNITY)
Admission: RE | Admit: 2017-06-21 | Discharge: 2017-06-21 | Disposition: A | Discharge status: Home / Self Care | Payer: Medicare HMO | Source: Ambulatory Visit | Attending: Neurosurgery | Admitting: Neurosurgery

## 2017-06-21 ENCOUNTER — Other Ambulatory Visit: Payer: Self-pay

## 2017-06-21 DIAGNOSIS — Z981 Arthrodesis status: Secondary | ICD-10-CM | POA: Diagnosis not present

## 2017-06-21 DIAGNOSIS — Z8614 Personal history of Methicillin resistant Staphylococcus aureus infection: Secondary | ICD-10-CM | POA: Diagnosis not present

## 2017-06-21 DIAGNOSIS — G894 Chronic pain syndrome: Secondary | ICD-10-CM | POA: Diagnosis not present

## 2017-06-21 DIAGNOSIS — Z86718 Personal history of other venous thrombosis and embolism: Secondary | ICD-10-CM | POA: Diagnosis not present

## 2017-06-21 DIAGNOSIS — M797 Fibromyalgia: Secondary | ICD-10-CM | POA: Diagnosis not present

## 2017-06-21 DIAGNOSIS — R69 Illness, unspecified: Secondary | ICD-10-CM | POA: Diagnosis not present

## 2017-06-21 DIAGNOSIS — G960 Cerebrospinal fluid leak: Secondary | ICD-10-CM | POA: Diagnosis not present

## 2017-06-21 DIAGNOSIS — K219 Gastro-esophageal reflux disease without esophagitis: Secondary | ICD-10-CM | POA: Diagnosis not present

## 2017-06-21 DIAGNOSIS — Z87442 Personal history of urinary calculi: Secondary | ICD-10-CM | POA: Diagnosis not present

## 2017-06-21 HISTORY — DX: Anxiety disorder, unspecified: F41.9

## 2017-06-21 HISTORY — DX: Endometriosis, unspecified: N80.9

## 2017-06-21 LAB — BASIC METABOLIC PANEL
Anion gap: 12 (ref 5–15)
BUN: 12 mg/dL (ref 6–20)
CO2: 28 mmol/L (ref 22–32)
Calcium: 9.3 mg/dL (ref 8.9–10.3)
Chloride: 101 mmol/L (ref 101–111)
Creatinine, Ser: 0.81 mg/dL (ref 0.44–1.00)
GFR calc Af Amer: >60 mL/min (ref >60)
GFR calc non Af Amer: >60 mL/min (ref >60)
Glucose, Bld: 87 mg/dL (ref 65–99)
Potassium: 3.2 mmol/L — ABNORMAL LOW (ref 3.5–5.1)
Sodium: 141 mmol/L (ref 135–145)

## 2017-06-21 LAB — CBC WITH DIFFERENTIAL/PLATELET
Basophils Absolute: 0.1 10*3/uL (ref 0.0–0.1)
Basophils Relative: 1 %
Eosinophils Absolute: 0 10*3/uL (ref 0.0–0.7)
Eosinophils Relative: 0 %
HCT: 44.4 % (ref 36.0–46.0)
Hemoglobin: 14.7 g/dL (ref 12.0–15.0)
Lymphocytes Relative: 18 %
Lymphs Abs: 2.2 10*3/uL (ref 0.7–4.0)
MCH: 29.5 pg (ref 26.0–34.0)
MCHC: 33.1 g/dL (ref 30.0–36.0)
MCV: 89 fL (ref 78.0–100.0)
Monocytes Absolute: 1 10*3/uL (ref 0.1–1.0)
Monocytes Relative: 8 %
Neutro Abs: 9.1 10*3/uL — ABNORMAL HIGH (ref 1.7–7.7)
Neutrophils Relative %: 73 %
Platelets: 291 10*3/uL (ref 150–400)
RBC: 4.99 MIL/uL (ref 3.87–5.11)
RDW: 12.1 % (ref 11.5–15.5)
WBC: 12.4 10*3/uL — ABNORMAL HIGH (ref 4.0–10.5)

## 2017-06-21 LAB — HEMOGLOBIN A1C
Hgb A1c MFr Bld: 5.8 % — ABNORMAL HIGH (ref 4.8–5.6)
Mean Plasma Glucose: 119.76 mg/dL

## 2017-06-21 LAB — SURGICAL PCR SCREEN
MRSA, PCR: NEGATIVE
Staphylococcus aureus: NEGATIVE

## 2017-06-21 NOTE — Progress Notes (Signed)
Denies murmur, cp, sob.  Has had no cardiac tests nor has seen a cardio PCP is Dr. Lynne Leader  LOV 05/2017.   Goes to Pain Clinic at [facility] in [place] 05/2017 She stated she may be "pre" diabetic, on no meds.  A1C done today. Plus, she did say that yrs ago, tested positive for MRSA.  Retested today.

## 2017-06-23 ENCOUNTER — Encounter (HOSPITAL_COMMUNITY): Payer: Self-pay | Admitting: *Deleted

## 2017-06-23 ENCOUNTER — Encounter (HOSPITAL_COMMUNITY)
Admission: RE | Disposition: A | Discharge status: Home / Self Care | Payer: Self-pay | Source: Ambulatory Visit | Attending: Neurosurgery

## 2017-06-23 ENCOUNTER — Inpatient Hospital Stay (HOSPITAL_COMMUNITY): Payer: Medicare HMO | Admitting: Certified Registered Nurse Anesthetist

## 2017-06-23 ENCOUNTER — Inpatient Hospital Stay (HOSPITAL_COMMUNITY)
Admission: RE | Admit: 2017-06-23 | Discharge: 2017-06-24 | DRG: 033 | Disposition: A | Discharge status: Home / Self Care | Payer: Medicare HMO | Source: Ambulatory Visit | Attending: Neurosurgery | Admitting: Neurosurgery

## 2017-06-23 DIAGNOSIS — R69 Illness, unspecified: Secondary | ICD-10-CM | POA: Diagnosis not present

## 2017-06-23 DIAGNOSIS — Z87442 Personal history of urinary calculi: Secondary | ICD-10-CM | POA: Diagnosis not present

## 2017-06-23 DIAGNOSIS — Z79899 Other long term (current) drug therapy: Secondary | ICD-10-CM

## 2017-06-23 DIAGNOSIS — E7800 Pure hypercholesterolemia, unspecified: Secondary | ICD-10-CM | POA: Diagnosis present

## 2017-06-23 DIAGNOSIS — G894 Chronic pain syndrome: Secondary | ICD-10-CM | POA: Diagnosis present

## 2017-06-23 DIAGNOSIS — Z90710 Acquired absence of both cervix and uterus: Secondary | ICD-10-CM | POA: Diagnosis not present

## 2017-06-23 DIAGNOSIS — Z86718 Personal history of other venous thrombosis and embolism: Secondary | ICD-10-CM | POA: Diagnosis not present

## 2017-06-23 DIAGNOSIS — Z8614 Personal history of Methicillin resistant Staphylococcus aureus infection: Secondary | ICD-10-CM

## 2017-06-23 DIAGNOSIS — G9600 Cerebrospinal fluid leak, unspecified: Secondary | ICD-10-CM | POA: Diagnosis present

## 2017-06-23 DIAGNOSIS — F329 Major depressive disorder, single episode, unspecified: Secondary | ICD-10-CM | POA: Diagnosis present

## 2017-06-23 DIAGNOSIS — J449 Chronic obstructive pulmonary disease, unspecified: Secondary | ICD-10-CM | POA: Diagnosis present

## 2017-06-23 DIAGNOSIS — G932 Benign intracranial hypertension: Secondary | ICD-10-CM | POA: Diagnosis present

## 2017-06-23 DIAGNOSIS — E785 Hyperlipidemia, unspecified: Secondary | ICD-10-CM | POA: Diagnosis not present

## 2017-06-23 DIAGNOSIS — F419 Anxiety disorder, unspecified: Secondary | ICD-10-CM | POA: Diagnosis present

## 2017-06-23 DIAGNOSIS — E669 Obesity, unspecified: Secondary | ICD-10-CM | POA: Diagnosis present

## 2017-06-23 DIAGNOSIS — Z981 Arthrodesis status: Secondary | ICD-10-CM | POA: Diagnosis not present

## 2017-06-23 DIAGNOSIS — Z6835 Body mass index (BMI) 35.0-35.9, adult: Secondary | ICD-10-CM

## 2017-06-23 DIAGNOSIS — Z86010 Personal history of colonic polyps: Secondary | ICD-10-CM | POA: Diagnosis not present

## 2017-06-23 DIAGNOSIS — Z9049 Acquired absence of other specified parts of digestive tract: Secondary | ICD-10-CM

## 2017-06-23 DIAGNOSIS — K219 Gastro-esophageal reflux disease without esophagitis: Secondary | ICD-10-CM | POA: Diagnosis present

## 2017-06-23 DIAGNOSIS — G935 Compression of brain: Secondary | ICD-10-CM | POA: Diagnosis not present

## 2017-06-23 DIAGNOSIS — M797 Fibromyalgia: Secondary | ICD-10-CM | POA: Diagnosis not present

## 2017-06-23 HISTORY — PX: LAPAROSCOPY: SHX197

## 2017-06-23 HISTORY — PX: APPLICATION OF CRANIAL NAVIGATION: SHX6578

## 2017-06-23 HISTORY — PX: VENTRICULOPERITONEAL SHUNT: SHX204

## 2017-06-23 HISTORY — PX: LAPAROSCOPIC REVISION VENTRICULAR-PERITONEAL (V-P) SHUNT: SHX5924

## 2017-06-23 SURGERY — SHUNT INSERTION VENTRICULAR-PERITONEAL
Anesthesia: General | Site: Head

## 2017-06-23 MED ORDER — FENTANYL CITRATE (PF) 100 MCG/2ML IJ SOLN
INTRAMUSCULAR | Status: AC
  Filled 2017-06-23: qty 2

## 2017-06-23 MED ORDER — PROPOFOL 10 MG/ML IV BOLUS
INTRAVENOUS | Status: AC
  Filled 2017-06-23: qty 20

## 2017-06-23 MED ORDER — DULOXETINE HCL 60 MG PO CPEP
120.0000 mg | ORAL_CAPSULE | Freq: Every day | ORAL | Status: DC
  Administered 2017-06-24: 120 mg via ORAL
  Filled 2017-06-23: qty 2

## 2017-06-23 MED ORDER — PROPOFOL 500 MG/50ML IV EMUL
INTRAVENOUS | Status: DC | PRN
  Administered 2017-06-23: 100 ug/kg/min via INTRAVENOUS

## 2017-06-23 MED ORDER — LIDOCAINE HCL (CARDIAC) 20 MG/ML IV SOLN
INTRAVENOUS | Status: DC | PRN
  Administered 2017-06-23: 60 mg via INTRAVENOUS

## 2017-06-23 MED ORDER — BUPIVACAINE-EPINEPHRINE (PF) 0.5% -1:200000 IJ SOLN
INTRAMUSCULAR | Status: DC | PRN
  Administered 2017-06-23: 5 mL

## 2017-06-23 MED ORDER — BUPIVACAINE-EPINEPHRINE (PF) 0.5% -1:200000 IJ SOLN
INTRAMUSCULAR | Status: AC
  Filled 2017-06-23: qty 30

## 2017-06-23 MED ORDER — LEVETIRACETAM 500 MG PO TABS
500.0000 mg | ORAL_TABLET | Freq: Two times a day (BID) | ORAL | Status: DC
  Administered 2017-06-23 – 2017-06-24 (×2): 500 mg via ORAL
  Filled 2017-06-23 (×2): qty 1

## 2017-06-23 MED ORDER — TRAMADOL HCL 50 MG PO TABS
50.0000 mg | ORAL_TABLET | Freq: Every day | ORAL | Status: DC
  Administered 2017-06-23: 50 mg via ORAL
  Filled 2017-06-23: qty 1

## 2017-06-23 MED ORDER — METHADONE HCL 5 MG PO TABS
2.5000 mg | ORAL_TABLET | Freq: Every day | ORAL | Status: DC
  Administered 2017-06-24: 2.5 mg via ORAL
  Filled 2017-06-23: qty 1

## 2017-06-23 MED ORDER — SODIUM CHLORIDE 0.9 % IR SOLN
Status: DC | PRN
  Administered 2017-06-23: 15:00:00

## 2017-06-23 MED ORDER — CHLORHEXIDINE GLUCONATE CLOTH 2 % EX PADS: 6.0000 | MEDICATED_PAD | Freq: Once | CUTANEOUS | Status: DC

## 2017-06-23 MED ORDER — SODIUM CHLORIDE 0.9 % IV SOLN
0.0500 ug/kg/min | INTRAVENOUS | Status: AC
  Administered 2017-06-23: 0.05 ug/kg/min via INTRAVENOUS
  Filled 2017-06-23: qty 5000

## 2017-06-23 MED ORDER — ONDANSETRON HCL 4 MG PO TABS: 4.0000 mg | ORAL_TABLET | ORAL | Status: DC | PRN

## 2017-06-23 MED ORDER — FENTANYL CITRATE (PF) 100 MCG/2ML IJ SOLN
INTRAMUSCULAR | Status: DC | PRN
  Administered 2017-06-23 (×2): 50 ug via INTRAVENOUS
  Administered 2017-06-23: 100 ug via INTRAVENOUS

## 2017-06-23 MED ORDER — LACTATED RINGERS IV SOLN
INTRAVENOUS | Status: DC
  Administered 2017-06-23: 50 mL/h via INTRAVENOUS
  Administered 2017-06-23 (×2): via INTRAVENOUS

## 2017-06-23 MED ORDER — DOCUSATE SODIUM 100 MG PO CAPS
100.0000 mg | ORAL_CAPSULE | Freq: Two times a day (BID) | ORAL | Status: DC
  Administered 2017-06-23 – 2017-06-24 (×2): 100 mg via ORAL
  Filled 2017-06-23 (×2): qty 1

## 2017-06-23 MED ORDER — PRAMIPEXOLE DIHYDROCHLORIDE 0.25 MG PO TABS
0.3750 mg | ORAL_TABLET | Freq: Every day | ORAL | Status: DC
  Filled 2017-06-23 (×2): qty 1

## 2017-06-23 MED ORDER — PROPOFOL 10 MG/ML IV BOLUS
INTRAVENOUS | Status: DC | PRN
  Administered 2017-06-23: 120 mg via INTRAVENOUS
  Administered 2017-06-23: 40 mg via INTRAVENOUS

## 2017-06-23 MED ORDER — TIZANIDINE HCL 4 MG PO TABS
8.0000 mg | ORAL_TABLET | Freq: Every day | ORAL | Status: DC
  Administered 2017-06-23: 8 mg via ORAL
  Filled 2017-06-23: qty 2

## 2017-06-23 MED ORDER — SUGAMMADEX SODIUM 200 MG/2ML IV SOLN
INTRAVENOUS | Status: DC | PRN
  Administered 2017-06-23: 400 mg via INTRAVENOUS

## 2017-06-23 MED ORDER — BACITRACIN ZINC 500 UNIT/GM EX OINT
TOPICAL_OINTMENT | CUTANEOUS | Status: DC | PRN
  Administered 2017-06-23: 1 via TOPICAL

## 2017-06-23 MED ORDER — FUROSEMIDE 20 MG PO TABS
20.0000 mg | ORAL_TABLET | Freq: Every day | ORAL | Status: DC
  Administered 2017-06-24: 20 mg via ORAL
  Filled 2017-06-23: qty 1

## 2017-06-23 MED ORDER — MIDAZOLAM HCL 5 MG/5ML IJ SOLN
INTRAMUSCULAR | Status: DC | PRN
  Administered 2017-06-23 (×2): 2 mg via INTRAVENOUS

## 2017-06-23 MED ORDER — PANTOPRAZOLE SODIUM 40 MG IV SOLR
40.0000 mg | Freq: Every day | INTRAVENOUS | Status: DC
  Administered 2017-06-23: 40 mg via INTRAVENOUS
  Filled 2017-06-23: qty 40

## 2017-06-23 MED ORDER — LIDOCAINE-EPINEPHRINE 1 %-1:100000 IJ SOLN
INTRAMUSCULAR | Status: AC
  Filled 2017-06-23: qty 1

## 2017-06-23 MED ORDER — SODIUM CHLORIDE 0.9 % IR SOLN
Status: DC | PRN
  Administered 2017-06-23: 1000 mL

## 2017-06-23 MED ORDER — MIDAZOLAM HCL 2 MG/2ML IJ SOLN
INTRAMUSCULAR | Status: AC
  Filled 2017-06-23: qty 2

## 2017-06-23 MED ORDER — MEPERIDINE HCL 50 MG/ML IJ SOLN: 6.2500 mg | INTRAMUSCULAR | Status: DC | PRN

## 2017-06-23 MED ORDER — PROMETHAZINE HCL 25 MG/ML IJ SOLN: 6.2500 mg | INTRAMUSCULAR | Status: DC | PRN

## 2017-06-23 MED ORDER — BUPIVACAINE HCL (PF) 0.25 % IJ SOLN
INTRAMUSCULAR | Status: AC
  Filled 2017-06-23: qty 30

## 2017-06-23 MED ORDER — TIZANIDINE HCL 4 MG PO TABS
4.0000 mg | ORAL_TABLET | Freq: Every day | ORAL | Status: DC
  Filled 2017-06-23 (×2): qty 1

## 2017-06-23 MED ORDER — SCOPOLAMINE 1 MG/3DAYS TD PT72
1.0000 | MEDICATED_PATCH | Freq: Once | TRANSDERMAL | Status: DC
  Administered 2017-06-23: 1.5 mg via TRANSDERMAL

## 2017-06-23 MED ORDER — ONDANSETRON HCL 4 MG/2ML IJ SOLN
INTRAMUSCULAR | Status: DC | PRN
  Administered 2017-06-23: 4 mg via INTRAVENOUS

## 2017-06-23 MED ORDER — LIDOCAINE-EPINEPHRINE 1 %-1:100000 IJ SOLN
INTRAMUSCULAR | Status: DC | PRN
  Administered 2017-06-23: 4 mL

## 2017-06-23 MED ORDER — METHAZOLAMIDE 25 MG PO TABS
25.0000 mg | ORAL_TABLET | Freq: Two times a day (BID) | ORAL | Status: DC
  Filled 2017-06-23 (×3): qty 1

## 2017-06-23 MED ORDER — MONTELUKAST SODIUM 10 MG PO TABS
10.0000 mg | ORAL_TABLET | Freq: Every day | ORAL | Status: DC
  Administered 2017-06-23: 10 mg via ORAL
  Filled 2017-06-23: qty 1

## 2017-06-23 MED ORDER — SUGAMMADEX SODIUM 500 MG/5ML IV SOLN
INTRAVENOUS | Status: AC
  Filled 2017-06-23: qty 10

## 2017-06-23 MED ORDER — VANCOMYCIN HCL IN DEXTROSE 1-5 GM/200ML-% IV SOLN
INTRAVENOUS | Status: AC
  Filled 2017-06-23: qty 200

## 2017-06-23 MED ORDER — DEXAMETHASONE SODIUM PHOSPHATE 10 MG/ML IJ SOLN: 10.0000 mg | INTRAMUSCULAR | Status: DC

## 2017-06-23 MED ORDER — VANCOMYCIN HCL IN DEXTROSE 1-5 GM/200ML-% IV SOLN
1000.0000 mg | INTRAVENOUS | Status: AC
  Administered 2017-06-23: 1000 mg via INTRAVENOUS

## 2017-06-23 MED ORDER — ONDANSETRON HCL 4 MG/2ML IJ SOLN: 4.0000 mg | INTRAMUSCULAR | Status: DC | PRN

## 2017-06-23 MED ORDER — THROMBIN (RECOMBINANT) 5000 UNITS EX SOLR
CUTANEOUS | Status: DC | PRN
  Administered 2017-06-23 (×2): 5000 [IU] via TOPICAL

## 2017-06-23 MED ORDER — HYDROMORPHONE HCL 1 MG/ML IJ SOLN
1.0000 mg | INTRAMUSCULAR | Status: DC | PRN
  Administered 2017-06-23 – 2017-06-24 (×3): 1 mg via INTRAVENOUS
  Filled 2017-06-23 (×3): qty 1

## 2017-06-23 MED ORDER — TIZANIDINE HCL 4 MG PO CAPS: 4.0000 mg | ORAL_CAPSULE | Freq: Two times a day (BID) | ORAL | Status: DC

## 2017-06-23 MED ORDER — PRAMIPEXOLE DIHYDROCHLORIDE 0.125 MG PO TABS
0.3750 mg | ORAL_TABLET | Freq: Every evening | ORAL | Status: DC
  Filled 2017-06-23: qty 1

## 2017-06-23 MED ORDER — HEMOSTATIC AGENTS (NO CHARGE) OPTIME
TOPICAL | Status: DC | PRN
  Administered 2017-06-23: 1 via TOPICAL

## 2017-06-23 MED ORDER — VANCOMYCIN HCL IN DEXTROSE 1-5 GM/200ML-% IV SOLN
1000.0000 mg | Freq: Once | INTRAVENOUS | Status: AC
  Administered 2017-06-24: 1000 mg via INTRAVENOUS
  Filled 2017-06-23 (×2): qty 200

## 2017-06-23 MED ORDER — POTASSIUM CHLORIDE IN NACL 20-0.9 MEQ/L-% IV SOLN
INTRAVENOUS | Status: DC
  Administered 2017-06-23: 22:00:00 via INTRAVENOUS
  Filled 2017-06-23: qty 1000

## 2017-06-23 MED ORDER — ROCURONIUM BROMIDE 100 MG/10ML IV SOLN
INTRAVENOUS | Status: DC | PRN
  Administered 2017-06-23: 20 mg via INTRAVENOUS
  Administered 2017-06-23: 50 mg via INTRAVENOUS

## 2017-06-23 MED ORDER — FENTANYL CITRATE (PF) 250 MCG/5ML IJ SOLN
INTRAMUSCULAR | Status: AC
  Filled 2017-06-23: qty 5

## 2017-06-23 MED ORDER — THROMBIN 5000 UNITS EX SOLR
CUTANEOUS | Status: AC
  Filled 2017-06-23: qty 10000

## 2017-06-23 MED ORDER — CEFAZOLIN SODIUM-DEXTROSE 2-4 GM/100ML-% IV SOLN: 2.0000 g | Freq: Three times a day (TID) | INTRAVENOUS | Status: DC

## 2017-06-23 MED ORDER — MIDAZOLAM HCL 2 MG/2ML IJ SOLN: 0.5000 mg | Freq: Once | INTRAMUSCULAR | Status: DC | PRN

## 2017-06-23 MED ORDER — SCOPOLAMINE 1 MG/3DAYS TD PT72
MEDICATED_PATCH | TRANSDERMAL | Status: AC
  Filled 2017-06-23: qty 1

## 2017-06-23 MED ORDER — LACTATED RINGERS IV SOLN: INTRAVENOUS | Status: DC

## 2017-06-23 MED ORDER — BACITRACIN ZINC 500 UNIT/GM EX OINT
TOPICAL_OINTMENT | CUTANEOUS | Status: AC
  Filled 2017-06-23: qty 28.35

## 2017-06-23 MED ORDER — FENTANYL CITRATE (PF) 100 MCG/2ML IJ SOLN
25.0000 ug | INTRAMUSCULAR | Status: DC | PRN
  Administered 2017-06-23: 50 ug via INTRAVENOUS

## 2017-06-23 MED ORDER — PROMETHAZINE HCL 25 MG PO TABS: 12.5000 mg | ORAL_TABLET | ORAL | Status: DC | PRN

## 2017-06-23 MED ORDER — SIMVASTATIN 20 MG PO TABS: 20.0000 mg | ORAL_TABLET | Freq: Every day | ORAL | Status: DC

## 2017-06-23 MED ORDER — DEXAMETHASONE SODIUM PHOSPHATE 10 MG/ML IJ SOLN
INTRAMUSCULAR | Status: AC
  Filled 2017-06-23: qty 1

## 2017-06-23 MED ORDER — DEXAMETHASONE SODIUM PHOSPHATE 4 MG/ML IJ SOLN
INTRAMUSCULAR | Status: DC | PRN
  Administered 2017-06-23: 10 mg via INTRAVENOUS

## 2017-06-23 SURGICAL SUPPLY — 77 items
ADH SKN CLS APL DERMABOND .7 (GAUZE/BANDAGES/DRESSINGS) ×2
APL SKNCLS STERI-STRIP NONHPOA (GAUZE/BANDAGES/DRESSINGS) ×2
BAG DECANTER FOR FLEXI CONT (MISCELLANEOUS) ×3 IMPLANT
BENZOIN TINCTURE PRP APPL 2/3 (GAUZE/BANDAGES/DRESSINGS) ×3 IMPLANT
BLADE SURG 11 STRL SS (BLADE) ×3 IMPLANT
BUR ACORN 6.0 PRECISION (BURR) ×3 IMPLANT
CABLE BIPOLOR RESECTION CORD (MISCELLANEOUS) ×3 IMPLANT
CANISTER SUCT 3000ML PPV (MISCELLANEOUS) ×3 IMPLANT
CARTRIDGE OIL MAESTRO DRILL (MISCELLANEOUS) ×4 IMPLANT
CATH VENTRICULAR 7CM (Shunt) ×1 IMPLANT
CLIP RANEY DISP (INSTRUMENTS) IMPLANT
CONT SPEC 4OZ CLIKSEAL STRL BL (MISCELLANEOUS) ×1 IMPLANT
DERMABOND ADVANCED (GAUZE/BANDAGES/DRESSINGS) ×1
DERMABOND ADVANCED .7 DNX12 (GAUZE/BANDAGES/DRESSINGS) ×2 IMPLANT
DIFFUSER DRILL AIR PNEUMATIC (MISCELLANEOUS) ×6 IMPLANT
DRAPE INCISE IOBAN 85X60 (DRAPES) ×3 IMPLANT
DRAPE ORTHO SPLIT 77X108 STRL (DRAPES) ×6
DRAPE POUCH INSTRU U-SHP 10X18 (DRAPES) ×3 IMPLANT
DRAPE SURG 17X23 STRL (DRAPES) IMPLANT
DRAPE SURG ORHT 6 SPLT 77X108 (DRAPES) ×4 IMPLANT
DRSG OPSITE POSTOP 4X6 (GAUZE/BANDAGES/DRESSINGS) ×1 IMPLANT
ELECT REM PT RETURN 9FT ADLT (ELECTROSURGICAL) ×3
ELECTRODE REM PT RTRN 9FT ADLT (ELECTROSURGICAL) ×2 IMPLANT
GAUZE SPONGE 2X2 8PLY STRL LF (GAUZE/BANDAGES/DRESSINGS) ×2 IMPLANT
GAUZE SPONGE 4X4 16PLY XRAY LF (GAUZE/BANDAGES/DRESSINGS) IMPLANT
GLOVE BIO SURGEON STRL SZ 6 (GLOVE) ×1 IMPLANT
GLOVE BIO SURGEON STRL SZ7 (GLOVE) ×1 IMPLANT
GLOVE BIO SURGEON STRL SZ8 (GLOVE) ×3 IMPLANT
GLOVE BIOGEL PI IND STRL 6.5 (GLOVE) IMPLANT
GLOVE BIOGEL PI IND STRL 7.0 (GLOVE) IMPLANT
GLOVE BIOGEL PI IND STRL 7.5 (GLOVE) IMPLANT
GLOVE BIOGEL PI INDICATOR 6.5 (GLOVE) ×2
GLOVE BIOGEL PI INDICATOR 7.0 (GLOVE) ×2
GLOVE BIOGEL PI INDICATOR 7.5 (GLOVE) ×1
GLOVE INDICATOR 8.5 STRL (GLOVE) ×3 IMPLANT
GLOVE SS N UNI LF 7.0 STRL (GLOVE) ×2 IMPLANT
GLOVE SS N UNI LF 7.5 STRL (GLOVE) ×2 IMPLANT
GOWN STRL REUS W/ TWL LRG LVL3 (GOWN DISPOSABLE) ×6 IMPLANT
GOWN STRL REUS W/ TWL XL LVL3 (GOWN DISPOSABLE) ×2 IMPLANT
GOWN STRL REUS W/TWL LRG LVL3 (GOWN DISPOSABLE) ×9
GOWN STRL REUS W/TWL XL LVL3 (GOWN DISPOSABLE) ×3
GUIDEWIRE STRAIGHT .035 260CM (WIRE) ×1 IMPLANT
KIT BASIN OR (CUSTOM PROCEDURE TRAY) ×3 IMPLANT
KIT TURNOVER KIT B (KITS) ×1 IMPLANT
MARKER SPHERE PSV REFLC 13MM (MARKER) ×6 IMPLANT
NDL HYPO 25X1 1.5 SAFETY (NEEDLE) ×2 IMPLANT
NDL INSUFFLATION 14GA 120MM (NEEDLE) IMPLANT
NEEDLE HYPO 25X1 1.5 SAFETY (NEEDLE) ×3 IMPLANT
NEEDLE INSUFFLATION 14GA 120MM (NEEDLE) ×3 IMPLANT
NS IRRIG 1000ML POUR BTL (IV SOLUTION) ×3 IMPLANT
OIL CARTRIDGE MAESTRO DRILL (MISCELLANEOUS) ×6
PACK LAMINECTOMY NEURO (CUSTOM PROCEDURE TRAY) ×3 IMPLANT
PAD ARMBOARD 7.5X6 YLW CONV (MISCELLANEOUS) ×3 IMPLANT
PATTIES SURGICAL .5 X3 (DISPOSABLE) IMPLANT
PATTIES SURGICAL .75X.75 (GAUZE/BANDAGES/DRESSINGS) IMPLANT
SHEATH COOK PEEL AWAY SET 9F (SHEATH) ×2 IMPLANT
SHEATH PERITONEAL INTRO 61 (MISCELLANEOUS) ×3 IMPLANT
SHUNT STRATA 11 SNAP REG (Shunt) ×1 IMPLANT
SLEEVE ENDOPATH XCEL 5M (ENDOMECHANICALS) ×3 IMPLANT
SPONGE GAUZE 2X2 STER 10/PKG (GAUZE/BANDAGES/DRESSINGS) ×1
SPONGE LAP 4X18 X RAY DECT (DISPOSABLE) IMPLANT
SPONGE SURGIFOAM ABS GEL SZ50 (HEMOSTASIS) ×3 IMPLANT
STAPLER VISISTAT 35W (STAPLE) ×3 IMPLANT
STRIP CLOSURE SKIN 1/2X4 (GAUZE/BANDAGES/DRESSINGS) ×6 IMPLANT
SUT BONE WAX W31G (SUTURE) ×3 IMPLANT
SUT CHROMIC 3 0 PS 2 (SUTURE) IMPLANT
SUT MON AB 4-0 PC3 18 (SUTURE) ×3 IMPLANT
SUT SILK 0 TIES 10X30 (SUTURE) ×3 IMPLANT
SUT VIC AB 2-0 CT1 18 (SUTURE) ×3 IMPLANT
SUT VICRYL 4-0 PS2 18IN ABS (SUTURE) ×3 IMPLANT
SYR 5ML LL (SYRINGE) IMPLANT
TOWEL GREEN STERILE (TOWEL DISPOSABLE) ×3 IMPLANT
TOWEL GREEN STERILE FF (TOWEL DISPOSABLE) ×3 IMPLANT
TRAY LAPAROSCOPIC MC (CUSTOM PROCEDURE TRAY) ×3 IMPLANT
TROCAR XCEL NON-BLD 5MMX100MML (ENDOMECHANICALS) ×3 IMPLANT
TUBING INSUF HEATED (TUBING) ×1 IMPLANT
WATER STERILE IRR 1000ML POUR (IV SOLUTION) ×3 IMPLANT

## 2017-06-23 NOTE — H&P (Signed)
Katrina Fernandez DOB: Jan 10, 1966  History of Present Illness  Patient words: This is a very pleasant 52 year old woman is referred by Dr. Saintclair Halsted who plans to do a ventriculoperitoneal shunt for chronic headaches related to pseudotumor syndrome. She had previously had a lumbar shunt but this has been removed due to complications. Her abdominal surgical history is notable for a total abdominal hysterectomy via Pfannenstiel in 1994, appendectomy in childhood at age 46, laparoscopic cholecystectomy, laparoscopic gastric banding and subsequent laparoscopic removal of gastric band.   Past Surgical History  Appendectomy  Colon Polyp Removal - Colonoscopy  Gallbladder Surgery - Laparoscopic  Hysterectomy (not due to cancer) - Complete  Lap Band  Mammoplasty; Reduction  Bilateral. Spinal Surgery - Lower Back  Spinal Surgery - Neck   Diagnostic Studies History Colonoscopy  1-5 years ago Mammogram  1-3 years ago Pap Smear  1-5 years ago  Allergies  Penicillins  Anaphylaxis. Allergies Reconciled   Medication History  Methadone HCl (5MG  Tablet, Oral) Active. TraMADol HCl (50MG  Tablet, Oral) Active. DULoxetine HCl (60MG  Capsule DR Part, Oral) Active. MethazolAMIDE (25MG  Tablet, Oral) Active. Montelukast Sodium (10MG  Tablet, Oral) Active. Pramipexole Dihydrochloride (0.125MG  Tablet, Oral) Active. Simvastatin (20MG  Tablet, Oral) Active. LevETIRAcetam (500MG  Tablet, Oral) Active. Medications Reconciled  Social History Alcohol use  Occasional alcohol use. Caffeine use  Coffee, Tea. No drug use  Tobacco use  Never smoker.  Family History Arthritis  Mother. Bleeding disorder  Father. Colon Polyps  Father, Mother. Depression  Mother. Diabetes Mellitus  Father, Mother. Migraine Headache  Mother, Sister. Respiratory Condition  Mother. Thyroid problems  Mother.  Pregnancy / Birth History  Age at menarche  66 years. Age of menopause   14-50 Contraceptive History  Oral contraceptives. Gravida  0 Para  0  Other Problems  Anxiety Disorder  Arthritis  Back Pain  Cholelithiasis  Depression  Gastroesophageal Reflux Disease  Hemorrhoids  Hypercholesterolemia  Kidney Stone  Migraine Headache  Oophorectomy  Bilateral. Other disease, cancer, significant illness  Pulmonary Embolism / Blood Clot in Legs     Review of Systems General Present- Fatigue. Not Present- Appetite Loss, Chills, Fever, Night Sweats, Weight Gain and Weight Loss. Skin Not Present- Change in Wart/Mole, Dryness, Hives, Jaundice, New Lesions, Non-Healing Wounds, Rash and Ulcer. HEENT Present- Seasonal Allergies. Not Present- Earache, Hearing Loss, Hoarseness, Nose Bleed, Oral Ulcers, Ringing in the Ears, Sinus Pain, Sore Throat, Visual Disturbances, Wears glasses/contact lenses and Yellow Eyes. Respiratory Present- Snoring. Not Present- Bloody sputum, Chronic Cough, Difficulty Breathing and Wheezing. Breast Not Present- Breast Mass, Breast Pain, Nipple Discharge and Skin Changes. Cardiovascular Present- Swelling of Extremities. Not Present- Chest Pain, Difficulty Breathing Lying Down, Leg Cramps, Palpitations, Rapid Heart Rate and Shortness of Breath. Gastrointestinal Present- Bloating, Constipation and Hemorrhoids. Not Present- Abdominal Pain, Bloody Stool, Change in Bowel Habits, Chronic diarrhea, Difficulty Swallowing, Excessive gas, Gets full quickly at meals, Indigestion, Nausea, Rectal Pain and Vomiting. Musculoskeletal Present- Back Pain, Joint Pain, Joint Stiffness, Muscle Pain and Muscle Weakness. Not Present- Swelling of Extremities. Neurological Present- Decreased Memory and Headaches. Not Present- Fainting, Numbness, Seizures, Tingling, Tremor, Trouble walking and Weakness. Psychiatric Present- Anxiety and Depression. Not Present- Bipolar, Change in Sleep Pattern, Fearful and Frequent crying. Endocrine Present- Hot flashes. Not  Present- Cold Intolerance, Excessive Hunger, Hair Changes, Heat Intolerance and New Diabetes. Hematology Not Present- Blood Thinners, Easy Bruising, Excessive bleeding, Gland problems, HIV and Persistent Infections.  Vitals:   06/23/17 1141  BP: 129/80  Pulse: 66  Resp: 18  Temp: 98.5  F (36.9 C)  SpO2: 98%      Physical Exam The physical exam findings are as follows: Note:She is alert and well-appearing Extraocular motions intact, no scleral icterus Neck without mass or thyromegaly Moist mucous membranes, dentition intact Unlabored respirations, symmetrical air entry Regular rate and rhythm, no pedal edema Abdomen is soft and nontender. No mass or organomegaly. Well-healed prior surgical scars. Extremities warm and well-perfused without deformity Neuro grossly intact, normal gait Psych normal mood and affect, appropriate insight    Assessment & Plan  PSEUDOTUMOR CEREBRI (G93.2) Story: Dr. Saintclair Halsted planning to do a ventriculoperitoneal shunt. Will plan diagnostic laparoscopy to assist shunt placement. I discussed with her risks of bleeding, infection, pain, scarring, injury to intra-abdominal structures especially if she has significant adhesive disease. She is well aware, had insightful questions which were answered to her satisfaction.

## 2017-06-23 NOTE — Anesthesia Preprocedure Evaluation (Addendum)
Anesthesia Evaluation  Patient identified by MRN, date of birth, ID band Patient awake    Reviewed: Allergy & Precautions, NPO status , Patient's Chart, lab work & pertinent test results  History of Anesthesia Complications (+) PONV  Airway Mallampati: I  TM Distance: >3 FB Neck ROM: Full    Dental  (+) Chipped, Dental Advisory Given   Pulmonary neg pulmonary ROS, COPD,  COPD inhaler,    breath sounds clear to auscultation       Cardiovascular negative cardio ROS   Rhythm:Regular Rate:Normal     Neuro/Psych  Headaches, CSF leak, Chiari 1 Pseudotumor cerebreii    GI/Hepatic Neg liver ROS, GERD  Controlled,  Endo/Other  Morbid obesity  Renal/GU negative Renal ROS     Musculoskeletal  (+) Fibromyalgia -  Abdominal (+) + obese,   Peds  Hematology negative hematology ROS (+)   Anesthesia Other Findings   Reproductive/Obstetrics                            Anesthesia Physical Anesthesia Plan  ASA: III  Anesthesia Plan: General   Post-op Pain Management:    Induction: Intravenous  PONV Risk Score and Plan: Ondansetron, Dexamethasone, Propofol infusion and Scopolamine patch - Pre-op  Airway Management Planned: Oral ETT  Additional Equipment:   Intra-op Plan:   Post-operative Plan: Extubation in OR  Informed Consent: I have reviewed the patients History and Physical, chart, labs and discussed the procedure including the risks, benefits and alternatives for the proposed anesthesia with the patient or authorized representative who has indicated his/her understanding and acceptance.   Dental advisory given  Plan Discussed with: CRNA and Surgeon  Anesthesia Plan Comments: (Plan routine monitors, GETA)        Anesthesia Quick Evaluation

## 2017-06-23 NOTE — Transfer of Care (Signed)
Immediate Anesthesia Transfer of Care Note  Patient: Katrina Fernandez  Procedure(s) Performed: Shunt Placment stereotactic ventricular catheter placement and general surgery abdominal exposure  (Laparoscopic vs. Open) Brain Lab BRAIN LAB (N/A ) APPLICATION OF CRANIAL NAVIGATION (N/A ) Shunt Placment stereotactic ventricular catheter placement and general surgery abdominal exposure  (Laparoscopic vs. Open) Brain Lab (N/A ) LAPAROSCOPY DIAGNOSTIC (N/A )  Patient Location: PACU  Anesthesia Type:General  Level of Consciousness: awake, alert  and oriented  Airway & Oxygen Therapy: Patient Spontanous Breathing and Patient connected to nasal cannula oxygen  Post-op Assessment: Report given to RN, Post -op Vital signs reviewed and stable and Patient moving all extremities X 4  Post vital signs: Reviewed and stable  Last Vitals:  Vitals:   06/23/17 1141 06/23/17 1537  BP: 129/80 112/90  Pulse: 66 84  Resp: 18 19  Temp: 36.9 C 36.4 C  SpO2: 98% 98%    Last Pain:  Vitals:   06/23/17 1537  TempSrc:   PainSc: 0-No pain      Patients Stated Pain Goal: 8 (23/53/61 4431)  Complications: No apparent anesthesia complications

## 2017-06-23 NOTE — H&P (Signed)
Katrina Fernandez is an 52 y.o. female.   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 Patient had a lumbar shunt and did very well for years however developed acquired Chiari I valve malformation. We removed the lumbarperitoneal shunthowever patient's headaches have progressively worsened since removal of the shunt although imaging shows the Chiari has regressed somewhat improved.due to patient's failed conservative treatment with multiple blood patches as well as diuretics have recommended placement of a ventriculoperitoneal shunt. We've extensively gone over the risks and benefits of the operation with the patient as well as perioperative course expectations of outcome and alternatives of surgery and she understands and agrees to proceed forward. Imaging has revealed small ventricles and multiple abdominal surgeries so we are doing this under stereotactic guidance and with general surgery for their assistance with placement of the peritoneal tip of the catheter.  Past Medical History:  Diagnosis Date  . Anemia   . Anxiety   . Arthritis    "back and knees" (07/23/2016)  . Chiari malformation type I ([facility]) dx'd 2014  . Chronic back pain    buldging, DDD; "neck and lower back" (07/23/2016)  . Chronic constipation    takes Miralax daily  . Chronic pain syndrome    takes Methadone and Keppra daily  . Depression    takes Effexor daily  . Endometriosis    1995  . Fibromyalgia   . GERD (gastroesophageal reflux disease)    not taking anything at the present time  . History of DVT of lower extremity 07/2012   LEFT LEG --  3/ 2014.Was on a blood transfusion for 6 months  . History of kidney stones   . History of MRSA infection 10 yrs ago  . Hyperlipidemia    takes Simvastatin daily  . Migraine    "daily recently" (07/23/2016)  . Muscle spasm    takes Zanaflex nightly  . Peripheral edema    takes Lasix  daily as needed  . Peripheral neuropathy   . Pneumonia    hx of-7-8 yrs ago  . PONV (postoperative nausea and vomiting)   . Seasonal allergies    takes Claritin daily  . Spinal headache    "since 2002; still get them; try to manage it w/lying flat"  . Weakness    and numbness in right hand    Past Surgical History:  Procedure Laterality Date  . ANTERIOR CERVICAL DECOMP/DISCECTOMY FUSION  07-25-2009   C5 -- C6  . APPENDECTOMY  age 51  . BACK SURGERY    . BREAST REDUCTION SURGERY Bilateral 06/07/2013   Procedure: BILATERAL MAMMARY REDUCTION  (BREAST);  Surgeon: Theodoro Kos, DO;  Location: [facility];  Service: Plastics;  Laterality: Bilateral;  . BREAST REDUCTION SURGERY Bilateral 06/07/2013   Procedure:  LIPOSUCTION;  Surgeon: Theodoro Kos, DO;  Location: [facility];  Service: Plastics;  Laterality: Bilateral;  . CARPAL TUNNEL RELEASE Bilateral 2001  . COLONOSCOPY  X 2   "polyps were benign"  . DILATION AND CURETTAGE OF UTERUS    . ESOPHAGOGASTRODUODENOSCOPY    . gastric banding undone  11/2014  . LAPAROSCOPIC CHOLECYSTECTOMY  ~ 2000  . LAPAROSCOPIC GASTRIC BANDING  2011  . LUMBAR LAMINECTOMY/DECOMPRESSION MICRODISCECTOMY N/A 07/23/2016   Procedure: Decompressive Lumbar Laminectomy Lumbar two-three  for Repair of  Cerberal Spinal Fluid  Leak;  Surgeon: Kary Kos, MD;  Location: [facility];  Service: Neurosurgery;  Laterality: N/A;  .  PLACEMENT LUMBOPERITONEAL SHUNT FOR CEREBROSPINAL FLUID DIVERSION AWAY FROM C8 PERINEURAL CYST  09-05-2003  &  12-24-2003   08-31-2003  REMOVAL SHUNT  . POSTERIOR CERVICAL LAMINECTOMY  03-01-2001   C7-8 and Exploration C8 perineural cyst  . POSTERIOR LUMBAR LAMINECTOMY L4-5/  DISKECTOMY L5-S1  05-20-2005  . RE-DO DECOMPRESSION LAMINECTOMY AND FUSION L4-5  &  L5-S1  12-20-2006   02-04-2007--  RE-EXPLORATION L4 to S1, I & D LUMBAR WOUND HEMATOMA  . REDUCTION MAMMAPLASTY    . SHUNT REMOVAL N/A 07/10/2016   Procedure: SHUNT  REMOVAL;  Surgeon: Kary Kos, MD;  Location: [facility];  Service: Neurosurgery;  Laterality: N/A;  SHUNT REMOVAL  . SPINAL CORD STIMULATOR INSERTION N/A 04/26/2015   Procedure: LUMBAR SPINAL CORD STIMULATOR INSERTION;  Surgeon: Clydell Hakim, MD;  Location: [facility];  Service: Neurosurgery;  Laterality: N/A;  LUMBAR SPINAL CORD STIMULATOR INSERTION  . TENNIS ELBOW RELEASE/NIRSCHEL PROCEDURE Left 12/13/2014   Procedure: LEFT ELBOW LATERAL EPICONDYLAR DEBRIDEMENT AND OR REPAIR -RECONSTRUCTION ;  Surgeon: Iran Planas, MD;  Location: [facility];  Service: Orthopedics;  Laterality: Left;  . TOTAL ABDOMINAL HYSTERECTOMY  1994   w/  Bilateral Salpinoophorectomy    Family History  Problem Relation Age of Onset  . Depression Unknown   . Colon cancer Father   . Ovarian cancer Maternal Grandmother   . Pancreatic cancer Maternal Grandfather    Social History:  reports that  has never smoked. she has never used smokeless tobacco. She reports that she does not drink alcohol or use drugs.  Allergies:  Allergies  Allergen Reactions  . Penicillins Anaphylaxis    Has patient had a PCN reaction causing immediate rash, facial/tongue/throat swelling, SOB or lightheadedness with hypotension: Yes Has patient had a PCN reaction causing severe rash involving mucus membranes or skin necrosis: No Has patient had a PCN reaction that required hospitalization No Has patient had a PCN reaction occurring within the last 10 years: No If all of the above answers are "NO", then may proceed with Cephalosporin use.   . Lactose Swelling and Rash    SWELLING REACTION UNSPECIFIED   . Chlorhexidine Other (See Comments)    Dermatitis - Allergic  . Tape Rash    Plastic tape, silicone tape    Medications Prior to Admission  Medication Sig Dispense Refill  . DULoxetine (CYMBALTA) 60 MG capsule Take 2 capsules (120 mg total) daily by mouth. 60 capsule 3  . furosemide (LASIX) 40 MG tablet Take 20 mg by mouth  daily.    Marland Kitchen levETIRAcetam (KEPPRA) 500 MG tablet Take 500 mg by mouth 2 (two) times daily.     . methadone (DOLOPHINE) 5 MG tablet Take 2.5 mg by mouth every morning.   0  . methazolamide (NEPTAZANE) 25 MG tablet Take 25 mg by mouth 2 (two) times daily.   0  . montelukast (SINGULAIR) 10 MG tablet TAKE 1 TABLET (10 MG TOTAL) BY MOUTH AT BEDTIME. 30 tablet 5  . pramipexole (MIRAPEX) 0.125 MG tablet Take 3 tablets (0.375 mg total) at bedtime by mouth. (Patient taking differently: Take 0.375 mg by mouth every evening. Pt needs to take around 1800) 90 tablet 3  . Semaglutide (OZEMPIC) 0.25 or 0.5 MG/DOSE SOPN Inject 0.25 mg into the skin once a week for 28 days, THEN 0.5 mg once a week for 28 days. 4 pen 1  . simvastatin (ZOCOR) 20 MG tablet TAKE 1 TABLET BY MOUTH  DAILY. FOLLOW UP LABS 30 tablet 3  .  tiZANidine (ZANAFLEX) 4 MG capsule Take 4-8 mg by mouth 2 (two) times daily. ONE TAB AM - PRN AND 2 TABS AT BEDTIME    . traMADol (ULTRAM) 50 MG tablet Take 50-100 mg by mouth at bedtime.       Results for orders placed or performed during the hospital encounter of 06/21/17 (from the past 48 hour(s))  Basic metabolic panel     Status: Abnormal   Collection Time: 06/21/17  2:45 PM  Result Value Ref Range   Sodium 141 135 - 145 mmol/L   Potassium 3.2 (L) 3.5 - 5.1 mmol/L   Chloride 101 101 - 111 mmol/L   CO2 28 22 - 32 mmol/L   Glucose, Bld 87 65 - 99 mg/dL   BUN 12 6 - 20 mg/dL   Creatinine, Ser 0.81 0.44 - 1.00 mg/dL   Calcium 9.3 8.9 - 10.3 mg/dL   GFR calc non Af Amer >60 >60 mL/min   GFR calc Af Amer >60 >60 mL/min    Comment: (NOTE) The eGFR has been calculated using the CKD EPI equation. This calculation has not been validated in all clinical situations. eGFR's persistently <60 mL/min signify possible Chronic Kidney Disease.    Anion gap 12 5 - 15    Comment: Performed at [facility] [address] [place], [place] [address]  CBC WITH DIFFERENTIAL     Status: Abnormal    Collection Time: 06/21/17  2:45 PM  Result Value Ref Range   WBC 12.4 (H) 4.0 - 10.5 K/uL   RBC 4.99 3.87 - 5.11 MIL/uL   Hemoglobin 14.7 12.0 - 15.0 g/dL   HCT 44.4 36.0 - 46.0 %   MCV 89.0 78.0 - 100.0 fL   MCH 29.5 26.0 - 34.0 pg   MCHC 33.1 30.0 - 36.0 g/dL   RDW 12.1 11.5 - 15.5 %   Platelets 291 150 - 400 K/uL   Neutrophils Relative % 73 %   Neutro Abs 9.1 (H) 1.7 - 7.7 K/uL   Lymphocytes Relative 18 %   Lymphs Abs 2.2 0.7 - 4.0 K/uL   Monocytes Relative 8 %   Monocytes Absolute 1.0 0.1 - 1.0 K/uL   Eosinophils Relative 0 %   Eosinophils Absolute 0.0 0.0 - 0.7 K/uL   Basophils Relative 1 %   Basophils Absolute 0.1 0.0 - 0.1 K/uL    Comment: Performed at [facility] [address] [place], [place] [address]  Surgical pcr screen     Status: None   Collection Time: 06/21/17  3:12 PM  Result Value Ref Range   MRSA, PCR NEGATIVE NEGATIVE   Staphylococcus aureus NEGATIVE NEGATIVE    Comment: (NOTE) The Xpert SA Assay (FDA approved for NASAL specimens in patients 37 years of age and older), is one component of a comprehensive surveillance program. It is not intended to diagnose infection nor to guide or monitor treatment. Performed at [facility], [place] [address] [place],  [address]   Hemoglobin A1c     Status: Abnormal   Collection Time: 06/21/17  3:12 PM  Result Value Ref Range   Hgb A1c MFr Bld 5.8 (H) 4.8 - 5.6 %    Comment: (NOTE) Pre diabetes:          5.7%-6.4% Diabetes:              >6.4% Glycemic control for   <7.0% adults with diabetes    Mean Plasma Glucose 119.76 mg/dL  Comment: Performed at [facility], [place] [address] [place], [place] [address]   No results found.  Review of Systems  Musculoskeletal: Positive for back pain, falls and neck pain.  Neurological: Positive for tingling and headaches.    Blood pressure 129/80, pulse 66, temperature 98.5 F (36.9 C), temperature source Oral, resp. rate 18, height '5\' 3"'  (1.6  m), weight 90.5 kg (199 lb 8 oz), SpO2 98 %. Physical Exam  Constitutional: She is oriented to person, place, and time. She appears well-developed and well-nourished.  HENT:  Head: Normocephalic and atraumatic.  Mouth/Throat: Oropharynx is clear and moist.  Eyes: Pupils are equal, round, and reactive to light.  Neck: Normal range of motion.  Cardiovascular: Normal rate.  Respiratory: Effort normal.  GI: Soft.  Musculoskeletal: Normal range of motion.  Neurological: She is alert and oriented to person, place, and time. GCS eye subscore is 4. GCS verbal subscore is 5. GCS motor subscore is 6.  Patient is awake alert oriented cranial nerves are intact strength 5 out of 5 upper and lower extremities     Assessment/Plan 52 year old presents for ventricular peroneal shunt placement with stereotactic navigation and general surgery.  Kevonta Phariss P, MD 06/23/2017, 11:53 AM

## 2017-06-23 NOTE — Op Note (Signed)
Preoperative diagnosis: Chronic CSF leak  Postoperative diagnosis: Same  Procedure: Ventricular peritoneal shunt placement with the Medtronic strata II snap shunt assembly preprogrammed to 1.0 utilizing BrainLab stereotactic navigation in conjunction with general surgery with laparoscopic placement of the abdominal catheter  Surgeon: [name] Labresha Mellor  Co-surgeon: Dr. Jens Som  Anesthesia: Gen.  EBL: Minimal  History of present illness: A 52 year old female with long-standing history of chronic CSF leak. She had a lumbar peroneal shunt placed many years ago that did very well for her. However she developed an acquired Chiari I malformation from the lumbar shunting. I removed lumbar. No shunt and since then over last several months she's had progressive intractable headaches better than partial in nature and consistent with long-standing chronic CSF leak. She failed blood/trials as well as Diamox and all forms of conservative treatment. Because of this I recommended ventriculoperitoneal shunt placement. Because of her history of a CSF leak small ventricles and previous abdominal surgery I recommended we do this under stereotactic navigation along with general surgery laparoscopically place the peritoneal catheter. I extensively went over the risks and benefits of the procedure with her as well as perioperative course expectations of outcome and alternatives of surgery and she understood and agreed to proceed forward.  Operative procedure: The laparoscopic placement of the peritoneal catheter will be dictated in a separate operative note by Dr. Windle Guard the after the patient was positioned head was shaved and the right side of her head shoulder and abdomen was prepped and draped in routine sterile fashion. The BrainLab navigation system had been registered prior to draping and then entry site was confirmed after shaving a strip behind her ear for a parietal ventricular catheter placement. An L-shaped  incision was drawn out and a burr hole was drilled dura was coag ligament I then passed a trocar to free up the subcutaneous tissue and then passed the passer with a plastic sheath down over the clavicle through the chest into the abdominal cavity. After Dr. Windle Guard had achieved access in the intracranial space she incised over the trocar we passed the catheter through the plastic sheath remove the plastic sheath. At this point Dr. Windle Guard to the replacement of the peroneal catheter I coag related the dura incised in a cruciate fashion coag later the outer cortical surface and then utilizing a 7 cm ventricular catheter utilizing the stereotactic guided stylette initial pass was unsuccessful however second pass achieved CSF under pressure. I then snapped this in place and the snap shunt assembly of the Medtronic strata 2 valve system. Then closed the cranial wound with interrupted Vicryl staples in skin. Prior to closure we did observe brisk CSF egress out of the distal tip of the catheter in the intraperitoneal space.

## 2017-06-23 NOTE — Anesthesia Postprocedure Evaluation (Signed)
Anesthesia Post Note  Patient: Katrina Fernandez  Procedure(s) Performed: VENTRICULAR-PERITONEAL SHUNT INSERTION WITH ABDOMINAL LAPARASCOPY AND BRAINLAB NAVIGATION (N/A Head) APPLICATION OF CRANIAL NAVIGATION (N/A ) Shunt Placment stereotactic ventricular catheter placement and general surgery abdominal exposure  (Laparoscopic vs. Open) Brain Lab (N/A ) LAPAROSCOPY DIAGNOSTIC (N/A )     Patient location during evaluation: PACU Anesthesia Type: General Level of consciousness: awake and patient cooperative Pain management: pain level controlled Vital Signs Assessment: post-procedure vital signs reviewed and stable Respiratory status: spontaneous breathing, nonlabored ventilation, respiratory function stable and patient connected to nasal cannula oxygen Cardiovascular status: blood pressure returned to baseline and stable Postop Assessment: no apparent nausea or vomiting Anesthetic complications: no    Last Vitals:  Vitals:   06/23/17 1707 06/23/17 1722  BP: 101/75 115/77  Pulse: 78 78  Resp: 18 13  Temp:    SpO2: 93% 97%    Last Pain:  Vitals:   06/23/17 1734  TempSrc:   PainSc: 3                  Keryl Gholson

## 2017-06-23 NOTE — Anesthesia Procedure Notes (Signed)
Procedure Name: Intubation Date/Time: 06/23/2017 2:01 PM Performed by: Regan Mcbryar T, CRNA Pre-anesthesia Checklist: Patient identified, Emergency Drugs available, Suction available and Patient being monitored Patient Re-evaluated:Patient Re-evaluated prior to induction Oxygen Delivery Method: Circle system utilized Preoxygenation: Pre-oxygenation with 100% oxygen Induction Type: IV induction Ventilation: Mask ventilation without difficulty Laryngoscope Size: Mac and 4 Grade View: Grade I Tube type: Oral Tube size: 7.5 mm Number of attempts: 1 Airway Equipment and Method: Stylet (anterior airway pressure) Placement Confirmation: ETT inserted through vocal cords under direct vision,  positive ETCO2 and breath sounds checked- equal and bilateral Secured at: 22 (lip) cm Tube secured with: Tape Dental Injury: Teeth and Oropharynx as per pre-operative assessment  Comments: DVL and intubation performed by Sherie Don, SRNA

## 2017-06-23 NOTE — Op Note (Addendum)
Operative Note  Katrina Fernandez  [id number]  [id number]  06/23/2017   Surgeon: Vikki Ports A ConnorMD  Co-surgeon: Kary Kos, MD  Procedure performed: diagnostic laparoscopy, laparoscopic assisted abdominal portion of ventriculoperitoneal shunt insertion   Preop diagnosis: VP shunt insertion Post-op diagnosis/intraop findings: same  Specimens: no Retained items: vp shunt EBL: minimal cc Complications: none  Description of procedure: After obtaining informed consent the patient was taken to the operating room and placed supine on operating room table wheregeneral endotracheal anesthesia was initiated, preoperative antibiotics were administered, SCDs applied, and a formal timeout was performed. The abdomen was prepped and draped into the field. The peritoneal cavity was accessed using a optical entry with a 5 mm trocar in the left upper quadrant and insufflated to 15 mmHg. Initial inspection revealed some insufflation between the layers of omentum which did resorb over the course of the procedure but no evidence of injury from our entry. Some adhesions of omentum to the falciform ligament as well as adhesions between the colon and underside of the liver, but otherwise the anterior abdominal wall was free of adhesions. The colon was redundant and clearly full of stool. Vigorous peristalsis of the small bowel noted. The pelvis was not inspected. Under direct visualization a left hemi-abdominal 61mm trocar was inserted. The colon and other viscera in the left upper quadrant were inspected multiple times throughout the procedure to confirm no trocar injury. A small incision was made in the right epigastrium through which the tunneled sheath was directed by Dr. Saintclair Halsted. The shunt tubing was passed down through the sheath and the sheath was then removed and discarded. The tip of the shunt tubing was grasped with a tonsil and introduced to the peritoneal cavity. Using an atraumatic grasper the tubing was  pulled into the peritoneal cavity and coiled in the right upper quadrant. Once flow through the shunt was confirmed, the abdomen was desufflated and all trocars removed. The three skin incisions were closed with subcuticular monocryl and dermabond. This completed the abdominal portion of the case. Please see Dr. Windy Carina separately dictated note.   All counts were correct at the completion of the case.

## 2017-06-24 ENCOUNTER — Encounter (HOSPITAL_COMMUNITY): Payer: Self-pay | Admitting: Neurosurgery

## 2017-06-24 MED ORDER — HYDROCODONE-ACETAMINOPHEN 5-325 MG PO TABS: 1.0000 | ORAL_TABLET | ORAL | 0 refills | Status: DC | PRN

## 2017-06-24 MED FILL — Thrombin For Soln 5000 Unit: CUTANEOUS | Qty: 2 | Status: AC

## 2017-06-24 NOTE — Progress Notes (Signed)
S: She feels wonderful- hasn't felt this good in a long time. Passing flatus. No nausea/vomiting. Minimal abdominal pain.   Vitals, labs, intake/output, and orders reviewed at this time.  Gen: A&Ox3, no distress  H&N: EOMI, dressing intact to R temple, neck supple Chest: unlabored respirations, RRR Abd: soft, minimally appropriately tender, nondistended, incisions c/d/i with dermabond no evidence of infection Ext: warm, no edema Neuro: grossly normal  Lines/tubes/drains: PIV  A/P:  POD 1 laparoscopic assisted vp shunt insertion. Doing well. No concerns from abdominal standpoint. Planned discharge today.    Romana Juniper, MD [facility], [place] Pager [phone]

## 2017-06-24 NOTE — Progress Notes (Signed)
Dr. Saintclair Halsted at bedside. Discussed discharge medication and instructions.

## 2017-06-24 NOTE — Discharge Summary (Signed)
Physician Discharge Summary  Patient ID: Katrina Fernandez MRN: [id number] DOB/AGE: 1965-06-24 52 y.o.  Admit date: 06/23/2017 Discharge date: 06/24/2017  Admission Diagnoses: Chronic CSF leak  Discharge Diagnoses: same   Discharged Condition: good  Hospital Course: The patient was admitted on 06/23/2017 and taken to the operating room where the patient underwent ventricular peritoneal shunt placement. The patient tolerated the procedure well and was taken to the recovery room and then to the floor in stable condition. The hospital course was routine. There were no complications. The wound remained clean dry and intact. Pt had appropriate neck and head soreness. No complaints of new pain or new N/T/W. The patient remained afebrile with stable vital signs, and tolerated a regular diet. The patient continued to increase activities, and pain was well controlled with oral pain medications.   Consults: None  Significant Diagnostic Studies:  Results for orders placed or performed during the hospital encounter of 06/21/17  Surgical pcr screen  Result Value Ref Range   MRSA, PCR NEGATIVE NEGATIVE   Staphylococcus aureus NEGATIVE NEGATIVE  Basic metabolic panel  Result Value Ref Range   Sodium 141 135 - 145 mmol/L   Potassium 3.2 (L) 3.5 - 5.1 mmol/L   Chloride 101 101 - 111 mmol/L   CO2 28 22 - 32 mmol/L   Glucose, Bld 87 65 - 99 mg/dL   BUN 12 6 - 20 mg/dL   Creatinine, Ser 0.81 0.44 - 1.00 mg/dL   Calcium 9.3 8.9 - 10.3 mg/dL   GFR calc non Af Amer >60 >60 mL/min   GFR calc Af Amer >60 >60 mL/min   Anion gap 12 5 - 15  CBC WITH DIFFERENTIAL  Result Value Ref Range   WBC 12.4 (H) 4.0 - 10.5 K/uL   RBC 4.99 3.87 - 5.11 MIL/uL   Hemoglobin 14.7 12.0 - 15.0 g/dL   HCT 44.4 36.0 - 46.0 %   MCV 89.0 78.0 - 100.0 fL   MCH 29.5 26.0 - 34.0 pg   MCHC 33.1 30.0 - 36.0 g/dL   RDW 12.1 11.5 - 15.5 %   Platelets 291 150 - 400 K/uL   Neutrophils Relative % 73 %   Neutro Abs 9.1 (H) 1.7 - 7.7  K/uL   Lymphocytes Relative 18 %   Lymphs Abs 2.2 0.7 - 4.0 K/uL   Monocytes Relative 8 %   Monocytes Absolute 1.0 0.1 - 1.0 K/uL   Eosinophils Relative 0 %   Eosinophils Absolute 0.0 0.0 - 0.7 K/uL   Basophils Relative 1 %   Basophils Absolute 0.1 0.0 - 0.1 K/uL  Hemoglobin A1c  Result Value Ref Range   Hgb A1c MFr Bld 5.8 (H) 4.8 - 5.6 %   Mean Plasma Glucose 119.76 mg/dL    Ct Head W & Wo Contrast  Result Date: 06/18/2017 CLINICAL DATA:  52 y/o F; Chiari 1 malformation, head pressure, headaches, nausea, surgery 06/23/2017. EXAM: CT HEAD WITHOUT AND WITH CONTRAST TECHNIQUE: Contiguous axial images were obtained from the base of the skull through the vertex without and with intravenous contrast CONTRAST:  48mL ISOVUE-300 IOPAMIDOL (ISOVUE-300) INJECTION 61% COMPARISON:  04/23/2017 CT head. 08/31/2016 CT of the head with and without contrast. FINDINGS: Brain: No evidence of acute infarction, hemorrhage, hydrocephalus, extra-axial collection or mass lesion/mass effect of the brain. Right parietal region 7 mm calcified dural nodule is stable, possibly meningioma or dural plaque (series 3, image 124). Cerebellar tonsils measure up to 5 mm below foramen magnum (series 12, image 19)  Vascular: No hyperdense vessel or unexpected calcification. Visible vessels are patent. Skull: Normal. Negative for fracture or focal lesion. Sinuses/Orbits: No acute finding. Other: None. IMPRESSION: 1. Cerebellar tonsils 5 mm below foramen magnum. 2. No acute process of the brain or abnormal enhancement. Electronically Signed   By: Kristine Garbe M.D.   On: 06/18/2017 00:49    Antibiotics:  Anti-infectives (From admission, onward)   Start     Dose/Rate Route Frequency Ordered Stop   06/24/17 0600  vancomycin (VANCOCIN) IVPB 1000 mg/200 mL premix     1,000 mg 200 mL/hr over 60 Minutes Intravenous On call to O.R. 06/23/17 1201 06/23/17 1345   06/24/17 0200  vancomycin (VANCOCIN) IVPB 1000 mg/200 mL premix      1,000 mg 200 mL/hr over 60 Minutes Intravenous  Once 06/23/17 2200 06/24/17 0331   06/23/17 1830  ceFAZolin (ANCEF) IVPB 2g/100 mL premix  Status:  Discontinued     2 g 200 mL/hr over 30 Minutes Intravenous Every 8 hours 06/23/17 1829 06/23/17 2158   06/23/17 1508  bacitracin 50,000 Units in sodium chloride irrigation 0.9 % 500 mL irrigation  Status:  Discontinued       As needed 06/23/17 1509 06/23/17 1547   06/23/17 1203  vancomycin (VANCOCIN) 1-5 GM/200ML-% IVPB    Comments:  Leandrew Koyanagi   : cabinet override      06/23/17 1203 06/23/17 1345      Discharge Exam: Blood pressure 120/84, pulse (!) 57, temperature 99 F (37.2 C), temperature source Oral, resp. rate 17, height 5\' 3"  (1.6 m), weight 90.5 kg (199 lb 8 oz), SpO2 97 %. Neurologic: Grossly normal Ambulating and voiding well  Discharge Medications:   Allergies as of 06/24/2017      Reactions   Penicillins Anaphylaxis   Has patient had a PCN reaction causing immediate rash, facial/tongue/throat swelling, SOB or lightheadedness with hypotension: Yes Has patient had a PCN reaction causing severe rash involving mucus membranes or skin necrosis: No Has patient had a PCN reaction that required hospitalization No Has patient had a PCN reaction occurring within the last 10 years: No If all of the above answers are "NO", then may proceed with Cephalosporin use.   Lactose Swelling, Rash   SWELLING REACTION UNSPECIFIED    Chlorhexidine Other (See Comments)   Dermatitis - Allergic   Tape Rash   Plastic tape, silicone tape      Medication List    TAKE these medications   DULoxetine 60 MG capsule Commonly known as:  CYMBALTA Take 2 capsules (120 mg total) daily by mouth.   furosemide 40 MG tablet Commonly known as:  LASIX Take 20 mg by mouth daily.   HYDROcodone-acetaminophen 5-325 MG tablet Commonly known as:  NORCO/VICODIN Take 1 tablet by mouth every 4 (four) hours as needed for moderate pain.   levETIRAcetam 500  MG tablet Commonly known as:  KEPPRA Take 500 mg by mouth 2 (two) times daily.   methadone 5 MG tablet Commonly known as:  DOLOPHINE Take 2.5 mg by mouth every morning.   methazolamide 25 MG tablet Commonly known as:  NEPTAZANE Take 25 mg by mouth 2 (two) times daily.   montelukast 10 MG tablet Commonly known as:  SINGULAIR TAKE 1 TABLET (10 MG TOTAL) BY MOUTH AT BEDTIME.   pramipexole 0.125 MG tablet Commonly known as:  MIRAPEX Take 3 tablets (0.375 mg total) at bedtime by mouth. What changed:    when to take this  additional instructions   Semaglutide  0.25 or 0.5 MG/DOSE Sopn Commonly known as:  OZEMPIC Inject 0.25 mg into the skin once a week for 28 days, THEN 0.5 mg once a week for 28 days. Start taking on:  05/21/2017   simvastatin 20 MG tablet Commonly known as:  ZOCOR TAKE 1 TABLET BY MOUTH  DAILY. FOLLOW UP LABS   tiZANidine 4 MG capsule Commonly known as:  ZANAFLEX Take 4-8 mg by mouth 2 (two) times daily. ONE TAB AM - PRN AND 2 TABS AT BEDTIME   traMADol 50 MG tablet Commonly known as:  ULTRAM Take 50-100 mg by mouth at bedtime.            Discharge Care Instructions  (From admission, onward)        Start     Ordered   06/24/17 0000  Change dressing (specify)    Comments:  Dressing change as needed   06/24/17 3009      Disposition: home   Final Dx: same  Discharge Instructions    Call MD for:  difficulty breathing, headache or visual disturbances   Complete by:  As directed    Call MD for:  extreme fatigue   Complete by:  As directed    Call MD for:  hives   Complete by:  As directed    Call MD for:  persistant dizziness or light-headedness   Complete by:  As directed    Call MD for:  persistant nausea and vomiting   Complete by:  As directed    Call MD for:  redness, tenderness, or signs of infection (pain, swelling, redness, odor or green/yellow discharge around incision site)   Complete by:  As directed    Call MD for:  severe  uncontrolled pain   Complete by:  As directed    Call MD for:  temperature >100.4   Complete by:  As directed    Change dressing (specify)   Complete by:  As directed    Dressing change as needed   Diet - low sodium heart healthy   Complete by:  As directed    Increase activity slowly   Complete by:  As directed          Signed: Ocie Cornfield Fairy Ashlock 06/24/2017, 7:38 AM

## 2017-06-24 NOTE — Progress Notes (Signed)
Discharge instructions (including medications) discussed with and copy provided to patient/caregiver along with paper prescription for norco.

## 2017-07-06 ENCOUNTER — Ambulatory Visit (INDEPENDENT_AMBULATORY_CARE_PROVIDER_SITE_OTHER): Payer: Medicare HMO | Admitting: Family Medicine

## 2017-07-06 ENCOUNTER — Encounter: Payer: Self-pay | Admitting: Family Medicine

## 2017-07-06 VITALS — BP 126/89 | HR 102 | Temp 97.6°F | Ht 63.0 in | Wt 199.0 lb

## 2017-07-06 DIAGNOSIS — R05 Cough: Secondary | ICD-10-CM

## 2017-07-06 DIAGNOSIS — B370 Candidal stomatitis: Secondary | ICD-10-CM | POA: Diagnosis not present

## 2017-07-06 DIAGNOSIS — R059 Cough, unspecified: Secondary | ICD-10-CM

## 2017-07-06 DIAGNOSIS — G935 Compression of brain: Secondary | ICD-10-CM | POA: Diagnosis not present

## 2017-07-06 MED ORDER — NYSTATIN 100000 UNIT/ML MT SUSP
500000.0000 [IU] | Freq: Four times a day (QID) | OROMUCOSAL | 0 refills | Status: DC
Start: 2017-07-06 — End: 2017-07-09

## 2017-07-06 MED ORDER — AZITHROMYCIN 250 MG PO TABS: 250.0000 mg | ORAL_TABLET | Freq: Every day | ORAL | 0 refills | Status: DC

## 2017-07-06 NOTE — Patient Instructions (Signed)
Thank you for coming in today. Take azithromycin.  Use nysatatin swish and swallow.  Recheck as needed.    Oral Thrush, Adult Oral thrush is an infection in your mouth and throat. It causes white patches on your tongue and in your mouth. Follow these instructions at home: Helping with soreness  To lessen your pain: ? Drink cold liquids, like water and iced tea. ? Eat frozen ice pops or frozen juices. ? Eat foods that are easy to swallow, like gelatin and ice cream. ? Drink from a straw if the patches in your mouth are painful. General instructions   Take or use over-the-counter and prescription medicines only as told by your doctor. Medicine for oral thrush may be something to swallow, or it may be something to put on the infected area.  Eat plain yogurt that has live cultures in it. Read the label to make sure.  If you wear dentures: ? Take out your dentures before you go to bed. ? Brush them well. ? Soak them in a denture cleaner.  Rinse your mouth with warm salt-water many times a day. To make the salt-water mixture, completely dissolve 1/2-1 teaspoon of salt in 1 cup of warm water. Contact a doctor if:  Your problems are getting worse.  Your problems do not get better in less than 7 days with treatment.  Your infection is spreading. This may show as white patches on the skin outside of your mouth.  You are nursing your baby and you have redness and pain in the nipples. This information is not intended to replace advice given to you by your health care provider. Make sure you discuss any questions you have with your health care provider. Document Released: 07/29/2009 Document Revised: 01/27/2016 Document Reviewed: 01/27/2016 Elsevier Interactive Patient Education  2017 [employer].

## 2017-07-07 NOTE — Progress Notes (Signed)
Katrina Fernandez is a 52 y.o. female who presents to [facility] today for cough and congestion.  Katrina Fernandez notes a 2-week history of mildly productive cough.  She notes the cough started after she was discharged from the hospital following surgery for VP shunt.  She is feeling much better after her shunt with much less headaches.  She does however note some mild abdominal pain that is improving.  She denies any vomiting or diarrhea.  She is tried some over-the-counter medications for cough which is helped a bit.  She also notes oral thrush following surgery.  She notes that she typically gets this after being exposed to antibiotics.  She notes a white plaque tongue that  is mildly itchy.  She typically does pretty well with nystatin swish and swallow but has not had any recently.  Past Medical History:  Diagnosis Date  . Anemia   . Anxiety   . Arthritis    "back and knees" (07/23/2016)  . Chiari malformation type I ([place]) dx'd 2014  . Chronic back pain    buldging, DDD; "neck and lower back" (07/23/2016)  . Chronic constipation    takes Miralax daily  . Chronic pain syndrome    takes Methadone and Keppra daily  . Depression    takes Effexor daily  . Endometriosis    1995  . Fibromyalgia   . GERD (gastroesophageal reflux disease)    not taking anything at the present time  . History of DVT of lower extremity 07/2012   LEFT LEG --  3/ 2014.Was on a blood transfusion for 6 months  . History of kidney stones   . History of MRSA infection 10 yrs ago  . Hyperlipidemia    takes Simvastatin daily  . Migraine    "daily recently" (07/23/2016)  . Muscle spasm    takes Zanaflex nightly  . Peripheral edema    takes Lasix daily as needed  . Peripheral neuropathy   . Pneumonia    hx of-7-8 yrs ago  . PONV (postoperative nausea and vomiting)   . Seasonal allergies    takes  Claritin daily  . Spinal headache    "since 2002; still get them; try to manage it w/lying flat"  . Weakness    and numbness in right hand   Past Surgical History:  Procedure Laterality Date  . ANTERIOR CERVICAL DECOMP/DISCECTOMY FUSION  07-25-2009   C5 -- C6  . APPENDECTOMY  age 89  . APPLICATION OF CRANIAL NAVIGATION N/A 06/23/2017   Procedure: APPLICATION OF CRANIAL NAVIGATION;  Surgeon: Kary Kos, MD;  Location: [facility];  Service: Neurosurgery;  Laterality: N/A;  . BACK SURGERY    . BREAST REDUCTION SURGERY Bilateral 06/07/2013   Procedure: BILATERAL MAMMARY REDUCTION  (BREAST);  Surgeon: Theodoro Kos, DO;  Location: [facility];  Service: Plastics;  Laterality: Bilateral;  . BREAST REDUCTION SURGERY Bilateral 06/07/2013   Procedure:  LIPOSUCTION;  Surgeon: Theodoro Kos, DO;  Location: [facility];  Service: Plastics;  Laterality: Bilateral;  . CARPAL TUNNEL RELEASE Bilateral 2001  . COLONOSCOPY  X 2   "polyps were benign"  . DILATION AND CURETTAGE OF UTERUS    . ESOPHAGOGASTRODUODENOSCOPY    . gastric banding undone  11/2014  . LAPAROSCOPIC CHOLECYSTECTOMY  ~ 2000  . LAPAROSCOPIC GASTRIC BANDING  2011  . LAPAROSCOPIC REVISION VENTRICULAR-PERITONEAL (V-P) SHUNT N/A 06/23/2017   Procedure: Shunt Placment stereotactic ventricular catheter placement  and general surgery abdominal exposure  (Laparoscopic vs. Open) Brain Lab;  Surgeon: Clovis Riley, MD;  Location: [facility];  Service: General;  Laterality: N/A;  . LAPAROSCOPY N/A 06/23/2017   Procedure: LAPAROSCOPY DIAGNOSTIC;  Surgeon: Clovis Riley, MD;  Location: [facility];  Service: General;  Laterality: N/A;  . LUMBAR LAMINECTOMY/DECOMPRESSION MICRODISCECTOMY N/A 07/23/2016   Procedure: Decompressive Lumbar Laminectomy Lumbar two-three  for Repair of  Cerberal Spinal Fluid  Leak;  Surgeon: Kary Kos, MD;  Location: [facility];  Service: Neurosurgery;  Laterality: N/A;  . PLACEMENT LUMBOPERITONEAL SHUNT FOR  CEREBROSPINAL FLUID DIVERSION AWAY FROM C8 PERINEURAL CYST  09-05-2003  &  12-24-2003   08-31-2003  REMOVAL SHUNT  . POSTERIOR CERVICAL LAMINECTOMY  03-01-2001   C7-8 and Exploration C8 perineural cyst  . POSTERIOR LUMBAR LAMINECTOMY L4-5/  DISKECTOMY L5-S1  05-20-2005  . RE-DO DECOMPRESSION LAMINECTOMY AND FUSION L4-5  &  L5-S1  12-20-2006   02-04-2007--  RE-EXPLORATION L4 to S1, I & D LUMBAR WOUND HEMATOMA  . REDUCTION MAMMAPLASTY    . SHUNT REMOVAL N/A 07/10/2016   Procedure: SHUNT REMOVAL;  Surgeon: Kary Kos, MD;  Location: [facility];  Service: Neurosurgery;  Laterality: N/A;  SHUNT REMOVAL  . SPINAL CORD STIMULATOR INSERTION N/A 04/26/2015   Procedure: LUMBAR SPINAL CORD STIMULATOR INSERTION;  Surgeon: Clydell Hakim, MD;  Location: [facility];  Service: Neurosurgery;  Laterality: N/A;  LUMBAR SPINAL CORD STIMULATOR INSERTION  . TENNIS ELBOW RELEASE/NIRSCHEL PROCEDURE Left 12/13/2014   Procedure: LEFT ELBOW LATERAL EPICONDYLAR DEBRIDEMENT AND OR REPAIR -RECONSTRUCTION ;  Surgeon: Iran Planas, MD;  Location: [facility];  Service: Orthopedics;  Laterality: Left;  . TOTAL ABDOMINAL HYSTERECTOMY  1994   w/  Bilateral Salpinoophorectomy  . VENTRICULOPERITONEAL SHUNT N/A 06/23/2017   Procedure: VENTRICULAR-PERITONEAL SHUNT INSERTION WITH ABDOMINAL LAPARASCOPY AND BRAINLAB NAVIGATION;  Surgeon: Kary Kos, MD;  Location: [facility];  Service: Neurosurgery;  Laterality: N/A;   Social History   Tobacco Use  . Smoking status: Never Smoker  . Smokeless tobacco: Never Used  Substance Use Topics  . Alcohol use: No   family history includes Colon cancer in her father; Depression in her unknown relative; Ovarian cancer in her maternal grandmother; Pancreatic cancer in her maternal grandfather.  ROS as above:  Medications: Current Outpatient Medications  Medication Sig Dispense Refill  . DULoxetine (CYMBALTA) 60 MG capsule Take 2 capsules (120 mg total) daily by mouth. 60 capsule 3  .  furosemide (LASIX) 40 MG tablet Take 20 mg by mouth daily.    Marland Kitchen levETIRAcetam (KEPPRA) 500 MG tablet Take 500 mg by mouth 2 (two) times daily.     . methadone (DOLOPHINE) 5 MG tablet Take 2.5 mg by mouth every morning.   0  . methazolamide (NEPTAZANE) 25 MG tablet Take 25 mg by mouth 2 (two) times daily.   0  . montelukast (SINGULAIR) 10 MG tablet TAKE 1 TABLET (10 MG TOTAL) BY MOUTH AT BEDTIME. 30 tablet 5  . pramipexole (MIRAPEX) 0.125 MG tablet Take 3 tablets (0.375 mg total) at bedtime by mouth. (Patient taking differently: Take 0.375 mg by mouth every evening. Pt needs to take around 1800) 90 tablet 3  . Semaglutide (OZEMPIC) 0.25 or 0.5 MG/DOSE SOPN Inject 0.25 mg into the skin once a week for 28 days, THEN 0.5 mg once a week for 28 days. 4 pen 1  . simvastatin (ZOCOR) 20 MG tablet TAKE 1 TABLET BY MOUTH  DAILY. FOLLOW UP LABS 30 tablet 3  .  tiZANidine (ZANAFLEX) 4 MG capsule Take 4-8 mg by mouth 2 (two) times daily. ONE TAB AM - PRN AND 2 TABS AT BEDTIME    . traMADol (ULTRAM) 50 MG tablet Take 50-100 mg by mouth at bedtime.     Marland Kitchen azithromycin (ZITHROMAX) 250 MG tablet Take 1 tablet (250 mg total) by mouth daily. Take first 2 tablets together, then 1 every day until finished. 6 tablet 0  . nystatin (MYCOSTATIN) 100000 UNIT/ML suspension Take 5 mLs (500,000 Units total) by mouth 4 (four) times daily. Swish for 30 seconds and swallow 180 mL 0   No current facility-administered medications for this visit.    Allergies  Allergen Reactions  . Penicillins Anaphylaxis    Has patient had a PCN reaction causing immediate rash, facial/tongue/throat swelling, SOB or lightheadedness with hypotension: Yes Has patient had a PCN reaction causing severe rash involving mucus membranes or skin necrosis: No Has patient had a PCN reaction that required hospitalization No Has patient had a PCN reaction occurring within the last 10 years: No If all of the above answers are "NO", then may proceed with  Cephalosporin use.   . Lactose Swelling and Rash    SWELLING REACTION UNSPECIFIED   . Chlorhexidine Other (See Comments)    Dermatitis - Allergic  . Tape Rash    Plastic tape, silicone tape    Health Maintenance Health Maintenance  Topic Date Due  . TETANUS/TDAP  05/19/2019  . INFLUENZA VACCINE  Completed  . HIV Screening  Completed     Exam:  BP 126/89   Pulse (!) 102   Temp 97.6 F (36.4 C) (Oral)   Ht 5\' 3"  (1.6 m)   Wt 199 lb (90.3 kg)   BMI 35.25 kg/m  Gen: Well NAD HEENT: EOMI,  MMM clear nasal discharge. Normal TM BL.  White plaque on tongue. Lungs: Normal work of breathing. CTABL Heart: RRR no MRG Abd: NABS, Soft. Nondistended, Nontender Exts: Brisk capillary refill, warm and well perfused.    No results found for this or any previous visit (from the past 72 hour(s)). No results found.    Assessment and Plan: 52 y.o. female with  Cough: Unclear etiology.  Based on her recent history of intubation for surgery preceding her cough I think it is reasonable to treat with antibiotics.,  Will prescribe azithromycin given her allergy to penicillins.  Oral thrush: Treatment with oral nystatin swish and swallow.       No orders of the defined types were placed in this encounter.  Meds ordered this encounter  Medications  . azithromycin (ZITHROMAX) 250 MG tablet    Sig: Take 1 tablet (250 mg total) by mouth daily. Take first 2 tablets together, then 1 every day until finished.    Dispense:  6 tablet    Refill:  0  . nystatin (MYCOSTATIN) 100000 UNIT/ML suspension    Sig: Take 5 mLs (500,000 Units total) by mouth 4 (four) times daily. Swish for 30 seconds and swallow    Dispense:  180 mL    Refill:  0     Discussed warning signs or symptoms. Please see discharge instructions. Patient expresses understanding.

## 2017-07-09 ENCOUNTER — Telehealth: Payer: Self-pay | Admitting: Family Medicine

## 2017-07-09 MED ORDER — CLOTRIMAZOLE 10 MG MT TROC: 10.0000 mg | Freq: Every day | OROMUCOSAL | 1 refills | Status: AC

## 2017-07-09 NOTE — Telephone Encounter (Signed)
Nystatin not available. Will sue clotrimazole troch

## 2017-07-12 ENCOUNTER — Other Ambulatory Visit: Payer: Self-pay | Admitting: Family Medicine

## 2017-07-15 ENCOUNTER — Other Ambulatory Visit: Payer: Self-pay | Admitting: Neurosurgery

## 2017-07-15 DIAGNOSIS — G9600 Cerebrospinal fluid leak, unspecified: Secondary | ICD-10-CM

## 2017-07-16 ENCOUNTER — Encounter: Payer: Self-pay | Admitting: Family Medicine

## 2017-07-20 ENCOUNTER — Encounter: Payer: Self-pay | Admitting: Family Medicine

## 2017-07-20 ENCOUNTER — Ambulatory Visit (INDEPENDENT_AMBULATORY_CARE_PROVIDER_SITE_OTHER): Payer: Medicare HMO | Admitting: Family Medicine

## 2017-07-20 VITALS — BP 128/81 | HR 93 | Temp 97.6°F | Wt 200.0 lb

## 2017-07-20 DIAGNOSIS — R102 Pelvic and perineal pain: Secondary | ICD-10-CM | POA: Diagnosis not present

## 2017-07-20 LAB — POCT URINALYSIS DIP (MANUAL ENTRY)
Bilirubin, UA: NEGATIVE
Blood, UA: NEGATIVE
Glucose, UA: NEGATIVE mg/dL
Ketones, POC UA: NEGATIVE mg/dL
Nitrite, UA: NEGATIVE
Protein Ur, POC: NEGATIVE mg/dL
Spec Grav, UA: 1.015 (ref 1.010–1.025)
Urobilinogen, UA: 0.2 E.U./dL
pH, UA: 7 (ref 5.0–8.0)

## 2017-07-20 MED ORDER — CIPROFLOXACIN HCL 500 MG PO TABS: 500.0000 mg | ORAL_TABLET | Freq: Two times a day (BID) | ORAL | 0 refills | Status: DC

## 2017-07-20 NOTE — Progress Notes (Signed)
Katrina Fernandez is a 52 y.o. female who presents to [facility] today for follow-up abdominal pain.  Katrina Fernandez notes central pelvis pain and pressure present for about a month.  She was seen earlier this month for cough and additionally some mild abdominal discomfort.  She is a pertinent surgical history for shunt revision about a month ago.  She notes that her pelvic pain and pressure worsened with urination.  She denies frequency or urgency.  She notes she additionally has had bilateral oophorectomy and hysterectomy.  She denies any vaginal discharge or bleeding.  She denies any significant change of bowel movements.  No fevers or chills.  Symptoms are moderate to severe and interfering with her ability to take care of herself normally.   Past Medical History:  Diagnosis Date  . Anemia   . Anxiety   . Arthritis    "back and knees" (07/23/2016)  . Chiari malformation type I ([place]) dx'd 2014  . Chronic back pain    buldging, DDD; "neck and lower back" (07/23/2016)  . Chronic constipation    takes Miralax daily  . Chronic pain syndrome    takes Methadone and Keppra daily  . Depression    takes Effexor daily  . Endometriosis    1995  . Fibromyalgia   . GERD (gastroesophageal reflux disease)    not taking anything at the present time  . History of DVT of lower extremity 07/2012   LEFT LEG --  3/ 2014.Was on a blood transfusion for 6 months  . History of kidney stones   . History of MRSA infection 10 yrs ago  . Hyperlipidemia    takes Simvastatin daily  . Migraine    "daily recently" (07/23/2016)  . Muscle spasm    takes Zanaflex nightly  . Peripheral edema    takes Lasix daily as needed  . Peripheral neuropathy   . Pneumonia    hx of-7-8 yrs ago  . PONV (postoperative nausea and vomiting)   . Seasonal allergies    takes Claritin daily  . Spinal headache    "since 2002; still get them; try to manage it w/lying flat"  . Weakness    and numbness in right hand   Past Surgical History:  Procedure Laterality Date  . ANTERIOR CERVICAL DECOMP/DISCECTOMY FUSION  07-25-2009   C5 -- C6  . APPENDECTOMY  age 63  . APPLICATION OF CRANIAL NAVIGATION N/A 06/23/2017   Procedure: APPLICATION OF CRANIAL NAVIGATION;  Surgeon: Kary Kos, MD;  Location: [facility];  Service: Neurosurgery;  Laterality: N/A;  . BACK SURGERY    . BREAST REDUCTION SURGERY Bilateral 06/07/2013   Procedure: BILATERAL MAMMARY REDUCTION  (BREAST);  Surgeon: Theodoro Kos, DO;  Location: [place] [facility];  Service: Plastics;  Laterality: Bilateral;  . BREAST REDUCTION SURGERY Bilateral 06/07/2013   Procedure:  LIPOSUCTION;  Surgeon: Theodoro Kos, DO;  Location: [place];  Service: Plastics;  Laterality: Bilateral;  . CARPAL TUNNEL RELEASE Bilateral 2001  . COLONOSCOPY  X 2   "polyps were benign"  . DILATION AND CURETTAGE OF UTERUS    . ESOPHAGOGASTRODUODENOSCOPY    . gastric banding undone  11/2014  . LAPAROSCOPIC CHOLECYSTECTOMY  ~ 2000  . LAPAROSCOPIC GASTRIC BANDING  2011  . LAPAROSCOPIC REVISION VENTRICULAR-PERITONEAL (V-P) SHUNT N/A 06/23/2017   Procedure: Shunt Placment stereotactic ventricular catheter placement and general surgery abdominal exposure  (Laparoscopic vs. Open) Brain Lab;  Surgeon: Kae Heller,  Victorino Sparrow, MD;  Location: [place];  Service: General;  Laterality: N/A;  . LAPAROSCOPY N/A 06/23/2017   Procedure: LAPAROSCOPY DIAGNOSTIC;  Surgeon: Clovis Riley, MD;  Location: [facility];  Service: General;  Laterality: N/A;  . LUMBAR LAMINECTOMY/DECOMPRESSION MICRODISCECTOMY N/A 07/23/2016   Procedure: Decompressive Lumbar Laminectomy Lumbar two-three  for Repair of  Cerberal Spinal Fluid  Leak;  Surgeon: Kary Kos, MD;  Location: [facility] [place];  Service: Neurosurgery;  Laterality: N/A;  . PLACEMENT LUMBOPERITONEAL SHUNT FOR CEREBROSPINAL FLUID DIVERSION AWAY FROM C8  PERINEURAL CYST  09-05-2003  &  12-24-2003   08-31-2003  REMOVAL SHUNT  . POSTERIOR CERVICAL LAMINECTOMY  03-01-2001   C7-8 and Exploration C8 perineural cyst  . POSTERIOR LUMBAR LAMINECTOMY L4-5/  DISKECTOMY L5-S1  05-20-2005  . RE-DO DECOMPRESSION LAMINECTOMY AND FUSION L4-5  &  L5-S1  12-20-2006   02-04-2007--  RE-EXPLORATION L4 to S1, I & D LUMBAR WOUND HEMATOMA  . REDUCTION MAMMAPLASTY    . SHUNT REMOVAL N/A 07/10/2016   Procedure: SHUNT REMOVAL;  Surgeon: Kary Kos, MD;  Location: [place];  Service: Neurosurgery;  Laterality: N/A;  SHUNT REMOVAL  . SPINAL CORD STIMULATOR INSERTION N/A 04/26/2015   Procedure: LUMBAR SPINAL CORD STIMULATOR INSERTION;  Surgeon: Clydell Hakim, MD;  Location: [facility];  Service: Neurosurgery;  Laterality: N/A;  LUMBAR SPINAL CORD STIMULATOR INSERTION  . TENNIS ELBOW RELEASE/NIRSCHEL PROCEDURE Left 12/13/2014   Procedure: LEFT ELBOW LATERAL EPICONDYLAR DEBRIDEMENT AND OR REPAIR -RECONSTRUCTION ;  Surgeon: Iran Planas, MD;  Location: [facility];  Service: Orthopedics;  Laterality: Left;  . TOTAL ABDOMINAL HYSTERECTOMY  1994   w/  Bilateral Salpinoophorectomy  . VENTRICULOPERITONEAL SHUNT N/A 06/23/2017   Procedure: VENTRICULAR-PERITONEAL SHUNT INSERTION WITH ABDOMINAL LAPARASCOPY AND BRAINLAB NAVIGATION;  Surgeon: Kary Kos, MD;  Location: [facility];  Service: Neurosurgery;  Laterality: N/A;   Social History   Tobacco Use  . Smoking status: Never Smoker  . Smokeless tobacco: Never Used  Substance Use Topics  . Alcohol use: No   family history includes Colon cancer in her father; Depression in her unknown relative; Ovarian cancer in her maternal grandmother; Pancreatic cancer in her maternal grandfather.  ROS as above:  Medications: Current Outpatient Medications  Medication Sig Dispense Refill  . clotrimazole (MYCELEX) 10 MG troche Take 1 tablet (10 mg total) by mouth 5 (five) times daily for 14 days. 70 tablet 1  . DULoxetine (CYMBALTA)  60 MG capsule Take 2 capsules (120 mg total) daily by mouth. 60 capsule 3  . furosemide (LASIX) 40 MG tablet TAKE 1 TABLET BY MOUTH TWICE A DAY 60 tablet 0  . levETIRAcetam (KEPPRA) 500 MG tablet Take 500 mg by mouth 2 (two) times daily.     . methadone (DOLOPHINE) 5 MG tablet Take 2.5 mg by mouth every morning.   0  . montelukast (SINGULAIR) 10 MG tablet TAKE 1 TABLET (10 MG TOTAL) BY MOUTH AT BEDTIME. 30 tablet 5  . pramipexole (MIRAPEX) 0.125 MG tablet Take 3 tablets (0.375 mg total) at bedtime by mouth. (Patient taking differently: Take 0.375 mg by mouth every evening. Pt needs to take around 1800) 90 tablet 3  . simvastatin (ZOCOR) 20 MG tablet TAKE 1 TABLET BY MOUTH  DAILY. FOLLOW UP LABS 30 tablet 3  . tiZANidine (ZANAFLEX) 4 MG capsule Take 4-8 mg by mouth 2 (two) times daily. ONE TAB AM - PRN AND 2 TABS AT BEDTIME    . traMADol (ULTRAM) 50 MG tablet Take 50-100 mg  by mouth at bedtime.     . ciprofloxacin (CIPRO) 500 MG tablet Take 1 tablet (500 mg total) by mouth 2 (two) times daily. 14 tablet 0   No current facility-administered medications for this visit.    Allergies  Allergen Reactions  . Penicillins Anaphylaxis    Has patient had a PCN reaction causing immediate rash, facial/tongue/throat swelling, SOB or lightheadedness with hypotension: Yes Has patient had a PCN reaction causing severe rash involving mucus membranes or skin necrosis: No Has patient had a PCN reaction that required hospitalization No Has patient had a PCN reaction occurring within the last 10 years: No If all of the above answers are "NO", then may proceed with Cephalosporin use.   . Lactose Swelling and Rash    SWELLING REACTION UNSPECIFIED   . Chlorhexidine Other (See Comments)    Dermatitis - Allergic  . Tape Rash    Plastic tape, silicone tape    Health Maintenance Health Maintenance  Topic Date Due  . TETANUS/TDAP  05/19/2019  . INFLUENZA VACCINE  Completed  . HIV Screening  Completed      Exam:  BP 128/81   Pulse 93   Temp 97.6 F (36.4 C) (Oral)   Wt 200 lb (90.7 kg)   BMI 35.43 kg/m  Gen: Well NAD HEENT: EOMI,  MMM Lungs: Normal work of breathing. CTABL Heart: RRR no MRG Abd: NABS, Soft. Nondistended, mildly tender to palpation central lower pelvis.  No rebound or guarding.  Pain is worse when she contracts her abdominal musculature.  No CVA angle tenderness to percussion. Exts: Brisk capillary refill, warm and well perfused.    Results for orders placed or performed in visit on 07/20/17 (from the past 72 hour(s))  POCT urinalysis dipstick     Status: Abnormal   Collection Time: 07/20/17  8:53 AM  Result Value Ref Range   Color, UA yellow yellow   Clarity, UA clear clear   Glucose, UA negative negative mg/dL   Bilirubin, UA negative negative   Ketones, POC UA negative negative mg/dL   Spec Grav, UA 1.015 1.010 - 1.025   Blood, UA negative negative   pH, UA 7.0 5.0 - 8.0   Protein Ur, POC negative negative mg/dL   Urobilinogen, UA 0.2 0.2 or 1.0 E.U./dL   Nitrite, UA Negative Negative   Leukocytes, UA Trace (A) Negative   No results found.    Assessment and Plan: 52 y.o. female with pelvis pain and pressure worse with 1 month status post VP shunt revision.  But the shunt surgery did involve the abdomen and I expect she likely had urinary catheter as part of her surgery.  Urinary tract infection is a possibility.  She does have trace leukocyte esterase on urinalysis today.  Culture pending and will treat empirically with Cipro.  The differential also includes abdominal musculature strain or complication from abdominal portion of the VP shunt insertion. The plan at this point will be if not better obtain basic abdominal labs CBC metabolic panel etc.  Refer to physical therapy per for presumed abdominal wall muscle strain and then obtain CT scan if not better. Patient will update me via phone calls or in person via office visits as needed.   Orders  Placed This Encounter  Procedures  . Urine Culture  . POCT urinalysis dipstick   Meds ordered this encounter  Medications  . ciprofloxacin (CIPRO) 500 MG tablet    Sig: Take 1 tablet (500 mg total) by mouth 2 (two)  times daily.    Dispense:  14 tablet    Refill:  0     Discussed warning signs or symptoms. Please see discharge instructions. Patient expresses understanding.

## 2017-07-20 NOTE — Patient Instructions (Signed)
Thank you for coming in today. Trial of Cipro for possible UTI.  If not better in 1 week next step is Labs and if ok PT for presumed abdominal wall muscle injury.  If not better then CT scan.  Send me an update through [employer].  I recommend against adjusting the dose of Ozempic right now because that medicine can cause typically upper belly pain.   Recheck sooner if needed.  If suddenly worse recheck in person.     Pelvic Pain, Female Pelvic pain is pain in your lower belly (abdomen), below your belly button and between your hips. The pain may start suddenly (acute), keep coming back (recurring), or last a long time (chronic). Pelvic pain that lasts longer than six months is considered chronic. There are many causes of pelvic pain. Sometimes the cause of your pelvic pain is not known. Follow these instructions at home:  Take over-the-counter and prescription medicines only as told by your doctor.  Rest as told by your doctor.  Do not have sex it if hurts.  Keep a journal of your pelvic pain. Write down: ? When the pain started. ? Where the pain is located. ? What seems to make the pain better or worse, such as food or your menstrual cycle. ? Any symptoms you have along with the pain.  Keep all follow-up visits as told by your doctor. This is important. Contact a doctor if:  Medicine does not help your pain.  Your pain comes back.  You have new symptoms.  You have unusual vaginal discharge or bleeding.  You have a fever or chills.  You are having a hard time pooping (constipation).  You have blood in your pee (urine) or poop (stool).  Your pee smells bad.  You feel weak or lightheaded. Get help right away if:  You have sudden pain that is very bad.  Your pain continues to get worse.  You have very bad pain and also have any of the following symptoms: ? A fever. ? Feeling stick to your stomach (nausea). ? Throwing up (vomiting). ? Being very sweaty.  You pass  out (lose consciousness). This information is not intended to replace advice given to you by your health care provider. Make sure you discuss any questions you have with your health care provider. Document Released: 10/21/2007 Document Revised: 05/29/2015 Document Reviewed: 02/22/2015 Elsevier Interactive Patient Education  [employer].

## 2017-07-22 ENCOUNTER — Encounter: Payer: Self-pay | Admitting: Family Medicine

## 2017-07-22 LAB — URINE CULTURE
MICRO NUMBER:: 90283012
SPECIMEN QUALITY:: ADEQUATE

## 2017-07-23 ENCOUNTER — Encounter: Payer: Self-pay | Admitting: Family Medicine

## 2017-07-23 DIAGNOSIS — Z982 Presence of cerebrospinal fluid drainage device: Secondary | ICD-10-CM

## 2017-07-23 DIAGNOSIS — R1031 Right lower quadrant pain: Secondary | ICD-10-CM

## 2017-07-29 ENCOUNTER — Encounter (HOSPITAL_BASED_OUTPATIENT_CLINIC_OR_DEPARTMENT_OTHER): Payer: Self-pay

## 2017-07-29 ENCOUNTER — Ambulatory Visit (HOSPITAL_BASED_OUTPATIENT_CLINIC_OR_DEPARTMENT_OTHER)
Admission: RE | Admit: 2017-07-29 | Discharge: 2017-07-29 | Disposition: A | Discharge status: Home / Self Care | Payer: Medicare HMO | Source: Ambulatory Visit | Attending: Family Medicine | Admitting: Family Medicine

## 2017-07-29 DIAGNOSIS — R1031 Right lower quadrant pain: Secondary | ICD-10-CM | POA: Insufficient documentation

## 2017-07-29 DIAGNOSIS — Z982 Presence of cerebrospinal fluid drainage device: Secondary | ICD-10-CM | POA: Diagnosis not present

## 2017-07-29 DIAGNOSIS — N200 Calculus of kidney: Secondary | ICD-10-CM | POA: Diagnosis not present

## 2017-07-29 DIAGNOSIS — Z9049 Acquired absence of other specified parts of digestive tract: Secondary | ICD-10-CM | POA: Diagnosis not present

## 2017-07-29 MED ORDER — IOPAMIDOL (ISOVUE-300) INJECTION 61%
100.0000 mL | Freq: Once | INTRAVENOUS | Status: AC | PRN
  Administered 2017-07-29: 100 mL via INTRAVENOUS

## 2017-07-30 ENCOUNTER — Encounter: Payer: Self-pay | Admitting: Family Medicine

## 2017-07-30 DIAGNOSIS — N200 Calculus of kidney: Secondary | ICD-10-CM

## 2017-07-30 MED ORDER — TAMSULOSIN HCL 0.4 MG PO CAPS: 0.4000 mg | ORAL_CAPSULE | Freq: Every day | ORAL | 3 refills | Status: DC

## 2017-08-03 DIAGNOSIS — Z880 Allergy status to penicillin: Secondary | ICD-10-CM | POA: Diagnosis not present

## 2017-08-03 DIAGNOSIS — K5900 Constipation, unspecified: Secondary | ICD-10-CM | POA: Diagnosis not present

## 2017-08-03 DIAGNOSIS — R300 Dysuria: Secondary | ICD-10-CM | POA: Diagnosis not present

## 2017-08-03 DIAGNOSIS — R630 Anorexia: Secondary | ICD-10-CM | POA: Diagnosis not present

## 2017-08-03 DIAGNOSIS — Z982 Presence of cerebrospinal fluid drainage device: Secondary | ICD-10-CM | POA: Diagnosis not present

## 2017-08-03 DIAGNOSIS — Z888 Allergy status to other drugs, medicaments and biological substances status: Secondary | ICD-10-CM | POA: Diagnosis not present

## 2017-08-03 DIAGNOSIS — Z86718 Personal history of other venous thrombosis and embolism: Secondary | ICD-10-CM | POA: Diagnosis not present

## 2017-08-03 DIAGNOSIS — R69 Illness, unspecified: Secondary | ICD-10-CM | POA: Diagnosis not present

## 2017-08-03 DIAGNOSIS — R509 Fever, unspecified: Secondary | ICD-10-CM | POA: Diagnosis not present

## 2017-08-03 DIAGNOSIS — R102 Pelvic and perineal pain: Secondary | ICD-10-CM | POA: Diagnosis not present

## 2017-08-03 DIAGNOSIS — Z91048 Other nonmedicinal substance allergy status: Secondary | ICD-10-CM | POA: Diagnosis not present

## 2017-08-03 DIAGNOSIS — E785 Hyperlipidemia, unspecified: Secondary | ICD-10-CM | POA: Diagnosis not present

## 2017-08-03 DIAGNOSIS — R110 Nausea: Secondary | ICD-10-CM | POA: Diagnosis not present

## 2017-08-03 DIAGNOSIS — N200 Calculus of kidney: Secondary | ICD-10-CM | POA: Diagnosis not present

## 2017-08-04 ENCOUNTER — Other Ambulatory Visit: Payer: Self-pay | Admitting: Neurosurgery

## 2017-08-04 DIAGNOSIS — G935 Compression of brain: Secondary | ICD-10-CM

## 2017-08-05 ENCOUNTER — Ambulatory Visit
Admission: RE | Admit: 2017-08-05 | Discharge: 2017-08-05 | Disposition: A | Discharge status: Home / Self Care | Payer: Medicare HMO | Source: Ambulatory Visit | Attending: Neurosurgery | Admitting: Neurosurgery

## 2017-08-05 DIAGNOSIS — G935 Compression of brain: Secondary | ICD-10-CM

## 2017-08-05 DIAGNOSIS — R42 Dizziness and giddiness: Secondary | ICD-10-CM | POA: Diagnosis not present

## 2017-08-08 ENCOUNTER — Other Ambulatory Visit: Payer: Self-pay | Admitting: Family Medicine

## 2017-08-11 ENCOUNTER — Other Ambulatory Visit: Payer: Self-pay | Admitting: Family Medicine

## 2017-08-20 ENCOUNTER — Encounter: Payer: Self-pay | Admitting: Family Medicine

## 2017-08-20 ENCOUNTER — Ambulatory Visit (INDEPENDENT_AMBULATORY_CARE_PROVIDER_SITE_OTHER): Payer: Medicare HMO | Admitting: Family Medicine

## 2017-08-20 ENCOUNTER — Telehealth: Payer: Self-pay

## 2017-08-20 VITALS — BP 103/75 | HR 96 | Ht 63.0 in | Wt 202.0 lb

## 2017-08-20 DIAGNOSIS — R102 Pelvic and perineal pain: Secondary | ICD-10-CM

## 2017-08-20 DIAGNOSIS — T402X5A Adverse effect of other opioids, initial encounter: Secondary | ICD-10-CM | POA: Diagnosis not present

## 2017-08-20 DIAGNOSIS — M79609 Pain in unspecified limb: Secondary | ICD-10-CM | POA: Diagnosis not present

## 2017-08-20 DIAGNOSIS — K5903 Drug induced constipation: Secondary | ICD-10-CM

## 2017-08-20 DIAGNOSIS — M542 Cervicalgia: Secondary | ICD-10-CM | POA: Diagnosis not present

## 2017-08-20 DIAGNOSIS — M7061 Trochanteric bursitis, right hip: Secondary | ICD-10-CM | POA: Diagnosis not present

## 2017-08-20 DIAGNOSIS — M79604 Pain in right leg: Secondary | ICD-10-CM | POA: Diagnosis not present

## 2017-08-20 MED ORDER — LINACLOTIDE 145 MCG PO CAPS: 145.0000 ug | ORAL_CAPSULE | Freq: Every day | ORAL | 1 refills | Status: DC

## 2017-08-20 NOTE — Patient Instructions (Signed)
Thank you for coming in today. We will arrange for an ultrasound.  Try linzess for constipation.  We will also get pelvic PT going as well.  Recheck in a few weeks.  Follow up again with urology.

## 2017-08-20 NOTE — Telephone Encounter (Signed)
Patient called stated that Linzess cost her $200. She is requesting another Rx to be sent to her pharmacy. Rhonda Cunningham,CMA

## 2017-08-20 NOTE — Progress Notes (Signed)
Katrina Fernandez is a 52 y.o. female who presents to [facility]: [facility] today for follow-up right pelvis and abdominal pain.  Colleena has been seen several times for pain in her right lower quadrant of her abdomen or her pelvis.  The pain occurred following revision of her VP shunt.  She has had workup for this including a CT scan with contrast and during the emergency room visit a noncontrast CT scan of her abdomen and pelvis.  The CT scan with contrast did not show significant abnormalities that would explain her pain.  The noncontrast CT scan shows stable appearing renal calculi in the actual kidney without other significant abnormalities.  Additionally she had a right leg duplex ultrasound that did not show blood clot but the report from [facility] mentions a 3.7 cm lymph node in the right groin.  She notes that she continues to have significant pain worse with motion and sometimes changed with eating.  Additionally she has constipation that has been worse recently.  She takes MiraLAX for constipation which does not help.  Additionally she notes that the pain is not consistent with prior episodes of kidney stones.   Past Medical History:  Diagnosis Date  . Anemia   . Anxiety   . Arthritis    "back and knees" (07/23/2016)  . Chiari malformation type I ([place]) dx'd 2014  . Chronic back pain    buldging, DDD; "neck and lower back" (07/23/2016)  . Chronic constipation    takes Miralax daily  . Chronic pain syndrome    takes Methadone and Keppra daily  . Depression    takes Effexor daily  . Endometriosis    1995  . Fibromyalgia   . GERD (gastroesophageal reflux disease)    not taking anything at the present time  . History of DVT of lower extremity 07/2012   LEFT LEG --  3/ 2014.Was on a blood transfusion for 6 months  . History of kidney stones   . History of MRSA infection 10 yrs  ago  . Hyperlipidemia    takes Simvastatin daily  . Migraine    "daily recently" (07/23/2016)  . Muscle spasm    takes Zanaflex nightly  . Peripheral edema    takes Lasix daily as needed  . Peripheral neuropathy   . Pneumonia    hx of-7-8 yrs ago  . PONV (postoperative nausea and vomiting)   . Seasonal allergies    takes Claritin daily  . Spinal headache    "since 2002; still get them; try to manage it w/lying flat"  . Weakness    and numbness in right hand   Past Surgical History:  Procedure Laterality Date  . ANTERIOR CERVICAL DECOMP/DISCECTOMY FUSION  07-25-2009   C5 -- C6  . APPENDECTOMY  age 67  . APPLICATION OF CRANIAL NAVIGATION N/A 06/23/2017   Procedure: APPLICATION OF CRANIAL NAVIGATION;  Surgeon: Kary Kos, MD;  Location: [facility];  Service: Neurosurgery;  Laterality: N/A;  . BACK SURGERY    . BREAST REDUCTION SURGERY Bilateral 06/07/2013   Procedure: BILATERAL MAMMARY REDUCTION  (BREAST);  Surgeon: Theodoro Kos, DO;  Location: [facility];  Service: Plastics;  Laterality: Bilateral;  . BREAST REDUCTION SURGERY Bilateral 06/07/2013   Procedure:  LIPOSUCTION;  Surgeon: Theodoro Kos, DO;  Location: [facility];  Service: Plastics;  Laterality: Bilateral;  . CARPAL TUNNEL RELEASE Bilateral 2001  . COLONOSCOPY  X 2   "  polyps were benign"  . DILATION AND CURETTAGE OF UTERUS    . ESOPHAGOGASTRODUODENOSCOPY    . gastric banding undone  11/2014  . LAPAROSCOPIC CHOLECYSTECTOMY  ~ 2000  . LAPAROSCOPIC GASTRIC BANDING  2011  . LAPAROSCOPIC REVISION VENTRICULAR-PERITONEAL (V-P) SHUNT N/A 06/23/2017   Procedure: Shunt Placment stereotactic ventricular catheter placement and general surgery abdominal exposure  (Laparoscopic vs. Open) Brain Lab;  Surgeon: Clovis Riley, MD;  Location: [facility];  Service: General;  Laterality: N/A;  . LAPAROSCOPY N/A 06/23/2017   Procedure: LAPAROSCOPY DIAGNOSTIC;  Surgeon: Clovis Riley, MD;  Location: [facility];  Service:  General;  Laterality: N/A;  . LUMBAR LAMINECTOMY/DECOMPRESSION MICRODISCECTOMY N/A 07/23/2016   Procedure: Decompressive Lumbar Laminectomy Lumbar two-three  for Repair of  Cerberal Spinal Fluid  Leak;  Surgeon: Kary Kos, MD;  Location: [facility];  Service: Neurosurgery;  Laterality: N/A;  . PLACEMENT LUMBOPERITONEAL SHUNT FOR CEREBROSPINAL FLUID DIVERSION AWAY FROM C8 PERINEURAL CYST  09-05-2003  &  12-24-2003   08-31-2003  REMOVAL SHUNT  . POSTERIOR CERVICAL LAMINECTOMY  03-01-2001   C7-8 and Exploration C8 perineural cyst  . POSTERIOR LUMBAR LAMINECTOMY L4-5/  DISKECTOMY L5-S1  05-20-2005  . RE-DO DECOMPRESSION LAMINECTOMY AND FUSION L4-5  &  L5-S1  12-20-2006   02-04-2007--  RE-EXPLORATION L4 to S1, I & D LUMBAR WOUND HEMATOMA  . REDUCTION MAMMAPLASTY    . SHUNT REMOVAL N/A 07/10/2016   Procedure: SHUNT REMOVAL;  Surgeon: Kary Kos, MD;  Location: [facility];  Service: Neurosurgery;  Laterality: N/A;  SHUNT REMOVAL  . SPINAL CORD STIMULATOR INSERTION N/A 04/26/2015   Procedure: LUMBAR SPINAL CORD STIMULATOR INSERTION;  Surgeon: Clydell Hakim, MD;  Location: [facility];  Service: Neurosurgery;  Laterality: N/A;  LUMBAR SPINAL CORD STIMULATOR INSERTION  . TENNIS ELBOW RELEASE/NIRSCHEL PROCEDURE Left 12/13/2014   Procedure: LEFT ELBOW LATERAL EPICONDYLAR DEBRIDEMENT AND OR REPAIR -RECONSTRUCTION ;  Surgeon: Iran Planas, MD;  Location: [facility];  Service: Orthopedics;  Laterality: Left;  . TOTAL ABDOMINAL HYSTERECTOMY  1994   w/  Bilateral Salpinoophorectomy  . VENTRICULOPERITONEAL SHUNT N/A 06/23/2017   Procedure: VENTRICULAR-PERITONEAL SHUNT INSERTION WITH ABDOMINAL LAPARASCOPY AND BRAINLAB NAVIGATION;  Surgeon: Kary Kos, MD;  Location: [facility];  Service: Neurosurgery;  Laterality: N/A;   Social History   Tobacco Use  . Smoking status: Never Smoker  . Smokeless tobacco: Never Used  Substance Use Topics  . Alcohol use: No   family history includes Colon cancer in her father;  Depression in her unknown relative; Ovarian cancer in her maternal grandmother; Pancreatic cancer in her maternal grandfather.  ROS as above:  Medications: Current Outpatient Medications  Medication Sig Dispense Refill  . DULoxetine (CYMBALTA) 60 MG capsule Take 2 capsules (120 mg total) daily by mouth. 60 capsule 3  . furosemide (LASIX) 40 MG tablet TAKE 1 TABLET BY MOUTH TWICE A DAY 60 tablet 0  . levETIRAcetam (KEPPRA) 500 MG tablet Take 500 mg by mouth 2 (two) times daily.     . methadone (DOLOPHINE) 5 MG tablet Take 2.5 mg by mouth every morning.   0  . montelukast (SINGULAIR) 10 MG tablet TAKE 1 TABLET (10 MG TOTAL) BY MOUTH AT BEDTIME. 30 tablet 5  . pramipexole (MIRAPEX) 0.125 MG tablet TAKE 3 TABLETS (0.375 MG TOTAL) AT BEDTIME BY MOUTH. 90 tablet 3  . simvastatin (ZOCOR) 20 MG tablet TAKE 1 TABLET BY MOUTH DAILY. 30 tablet 3  . tiZANidine (ZANAFLEX) 4 MG capsule Take 4-8 mg by mouth 2 (  two) times daily. ONE TAB AM - PRN AND 2 TABS AT BEDTIME    . traMADol (ULTRAM) 50 MG tablet Take 50-100 mg by mouth at bedtime.     Marland Kitchen linaclotide (LINZESS) 145 MCG CAPS capsule Take 1 capsule (145 mcg total) by mouth daily before breakfast. 90 capsule 1   No current facility-administered medications for this visit.    Allergies  Allergen Reactions  . Penicillins Anaphylaxis    Has patient had a PCN reaction causing immediate rash, facial/tongue/throat swelling, SOB or lightheadedness with hypotension: Yes Has patient had a PCN reaction causing severe rash involving mucus membranes or skin necrosis: No Has patient had a PCN reaction that required hospitalization No Has patient had a PCN reaction occurring within the last 10 years: No If all of the above answers are "NO", then may proceed with Cephalosporin use.   . Lactose Swelling and Rash    SWELLING REACTION UNSPECIFIED   . Chlorhexidine Other (See Comments)    Dermatitis - Allergic  . Tape Rash    Plastic tape, silicone tape    Health  Maintenance Health Maintenance  Topic Date Due  . INFLUENZA VACCINE  12/16/2017  . TETANUS/TDAP  05/19/2019  . HIV Screening  Completed     Exam:  BP 103/75   Pulse 96   Ht 5\' 3"  (1.6 m)   Wt 202 lb (91.6 kg)   BMI 35.78 kg/m  Gen: Well NAD HEENT: EOMI,  MMM Lungs: Normal work of breathing. CTABL Heart: RRR no MRG Abd: NABS, Soft. Nondistended, well-appearing mature scar right lower quadrant.  Patient is tender to palpation overlying this scar with no palpable herniations or defects.  No rebound or guarding.  Fullness present in the right lower quadrant of the abdomen and pelvis without discrete mass palpated. Exts: Brisk capillary refill, warm and well perfused.   TECHNIQUE: The veins of both lower extremities were interrogated from the visible common femoral vein to the distal popliteal vein.  The junction of the greater saphenous with the common femoral vein as well as the posterior tibial veins were evaluated.   Gray scale, color, spectral, and doppler sonography was utilized and analyzed.  COMPARISON: June 19, 2013   INDICATION: R groin pain,  FINDINGS: Flow is present in all interrogated vessels and there are no internal echoes.  Normal venous compressibility is demonstrated and there is augmentation of flow with appropriate maneuvers.    INCIDENTAL FINDINGS:  Groin lymph node measures 3.7 cm   IMPRESSION:  No evidence of deep venous thrombosis.  Electronically Signed by: Ritta Slot  Acute Interface, Incoming Rad Results - 08/03/2017  6:13 PM EDT TECHNIQUE: Abdomen pelvic CT without contrast. Multiple contiguous axial images obtained. Comparison study 07/24/2011.  HISTORY: Right flank and pelvic pain. Dysuria. Previous appendectomy.  FINDINGS: Limited study due to absence of IV contrast. Liver, spleen, pancreas, and adrenal glands unremarkable. Small bilateral renal stones. LAP-BAND removed. Shunt tube in place. Nonspecific bowel gas pattern. Postoperative and  degenerative changes  lumbar spine with metallic artifact obscuring adjacent structures. Spinal stimulator in place.  Absent uterus. No adnexal mass. Bladder unremarkable.   IMPRESSION: 1. Small bilateral renal stones. No hydronephrosis or ureteral stone identified. 2. AP shunt tube in place. 3. Postoperative and degenerative changes lumbar spine.  Electronically Signed by: Dorthula Perfect  EXAM: CT ABDOMEN AND PELVIS WITH CONTRAST  TECHNIQUE: Multidetector CT imaging of the abdomen and pelvis was performed using the standard protocol following bolus administration of intravenous contrast.  CONTRAST:  168mL ISOVUE-300 IOPAMIDOL (ISOVUE-300) INJECTION 61%  COMPARISON:  June 28, 2012  FINDINGS: Lower chest: Mild dependent atelectasis of right lung base is noted. There is no focal pneumonia. There is no pleural effusion. The heart size is normal.  Hepatobiliary: No focal liver abnormality is seen. Status post cholecystectomy. No biliary dilatation.  Pancreas: Unremarkable. No pancreatic ductal dilatation or surrounding inflammatory changes.  Spleen: Normal in size without focal abnormality.  Adrenals/Urinary Tract: The adrenal glands are normal. There is no hydronephrosis bilaterally. There are nonobstructing stones in bilateral kidneys, largest measures 6 mm in the lower pole left kidney. The bladder is normal.  Stomach/Bowel: Stomach is within normal limits. Patient status post prior appendectomy. No evidence of bowel wall thickening, distention, or inflammatory changes.  Vascular/Lymphatic: No significant vascular findings are present. No enlarged abdominal or pelvic lymph nodes.  Reproductive: Status post hysterectomy. No adnexal masses.  Other: VP shunt tube is identified distal tip in the pelvis. There is no free air or free fluid identified in the abdomen and pelvis.  Musculoskeletal: Prior fixation of the mid to lower lumbar spine  are noted.  IMPRESSION: VP shunt tube is identified distal tip in the pelvis. There is no free air or free fluid identified in the abdomen and pelvis.  No acute abnormality identified in the abdomen and pelvis  Bilateral nephrolithiasis.  Prior cholecystectomy and appendectomy.   Electronically Signed   By: Abelardo Diesel M.D.   On: 07/29/2017 11:48  No results found for this or any previous visit (from the past 72 hour(s)). No results found.    Assessment and Plan: 52 y.o. female with right lower quadrant abdominal pain or right pelvis pain.  Unclear etiology at this point.  The differential is broad.  I am concerned she may have adhesions following her surgery causing abdominal pain.  She has had multiple abdominal surgeries and is high risk for this.  Fortunately she has had an appendectomy as well as hysterectomy and oophorectomy which rules out some causes of pain.  I am concerned about the large lymph node mentioned on the ultrasound scan from [facility].  Do not palpate a large lymph node on my exam today and I this large lymph node is not visible on the recent CT scans.  However this cannot go without further evaluation.  Plan to repeat pelvis ultrasounds to evaluate for lymph nodes.  I discussed the plan with the ultrasound tech.    Additionally will start treatment with pelvic physical therapy which can certainly help in situations like this.  Lastly patient has constipation.  This could certainly be a contribution factor.  Plan on trial of Linzess.    Orders Placed This Encounter  Procedures  . US PELVIS (TRANSABDOMINAL ONLY)    Order Specific Question:   Reason for exam:    Answer:   Eval right pelvis pain.  Previous ultrasound at outside hospital showed large lymph node.  This was not seen on CT scan.    Order Specific Question:   Preferred imaging location?    Answer:   [facility]  . US Transvaginal Non-OB    Standing Status:   Future    Standing  Expiration Date:   10/21/2018    Order Specific Question:   Reason for Exam (SYMPTOM  OR DIAGNOSIS REQUIRED)    Answer:   Eval right pelvis pain.  Previous ultrasound at outside hospital concerning for large lymph node in the right pelvis not seen on CT scan.  Order Specific Question:   Preferred imaging location?    Answer:   [facility]  . Ambulatory referral to Physical Therapy    Referral Priority:   Routine    Referral Type:   Physical Medicine    Referral Reason:   Specialty Services Required    Requested Specialty:   Physical Therapy   Meds ordered this encounter  Medications  . linaclotide (LINZESS) 145 MCG CAPS capsule    Sig: Take 1 capsule (145 mcg total) by mouth daily before breakfast.    Dispense:  90 capsule    Refill:  1    Constipation filled miralax and senna and colace     Discussed warning signs or symptoms. Please see discharge instructions. Patient expresses understanding.

## 2017-08-23 ENCOUNTER — Ambulatory Visit (INDEPENDENT_AMBULATORY_CARE_PROVIDER_SITE_OTHER): Payer: Medicare HMO

## 2017-08-23 DIAGNOSIS — Z90710 Acquired absence of both cervix and uterus: Secondary | ICD-10-CM

## 2017-08-23 DIAGNOSIS — R102 Pelvic and perineal pain: Secondary | ICD-10-CM | POA: Diagnosis not present

## 2017-08-23 MED ORDER — LUBIPROSTONE 24 MCG PO CAPS: 24.0000 ug | ORAL_CAPSULE | Freq: Two times a day (BID) | ORAL | 3 refills | Status: DC

## 2017-08-23 NOTE — Telephone Encounter (Signed)
We will try Amitiza as that looks like it is Tier 1 for you.

## 2017-08-30 ENCOUNTER — Ambulatory Visit: Payer: Medicare Other | Admitting: Family Medicine

## 2017-09-06 ENCOUNTER — Telehealth: Payer: Self-pay | Admitting: Emergency Medicine

## 2017-09-06 NOTE — Telephone Encounter (Signed)
We can increase the dose. Would she like me to do that?

## 2017-09-06 NOTE — Telephone Encounter (Signed)
Patient states her restless leg is no longer helped by pramipexole; is there another med she can try?

## 2017-09-08 ENCOUNTER — Other Ambulatory Visit: Payer: Self-pay | Admitting: Family Medicine

## 2017-09-08 MED ORDER — PRAMIPEXOLE DIHYDROCHLORIDE 0.5 MG PO TABS: 0.5000 mg | ORAL_TABLET | Freq: Every day | ORAL | 0 refills | Status: DC

## 2017-09-08 NOTE — Telephone Encounter (Signed)
Dose increase to 1 0.5mg  pill at at night. New rx sent to [facility].

## 2017-09-08 NOTE — Addendum Note (Signed)
Addended by: Gregor Hams on: 09/08/2017 07:48 PM   Modules accepted: Orders

## 2017-09-08 NOTE — Telephone Encounter (Signed)
Pt called and stated she would like for you to increase the dose of the pramipexole to see if that will work. She would like it sent to the [facility] here In [place]. Thanks

## 2017-09-09 DIAGNOSIS — G960 Cerebrospinal fluid leak: Secondary | ICD-10-CM | POA: Diagnosis not present

## 2017-09-09 DIAGNOSIS — G935 Compression of brain: Secondary | ICD-10-CM | POA: Diagnosis not present

## 2017-09-09 DIAGNOSIS — R51 Headache: Secondary | ICD-10-CM | POA: Diagnosis not present

## 2017-09-09 NOTE — Telephone Encounter (Signed)
Patient advised.

## 2017-09-10 NOTE — Telephone Encounter (Signed)
Pharmacy called to see if you want patient to continue on furosemide.

## 2017-09-13 NOTE — Telephone Encounter (Signed)
Medicine we refilled.

## 2017-09-16 NOTE — Telephone Encounter (Signed)
Hello [name] I am just seeing this message. I don't know if you just replied or it was just forwared to be. Either way I sent a prescription for A[facility] [place] on 4/8  How is your pain doing?  Katrina Fernandez

## 2017-09-30 ENCOUNTER — Other Ambulatory Visit: Payer: Self-pay | Admitting: Family Medicine

## 2017-10-05 DIAGNOSIS — G960 Cerebrospinal fluid leak: Secondary | ICD-10-CM | POA: Diagnosis not present

## 2017-10-05 DIAGNOSIS — G935 Compression of brain: Secondary | ICD-10-CM | POA: Diagnosis not present

## 2017-10-20 ENCOUNTER — Other Ambulatory Visit: Payer: Self-pay | Admitting: Family Medicine

## 2017-10-25 ENCOUNTER — Ambulatory Visit (INDEPENDENT_AMBULATORY_CARE_PROVIDER_SITE_OTHER): Payer: Medicare HMO | Admitting: Family Medicine

## 2017-10-25 ENCOUNTER — Encounter: Payer: Self-pay | Admitting: Family Medicine

## 2017-10-25 VITALS — BP 127/84 | HR 90 | Ht 63.0 in | Wt 192.0 lb

## 2017-10-25 DIAGNOSIS — Z6834 Body mass index (BMI) 34.0-34.9, adult: Secondary | ICD-10-CM | POA: Diagnosis not present

## 2017-10-25 DIAGNOSIS — M7061 Trochanteric bursitis, right hip: Secondary | ICD-10-CM

## 2017-10-25 DIAGNOSIS — E6609 Other obesity due to excess calories: Secondary | ICD-10-CM

## 2017-10-25 MED ORDER — SEMAGLUTIDE (1 MG/DOSE) 2 MG/1.5ML ~~LOC~~ SOPN: 1.0000 mg | PEN_INJECTOR | SUBCUTANEOUS | 12 refills | Status: DC

## 2017-10-25 NOTE — Progress Notes (Signed)
Katrina Fernandez is a 52 y.o. female who presents to [facility] today for right hip pain.  Kolbee notes a several month history of right hip pain. She was seen by my partner Dr T in January of 2019 for right lateral hip pain. She was diagnosed with trochanteric bursitis and received a trochanteric bursitis injection. She notes that the injection helped for a few weeks to months. She had instructions for home exercise plan however she was not able to continue the exercises due to her brain surgery and her daughter's health issues. She notes that her life has calmed down a bit and she is interested in increasing her activity level. She notes that her right lateral hip has been worsening over the last month or so. The pain is worse with activity and when laying on her right side.   Obesity: Doing well with improved diet and now mild exercise. [name] is taking Ozempic0.5mg   for history of hyperglycemia and obesity. She notes it helps but would like to increase the dose.    ROS:  As above  Exam:  BP 127/84   Pulse 90   Ht 5\' 3"  (1.6 m)   Wt 192 lb (87.1 kg)   BMI 34.01 kg/m   Wt Readings from Last 5 Encounters:  10/25/17 192 lb (87.1 kg)  08/20/17 202 lb (91.6 kg)  07/20/17 200 lb (90.7 kg)  07/06/17 199 lb (90.3 kg)  06/23/17 199 lb 8 oz (90.5 kg)    General: Well Developed, well nourished, and in no acute distress.  Neuro/Psych: Alert and oriented x3, extra-ocular muscles intact, able to move all 4 extremities, sensation grossly intact. Skin: Warm and dry, no rashes noted.  Respiratory: Not using accessory muscles, speaking in full sentences, trachea midline.  Cardiovascular: Pulses palpable, no extremity edema. Abdomen: Does not appear distended. MSK:  Right hip: Normal appearing.  Normal ROM TTP greater trochanter.  Hip abduction strength is 4/5 Antalgic gait.   Hip greater trochanteric injection: right lateral hip Consent  obtained and timeout performed. Area of maximum tenderness palpated and identified. Skin cleaned with alcohol, cold spray applied. A spinal needle was used to access the greater trochanteric bursa. 80 mg of Depo-Medrol, and 3 mL of Marcaine were used to inject the trochanteric bursa. Patient tolerated the procedure well.   Lab and Radiology Results CT scan abd/pelvis March 2019 images reviewed of the right hip.  Mild DJD no acute changes.   Assessment and Plan: 52 y.o. female with right lateral hip pain due to trochanteric bursitis.  Plan to treat with injection and home exercise program.  Recheck in the near future.   Obesity/hyperglycemia: Improving. Will increase Ozempic to 1mg .  Recheck in the near future.     No orders of the defined types were placed in this encounter.  Meds ordered this encounter  Medications  . Semaglutide (OZEMPIC) 1 MG/DOSE SOPN    Sig: Inject 1 mg into the skin once a week.    Dispense:  4 pen    Refill:  12    Historical information moved to improve visibility of documentation.  Past Medical History:  Diagnosis Date  . Anemia   . Anxiety   . Arthritis    "back and knees" (07/23/2016)  . Chiari malformation type I ([place]) dx'd 2014  . Chronic back pain    buldging, DDD; "neck and lower back" (07/23/2016)  . Chronic constipation    takes Miralax daily  . Chronic  pain syndrome    takes Methadone and Keppra daily  . Depression    takes Effexor daily  . Endometriosis    1995  . Fibromyalgia   . GERD (gastroesophageal reflux disease)    not taking anything at the present time  . History of DVT of lower extremity 07/2012   LEFT LEG --  3/ 2014.Was on a blood transfusion for 6 months  . History of kidney stones   . History of MRSA infection 10 yrs ago  . Hyperlipidemia    takes Simvastatin daily  . Migraine    "daily recently" (07/23/2016)  . Muscle spasm    takes Zanaflex nightly  . Peripheral edema    takes Lasix daily as needed  .  Peripheral neuropathy   . Pneumonia    hx of-7-8 yrs ago  . PONV (postoperative nausea and vomiting)   . Seasonal allergies    takes Claritin daily  . Spinal headache    "since 2002; still get them; try to manage it w/lying flat"  . Weakness    and numbness in right hand   Past Surgical History:  Procedure Laterality Date  . ANTERIOR CERVICAL DECOMP/DISCECTOMY FUSION  07-25-2009   C5 -- C6  . APPENDECTOMY  age 49  . APPLICATION OF CRANIAL NAVIGATION N/A 06/23/2017   Procedure: APPLICATION OF CRANIAL NAVIGATION;  Surgeon: Kary Kos, MD;  Location: [facility];  Service: Neurosurgery;  Laterality: N/A;  . BACK SURGERY    . BREAST REDUCTION SURGERY Bilateral 06/07/2013   Procedure: BILATERAL MAMMARY REDUCTION  (BREAST);  Surgeon: Theodoro Kos, DO;  Location: [facility];  Service: Plastics;  Laterality: Bilateral;  . BREAST REDUCTION SURGERY Bilateral 06/07/2013   Procedure:  LIPOSUCTION;  Surgeon: Theodoro Kos, DO;  Location: [facility];  Service: Plastics;  Laterality: Bilateral;  . CARPAL TUNNEL RELEASE Bilateral 2001  . COLONOSCOPY  X 2   "polyps were benign"  . DILATION AND CURETTAGE OF UTERUS    . ESOPHAGOGASTRODUODENOSCOPY    . gastric banding undone  11/2014  . LAPAROSCOPIC CHOLECYSTECTOMY  ~ 2000  . LAPAROSCOPIC GASTRIC BANDING  2011  . LAPAROSCOPIC REVISION VENTRICULAR-PERITONEAL (V-P) SHUNT N/A 06/23/2017   Procedure: Shunt Placment stereotactic ventricular catheter placement and general surgery abdominal exposure  (Laparoscopic vs. Open) Brain Lab;  Surgeon: Clovis Riley, MD;  Location: [facility];  Service: General;  Laterality: N/A;  . LAPAROSCOPY N/A 06/23/2017   Procedure: LAPAROSCOPY DIAGNOSTIC;  Surgeon: Clovis Riley, MD;  Location: [facility];  Service: General;  Laterality: N/A;  . LUMBAR LAMINECTOMY/DECOMPRESSION MICRODISCECTOMY N/A 07/23/2016   Procedure: Decompressive Lumbar Laminectomy Lumbar two-three  for Repair of  Cerberal Spinal Fluid   Leak;  Surgeon: Kary Kos, MD;  Location: [facility];  Service: Neurosurgery;  Laterality: N/A;  . PLACEMENT LUMBOPERITONEAL SHUNT FOR CEREBROSPINAL FLUID DIVERSION AWAY FROM C8 PERINEURAL CYST  09-05-2003  &  12-24-2003   08-31-2003  REMOVAL SHUNT  . POSTERIOR CERVICAL LAMINECTOMY  03-01-2001   C7-8 and Exploration C8 perineural cyst  . POSTERIOR LUMBAR LAMINECTOMY L4-5/  DISKECTOMY L5-S1  05-20-2005  . RE-DO DECOMPRESSION LAMINECTOMY AND FUSION L4-5  &  L5-S1  12-20-2006   02-04-2007--  RE-EXPLORATION L4 to S1, I & D LUMBAR WOUND HEMATOMA  . REDUCTION MAMMAPLASTY    . SHUNT REMOVAL N/A 07/10/2016   Procedure: SHUNT REMOVAL;  Surgeon: Kary Kos, MD;  Location: [facility];  Service: Neurosurgery;  Laterality: N/A;  SHUNT REMOVAL  . SPINAL CORD STIMULATOR INSERTION  N/A 04/26/2015   Procedure: LUMBAR SPINAL CORD STIMULATOR INSERTION;  Surgeon: Clydell Hakim, MD;  Location: [facility];  Service: Neurosurgery;  Laterality: N/A;  LUMBAR SPINAL CORD STIMULATOR INSERTION  . TENNIS ELBOW RELEASE/NIRSCHEL PROCEDURE Left 12/13/2014   Procedure: LEFT ELBOW LATERAL EPICONDYLAR DEBRIDEMENT AND OR REPAIR -RECONSTRUCTION ;  Surgeon: Iran Planas, MD;  Location: [facility];  Service: Orthopedics;  Laterality: Left;  . TOTAL ABDOMINAL HYSTERECTOMY  1994   w/  Bilateral Salpinoophorectomy  . VENTRICULOPERITONEAL SHUNT N/A 06/23/2017   Procedure: VENTRICULAR-PERITONEAL SHUNT INSERTION WITH ABDOMINAL LAPARASCOPY AND BRAINLAB NAVIGATION;  Surgeon: Kary Kos, MD;  Location: [facility];  Service: Neurosurgery;  Laterality: N/A;   Social History   Tobacco Use  . Smoking status: Never Smoker  . Smokeless tobacco: Never Used  Substance Use Topics  . Alcohol use: No   family history includes Colon cancer in her father; Depression in her unknown relative; Ovarian cancer in her maternal grandmother; Pancreatic cancer in her maternal grandfather.  Medications: Current Outpatient Medications  Medication Sig Dispense  Refill  . DULoxetine (CYMBALTA) 60 MG capsule Take 2 capsules (120 mg total) daily by mouth. 60 capsule 3  . furosemide (LASIX) 40 MG tablet TAKE 1 TABLET BY MOUTH TWICE A DAY 60 tablet 0  . levETIRAcetam (KEPPRA) 500 MG tablet Take 500 mg by mouth 2 (two) times daily.     Marland Kitchen lubiprostone (AMITIZA) 24 MCG capsule Take 1 capsule (24 mcg total) by mouth 2 (two) times daily with a meal. 60 capsule 3  . methadone (DOLOPHINE) 5 MG tablet Take 2.5 mg by mouth every morning.   0  . montelukast (SINGULAIR) 10 MG tablet TAKE 1 TABLET (10 MG TOTAL) BY MOUTH AT BEDTIME. 30 tablet 5  . nystatin (MYCOSTATIN) 100000 UNIT/ML suspension TAKE 5 MLS (500,000 UNITS TOTAL) BY MOUTH 4 (FOUR) TIMES DAILY. SWISH FOR 30 SECONDS AND SWALLOW 180 mL 0  . pramipexole (MIRAPEX) 0.5 MG tablet Take 1 tablet (0.5 mg total) by mouth at bedtime. 90 tablet 0  . simvastatin (ZOCOR) 20 MG tablet TAKE 1 TABLET BY MOUTH DAILY. 30 tablet 3  . tamsulosin (FLOMAX) 0.4 MG CAPS capsule TAKE 1 CAPSULE BY MOUTH EVERY DAY 30 capsule 2  . tiZANidine (ZANAFLEX) 4 MG capsule Take 4-8 mg by mouth 2 (two) times daily. ONE TAB AM - PRN AND 2 TABS AT BEDTIME    . traMADol (ULTRAM) 50 MG tablet Take 50-100 mg by mouth at bedtime.     . Semaglutide (OZEMPIC) 1 MG/DOSE SOPN Inject 1 mg into the skin once a week. 4 pen 12   No current facility-administered medications for this visit.    Allergies  Allergen Reactions  . Penicillins Anaphylaxis    Has patient had a PCN reaction causing immediate rash, facial/tongue/throat swelling, SOB or lightheadedness with hypotension: Yes Has patient had a PCN reaction causing severe rash involving mucus membranes or skin necrosis: No Has patient had a PCN reaction that required hospitalization No Has patient had a PCN reaction occurring within the last 10 years: No If all of the above answers are "NO", then may proceed with Cephalosporin use.   . Lactose Swelling and Rash    SWELLING REACTION UNSPECIFIED   .  Chlorhexidine Other (See Comments)    Dermatitis - Allergic  . Tape Rash    Plastic tape, silicone tape      Discussed warning signs or symptoms. Please see discharge instructions. Patient expresses understanding.

## 2017-10-25 NOTE — Patient Instructions (Addendum)
Thank you for coming in today. Work on hip abduction strength.   Cross over stretch Figure 4 stretch Side leg raise.  30 reps 2x daily.   Call or go to the ER if you develop a large red swollen joint with extreme pain or oozing puss.   Increase dose of ozempic.    Trochanteric Bursitis Rehab Ask your health care provider which exercises are safe for you. Do exercises exactly as told by your health care provider and adjust them as directed. It is normal to feel mild stretching, pulling, tightness, or discomfort as you do these exercises, but you should stop right away if you feel sudden pain or your pain gets worse.Do not begin these exercises until told by your health care provider. Stretching exercises These exercises warm up your muscles and joints and improve the movement and flexibility of your hip. These exercises also help to relieve pain and stiffness. Exercise A: Iliotibial band stretch  1. Lie on your side with your left / right leg in the top position. 2. Bend your left / right knee and grab your ankle. 3. Slowly bring your knee back so your thigh is behind your body. 4. Slowly lower your knee toward the floor until you feel a gentle stretch on the outside of your left / right thigh. If you do not feel a stretch and your knee will not fall farther, place the heel of your other foot on top of your outer knee and pull your thigh down farther. 5. Hold this position for __________ seconds. 6. Slowly return to the starting position. Repeat __________ times. Complete this exercise __________ times a day. Strengthening exercises These exercises build strength and endurance in your hip and pelvis. Endurance is the ability to use your muscles for a long time, even after they get tired. Exercise B: Bridge ( hip extensors) 1. Lie on your back on a firm surface with your knees bent and your feet flat on the floor. 2. Tighten your buttocks muscles and lift your buttocks off the floor  until your trunk is level with your thighs. You should feel the muscles working in your buttocks and the back of your thighs. If this exercise is too easy, try doing it with your arms crossed over your chest. 3. Hold this position for __________ seconds. 4. Slowly return to the starting position. 5. Let your muscles relax completely between repetitions. Repeat __________ times. Complete this exercise __________ times a day. Exercise C: Squats ( knee extensors and  quadriceps) 1. Stand in front of a table, with your feet and knees pointing straight ahead. You may rest your hands on the table for balance but not for support. 2. Slowly bend your knees and lower your hips like you are going to sit in a chair. ? Keep your weight over your heels, not over your toes. ? Keep your lower legs upright so they are parallel with the table legs. ? Do not let your hips go lower than your knees. ? Do not bend lower than told by your health care provider. ? If your hip pain increases, do not bend as low. 3. Hold this position for __________ seconds. 4. Slowly push with your legs to return to standing. Do not use your hands to pull yourself to standing. Repeat __________ times. Complete this exercise __________ times a day. Exercise D: Hip hike 1. Stand sideways on a bottom step. Stand on your left / right leg with your other foot unsupported next to  the step. You can hold onto the railing or wall if needed for balance. 2. Keeping your knees straight and your torso square, lift your left / right hip up toward the ceiling. 3. Hold this position for __________ seconds. 4. Slowly let your left / right hip lower toward the floor, past the starting position. Your foot should get closer to the floor. Do not lean or bend your knees. Repeat __________ times. Complete this exercise __________ times a day. Exercise E: Single leg stand 1. Stand near a counter or door frame that you can hold onto for balance as needed. It  is helpful to stand in front of a mirror for this exercise so you can watch your hip. 2. Squeeze your left / right buttock muscles then lift up your other foot. Do not let your left / right hip push out to the side. 3. Hold this position for __________ seconds. Repeat __________ times. Complete this exercise __________ times a day. This information is not intended to replace advice given to you by your health care provider. Make sure you discuss any questions you have with your health care provider. Document Released: 06/11/2004 Document Revised: 01/09/2016 Document Reviewed: 04/19/2015 Elsevier Interactive Patient Education  [employer].

## 2017-10-27 ENCOUNTER — Other Ambulatory Visit: Payer: Self-pay | Admitting: Family Medicine

## 2017-11-04 ENCOUNTER — Other Ambulatory Visit: Payer: Self-pay | Admitting: Family Medicine

## 2017-11-19 DIAGNOSIS — G894 Chronic pain syndrome: Secondary | ICD-10-CM | POA: Diagnosis not present

## 2017-11-19 DIAGNOSIS — M5440 Lumbago with sciatica, unspecified side: Secondary | ICD-10-CM | POA: Diagnosis not present

## 2017-11-19 DIAGNOSIS — Z79899 Other long term (current) drug therapy: Secondary | ICD-10-CM | POA: Diagnosis not present

## 2017-11-19 DIAGNOSIS — M961 Postlaminectomy syndrome, not elsewhere classified: Secondary | ICD-10-CM | POA: Diagnosis not present

## 2017-11-19 DIAGNOSIS — M542 Cervicalgia: Secondary | ICD-10-CM | POA: Diagnosis not present

## 2017-11-19 DIAGNOSIS — Z5181 Encounter for therapeutic drug level monitoring: Secondary | ICD-10-CM | POA: Diagnosis not present

## 2017-12-05 DIAGNOSIS — M1990 Unspecified osteoarthritis, unspecified site: Secondary | ICD-10-CM | POA: Diagnosis not present

## 2017-12-05 DIAGNOSIS — Z9049 Acquired absence of other specified parts of digestive tract: Secondary | ICD-10-CM | POA: Diagnosis not present

## 2017-12-05 DIAGNOSIS — K5900 Constipation, unspecified: Secondary | ICD-10-CM | POA: Diagnosis not present

## 2017-12-05 DIAGNOSIS — N200 Calculus of kidney: Secondary | ICD-10-CM | POA: Diagnosis not present

## 2017-12-05 DIAGNOSIS — E785 Hyperlipidemia, unspecified: Secondary | ICD-10-CM | POA: Diagnosis not present

## 2017-12-05 DIAGNOSIS — Z888 Allergy status to other drugs, medicaments and biological substances status: Secondary | ICD-10-CM | POA: Diagnosis not present

## 2017-12-05 DIAGNOSIS — Z880 Allergy status to penicillin: Secondary | ICD-10-CM | POA: Diagnosis not present

## 2017-12-05 DIAGNOSIS — K5909 Other constipation: Secondary | ICD-10-CM | POA: Diagnosis not present

## 2017-12-05 DIAGNOSIS — Z79899 Other long term (current) drug therapy: Secondary | ICD-10-CM | POA: Diagnosis not present

## 2017-12-05 DIAGNOSIS — R1084 Generalized abdominal pain: Secondary | ICD-10-CM | POA: Diagnosis not present

## 2017-12-05 DIAGNOSIS — R69 Illness, unspecified: Secondary | ICD-10-CM | POA: Diagnosis not present

## 2017-12-06 DIAGNOSIS — R109 Unspecified abdominal pain: Secondary | ICD-10-CM | POA: Diagnosis not present

## 2017-12-06 DIAGNOSIS — N200 Calculus of kidney: Secondary | ICD-10-CM | POA: Diagnosis not present

## 2017-12-07 DIAGNOSIS — G935 Compression of brain: Secondary | ICD-10-CM | POA: Diagnosis not present

## 2017-12-07 DIAGNOSIS — M5440 Lumbago with sciatica, unspecified side: Secondary | ICD-10-CM | POA: Diagnosis not present

## 2017-12-07 DIAGNOSIS — Z6833 Body mass index (BMI) 33.0-33.9, adult: Secondary | ICD-10-CM | POA: Diagnosis not present

## 2017-12-09 DIAGNOSIS — G960 Cerebrospinal fluid leak: Secondary | ICD-10-CM | POA: Diagnosis not present

## 2017-12-09 DIAGNOSIS — R030 Elevated blood-pressure reading, without diagnosis of hypertension: Secondary | ICD-10-CM | POA: Diagnosis not present

## 2017-12-09 DIAGNOSIS — Z6833 Body mass index (BMI) 33.0-33.9, adult: Secondary | ICD-10-CM | POA: Diagnosis not present

## 2017-12-09 DIAGNOSIS — R51 Headache: Secondary | ICD-10-CM | POA: Diagnosis not present

## 2017-12-21 ENCOUNTER — Other Ambulatory Visit: Payer: Self-pay

## 2017-12-21 ENCOUNTER — Other Ambulatory Visit: Payer: Self-pay | Admitting: Family Medicine

## 2017-12-21 MED ORDER — DULOXETINE HCL 60 MG PO CPEP: 120.0000 mg | ORAL_CAPSULE | Freq: Every day | ORAL | 0 refills | Status: DC

## 2017-12-30 ENCOUNTER — Other Ambulatory Visit: Payer: Self-pay

## 2017-12-30 MED ORDER — PRAMIPEXOLE DIHYDROCHLORIDE 0.5 MG PO TABS: 0.5000 mg | ORAL_TABLET | Freq: Every day | ORAL | 0 refills | Status: DC

## 2018-01-13 ENCOUNTER — Ambulatory Visit (INDEPENDENT_AMBULATORY_CARE_PROVIDER_SITE_OTHER): Payer: Medicare HMO | Admitting: Family Medicine

## 2018-01-13 ENCOUNTER — Encounter: Payer: Self-pay | Admitting: Family Medicine

## 2018-01-13 VITALS — BP 125/84 | HR 101 | Wt 195.0 lb

## 2018-01-13 DIAGNOSIS — F5101 Primary insomnia: Secondary | ICD-10-CM

## 2018-01-13 DIAGNOSIS — F3341 Major depressive disorder, recurrent, in partial remission: Secondary | ICD-10-CM

## 2018-01-13 DIAGNOSIS — Z23 Encounter for immunization: Secondary | ICD-10-CM

## 2018-01-13 DIAGNOSIS — R69 Illness, unspecified: Secondary | ICD-10-CM | POA: Diagnosis not present

## 2018-01-13 DIAGNOSIS — R454 Irritability and anger: Secondary | ICD-10-CM

## 2018-01-13 MED ORDER — BUSPIRONE HCL 10 MG PO TABS: 10.0000 mg | ORAL_TABLET | Freq: Three times a day (TID) | ORAL | 3 refills | Status: DC

## 2018-01-13 NOTE — Progress Notes (Signed)
Katrina Fernandez is a 52 y.o. female who presents to [facility] today for follow-up mood.  Katrina Fernandez has a history of some depression and anxiety symptoms.  These typically have been quite well controlled with Cymbalta.  She typically has been taking 120 mg of Cymbalta daily.  She notes that her depression symptoms have been pretty stable but her anxiety/irritability has been worsening slowly over the last 4 months.  She notes that she has less patience with others and is more short tempered.  Her daughter has a chronic medical condition is slowly causing blindness.  She finds this distressing.  Her daughter is currently receiving treatment and is in good spirits but this bothers Katrina Fernandez.  She also notes that she has been woken up at night by her dogs from sleep.  She is not sure if that has anything to do with her symptoms.   ROS as above:  Exam:  BP 125/84   Pulse (!) 101   Wt 195 lb (88.5 kg)   BMI 34.54 kg/m  Wt Readings from Last 5 Encounters:  01/13/18 195 lb (88.5 kg)  10/25/17 192 lb (87.1 kg)  08/20/17 202 lb (91.6 kg)  07/20/17 200 lb (90.7 kg)  07/06/17 199 lb (90.3 kg)    Gen: Well NAD HEENT: EOMI,  MMM Lungs: Normal work of breathing. CTABL Heart: RRR no MRG Abd: NABS, Soft. Nondistended, Nontender Exts: Brisk capillary refill, warm and well perfused.  Psych alert and oriented normal speech thought process and affect.  Depression screen [facility] 2/9 01/13/2018 07/20/2017 09/03/2016 03/25/2016 02/20/2016  Decreased Interest 1 0 0 0 0  Down, Depressed, Hopeless 2 0 1 1 2   PHQ - 2 Score 3 0 1 1 2   Altered sleeping 2 - - 3 3  Tired, decreased energy 2 - - 2 1  Change in appetite 0 - - 0 1  Feeling bad or failure about yourself  2 - - 0 0  Trouble concentrating 0 - - 0 0  Moving slowly or fidgety/restless 0 - - 0 0  Suicidal thoughts 0 - - 0 0  PHQ-9 Score  9 - - 6 7  Difficult doing work/chores Somewhat difficult - - Somewhat difficult Somewhat difficult   GAD 7 : Generalized Anxiety Score 01/13/2018 03/25/2016 02/20/2016  Nervous, Anxious, on Edge 2 1 3   Control/stop worrying 1 0 3  Worry too much - different things 2 1 3   Trouble relaxing 0 1 2  Restless 0 0 2  Easily annoyed or irritable 3 1 1   Afraid - awful might happen 0 0 0  Total GAD 7 Score 8 4 14   Anxiety Difficulty Very difficult Somewhat difficult Somewhat difficult      Assessment and Plan: 52 y.o. female with  Irritability: Main issue today.  Not fully controlled with Cymbalta.  Discussed options.  For now trial of BuSpar.  If not sufficient after titration of BuSpar consider atypical antipsychotic like Abilify or potentially Seroquel at bedtime.  Recheck in about a month.  Chronic problem with worsening.  Depression: Still active but stable.  Continue Cymbalta.  Insomnia: Reasonably well controlled.  Continue current regimen.  Flu vaccine given Orders Placed This Encounter  Procedures  . Flu Vaccine QUAD 36+ mos IM    [place]   Meds ordered this encounter  Medications  . busPIRone (BUSPAR) 10 MG tablet    Sig: Take 1 tablet (10 mg total) by  mouth 3 (three) times daily.    Dispense:  90 tablet    Refill:  3     Historical information moved to improve visibility of documentation.  Past Medical History:  Diagnosis Date  . Anemia   . Anxiety   . Arthritis    "back and knees" (07/23/2016)  . Chiari malformation type I ([place]) dx'd 2014  . Chronic back pain    buldging, DDD; "neck and lower back" (07/23/2016)  . Chronic constipation    takes Miralax daily  . Chronic pain syndrome    takes Methadone and Keppra daily  . Depression    takes Effexor daily  . Endometriosis    1995  . Fibromyalgia   . GERD (gastroesophageal reflux disease)    not taking anything at the present time  . History of DVT of lower extremity 07/2012   LEFT LEG --  3/ 2014.Was on a  blood transfusion for 6 months  . History of kidney stones   . History of MRSA infection 10 yrs ago  . Hyperlipidemia    takes Simvastatin daily  . Migraine    "daily recently" (07/23/2016)  . Muscle spasm    takes Zanaflex nightly  . Peripheral edema    takes Lasix daily as needed  . Peripheral neuropathy   . Pneumonia    hx of-7-8 yrs ago  . PONV (postoperative nausea and vomiting)   . Seasonal allergies    takes Claritin daily  . Spinal headache    "since 2002; still get them; try to manage it w/lying flat"  . Weakness    and numbness in right hand   Past Surgical History:  Procedure Laterality Date  . ANTERIOR CERVICAL DECOMP/DISCECTOMY FUSION  07-25-2009   C5 -- C6  . APPENDECTOMY  age 53  . APPLICATION OF CRANIAL NAVIGATION N/A 06/23/2017   Procedure: APPLICATION OF CRANIAL NAVIGATION;  Surgeon: Kary Kos, MD;  Location: [facility];  Service: Neurosurgery;  Laterality: N/A;  . BACK SURGERY    . BREAST REDUCTION SURGERY Bilateral 06/07/2013   Procedure: BILATERAL MAMMARY REDUCTION  (BREAST);  Surgeon: Theodoro Kos, DO;  Location: [facility];  Service: Plastics;  Laterality: Bilateral;  . BREAST REDUCTION SURGERY Bilateral 06/07/2013   Procedure:  LIPOSUCTION;  Surgeon: Theodoro Kos, DO;  Location: [facility];  Service: Plastics;  Laterality: Bilateral;  . CARPAL TUNNEL RELEASE Bilateral 2001  . COLONOSCOPY  X 2   "polyps were benign"  . DILATION AND CURETTAGE OF UTERUS    . ESOPHAGOGASTRODUODENOSCOPY    . gastric banding undone  11/2014  . LAPAROSCOPIC CHOLECYSTECTOMY  ~ 2000  . LAPAROSCOPIC GASTRIC BANDING  2011  . LAPAROSCOPIC REVISION VENTRICULAR-PERITONEAL (V-P) SHUNT N/A 06/23/2017   Procedure: Shunt Placment stereotactic ventricular catheter placement and general surgery abdominal exposure  (Laparoscopic vs. Open) Brain Lab;  Surgeon: Clovis Riley, MD;  Location: [facility];  Service: General;  Laterality: N/A;  . LAPAROSCOPY N/A 06/23/2017    Procedure: LAPAROSCOPY DIAGNOSTIC;  Surgeon: Clovis Riley, MD;  Location: [facility];  Service: General;  Laterality: N/A;  . LUMBAR LAMINECTOMY/DECOMPRESSION MICRODISCECTOMY N/A 07/23/2016   Procedure: Decompressive Lumbar Laminectomy Lumbar two-three  for Repair of  Cerberal Spinal Fluid  Leak;  Surgeon: Kary Kos, MD;  Location: [facility];  Service: Neurosurgery;  Laterality: N/A;  . PLACEMENT LUMBOPERITONEAL SHUNT FOR CEREBROSPINAL FLUID DIVERSION AWAY FROM C8 PERINEURAL CYST  09-05-2003  &  12-24-2003   08-31-2003  REMOVAL SHUNT  . POSTERIOR  CERVICAL LAMINECTOMY  03-01-2001   C7-8 and Exploration C8 perineural cyst  . POSTERIOR LUMBAR LAMINECTOMY L4-5/  DISKECTOMY L5-S1  05-20-2005  . RE-DO DECOMPRESSION LAMINECTOMY AND FUSION L4-5  &  L5-S1  12-20-2006   02-04-2007--  RE-EXPLORATION L4 to S1, I & D LUMBAR WOUND HEMATOMA  . REDUCTION MAMMAPLASTY    . SHUNT REMOVAL N/A 07/10/2016   Procedure: SHUNT REMOVAL;  Surgeon: Kary Kos, MD;  Location: [facility];  Service: Neurosurgery;  Laterality: N/A;  SHUNT REMOVAL  . SPINAL CORD STIMULATOR INSERTION N/A 04/26/2015   Procedure: LUMBAR SPINAL CORD STIMULATOR INSERTION;  Surgeon: Clydell Hakim, MD;  Location: [facility];  Service: Neurosurgery;  Laterality: N/A;  LUMBAR SPINAL CORD STIMULATOR INSERTION  . TENNIS ELBOW RELEASE/NIRSCHEL PROCEDURE Left 12/13/2014   Procedure: LEFT ELBOW LATERAL EPICONDYLAR DEBRIDEMENT AND OR REPAIR -RECONSTRUCTION ;  Surgeon: Iran Planas, MD;  Location: [facility];  Service: Orthopedics;  Laterality: Left;  . TOTAL ABDOMINAL HYSTERECTOMY  1994   w/  Bilateral Salpinoophorectomy  . VENTRICULOPERITONEAL SHUNT N/A 06/23/2017   Procedure: VENTRICULAR-PERITONEAL SHUNT INSERTION WITH ABDOMINAL LAPARASCOPY AND BRAINLAB NAVIGATION;  Surgeon: Kary Kos, MD;  Location: [facility];  Service: Neurosurgery;  Laterality: N/A;   Social History   Tobacco Use  . Smoking status: Never Smoker  . Smokeless tobacco: Never Used    Substance Use Topics  . Alcohol use: No   family history includes Colon cancer in her father; Depression in her unknown relative; Ovarian cancer in her maternal grandmother; Pancreatic cancer in her maternal grandfather.  Medications: Current Outpatient Medications  Medication Sig Dispense Refill  . DULoxetine (CYMBALTA) 60 MG capsule Take 2 capsules (120 mg total) by mouth daily. 180 capsule 0  . furosemide (LASIX) 40 MG tablet TAKE 1 TABLET BY MOUTH TWICE A DAY 60 tablet 0  . levETIRAcetam (KEPPRA) 500 MG tablet Take 500 mg by mouth 2 (two) times daily.     . methadone (DOLOPHINE) 5 MG tablet Take 2.5 mg by mouth every morning.   0  . montelukast (SINGULAIR) 10 MG tablet TAKE 1 TABLET (10 MG TOTAL) BY MOUTH AT BEDTIME. 30 tablet 5  . pramipexole (MIRAPEX) 0.5 MG tablet Take 1 tablet (0.5 mg total) by mouth at bedtime. 90 tablet 0  . simvastatin (ZOCOR) 20 MG tablet TAKE 1 TABLET BY MOUTH DAILY. 90 tablet 1  . tiZANidine (ZANAFLEX) 4 MG capsule Take 4-8 mg by mouth 2 (two) times daily. ONE TAB AM - PRN AND 2 TABS AT BEDTIME    . traMADol (ULTRAM) 50 MG tablet Take 50-100 mg by mouth at bedtime.     . busPIRone (BUSPAR) 10 MG tablet Take 1 tablet (10 mg total) by mouth 3 (three) times daily. 90 tablet 3   No current facility-administered medications for this visit.    Allergies  Allergen Reactions  . Penicillins Anaphylaxis    Has patient had a PCN reaction causing immediate rash, facial/tongue/throat swelling, SOB or lightheadedness with hypotension: Yes Has patient had a PCN reaction causing severe rash involving mucus membranes or skin necrosis: No Has patient had a PCN reaction that required hospitalization No Has patient had a PCN reaction occurring within the last 10 years: No If all of the above answers are "NO", then may proceed with Cephalosporin use.   . Lactose Swelling and Rash    SWELLING REACTION UNSPECIFIED   . Chlorhexidine Other (See Comments)    Dermatitis -  Allergic  . Tape Rash    Plastic  tape, silicone tape     Discussed warning signs or symptoms. Please see discharge instructions. Patient expresses understanding.

## 2018-01-13 NOTE — Patient Instructions (Addendum)
Thank you for coming in today. Continue cymbalta Start Buspar.  Ok to cut in half or double based on effect.  If you do that let me know.   If Buspar dose not work well next thought is abilify or seroquel to add to cymbalta.   Therapy is a great idea.   Recheck in about 1 month.

## 2018-01-14 DIAGNOSIS — R454 Irritability and anger: Secondary | ICD-10-CM | POA: Insufficient documentation

## 2018-02-01 ENCOUNTER — Encounter: Payer: Self-pay | Admitting: Family Medicine

## 2018-02-01 DIAGNOSIS — F3341 Major depressive disorder, recurrent, in partial remission: Secondary | ICD-10-CM

## 2018-02-04 MED ORDER — ARIPIPRAZOLE 5 MG PO TABS: 5.0000 mg | ORAL_TABLET | Freq: Every day | ORAL | 1 refills | Status: DC

## 2018-02-11 DIAGNOSIS — M79609 Pain in unspecified limb: Secondary | ICD-10-CM | POA: Diagnosis not present

## 2018-02-11 DIAGNOSIS — G894 Chronic pain syndrome: Secondary | ICD-10-CM | POA: Diagnosis not present

## 2018-02-11 DIAGNOSIS — M7918 Myalgia, other site: Secondary | ICD-10-CM | POA: Diagnosis not present

## 2018-02-11 DIAGNOSIS — M7712 Lateral epicondylitis, left elbow: Secondary | ICD-10-CM | POA: Diagnosis not present

## 2018-02-11 DIAGNOSIS — Z79899 Other long term (current) drug therapy: Secondary | ICD-10-CM | POA: Diagnosis not present

## 2018-02-13 ENCOUNTER — Other Ambulatory Visit: Payer: Self-pay | Admitting: Family Medicine

## 2018-02-14 ENCOUNTER — Ambulatory Visit: Payer: Medicare HMO | Admitting: Family Medicine

## 2018-02-14 MED ORDER — ARIPIPRAZOLE 5 MG PO TABS: ORAL_TABLET | ORAL | 1 refills | Status: DC

## 2018-02-14 NOTE — Addendum Note (Signed)
Addended by: Narda Rutherford on: 02/14/2018 07:30 AM   Modules accepted: Orders

## 2018-02-23 ENCOUNTER — Encounter: Payer: Self-pay | Admitting: Family Medicine

## 2018-02-23 ENCOUNTER — Ambulatory Visit (INDEPENDENT_AMBULATORY_CARE_PROVIDER_SITE_OTHER): Payer: Medicare HMO | Admitting: Family Medicine

## 2018-02-23 VITALS — BP 124/85 | HR 121 | Temp 99.7°F | Ht 63.0 in | Wt 197.0 lb

## 2018-02-23 DIAGNOSIS — R000 Tachycardia, unspecified: Secondary | ICD-10-CM

## 2018-02-23 DIAGNOSIS — J029 Acute pharyngitis, unspecified: Secondary | ICD-10-CM | POA: Diagnosis not present

## 2018-02-23 LAB — POCT RAPID STREP A (OFFICE): Rapid Strep A Screen: NEGATIVE

## 2018-02-23 MED ORDER — CLINDAMYCIN HCL 300 MG PO CAPS: 300.0000 mg | ORAL_CAPSULE | Freq: Three times a day (TID) | ORAL | 0 refills | Status: DC

## 2018-02-23 NOTE — Patient Instructions (Signed)
Thank you for coming in today. Take clindamycin three times daily for infection.  Ok to continue over the counter medicine as needed.   Call or go to the emergency room if you get worse, have trouble breathing, have chest pains, or palpitations.    Tonsillitis Tonsillitis is an infection of the throat that causes the tonsils to become red, tender, and swollen. Tonsils are collections of lymphoid tissue at the back of the throat. Each tonsil has crevices (crypts). Tonsils help fight nose and throat infections and keep infection from spreading to other parts of the body for the first 18 months of life. What are the causes? Sudden (acute) tonsillitis is usually caused by infection with streptococcal bacteria. Long-lasting (chronic) tonsillitis occurs when the crypts of the tonsils become filled with pieces of food and bacteria, which makes it easy for the tonsils to become repeatedly infected. What are the signs or symptoms? Symptoms of tonsillitis include:  A sore throat, with possible difficulty swallowing.  White patches on the tonsils.  Fever.  Tiredness.  New episodes of snoring during sleep, when you did not snore before.  Small, foul-smelling, yellowish-white pieces of material (tonsilloliths) that you occasionally cough up or spit out. The tonsilloliths can also cause you to have bad breath.  How is this diagnosed? Tonsillitis can be diagnosed through a physical exam. Diagnosis can be confirmed with the results of lab tests, including a throat culture. How is this treated? The goals of tonsillitis treatment include the reduction of the severity and duration of symptoms and prevention of associated conditions. Symptoms of tonsillitis can be improved with the use of steroids to reduce the swelling. Tonsillitis caused by bacteria can be treated with antibiotic medicines. Usually, treatment with antibiotic medicines is started before the cause of the tonsillitis is known. However, if it  is determined that the cause is not bacterial, antibiotic medicines will not treat the tonsillitis. If attacks of tonsillitis are severe and frequent, your health care provider may recommend surgery to remove the tonsils (tonsillectomy). Follow these instructions at home:  Rest as much as possible and get plenty of sleep.  Drink plenty of fluids. While the throat is very sore, eat soft foods or liquids, such as sherbet, soups, or instant breakfast drinks.  Eat frozen ice pops.  Gargle with a warm or cold liquid to help soothe the throat. Mix 1/4 teaspoon of salt and 1/4 teaspoon of baking soda in 8 oz of water. Contact a health care provider if:  Large, tender lumps develop in your neck.  A rash develops.  A green, yellow-brown, or bloody substance is coughed up.  You are unable to swallow liquids or food for 24 hours.  You notice that only one of the tonsils is swollen. Get help right away if:  You develop any new symptoms such as vomiting, severe headache, stiff neck, chest pain, or trouble breathing or swallowing.  You have severe throat pain along with drooling or voice changes.  You have severe pain, unrelieved with recommended medications.  You are unable to fully open the mouth.  You develop redness, swelling, or severe pain anywhere in the neck.  You have a fever. This information is not intended to replace advice given to you by your health care provider. Make sure you discuss any questions you have with your health care provider. Document Released: 02/11/2005 Document Revised: 10/10/2015 Document Reviewed: 10/21/2012 Elsevier Interactive Patient Education  2017 [employer].

## 2018-02-23 NOTE — Progress Notes (Signed)
Katrina Fernandez is a 52 y.o. female who presents to [place]: [place] today for sore throat.  Patient notes a 3-day history of sore throat.  She denies significant cough or shortness of breath.  She is been trying over-the-counter medications and notes that they are not very helpful.  She notes exudate at the posterior pharynx as well as swollen lymph nodes.  She has a history of strep throat notes her symptoms are consistent with strep throat.  She notes when he was sick with a similar illness.   ROS as above:  Exam:  BP 124/85   Pulse (!) 121   Temp 99.7 F (37.6 C) (Oral)   Ht 5\' 3"  (1.6 m)   Wt 197 lb (89.4 kg)   BMI 34.90 kg/m  Wt Readings from Last 5 Encounters:  02/23/18 197 lb (89.4 kg)  01/13/18 195 lb (88.5 kg)  10/25/17 192 lb (87.1 kg)  08/20/17 202 lb (91.6 kg)  07/20/17 200 lb (90.7 kg)    Gen: Well NAD HEENT: EOMI,  MMM posterior pharynx with mild tonsillar hypertrophy with erythema and exudates bilaterally.  Normal tympanic membranes bilaterally.  Cervical lymphadenopathy present bilaterally. Lungs: Normal work of breathing. CTABL Heart: Tachycardia regular rhythm no MRG Abd: NABS, Soft. Nondistended, Nontender Exts: Brisk capillary refill, warm and well perfused.   Lab and Radiology Results Results for orders placed or performed in visit on 02/23/18 (from the past 72 hour(s))  POCT rapid strep A     Status: None   Collection Time: 02/23/18 11:16 AM  Result Value Ref Range   Rapid Strep A Screen Negative Negative   No results found.    Assessment and Plan: 52 y.o. female with   tonsillitis likely bacterial.  Rapid strep test negative.  Reasonable to go ahead and treat with antibiotics.  Will use clindamycin given penicillin allergy.  Recheck if not improving.  Continue over-the-counter medications.  Return sooner if needed.  Tachycardia  likely related to pharyngitis and illness.  Watchful waiting recheck if not improving.   Orders Placed This Encounter  Procedures  . POCT rapid strep A   Meds ordered this encounter  Medications  . clindamycin (CLEOCIN) 300 MG capsule    Sig: Take 1 capsule (300 mg total) by mouth 3 (three) times daily.    Dispense:  21 capsule    Refill:  0     Historical information moved to improve visibility of documentation.  Past Medical History:  Diagnosis Date  . Anemia   . Anxiety   . Arthritis    "back and knees" (07/23/2016)  . Chiari malformation type I ([place]) dx'd 2014  . Chronic back pain    buldging, DDD; "neck and lower back" (07/23/2016)  . Chronic constipation    takes Miralax daily  . Chronic pain syndrome    takes Methadone and Keppra daily  . Depression    takes Effexor daily  . Endometriosis    1995  . Fibromyalgia   . GERD (gastroesophageal reflux disease)    not taking anything at the present time  . History of DVT of lower extremity 07/2012   LEFT LEG --  3/ 2014.Was on a blood transfusion for 6 months  . History of kidney stones   . History of MRSA infection 10 yrs ago  . Hyperlipidemia    takes Simvastatin daily  . Migraine    "daily recently" (07/23/2016)  . Muscle spasm  takes Zanaflex nightly  . Peripheral edema    takes Lasix daily as needed  . Peripheral neuropathy   . Pneumonia    hx of-7-8 yrs ago  . PONV (postoperative nausea and vomiting)   . Seasonal allergies    takes Claritin daily  . Spinal headache    "since 2002; still get them; try to manage it w/lying flat"  . Weakness    and numbness in right hand   Past Surgical History:  Procedure Laterality Date  . ANTERIOR CERVICAL DECOMP/DISCECTOMY FUSION  07-25-2009   C5 -- C6  . APPENDECTOMY  age 83  . APPLICATION OF CRANIAL NAVIGATION N/A 06/23/2017   Procedure: APPLICATION OF CRANIAL NAVIGATION;  Surgeon: Kary Kos, MD;  Location: [facility];  Service: Neurosurgery;  Laterality: N/A;  . BACK  SURGERY    . BREAST REDUCTION SURGERY Bilateral 06/07/2013   Procedure: BILATERAL MAMMARY REDUCTION  (BREAST);  Surgeon: Theodoro Kos, DO;  Location: [facility];  Service: Plastics;  Laterality: Bilateral;  . BREAST REDUCTION SURGERY Bilateral 06/07/2013   Procedure:  LIPOSUCTION;  Surgeon: Theodoro Kos, DO;  Location: [facility];  Service: Plastics;  Laterality: Bilateral;  . CARPAL TUNNEL RELEASE Bilateral 2001  . COLONOSCOPY  X 2   "polyps were benign"  . DILATION AND CURETTAGE OF UTERUS    . ESOPHAGOGASTRODUODENOSCOPY    . gastric banding undone  11/2014  . LAPAROSCOPIC CHOLECYSTECTOMY  ~ 2000  . LAPAROSCOPIC GASTRIC BANDING  2011  . LAPAROSCOPIC REVISION VENTRICULAR-PERITONEAL (V-P) SHUNT N/A 06/23/2017   Procedure: Shunt Placment stereotactic ventricular catheter placement and general surgery abdominal exposure  (Laparoscopic vs. Open) Brain Lab;  Surgeon: Clovis Riley, MD;  Location: [facility];  Service: General;  Laterality: N/A;  . LAPAROSCOPY N/A 06/23/2017   Procedure: LAPAROSCOPY DIAGNOSTIC;  Surgeon: Clovis Riley, MD;  Location: [facility];  Service: General;  Laterality: N/A;  . LUMBAR LAMINECTOMY/DECOMPRESSION MICRODISCECTOMY N/A 07/23/2016   Procedure: Decompressive Lumbar Laminectomy Lumbar two-three  for Repair of  Cerberal Spinal Fluid  Leak;  Surgeon: Kary Kos, MD;  Location: [facility];  Service: Neurosurgery;  Laterality: N/A;  . PLACEMENT LUMBOPERITONEAL SHUNT FOR CEREBROSPINAL FLUID DIVERSION AWAY FROM C8 PERINEURAL CYST  09-05-2003  &  12-24-2003   08-31-2003  REMOVAL SHUNT  . POSTERIOR CERVICAL LAMINECTOMY  03-01-2001   C7-8 and Exploration C8 perineural cyst  . POSTERIOR LUMBAR LAMINECTOMY L4-5/  DISKECTOMY L5-S1  05-20-2005  . RE-DO DECOMPRESSION LAMINECTOMY AND FUSION L4-5  &  L5-S1  12-20-2006   02-04-2007--  RE-EXPLORATION L4 to S1, I & D LUMBAR WOUND HEMATOMA  . REDUCTION MAMMAPLASTY    . SHUNT REMOVAL N/A 07/10/2016   Procedure: SHUNT  REMOVAL;  Surgeon: Kary Kos, MD;  Location: [facility];  Service: Neurosurgery;  Laterality: N/A;  SHUNT REMOVAL  . SPINAL CORD STIMULATOR INSERTION N/A 04/26/2015   Procedure: LUMBAR SPINAL CORD STIMULATOR INSERTION;  Surgeon: Clydell Hakim, MD;  Location: [facility];  Service: Neurosurgery;  Laterality: N/A;  LUMBAR SPINAL CORD STIMULATOR INSERTION  . TENNIS ELBOW RELEASE/NIRSCHEL PROCEDURE Left 12/13/2014   Procedure: LEFT ELBOW LATERAL EPICONDYLAR DEBRIDEMENT AND OR REPAIR -RECONSTRUCTION ;  Surgeon: Iran Planas, MD;  Location: [facility];  Service: Orthopedics;  Laterality: Left;  . TOTAL ABDOMINAL HYSTERECTOMY  1994   w/  Bilateral Salpinoophorectomy  . VENTRICULOPERITONEAL SHUNT N/A 06/23/2017   Procedure: VENTRICULAR-PERITONEAL SHUNT INSERTION WITH ABDOMINAL LAPARASCOPY AND BRAINLAB NAVIGATION;  Surgeon: Kary Kos, MD;  Location: [facility];  Service: Neurosurgery;  Laterality: N/A;   Social History   Tobacco Use  . Smoking status: Never Smoker  . Smokeless tobacco: Never Used  Substance Use Topics  . Alcohol use: No   family history includes Colon cancer in her father; Depression in her unknown relative; Ovarian cancer in her maternal grandmother; Pancreatic cancer in her maternal grandfather.  Medications: Current Outpatient Medications  Medication Sig Dispense Refill  . ARIPiprazole (ABILIFY) 5 MG tablet TAKE 1 TABLET BY MOUTH EVERYDAY AT BEDTIME 90 tablet 1  . DULoxetine (CYMBALTA) 60 MG capsule Take 2 capsules (120 mg total) by mouth daily. 180 capsule 0  . furosemide (LASIX) 40 MG tablet TAKE 1 TABLET BY MOUTH TWICE A DAY 60 tablet 0  . levETIRAcetam (KEPPRA) 500 MG tablet Take 500 mg by mouth 2 (two) times daily.     . methadone (DOLOPHINE) 5 MG tablet Take 2.5 mg by mouth every morning.   0  . montelukast (SINGULAIR) 10 MG tablet TAKE 1 TABLET (10 MG TOTAL) BY MOUTH AT BEDTIME. 30 tablet 5  . pramipexole (MIRAPEX) 0.5 MG tablet Take 1 tablet (0.5 mg total) by mouth  at bedtime. 90 tablet 0  . simvastatin (ZOCOR) 20 MG tablet TAKE 1 TABLET BY MOUTH DAILY. 90 tablet 1  . tiZANidine (ZANAFLEX) 4 MG capsule Take 4-8 mg by mouth 2 (two) times daily. ONE TAB AM - PRN AND 2 TABS AT BEDTIME    . traMADol (ULTRAM) 50 MG tablet Take 50-100 mg by mouth at bedtime.     . clindamycin (CLEOCIN) 300 MG capsule Take 1 capsule (300 mg total) by mouth 3 (three) times daily. 21 capsule 0   No current facility-administered medications for this visit.    Allergies  Allergen Reactions  . Penicillins Anaphylaxis    Has patient had a PCN reaction causing immediate rash, facial/tongue/throat swelling, SOB or lightheadedness with hypotension: Yes Has patient had a PCN reaction causing severe rash involving mucus membranes or skin necrosis: No Has patient had a PCN reaction that required hospitalization No Has patient had a PCN reaction occurring within the last 10 years: No If all of the above answers are "NO", then may proceed with Cephalosporin use.   . Lactose Swelling and Rash    SWELLING REACTION UNSPECIFIED   . Chlorhexidine Other (See Comments)    Dermatitis - Allergic  . Tape Rash    Plastic tape, silicone tape     Discussed warning signs or symptoms. Please see discharge instructions. Patient expresses understanding.

## 2018-02-28 ENCOUNTER — Telehealth: Payer: Self-pay

## 2018-02-28 MED ORDER — FLUCONAZOLE 150 MG PO TABS: 150.0000 mg | ORAL_TABLET | Freq: Once | ORAL | 1 refills | Status: AC

## 2018-02-28 NOTE — Telephone Encounter (Signed)
Left VM with status update.  

## 2018-02-28 NOTE — Telephone Encounter (Signed)
Nystatin swish and swallow sent to pharmacy

## 2018-02-28 NOTE — Telephone Encounter (Signed)
Patient called stated that she was taking Clindamycin and she now has a thrush and yeast, she wants to know if she can get a Rx for Diflucan sent to her pharmacy. Please advise. Adriel Desrosier,CMA

## 2018-03-01 ENCOUNTER — Encounter: Payer: Self-pay | Admitting: Family Medicine

## 2018-03-01 ENCOUNTER — Ambulatory Visit (INDEPENDENT_AMBULATORY_CARE_PROVIDER_SITE_OTHER): Payer: Medicare HMO | Admitting: Family Medicine

## 2018-03-01 VITALS — BP 129/82 | HR 106 | Temp 98.0°F | Wt 191.0 lb

## 2018-03-01 DIAGNOSIS — J029 Acute pharyngitis, unspecified: Secondary | ICD-10-CM | POA: Diagnosis not present

## 2018-03-01 MED ORDER — PREDNISONE 10 MG PO TABS: 30.0000 mg | ORAL_TABLET | Freq: Every day | ORAL | 0 refills | Status: DC

## 2018-03-01 MED ORDER — CLINDAMYCIN HCL 300 MG PO CAPS: 300.0000 mg | ORAL_CAPSULE | Freq: Three times a day (TID) | ORAL | 0 refills | Status: DC

## 2018-03-01 NOTE — Patient Instructions (Signed)
Thank you for coming in today. Repeat clindamycin. Backup prednisone if not better.  Recheck as needed.

## 2018-03-01 NOTE — Progress Notes (Signed)
Katrina Fernandez is a 52 y.o. female who presents to [facility] [place]: [place] today for continued sore throat.  Katrina Fernandez was seen on October 9 for sore throat thought to have tonsillitis and treated with clindamycin.  She notes the clindamycin initially helped but once the course completed the symptoms started coming back.  She is a bit better than she was previously but still quite bothered by her sore throat.  She is been using Tylenol which does help.  She denies fevers or trouble breathing nausea vomiting or diarrhea.   ROS as above:  Exam:  BP 129/82   Pulse (!) 106   Temp 98 F (36.7 C) (Oral)   Wt 191 lb (86.6 kg)   BMI 33.83 kg/m  Wt Readings from Last 5 Encounters:  03/01/18 191 lb (86.6 kg)  02/23/18 197 lb (89.4 kg)  01/13/18 195 lb (88.5 kg)  10/25/17 192 lb (87.1 kg)  08/20/17 202 lb (91.6 kg)    Gen: Well NAD HEENT: EOMI,  MMM posterior pharynx erythematous with exudate.  Mild cervical lymphadenopathy present.  Normal tympanic membranes present. Lungs: Normal work of breathing. CTABL Heart: Minimal tachycardia with regular rhythm no MRG Abd: NABS, Soft. Nondistended, Nontender Exts: Brisk capillary refill, warm and well perfused.   Lab and Radiology Results No results found for this or any previous visit (from the past 72 hour(s)). No results found.    Assessment and Plan: 52 y.o. female with pharyngitis likely still potentially bacterial given symptom return following clindamycin course.  Plan to repeat clindamycin course for another 7 days.  Back-up prednisone printed and will be used if not improved or improving following clindamycin administration.  Recheck as needed.   No orders of the defined types were placed in this encounter.  Meds ordered this encounter  Medications  . clindamycin (CLEOCIN) 300 MG capsule    Sig: Take 1 capsule (300 mg  total) by mouth 3 (three) times daily.    Dispense:  21 capsule    Refill:  0  . predniSONE (DELTASONE) 10 MG tablet    Sig: Take 3 tablets (30 mg total) by mouth daily with breakfast.    Dispense:  15 tablet    Refill:  0     Historical information moved to improve visibility of documentation.  Past Medical History:  Diagnosis Date  . Anemia   . Anxiety   . Arthritis    "back and knees" (07/23/2016)  . Chiari malformation type I ([place]) dx'd 2014  . Chronic back pain    buldging, DDD; "neck and lower back" (07/23/2016)  . Chronic constipation    takes Miralax daily  . Chronic pain syndrome    takes Methadone and Keppra daily  . Depression    takes Effexor daily  . Endometriosis    1995  . Fibromyalgia   . GERD (gastroesophageal reflux disease)    not taking anything at the present time  . History of DVT of lower extremity 07/2012   LEFT LEG --  3/ 2014.Was on a blood transfusion for 6 months  . History of kidney stones   . History of MRSA infection 10 yrs ago  . Hyperlipidemia    takes Simvastatin daily  . Migraine    "daily recently" (07/23/2016)  . Muscle spasm    takes Zanaflex nightly  . Peripheral edema    takes Lasix daily as needed  . Peripheral neuropathy   . Pneumonia  hx of-7-8 yrs ago  . PONV (postoperative nausea and vomiting)   . Seasonal allergies    takes Claritin daily  . Spinal headache    "since 2002; still get them; try to manage it w/lying flat"  . Weakness    and numbness in right hand   Past Surgical History:  Procedure Laterality Date  . ANTERIOR CERVICAL DECOMP/DISCECTOMY FUSION  07-25-2009   C5 -- C6  . APPENDECTOMY  age 89  . APPLICATION OF CRANIAL NAVIGATION N/A 06/23/2017   Procedure: APPLICATION OF CRANIAL NAVIGATION;  Surgeon: Kary Kos, MD;  Location: [facility];  Service: Neurosurgery;  Laterality: N/A;  . BACK SURGERY    . BREAST REDUCTION SURGERY Bilateral 06/07/2013   Procedure: BILATERAL MAMMARY REDUCTION  (BREAST);  Surgeon:  Theodoro Kos, DO;  Location: [facility];  Service: Plastics;  Laterality: Bilateral;  . BREAST REDUCTION SURGERY Bilateral 06/07/2013   Procedure:  LIPOSUCTION;  Surgeon: Theodoro Kos, DO;  Location: [facility];  Service: Plastics;  Laterality: Bilateral;  . CARPAL TUNNEL RELEASE Bilateral 2001  . COLONOSCOPY  X 2   "polyps were benign"  . DILATION AND CURETTAGE OF UTERUS    . ESOPHAGOGASTRODUODENOSCOPY    . gastric banding undone  11/2014  . LAPAROSCOPIC CHOLECYSTECTOMY  ~ 2000  . LAPAROSCOPIC GASTRIC BANDING  2011  . LAPAROSCOPIC REVISION VENTRICULAR-PERITONEAL (V-P) SHUNT N/A 06/23/2017   Procedure: Shunt Placment stereotactic ventricular catheter placement and general surgery abdominal exposure  (Laparoscopic vs. Open) Brain Lab;  Surgeon: Clovis Riley, MD;  Location: [facility];  Service: General;  Laterality: N/A;  . LAPAROSCOPY N/A 06/23/2017   Procedure: LAPAROSCOPY DIAGNOSTIC;  Surgeon: Clovis Riley, MD;  Location: [facility];  Service: General;  Laterality: N/A;  . LUMBAR LAMINECTOMY/DECOMPRESSION MICRODISCECTOMY N/A 07/23/2016   Procedure: Decompressive Lumbar Laminectomy Lumbar two-three  for Repair of  Cerberal Spinal Fluid  Leak;  Surgeon: Kary Kos, MD;  Location: [facility];  Service: Neurosurgery;  Laterality: N/A;  . PLACEMENT LUMBOPERITONEAL SHUNT FOR CEREBROSPINAL FLUID DIVERSION AWAY FROM C8 PERINEURAL CYST  09-05-2003  &  12-24-2003   08-31-2003  REMOVAL SHUNT  . POSTERIOR CERVICAL LAMINECTOMY  03-01-2001   C7-8 and Exploration C8 perineural cyst  . POSTERIOR LUMBAR LAMINECTOMY L4-5/  DISKECTOMY L5-S1  05-20-2005  . RE-DO DECOMPRESSION LAMINECTOMY AND FUSION L4-5  &  L5-S1  12-20-2006   02-04-2007--  RE-EXPLORATION L4 to S1, I & D LUMBAR WOUND HEMATOMA  . REDUCTION MAMMAPLASTY    . SHUNT REMOVAL N/A 07/10/2016   Procedure: SHUNT REMOVAL;  Surgeon: Kary Kos, MD;  Location: [facility];  Service: Neurosurgery;  Laterality: N/A;  SHUNT REMOVAL  . SPINAL CORD  STIMULATOR INSERTION N/A 04/26/2015   Procedure: LUMBAR SPINAL CORD STIMULATOR INSERTION;  Surgeon: Clydell Hakim, MD;  Location: [facility];  Service: Neurosurgery;  Laterality: N/A;  LUMBAR SPINAL CORD STIMULATOR INSERTION  . TENNIS ELBOW RELEASE/NIRSCHEL PROCEDURE Left 12/13/2014   Procedure: LEFT ELBOW LATERAL EPICONDYLAR DEBRIDEMENT AND OR REPAIR -RECONSTRUCTION ;  Surgeon: Iran Planas, MD;  Location: [facility];  Service: Orthopedics;  Laterality: Left;  . TOTAL ABDOMINAL HYSTERECTOMY  1994   w/  Bilateral Salpinoophorectomy  . VENTRICULOPERITONEAL SHUNT N/A 06/23/2017   Procedure: VENTRICULAR-PERITONEAL SHUNT INSERTION WITH ABDOMINAL LAPARASCOPY AND BRAINLAB NAVIGATION;  Surgeon: Kary Kos, MD;  Location: [facility];  Service: Neurosurgery;  Laterality: N/A;   Social History   Tobacco Use  . Smoking status: Never Smoker  . Smokeless tobacco: Never Used  Substance Use Topics  . Alcohol use: No   family history includes Colon cancer in her father; Depression in her unknown relative; Ovarian cancer in her maternal grandmother; Pancreatic cancer in her maternal grandfather.  Medications: Current Outpatient Medications  Medication Sig Dispense Refill  . ARIPiprazole (ABILIFY) 5 MG tablet TAKE 1 TABLET BY MOUTH EVERYDAY AT BEDTIME 90 tablet 1  . clindamycin (CLEOCIN) 300 MG capsule Take 1 capsule (300 mg total) by mouth 3 (three) times daily. 21 capsule 0  . DULoxetine (CYMBALTA) 60 MG capsule Take 2 capsules (120 mg total) by mouth daily. 180 capsule 0  . furosemide (LASIX) 40 MG tablet TAKE 1 TABLET BY MOUTH TWICE A DAY 60 tablet 0  . levETIRAcetam (KEPPRA) 500 MG tablet Take 500 mg by mouth 2 (two) times daily.     . methadone (DOLOPHINE) 5 MG tablet Take 2.5 mg by mouth every morning.   0  . montelukast (SINGULAIR) 10 MG tablet TAKE 1 TABLET (10 MG TOTAL) BY MOUTH AT BEDTIME. 30 tablet 5  . pramipexole (MIRAPEX) 0.5 MG tablet Take 1 tablet (0.5 mg total) by mouth at  bedtime. 90 tablet 0  . simvastatin (ZOCOR) 20 MG tablet TAKE 1 TABLET BY MOUTH DAILY. 90 tablet 1  . tiZANidine (ZANAFLEX) 4 MG capsule Take 4-8 mg by mouth 2 (two) times daily. ONE TAB AM - PRN AND 2 TABS AT BEDTIME    . traMADol (ULTRAM) 50 MG tablet Take 50-100 mg by mouth at bedtime.     . predniSONE (DELTASONE) 10 MG tablet Take 3 tablets (30 mg total) by mouth daily with breakfast. 15 tablet 0   No current facility-administered medications for this visit.    Allergies  Allergen Reactions  . Penicillins Anaphylaxis    Has patient had a PCN reaction causing immediate rash, facial/tongue/throat swelling, SOB or lightheadedness with hypotension: Yes Has patient had a PCN reaction causing severe rash involving mucus membranes or skin necrosis: No Has patient had a PCN reaction that required hospitalization No Has patient had a PCN reaction occurring within the last 10 years: No If all of the above answers are "NO", then may proceed with Cephalosporin use.   . Lactose Swelling and Rash    SWELLING REACTION UNSPECIFIED   . Chlorhexidine Other (See Comments)    Dermatitis - Allergic  . Tape Rash    Plastic tape, silicone tape     Discussed warning signs or symptoms. Please see discharge instructions. Patient expresses understanding.

## 2018-03-10 DIAGNOSIS — G960 Cerebrospinal fluid leak: Secondary | ICD-10-CM | POA: Diagnosis not present

## 2018-03-10 DIAGNOSIS — R51 Headache: Secondary | ICD-10-CM | POA: Diagnosis not present

## 2018-03-10 DIAGNOSIS — M542 Cervicalgia: Secondary | ICD-10-CM | POA: Diagnosis not present

## 2018-03-17 ENCOUNTER — Ambulatory Visit (INDEPENDENT_AMBULATORY_CARE_PROVIDER_SITE_OTHER): Payer: Medicare HMO | Admitting: Family Medicine

## 2018-03-17 ENCOUNTER — Other Ambulatory Visit: Payer: Self-pay | Admitting: Family Medicine

## 2018-03-17 ENCOUNTER — Encounter: Payer: Self-pay | Admitting: Family Medicine

## 2018-03-17 VITALS — BP 126/82 | HR 103 | Wt 192.0 lb

## 2018-03-17 DIAGNOSIS — M722 Plantar fascial fibromatosis: Secondary | ICD-10-CM | POA: Diagnosis not present

## 2018-03-17 DIAGNOSIS — G5622 Lesion of ulnar nerve, left upper limb: Secondary | ICD-10-CM | POA: Diagnosis not present

## 2018-03-17 MED ORDER — PREDNISONE 5 MG (48) PO TBPK: ORAL_TABLET | ORAL | 0 refills | Status: DC

## 2018-03-17 NOTE — Progress Notes (Signed)
Katrina Fernandez is a 52 y.o. female who presents to [facility] today for left foot pain and left arm pain/tingling.  J[place]nai recently started an exercise regimen.  She did some weight lifting and walking earlier this week and developed left posterior to plantar calcaneal pain a few days ago.  She notes the pain is quite severe at times and interferes with walking normally.  She notes pain is bad with standing and when she first gets out of bed in the morning.  She is not done much treatment yet.  She denies any specific injury.  Additionally she notes some numbness and tingling extending down the ulnar aspect of her forearm.  This is been going on for a few days as well without injury.  She denies any weakness or numbness distally.  She is not sure of when its worse or better but notes that it does occur at sleep sometimes.  She has a history of bilateral carpal tunnel release.  Additionally she has a surgical history significant for left lateral epicondylitis surgery.  ROS:  As above  Exam:  BP 126/82   Pulse (!) 103   Wt 192 lb (87.1 kg)   BMI 34.01 kg/m  General: Well Developed, well nourished, and in no acute distress.  Neuro/Psych: Alert and oriented x3, extra-ocular muscles intact, able to move all 4 extremities, sensation grossly intact. Skin: Warm and dry, no rashes noted.  Respiratory: Not using accessory muscles, speaking in full sentences, trachea midline.  Cardiovascular: Pulses palpable, no extremity edema. Abdomen: Does not appear distended. MSK:  Left elbow normal-appearing normal motion nontender.  Positive Tinel's at cubital tunnel.  Reproducible paresthesias with persistent elbow flexion present. Left hand and wrist normal-appearing nontender normal motion normal strength.  Sensation is intact throughout.  Pulses are intact.  Left foot normal-appearing normal motion. Tender palpation plantar calcaneus and mildly at the  posterior calcaneus. Foot and ankle motion are intact. Strength is intact. Pulses capillary refill and sensation are intact distally. Antalgic gait present.   Neck motion intact nontender negative Spurling's test   Assessment and Plan: 52 y.o. female with  Left forearm paresthesias are cubital tunnel syndrome.  Plan for bulky night splint and relative rest.  Consider steroid Dosepak if not improving.  Left heel pain is very likely plantar fasciitis.  Differential includes early calcaneal stress injury.  Plan for cam walker boot silicone heel pads eccentric exercises and ice.  Transition out of CAM Walker boot when able.  Recheck if not improving.  Prednisone Dosepak printed and patient will use if not improving.   No orders of the defined types were placed in this encounter.  Meds ordered this encounter  Medications  . predniSONE (STERAPRED UNI-PAK 48 TAB) 5 MG (48) TBPK tablet    Sig: 12 day dosepack po    Dispense:  48 tablet    Refill:  0    Historical information moved to improve visibility of documentation.  Past Medical History:  Diagnosis Date  . Anemia   . Anxiety   . Arthritis    "back and knees" (07/23/2016)  . Chiari malformation type I ([place]) dx'd 2014  . Chronic back pain    buldging, DDD; "neck and lower back" (07/23/2016)  . Chronic constipation    takes Miralax daily  . Chronic pain syndrome    takes Methadone and Keppra daily  . Depression    takes Effexor daily  . Endometriosis    1995  .  Fibromyalgia   . GERD (gastroesophageal reflux disease)    not taking anything at the present time  . History of DVT of lower extremity 07/2012   LEFT LEG --  3/ 2014.Was on a blood transfusion for 6 months  . History of kidney stones   . History of MRSA infection 10 yrs ago  . Hyperlipidemia    takes Simvastatin daily  . Migraine    "daily recently" (07/23/2016)  . Muscle spasm    takes Zanaflex nightly  . Peripheral edema    takes Lasix daily as needed  .  Peripheral neuropathy   . Pneumonia    hx of-7-8 yrs ago  . PONV (postoperative nausea and vomiting)   . Seasonal allergies    takes Claritin daily  . Spinal headache    "since 2002; still get them; try to manage it w/lying flat"  . Weakness    and numbness in right hand   Past Surgical History:  Procedure Laterality Date  . ANTERIOR CERVICAL DECOMP/DISCECTOMY FUSION  07-25-2009   C5 -- C6  . APPENDECTOMY  age 73  . APPLICATION OF CRANIAL NAVIGATION N/A 06/23/2017   Procedure: APPLICATION OF CRANIAL NAVIGATION;  Surgeon: Kary Kos, MD;  Location: [facility];  Service: Neurosurgery;  Laterality: N/A;  . BACK SURGERY    . BREAST REDUCTION SURGERY Bilateral 06/07/2013   Procedure: BILATERAL MAMMARY REDUCTION  (BREAST);  Surgeon: Theodoro Kos, DO;  Location: [facility];  Service: Plastics;  Laterality: Bilateral;  . BREAST REDUCTION SURGERY Bilateral 06/07/2013   Procedure:  LIPOSUCTION;  Surgeon: Theodoro Kos, DO;  Location: [facility];  Service: Plastics;  Laterality: Bilateral;  . CARPAL TUNNEL RELEASE Bilateral 2001  . COLONOSCOPY  X 2   "polyps were benign"  . DILATION AND CURETTAGE OF UTERUS    . ESOPHAGOGASTRODUODENOSCOPY    . gastric banding undone  11/2014  . LAPAROSCOPIC CHOLECYSTECTOMY  ~ 2000  . LAPAROSCOPIC GASTRIC BANDING  2011  . LAPAROSCOPIC REVISION VENTRICULAR-PERITONEAL (V-P) SHUNT N/A 06/23/2017   Procedure: Shunt Placment stereotactic ventricular catheter placement and general surgery abdominal exposure  (Laparoscopic vs. Open) Brain Lab;  Surgeon: Clovis Riley, MD;  Location: [facility];  Service: General;  Laterality: N/A;  . LAPAROSCOPY N/A 06/23/2017   Procedure: LAPAROSCOPY DIAGNOSTIC;  Surgeon: Clovis Riley, MD;  Location: [facility];  Service: General;  Laterality: N/A;  . LUMBAR LAMINECTOMY/DECOMPRESSION MICRODISCECTOMY N/A 07/23/2016   Procedure: Decompressive Lumbar Laminectomy Lumbar two-three  for Repair of  Cerberal Spinal Fluid   Leak;  Surgeon: Kary Kos, MD;  Location: [facility];  Service: Neurosurgery;  Laterality: N/A;  . PLACEMENT LUMBOPERITONEAL SHUNT FOR CEREBROSPINAL FLUID DIVERSION AWAY FROM C8 PERINEURAL CYST  09-05-2003  &  12-24-2003   08-31-2003  REMOVAL SHUNT  . POSTERIOR CERVICAL LAMINECTOMY  03-01-2001   C7-8 and Exploration C8 perineural cyst  . POSTERIOR LUMBAR LAMINECTOMY L4-5/  DISKECTOMY L5-S1  05-20-2005  . RE-DO DECOMPRESSION LAMINECTOMY AND FUSION L4-5  &  L5-S1  12-20-2006   02-04-2007--  RE-EXPLORATION L4 to S1, I & D LUMBAR WOUND HEMATOMA  . REDUCTION MAMMAPLASTY    . SHUNT REMOVAL N/A 07/10/2016   Procedure: SHUNT REMOVAL;  Surgeon: Kary Kos, MD;  Location: [facility];  Service: Neurosurgery;  Laterality: N/A;  SHUNT REMOVAL  . SPINAL CORD STIMULATOR INSERTION N/A 04/26/2015   Procedure: LUMBAR SPINAL CORD STIMULATOR INSERTION;  Surgeon: Clydell Hakim, MD;  Location: [facility];  Service: Neurosurgery;  Laterality: N/A;  LUMBAR  SPINAL CORD STIMULATOR INSERTION  . TENNIS ELBOW RELEASE/NIRSCHEL PROCEDURE Left 12/13/2014   Procedure: LEFT ELBOW LATERAL EPICONDYLAR DEBRIDEMENT AND OR REPAIR -RECONSTRUCTION ;  Surgeon: Iran Planas, MD;  Location: [facility];  Service: Orthopedics;  Laterality: Left;  . TOTAL ABDOMINAL HYSTERECTOMY  1994   w/  Bilateral Salpinoophorectomy  . VENTRICULOPERITONEAL SHUNT N/A 06/23/2017   Procedure: VENTRICULAR-PERITONEAL SHUNT INSERTION WITH ABDOMINAL LAPARASCOPY AND BRAINLAB NAVIGATION;  Surgeon: Kary Kos, MD;  Location: [facility];  Service: Neurosurgery;  Laterality: N/A;   Social History   Tobacco Use  . Smoking status: Never Smoker  . Smokeless tobacco: Never Used  Substance Use Topics  . Alcohol use: No   family history includes Colon cancer in her father; Depression in her unknown relative; Ovarian cancer in her maternal grandmother; Pancreatic cancer in her maternal grandfather.  Medications: Current Outpatient Medications  Medication Sig Dispense  Refill  . ARIPiprazole (ABILIFY) 5 MG tablet TAKE 1 TABLET BY MOUTH EVERYDAY AT BEDTIME 90 tablet 1  . clindamycin (CLEOCIN) 300 MG capsule Take 1 capsule (300 mg total) by mouth 3 (three) times daily. 21 capsule 0  . DULoxetine (CYMBALTA) 60 MG capsule TAKE 2 CAPSULES BY MOUTH DAILY 60 capsule 2  . furosemide (LASIX) 40 MG tablet TAKE 1 TABLET BY MOUTH TWICE A DAY 60 tablet 0  . levETIRAcetam (KEPPRA) 500 MG tablet Take 500 mg by mouth 2 (two) times daily.     . methadone (DOLOPHINE) 5 MG tablet Take 2.5 mg by mouth every morning.   0  . montelukast (SINGULAIR) 10 MG tablet TAKE 1 TABLET (10 MG TOTAL) BY MOUTH AT BEDTIME. 30 tablet 5  . pramipexole (MIRAPEX) 0.5 MG tablet Take 1 tablet (0.5 mg total) by mouth at bedtime. 90 tablet 0  . simvastatin (ZOCOR) 20 MG tablet TAKE 1 TABLET BY MOUTH DAILY. 90 tablet 1  . tiZANidine (ZANAFLEX) 4 MG capsule Take 4-8 mg by mouth 2 (two) times daily. ONE TAB AM - PRN AND 2 TABS AT BEDTIME    . traMADol (ULTRAM) 50 MG tablet Take 50-100 mg by mouth at bedtime.     . predniSONE (STERAPRED UNI-PAK 48 TAB) 5 MG (48) TBPK tablet 12 day dosepack po 48 tablet 0   No current facility-administered medications for this visit.    Allergies  Allergen Reactions  . Penicillins Anaphylaxis    Has patient had a PCN reaction causing immediate rash, facial/tongue/throat swelling, SOB or lightheadedness with hypotension: Yes Has patient had a PCN reaction causing severe rash involving mucus membranes or skin necrosis: No Has patient had a PCN reaction that required hospitalization No Has patient had a PCN reaction occurring within the last 10 years: No If all of the above answers are "NO", then may proceed with Cephalosporin use.   . Lactose Swelling and Rash    SWELLING REACTION UNSPECIFIED   . Chlorhexidine Other (See Comments)    Dermatitis - Allergic  . Tape Rash    Plastic tape, silicone tape      Discussed warning signs or symptoms. Please see discharge  instructions. Patient expresses understanding.

## 2018-03-17 NOTE — Patient Instructions (Addendum)
Thank you for coming in today.  Use the cam walker as needed for comfort.  You can buy on on [place] or we can get you one or from a medical supply will have them.   Use gell heel cups or silicone gel sleeves.  Use Ice massage.   Exercises stand on the balls of your feel and go from the up position to down position slowly. It should take 3-4 seconds.  Do about 30 rep 2-3x daily.   For the elbow use a pad on things your elbow is resting on or use an elbow pad.   Use a bulky sleeve or towel at bedtime to keep the elbow from bending fully with sleep.    Recheck if not improving.   Fill and take prednisone if needed.   Cubital Tunnel Syndrome Cubital tunnel syndrome is a condition that causes pain and weakness of the forearm and hand. This condition happens when one of the nerves (ulnar nerve) that runs alongside the elbow joint becomes irritated. What are the causes? Causes of this condition include:  Increased pressure on the ulnar nerve at the elbow, arm, or forearm. This can be caused by: ? Swollen tissues. ? Ligaments. ? Muscles. ? Poorly healed elbow fractures. ? Tumors in the elbow. These are usually noncancerous (benign). ? Scar tissue that develops in the elbow after an injury. ? Bony growths (spurs) near the ulnar nerve.  Stretching of the nerve due to loose elbow ligaments.  Trauma to the nerve at the elbow.  Repetitive elbow bending.  Certain medical conditions.  What increases the risk? This condition is more likely to develop in:  People who do manual labor that requires frequently bending the elbow.  People who play sports that include repeated or strenuous throwing motions, such as baseball.  People who play contact sports, such as football or lacrosse.  People who do not warm up properly before activities.  People who have diabetes.  People who have an underactive thyroid (hypothyroidism).  What are the signs or symptoms? Symptoms of this  condition include:  Clumsiness or weakness of the hand.  Tenderness of the inner elbow.  Aching or soreness of the inner elbow, forearm, or fingers, especially the little finger or the ring finger.  Increased pain with forced elbow bending.  Reduced control when throwing.  Tingling, numbness, or burning inside the forearm, or in part of the hand or fingers, especially the little finger or the ring finger.  Sharp pains that shoot from the elbow down to the wrist and hand.  The inability to grip or pinch hard.  How is this diagnosed? This condition is diagnosed with a medical history and physical exam. Your health care provider will ask about your symptoms and ask for details about any injury. You may also have other tests, including:  Electromyogram (EMG). This test checks how well the nerve is working.  X-ray.  How is this treated? Treatment starts by stopping the activities that are causing your symptoms to get worse. Treatment may include the use of icing and medicines to reduce pain and swelling. You may also be advised to wear a splint to prevent your elbow from bending or wear an elbow pad where the ulnar nerve is closest to the skin. In less severe cases, treatment may also include working with a physical therapist:  To help decrease your symptoms.  To improve the strength and range of motion of your elbow, forearm, and hand.  If the treatments described  above do not help, surgery may be needed. Follow these instructions at home: If you have a splint:  Wear it as told by your health care provider. Remove it only as told by your health care provider.  Loosen the splint if your fingers become numb and tingle, or if they turn cold and blue.  Keep the splint clean and dry. Managing pain, stiffness, and swelling  If directed, apply ice to the injured area: ? Put ice in a plastic bag. ? Place a towel between your skin and the bag. ? Leave the ice on for 20 minutes, 2-3  times per day.  Move your fingers often to avoid stiffness and to lessen swelling.  Raise (elevate) the injured area above the level of your heart while you are sitting or lying down. General instructions  Take over-the-counter and prescription medicines only as told by your health care provider.  Keep all follow-up visits as told by your health care provider. This is important.  Do any exercise or physical therapy as told by your health care provider.  Do not drive or operate heavy machinery while taking prescription pain medicine.  If you were given an elbow pad, wear it as told by your health care provider. Contact a health care provider if:  Your symptoms get worse.  Your symptoms do not get better with treatment.  Your have new pain.  Your hand on the injured side feels numb or cold. This information is not intended to replace advice given to you by your health care provider. Make sure you discuss any questions you have with your health care provider. Document Released: 05/04/2005 Document Revised: 10/10/2015 Document Reviewed: 07/11/2014 Elsevier Interactive Patient Education  [employer].

## 2018-03-21 ENCOUNTER — Ambulatory Visit: Payer: Medicare HMO | Admitting: Psychology

## 2018-03-21 DIAGNOSIS — F4323 Adjustment disorder with mixed anxiety and depressed mood: Secondary | ICD-10-CM | POA: Diagnosis not present

## 2018-03-21 DIAGNOSIS — R69 Illness, unspecified: Secondary | ICD-10-CM | POA: Diagnosis not present

## 2018-03-23 ENCOUNTER — Other Ambulatory Visit: Payer: Self-pay | Admitting: Family Medicine

## 2018-04-01 ENCOUNTER — Other Ambulatory Visit: Payer: Self-pay | Admitting: Family Medicine

## 2018-04-16 ENCOUNTER — Other Ambulatory Visit: Payer: Self-pay | Admitting: Family Medicine

## 2018-04-20 ENCOUNTER — Ambulatory Visit: Payer: Medicare HMO | Admitting: Psychology

## 2018-04-20 DIAGNOSIS — R69 Illness, unspecified: Secondary | ICD-10-CM | POA: Diagnosis not present

## 2018-04-20 DIAGNOSIS — F4322 Adjustment disorder with anxiety: Secondary | ICD-10-CM | POA: Diagnosis not present

## 2018-04-27 ENCOUNTER — Other Ambulatory Visit: Payer: Self-pay | Admitting: Family Medicine

## 2018-05-04 ENCOUNTER — Ambulatory Visit (INDEPENDENT_AMBULATORY_CARE_PROVIDER_SITE_OTHER): Payer: Medicare HMO | Admitting: Family Medicine

## 2018-05-04 ENCOUNTER — Encounter: Payer: Self-pay | Admitting: Family Medicine

## 2018-05-04 VITALS — BP 120/82 | HR 106 | Temp 98.2°F | Ht 63.0 in | Wt 190.0 lb

## 2018-05-04 DIAGNOSIS — R05 Cough: Secondary | ICD-10-CM | POA: Diagnosis not present

## 2018-05-04 DIAGNOSIS — R059 Cough, unspecified: Secondary | ICD-10-CM

## 2018-05-04 MED ORDER — AZITHROMYCIN 250 MG PO TABS: 250.0000 mg | ORAL_TABLET | Freq: Every day | ORAL | 0 refills | Status: DC

## 2018-05-04 MED ORDER — PREDNISONE 10 MG PO TABS: 30.0000 mg | ORAL_TABLET | Freq: Every day | ORAL | 0 refills | Status: DC

## 2018-05-04 MED ORDER — GUAIFENESIN-CODEINE 100-10 MG/5ML PO SOLN: 5.0000 mL | Freq: Four times a day (QID) | ORAL | 0 refills | Status: DC | PRN

## 2018-05-04 MED ORDER — BENZONATATE 200 MG PO CAPS: 200.0000 mg | ORAL_CAPSULE | Freq: Three times a day (TID) | ORAL | 0 refills | Status: DC | PRN

## 2018-05-04 NOTE — Patient Instructions (Signed)
Thank you for coming in today. You have several over the counter medicines you can use.  I recommend muccinex DM.  Often cough drops with menthol can help a lot. Sugar free.  Try the tessalon pears for cough as needed.   If not controlling cough ok to fill and take codeine cough medcine.  Inform your pain doctor about it.   If worse fill and take azithromycin antibiotic.   If not better fill and take prednisone.    Acute Bronchitis, Adult  Acute bronchitis is sudden (acute) swelling of the air tubes (bronchi) in the lungs. Acute bronchitis causes these tubes to fill with mucus, which can make it hard to breathe. It can also cause coughing or wheezing. In adults, acute bronchitis usually goes away within 2 weeks. A cough caused by bronchitis may last up to 3 weeks. Smoking, allergies, and asthma can make the condition worse. Repeated episodes of bronchitis may cause further lung problems, such as chronic obstructive pulmonary disease (COPD). What are the causes? This condition can be caused by germs and by substances that irritate the lungs, including:  Cold and flu viruses. This condition is most often caused by the same virus that causes a cold.  Bacteria.  Exposure to tobacco smoke, dust, fumes, and air pollution. What increases the risk? This condition is more likely to develop in people who:  Have close contact with someone with acute bronchitis.  Are exposed to lung irritants, such as tobacco smoke, dust, fumes, and vapors.  Have a weak immune system.  Have a respiratory condition such as asthma. What are the signs or symptoms? Symptoms of this condition include:  A cough.  Coughing up clear, yellow, or green mucus.  Wheezing.  Chest congestion.  Shortness of breath.  A fever.  Body aches.  Chills.  A sore throat. How is this diagnosed? This condition is usually diagnosed with a physical exam. During the exam, your health care provider may order tests,  such as chest X-rays, to rule out other conditions. He or she may also:  Test a sample of your mucus for bacterial infection.  Check the level of oxygen in your blood. This is done to check for pneumonia.  Do a chest X-ray or lung function testing to rule out pneumonia and other conditions.  Perform blood tests. Your health care provider will also ask about your symptoms and medical history. How is this treated? Most cases of acute bronchitis clear up over time without treatment. Your health care provider may recommend:  Drinking more fluids. Drinking more makes your mucus thinner, which may make it easier to breathe.  Taking a medicine for a fever or cough.  Taking an antibiotic medicine.  Using an inhaler to help improve shortness of breath and to control a cough.  Using a cool mist vaporizer or humidifier to make it easier to breathe. Follow these instructions at home: Medicines  Take over-the-counter and prescription medicines only as told by your health care provider.  If you were prescribed an antibiotic, take it as told by your health care provider. Do not stop taking the antibiotic even if you start to feel better. General instructions   Get plenty of rest.  Drink enough fluids to keep your urine pale yellow.  Avoid smoking and secondhand smoke. Exposure to cigarette smoke or irritating chemicals will make bronchitis worse. If you smoke and you need help quitting, ask your health care provider. Quitting smoking will help your lungs heal faster.  Use  an inhaler, cool mist vaporizer, or humidifier as told by your health care provider.  Keep all follow-up visits as told by your health care provider. This is important. How is this prevented? To lower your risk of getting this condition again:  Wash your hands often with soap and water. If soap and water are not available, use hand sanitizer.  Avoid contact with people who have cold symptoms.  Try not to touch your  hands to your mouth, nose, or eyes.  Make sure to get the flu shot every year. Contact a health care provider if:  Your symptoms do not improve in 2 weeks of treatment. Get help right away if:  You cough up blood.  You have chest pain.  You have severe shortness of breath.  You become dehydrated.  You faint or keep feeling like you are going to faint.  You keep vomiting.  You have a severe headache.  Your fever or chills gets worse. This information is not intended to replace advice given to you by your health care provider. Make sure you discuss any questions you have with your health care provider. Document Released: 06/11/2004 Document Revised: 12/16/2016 Document Reviewed: 10/23/2015 Elsevier Interactive Patient Education  2019 [employer].

## 2018-05-04 NOTE — Progress Notes (Signed)
Katrina Fernandez is a 52 y.o. female who presents to [place]: [place] today for cough and congestion.  Symptoms present for 3 to 4 days.  Patient notes she has had sick contacts with her daughter who is suffering from a viral illness.  She has cough is quite obnoxious and bothersome and does interfere sleep.  She notes cough is nonproductive.  No shortness of breath fevers or chills.  She notes mild sore throat.  She is tried some over-the-counter medications very intermittently which have helped a little.  She is not tried much cough medications yet.  No chest pain palpitations or shortness of breath.  No significant wheezing.  No history of asthma.   ROS as above:  Exam:  BP 120/82   Pulse (!) 106   Temp 98.2 F (36.8 C) (Oral)   Ht 5\' 3"  (1.6 m)   Wt 190 lb (86.2 kg)   BMI 33.66 kg/m  Wt Readings from Last 5 Encounters:  05/04/18 190 lb (86.2 kg)  03/17/18 192 lb (87.1 kg)  03/01/18 191 lb (86.6 kg)  02/23/18 197 lb (89.4 kg)  01/13/18 195 lb (88.5 kg)    Gen: Well NAD HEENT: EOMI,  MMM posterior pharynx with mild cobblestoning.  Normal tympanic membranes.  No significant nasal drainage. Lungs: Normal work of breathing. CTABL Heart: RRR no MRG heart rate 90 bpm per my check Abd: NABS, Soft. Nondistended, Nontender Exts: Brisk capillary refill, warm and well perfused.   Lab and Radiology Results No results found for this or any previous visit (from the past 72 hour(s)). No results found.    Assessment and Plan: 52 y.o. female with  Cough and congestion likely viral bronchitis at this time.  Plan for over-the-counter medications for cough control and daily Mucinex DM and menthol cough drops.  Additionally will use prescription Tessalon Perles. If not improved I have printed a backup prescription for codeine containing cough medication as needed.  Discussed  with patient that it is important if she takes this medication to inform her pain management clinic.  Additionally I have prescribed and printed a backup prescription for prednisone and azithromycin.  Patient will take these if not improved or if worsening.  She understands not to take the azithromycin at this time as it likely will not be helpful.  CC: [facility]  Patient researched [facility] Controlled Substance Reporting System.  No orders of the defined types were placed in this encounter.  Meds ordered this encounter  Medications  . benzonatate (TESSALON) 200 MG capsule    Sig: Take 1 capsule (200 mg total) by mouth 3 (three) times daily as needed for cough.    Dispense:  45 capsule    Refill:  0     Historical information moved to improve visibility of documentation.  Past Medical History:  Diagnosis Date  . Anemia   . Anxiety   . Arthritis    "back and knees" (07/23/2016)  . Chiari malformation type I ([place]) dx'd 2014  . Chronic back pain    buldging, DDD; "neck and lower back" (07/23/2016)  . Chronic constipation    takes Miralax daily  . Chronic pain syndrome    takes Methadone and Keppra daily  . Depression    takes Effexor daily  . Endometriosis    1995  . Fibromyalgia   . GERD (gastroesophageal reflux disease)    not taking anything at the present time  .  History of DVT of lower extremity 07/2012   LEFT LEG --  3/ 2014.Was on a blood transfusion for 6 months  . History of kidney stones   . History of MRSA infection 10 yrs ago  . Hyperlipidemia    takes Simvastatin daily  . Migraine    "daily recently" (07/23/2016)  . Muscle spasm    takes Zanaflex nightly  . Peripheral edema    takes Lasix daily as needed  . Peripheral neuropathy   . Pneumonia    hx of-7-8 yrs ago  . PONV (postoperative nausea and vomiting)   . Seasonal allergies    takes Claritin daily  . Spinal headache    "since 2002; still get them; try to manage it w/lying flat"    . Weakness    and numbness in right hand   Past Surgical History:  Procedure Laterality Date  . ANTERIOR CERVICAL DECOMP/DISCECTOMY FUSION  07-25-2009   C5 -- C6  . APPENDECTOMY  age 59  . APPLICATION OF CRANIAL NAVIGATION N/A 06/23/2017   Procedure: APPLICATION OF CRANIAL NAVIGATION;  Surgeon: Kary Kos, MD;  Location: [place];  Service: Neurosurgery;  Laterality: N/A;  . BACK SURGERY    . BREAST REDUCTION SURGERY Bilateral 06/07/2013   Procedure: BILATERAL MAMMARY REDUCTION  (BREAST);  Surgeon: Theodoro Kos, DO;  Location: [facility];  Service: Plastics;  Laterality: Bilateral;  . BREAST REDUCTION SURGERY Bilateral 06/07/2013   Procedure:  LIPOSUCTION;  Surgeon: Theodoro Kos, DO;  Location: [facility];  Service: Plastics;  Laterality: Bilateral;  . CARPAL TUNNEL RELEASE Bilateral 2001  . COLONOSCOPY  X 2   "polyps were benign"  . DILATION AND CURETTAGE OF UTERUS    . ESOPHAGOGASTRODUODENOSCOPY    . gastric banding undone  11/2014  . LAPAROSCOPIC CHOLECYSTECTOMY  ~ 2000  . LAPAROSCOPIC GASTRIC BANDING  2011  . LAPAROSCOPIC REVISION VENTRICULAR-PERITONEAL (V-P) SHUNT N/A 06/23/2017   Procedure: Shunt Placment stereotactic ventricular catheter placement and general surgery abdominal exposure  (Laparoscopic vs. Open) Brain Lab;  Surgeon: Clovis Riley, MD;  Location: [place];  Service: General;  Laterality: N/A;  . LAPAROSCOPY N/A 06/23/2017   Procedure: LAPAROSCOPY DIAGNOSTIC;  Surgeon: Clovis Riley, MD;  Location: [place];  Service: General;  Laterality: N/A;  . LUMBAR LAMINECTOMY/DECOMPRESSION MICRODISCECTOMY N/A 07/23/2016   Procedure: Decompressive Lumbar Laminectomy Lumbar two-three  for Repair of  Cerberal Spinal Fluid  Leak;  Surgeon: Kary Kos, MD;  Location: [place];  Service: Neurosurgery;  Laterality: N/A;  . PLACEMENT LUMBOPERITONEAL SHUNT FOR CEREBROSPINAL FLUID DIVERSION AWAY FROM C8 PERINEURAL CYST  09-05-2003  &  12-24-2003   08-31-2003  REMOVAL  SHUNT  . POSTERIOR CERVICAL LAMINECTOMY  03-01-2001   C7-8 and Exploration C8 perineural cyst  . POSTERIOR LUMBAR LAMINECTOMY L4-5/  DISKECTOMY L5-S1  05-20-2005  . RE-DO DECOMPRESSION LAMINECTOMY AND FUSION L4-5  &  L5-S1  12-20-2006   02-04-2007--  RE-EXPLORATION L4 to S1, I & D LUMBAR WOUND HEMATOMA  . REDUCTION MAMMAPLASTY    . SHUNT REMOVAL N/A 07/10/2016   Procedure: SHUNT REMOVAL;  Surgeon: Kary Kos, MD;  Location: [facility];  Service: Neurosurgery;  Laterality: N/A;  SHUNT REMOVAL  . SPINAL CORD STIMULATOR INSERTION N/A 04/26/2015   Procedure: LUMBAR SPINAL CORD STIMULATOR INSERTION;  Surgeon: Clydell Hakim, MD;  Location: [place];  Service: Neurosurgery;  Laterality: N/A;  LUMBAR SPINAL CORD STIMULATOR INSERTION  . TENNIS ELBOW RELEASE/NIRSCHEL PROCEDURE Left 12/13/2014   Procedure: LEFT ELBOW LATERAL EPICONDYLAR  DEBRIDEMENT AND OR REPAIR -RECONSTRUCTION ;  Surgeon: Iran Planas, MD;  Location: [facility];  Service: Orthopedics;  Laterality: Left;  . TOTAL ABDOMINAL HYSTERECTOMY  1994   w/  Bilateral Salpinoophorectomy  . VENTRICULOPERITONEAL SHUNT N/A 06/23/2017   Procedure: VENTRICULAR-PERITONEAL SHUNT INSERTION WITH ABDOMINAL LAPARASCOPY AND BRAINLAB NAVIGATION;  Surgeon: Kary Kos, MD;  Location: [facility];  Service: Neurosurgery;  Laterality: N/A;   Social History   Tobacco Use  . Smoking status: Never Smoker  . Smokeless tobacco: Never Used  Substance Use Topics  . Alcohol use: No   family history includes Colon cancer in her father; Depression in her unknown relative; Ovarian cancer in her maternal grandmother; Pancreatic cancer in her maternal grandfather.  Medications: Current Outpatient Medications  Medication Sig Dispense Refill  . ARIPiprazole (ABILIFY) 5 MG tablet TAKE 1 TABLET BY MOUTH EVERYDAY AT BEDTIME 90 tablet 1  . DULoxetine (CYMBALTA) 60 MG capsule TAKE 2 CAPSULES BY MOUTH DAILY 60 capsule 2  . furosemide (LASIX) 40 MG tablet TAKE 1 TABLET BY  MOUTH TWICE A DAY 180 tablet 1  . levETIRAcetam (KEPPRA) 500 MG tablet Take 500 mg by mouth 2 (two) times daily.     . methadone (DOLOPHINE) 5 MG tablet Take 2.5 mg by mouth every morning.   0  . montelukast (SINGULAIR) 10 MG tablet TAKE 1 TABLET (10 MG TOTAL) BY MOUTH AT BEDTIME. 90 tablet 1  . pramipexole (MIRAPEX) 0.5 MG tablet TAKE 1 TABLET (0.5 MG TOTAL) BY MOUTH AT BEDTIME. 90 tablet 0  . simvastatin (ZOCOR) 20 MG tablet TAKE 1 TABLET BY MOUTH DAILY. 90 tablet 1  . tiZANidine (ZANAFLEX) 4 MG capsule Take 4-8 mg by mouth 2 (two) times daily. ONE TAB AM - PRN AND 2 TABS AT BEDTIME    . traMADol (ULTRAM) 50 MG tablet Take 50-100 mg by mouth at bedtime.     . benzonatate (TESSALON) 200 MG capsule Take 1 capsule (200 mg total) by mouth 3 (three) times daily as needed for cough. 45 capsule 0   No current facility-administered medications for this visit.    Allergies  Allergen Reactions  . Penicillins Anaphylaxis    Has patient had a PCN reaction causing immediate rash, facial/tongue/throat swelling, SOB or lightheadedness with hypotension: Yes Has patient had a PCN reaction causing severe rash involving mucus membranes or skin necrosis: No Has patient had a PCN reaction that required hospitalization No Has patient had a PCN reaction occurring within the last 10 years: No If all of the above answers are "NO", then may proceed with Cephalosporin use.   . Lactose Swelling and Rash    SWELLING REACTION UNSPECIFIED   . Chlorhexidine Other (See Comments)    Dermatitis - Allergic  . Tape Rash    Plastic tape, silicone tape     Discussed warning signs or symptoms. Please see discharge instructions. Patient expresses understanding.

## 2018-05-05 ENCOUNTER — Ambulatory Visit: Payer: Medicare HMO | Admitting: Psychology

## 2018-05-06 DIAGNOSIS — M7061 Trochanteric bursitis, right hip: Secondary | ICD-10-CM | POA: Diagnosis not present

## 2018-05-06 DIAGNOSIS — M961 Postlaminectomy syndrome, not elsewhere classified: Secondary | ICD-10-CM | POA: Diagnosis not present

## 2018-05-06 DIAGNOSIS — Z79899 Other long term (current) drug therapy: Secondary | ICD-10-CM | POA: Diagnosis not present

## 2018-05-06 DIAGNOSIS — M7712 Lateral epicondylitis, left elbow: Secondary | ICD-10-CM | POA: Diagnosis not present

## 2018-06-02 ENCOUNTER — Ambulatory Visit: Payer: Medicare HMO | Admitting: Psychology

## 2018-06-09 ENCOUNTER — Ambulatory Visit: Payer: Medicare HMO | Admitting: Psychology

## 2018-06-11 ENCOUNTER — Encounter: Payer: Self-pay | Admitting: Family Medicine

## 2018-06-14 ENCOUNTER — Other Ambulatory Visit: Payer: Self-pay | Admitting: Family Medicine

## 2018-06-23 DIAGNOSIS — Z86010 Personal history of colonic polyps: Secondary | ICD-10-CM | POA: Diagnosis not present

## 2018-06-23 DIAGNOSIS — R109 Unspecified abdominal pain: Secondary | ICD-10-CM | POA: Diagnosis not present

## 2018-06-23 DIAGNOSIS — Z9049 Acquired absence of other specified parts of digestive tract: Secondary | ICD-10-CM | POA: Diagnosis not present

## 2018-06-23 DIAGNOSIS — R197 Diarrhea, unspecified: Secondary | ICD-10-CM | POA: Diagnosis not present

## 2018-06-23 DIAGNOSIS — K921 Melena: Secondary | ICD-10-CM | POA: Diagnosis not present

## 2018-06-23 DIAGNOSIS — K625 Hemorrhage of anus and rectum: Secondary | ICD-10-CM | POA: Diagnosis not present

## 2018-06-23 DIAGNOSIS — K5900 Constipation, unspecified: Secondary | ICD-10-CM | POA: Diagnosis not present

## 2018-06-24 LAB — HEPATIC FUNCTION PANEL
ALT: 14 (ref 7–35)
AST: 21 (ref 13–35)
Alkaline Phosphatase: 117 (ref 25–125)
Bilirubin, Total: 0.3

## 2018-06-24 LAB — BASIC METABOLIC PANEL
BUN: 19 (ref 4–21)
Creatinine: 0.8 (ref 0.5–1.1)
Glucose: 79
Potassium: 4 (ref 3.4–5.3)
Sodium: 144 (ref 137–147)

## 2018-06-24 LAB — CBC AND DIFFERENTIAL
HCT: 42 (ref 36–46)
Hemoglobin: 13.9 (ref 12.0–16.0)
Platelets: 281 (ref 150–399)
WBC: 7.6

## 2018-06-24 LAB — TSH: TSH: 1.07 (ref 0.41–5.90)

## 2018-06-26 ENCOUNTER — Other Ambulatory Visit: Payer: Self-pay | Admitting: Family Medicine

## 2018-06-27 ENCOUNTER — Ambulatory Visit (INDEPENDENT_AMBULATORY_CARE_PROVIDER_SITE_OTHER): Payer: Medicare HMO | Admitting: Family Medicine

## 2018-06-27 ENCOUNTER — Encounter: Payer: Self-pay | Admitting: Family Medicine

## 2018-06-27 VITALS — BP 109/87 | HR 103 | Temp 98.0°F | Ht 63.0 in | Wt 188.0 lb

## 2018-06-27 DIAGNOSIS — Z1239 Encounter for other screening for malignant neoplasm of breast: Secondary | ICD-10-CM | POA: Diagnosis not present

## 2018-06-27 DIAGNOSIS — J029 Acute pharyngitis, unspecified: Secondary | ICD-10-CM

## 2018-06-27 LAB — POCT RAPID STREP A (OFFICE): Rapid Strep A Screen: NEGATIVE

## 2018-06-27 MED ORDER — CLINDAMYCIN HCL 300 MG PO CAPS: 300.0000 mg | ORAL_CAPSULE | Freq: Three times a day (TID) | ORAL | 0 refills | Status: DC

## 2018-06-27 NOTE — Progress Notes (Signed)
Katrina Fernandez is a 53 y.o. female who presents to [place]: [place] today for sore throat. Yesterday, Katrina Fernandez started having a sore throat and noticed white patches on the back of her throat. She's also had runny nose, body aches, and cold sweats. Denies fever, congestion, cough, and abdominal pain. She has a history of strep and tonsillitis and notes that it feels similar to both. Has not tried any OTC medications. She has had sick contacts - her daughter had a stomach bug this week and she's been at the pediatrician office with her.   Of note, she has recently had blood clots in her stool and has a colonoscopy scheduled on Monday.  She had labs at gastroenterology last week.   ROS as above:  Exam:  BP 109/87   Pulse (!) 103   Temp 98 F (36.7 C) (Oral)   Ht 5\' 3"  (1.6 m)   Wt 188 lb (85.3 kg)   SpO2 100%   BMI 33.30 kg/m  Wt Readings from Last 5 Encounters:  06/27/18 188 lb (85.3 kg)  05/04/18 190 lb (86.2 kg)  03/17/18 192 lb (87.1 kg)  03/01/18 191 lb (86.6 kg)  02/23/18 197 lb (89.4 kg)    Gen: Well NAD HEENT: EOMI,  MMM, TM NL BL. Small white patches present in oropharynx. Tender cervical lymphadenopathy. Lungs: Normal work of breathing. CTABL Heart: RRR no MRG Abd: NABS, Soft. Nondistended, Nontender Exts: Brisk capillary refill, warm and well perfused.   Lab and Radiology Results Labs from gastroenterology last week and from today. Results for orders placed or performed in visit on 06/27/18 (from the past 72 hour(s))  POCT rapid strep A     Status: None   Collection Time: 06/27/18 10:06 AM  Result Value Ref Range   Rapid Strep A Screen Negative Negative   Lab Results  Component Value Date   WBC 7.6 06/24/2018   HGB 13.9 06/24/2018   HCT 42 06/24/2018   MCV 89.0 06/21/2017   PLT 281 06/24/2018     Chemistry      Component Value  Date/Time   NA 144 06/24/2018   K 4.0 06/24/2018   CL 101 06/21/2017 1445   CO2 28 06/21/2017 1445   BUN 19 06/24/2018   CREATININE 0.8 06/24/2018   CREATININE 0.81 06/21/2017 1445   CREATININE 0.75 03/31/2017 0954   GLU 79 06/24/2018      Component Value Date/Time   CALCIUM 9.3 06/21/2017 1445   ALKPHOS 117 06/24/2018   AST 21 06/24/2018   ALT 14 06/24/2018   BILITOT 0.4 03/31/2017 0954     Lab Results  Component Value Date   TSH 1.07 06/24/2018      Assessment and Plan: 53 y.o. female with  Sore throat: Mrs. Katrina Fernandez has had a sore throat, white patches in her throat, body aches, and runny nose since yesterday. White patches present in her oropharynx, but strep test returned negative. Will treat her for strep since she feels this is similar to her previous case of strep. She is allergic to penicillin. Prescribed clindamycin for one week. Continue Tylenol for pain. She will let me know on Friday if not improving, as her colonoscopy may need to be rescheduled.    Health maintenance: She will schedule her mammogram.   Labs updated.  PDMP not reviewed this encounter. Orders Placed This Encounter  Procedures  . MM 3D SCREEN BREAST BILATERAL  Standing Status:   Future    Standing Expiration Date:   08/26/2019    Order Specific Question:   Reason for Exam (SYMPTOM  OR DIAGNOSIS REQUIRED)    Answer:   screen breast cancer    Order Specific Question:   Is the patient pregnant?    Answer:   No    Order Specific Question:   Preferred imaging location?    Answer:   [facility]  . CBC and differential    This external order was created through the Results Console.  . Basic metabolic panel    This external order was created through the Results Console.  . Hepatic function panel    This external order was created through the Results Console.  . TSH    This external order was created through the Results Console.  [facility] POCT rapid strep A   Meds ordered this encounter   Medications  . clindamycin (CLEOCIN) 300 MG capsule    Sig: Take 1 capsule (300 mg total) by mouth 3 (three) times daily.    Dispense:  21 capsule    Refill:  0     Historical information moved to improve visibility of documentation.  Past Medical History:  Diagnosis Date  . Anemia   . Anxiety   . Arthritis    "back and knees" (07/23/2016)  . Chiari malformation type I ([place]) dx'd 2014  . Chronic back pain    buldging, DDD; "neck and lower back" (07/23/2016)  . Chronic constipation    takes Miralax daily  . Chronic pain syndrome    takes Methadone and Keppra daily  . Depression    takes Effexor daily  . Endometriosis    1995  . Fibromyalgia   . GERD (gastroesophageal reflux disease)    not taking anything at the present time  . History of DVT of lower extremity 07/2012   LEFT LEG --  3/ 2014.Was on a blood transfusion for 6 months  . History of kidney stones   . History of MRSA infection 10 yrs ago  . Hyperlipidemia    takes Simvastatin daily  . Migraine    "daily recently" (07/23/2016)  . Muscle spasm    takes Zanaflex nightly  . Peripheral edema    takes Lasix daily as needed  . Peripheral neuropathy   . Pneumonia    hx of-7-8 yrs ago  . PONV (postoperative nausea and vomiting)   . Seasonal allergies    takes Claritin daily  . Spinal headache    "since 2002; still get them; try to manage it w/lying flat"  . Weakness    and numbness in right hand   Past Surgical History:  Procedure Laterality Date  . ANTERIOR CERVICAL DECOMP/DISCECTOMY FUSION  07-25-2009   C5 -- C6  . APPENDECTOMY  age 38  . APPLICATION OF CRANIAL NAVIGATION N/A 06/23/2017   Procedure: APPLICATION OF CRANIAL NAVIGATION;  Surgeon: Kary Kos, MD;  Location: [facility];  Service: Neurosurgery;  Laterality: N/A;  . BACK SURGERY    . BREAST REDUCTION SURGERY Bilateral 06/07/2013   Procedure: BILATERAL MAMMARY REDUCTION  (BREAST);  Surgeon: Theodoro Kos, DO;  Location: [facility];  Service:  Plastics;  Laterality: Bilateral;  . BREAST REDUCTION SURGERY Bilateral 06/07/2013   Procedure:  LIPOSUCTION;  Surgeon: Theodoro Kos, DO;  Location: [facility];  Service: Plastics;  Laterality: Bilateral;  . CARPAL TUNNEL RELEASE Bilateral 2001  . COLONOSCOPY  X 2   "polyps were benign"  .  DILATION AND CURETTAGE OF UTERUS    . ESOPHAGOGASTRODUODENOSCOPY    . gastric banding undone  11/2014  . LAPAROSCOPIC CHOLECYSTECTOMY  ~ 2000  . LAPAROSCOPIC GASTRIC BANDING  2011  . LAPAROSCOPIC REVISION VENTRICULAR-PERITONEAL (V-P) SHUNT N/A 06/23/2017   Procedure: Shunt Placment stereotactic ventricular catheter placement and general surgery abdominal exposure  (Laparoscopic vs. Open) Brain Lab;  Surgeon: Clovis Riley, MD;  Location: [facility];  Service: General;  Laterality: N/A;  . LAPAROSCOPY N/A 06/23/2017   Procedure: LAPAROSCOPY DIAGNOSTIC;  Surgeon: Clovis Riley, MD;  Location: [facility];  Service: General;  Laterality: N/A;  . LUMBAR LAMINECTOMY/DECOMPRESSION MICRODISCECTOMY N/A 07/23/2016   Procedure: Decompressive Lumbar Laminectomy Lumbar two-three  for Repair of  Cerberal Spinal Fluid  Leak;  Surgeon: Kary Kos, MD;  Location: [facility];  Service: Neurosurgery;  Laterality: N/A;  . PLACEMENT LUMBOPERITONEAL SHUNT FOR CEREBROSPINAL FLUID DIVERSION AWAY FROM C8 PERINEURAL CYST  09-05-2003  &  12-24-2003   08-31-2003  REMOVAL SHUNT  . POSTERIOR CERVICAL LAMINECTOMY  03-01-2001   C7-8 and Exploration C8 perineural cyst  . POSTERIOR LUMBAR LAMINECTOMY L4-5/  DISKECTOMY L5-S1  05-20-2005  . RE-DO DECOMPRESSION LAMINECTOMY AND FUSION L4-5  &  L5-S1  12-20-2006   02-04-2007--  RE-EXPLORATION L4 to S1, I & D LUMBAR WOUND HEMATOMA  . REDUCTION MAMMAPLASTY    . SHUNT REMOVAL N/A 07/10/2016   Procedure: SHUNT REMOVAL;  Surgeon: Kary Kos, MD;  Location: [facility];  Service: Neurosurgery;  Laterality: N/A;  SHUNT REMOVAL  . SPINAL CORD STIMULATOR INSERTION N/A 04/26/2015   Procedure: LUMBAR SPINAL  CORD STIMULATOR INSERTION;  Surgeon: Clydell Hakim, MD;  Location: [facility];  Service: Neurosurgery;  Laterality: N/A;  LUMBAR SPINAL CORD STIMULATOR INSERTION  . TENNIS ELBOW RELEASE/NIRSCHEL PROCEDURE Left 12/13/2014   Procedure: LEFT ELBOW LATERAL EPICONDYLAR DEBRIDEMENT AND OR REPAIR -RECONSTRUCTION ;  Surgeon: Iran Planas, MD;  Location: [facility];  Service: Orthopedics;  Laterality: Left;  . TOTAL ABDOMINAL HYSTERECTOMY  1994   w/  Bilateral Salpinoophorectomy  . VENTRICULOPERITONEAL SHUNT N/A 06/23/2017   Procedure: VENTRICULAR-PERITONEAL SHUNT INSERTION WITH ABDOMINAL LAPARASCOPY AND BRAINLAB NAVIGATION;  Surgeon: Kary Kos, MD;  Location: [facility];  Service: Neurosurgery;  Laterality: N/A;   Social History   Tobacco Use  . Smoking status: Never Smoker  . Smokeless tobacco: Never Used  Substance Use Topics  . Alcohol use: No   family history includes Colon cancer in her father; Depression in her unknown relative; Ovarian cancer in her maternal grandmother; Pancreatic cancer in her maternal grandfather.  Medications: Current Outpatient Medications  Medication Sig Dispense Refill  . ARIPiprazole (ABILIFY) 5 MG tablet TAKE 1 TABLET BY MOUTH EVERYDAY AT BEDTIME 90 tablet 1  . DULoxetine (CYMBALTA) 60 MG capsule TAKE 2 CAPSULES BY MOUTH DAILY 60 capsule 2  . furosemide (LASIX) 40 MG tablet TAKE 1 TABLET BY MOUTH TWICE A DAY 180 tablet 1  . levETIRAcetam (KEPPRA) 500 MG tablet Take 500 mg by mouth 2 (two) times daily.     . methadone (DOLOPHINE) 5 MG tablet Take 2.5 mg by mouth every morning.   0  . montelukast (SINGULAIR) 10 MG tablet TAKE 1 TABLET (10 MG TOTAL) BY MOUTH AT BEDTIME. 90 tablet 1  . pramipexole (MIRAPEX) 0.5 MG tablet TAKE 1 TABLET (0.5 MG TOTAL) BY MOUTH AT BEDTIME. 90 tablet 0  . simvastatin (ZOCOR) 20 MG tablet TAKE 1 TABLET BY MOUTH DAILY. 90 tablet 1  . tiZANidine (ZANAFLEX) 4 MG capsule Take  4-8 mg by mouth 2 (two) times daily. ONE TAB AM - PRN AND  2 TABS AT BEDTIME    . traMADol (ULTRAM) 50 MG tablet Take 50-100 mg by mouth at bedtime.     . clindamycin (CLEOCIN) 300 MG capsule Take 1 capsule (300 mg total) by mouth 3 (three) times daily. 21 capsule 0   No current facility-administered medications for this visit.    Allergies  Allergen Reactions  . Penicillins Anaphylaxis    Has patient had a PCN reaction causing immediate rash, facial/tongue/throat swelling, SOB or lightheadedness with hypotension: Yes Has patient had a PCN reaction causing severe rash involving mucus membranes or skin necrosis: No Has patient had a PCN reaction that required hospitalization No Has patient had a PCN reaction occurring within the last 10 years: No If all of the above answers are "NO", then may proceed with Cephalosporin use.   . Lactose Swelling and Rash    SWELLING REACTION UNSPECIFIED   . Chlorhexidine Other (See Comments)    Dermatitis - Allergic  . Tape Rash    Plastic tape, silicone tape     Discussed warning signs or symptoms. Please see discharge instructions. Patient expresses understanding.  I personally was present and performed or re-performed the history, physical exam and medical decision-making activities of this service and have verified that the service and findings are accurately documented in the student's note. ___________________________________________ Lynne Leader M.D., ABFM., CAQSM. Primary Care and Sports Medicine Adjunct Instructor of [employer]

## 2018-06-27 NOTE — Patient Instructions (Addendum)
Thank you for coming in today. Call or go to the emergency room if you get worse, have trouble breathing, have chest pains, or palpitations.   Take clindamycin three times daily for 1 week.  Continue tylenol for pain or fever.,   Recheck if not improving.   Let me and your gastroenterologists know if still pretty sick on Friday.     Pharyngitis  Pharyngitis is redness, pain, and swelling (inflammation) of the throat (pharynx). It is a very common cause of sore throat. Pharyngitis can be caused by a bacteria, but it is usually caused by a virus. Most cases of pharyngitis get better on their own without treatment. What are the causes? This condition may be caused by:  Infection by viruses (viral). Viral pharyngitis spreads from person to person (is contagious) through coughing, sneezing, and sharing of personal items or utensils such as cups, forks, spoons, and toothbrushes.  Infection by bacteria (bacterial). Bacterial pharyngitis may be spread by touching the nose or face after coming in contact with the bacteria, or through more intimate contact, such as kissing.  Allergies. Allergies can cause buildup of mucus in the throat (post-nasal drip), leading to inflammation and irritation. Allergies can also cause blocked nasal passages, forcing breathing through the mouth, which dries and irritates the throat. What increases the risk? You are more likely to develop this condition if:  You are 50-72 years old.  You are exposed to crowded environments such as daycare, school, or dormitory living.  You live in a cold climate.  You have a weakened disease-fighting (immune) system. What are the signs or symptoms? Symptoms of this condition vary by the cause (viral, bacterial, or allergies) and can include:  Sore throat.  Fatigue.  Low-grade fever.  Headache.  Joint pain and muscle aches.  Skin rashes.  Swollen glands in the throat (lymph nodes).  Plaque-like film on the throat  or tonsils. This is often a symptom of bacterial pharyngitis.  Vomiting.  Stuffy nose (nasal congestion).  Cough.  Red, itchy eyes (conjunctivitis).  Loss of appetite. How is this diagnosed? This condition is often diagnosed based on your medical history and a physical exam. Your health care provider will ask you questions about your illness and your symptoms. A swab of your throat may be done to check for bacteria (rapid strep test). Other lab tests may also be done, depending on the suspected cause, but these are rare. How is this treated? This condition usually gets better in 3-4 days without medicine. Bacterial pharyngitis may be treated with antibiotic medicines. Follow these instructions at home:  Take over-the-counter and prescription medicines only as told by your health care provider. ? If you were prescribed an antibiotic medicine, take it as told by your health care provider. Do not stop taking the antibiotic even if you start to feel better. ? Do not give children aspirin because of the association with Reye syndrome.  Drink enough water and fluids to keep your urine clear or pale yellow.  Get a lot of rest.  Gargle with a salt-water mixture 3-4 times a day or as needed. To make a salt-water mixture, completely dissolve -1 tsp of salt in 1 cup of warm water.  If your health care provider approves, you may use throat lozenges or sprays to soothe your throat. Contact a health care provider if:  You have large, tender lumps in your neck.  You have a rash.  You cough up green, yellow-brown, or bloody spit. Get help right  away if:  Your neck becomes stiff.  You drool or are unable to swallow liquids.  You cannot drink or take medicines without vomiting.  You have severe pain that does not go away, even after you take medicine.  You have trouble breathing, and it is not caused by a stuffy nose.  You have new pain and swelling in your joints such as the knees,  ankles, wrists, or elbows. Summary  Pharyngitis is redness, pain, and swelling (inflammation) of the throat (pharynx).  While pharyngitis can be caused by a bacteria, the most common causes are viral.  Most cases of pharyngitis get better on their own without treatment.  Bacterial pharyngitis is treated with antibiotic medicines. This information is not intended to replace advice given to you by your health care provider. Make sure you discuss any questions you have with your health care provider. Document Released: 05/04/2005 Document Revised: 06/09/2016 Document Reviewed: 06/09/2016 Elsevier Interactive Patient Education  2019 [employer].

## 2018-07-04 DIAGNOSIS — I10 Essential (primary) hypertension: Secondary | ICD-10-CM | POA: Diagnosis not present

## 2018-07-04 DIAGNOSIS — Z86718 Personal history of other venous thrombosis and embolism: Secondary | ICD-10-CM | POA: Diagnosis not present

## 2018-07-04 DIAGNOSIS — Z86010 Personal history of colonic polyps: Secondary | ICD-10-CM | POA: Diagnosis not present

## 2018-07-04 DIAGNOSIS — R69 Illness, unspecified: Secondary | ICD-10-CM | POA: Diagnosis not present

## 2018-07-04 DIAGNOSIS — K219 Gastro-esophageal reflux disease without esophagitis: Secondary | ICD-10-CM | POA: Diagnosis not present

## 2018-07-04 DIAGNOSIS — E785 Hyperlipidemia, unspecified: Secondary | ICD-10-CM | POA: Diagnosis not present

## 2018-07-04 DIAGNOSIS — M1990 Unspecified osteoarthritis, unspecified site: Secondary | ICD-10-CM | POA: Diagnosis not present

## 2018-07-04 DIAGNOSIS — K635 Polyp of colon: Secondary | ICD-10-CM | POA: Diagnosis not present

## 2018-07-04 DIAGNOSIS — D124 Benign neoplasm of descending colon: Secondary | ICD-10-CM | POA: Diagnosis not present

## 2018-07-04 DIAGNOSIS — G935 Compression of brain: Secondary | ICD-10-CM | POA: Diagnosis not present

## 2018-07-04 DIAGNOSIS — K921 Melena: Secondary | ICD-10-CM | POA: Diagnosis not present

## 2018-07-04 DIAGNOSIS — Z982 Presence of cerebrospinal fluid drainage device: Secondary | ICD-10-CM | POA: Diagnosis not present

## 2018-07-05 DIAGNOSIS — M7061 Trochanteric bursitis, right hip: Secondary | ICD-10-CM | POA: Diagnosis not present

## 2018-07-05 DIAGNOSIS — Z5181 Encounter for therapeutic drug level monitoring: Secondary | ICD-10-CM | POA: Diagnosis not present

## 2018-07-05 DIAGNOSIS — M79605 Pain in left leg: Secondary | ICD-10-CM | POA: Diagnosis not present

## 2018-07-05 DIAGNOSIS — M533 Sacrococcygeal disorders, not elsewhere classified: Secondary | ICD-10-CM | POA: Diagnosis not present

## 2018-07-05 DIAGNOSIS — Z79899 Other long term (current) drug therapy: Secondary | ICD-10-CM | POA: Diagnosis not present

## 2018-07-07 ENCOUNTER — Ambulatory Visit (INDEPENDENT_AMBULATORY_CARE_PROVIDER_SITE_OTHER): Payer: Medicare HMO

## 2018-07-07 DIAGNOSIS — Z1231 Encounter for screening mammogram for malignant neoplasm of breast: Secondary | ICD-10-CM | POA: Diagnosis not present

## 2018-07-07 DIAGNOSIS — Z1239 Encounter for other screening for malignant neoplasm of breast: Secondary | ICD-10-CM

## 2018-07-08 NOTE — Progress Notes (Signed)
Pt has seen results on MyChart.

## 2018-07-12 ENCOUNTER — Ambulatory Visit (INDEPENDENT_AMBULATORY_CARE_PROVIDER_SITE_OTHER): Payer: Medicare HMO | Admitting: Family Medicine

## 2018-07-12 VITALS — BP 137/84 | HR 88 | Temp 98.0°F | Ht 63.0 in | Wt 184.0 lb

## 2018-07-12 DIAGNOSIS — H9201 Otalgia, right ear: Secondary | ICD-10-CM | POA: Diagnosis not present

## 2018-07-12 MED ORDER — CLINDAMYCIN HCL 300 MG PO CAPS: 300.0000 mg | ORAL_CAPSULE | Freq: Three times a day (TID) | ORAL | 0 refills | Status: DC

## 2018-07-12 MED ORDER — PREDNISONE 10 MG PO TABS: 30.0000 mg | ORAL_TABLET | Freq: Every day | ORAL | 0 refills | Status: DC

## 2018-07-12 NOTE — Progress Notes (Signed)
Katrina Fernandez is a 53 y.o. female who presents to [facility]: [facility] today for right ear pain.  Jessicia was seen about 15 days ago for right throat pain or right-sided tonsil pain.  She was thought to have tonsillitis and treated with clindamycin.  This helped however pain returned yesterday.  She has pain in the right ear and into the right angle of the jaw.  This awoke her from sleep.  The pain has continued mildly.  She is tried some over-the-counter medications which help.  She denies any trouble breathing.  She feels mildly sick with some congestion and mild body aches but otherwise feels pretty well.   ROS as above:  Exam:  BP 137/84   Pulse 88   Temp 98 F (36.7 C) (Oral)   Ht 5\' 3"  (1.6 m)   Wt 184 lb (83.5 kg)   BMI 32.59 kg/m  Wt Readings from Last 5 Encounters:  07/12/18 184 lb (83.5 kg)  06/27/18 188 lb (85.3 kg)  05/04/18 190 lb (86.2 kg)  03/17/18 192 lb (87.1 kg)  03/01/18 191 lb (86.6 kg)    Gen: Well NAD HEENT: EOMI,  MMM normal external ear and ear canal.  Right tympanic membrane is retracted but nonerythematous.  Left is normal-appearing.  Nontender mastoids.  No significant palpable neck masses or lymphadenopathy.  Mildly tender to palpation right angle of the jaw.  Nontender TMJ area.  Right-sided shunt is palpable and nontender at the posterior to the sternocleidomastoid. Lungs: Normal work of breathing. CTABL Heart: RRR no MRG Abd: NABS, Soft. Nondistended, Nontender Exts: Brisk capillary refill, warm and well perfused.   Lab and Radiology Results No results found for this or any previous visit (from the past 72 hour(s)). No results found.    Assessment and Plan: 53 y.o. female with right-sided ear pain.  Unclear etiology.  Patient does have a retracted tympanic membrane and this could be viral URI or some other issue.  This could be  recurrent from her previous right-sided throat neck pain.  Plan for treatment with over-the-counter medications.  If not improving neck step would be prednisone.  If after that neck step would be clindamycin.  Prednisone and clindamycin printed.  Precautions reviewed.  Differential includes also some other etiologies including C2 or C3 cervical radicular pain or issues related to shunt.  However these are very unlikely.  PDMP not reviewed this encounter. No orders of the defined types were placed in this encounter.  Meds ordered this encounter  Medications  . predniSONE (DELTASONE) 10 MG tablet    Sig: Take 3 tablets (30 mg total) by mouth daily with breakfast.    Dispense:  15 tablet    Refill:  0  . clindamycin (CLEOCIN) 300 MG capsule    Sig: Take 1 capsule (300 mg total) by mouth 3 (three) times daily.    Dispense:  21 capsule    Refill:  0     Historical information moved to improve visibility of documentation.  Past Medical History:  Diagnosis Date  . Anemia   . Anxiety   . Arthritis    "back and knees" (07/23/2016)  . Chiari malformation type I ([place]) dx'd 2014  . Chronic back pain    buldging, DDD; "neck and lower back" (07/23/2016)  . Chronic constipation    takes Miralax daily  . Chronic pain syndrome    takes Methadone and Keppra daily  . Depression  takes Effexor daily  . Endometriosis    1995  . Fibromyalgia   . GERD (gastroesophageal reflux disease)    not taking anything at the present time  . History of DVT of lower extremity 07/2012   LEFT LEG --  3/ 2014.Was on a blood transfusion for 6 months  . History of kidney stones   . History of MRSA infection 10 yrs ago  . Hyperlipidemia    takes Simvastatin daily  . Migraine    "daily recently" (07/23/2016)  . Muscle spasm    takes Zanaflex nightly  . Peripheral edema    takes Lasix daily as needed  . Peripheral neuropathy   . Pneumonia    hx of-7-8 yrs ago  . PONV (postoperative nausea and vomiting)   .  Seasonal allergies    takes Claritin daily  . Spinal headache    "since 2002; still get them; try to manage it w/lying flat"  . Weakness    and numbness in right hand   Past Surgical History:  Procedure Laterality Date  . ANTERIOR CERVICAL DECOMP/DISCECTOMY FUSION  07-25-2009   C5 -- C6  . APPENDECTOMY  age 37  . APPLICATION OF CRANIAL NAVIGATION N/A 06/23/2017   Procedure: APPLICATION OF CRANIAL NAVIGATION;  Surgeon: Kary Kos, MD;  Location: [facility];  Service: Neurosurgery;  Laterality: N/A;  . BACK SURGERY    . BREAST REDUCTION SURGERY Bilateral 06/07/2013   Procedure: BILATERAL MAMMARY REDUCTION  (BREAST);  Surgeon: Theodoro Kos, DO;  Location: [facility];  Service: Plastics;  Laterality: Bilateral;  . BREAST REDUCTION SURGERY Bilateral 06/07/2013   Procedure:  LIPOSUCTION;  Surgeon: Theodoro Kos, DO;  Location: [facility];  Service: Plastics;  Laterality: Bilateral;  . CARPAL TUNNEL RELEASE Bilateral 2001  . COLONOSCOPY  X 2   "polyps were benign"  . DILATION AND CURETTAGE OF UTERUS    . ESOPHAGOGASTRODUODENOSCOPY    . gastric banding undone  11/2014  . LAPAROSCOPIC CHOLECYSTECTOMY  ~ 2000  . LAPAROSCOPIC GASTRIC BANDING  2011  . LAPAROSCOPIC REVISION VENTRICULAR-PERITONEAL (V-P) SHUNT N/A 06/23/2017   Procedure: Shunt Placment stereotactic ventricular catheter placement and general surgery abdominal exposure  (Laparoscopic vs. Open) Brain Lab;  Surgeon: Clovis Riley, MD;  Location: [facility];  Service: General;  Laterality: N/A;  . LAPAROSCOPY N/A 06/23/2017   Procedure: LAPAROSCOPY DIAGNOSTIC;  Surgeon: Clovis Riley, MD;  Location: [facility];  Service: General;  Laterality: N/A;  . LUMBAR LAMINECTOMY/DECOMPRESSION MICRODISCECTOMY N/A 07/23/2016   Procedure: Decompressive Lumbar Laminectomy Lumbar two-three  for Repair of  Cerberal Spinal Fluid  Leak;  Surgeon: Kary Kos, MD;  Location: [facility];  Service: Neurosurgery;  Laterality: N/A;  . PLACEMENT  LUMBOPERITONEAL SHUNT FOR CEREBROSPINAL FLUID DIVERSION AWAY FROM C8 PERINEURAL CYST  09-05-2003  &  12-24-2003   08-31-2003  REMOVAL SHUNT  . POSTERIOR CERVICAL LAMINECTOMY  03-01-2001   C7-8 and Exploration C8 perineural cyst  . POSTERIOR LUMBAR LAMINECTOMY L4-5/  DISKECTOMY L5-S1  05-20-2005  . RE-DO DECOMPRESSION LAMINECTOMY AND FUSION L4-5  &  L5-S1  12-20-2006   02-04-2007--  RE-EXPLORATION L4 to S1, I & D LUMBAR WOUND HEMATOMA  . REDUCTION MAMMAPLASTY    . SHUNT REMOVAL N/A 07/10/2016   Procedure: SHUNT REMOVAL;  Surgeon: Kary Kos, MD;  Location: [facility];  Service: Neurosurgery;  Laterality: N/A;  SHUNT REMOVAL  . SPINAL CORD STIMULATOR INSERTION N/A 04/26/2015   Procedure: LUMBAR SPINAL CORD STIMULATOR INSERTION;  Surgeon: Clydell Hakim, MD;  Location: [facility];  Service: Neurosurgery;  Laterality: N/A;  LUMBAR SPINAL CORD STIMULATOR INSERTION  . TENNIS ELBOW RELEASE/NIRSCHEL PROCEDURE Left 12/13/2014   Procedure: LEFT ELBOW LATERAL EPICONDYLAR DEBRIDEMENT AND OR REPAIR -RECONSTRUCTION ;  Surgeon: Iran Planas, MD;  Location: [facility];  Service: Orthopedics;  Laterality: Left;  . TOTAL ABDOMINAL HYSTERECTOMY  1994   w/  Bilateral Salpinoophorectomy  . VENTRICULOPERITONEAL SHUNT N/A 06/23/2017   Procedure: VENTRICULAR-PERITONEAL SHUNT INSERTION WITH ABDOMINAL LAPARASCOPY AND BRAINLAB NAVIGATION;  Surgeon: Kary Kos, MD;  Location: [facility];  Service: Neurosurgery;  Laterality: N/A;   Social History   Tobacco Use  . Smoking status: Never Smoker  . Smokeless tobacco: Never Used  Substance Use Topics  . Alcohol use: No   family history includes Colon cancer in her father; Depression in an other family member; Ovarian cancer in her maternal grandmother; Pancreatic cancer in her maternal grandfather.  Medications: Current Outpatient Medications  Medication Sig Dispense Refill  . DULoxetine (CYMBALTA) 60 MG capsule TAKE 2 CAPSULES BY MOUTH DAILY 60 capsule 2  .  furosemide (LASIX) 40 MG tablet TAKE 1 TABLET BY MOUTH TWICE A DAY 180 tablet 1  . levETIRAcetam (KEPPRA) 500 MG tablet Take 500 mg by mouth 2 (two) times daily.     . methadone (DOLOPHINE) 5 MG tablet Take 2.5 mg by mouth every morning.   0  . montelukast (SINGULAIR) 10 MG tablet TAKE 1 TABLET (10 MG TOTAL) BY MOUTH AT BEDTIME. 90 tablet 1  . pramipexole (MIRAPEX) 0.5 MG tablet TAKE 1 TABLET (0.5 MG TOTAL) BY MOUTH AT BEDTIME. 90 tablet 0  . simvastatin (ZOCOR) 20 MG tablet TAKE 1 TABLET BY MOUTH DAILY. 90 tablet 1  . tiZANidine (ZANAFLEX) 4 MG capsule Take 4-8 mg by mouth 2 (two) times daily. ONE TAB AM - PRN AND 2 TABS AT BEDTIME    . traMADol (ULTRAM) 50 MG tablet Take 50-100 mg by mouth at bedtime.     . clindamycin (CLEOCIN) 300 MG capsule Take 1 capsule (300 mg total) by mouth 3 (three) times daily. 21 capsule 0  . predniSONE (DELTASONE) 10 MG tablet Take 3 tablets (30 mg total) by mouth daily with breakfast. 15 tablet 0   No current facility-administered medications for this visit.    Allergies  Allergen Reactions  . Penicillins Anaphylaxis    Has patient had a PCN reaction causing immediate rash, facial/tongue/throat swelling, SOB or lightheadedness with hypotension: Yes Has patient had a PCN reaction causing severe rash involving mucus membranes or skin necrosis: No Has patient had a PCN reaction that required hospitalization No Has patient had a PCN reaction occurring within the last 10 years: No If all of the above answers are "NO", then may proceed with Cephalosporin use.   . Lactose Swelling and Rash    SWELLING REACTION UNSPECIFIED   . Chlorhexidine Other (See Comments)    Dermatitis - Allergic  . Tape Rash    Plastic tape, silicone tape     Discussed warning signs or symptoms. Please see discharge instructions. Patient expresses understanding.

## 2018-07-12 NOTE — Patient Instructions (Signed)
Thank you for coming in today. Continue over the counter medicines.  Take the prednisone if not better.  If worse fill and take the clindamycin.  We will keep an open mind.  If not better next step may be ultrasound or CT scan soft tissue neck.

## 2018-07-19 ENCOUNTER — Other Ambulatory Visit: Payer: Self-pay | Admitting: Family Medicine

## 2018-08-15 ENCOUNTER — Encounter: Payer: Self-pay | Admitting: Family Medicine

## 2018-08-21 ENCOUNTER — Other Ambulatory Visit: Payer: Self-pay | Admitting: Family Medicine

## 2018-08-31 DIAGNOSIS — M961 Postlaminectomy syndrome, not elsewhere classified: Secondary | ICD-10-CM | POA: Diagnosis not present

## 2018-09-13 ENCOUNTER — Other Ambulatory Visit: Payer: Self-pay | Admitting: Family Medicine

## 2018-09-27 ENCOUNTER — Encounter: Payer: Self-pay | Admitting: Family Medicine

## 2018-09-27 ENCOUNTER — Ambulatory Visit (INDEPENDENT_AMBULATORY_CARE_PROVIDER_SITE_OTHER): Payer: Medicare HMO | Admitting: Family Medicine

## 2018-09-27 VITALS — Temp 98.4°F | Ht 63.0 in | Wt 177.6 lb

## 2018-09-27 DIAGNOSIS — R69 Illness, unspecified: Secondary | ICD-10-CM | POA: Diagnosis not present

## 2018-09-27 DIAGNOSIS — F411 Generalized anxiety disorder: Secondary | ICD-10-CM

## 2018-09-27 MED ORDER — BUSPIRONE HCL 10 MG PO TABS: 10.0000 mg | ORAL_TABLET | Freq: Three times a day (TID) | ORAL | 1 refills | Status: DC

## 2018-09-27 NOTE — Progress Notes (Signed)
Virtual Visit  via Video Note  I connected with      Katrina Fernandez by a video enabled telemedicine application and verified that I am speaking with the correct person using two identifiers.   I discussed the limitations of evaluation and management by telemedicine and the availability of in person appointments. The patient expressed understanding and agreed to proceed.  History of Present Illness: Katrina Fernandez is a 53 y.o. female who would like to discuss anxiety the   Anxiety: Worsening over the last 2 months. Starting before isolation with COVID-19. Katrina Fernandez notes that she is stuck at home.  She also notes that she is having some financial stress as her husband lost his job. Anxiety is present during the day. Less at night.  She has had episodes of irritability in the past and ultimately got better after a bit of waiting.  She has had trials of BuSpar for irritability which did not help.  She notes her symptoms are different than they have been previously.  She takes Cymbalta 120 mg daily for depression as well as pain which does help.  Additionally she has had some counts in the past which has helped.  She like to restart counseling or have a new referral.  Observations/Objective: Temp 98.4 F (36.9 C) (Oral)   Ht 5\' 3"  (1.6 m)   Wt 177 lb 9.6 oz (80.6 kg)   BMI 31.46 kg/m  Wt Readings from Last 5 Encounters:  09/27/18 177 lb 9.6 oz (80.6 kg)  07/12/18 184 lb (83.5 kg)  06/27/18 188 lb (85.3 kg)  05/04/18 190 lb (86.2 kg)  03/17/18 192 lb (87.1 kg)   Exam: Appearance nontoxic no acute distress Normal Speech.  Psych alert and oriented normal speech thought process and affect.  No SI or HI expressed.  Depression screen [facility] 2/9 09/27/2018 01/13/2018 07/20/2017 09/03/2016 03/25/2016  Decreased Interest 1 1 0 0 0  Down, Depressed, Hopeless 3 2 0 1 1  PHQ - 2 Score 4 3 0 1 1  Altered sleeping 3 2 - - 3  Tired, decreased energy 0 2 - - 2  Change in appetite 0 0 - - 0   Feeling bad or failure about yourself  0 2 - - 0  Trouble concentrating 0 0 - - 0  Moving slowly or fidgety/restless 0 0 - - 0  Suicidal thoughts 0 0 - - 0  PHQ-9 Score 7 9 - - 6  Difficult doing work/chores Not difficult at all Somewhat difficult - - Somewhat difficult  Some recent data might be hidden   GAD 7 : Generalized Anxiety Score 09/27/2018 01/13/2018 03/25/2016 02/20/2016  Nervous, Anxious, on Edge 3 2 1 3   Control/stop worrying 3 1 0 3  Worry too much - different things 3 2 1 3   Trouble relaxing 2 0 1 2  Restless 0 0 0 2  Easily annoyed or irritable 2 3 1 1   Afraid - awful might happen 1 0 0 0  Total GAD 7 Score 14 8 4 14   Anxiety Difficulty Not difficult at all Very difficult Somewhat difficult Somewhat difficult     Lab and Radiology Results No results found for this or any previous visit (from the past 72 hour(s)). No results found.   Assessment and Plan: 53 y.o. female with anxiety worsening.  Increased stressors.  Plan to continue Cymbalta 120 mg daily.  Plan to refer to counseling/therapy.  Additionally will restart BuSpar.  Will titrate as  needed.  Recheck in 2 weeks via video visit.  PDMP reviewed during this encounter. Orders Placed This Encounter  Procedures  . Ambulatory referral to Behavioral Health    Referral Priority:   Routine    Referral Type:   Psychiatric    Referral Reason:   Specialty Services Required    Requested Specialty:   Behavioral Health    Number of Visits Requested:   1   Meds ordered this encounter  Medications  . busPIRone (BUSPAR) 10 MG tablet    Sig: Take 1 tablet (10 mg total) by mouth 3 (three) times daily.    Dispense:  90 tablet    Refill:  1    Follow Up Instructions:    I discussed the assessment and treatment plan with the patient. The patient was provided an opportunity to ask questions and all were answered. The patient agreed with the plan and demonstrated an understanding of the instructions.   The patient was  advised to call back or seek an in-person evaluation if the symptoms worsen or if the condition fails to improve as anticipated.  Time: 15 minutes of intraservice time, with >22 minutes of total time during today's visit.      Historical information moved to improve visibility of documentation.  Past Medical History:  Diagnosis Date  . Anemia   . Anxiety   . Arthritis    "back and knees" (07/23/2016)  . Chiari malformation type I ([place]) dx'd 2014  . Chronic back pain    buldging, DDD; "neck and lower back" (07/23/2016)  . Chronic constipation    takes Miralax daily  . Chronic pain syndrome    takes Methadone and Keppra daily  . Depression    takes Effexor daily  . Endometriosis    1995  . Fibromyalgia   . GERD (gastroesophageal reflux disease)    not taking anything at the present time  . History of DVT of lower extremity 07/2012   LEFT LEG --  3/ 2014.Was on a blood transfusion for 6 months  . History of kidney stones   . History of MRSA infection 10 yrs ago  . Hyperlipidemia    takes Simvastatin daily  . Migraine    "daily recently" (07/23/2016)  . Muscle spasm    takes Zanaflex nightly  . Peripheral edema    takes Lasix daily as needed  . Peripheral neuropathy   . Pneumonia    hx of-7-8 yrs ago  . PONV (postoperative nausea and vomiting)   . Seasonal allergies    takes Claritin daily  . Spinal headache    "since 2002; still get them; try to manage it w/lying flat"  . Weakness    and numbness in right hand   Past Surgical History:  Procedure Laterality Date  . ANTERIOR CERVICAL DECOMP/DISCECTOMY FUSION  07-25-2009   C5 -- C6  . APPENDECTOMY  age 19  . APPLICATION OF CRANIAL NAVIGATION N/A 06/23/2017   Procedure: APPLICATION OF CRANIAL NAVIGATION;  Surgeon: Kary Kos, MD;  Location: [place];  Service: Neurosurgery;  Laterality: N/A;  . BACK SURGERY    . BREAST REDUCTION SURGERY Bilateral 06/07/2013   Procedure: BILATERAL MAMMARY REDUCTION  (BREAST);  Surgeon: Theodoro Kos, DO;  Location: [place];  Service: Plastics;  Laterality: Bilateral;  . BREAST REDUCTION SURGERY Bilateral 06/07/2013   Procedure:  LIPOSUCTION;  Surgeon: Theodoro Kos, DO;  Location: [place];  Service: Plastics;  Laterality: Bilateral;  . CARPAL TUNNEL RELEASE  Bilateral 2001  . COLONOSCOPY  X 2   "polyps were benign"  . DILATION AND CURETTAGE OF UTERUS    . ESOPHAGOGASTRODUODENOSCOPY    . gastric banding undone  11/2014  . LAPAROSCOPIC CHOLECYSTECTOMY  ~ 2000  . LAPAROSCOPIC GASTRIC BANDING  2011  . LAPAROSCOPIC REVISION VENTRICULAR-PERITONEAL (V-P) SHUNT N/A 06/23/2017   Procedure: Shunt Placment stereotactic ventricular catheter placement and general surgery abdominal exposure  (Laparoscopic vs. Open) Brain Lab;  Surgeon: Clovis Riley, MD;  Location: [place];  Service: General;  Laterality: N/A;  . LAPAROSCOPY N/A 06/23/2017   Procedure: LAPAROSCOPY DIAGNOSTIC;  Surgeon: Clovis Riley, MD;  Location: [place];  Service: General;  Laterality: N/A;  . LUMBAR LAMINECTOMY/DECOMPRESSION MICRODISCECTOMY N/A 07/23/2016   Procedure: Decompressive Lumbar Laminectomy Lumbar two-three  for Repair of  Cerberal Spinal Fluid  Leak;  Surgeon: Kary Kos, MD;  Location: [place];  Service: Neurosurgery;  Laterality: N/A;  . PLACEMENT LUMBOPERITONEAL SHUNT FOR CEREBROSPINAL FLUID DIVERSION AWAY FROM C8 PERINEURAL CYST  09-05-2003  &  12-24-2003   08-31-2003  REMOVAL SHUNT  . POSTERIOR CERVICAL LAMINECTOMY  03-01-2001   C7-8 and Exploration C8 perineural cyst  . POSTERIOR LUMBAR LAMINECTOMY L4-5/  DISKECTOMY L5-S1  05-20-2005  . RE-DO DECOMPRESSION LAMINECTOMY AND FUSION L4-5  &  L5-S1  12-20-2006   02-04-2007--  RE-EXPLORATION L4 to S1, I & D LUMBAR WOUND HEMATOMA  . REDUCTION MAMMAPLASTY    . SHUNT REMOVAL N/A 07/10/2016   Procedure: SHUNT REMOVAL;  Surgeon: Kary Kos, MD;  Location: [place];  Service: Neurosurgery;  Laterality: N/A;  SHUNT REMOVAL  . SPINAL CORD  STIMULATOR INSERTION N/A 04/26/2015   Procedure: LUMBAR SPINAL CORD STIMULATOR INSERTION;  Surgeon: Clydell Hakim, MD;  Location: [place] [facility];  Service: Neurosurgery;  Laterality: N/A;  LUMBAR SPINAL CORD STIMULATOR INSERTION  . TENNIS ELBOW RELEASE/NIRSCHEL PROCEDURE Left 12/13/2014   Procedure: LEFT ELBOW LATERAL EPICONDYLAR DEBRIDEMENT AND OR REPAIR -RECONSTRUCTION ;  Surgeon: Iran Planas, MD;  Location: [place];  Service: Orthopedics;  Laterality: Left;  . TOTAL ABDOMINAL HYSTERECTOMY  1994   w/  Bilateral Salpinoophorectomy  . VENTRICULOPERITONEAL SHUNT N/A 06/23/2017   Procedure: VENTRICULAR-PERITONEAL SHUNT INSERTION WITH ABDOMINAL LAPARASCOPY AND BRAINLAB NAVIGATION;  Surgeon: Kary Kos, MD;  Location: [place];  Service: Neurosurgery;  Laterality: N/A;   Social History   Tobacco Use  . Smoking status: Never Smoker  . Smokeless tobacco: Never Used  Substance Use Topics  . Alcohol use: No   family history includes Colon cancer in her father; Depression in an other family member; Ovarian cancer in her maternal grandmother; Pancreatic cancer in her maternal grandfather.  Medications: Current Outpatient Medications  Medication Sig Dispense Refill  . DULoxetine (CYMBALTA) 60 MG capsule TAKE 2 CAPSULES BY MOUTH EVERY DAY 180 capsule 1  . furosemide (LASIX) 40 MG tablet TAKE 1 TABLET BY MOUTH TWICE A DAY 180 tablet 1  . methadone (DOLOPHINE) 5 MG tablet Take 2.5 mg by mouth every morning.   0  . montelukast (SINGULAIR) 10 MG tablet TAKE 1 TABLET (10 MG TOTAL) BY MOUTH AT BEDTIME. 90 tablet 1  . pramipexole (MIRAPEX) 0.5 MG tablet Take 1 tablet (0.5 mg total) by mouth at bedtime. MUST SCHEDULE AND KEEP APPOINTMENT FOR REFILLS 90 tablet 0  . simvastatin (ZOCOR) 20 MG tablet TAKE 1 TABLET BY MOUTH DAILY. 90 tablet 1  . tiZANidine (ZANAFLEX) 4 MG capsule Take 4-8 mg by mouth 2 (two) times daily. ONE TAB AM - PRN  AND 2 TABS AT BEDTIME    . traMADol (ULTRAM) 50 MG tablet Take  50-100 mg by mouth at bedtime.     . busPIRone (BUSPAR) 10 MG tablet Take 1 tablet (10 mg total) by mouth 3 (three) times daily. 90 tablet 1  . OZEMPIC, 1 MG/DOSE, 2 MG/1.5ML SOPN      No current facility-administered medications for this visit.    Allergies  Allergen Reactions  . Penicillins Anaphylaxis    Has patient had a PCN reaction causing immediate rash, facial/tongue/throat swelling, SOB or lightheadedness with hypotension: Yes Has patient had a PCN reaction causing severe rash involving mucus membranes or skin necrosis: No Has patient had a PCN reaction that required hospitalization No Has patient had a PCN reaction occurring within the last 10 years: No If all of the above answers are "NO", then may proceed with Cephalosporin use.   . Lactose Swelling and Rash    SWELLING REACTION UNSPECIFIED   . Chlorhexidine Other (See Comments)    Dermatitis - Allergic  . Tape Rash    Plastic tape, silicone tape

## 2018-09-27 NOTE — Patient Instructions (Addendum)
Thank you for coming in today. Lets try buspar.  Take up to 3x daily.  Keep me updated.  You should also hear from therapy soon.  Lets recheck in 2-3 weeks.   Buspirone tablets What is this medicine? BUSPIRONE (byoo SPYE rone) is used to treat anxiety disorders. This medicine may be used for other purposes; ask your health care provider or pharmacist if you have questions. COMMON BRAND NAME(S): BuSpar What should I tell my health care provider before I take this medicine? They need to know if you have any of these conditions: -kidney or liver disease -an unusual or allergic reaction to buspirone, other medicines, foods, dyes, or preservatives -pregnant or trying to get pregnant -breast-feeding How should I use this medicine? Take this medicine by mouth with a glass of water. Follow the directions on the prescription label. You may take this medicine with or without food. To ensure that this medicine always works the same way for you, you should take it either always with or always without food. Take your doses at regular intervals. Do not take your medicine more often than directed. Do not stop taking except on the advice of your doctor or health care professional. Talk to your pediatrician regarding the use of this medicine in children. Special care may be needed. Overdosage: If you think you have taken too much of this medicine contact a poison control center or emergency room at once. NOTE: This medicine is only for you. Do not share this medicine with others. What if I miss a dose? If you miss a dose, take it as soon as you can. If it is almost time for your next dose, take only that dose. Do not take double or extra doses. What may interact with this medicine? Do not take this medicine with any of the following medications: -linezolid -MAOIs like Carbex, Eldepryl, Marplan, Nardil, and Parnate -methylene blue -procarbazine This medicine may also interact with the following  medications: -diazepam -digoxin -diltiazem -erythromycin -grapefruit juice -haloperidol -medicines for mental depression or mood problems -medicines for seizures like carbamazepine, phenobarbital and phenytoin -nefazodone -other medications for anxiety -rifampin -ritonavir -some antifungal medicines like itraconazole, ketoconazole, and voriconazole -verapamil -warfarin This list may not describe all possible interactions. Give your health care provider a list of all the medicines, herbs, non-prescription drugs, or dietary supplements you use. Also tell them if you smoke, drink alcohol, or use illegal drugs. Some items may interact with your medicine. What should I watch for while using this medicine? Visit your doctor or health care professional for regular checks on your progress. It may take 1 to 2 weeks before your anxiety gets better. You may get drowsy or dizzy. Do not drive, use machinery, or do anything that needs mental alertness until you know how this drug affects you. Do not stand or sit up quickly, especially if you are an older patient. This reduces the risk of dizzy or fainting spells. Alcohol can make you more drowsy and dizzy. Avoid alcoholic drinks. What side effects may I notice from receiving this medicine? Side effects that you should report to your doctor or health care professional as soon as possible: -blurred vision or other vision changes -chest pain -confusion -difficulty breathing -feelings of hostility or anger -muscle aches and pains -numbness or tingling in hands or feet -ringing in the ears -skin rash and itching -vomiting -weakness Side effects that usually do not require medical attention (report to your doctor or health care professional if they continue  or are bothersome): -disturbed dreams, nightmares -headache -nausea -restlessness or nervousness -sore throat and nasal congestion -stomach upset This list may not describe all possible side  effects. Call your doctor for medical advice about side effects. You may report side effects to FDA at 1-800-FDA-1088. Where should I keep my medicine? Keep out of the reach of children. Store at room temperature below 30 degrees C (86 degrees F). Protect from light. Keep container tightly closed. Throw away any unused medicine after the expiration date. NOTE: This sheet is a summary. It may not cover all possible information. If you have questions about this medicine, talk to your doctor, pharmacist, or health care provider.  2019 [employer]vier/Gold Standard (2009-12-12 18:06:11)

## 2018-09-29 ENCOUNTER — Encounter: Payer: Self-pay | Admitting: Family Medicine

## 2018-10-11 ENCOUNTER — Telehealth (INDEPENDENT_AMBULATORY_CARE_PROVIDER_SITE_OTHER): Payer: Medicare HMO | Admitting: Family Medicine

## 2018-10-11 ENCOUNTER — Encounter: Payer: Self-pay | Admitting: Family Medicine

## 2018-10-11 VITALS — Temp 98.4°F | Ht 63.0 in | Wt 179.0 lb

## 2018-10-11 DIAGNOSIS — R69 Illness, unspecified: Secondary | ICD-10-CM | POA: Diagnosis not present

## 2018-10-11 DIAGNOSIS — F411 Generalized anxiety disorder: Secondary | ICD-10-CM | POA: Diagnosis not present

## 2018-10-11 DIAGNOSIS — F3341 Major depressive disorder, recurrent, in partial remission: Secondary | ICD-10-CM

## 2018-10-11 NOTE — Progress Notes (Signed)
Virtual Visit  via Video Note  I connected with      Katrina Fernandez by a video enabled telemedicine application and verified that I am speaking with the correct person using two identifiers.   I discussed the limitations of evaluation and management by telemedicine and the availability of in person appointments. The patient expressed understanding and agreed to proceed.  History of Present Illness: Katrina Fernandez is a 53 y.o. female who would like to discuss follow-up anxiety.  Patient was last seen on May 12.  At that time her anxiety had been worsening due to increased stressors due to COVID-19.  Patient was on maximal Cymbalta at 120 mg daily.  We added BuSpar and referred to counseling.  Patient notes that with BuSpar she started having nightmares and dizziness.  Recommended decreasing the dose and checking back today.  In the interim she notes that things are a bit better. Had not gotten set up for therapy yet.  She is taking BuSpar 10 mg size pills 1-2 times per day.  She notes fewer side effects now.  She is happy with how things are going.  She is planning on getting set up with counseling soon.   Observations/Objective: Temp 98.4 F (36.9 C) (Oral)   Ht 5\' 3"  (1.6 m)   Wt 179 lb (81.2 kg)   BMI 31.71 kg/m  Wt Readings from Last 5 Encounters:  10/11/18 179 lb (81.2 kg)  09/27/18 177 lb 9.6 oz (80.6 kg)  07/12/18 184 lb (83.5 kg)  06/27/18 188 lb (85.3 kg)  05/04/18 190 lb (86.2 kg)   Exam: Appearance nontoxic no acute distress Normal Speech.  Psych: Alert and oriented normal speech thought process and affect.  No SI or HI expressed.  Depression screen [facility] 2/9 10/11/2018 09/27/2018 01/13/2018 07/20/2017 09/03/2016  Decreased Interest 0 1 1 0 0  Down, Depressed, Hopeless 2 3 2  0 1  PHQ - 2 Score 2 4 3  0 1  Altered sleeping 3 3 2  - -  Tired, decreased energy 0 0 2 - -  Change in appetite 0 0 0 - -  Feeling bad or failure about yourself  1 0 2 - -  Trouble  concentrating 0 0 0 - -  Moving slowly or fidgety/restless 0 0 0 - -  Suicidal thoughts 0 0 0 - -  PHQ-9 Score 6 7 9  - -  Difficult doing work/chores Not difficult at all Not difficult at all Somewhat difficult - -  Some recent data might be hidden   GAD 7 : Generalized Anxiety Score 10/11/2018 09/27/2018 01/13/2018 03/25/2016  Nervous, Anxious, on Edge 3 3 2 1   Control/stop worrying 2 3 1  0  Worry too much - different things 1 3 2 1   Trouble relaxing 2 2 0 1  Restless 0 0 0 0  Easily annoyed or irritable 0 2 3 1   Afraid - awful might happen 0 1 0 0  Total GAD 7 Score 8 14 8 4   Anxiety Difficulty Not difficult at all Not difficult at all Very difficult Somewhat difficult      Lab and Radiology Results No results found for this or any previous visit (from the past 72 hour(s)). No results found.   Assessment and Plan: 53 y.o. female with anxiety: Improved.  I think counseling still would be helpful.  Plan for recheck in near future if needed.  Proceed with counseling continue BuSpar recheck as needed.  PDMP not reviewed this  encounter. No orders of the defined types were placed in this encounter.  No orders of the defined types were placed in this encounter.   Follow Up Instructions:    I discussed the assessment and treatment plan with the patient. The patient was provided an opportunity to ask questions and all were answered. The patient agreed with the plan and demonstrated an understanding of the instructions.   The patient was advised to call back or seek an in-person evaluation if the symptoms worsen or if the condition fails to improve as anticipated.  Time: 15 minutes of intraservice time, with >22 minutes of total time during today's visit.      Historical information moved to improve visibility of documentation.  Past Medical History:  Diagnosis Date  . Anemia   . Anxiety   . Arthritis    "back and knees" (07/23/2016)  . Chiari malformation type I ([place]) dx'd  2014  . Chronic back pain    buldging, DDD; "neck and lower back" (07/23/2016)  . Chronic constipation    takes Miralax daily  . Chronic pain syndrome    takes Methadone and Keppra daily  . Depression    takes Effexor daily  . Endometriosis    1995  . Fibromyalgia   . GERD (gastroesophageal reflux disease)    not taking anything at the present time  . History of DVT of lower extremity 07/2012   LEFT LEG --  3/ 2014.Was on a blood transfusion for 6 months  . History of kidney stones   . History of MRSA infection 10 yrs ago  . Hyperlipidemia    takes Simvastatin daily  . Migraine    "daily recently" (07/23/2016)  . Muscle spasm    takes Zanaflex nightly  . Peripheral edema    takes Lasix daily as needed  . Peripheral neuropathy   . Pneumonia    hx of-7-8 yrs ago  . PONV (postoperative nausea and vomiting)   . Seasonal allergies    takes Claritin daily  . Spinal headache    "since 2002; still get them; try to manage it w/lying flat"  . Weakness    and numbness in right hand   Past Surgical History:  Procedure Laterality Date  . ANTERIOR CERVICAL DECOMP/DISCECTOMY FUSION  07-25-2009   C5 -- C6  . APPENDECTOMY  age 72  . APPLICATION OF CRANIAL NAVIGATION N/A 06/23/2017   Procedure: APPLICATION OF CRANIAL NAVIGATION;  Surgeon: Kary Kos, MD;  Location: [employer];  Service: Neurosurgery;  Laterality: N/A;  . BACK SURGERY    . BREAST REDUCTION SURGERY Bilateral 06/07/2013   Procedure: BILATERAL MAMMARY REDUCTION  (BREAST);  Surgeon: Theodoro Kos, DO;  Location: [place];  Service: Plastics;  Laterality: Bilateral;  . BREAST REDUCTION SURGERY Bilateral 06/07/2013   Procedure:  LIPOSUCTION;  Surgeon: Theodoro Kos, DO;  Location: [place];  Service: Plastics;  Laterality: Bilateral;  . CARPAL TUNNEL RELEASE Bilateral 2001  . COLONOSCOPY  X 2   "polyps were benign"  . DILATION AND CURETTAGE OF UTERUS    . ESOPHAGOGASTRODUODENOSCOPY    . gastric  banding undone  11/2014  . LAPAROSCOPIC CHOLECYSTECTOMY  ~ 2000  . LAPAROSCOPIC GASTRIC BANDING  2011  . LAPAROSCOPIC REVISION VENTRICULAR-PERITONEAL (V-P) SHUNT N/A 06/23/2017   Procedure: Shunt Placment stereotactic ventricular catheter placement and general surgery abdominal exposure  (Laparoscopic vs. Open) Brain Lab;  Surgeon: Clovis Riley, MD;  Location: [place];  Service: General;  Laterality: N/A;  .  LAPAROSCOPY N/A 06/23/2017   Procedure: LAPAROSCOPY DIAGNOSTIC;  Surgeon: Clovis Riley, MD;  Location: [place];  Service: General;  Laterality: N/A;  . LUMBAR LAMINECTOMY/DECOMPRESSION MICRODISCECTOMY N/A 07/23/2016   Procedure: Decompressive Lumbar Laminectomy Lumbar two-three  for Repair of  Cerberal Spinal Fluid  Leak;  Surgeon: Kary Kos, MD;  Location: [place];  Service: Neurosurgery;  Laterality: N/A;  . PLACEMENT LUMBOPERITONEAL SHUNT FOR CEREBROSPINAL FLUID DIVERSION AWAY FROM C8 PERINEURAL CYST  09-05-2003  &  12-24-2003   08-31-2003  REMOVAL SHUNT  . POSTERIOR CERVICAL LAMINECTOMY  03-01-2001   C7-8 and Exploration C8 perineural cyst  . POSTERIOR LUMBAR LAMINECTOMY L4-5/  DISKECTOMY L5-S1  05-20-2005  . RE-DO DECOMPRESSION LAMINECTOMY AND FUSION L4-5  &  L5-S1  12-20-2006   02-04-2007--  RE-EXPLORATION L4 to S1, I & D LUMBAR WOUND HEMATOMA  . REDUCTION MAMMAPLASTY    . SHUNT REMOVAL N/A 07/10/2016   Procedure: SHUNT REMOVAL;  Surgeon: Kary Kos, MD;  Location: [place];  Service: Neurosurgery;  Laterality: N/A;  SHUNT REMOVAL  . SPINAL CORD STIMULATOR INSERTION N/A 04/26/2015   Procedure: LUMBAR SPINAL CORD STIMULATOR INSERTION;  Surgeon: Clydell Hakim, MD;  Location: [place] [facility];  Service: Neurosurgery;  Laterality: N/A;  LUMBAR SPINAL CORD STIMULATOR INSERTION  . TENNIS ELBOW RELEASE/NIRSCHEL PROCEDURE Left 12/13/2014   Procedure: LEFT ELBOW LATERAL EPICONDYLAR DEBRIDEMENT AND OR REPAIR -RECONSTRUCTION ;  Surgeon: Iran Planas, MD;  Location: [place];  Service:  Orthopedics;  Laterality: Left;  . TOTAL ABDOMINAL HYSTERECTOMY  1994   w/  Bilateral Salpinoophorectomy  . VENTRICULOPERITONEAL SHUNT N/A 06/23/2017   Procedure: VENTRICULAR-PERITONEAL SHUNT INSERTION WITH ABDOMINAL LAPARASCOPY AND BRAINLAB NAVIGATION;  Surgeon: Kary Kos, MD;  Location: [place];  Service: Neurosurgery;  Laterality: N/A;   Social History   Tobacco Use  . Smoking status: Never Smoker  . Smokeless tobacco: Never Used  Substance Use Topics  . Alcohol use: No   family history includes Colon cancer in her father; Depression in an other family member; Ovarian cancer in her maternal grandmother; Pancreatic cancer in her maternal grandfather.  Medications: Current Outpatient Medications  Medication Sig Dispense Refill  . busPIRone (BUSPAR) 10 MG tablet Take 1 tablet (10 mg total) by mouth 3 (three) times daily. 90 tablet 1  . DULoxetine (CYMBALTA) 60 MG capsule TAKE 2 CAPSULES BY MOUTH EVERY DAY 180 capsule 1  . furosemide (LASIX) 40 MG tablet TAKE 1 TABLET BY MOUTH TWICE A DAY 180 tablet 1  . methadone (DOLOPHINE) 5 MG tablet Take 2.5 mg by mouth every morning.   0  . montelukast (SINGULAIR) 10 MG tablet TAKE 1 TABLET (10 MG TOTAL) BY MOUTH AT BEDTIME. 90 tablet 1  . OZEMPIC, 1 MG/DOSE, 2 MG/1.5ML SOPN     . pramipexole (MIRAPEX) 0.5 MG tablet Take 1 tablet (0.5 mg total) by mouth at bedtime. MUST SCHEDULE AND KEEP APPOINTMENT FOR REFILLS 90 tablet 0  . simvastatin (ZOCOR) 20 MG tablet TAKE 1 TABLET BY MOUTH DAILY. 90 tablet 1  . tiZANidine (ZANAFLEX) 4 MG capsule Take 4-8 mg by mouth 2 (two) times daily. ONE TAB AM - PRN AND 2 TABS AT BEDTIME    . traMADol (ULTRAM) 50 MG tablet Take 50-100 mg by mouth at bedtime.      No current facility-administered medications for this visit.    Allergies  Allergen Reactions  . Penicillins Anaphylaxis    Has patient had a PCN reaction causing immediate rash, facial/tongue/throat swelling, SOB or lightheadedness with  hypotension: Yes Has  patient had a PCN reaction causing severe rash involving mucus membranes or skin necrosis: No Has patient had a PCN reaction that required hospitalization No Has patient had a PCN reaction occurring within the last 10 years: No If all of the above answers are "NO", then may proceed with Cephalosporin use.   . Lactose Swelling and Rash    SWELLING REACTION UNSPECIFIED   . Chlorhexidine Other (See Comments)    Dermatitis - Allergic  . Tape Rash    Plastic tape, silicone tape

## 2018-10-19 ENCOUNTER — Other Ambulatory Visit: Payer: Self-pay | Admitting: Family Medicine

## 2018-10-22 ENCOUNTER — Other Ambulatory Visit: Payer: Self-pay | Admitting: Family Medicine

## 2018-11-01 ENCOUNTER — Ambulatory Visit (INDEPENDENT_AMBULATORY_CARE_PROVIDER_SITE_OTHER): Payer: Medicare HMO | Admitting: Psychology

## 2018-11-01 DIAGNOSIS — R69 Illness, unspecified: Secondary | ICD-10-CM | POA: Diagnosis not present

## 2018-11-01 DIAGNOSIS — F411 Generalized anxiety disorder: Secondary | ICD-10-CM | POA: Diagnosis not present

## 2018-11-01 DIAGNOSIS — F4323 Adjustment disorder with mixed anxiety and depressed mood: Secondary | ICD-10-CM

## 2018-11-03 ENCOUNTER — Ambulatory Visit: Payer: Medicare HMO | Admitting: Psychology

## 2018-11-08 ENCOUNTER — Other Ambulatory Visit: Payer: Self-pay | Admitting: *Deleted

## 2018-11-08 NOTE — Patient Outreach (Signed)
[facility]) Care Management  11/08/2018  Katrina Fernandez 02/21/1966 [id number]   Telephone Screen  Referral Date: 11/08/18 Referral Source: suzanne cockrum -Dr Georgina Snell  Referral Reason: spoke with pt on 11/07/18 Pt states that she cannot afford her Ozempic, inj 2/1/5 ml Pt  She could only afford a certain day supply and has been using as she has been preserving her supply. Pt has ~ 2 weeks remaining of the medication . Pt's spouse lost his job d/t COVID -19 therefore the $200.00 expense that is associated with this medication is not affordable for her.  Insurance: [employer]    Outreach attempt # 1 No answer. [facility] RN CM left HIPAA compliant voicemail message along with CM's contact info.   Plan: [facility] RN CM sent an unsuccessful outreach letter and scheduled this patient for another call attempt within 4 business days  Aidyn Sportsman L. Lavina Hamman, RN, BSN, [place] Coordinator Office number [phone] Mobile number [phone]  Main [facility] number [phone] Fax number [phone]

## 2018-11-09 ENCOUNTER — Encounter: Payer: Self-pay | Admitting: *Deleted

## 2018-11-09 ENCOUNTER — Other Ambulatory Visit: Payer: Self-pay

## 2018-11-09 ENCOUNTER — Other Ambulatory Visit: Payer: Self-pay | Admitting: *Deleted

## 2018-11-09 ENCOUNTER — Telehealth: Payer: Self-pay | Admitting: Pharmacist

## 2018-11-09 NOTE — Telephone Encounter (Signed)
-----   Message from Jiles Harold sent at 11/09/2018  1:42 PM EDT ----- Regarding: Referral: Order for Etheleen Mayhew  Referral from Jackelyn Poling, RN  "Please see below request"  Forde Radon ----- Message ----- From: Barbaraann Faster, RN Sent: 11/09/2018   1:36 PM EDT To: [facility] Cm Communication Orders Subject: Order for [facility]                    Patient Name: Katrina Fernandez, Katrina Fernandez Q([id number]) Sex: Female DOB: November 21, 1965    PCP: [facility], [place]: [facility]   Types of orders made on 11/09/2018: Nursing  Order Date:11/09/2018 Ordering User:GIBBS, Mickel Crow [[id number]] Encounter Provider:Gibbs, Mickel Crow, RN [[id number]] Lidia Collum zing Provider: Gregor Hams, MD [phone] Department:[facility]COMMUNITY[[id number]]  Order Specific Information Order: Comm to Pharmacy [Custom: [id number]]  Order #: [id number] Qty: 1   Priority: STAT  Class: Clinic Performed   Comment:Referral from: Telephonic [facility] RN CM             Medication assistance. Patient unable to afford meds - Ozempic and             cymbalta (annually reports she goes in do nut hole related to each of             these medicines)            Entered in STAT as she reports only having one week of medication             left     Reason for Consult -> Medication Assistance       Priority: STAT  Class: Clinic Performed   Comment:Referral from: Telephonic [facility] RN CM             Medication assistance. Patient unable to afford meds - Ozempic and             cymbalta (annually reports she go es in donut hole related to each of             these medicines)            Entered in STAT as she reports only having one week of medication             left     Reason for Consult -> Medication Assistance

## 2018-11-09 NOTE — Patient Outreach (Signed)
[place] [facility]) Care Management  11/09/2018  Katrina Fernandez May 14, 1966 [id number]   Telephone Screen  Referral Date: 11/08/18 Referral Source: suzanne cockrum -Dr Georgina Snell  Referral Reason: spoke with pt on 11/07/18 Pt states that she cannot afford her Ozempic, inj 2/1/5 ml Pt  She could only afford a certain amount of day supply and has been using the minimal as she has been preserving her supply. Pt has ~ 2 weeks remaining of the medication . Pt's spouse lost his job d/t COVID -19 therefore the $200.00 expense that is associated with this medication is not affordable for her.   Insurance: Government social research officer    Outreach attempt # 2 successful to her home number Patient is able to verify HIPAA Reviewed and addressed the purpose of the referral call with the patient  Consent: [facility] RN CM reviewed [facility] services with patient. Patient gave verbal consent for services.   She confirms she is having medication cost concerns with Ozempic    Social Katrina Fernandez is married, on permanent disability and lives at home with her husband, her support system. She is independent in her care needs. She denies concern with transportation to medical appointments She is on a fixed income She reports she has recently spoken to Dr Georgina Snell and will be restarting counseling during this time of the covid pandemic and the loss of her husband's job At this tie she denies need for [facility] SW referral to assist but will call prn  She is on Buspar and Cymbalta  Conditions: MDD, generalized anxiety, seasonal allergies, fibromyalgia, HLD, obesity, chronic pain syndrome, irritability, hx of laparoscopic adjustable gastric banding, spinal cord stimulator, insomnia, plantar fasciitis of left foot, left hip trochanteric bursitis, osteoarthritis of carpometacarpal joint of left thumb   Medications  Katrina Fernandez confirms she goes into the donut hole annually related to Ozempic and Cymbalta. She reports the last charge  for Ozempic was $209-210  She now as of 11/09/18  has only one  week worth of Ozempic left  She reports she keeps her medicines in a locked box  She reports only having 10 prescribed medicines She denies use of SSI program for medication assistance  She reports side effect from various medications but denies s/s she is not familiar with   Appointments Seen by Dr Georgina Snell on 10/11/18  by a video enabled telemedicine  [place] for pain is her pain management providers and manages all her pain medicines Advance directives - She reports not having advance directives and states she may look into getting later She was encouraged to call back to [facility] if assistance is needed   Plan: [facility] RN CM will refer Katrina Fernandez to [place] for medication assistance for Ozempic  and Cymbalta  [facility] RN CM will close case at this time   Pt encouraged to return a call to [place] CM prn  [facility] RN CM discussed the sent outreach letter with [facility] brochure enclosed for review  Routed to MDs  Katrina Millin L. Lavina Hamman, RN, BSN, [place] Coordinator Office number [phone] Mobile number [phone]  Main [facility] number [phone] Fax number [phone]

## 2018-11-09 NOTE — Patient Outreach (Signed)
[facility]) Care Management  [place]   11/09/2018  LY WASS 1965-09-19 [id number]  Reason for referral: Medication Assistance  Referral source: [facility] Telephonic Nurse Referral medication(s):Ozempic Current insurance:[employer]  HPI:  Chronic back pain, fibromyalgia, GAD, GERD, hyperlipidemia, major depressive disorder, obesity, osteoarthritis, prediabetes, RLS, seasonal allergies, and trochanteric bursitis of the left hip.  Objective: Allergies  Allergen Reactions  . Penicillins Anaphylaxis    Has patient had a PCN reaction causing immediate rash, facial/tongue/throat swelling, SOB or lightheadedness with hypotension: Yes Has patient had a PCN reaction causing severe rash involving mucus membranes or skin necrosis: No Has patient had a PCN reaction that required hospitalization No Has patient had a PCN reaction occurring within the last 10 years: No If all of the above answers are "NO", then may proceed with Cephalosporin use.   . Lactose Nausea And Vomiting and Swelling       . Chlorhexidine Rash and Other (See Comments)    Dermatitis - Allergic  . Tape Rash    Plastic tape, silicone tape    Medications Reviewed Today    Reviewed by Elayne Guerin, [facility] (Pharmacist) on 11/09/18 at 1503  Med List Status: <None>  Medication Order Taking? Sig Documenting Provider Last Dose Status Informant  busPIRone (BUSPAR) 10 MG tablet [id number] Yes TAKE 1 TABLET BY MOUTH THREE TIMES A DAY Gregor Hams, MD Taking Active   DULoxetine (CYMBALTA) 60 MG capsule [id number] Yes TAKE 2 CAPSULES BY MOUTH EVERY DAY Gregor Hams, MD Taking Active   furosemide (LASIX) 40 MG tablet [id number] Yes TAKE 1 TABLET BY MOUTH TWICE A DAY Gregor Hams, MD Taking Active   methadone (DOLOPHINE) 5 MG tablet [id number] Yes Take 2.5 mg by mouth every morning.  [provider] Taking Active Self           Med Note Spero Curb, GREG A   Thu Jul 09, 2016  7:35 PM)    montelukast  (SINGULAIR) 10 MG tablet [id number] Yes TAKE 1 TABLET (10 MG TOTAL) BY MOUTH AT BEDTIME. Gregor Hams, MD Taking Active   [facility], 1 MG/DOSE, 2 MG/1.5ML [place] [facility] [id number] Yes  [provider] Taking Active   pramipexole (MIRAPEX) 0.5 MG tablet [id number] Yes Take 1 tablet (0.5 mg total) by mouth at bedtime. MUST SCHEDULE AND KEEP APPOINTMENT FOR REFILLS Gregor Hams, MD Taking Active   simvastatin (ZOCOR) 20 MG tablet [id number] Yes TAKE 1 TABLET BY MOUTH DAILY. Gregor Hams, MD Taking Active   tiZANidine (ZANAFLEX) 4 MG capsule [id number] Yes Take 4-8 mg by mouth 2 (two) times daily. ONE TAB AM - PRN AND 2 TABS AT BEDTIME [provider] Taking Active Self  traMADol (ULTRAM) 50 MG tablet [id number] Yes Take 50-100 mg by mouth at bedtime.  [provider] Taking Active Self          Assessment:  Drugs sorted by system:  Neurologic/Psychologic: Buspirone, Duloxetine, Pramipexole  Cardiovascular: Furosemide, Simvastatin,   Pulmonary/Allergy: Montelukast  Endocrine: Ozempic  Pain: Methadone, Tramadol, Tizanidine  Medication Assistance Findings:  Medication assistance needs identified: Ozempic  Applied patient for Extra Help Patient may qualify to receive Ozempic from [place] Patient Assistance Program.  Patient was encouraged to reach out to her provider about samples since the patient assistance process takes > 4 weeks.   Additional medication assistance options reviewed with patient as warranted:  No other options identified  Plan: I will route patient assistance letter to [facility] pharmacy technician who  will coordinate patient assistance program application process for medications listed above.  [facility] pharmacy technician will assist with obtaining all required documents from both patient and provider(s) and submit application(s) once completed.    Will follow up with the patient in 4-6 weeks.  Elayne Guerin, PharmD, [place] Clinical  Pharmacist [phone]

## 2018-11-10 ENCOUNTER — Other Ambulatory Visit: Payer: Self-pay | Admitting: Pharmacy Technician

## 2018-11-10 NOTE — Patient Outreach (Signed)
[facility]) Care Management  11/10/2018  LANECIA SLIVA [birth year] [id number]                           Medication Assistance Referral  Referral From: [place]  Medication/Company: [employer] / [employer] Patient application portion:  Education officer, museum portion: Faxed  to Dr. Lynne Leader    Follow up:  Will follow up with patient in 5-10 business days to confirm application(s) have been received.  Annaliza Zia P. Elise Knobloch, [place] Management [phone]

## 2018-11-11 ENCOUNTER — Telehealth: Payer: Self-pay | Admitting: Family Medicine

## 2018-11-11 NOTE — Telephone Encounter (Signed)
Received patient assistance information from [place] (through [facility]) for [place].  Will fax back completed information.

## 2018-11-15 ENCOUNTER — Other Ambulatory Visit: Payer: Self-pay

## 2018-11-15 ENCOUNTER — Ambulatory Visit (INDEPENDENT_AMBULATORY_CARE_PROVIDER_SITE_OTHER): Payer: Medicare HMO

## 2018-11-15 ENCOUNTER — Encounter: Payer: Self-pay | Admitting: Family Medicine

## 2018-11-15 ENCOUNTER — Ambulatory Visit (INDEPENDENT_AMBULATORY_CARE_PROVIDER_SITE_OTHER): Payer: Medicare HMO | Admitting: Family Medicine

## 2018-11-15 VITALS — BP 139/85 | HR 111 | Ht 63.0 in | Wt 181.0 lb

## 2018-11-15 DIAGNOSIS — S8991XA Unspecified injury of right lower leg, initial encounter: Secondary | ICD-10-CM | POA: Diagnosis not present

## 2018-11-15 DIAGNOSIS — M25562 Pain in left knee: Secondary | ICD-10-CM

## 2018-11-15 DIAGNOSIS — M25462 Effusion, left knee: Secondary | ICD-10-CM | POA: Diagnosis not present

## 2018-11-15 DIAGNOSIS — M170 Bilateral primary osteoarthritis of knee: Secondary | ICD-10-CM | POA: Diagnosis not present

## 2018-11-15 MED ORDER — DICLOFENAC SODIUM 1 % TD GEL: 4.0000 g | Freq: Four times a day (QID) | TRANSDERMAL | 11 refills | Status: DC

## 2018-11-15 NOTE — Patient Instructions (Addendum)
Thank you for coming in today. Use a hinged knee brace.  Use a walker if needed.  Apply ice and compression.  Recheck in 2 weeks.  Return sooner if needed.    Anterior Cruciate Ligament Tear  Ligaments are strong bands of tissue that connect bones to each other. The anterior cruciate ligament (ACL) connects your shin bone to your thigh bone. A tear in this ligament can cause pain and make it hard for you to use your leg to support your body weight. There are three types of ACL injuries:  Grade 1. In this type, the ligament is stretched.  Grade 2. In this type, the ligament is partially torn.  Grade 3. In this type, the ligament is completely torn. What are the causes? This condition happens when too much pressure is put on the ACL. This condition may be caused by:  Twisting your knee, especially with your foot planted.  Making a quick change in direction (cut and pivot).  Slowing down quickly while running.  Landing a jump without bending your knee.  Forcefully straightening (hyperextending)your knee more than normal.  Getting hit in the knee.  Hitting your knee on the ground. What increases the risk? You are more likely to develop this condition if:  You are a woman.  You play sports that involve jumping or changing directions often. These include: ? Football. ? Basketball. ? Soccer. ? Volleyball. ? Skiing. ? Hockey. ? Gymnastics. What are the signs or symptoms? Symptoms of this condition include:  Feeling or hearing a popping at the time of injury.  A feeling that your knee is giving way at the time of injury.  Pain.  Swelling.  Tenderness.  Instability when you try to put weight on your injured leg.  Inability to move your knee as far as normal.  Difficulty walking. How is this diagnosed? This condition may be diagnosed based on:  Your symptoms.  Your medical history.  A physical exam. During your physical exam, your provider will test your  knee to see if it moves more than it should (has laxity).  Imaging tests, such as: ? An X-ray. This may be done to check for bone injuries. ? MRI. This may be done to see if the tear is partial or complete and to check for additional injuries. How is this treated? This condition may be treated by:  Resting your knee.  Avoiding activities that cause pain, instability, or swelling.  Raising (elevating) your knee while resting.  Keeping weight off your leg until pain and swelling improve. You may need to use crutches or a walker.  Icing your knee.  Taking medicine to reduce pain and swelling.  Wearing a knee brace.  Wearing a compression bandage or wrap to reduce swelling.  Doing range-of-motion, strengthening, and stretching exercises (physical therapy).  Having surgery. This may be needed if you are very active and have a complete tear. Follow these instructions at home: If you have a brace:  Wear the brace as told by your health care provider. Remove it only as told by your health care provider.  Loosen the brace if your toes tingle, become numb, or turn cold and blue.  Keep the brace clean.  If the brace is not waterproof: ? Do not let it get wet. ? Cover it with a watertight covering when you take a bath or shower. Managing pain, stiffness, and swelling   If directed, put ice on your knee: ? If you have a removable brace,  remove it as told by your health care provider. ? Put ice in a plastic bag. ? Place a towel between your skin and the bag. ? Leave the ice on for 20 minutes, 2-3 times a day.  Move your foot often to reduce stiffness and swelling.  Raise (elevate) your knee above the level of your heart while you are sitting or lying down. Medicines  Take over-the-counter and prescription medicines only as told by your health care provider.  Ask your health care provider if the medicine prescribed to you: ? Requires you to avoid driving or using heavy  machinery. ? Can cause constipation. You may need to take actions to prevent or treat constipation, such as:  Drink enough fluid to keep your urine pale yellow.  Take over-the-counter or prescription medicines.  Eat foods that are high in fiber, such as beans, whole grains, and fresh fruits and vegetables.  Limit foods that are high in fat and processed sugars, such as fried or sweet foods. Activity  Ask your health care provider when it is safe to drive if you have a brace on your leg.  Return to your normal activities as told by your health care provider. Ask your health care provider what activities are safe for you.  Do exercises as told by your health care provider. General instructions  Do not use the injured limb to support your body weight until your health care provider says that you can. Use crutches or a walker as told by your health care provider.  Do not use any products that contain nicotine or tobacco, such as cigarettes, e-cigarettes, and chewing tobacco. These can delay healing. If you need help quitting, ask your health care provider.  Keep all follow-up visits as told by your health care provider. This is important. How is this prevented?  Warm up and stretch before being active.  Cool down and stretch after being active.  Give your body time to rest between periods of activity.  Make sure to use equipment that fits you.  If you wear cleats, make sure they are appropriate for your playing surface.  Be safe and responsible while being active. This will help you avoid falls.  Maintain physical fitness, including: ? Strength. ? Flexibility. ? Cardiovascular fitness. ? Endurance. Contact a health care provider if:  Your symptoms are not improving.  Your symptoms are getting worse. Summary  A tear in the anterior cruciate ligament (ACL) can cause pain and make it hard for you to use your leg to support your body weight.  Treatment for this condition  may include resting, icing, wearing compression wraps or a knee brace, taking medicines, or having surgery.  Do not use the injured limb to support your body weight until your health care provider says that you can. Use crutches or a walker as told by your health care provider.  Contact a health care provider if your symptoms do not improve or get worse.  Keep all follow-up visits as told by your health care provider. This is important. This information is not intended to replace advice given to you by your health care provider. Make sure you discuss any questions you have with your health care provider. Document Released: 12/03/2004 Document Revised: 12/23/2017 Document Reviewed: 12/23/2017 Elsevier Patient Education  2020 [employer].

## 2018-11-15 NOTE — Progress Notes (Signed)
Katrina Fernandez is a 53 y.o. female who presents to [facility] today for left knee injury.  Patient was at the gym working out jumping from an inside leg position to her leg a bit wider than shoulder with apart.  When she landed she felt a pop and developed significant knee pain and swelling.  She is developed almost immediate joint effusion and now has quite a bit of swelling at the posterior aspect of the knee.  She had some discomfort at the posterior knee as well.  She has difficulty walking because the knee is painful.  She denies severe instability.  She denies any radiating pain weakness or numbness distally.    ROS:  As above  Exam:  BP 139/85   Pulse (!) 111   Ht 5\' 3"  (1.6 m)   Wt 181 lb (82.1 kg)   SpO2 99%   BMI 32.06 kg/m  Wt Readings from Last 5 Encounters:  11/15/18 181 lb (82.1 kg)  10/11/18 179 lb (81.2 kg)  09/27/18 177 lb 9.6 oz (80.6 kg)  07/12/18 184 lb (83.5 kg)  06/27/18 188 lb (85.3 kg)   General: Well Developed, well nourished, and in no acute distress.  Neuro/Psych: Alert and oriented x3, extra-ocular muscles intact, able to move all 4 extremities, sensation grossly intact. Skin: Warm and dry, no rashes noted.  Respiratory: Not using accessory muscles, speaking in full sentences, trachea midline.  Cardiovascular: Pulses palpable, no extremity edema. Abdomen: Does not appear distended. MSK: Left knee: Moderate effusion. No obvious deformity or significant erythema or induration. Tender and slightly swollen to palpation at the posterior knee.  Intact nontender palpable quad tendon and patella tendon. Not particular tender at medial and lateral joint line. Intact palpable medial and lateral hamstring tendons. Range of motion 10-90 degrees Intact extension and flexion strength. Patient guards with ligamentous exam testing unable to perform anterior posterior drawer test. Guarding with valgus varus stress  test but no obvious laxity. Significant antalgic gait.   Lab and Radiology Results X-ray images left knee personally independently reviewed X-ray images show mild medial compartment degenerative changes bilaterally.  No acute fractures visible.  No significant deformity.  No masses visible. Await formal radiology review      Assessment and Plan: 53 y.o. female with left knee pain swelling occurring after an injury.  Mechanism of injury is concerning for possible ACL rupture.  Additionally meniscus injury is also a possibility as it is OCD or impact fracture not visible on x-ray. Patient has significant swelling and guarding at the time of the exam and I cannot fully evaluate ligamentous stability.  Plan for hinged knee brace compression ice and topical NSAIDs.  Recheck in 1 to 2 weeks.  At that time we will repeat exam.  If not improving patient will benefit from further advanced imaging.  Unfortunately she has implanted spinal stimulator and cannot have MRI.  Next best test would be CT arthrogram.   PDMP not reviewed this encounter. Orders Placed This Encounter  Procedures  . DG Knee Complete 4 Views Left    Please include patellar sunrise, lateral, and weightbearing bilateral AP and bilateral rosenberg views    Standing Status:   Future    Standing Expiration Date:   01/15/2020    Order Specific Question:   Reason for exam:    Answer:   Please include patellar sunrise, lateral, and weightbearing bilateral AP and bilateral rosenberg views    Comments:   Please  include patellar sunrise, lateral, and weightbearing bilateral AP and bilateral rosenberg views    Order Specific Question:   Preferred imaging location?    Answer:   [facility]  . DG Knee 1-2 Views Right    Standing Status:   Future    Standing Expiration Date:   01/16/2020    Order Specific Question:   Reason for Exam (SYMPTOM  OR DIAGNOSIS REQUIRED)    Answer:   For use with the left knee x-ray bilateral AP and  Rosenberg standing.    Order Specific Question:   Is the patient pregnant?    Answer:   No    Order Specific Question:   Preferred imaging location?    Answer:   [facility]   No orders of the defined types were placed in this encounter.   Historical information moved to improve visibility of documentation.  Past Medical History:  Diagnosis Date  . Anemia   . Anxiety   . Arthritis    "back and knees" (07/23/2016)  . Chiari malformation type I ([place]) dx'd 2014  . Chronic back pain    buldging, DDD; "neck and lower back" (07/23/2016)  . Chronic constipation    takes Miralax daily  . Chronic pain syndrome    takes Methadone and Keppra daily  . Depression    takes Effexor daily  . Endometriosis    1995  . Fibromyalgia   . GERD (gastroesophageal reflux disease)    not taking anything at the present time  . History of DVT of lower extremity 07/2012   LEFT LEG --  3/ 2014.Was on a blood transfusion for 6 months  . History of kidney stones   . History of MRSA infection 10 yrs ago  . Hyperlipidemia    takes Simvastatin daily  . Migraine    "daily recently" (07/23/2016)  . Muscle spasm    takes Zanaflex nightly  . Peripheral edema    takes Lasix daily as needed  . Peripheral neuropathy   . Pneumonia    hx of-7-8 yrs ago  . PONV (postoperative nausea and vomiting)   . Seasonal allergies    takes Claritin daily  . Spinal headache    "since 2002; still get them; try to manage it w/lying flat"  . Weakness    and numbness in right hand   Past Surgical History:  Procedure Laterality Date  . ANTERIOR CERVICAL DECOMP/DISCECTOMY FUSION  07-25-2009   C5 -- C6  . APPENDECTOMY  age 57  . APPLICATION OF CRANIAL NAVIGATION N/A 06/23/2017   Procedure: APPLICATION OF CRANIAL NAVIGATION;  Surgeon: Kary Kos, MD;  Location: [facility];  Service: Neurosurgery;  Laterality: N/A;  . BACK SURGERY    . BREAST REDUCTION SURGERY Bilateral 06/07/2013   Procedure: BILATERAL MAMMARY REDUCTION   (BREAST);  Surgeon: Theodoro Kos, DO;  Location: [facility];  Service: Plastics;  Laterality: Bilateral;  . BREAST REDUCTION SURGERY Bilateral 06/07/2013   Procedure:  LIPOSUCTION;  Surgeon: Theodoro Kos, DO;  Location: [facility];  Service: Plastics;  Laterality: Bilateral;  . CARPAL TUNNEL RELEASE Bilateral 2001  . COLONOSCOPY  X 2   "polyps were benign"  . DILATION AND CURETTAGE OF UTERUS    . ESOPHAGOGASTRODUODENOSCOPY    . gastric banding undone  11/2014  . LAPAROSCOPIC CHOLECYSTECTOMY  ~ 2000  . LAPAROSCOPIC GASTRIC BANDING  2011  . LAPAROSCOPIC REVISION VENTRICULAR-PERITONEAL (V-P) SHUNT N/A 06/23/2017   Procedure: Shunt Placment stereotactic ventricular catheter placement and general  surgery abdominal exposure  (Laparoscopic vs. Open) Brain Lab;  Surgeon: Clovis Riley, MD;  Location: [facility];  Service: General;  Laterality: N/A;  . LAPAROSCOPY N/A 06/23/2017   Procedure: LAPAROSCOPY DIAGNOSTIC;  Surgeon: Clovis Riley, MD;  Location: [facility];  Service: General;  Laterality: N/A;  . LUMBAR LAMINECTOMY/DECOMPRESSION MICRODISCECTOMY N/A 07/23/2016   Procedure: Decompressive Lumbar Laminectomy Lumbar two-three  for Repair of  Cerberal Spinal Fluid  Leak;  Surgeon: Kary Kos, MD;  Location: [facility];  Service: Neurosurgery;  Laterality: N/A;  . PLACEMENT LUMBOPERITONEAL SHUNT FOR CEREBROSPINAL FLUID DIVERSION AWAY FROM C8 PERINEURAL CYST  09-05-2003  &  12-24-2003   08-31-2003  REMOVAL SHUNT  . POSTERIOR CERVICAL LAMINECTOMY  03-01-2001   C7-8 and Exploration C8 perineural cyst  . POSTERIOR LUMBAR LAMINECTOMY L4-5/  DISKECTOMY L5-S1  05-20-2005  . RE-DO DECOMPRESSION LAMINECTOMY AND FUSION L4-5  &  L5-S1  12-20-2006   02-04-2007--  RE-EXPLORATION L4 to S1, I & D LUMBAR WOUND HEMATOMA  . REDUCTION MAMMAPLASTY    . SHUNT REMOVAL N/A 07/10/2016   Procedure: SHUNT REMOVAL;  Surgeon: Kary Kos, MD;  Location: [facility];  Service: Neurosurgery;  Laterality: N/A;  SHUNT  REMOVAL  . SPINAL CORD STIMULATOR INSERTION N/A 04/26/2015   Procedure: LUMBAR SPINAL CORD STIMULATOR INSERTION;  Surgeon: Clydell Hakim, MD;  Location: [facility];  Service: Neurosurgery;  Laterality: N/A;  LUMBAR SPINAL CORD STIMULATOR INSERTION  . TENNIS ELBOW RELEASE/NIRSCHEL PROCEDURE Left 12/13/2014   Procedure: LEFT ELBOW LATERAL EPICONDYLAR DEBRIDEMENT AND OR REPAIR -RECONSTRUCTION ;  Surgeon: Iran Planas, MD;  Location: [facility];  Service: Orthopedics;  Laterality: Left;  . TOTAL ABDOMINAL HYSTERECTOMY  1994   w/  Bilateral Salpinoophorectomy  . VENTRICULOPERITONEAL SHUNT N/A 06/23/2017   Procedure: VENTRICULAR-PERITONEAL SHUNT INSERTION WITH ABDOMINAL LAPARASCOPY AND BRAINLAB NAVIGATION;  Surgeon: Kary Kos, MD;  Location: [facility];  Service: Neurosurgery;  Laterality: N/A;   Social History   Tobacco Use  . Smoking status: Never Smoker  . Smokeless tobacco: Never Used  Substance Use Topics  . Alcohol use: No   family history includes Colon cancer in her father; Depression in an other family member; Ovarian cancer in her maternal grandmother; Pancreatic cancer in her maternal grandfather.  Medications: Current Outpatient Medications  Medication Sig Dispense Refill  . busPIRone (BUSPAR) 10 MG tablet TAKE 1 TABLET BY MOUTH THREE TIMES A DAY 270 tablet 1  . DULoxetine (CYMBALTA) 60 MG capsule TAKE 2 CAPSULES BY MOUTH EVERY DAY 180 capsule 1  . furosemide (LASIX) 40 MG tablet TAKE 1 TABLET BY MOUTH TWICE A DAY 180 tablet 1  . methadone (DOLOPHINE) 5 MG tablet Take 2.5 mg by mouth every morning.   0  . montelukast (SINGULAIR) 10 MG tablet TAKE 1 TABLET (10 MG TOTAL) BY MOUTH AT BEDTIME. 90 tablet 1  . OZEMPIC, 1 MG/DOSE, 2 MG/1.5ML SOPN     . pramipexole (MIRAPEX) 0.5 MG tablet Take 1 tablet (0.5 mg total) by mouth at bedtime. MUST SCHEDULE AND KEEP APPOINTMENT FOR REFILLS 90 tablet 0  . simvastatin (ZOCOR) 20 MG tablet TAKE 1 TABLET BY MOUTH DAILY. 90 tablet 1  .  tiZANidine (ZANAFLEX) 4 MG capsule Take 4-8 mg by mouth 2 (two) times daily. ONE TAB AM - PRN AND 2 TABS AT BEDTIME    . traMADol (ULTRAM) 50 MG tablet Take 50-100 mg by mouth at bedtime.      No current facility-administered medications for this visit.    Allergies  Allergen Reactions  . Penicillins Anaphylaxis    Has patient had a PCN reaction causing immediate rash, facial/tongue/throat swelling, SOB or lightheadedness with hypotension: Yes Has patient had a PCN reaction causing severe rash involving mucus membranes or skin necrosis: No Has patient had a PCN reaction that required hospitalization No Has patient had a PCN reaction occurring within the last 10 years: No If all of the above answers are "NO", then may proceed with Cephalosporin use.   . Lactose Nausea And Vomiting and Swelling       . Chlorhexidine Rash and Other (See Comments)    Dermatitis - Allergic  . Tape Rash    Plastic tape, silicone tape      Discussed warning signs or symptoms. Please see discharge instructions. Patient expresses understanding.

## 2018-11-16 ENCOUNTER — Encounter: Payer: Self-pay | Admitting: Family Medicine

## 2018-11-16 DIAGNOSIS — M25562 Pain in left knee: Secondary | ICD-10-CM | POA: Diagnosis not present

## 2018-11-16 DIAGNOSIS — M1990 Unspecified osteoarthritis, unspecified site: Secondary | ICD-10-CM | POA: Diagnosis not present

## 2018-11-16 DIAGNOSIS — Z880 Allergy status to penicillin: Secondary | ICD-10-CM | POA: Diagnosis not present

## 2018-11-16 DIAGNOSIS — R69 Illness, unspecified: Secondary | ICD-10-CM | POA: Diagnosis not present

## 2018-11-16 DIAGNOSIS — G8911 Acute pain due to trauma: Secondary | ICD-10-CM | POA: Diagnosis not present

## 2018-11-16 DIAGNOSIS — Z79899 Other long term (current) drug therapy: Secondary | ICD-10-CM | POA: Diagnosis not present

## 2018-11-16 DIAGNOSIS — S8992XA Unspecified injury of left lower leg, initial encounter: Secondary | ICD-10-CM | POA: Diagnosis not present

## 2018-11-16 DIAGNOSIS — Z888 Allergy status to other drugs, medicaments and biological substances status: Secondary | ICD-10-CM | POA: Diagnosis not present

## 2018-11-16 DIAGNOSIS — E785 Hyperlipidemia, unspecified: Secondary | ICD-10-CM | POA: Diagnosis not present

## 2018-11-16 MED ORDER — OXYCODONE-ACETAMINOPHEN 5-325 MG PO TABS: 1.0000 | ORAL_TABLET | Freq: Three times a day (TID) | ORAL | 0 refills | Status: DC | PRN

## 2018-11-16 NOTE — Telephone Encounter (Signed)
I called Katrina Fernandez back.  She is having quite a bit of pain.  Plan to prescribe oxycodone for pain control.  I have called and left a message with her pain management clinic and will send a letter as well.  Recheck if not improving. PDMP reviewed during this encounter.

## 2018-11-21 ENCOUNTER — Other Ambulatory Visit: Payer: Self-pay | Admitting: Pharmacy Technician

## 2018-11-21 NOTE — Patient Outreach (Signed)
[facility]) Care Management  11/21/2018  Katrina Fernandez June 13, 1965 [id number]    Unsuccessful outreach call placed to patient in regards to [employer] application for [employer].  Unfortunately patient did not answer the phone, HIPAA compliant voicemail left.  Was calling patient to inquire if she has received the application that was mailed to her.  Will followup with 2nd outreach call in 3-5 business days.  Alano Blasco P. Amiracle Neises, [facility] [phone]

## 2018-11-22 DIAGNOSIS — Z79899 Other long term (current) drug therapy: Secondary | ICD-10-CM | POA: Diagnosis not present

## 2018-11-22 DIAGNOSIS — M7061 Trochanteric bursitis, right hip: Secondary | ICD-10-CM | POA: Diagnosis not present

## 2018-11-22 DIAGNOSIS — M961 Postlaminectomy syndrome, not elsewhere classified: Secondary | ICD-10-CM | POA: Diagnosis not present

## 2018-11-22 DIAGNOSIS — M79605 Pain in left leg: Secondary | ICD-10-CM | POA: Diagnosis not present

## 2018-11-24 ENCOUNTER — Other Ambulatory Visit: Payer: Self-pay | Admitting: Pharmacy Technician

## 2018-11-24 NOTE — Patient Outreach (Signed)
[facility]) Care Management  11/24/2018  Katrina Fernandez September 14, 1965 [id number]    Incoming call received from patient in regards to [employer] application for [employer].  Spoke to patient, HIPAA identifiers verified. Patient was returning my call. Patient had a few questions concerning documentation required for the patient assistance application. Discussed the process with patient. Patient verbalized understanding and informed she would place the application in the mail today.  Will followup with patient in 10-14 business days if not received.  Lindsay Straka P. Trebor Galdamez, [facility] [phone]

## 2018-11-29 ENCOUNTER — Other Ambulatory Visit: Payer: Self-pay

## 2018-11-29 ENCOUNTER — Encounter: Payer: Self-pay | Admitting: Family Medicine

## 2018-11-29 ENCOUNTER — Ambulatory Visit (INDEPENDENT_AMBULATORY_CARE_PROVIDER_SITE_OTHER): Payer: Medicare HMO | Admitting: Family Medicine

## 2018-11-29 VITALS — BP 124/88 | HR 96 | Temp 98.1°F | Wt 183.0 lb

## 2018-11-29 DIAGNOSIS — M25562 Pain in left knee: Secondary | ICD-10-CM

## 2018-11-29 NOTE — Patient Instructions (Signed)
Thank you for coming in today.  We will proceed with CT arthrogram.  We will get the Ct scan approved.  Once approved we will schedule the CT scan and a visit with me or Dr T about 1 hour prior.   Meniscus Tear  A meniscus tear is a knee injury that happens when a piece of the meniscus is torn. The meniscus is a thick, rubbery, wedge-shaped cartilage in the knee. Two menisci are located in each knee. They sit between the upper bone (femur) and lower bone (tibia) that make up the knee joint. Each meniscus acts as a shock absorber for the knee. A torn meniscus is one of the most common types of knee injuries. This injury can range from mild to severe. Surgery may be needed to repair a severe tear. What are the causes? This condition may be caused by any kneeling, squatting, twisting, or pivoting movement. Sports-related injuries are the most common cause. These often occur from:  Running and stopping suddenly. ? Changing direction. ? Being tackled or knocked off your feet.  Lifting or carrying heavy weights. As people get older, their menisci get thinner and weaker. In these people, tears can happen more easily, such as from climbing stairs. What increases the risk? You are more likely to develop this condition if you:  Play contact sports.  Have a job that requires kneeling or squatting.  Are female.  Are over 13 years old. What are the signs or symptoms? Symptoms of this condition include:  Knee pain, especially at the side of the knee joint. You may feel pain when the injury occurs, or you may only hear a pop and feel pain later.  A feeling that your knee is clicking, catching, locking, or giving way.  Not being able to fully bend or extend your knee.  Bruising or swelling in your knee. How is this diagnosed? This condition may be diagnosed based on your symptoms and a physical exam. You may also have tests, such as:  X-rays.  MRI.  A procedure to look inside your knee  with a narrow surgical telescope (arthroscopy). You may be referred to a knee specialist (orthopedic surgeon). How is this treated? Treatment for this injury depends on the severity of the tear. Treatment for a mild tear may include:  Rest.  Medicine to reduce pain and swelling. This is usually a nonsteroidal anti-inflammatory drug (NSAID), like ibuprofen.  A knee brace, sleeve, or wrap.  Using crutches or a walker to keep weight off your knee and to help you walk.  Exercises to strengthen your knee (physical therapy). You may need surgery if you have a severe tear or if other treatments are not working. Follow these instructions at home: If you have a brace, sleeve, or wrap:  Wear it as told by your health care provider. Remove it only as told by your health care provider.  Loosen the brace, sleeve, or wrap if your toes tingle, become numb, or turn cold and blue.  Keep the brace, sleeve, or wrap clean and dry.  If the brace, sleeve, or wrap is not waterproof: ? Do not let it get wet. ? Cover it with a watertight covering when you take a bath or shower. Managing pain and swelling   Take over-the-counter and prescription medicines only as told by your health care provider.  If directed, put ice on your knee: ? If you have a removable brace, sleeve, or wrap, remove it as told by your health care  provider. ? Put ice in a plastic bag. ? Place a towel between your skin and the bag. ? Leave the ice on for 20 minutes, 2-3 times per day.  Move your toes often to avoid stiffness and to lessen swelling.  Raise (elevate) the injured area above the level of your heart while you are sitting or lying down. Activity  Do not use the injured limb to support your body weight until your health care provider says that you can. Use crutches or a walker as told by your health care provider.  Return to your normal activities as told by your health care provider. Ask your health care provider  what activities are safe for you.  Perform range-of-motion exercises only as told by your health care provider.  Begin doing exercises to strengthen your knee and leg muscles only as told by your health care provider. After you recover, your health care provider may recommend these exercises to help prevent another injury. General instructions  Use a knee brace, sleeve, or wrap as told by your health care provider.  Ask your health care provider when it is safe to drive if you have a brace, sleeve, or wrap on your knee.  Do not use any products that contain nicotine or tobacco, such as cigarettes, e-cigarettes, and chewing tobacco. If you need help quitting, ask your health care provider.  Ask your health care provider if the medicine prescribed to you: ? Requires you to avoid driving or using heavy machinery. ? Can cause constipation. You may need to take these actions to prevent or treat constipation:  Drink enough fluid to keep your urine pale yellow.  Take over-the-counter or prescription medicines.  Eat foods that are high in fiber, such as beans, whole grains, and fresh fruits and vegetables.  Limit foods that are high in fat and processed sugars, such as fried or sweet foods.  Keep all follow-up visits as told by your health care provider. This is important. Contact a health care provider if:  You have a fever.  Your knee becomes red, tender, or swollen.  Your pain medicine is not helping.  Your symptoms get worse or do not improve after 2 weeks of home care. Summary  A meniscus tear is a knee injury that happens when a piece of the meniscus is torn.  Treatment for this injury depends on the severity of the tear. You may need surgery if you have a severe tear or if other treatments are not working.  Rest, ice, and raise (elevate) your injured knee as told by your health care provider. This will help lessen pain and swelling.  Contact a health care provider if you  have new symptoms, or your symptoms get worse or do not improve after 2 weeks of home care.  Keep all follow-up visits as told by your health care provider. This is important. This information is not intended to replace advice given to you by your health care provider. Make sure you discuss any questions you have with your health care provider. Document Released: 07/25/2002 Document Revised: 11/16/2017 Document Reviewed: 11/16/2017 Elsevier Patient Education  2020 [employer].

## 2018-11-29 NOTE — Progress Notes (Signed)
Katrina Fernandez is a 53 y.o. female who presents to [facility] today for left knee pain.  Patient was seen on June 30 for left knee pain occurring after a twisting injury with aerobics class.  X-rays are unremarkable however patient had significant pain and guarding with exam.  I was somewhat concerned for ACL strain.  She was treated with hinged knee brace.  Additionally she had a limited amount of opiates prescribed for acute pain on top of her chronic pain management.  In the interim she notes that her swelling has improved a bit.  However she still notes knee instability and locking and catching and popping.  She cannot flex or extend her knee fully.  She notes it feels tight and painful.  The knee brace helps but not fully.  She cannot resume her normal level of activity.    ROS:  As above  Exam:  BP 124/88   Pulse 96   Temp 98.1 F (36.7 C) (Oral)   Wt 183 lb (83 kg)   BMI 32.42 kg/m  Wt Readings from Last 5 Encounters:  11/29/18 183 lb (83 kg)  11/15/18 181 lb (82.1 kg)  10/11/18 179 lb (81.2 kg)  09/27/18 177 lb 9.6 oz (80.6 kg)  07/12/18 184 lb (83.5 kg)   General: Well Developed, well nourished, and in no acute distress.  Neuro/Psych: Alert and oriented x3, extra-ocular muscles intact, able to move all 4 extremities, sensation grossly intact. Skin: Warm and dry, no rashes noted.  Respiratory: Not using accessory muscles, speaking in full sentences, trachea midline.  Cardiovascular: Pulses palpable, no extremity edema. Abdomen: Does not appear distended. MSK: Left knee: Moderate effusion. Range of motion 5-100 degrees. Tender to palpation medial and lateral joint line. No laxity to anterior or posterior drawer testing however patient guards with exam. No laxity to MCL stress testing.  Minimal pain. No significant laxity to LCL stress testing with moderate pain. Patient guards too much for McMurray's test. Intact flexion  extension strength. Significant antalgic gait.    Lab and Radiology Results Dg Knee 1-2 Views Right  Result Date: 11/16/2018 CLINICAL DATA:  Acute medial left knee pain after injury at the gym yesterday. EXAM: RIGHT KNEE - 1-2 VIEW; LEFT KNEE - COMPLETE 4+ VIEW COMPARISON:  Bilateral knee x-rays dated March 16, 2014. FINDINGS: Left knee: No acute fracture or malalignment. Small to moderate joint effusion. New mild medial compartment joint space narrowing. Bone mineralization is normal. Soft tissues are unremarkable. Right knee: No acute fracture or malalignment. No joint effusion. New mild medial compartment joint space narrowing. Bone mineralization is normal. Soft tissues are unremarkable. IMPRESSION: 1. Small to moderate left knee joint effusion. No acute osseous abnormality. 2. New bilateral mild medial compartment degenerative changes. Electronically Signed   By: Titus Dubin M.D.   On: 11/16/2018 08:14   Dg Knee Complete 4 Views Left  Result Date: 11/16/2018 CLINICAL DATA:  Acute medial left knee pain after injury at the gym yesterday. EXAM: RIGHT KNEE - 1-2 VIEW; LEFT KNEE - COMPLETE 4+ VIEW COMPARISON:  Bilateral knee x-rays dated March 16, 2014. FINDINGS: Left knee: No acute fracture or malalignment. Small to moderate joint effusion. New mild medial compartment joint space narrowing. Bone mineralization is normal. Soft tissues are unremarkable. Right knee: No acute fracture or malalignment. No joint effusion. New mild medial compartment joint space narrowing. Bone mineralization is normal. Soft tissues are unremarkable. IMPRESSION: 1. Small to moderate left knee joint effusion.  No acute osseous abnormality. 2. New bilateral mild medial compartment degenerative changes. Electronically Signed   By: Titus Dubin M.D.   On: 11/16/2018 08:14   I personally (independently) visualized and performed the interpretation of the images attached in this note.  Limited musculoskeletal ultrasound  left knee. Intact quadricep and patellar tendon.  Moderate suprapatellar effusion. Medial joint line structures appear to be intact.  No visible meniscus tear. Lateral joint line appears to be intact however tender palpation in this region with ultrasound probe.  No visible meniscus tear.  LCL has hypoechoic fluid tracking along the proximal origin but no obvious tear. Normal bony structures.     Assessment and Plan: 53 y.o. female with left knee pain and swelling.  Occurring after a traumatic event concerning obviously for ligament or meniscus injury.  Given significant mechanical symptoms lack of range of motion and inability to improve with limited conservative management I am highly concerned for significant injury such as bucket-handle tear or ACL tear.  Given patient's implanted spine stimulator she cannot have MRI.  We will proceed with CT arthrogram.  We will prior authorize CT arthrogram and return to clinic for injection prior to CT scan near future.  Discussed plan with patient who expresses understanding and agreement.  I spent 25 minutes with this patient, greater than 50% was face-to-face time counseling regarding treatment plan and options.Marland Kitchen  PDMP not reviewed this encounter. No orders of the defined types were placed in this encounter.  No orders of the defined types were placed in this encounter.   Historical information moved to improve visibility of documentation.  Past Medical History:  Diagnosis Date  . Anemia   . Anxiety   . Arthritis    "back and knees" (07/23/2016)  . Chiari malformation type I ([place]) dx'd 2014  . Chronic back pain    buldging, DDD; "neck and lower back" (07/23/2016)  . Chronic constipation    takes Miralax daily  . Chronic pain syndrome    takes Methadone and Keppra daily  . Depression    takes Effexor daily  . Endometriosis    1995  . Fibromyalgia   . GERD (gastroesophageal reflux disease)    not taking anything at the present time  .  History of DVT of lower extremity 07/2012   LEFT LEG --  3/ 2014.Was on a blood transfusion for 6 months  . History of kidney stones   . History of MRSA infection 10 yrs ago  . Hyperlipidemia    takes Simvastatin daily  . Migraine    "daily recently" (07/23/2016)  . Muscle spasm    takes Zanaflex nightly  . Peripheral edema    takes Lasix daily as needed  . Peripheral neuropathy   . Pneumonia    hx of-7-8 yrs ago  . PONV (postoperative nausea and vomiting)   . Seasonal allergies    takes Claritin daily  . Spinal headache    "since 2002; still get them; try to manage it w/lying flat"  . Weakness    and numbness in right hand   Past Surgical History:  Procedure Laterality Date  . ANTERIOR CERVICAL DECOMP/DISCECTOMY FUSION  07-25-2009   C5 -- C6  . APPENDECTOMY  age 50  . APPLICATION OF CRANIAL NAVIGATION N/A 06/23/2017   Procedure: APPLICATION OF CRANIAL NAVIGATION;  Surgeon: Kary Kos, MD;  Location: [facility];  Service: Neurosurgery;  Laterality: N/A;  . BACK SURGERY    . BREAST REDUCTION SURGERY Bilateral 06/07/2013  Procedure: BILATERAL MAMMARY REDUCTION  (BREAST);  Surgeon: Theodoro Kos, DO;  Location: [facility];  Service: Plastics;  Laterality: Bilateral;  . BREAST REDUCTION SURGERY Bilateral 06/07/2013   Procedure:  LIPOSUCTION;  Surgeon: Theodoro Kos, DO;  Location: [facility];  Service: Plastics;  Laterality: Bilateral;  . CARPAL TUNNEL RELEASE Bilateral 2001  . COLONOSCOPY  X 2   "polyps were benign"  . DILATION AND CURETTAGE OF UTERUS    . ESOPHAGOGASTRODUODENOSCOPY    . gastric banding undone  11/2014  . LAPAROSCOPIC CHOLECYSTECTOMY  ~ 2000  . LAPAROSCOPIC GASTRIC BANDING  2011  . LAPAROSCOPIC REVISION VENTRICULAR-PERITONEAL (V-P) SHUNT N/A 06/23/2017   Procedure: Shunt Placment stereotactic ventricular catheter placement and general surgery abdominal exposure  (Laparoscopic vs. Open) Brain Lab;  Surgeon: Clovis Riley, MD;  Location: [facility];  Service: General;  Laterality: N/A;  . LAPAROSCOPY N/A 06/23/2017   Procedure: LAPAROSCOPY DIAGNOSTIC;  Surgeon: Clovis Riley, MD;  Location: [facility];  Service: General;  Laterality: N/A;  . LUMBAR LAMINECTOMY/DECOMPRESSION MICRODISCECTOMY N/A 07/23/2016   Procedure: Decompressive Lumbar Laminectomy Lumbar two-three  for Repair of  Cerberal Spinal Fluid  Leak;  Surgeon: Kary Kos, MD;  Location: [facility];  Service: Neurosurgery;  Laterality: N/A;  . PLACEMENT LUMBOPERITONEAL SHUNT FOR CEREBROSPINAL FLUID DIVERSION AWAY FROM C8 PERINEURAL CYST  09-05-2003  &  12-24-2003   08-31-2003  REMOVAL SHUNT  . POSTERIOR CERVICAL LAMINECTOMY  03-01-2001   C7-8 and Exploration C8 perineural cyst  . POSTERIOR LUMBAR LAMINECTOMY L4-5/  DISKECTOMY L5-S1  05-20-2005  . RE-DO DECOMPRESSION LAMINECTOMY AND FUSION L4-5  &  L5-S1  12-20-2006   02-04-2007--  RE-EXPLORATION L4 to S1, I & D LUMBAR WOUND HEMATOMA  . REDUCTION MAMMAPLASTY    . SHUNT REMOVAL N/A 07/10/2016   Procedure: SHUNT REMOVAL;  Surgeon: Kary Kos, MD;  Location: [facility];  Service: Neurosurgery;  Laterality: N/A;  SHUNT REMOVAL  . SPINAL CORD STIMULATOR INSERTION N/A 04/26/2015   Procedure: LUMBAR SPINAL CORD STIMULATOR INSERTION;  Surgeon: Clydell Hakim, MD;  Location: [facility];  Service: Neurosurgery;  Laterality: N/A;  LUMBAR SPINAL CORD STIMULATOR INSERTION  . TENNIS ELBOW RELEASE/NIRSCHEL PROCEDURE Left 12/13/2014   Procedure: LEFT ELBOW LATERAL EPICONDYLAR DEBRIDEMENT AND OR REPAIR -RECONSTRUCTION ;  Surgeon: Iran Planas, MD;  Location: [facility];  Service: Orthopedics;  Laterality: Left;  . TOTAL ABDOMINAL HYSTERECTOMY  1994   w/  Bilateral Salpinoophorectomy  . VENTRICULOPERITONEAL SHUNT N/A 06/23/2017   Procedure: VENTRICULAR-PERITONEAL SHUNT INSERTION WITH ABDOMINAL LAPARASCOPY AND BRAINLAB NAVIGATION;  Surgeon: Kary Kos, MD;  Location: [facility];  Service: Neurosurgery;  Laterality: N/A;   Social History   Tobacco Use   . Smoking status: Never Smoker  . Smokeless tobacco: Never Used  Substance Use Topics  . Alcohol use: No   family history includes Colon cancer in her father; Depression in an other family member; Ovarian cancer in her maternal grandmother; Pancreatic cancer in her maternal grandfather.  Medications: Current Outpatient Medications  Medication Sig Dispense Refill  . busPIRone (BUSPAR) 10 MG tablet TAKE 1 TABLET BY MOUTH THREE TIMES A DAY 270 tablet 1  . diclofenac sodium (VOLTAREN) 1 % GEL Apply 4 g topically 4 (four) times daily. To affected joint. 100 g 11  . DULoxetine (CYMBALTA) 60 MG capsule TAKE 2 CAPSULES BY MOUTH EVERY DAY 180 capsule 1  . furosemide (LASIX) 40 MG tablet TAKE 1 TABLET BY MOUTH TWICE A DAY 180 tablet 1  .  methadone (DOLOPHINE) 5 MG tablet Take 2.5 mg by mouth every morning.   0  . montelukast (SINGULAIR) 10 MG tablet TAKE 1 TABLET (10 MG TOTAL) BY MOUTH AT BEDTIME. 90 tablet 1  . oxyCODONE-acetaminophen (PERCOCET/ROXICET) 5-325 MG tablet Take 1-2 tablets by mouth every 8 (eight) hours as needed. Take in addition to chronic pain medicine. 15 tablet 0  . OZEMPIC, 1 MG/DOSE, 2 MG/1.5ML SOPN     . pramipexole (MIRAPEX) 0.5 MG tablet Take 1 tablet (0.5 mg total) by mouth at bedtime. MUST SCHEDULE AND KEEP APPOINTMENT FOR REFILLS 90 tablet 0  . simvastatin (ZOCOR) 20 MG tablet TAKE 1 TABLET BY MOUTH DAILY. 90 tablet 1  . tiZANidine (ZANAFLEX) 4 MG capsule Take 4-8 mg by mouth 2 (two) times daily. ONE TAB AM - PRN AND 2 TABS AT BEDTIME    . traMADol (ULTRAM) 50 MG tablet Take 50-100 mg by mouth at bedtime.      No current facility-administered medications for this visit.    Allergies  Allergen Reactions  . Penicillins Anaphylaxis    Has patient had a PCN reaction causing immediate rash, facial/tongue/throat swelling, SOB or lightheadedness with hypotension: Yes Has patient had a PCN reaction causing severe rash involving mucus membranes or skin necrosis: No Has patient  had a PCN reaction that required hospitalization No Has patient had a PCN reaction occurring within the last 10 years: No If all of the above answers are "NO", then may proceed with Cephalosporin use.   . Lactose Nausea And Vomiting and Swelling       . Chlorhexidine Rash and Other (See Comments)    Dermatitis - Allergic  . Tape Rash    Plastic tape, silicone tape      Discussed warning signs or symptoms. Please see discharge instructions. Patient expresses understanding.

## 2018-12-01 ENCOUNTER — Ambulatory Visit (INDEPENDENT_AMBULATORY_CARE_PROVIDER_SITE_OTHER): Payer: Medicare HMO

## 2018-12-01 ENCOUNTER — Other Ambulatory Visit: Payer: Self-pay

## 2018-12-01 ENCOUNTER — Ambulatory Visit (INDEPENDENT_AMBULATORY_CARE_PROVIDER_SITE_OTHER): Payer: Medicare HMO | Admitting: Family Medicine

## 2018-12-01 ENCOUNTER — Encounter: Payer: Self-pay | Admitting: Family Medicine

## 2018-12-01 VITALS — BP 133/102 | HR 90 | Temp 98.1°F | Wt 183.0 lb

## 2018-12-01 DIAGNOSIS — M25562 Pain in left knee: Secondary | ICD-10-CM

## 2018-12-01 DIAGNOSIS — S8992XA Unspecified injury of left lower leg, initial encounter: Secondary | ICD-10-CM | POA: Diagnosis not present

## 2018-12-01 NOTE — Progress Notes (Signed)
    Patient presents to clinic for  previously scheduled CT contrast interarticular injection for CT arthrogram left knee.  Procedure: Real-time Ultrasound Guided Injection of left knee Device: GE Logiq E  Images permanently stored and available for review in the ultrasound unit. Verbal informed consent obtained. Discussed risks and benefits of procedure. Warned about infection bleeding damage to structures skin hypopigmentation and fat atrophy among others. Patient expresses understanding and agreement Time-out conducted.  Noted no overlying erythema, induration, or other signs of local infection.  Skin prepped in a sterile fashion.  Local anesthesia: Topical Ethyl chloride.  With sterile technique and under real time ultrasound guidance:5 mL of Marcaine, 20 mg of Isovue CT contrast, and 20 mg sterile saline flush injected easily.  Completed without difficulty    Advised to call if fevers/chills, erythema, induration, drainage, or persistent bleeding.  Images permanently stored and available for review in the ultrasound unit.  Impression: Technically successful ultrasound guided injection.

## 2018-12-02 ENCOUNTER — Other Ambulatory Visit: Payer: Self-pay | Admitting: Pharmacy Technician

## 2018-12-02 ENCOUNTER — Other Ambulatory Visit: Payer: Self-pay | Admitting: Family Medicine

## 2018-12-02 NOTE — Patient Outreach (Signed)
[facility]) Care Management  12/02/2018  Katrina Fernandez 02-07-1966 [id number]    Received all necessary documents from both patient and provider for [employer] patient assistance for Ozempic.  Submitted completed application via fax.  Will followup with Novo Nordisk in 3-5 business days to inquire on status of application.  Zachery Niswander P. [name], [facility] Management [phone]

## 2018-12-05 ENCOUNTER — Encounter: Payer: Self-pay | Admitting: Sports Medicine

## 2018-12-05 ENCOUNTER — Ambulatory Visit (INDEPENDENT_AMBULATORY_CARE_PROVIDER_SITE_OTHER): Payer: Medicare HMO | Admitting: Sports Medicine

## 2018-12-05 DIAGNOSIS — M170 Bilateral primary osteoarthritis of knee: Secondary | ICD-10-CM

## 2018-12-05 NOTE — Assessment & Plan Note (Signed)
Severe left knee pain, CT arthrogram done due to metal implant shows significant chondral fissuring and thinning at the medial femoral condyle. Steroid injection performed today, she did well in the past with Visco supplementation, if this fails we can get her approved for Orthovisc.

## 2018-12-05 NOTE — Progress Notes (Signed)
Subjective:    CC: Severe left knee pain  HPI: This is a pleasant 53 year old female, she recently had a CT arthrogram of the knee, we looked at it and did find some chondral fissuring at the medial femoral condyle, I called radiology and they will addend the report.  Today she has severe pain at the medial joint line, persistent, localized without radiation.  I reviewed the past medical history, family history, social history, surgical history, and allergies today and no changes were needed.  Please see the problem list section below in epic for further details.  Past Medical History: Past Medical History:  Diagnosis Date  . Anemia   . Anxiety   . Arthritis    "back and knees" (07/23/2016)  . Chiari malformation type I ([place]) dx'd 2014  . Chronic back pain    buldging, DDD; "neck and lower back" (07/23/2016)  . Chronic constipation    takes Miralax daily  . Chronic pain syndrome    takes Methadone and Keppra daily  . Depression    takes Effexor daily  . Endometriosis    1995  . Fibromyalgia   . GERD (gastroesophageal reflux disease)    not taking anything at the present time  . History of DVT of lower extremity 07/2012   LEFT LEG --  3/ 2014.Was on a blood transfusion for 6 months  . History of kidney stones   . History of MRSA infection 10 yrs ago  . Hyperlipidemia    takes Simvastatin daily  . Migraine    "daily recently" (07/23/2016)  . Muscle spasm    takes Zanaflex nightly  . Peripheral edema    takes Lasix daily as needed  . Peripheral neuropathy   . Pneumonia    hx of-7-8 yrs ago  . PONV (postoperative nausea and vomiting)   . Seasonal allergies    takes Claritin daily  . Spinal headache    "since 2002; still get them; try to manage it w/lying flat"  . Weakness    and numbness in right hand   Past Surgical History: Past Surgical History:  Procedure Laterality Date  . ANTERIOR CERVICAL DECOMP/DISCECTOMY FUSION  07-25-2009   C5 -- C6  . APPENDECTOMY  age 47   . APPLICATION OF CRANIAL NAVIGATION N/A 06/23/2017   Procedure: APPLICATION OF CRANIAL NAVIGATION;  Surgeon: Kary Kos, MD;  Location: [facility];  Service: Neurosurgery;  Laterality: N/A;  . BACK SURGERY    . BREAST REDUCTION SURGERY Bilateral 06/07/2013   Procedure: BILATERAL MAMMARY REDUCTION  (BREAST);  Surgeon: Theodoro Kos, DO;  Location: [facility];  Service: Plastics;  Laterality: Bilateral;  . BREAST REDUCTION SURGERY Bilateral 06/07/2013   Procedure:  LIPOSUCTION;  Surgeon: Theodoro Kos, DO;  Location: [facility];  Service: Plastics;  Laterality: Bilateral;  . CARPAL TUNNEL RELEASE Bilateral 2001  . COLONOSCOPY  X 2   "polyps were benign"  . DILATION AND CURETTAGE OF UTERUS    . ESOPHAGOGASTRODUODENOSCOPY    . gastric banding undone  11/2014  . LAPAROSCOPIC CHOLECYSTECTOMY  ~ 2000  . LAPAROSCOPIC GASTRIC BANDING  2011  . LAPAROSCOPIC REVISION VENTRICULAR-PERITONEAL (V-P) SHUNT N/A 06/23/2017   Procedure: Shunt Placment stereotactic ventricular catheter placement and general surgery abdominal exposure  (Laparoscopic vs. Open) Brain Lab;  Surgeon: Clovis Riley, MD;  Location: [facility];  Service: General;  Laterality: N/A;  . LAPAROSCOPY N/A 06/23/2017   Procedure: LAPAROSCOPY DIAGNOSTIC;  Surgeon: Clovis Riley, MD;  Location: [facility];  Service: General;  Laterality: N/A;  . LUMBAR LAMINECTOMY/DECOMPRESSION MICRODISCECTOMY N/A 07/23/2016   Procedure: Decompressive Lumbar Laminectomy Lumbar two-three  for Repair of  Cerberal Spinal Fluid  Leak;  Surgeon: Kary Kos, MD;  Location: [facility];  Service: Neurosurgery;  Laterality: N/A;  . PLACEMENT LUMBOPERITONEAL SHUNT FOR CEREBROSPINAL FLUID DIVERSION AWAY FROM C8 PERINEURAL CYST  09-05-2003  &  12-24-2003   08-31-2003  REMOVAL SHUNT  . POSTERIOR CERVICAL LAMINECTOMY  03-01-2001   C7-8 and Exploration C8 perineural cyst  . POSTERIOR LUMBAR LAMINECTOMY L4-5/  DISKECTOMY L5-S1  05-20-2005  . RE-DO DECOMPRESSION  LAMINECTOMY AND FUSION L4-5  &  L5-S1  12-20-2006   02-04-2007--  RE-EXPLORATION L4 to S1, I & D LUMBAR WOUND HEMATOMA  . REDUCTION MAMMAPLASTY    . SHUNT REMOVAL N/A 07/10/2016   Procedure: SHUNT REMOVAL;  Surgeon: Kary Kos, MD;  Location: [facility];  Service: Neurosurgery;  Laterality: N/A;  SHUNT REMOVAL  . SPINAL CORD STIMULATOR INSERTION N/A 04/26/2015   Procedure: LUMBAR SPINAL CORD STIMULATOR INSERTION;  Surgeon: Clydell Hakim, MD;  Location: [facility];  Service: Neurosurgery;  Laterality: N/A;  LUMBAR SPINAL CORD STIMULATOR INSERTION  . TENNIS ELBOW RELEASE/NIRSCHEL PROCEDURE Left 12/13/2014   Procedure: LEFT ELBOW LATERAL EPICONDYLAR DEBRIDEMENT AND OR REPAIR -RECONSTRUCTION ;  Surgeon: Iran Planas, MD;  Location: [facility];  Service: Orthopedics;  Laterality: Left;  . TOTAL ABDOMINAL HYSTERECTOMY  1994   w/  Bilateral Salpinoophorectomy  . VENTRICULOPERITONEAL SHUNT N/A 06/23/2017   Procedure: VENTRICULAR-PERITONEAL SHUNT INSERTION WITH ABDOMINAL LAPARASCOPY AND BRAINLAB NAVIGATION;  Surgeon: Kary Kos, MD;  Location: [facility];  Service: Neurosurgery;  Laterality: N/A;   Social History: Social History   Socioeconomic History  . Marital status: Married    Spouse name: [name]  . Number of children: 2  . Years of education: Assoc  . Highest education level: Associate degree: academic program  Occupational History  . Occupation: Disabled   Social Needs  . Financial resource strain: Not on file  . Food insecurity    Worry: Not on file    Inability: Not on file  . Transportation needs    Medical: No    Non-medical: No  Tobacco Use  . Smoking status: Never Smoker  . Smokeless tobacco: Never Used  Substance and Sexual Activity  . Alcohol use: No  . Drug use: No  . Sexual activity: Not Currently    Birth control/protection: Surgical  Lifestyle  . Physical activity    Days per week: Not on file    Minutes per session: Not on file  . Stress: Only a little   Relationships  . Social Herbalist on phone: Not on file    Gets together: Not on file    Attends religious service: Not on file    Active member of club or organization: Not on file    Attends meetings of clubs or organizations: Not on file    Relationship status: Not on file  Other Topics Concern  . Not on file  Social History Narrative   Caffeine: occasionally    Katrina Fernandez is married, on permanent disability and lives at home with her husband, her support system. She is independent in her care needs. She denies concern with transportation to medical appointments She is on a fixed income   Family History: Family History  Problem Relation Age of Onset  . Depression Other   . Colon cancer Father   . Ovarian cancer Maternal Grandmother   .  Pancreatic cancer Maternal Grandfather    Allergies: Allergies  Allergen Reactions  . Penicillins Anaphylaxis    Has patient had a PCN reaction causing immediate rash, facial/tongue/throat swelling, SOB or lightheadedness with hypotension: Yes Has patient had a PCN reaction causing severe rash involving mucus membranes or skin necrosis: No Has patient had a PCN reaction that required hospitalization No Has patient had a PCN reaction occurring within the last 10 years: No If all of the above answers are "NO", then may proceed with Cephalosporin use.   . Lactose Nausea And Vomiting and Swelling       . Chlorhexidine Rash and Other (See Comments)    Dermatitis - Allergic  . Tape Rash    Plastic tape, silicone tape   Medications: See med rec.  Review of Systems: No fevers, chills, night sweats, weight loss, chest pain, or shortness of breath.   Objective:    General: Well Developed, well nourished, and in no acute distress.  Neuro: Alert and oriented x3, extra-ocular muscles intact, sensation grossly intact.  HEENT: Normocephalic, atraumatic, pupils equal round reactive to light, neck supple, no masses, no lymphadenopathy,  thyroid nonpalpable.  Skin: Warm and dry, no rashes. Cardiac: Regular rate and rhythm, no murmurs rubs or gallops, no lower extremity edema.  Respiratory: Clear to auscultation bilaterally. Not using accessory muscles, speaking in full sentences. Left knee: Normal to inspection with no erythema or effusion or obvious bony abnormalities. Tender to palpation at the medial joint line in the anterior aspect of the medial femoral condyle. ROM normal in flexion and extension and lower leg rotation. Ligaments with solid consistent endpoints including ACL, PCL, LCL, MCL. Negative Mcmurray's and provocative meniscal tests. Non painful patellar compression. Patellar and quadriceps tendons unremarkable. Hamstring and quadriceps strength is normal.  Procedure: Real-time Ultrasound Guided injection of the left knee Device: GE Logiq E  Verbal informed consent obtained.  Time-out conducted.  Noted no overlying erythema, induration, or other signs of local infection.  Skin prepped in a sterile fashion.  Local anesthesia: Topical Ethyl chloride.  With sterile technique and under real time ultrasound guidance:  1 cc Kenalog 40, 2 cc lidocaine, 2 cc bupivacaine injected easily Completed without difficulty  Pain immediately resolved suggesting accurate placement of the medication.  Advised to call if fevers/chills, erythema, induration, drainage, or persistent bleeding.  Images permanently stored and available for review in the ultrasound unit.  Impression: Technically successful ultrasound guided injection.  Impression and Recommendations:    Primary osteoarthritis of both knees Severe left knee pain, CT arthrogram done due to metal implant shows significant chondral fissuring and thinning at the medial femoral condyle. Steroid injection performed today, she did well in the past with Visco supplementation, if this fails we can get her approved for Orthovisc.    ___________________________________________ Gwen Her. Dianah Field, M.D., ABFM., CAQSM. Primary Care and Sports Medicine [facility]  Adjunct Professor of [place] [employer] [facility] [employer]

## 2018-12-06 ENCOUNTER — Encounter: Payer: Self-pay | Admitting: Sports Medicine

## 2018-12-06 ENCOUNTER — Ambulatory Visit (INDEPENDENT_AMBULATORY_CARE_PROVIDER_SITE_OTHER): Payer: Medicare HMO | Admitting: Psychology

## 2018-12-06 DIAGNOSIS — F4323 Adjustment disorder with mixed anxiety and depressed mood: Secondary | ICD-10-CM

## 2018-12-06 DIAGNOSIS — F411 Generalized anxiety disorder: Secondary | ICD-10-CM | POA: Diagnosis not present

## 2018-12-06 DIAGNOSIS — R69 Illness, unspecified: Secondary | ICD-10-CM | POA: Diagnosis not present

## 2018-12-07 ENCOUNTER — Other Ambulatory Visit: Payer: Self-pay | Admitting: Family Medicine

## 2018-12-08 DIAGNOSIS — Z6832 Body mass index (BMI) 32.0-32.9, adult: Secondary | ICD-10-CM | POA: Diagnosis not present

## 2018-12-08 DIAGNOSIS — M5416 Radiculopathy, lumbar region: Secondary | ICD-10-CM | POA: Diagnosis not present

## 2018-12-09 ENCOUNTER — Other Ambulatory Visit: Payer: Self-pay | Admitting: Pharmacy Technician

## 2018-12-09 NOTE — Patient Outreach (Signed)
[facility]) Care Management  12/09/2018  Katrina Fernandez May 22, 1965 [id number]   Care coordination call placed to Katrina Fernandez in regards to patient's application for Ozempic.  Spoke to Katrina Fernandez who informed that the application was under appeal due to the number of people in the household. Patient put 2 total people with 1 of those being under 18 which would make patient ineligible due to being over income.Katrina Fernandez said according to the 1040 there are 3 total people in the household with 1 being under 18 and therefore application needs to reflect that. She informed to correct on application and refax it in. She informed to check back sometime next week as appeals can take a few extra days than normal processing.  Will followup with [employer] in 1-5 business days.  Katrina Fernandez, [place] Management [phone]

## 2018-12-14 ENCOUNTER — Other Ambulatory Visit: Payer: Self-pay | Admitting: Pharmacy Technician

## 2018-12-14 NOTE — Patient Outreach (Signed)
[facility]) Care Management  12/14/2018  Katrina Fernandez Oct 24, 1965 [id number]    Care coordination call placed to [place] in regards to patient's Ozempic application.  Spoke to [place] who informed patient had been APPROVED 12/13/2018-05/18/2019. She informed the provider's office would receive a 4 month supply of medication in the next 10-14 business days.  Will followup with patient in 15-20 business days to inquire medication was received and picked up.  Malavika Lira P. Aleynah Rocchio, [place] Management [phone]

## 2018-12-15 DIAGNOSIS — R030 Elevated blood-pressure reading, without diagnosis of hypertension: Secondary | ICD-10-CM | POA: Diagnosis not present

## 2018-12-15 DIAGNOSIS — M5416 Radiculopathy, lumbar region: Secondary | ICD-10-CM | POA: Diagnosis not present

## 2018-12-15 DIAGNOSIS — Z6831 Body mass index (BMI) 31.0-31.9, adult: Secondary | ICD-10-CM | POA: Diagnosis not present

## 2018-12-16 ENCOUNTER — Other Ambulatory Visit: Payer: Self-pay | Admitting: Student

## 2018-12-16 ENCOUNTER — Telehealth: Payer: Self-pay | Admitting: Nurse Practitioner

## 2018-12-16 DIAGNOSIS — M5416 Radiculopathy, lumbar region: Secondary | ICD-10-CM

## 2018-12-16 NOTE — Telephone Encounter (Signed)
Phone call to patient to verify medication list and allergies for myelogram procedure. Pt instructed to hold Buspar, Cymbalta, and Tramadol for 48hrs prior to myelogram appointment time. Pt verbalized understanding. Pre and post procedure instructions reviewed with pt.

## 2018-12-20 ENCOUNTER — Ambulatory Visit: Payer: Medicare HMO | Admitting: Psychology

## 2018-12-22 ENCOUNTER — Ambulatory Visit: Payer: Medicare HMO | Admitting: Pharmacist

## 2018-12-22 ENCOUNTER — Telehealth: Payer: Self-pay | Admitting: Family Medicine

## 2018-12-22 ENCOUNTER — Ambulatory Visit: Payer: Self-pay | Admitting: Psychology

## 2018-12-22 DIAGNOSIS — M533 Sacrococcygeal disorders, not elsewhere classified: Secondary | ICD-10-CM | POA: Diagnosis not present

## 2018-12-22 DIAGNOSIS — M961 Postlaminectomy syndrome, not elsewhere classified: Secondary | ICD-10-CM | POA: Diagnosis not present

## 2018-12-22 DIAGNOSIS — M25562 Pain in left knee: Secondary | ICD-10-CM

## 2018-12-22 DIAGNOSIS — M5440 Lumbago with sciatica, unspecified side: Secondary | ICD-10-CM | POA: Diagnosis not present

## 2018-12-22 DIAGNOSIS — Z79899 Other long term (current) drug therapy: Secondary | ICD-10-CM | POA: Diagnosis not present

## 2018-12-22 DIAGNOSIS — Z5181 Encounter for therapeutic drug level monitoring: Secondary | ICD-10-CM | POA: Diagnosis not present

## 2018-12-22 NOTE — Telephone Encounter (Signed)
Patient continues to experience significant pain.  She did have an injury and has not returned to normal or been able to get her symptoms well controlled with typical conservative measures.  She is MRI incompatible and had CT arthrogram which which did not show severe injury.  I am suspicious that she may have injury that is just not seen on arthrogram.  Plan for referral to orthopedic surgery at this point for second opinion and possible surgical options.

## 2018-12-22 NOTE — Telephone Encounter (Signed)
Referral placed to [facility].

## 2018-12-22 NOTE — Telephone Encounter (Signed)
Patient does not want to come in and wants a referral to the Orthopedic surgeon that you recommend to give her some relief. She does not want to try and more medications. Please advise.

## 2018-12-27 ENCOUNTER — Encounter: Payer: Self-pay | Admitting: Family Medicine

## 2018-12-27 ENCOUNTER — Ambulatory Visit (INDEPENDENT_AMBULATORY_CARE_PROVIDER_SITE_OTHER): Payer: Medicare HMO | Admitting: Family Medicine

## 2018-12-27 VITALS — BP 127/78 | HR 93 | Temp 98.1°F | Wt 177.0 lb

## 2018-12-27 DIAGNOSIS — E782 Mixed hyperlipidemia: Secondary | ICD-10-CM | POA: Diagnosis not present

## 2018-12-27 DIAGNOSIS — G894 Chronic pain syndrome: Secondary | ICD-10-CM | POA: Diagnosis not present

## 2018-12-27 DIAGNOSIS — M25562 Pain in left knee: Secondary | ICD-10-CM | POA: Diagnosis not present

## 2018-12-27 DIAGNOSIS — G2581 Restless legs syndrome: Secondary | ICD-10-CM

## 2018-12-27 DIAGNOSIS — R7303 Prediabetes: Secondary | ICD-10-CM | POA: Diagnosis not present

## 2018-12-27 MED ORDER — PRAMIPEXOLE DIHYDROCHLORIDE 0.5 MG PO TABS: 0.5000 mg | ORAL_TABLET | Freq: Every day | ORAL | 3 refills | Status: DC

## 2018-12-27 NOTE — Progress Notes (Signed)
Katrina Fernandez is a 53 y.o. female who presents to [place]: [place] today for   Patient was in the process of applying for patient assistance for Ozempic.  Ozempic was approved and mailed to my office.  The epic for prediabetes/metabolic syndrome/obesity.  She has been continue to take it and feels happy with how things are going with the medication.  Knee pain: Patient suffered a knee injury a few months ago working out.  She had significant pain and limited functionality.  Ultimately she had CT arthrogram of her knee as she is MRI incompatible which did not show severe intra-articular injury.  However she continues to have significant pain and dysfunction in her knee.  She has an initial eval with orthopedics next week.  She notes that steroid injection has not helped much at all for her knee.  She also has restless leg syndrome which is well managed with Mirapex.  She like a refill today if possible.   ROS as above:  Exam:  BP 127/78    Pulse 93    Temp 98.1 F (36.7 C) (Oral)    Wt 177 lb (80.3 kg)    BMI 31.35 kg/m   Wt Readings from Last 5 Encounters:  12/27/18 177 lb (80.3 kg)  12/05/18 180 lb (81.6 kg)  12/01/18 183 lb (83 kg)  11/29/18 183 lb (83 kg)  11/15/18 181 lb (82.1 kg)    Gen: Well NAD HEENT: EOMI,  MMM Lungs: Normal work of breathing. CTABL Heart: RRR no MRG Abd: NABS, Soft. Nondistended, Nontender Exts: Brisk capillary refill, warm and well perfused.   Lab and Radiology Results No results found for this or any previous visit (from the past 72 hour(s)). No results found.    Assessment and Plan: 53 y.o. female with  Restless leg syndrome: Doing well continue Mirapex.  Prediabetes and obesity and hyperlipidemia: Due for recheck with labs.  She did take basic fasting labs now.  Knee pain: I am concerned there may be worse injury inside  the knee joint and is visible on CT arthrogram.  She is failing typical conservative management.  She has follow-up/initial eval appointment with orthopedics next week.    Additionally informed patient that I am transitioning to just sports medicine in [place] starting in November 2020.  Gave her some follow-up transition to other PCP options including within my practice and within local [facility] practices.  I will still be available for questions via MyChart or staff messaging after a transition as Vela has a somewhat complicated medical history.  Happy to still be a part of her care and help guide her new PCP.  PDMP reviewed during this encounter. Orders Placed This Encounter  Procedures   CBC   COMPLETE METABOLIC PANEL WITH GFR   Lipid Panel w/reflex Direct LDL   Hemoglobin A1c   Meds ordered this encounter  Medications   pramipexole (MIRAPEX) 0.5 MG tablet    Sig: Take 1 tablet (0.5 mg total) by mouth at bedtime.    Dispense:  90 tablet    Refill:  3     Historical information moved to improve visibility of documentation.  Past Medical History:  Diagnosis Date   Anemia    Anxiety    Arthritis    "back and knees" (07/23/2016)   Chiari malformation type I ([place]) dx'd 2014   Chronic back pain    buldging, DDD; "neck and lower back" (07/23/2016)  Chronic constipation    takes Miralax daily   Chronic pain syndrome    takes Methadone and Keppra daily   Depression    takes Effexor daily   Endometriosis    1995   Fibromyalgia    GERD (gastroesophageal reflux disease)    not taking anything at the present time   History of DVT of lower extremity 07/2012   LEFT LEG --  3/ 2014.Was on a blood transfusion for 6 months   History of kidney stones    History of MRSA infection 10 yrs ago   Hyperlipidemia    takes Simvastatin daily   Migraine    "daily recently" (07/23/2016)   Muscle spasm    takes Zanaflex nightly   Peripheral edema    takes Lasix daily as  needed   Peripheral neuropathy    Pneumonia    hx of-7-8 yrs ago   PONV (postoperative nausea and vomiting)    Seasonal allergies    takes Claritin daily   Spinal headache    "since 2002; still get them; try to manage it w/lying flat"   Weakness    and numbness in right hand   Past Surgical History:  Procedure Laterality Date   ANTERIOR CERVICAL DECOMP/DISCECTOMY FUSION  07-25-2009   C5 -- C6   APPENDECTOMY  age 37   APPLICATION OF CRANIAL NAVIGATION N/A 06/23/2017   Procedure: APPLICATION OF CRANIAL NAVIGATION;  Surgeon: Kary Kos, MD;  Location: [facility];  Service: Neurosurgery;  Laterality: N/A;   BACK SURGERY     BREAST REDUCTION SURGERY Bilateral 06/07/2013   Procedure: BILATERAL MAMMARY REDUCTION  (BREAST);  Surgeon: Theodoro Kos, DO;  Location: [facility];  Service: Plastics;  Laterality: Bilateral;   BREAST REDUCTION SURGERY Bilateral 06/07/2013   Procedure:  LIPOSUCTION;  Surgeon: Theodoro Kos, DO;  Location: [facility];  Service: Plastics;  Laterality: Bilateral;   CARPAL TUNNEL RELEASE Bilateral 2001   COLONOSCOPY  X 2   "polyps were benign"   DILATION AND CURETTAGE OF UTERUS     ESOPHAGOGASTRODUODENOSCOPY     gastric banding undone  11/2014   LAPAROSCOPIC CHOLECYSTECTOMY  ~ 2000   LAPAROSCOPIC GASTRIC BANDING  2011   LAPAROSCOPIC REVISION VENTRICULAR-PERITONEAL (V-P) SHUNT N/A 06/23/2017   Procedure: Shunt Placment stereotactic ventricular catheter placement and general surgery abdominal exposure  (Laparoscopic vs. Open) Brain Lab;  Surgeon: Clovis Riley, MD;  Location: [facility];  Service: General;  Laterality: N/A;   LAPAROSCOPY N/A 06/23/2017   Procedure: LAPAROSCOPY DIAGNOSTIC;  Surgeon: Clovis Riley, MD;  Location: [facility];  Service: General;  Laterality: N/A;   LUMBAR LAMINECTOMY/DECOMPRESSION MICRODISCECTOMY N/A 07/23/2016   Procedure: Decompressive Lumbar Laminectomy Lumbar two-three  for Repair of  Cerberal Spinal  Fluid  Leak;  Surgeon: Kary Kos, MD;  Location: [facility];  Service: Neurosurgery;  Laterality: N/A;   PLACEMENT LUMBOPERITONEAL SHUNT FOR CEREBROSPINAL FLUID DIVERSION AWAY FROM C8 PERINEURAL CYST  09-05-2003  &  12-24-2003   08-31-2003  REMOVAL SHUNT   POSTERIOR CERVICAL LAMINECTOMY  03-01-2001   C7-8 and Exploration C8 perineural cyst   POSTERIOR LUMBAR LAMINECTOMY L4-5/  DISKECTOMY L5-S1  05-20-2005   RE-DO DECOMPRESSION LAMINECTOMY AND FUSION L4-5  &  L5-S1  12-20-2006   02-04-2007--  RE-EXPLORATION L4 to S1, I & D LUMBAR WOUND HEMATOMA   REDUCTION MAMMAPLASTY     SHUNT REMOVAL N/A 07/10/2016   Procedure: SHUNT REMOVAL;  Surgeon: Kary Kos, MD;  Location: [place];  Service: Neurosurgery;  Laterality: N/A;  SHUNT REMOVAL   SPINAL CORD STIMULATOR INSERTION N/A 04/26/2015   Procedure: LUMBAR SPINAL CORD STIMULATOR INSERTION;  Surgeon: Clydell Hakim, MD;  Location: [place] [facility];  Service: Neurosurgery;  Laterality: N/A;  LUMBAR SPINAL CORD STIMULATOR INSERTION   TENNIS ELBOW RELEASE/NIRSCHEL PROCEDURE Left 12/13/2014   Procedure: LEFT ELBOW LATERAL EPICONDYLAR DEBRIDEMENT AND OR REPAIR -RECONSTRUCTION ;  Surgeon: Iran Planas, MD;  Location: [place];  Service: Orthopedics;  Laterality: Left;   TOTAL ABDOMINAL HYSTERECTOMY  1994   w/  Bilateral Salpinoophorectomy   VENTRICULOPERITONEAL SHUNT N/A 06/23/2017   Procedure: VENTRICULAR-PERITONEAL SHUNT INSERTION WITH ABDOMINAL LAPARASCOPY AND BRAINLAB NAVIGATION;  Surgeon: Kary Kos, MD;  Location: [place];  Service: Neurosurgery;  Laterality: N/A;   Social History   Tobacco Use   Smoking status: Never Smoker   Smokeless tobacco: Never Used  Substance Use Topics   Alcohol use: No   family history includes Colon cancer in her father; Depression in an other family member; Ovarian cancer in her maternal grandmother; Pancreatic cancer in her maternal grandfather.  Medications: Current Outpatient Medications  Medication Sig  Dispense Refill   busPIRone (BUSPAR) 10 MG tablet TAKE 1 TABLET BY MOUTH THREE TIMES A DAY 270 tablet 1   diclofenac sodium (VOLTAREN) 1 % GEL Apply 4 g topically 4 (four) times daily. To affected joint. 100 g 11   DULoxetine (CYMBALTA) 60 MG capsule TAKE 2 CAPSULES BY MOUTH EVERY DAY 180 capsule 1   furosemide (LASIX) 40 MG tablet TAKE 1 TABLET BY MOUTH TWICE A DAY 180 tablet 1   methadone (DOLOPHINE) 5 MG tablet Take 2.5 mg by mouth every morning.   0   montelukast (SINGULAIR) 10 MG tablet TAKE 1 TABLET (10 MG TOTAL) BY MOUTH AT BEDTIME. 90 tablet 1   OZEMPIC, 1 MG/DOSE, 2 MG/1.5ML SOPN INJECT 1 MG INTO THE SKIN ONCE A WEEK. 9 pen 0   pramipexole (MIRAPEX) 0.5 MG tablet Take 1 tablet (0.5 mg total) by mouth at bedtime. 90 tablet 3   simvastatin (ZOCOR) 20 MG tablet TAKE 1 TABLET BY MOUTH DAILY. 90 tablet 1   tiZANidine (ZANAFLEX) 4 MG capsule Take 4-8 mg by mouth 2 (two) times daily. ONE TAB AM - PRN AND 2 TABS AT BEDTIME     traMADol (ULTRAM) 50 MG tablet Take 50-100 mg by mouth at bedtime.      No current facility-administered medications for this visit.    Allergies  Allergen Reactions   Penicillins Anaphylaxis    Has patient had a PCN reaction causing immediate rash, facial/tongue/throat swelling, SOB or lightheadedness with hypotension: Yes Has patient had a PCN reaction causing severe rash involving mucus membranes or skin necrosis: No Has patient had a PCN reaction that required hospitalization No Has patient had a PCN reaction occurring within the last 10 years: No If all of the above answers are "NO", then may proceed with Cephalosporin use.    Lactose Nausea And Vomiting and Swelling        Chlorhexidine Rash and Other (See Comments)    Dermatitis - Allergic   Tape Rash    Plastic tape, silicone tape     Discussed warning signs or symptoms. Please see discharge instructions. Patient expresses understanding.

## 2018-12-27 NOTE — Patient Instructions (Signed)
Thank you for coming in today.  Get labs today.   I will get results to you ASAP.   Let me know what the orthopedic doctor says.   Keep me updated.   OK to continue refilling medicines.   Schedule nurse visit for flu vaccine this fall in about  1 month.    I will be moving to full time Sports Medicine in [place] starting on November 1st.  You will still be able to see me for your Sports Medicine or Orthopedic needs in the [facility] Location. I will still be part of [place].    [place] [address] [place], [place] [address]  [phone]  Telephone (phone line will be functional starting in November).  [phone] Fax [phone] Concussion Line  If you want to stay locally for your Sports Medicine issues Dr. Dianah Field here in [place] will be happy to see you.  Additionally Dr. Clearance Coots at [facility] will be happy to see you for sports medicine issues more locally.   For your primary care needs you are welcome to establish care with Dr. Emeterio Reeve.  We are working quickly to hire more physicians to cover the primary care needs however if you cannot get an appointment with Dr. Sheppard Coil in a timely manner [place] has locations and openings for primary care services nearby.   [facility] at [facility] [address] . [place] , [place]: [phone] . Behavioral Medicine: [phone] . Fax: [place] at [employer] [address] . [place], [place]: [phone] . Behavioral Medicine: [phone] . Fax: [phone] . Hours (M-F): 7am - Academic librarian At [facility]. [place], [place]: [phone] . Behavioral Medicine: [phone] . Fax: [phone] . Hours (M-F): 8am - Optician, dispensing at [employer] . [place],  [place] Phone: [phone] . Behavioral Medicine: [phone] . Fax: [phone]

## 2018-12-28 ENCOUNTER — Ambulatory Visit
Admission: RE | Admit: 2018-12-28 | Discharge: 2018-12-28 | Disposition: A | Discharge status: Home / Self Care | Payer: Medicare HMO | Source: Ambulatory Visit | Attending: Student | Admitting: Student

## 2018-12-28 ENCOUNTER — Other Ambulatory Visit: Payer: Self-pay

## 2018-12-28 VITALS — BP 121/79 | HR 71

## 2018-12-28 DIAGNOSIS — M5126 Other intervertebral disc displacement, lumbar region: Secondary | ICD-10-CM | POA: Diagnosis not present

## 2018-12-28 DIAGNOSIS — M5416 Radiculopathy, lumbar region: Secondary | ICD-10-CM

## 2018-12-28 DIAGNOSIS — M4316 Spondylolisthesis, lumbar region: Secondary | ICD-10-CM | POA: Diagnosis not present

## 2018-12-28 DIAGNOSIS — M961 Postlaminectomy syndrome, not elsewhere classified: Secondary | ICD-10-CM

## 2018-12-28 LAB — COMPLETE METABOLIC PANEL WITH GFR
AG Ratio: 2 (calc) (ref 1.0–2.5)
ALT: 12 U/L (ref 6–29)
AST: 17 U/L (ref 10–35)
Albumin: 4.5 g/dL (ref 3.6–5.1)
Alkaline phosphatase (APISO): 91 U/L (ref 37–153)
BUN: 12 mg/dL (ref 7–25)
CO2: 32 mmol/L (ref 20–32)
Calcium: 9.6 mg/dL (ref 8.6–10.4)
Chloride: 100 mmol/L (ref 98–110)
Creat: 0.78 mg/dL (ref 0.50–1.05)
GFR, Est African American: 102 mL/min/{1.73_m2} (ref >=60)
GFR, Est Non African American: 88 mL/min/{1.73_m2} (ref >=60)
Globulin: 2.3 g/dL (calc) (ref 1.9–3.7)
Glucose, Bld: 86 mg/dL (ref 65–99)
Potassium: 3.9 mmol/L (ref 3.5–5.3)
Sodium: 143 mmol/L (ref 135–146)
Total Bilirubin: 0.6 mg/dL (ref 0.2–1.2)
Total Protein: 6.8 g/dL (ref 6.1–8.1)

## 2018-12-28 LAB — CBC
HCT: 44.9 % (ref 35.0–45.0)
Hemoglobin: 14.9 g/dL (ref 11.7–15.5)
MCH: 29.3 pg (ref 27.0–33.0)
MCHC: 33.2 g/dL (ref 32.0–36.0)
MCV: 88.2 fL (ref 80.0–100.0)
MPV: 10.4 fL (ref 7.5–12.5)
Platelets: 258 10*3/uL (ref 140–400)
RBC: 5.09 10*6/uL (ref 3.80–5.10)
RDW: 12.5 % (ref 11.0–15.0)
WBC: 6.8 10*3/uL (ref 3.8–10.8)

## 2018-12-28 LAB — HEMOGLOBIN A1C
Hgb A1c MFr Bld: 5.5 % of total Hgb (ref <5.7)
Mean Plasma Glucose: 111 (calc)
eAG (mmol/L): 6.2 (calc)

## 2018-12-28 LAB — LIPID PANEL W/REFLEX DIRECT LDL
Cholesterol: 205 mg/dL — ABNORMAL HIGH (ref <200)
HDL: 77 mg/dL (ref >=50)
LDL Cholesterol (Calc): 102 mg/dL (calc) — ABNORMAL HIGH
Non-HDL Cholesterol (Calc): 128 mg/dL (calc) (ref <130)
Total CHOL/HDL Ratio: 2.7 (calc) (ref <5.0)
Triglycerides: 165 mg/dL — ABNORMAL HIGH (ref <150)

## 2018-12-28 MED ORDER — IOPAMIDOL (ISOVUE-M 200) INJECTION 41%
20.0000 mL | Freq: Once | INTRAMUSCULAR | Status: AC
  Administered 2018-12-28: 20 mL via INTRATHECAL

## 2018-12-28 MED ORDER — DIAZEPAM 5 MG PO TABS
5.0000 mg | ORAL_TABLET | Freq: Once | ORAL | Status: AC
  Administered 2018-12-28: 5 mg via ORAL

## 2018-12-28 NOTE — Discharge Instructions (Signed)
Myelogram Discharge Instructions  1. Go home and rest quietly for the next 24 hours.  It is important to lie flat for the next 24 hours.  Get up only to go to the restroom.  You may lie in the bed or on a couch on your back, your stomach, your left side or your right side.  You may have one pillow under your head.  You may have pillows between your knees while you are on your side or under your knees while you are on your back.  2. DO NOT drive today.  Recline the seat as far back as it will go, while still wearing your seat belt, on the way home.  3. You may get up to go to the bathroom as needed.  You may sit up for 10 minutes to eat.  You may resume your normal diet and medications unless otherwise indicated.  Drink lots of extra fluids today and tomorrow.  4. The incidence of headache, nausea, or vomiting is about 5% (one in 20 patients).  If you develop a headache, lie flat and drink plenty of fluids until the headache goes away.  Caffeinated beverages may be helpful.  If you develop severe nausea and vomiting or a headache that does not go away with flat bed rest, call [phone].  5. You may resume normal activities after your 24 hours of bed rest is over; however, do not exert yourself strongly or do any heavy lifting tomorrow. If when you get up you have a headache when standing, go back to bed and force fluids for another 24 hours.  6. Call your physician for a follow-up appointment.  The results of your myelogram will be sent directly to your physician by the following day.  7. If you have any questions or if complications develop after you arrive home, please call [phone].  Discharge instructions have been explained to the patient.  The patient, or the person responsible for the patient, fully understands these instructions.  YOU MAY RESTART YOUR BUSPAR, CYMBALTA AND TRAMADOL TOMORROW 12/29/2018 AT 09:30AM.

## 2018-12-29 DIAGNOSIS — M5126 Other intervertebral disc displacement, lumbar region: Secondary | ICD-10-CM | POA: Diagnosis not present

## 2018-12-29 DIAGNOSIS — M5440 Lumbago with sciatica, unspecified side: Secondary | ICD-10-CM | POA: Diagnosis not present

## 2019-01-02 ENCOUNTER — Encounter: Payer: Self-pay | Admitting: Orthopaedic Surgery

## 2019-01-02 ENCOUNTER — Other Ambulatory Visit: Payer: Self-pay | Admitting: Pharmacy Technician

## 2019-01-02 ENCOUNTER — Other Ambulatory Visit: Payer: Self-pay | Admitting: Pharmacist

## 2019-01-02 ENCOUNTER — Ambulatory Visit: Payer: Medicare HMO | Admitting: Sports Medicine

## 2019-01-02 ENCOUNTER — Ambulatory Visit (INDEPENDENT_AMBULATORY_CARE_PROVIDER_SITE_OTHER): Payer: Medicare HMO | Admitting: Orthopaedic Surgery

## 2019-01-02 DIAGNOSIS — G8929 Other chronic pain: Secondary | ICD-10-CM

## 2019-01-02 DIAGNOSIS — M94262 Chondromalacia, left knee: Secondary | ICD-10-CM

## 2019-01-02 DIAGNOSIS — M25562 Pain in left knee: Secondary | ICD-10-CM | POA: Diagnosis not present

## 2019-01-02 NOTE — Patient Outreach (Signed)
[facility]) Care Management  01/02/2019  Katrina Fernandez May 01, 1966 [id number]  Patient's case is being closed as she received Ozempic from [place]. She communicated to [employer], CPhT that she understood how to place a refill.  Plan: Close patient's case. Send closure letters to patient and her provider.  Elayne Guerin, PharmD, [facility] Clinical Pharmacist [phone]

## 2019-01-02 NOTE — Patient Outreach (Signed)
[facility]) Care Management  01/02/2019  Katrina Fernandez [birth year] [id number]  Successful outreach call placed to patient in regards to [employer] application for [place].  Spoke to patient, HIPAA identifiers verified.  Patient informed she had received 5 boxes of Ozempic. Discussed refill procedure with patient and informed that refills have to be placed by the prescribing doctor to [employer]. Patient verbalized understanding.  Patient informed she had no other questions or concerns at this time as it relates to patient assistance and Ozempic.  Patient confirmed having name and number available.  Will route note to [place] that patient assistance has been completed and will remove myself from care team.  Katrina Fernandez. Katrina Fernandez, [facility] Management [phone]

## 2019-01-02 NOTE — Progress Notes (Signed)
Office Visit Note   Patient: Katrina Fernandez           Date of Birth: 01/02/1966           MRN: [id number] Visit Date: 01/02/2019              Requested by: Gregor Hams, MD [address] [place],   [id number] PCP: Gregor Hams, MD   Assessment & Plan: Visit Diagnoses:  1. Chronic pain of left knee   2. Chondromalacia, knee, left     Plan: I do feel is worth trying a hyaluronic acid injection in her left knee given the failure of other conservative treatment measures and given the finding of CT scan showing chondromalacia and thinning of the medial compartment.  She has most painful in the side and I feel that this type of injection may be worthwhile for her.  She agrees with trying to stay conservative and trying this route before considering an arthroscopic intervention.  All question concerns were answered addressed.  We will work on the hyaluronic acid being ordered.  Failure to relieve in the steroid with her knee.  Follow-Up Instructions: Return in about 4 weeks (around 01/30/2019).   Orders:  No orders of the defined types were placed in this encounter.  No orders of the defined types were placed in this encounter.     Procedures: No procedures performed   Clinical Data: No additional findings.   Subjective: Chief Complaint  Patient presents with   Left Knee - Pain  The patient comes in for evaluation treatment of severe left knee pain is been hurting her since about March.  She was in the gym and she felt and heard a pop in her knee and she has medial joint line tenderness and pain since then.  She is been wearing a hinged knee brace.  She is someone who does have a complicated spine history and also has a shunt as well as a spinal cord stimulator.  She cannot have an MRI of her knee but a CT arthrogram was performed that was negative other than showing some chondromalacia of the medial compartment of the knee.  She continues to have severe  pain with that knee.  She is does wear knee brace for support.  She has had a steroid injection in that knee a month ago which only helped maybe for a couple days when the knee was numb.  She has not injured this knee before.  HPI  Review of Systems She does report knee swelling.  She denies any headache, chest pain, shortness of breath, fever, chills, nausea, vomiting  Objective: Vital Signs: There were no vitals taken for this visit.  Physical Exam She is alert and orient x3 and in no acute distress Ortho Exam Examination of both knees together does show just a slight size difference on her right thigh area near the knee comparing right and left sides.  Her extensor mechanism is intact.  She has medial joint line tenderness which is quite severe and pain along the medial collateral ligament.  Her knee range of motion is full and her knee is ligamentously stable.  The knee is cool and there is no joint effusion. Specialty Comments:  No specialty comments available.  Imaging: No results found.  Independent review of the CT scan of her left knee shows no meniscal tear and no fracture.  There is thinning of the articular cartilage. PMFS History: Patient Active Problem  List   Diagnosis Date Noted   Cubital tunnel syndrome on left 03/17/2018   Plantar fasciitis of left foot 03/17/2018   Irritability 01/14/2018   CSF leak 06/23/2017   Trochanteric bursitis, left hip 06/09/2017   Pseudotumor cerebri 03/31/2017   Obesity 09/15/2016   Abnormal weight gain 09/03/2016   Chiari I malformation ([place]) 07/09/2016   Prediabetes 02/21/2016   Spinal cord stimulator status 11/01/2015   RLS (restless legs syndrome) 10/23/2015   Seasonal allergies 10/23/2015   Hyperlipidemia 07/17/2015   Osteoarthritis of carpometacarpal joint of left thumb 06/28/2015   Fibromyalgia 05/30/2015   Chronic back pain 05/09/2015   Therapeutic opioid-induced constipation (OIC) 01/31/2015   Insomnia  07/09/2014   Generalized anxiety disorder 04/03/2014   Spider angioma 10/11/2013   Chronic venous insufficiency 09/25/2013   Symptomatic mammary hypertrophy 05/30/2013   Nevus, non-neoplastic 08/01/2012   Pain syndrome, chronic 06/20/2012   Postlaminectomy syndrome of lumbar region 06/20/2012   Primary osteoarthritis of both knees 02/08/2012   MDD (major depressive disorder) 01/28/2012   Subluxation of peroneal tendon of right foot 01/28/2012   GERD (gastroesophageal reflux disease) 60/73/7106   Complication of gastric band procedure 08/26/2011   History of laparoscopic adjustable gastric banding 09/23/2010   Past Medical History:  Diagnosis Date   Anemia    Anxiety    Arthritis    "back and knees" (07/23/2016)   Chiari malformation type I ([place]) dx'd 2014   Chronic back pain    buldging, DDD; "neck and lower back" (07/23/2016)   Chronic constipation    takes Miralax daily   Chronic pain syndrome    takes Methadone and Keppra daily   Depression    takes Effexor daily   Endometriosis    1995   Fibromyalgia    GERD (gastroesophageal reflux disease)    not taking anything at the present time   History of DVT of lower extremity 07/2012   LEFT LEG --  3/ 2014.Was on a blood transfusion for 6 months   History of kidney stones    History of MRSA infection 10 yrs ago   Hyperlipidemia    takes Simvastatin daily   Migraine    "daily recently" (07/23/2016)   Muscle spasm    takes Zanaflex nightly   Peripheral edema    takes Lasix daily as needed   Peripheral neuropathy    Pneumonia    hx of-7-8 yrs ago   PONV (postoperative nausea and vomiting)    Seasonal allergies    takes Claritin daily   Spinal headache    "since 2002; still get them; try to manage it w/lying flat"   Weakness    and numbness in right hand    Family History  Problem Relation Age of Onset   Depression Other    Colon cancer Father    Ovarian cancer Maternal  Grandmother    Pancreatic cancer Maternal Grandfather     Past Surgical History:  Procedure Laterality Date   ANTERIOR CERVICAL DECOMP/DISCECTOMY FUSION  07-25-2009   C5 -- C6   APPENDECTOMY  age 50   APPLICATION OF CRANIAL NAVIGATION N/A 06/23/2017   Procedure: APPLICATION OF CRANIAL NAVIGATION;  Surgeon: Kary Kos, MD;  Location: [facility];  Service: Neurosurgery;  Laterality: N/A;   BACK SURGERY     BREAST REDUCTION SURGERY Bilateral 06/07/2013   Procedure: BILATERAL MAMMARY REDUCTION  (BREAST);  Surgeon: Theodoro Kos, DO;  Location: [facility];  Service: Plastics;  Laterality: Bilateral;   BREAST REDUCTION SURGERY  Bilateral 06/07/2013   Procedure:  LIPOSUCTION;  Surgeon: Theodoro Kos, DO;  Location: [facility];  Service: Plastics;  Laterality: Bilateral;   CARPAL TUNNEL RELEASE Bilateral 2001   COLONOSCOPY  X 2   "polyps were benign"   DILATION AND CURETTAGE OF UTERUS     ESOPHAGOGASTRODUODENOSCOPY     gastric banding undone  11/2014   LAPAROSCOPIC CHOLECYSTECTOMY  ~ 2000   LAPAROSCOPIC GASTRIC BANDING  2011   LAPAROSCOPIC REVISION VENTRICULAR-PERITONEAL (V-P) SHUNT N/A 06/23/2017   Procedure: Shunt Placment stereotactic ventricular catheter placement and general surgery abdominal exposure  (Laparoscopic vs. Open) Brain Lab;  Surgeon: Clovis Riley, MD;  Location: [facility];  Service: General;  Laterality: N/A;   LAPAROSCOPY N/A 06/23/2017   Procedure: LAPAROSCOPY DIAGNOSTIC;  Surgeon: Clovis Riley, MD;  Location: [facility];  Service: General;  Laterality: N/A;   LUMBAR LAMINECTOMY/DECOMPRESSION MICRODISCECTOMY N/A 07/23/2016   Procedure: Decompressive Lumbar Laminectomy Lumbar two-three  for Repair of  Cerberal Spinal Fluid  Leak;  Surgeon: Kary Kos, MD;  Location: [place];  Service: Neurosurgery;  Laterality: N/A;   PLACEMENT LUMBOPERITONEAL SHUNT FOR CEREBROSPINAL FLUID DIVERSION AWAY FROM C8 PERINEURAL CYST  09-05-2003  &  12-24-2003    08-31-2003  REMOVAL SHUNT   POSTERIOR CERVICAL LAMINECTOMY  03-01-2001   C7-8 and Exploration C8 perineural cyst   POSTERIOR LUMBAR LAMINECTOMY L4-5/  DISKECTOMY L5-S1  05-20-2005   RE-DO DECOMPRESSION LAMINECTOMY AND FUSION L4-5  &  L5-S1  12-20-2006   02-04-2007--  RE-EXPLORATION L4 to S1, I & D LUMBAR WOUND HEMATOMA   REDUCTION MAMMAPLASTY     SHUNT REMOVAL N/A 07/10/2016   Procedure: SHUNT REMOVAL;  Surgeon: Kary Kos, MD;  Location: [place];  Service: Neurosurgery;  Laterality: N/A;  SHUNT REMOVAL   SPINAL CORD STIMULATOR INSERTION N/A 04/26/2015   Procedure: LUMBAR SPINAL CORD STIMULATOR INSERTION;  Surgeon: Clydell Hakim, MD;  Location: [facility];  Service: Neurosurgery;  Laterality: N/A;  LUMBAR SPINAL CORD STIMULATOR INSERTION   TENNIS ELBOW RELEASE/NIRSCHEL PROCEDURE Left 12/13/2014   Procedure: LEFT ELBOW LATERAL EPICONDYLAR DEBRIDEMENT AND OR REPAIR -RECONSTRUCTION ;  Surgeon: Iran Planas, MD;  Location: [facility];  Service: Orthopedics;  Laterality: Left;   TOTAL ABDOMINAL HYSTERECTOMY  1994   w/  Bilateral Salpinoophorectomy   VENTRICULOPERITONEAL SHUNT N/A 06/23/2017   Procedure: VENTRICULAR-PERITONEAL SHUNT INSERTION WITH ABDOMINAL LAPARASCOPY AND BRAINLAB NAVIGATION;  Surgeon: Kary Kos, MD;  Location: [facility];  Service: Neurosurgery;  Laterality: N/A;   Social History   Occupational History   Occupation: Disabled   Tobacco Use   Smoking status: Never Smoker   Smokeless tobacco: Never Used  Substance and Sexual Activity   Alcohol use: No   Drug use: No   Sexual activity: Not Currently    Birth control/protection: Surgical

## 2019-01-03 ENCOUNTER — Ambulatory Visit (INDEPENDENT_AMBULATORY_CARE_PROVIDER_SITE_OTHER): Payer: Medicare HMO | Admitting: Psychology

## 2019-01-03 ENCOUNTER — Telehealth: Payer: Self-pay

## 2019-01-03 DIAGNOSIS — F411 Generalized anxiety disorder: Secondary | ICD-10-CM

## 2019-01-03 DIAGNOSIS — R69 Illness, unspecified: Secondary | ICD-10-CM | POA: Diagnosis not present

## 2019-01-03 DIAGNOSIS — F4323 Adjustment disorder with mixed anxiety and depressed mood: Secondary | ICD-10-CM

## 2019-01-03 NOTE — Telephone Encounter (Signed)
Left knee gel injection ?

## 2019-01-10 DIAGNOSIS — R69 Illness, unspecified: Secondary | ICD-10-CM | POA: Diagnosis not present

## 2019-01-10 NOTE — Telephone Encounter (Signed)
Noted  

## 2019-01-13 ENCOUNTER — Telehealth: Payer: Self-pay

## 2019-01-13 NOTE — Telephone Encounter (Signed)
Submitted VOB for Gel-One, left knee. 

## 2019-01-17 ENCOUNTER — Other Ambulatory Visit: Payer: Self-pay | Admitting: Family Medicine

## 2019-01-17 ENCOUNTER — Ambulatory Visit: Payer: Medicare HMO | Admitting: Psychology

## 2019-01-18 ENCOUNTER — Ambulatory Visit: Payer: Self-pay | Admitting: Pharmacist

## 2019-01-19 DIAGNOSIS — M5416 Radiculopathy, lumbar region: Secondary | ICD-10-CM | POA: Diagnosis not present

## 2019-01-19 DIAGNOSIS — R030 Elevated blood-pressure reading, without diagnosis of hypertension: Secondary | ICD-10-CM | POA: Diagnosis not present

## 2019-01-19 DIAGNOSIS — Z6831 Body mass index (BMI) 31.0-31.9, adult: Secondary | ICD-10-CM | POA: Diagnosis not present

## 2019-01-19 DIAGNOSIS — M5126 Other intervertebral disc displacement, lumbar region: Secondary | ICD-10-CM | POA: Diagnosis not present

## 2019-01-20 ENCOUNTER — Telehealth: Payer: Self-pay

## 2019-01-20 NOTE — Telephone Encounter (Signed)
Approved for Gel-One, left knee Buy & Bill Patient will be responsible for 20% OOP. Co-pay of $25.00 PA required PA Approval#[id number] Valid 01/16/2019- 04/17/2019  Appt. 01/30/2019 with Dr. Ninfa Linden

## 2019-01-30 ENCOUNTER — Ambulatory Visit (INDEPENDENT_AMBULATORY_CARE_PROVIDER_SITE_OTHER): Payer: Medicare HMO | Admitting: Orthopaedic Surgery

## 2019-01-30 ENCOUNTER — Encounter: Payer: Self-pay | Admitting: Orthopaedic Surgery

## 2019-01-30 DIAGNOSIS — M1712 Unilateral primary osteoarthritis, left knee: Secondary | ICD-10-CM | POA: Diagnosis not present

## 2019-01-30 MED ORDER — CROSS-LINK HYAL ACID (VISC) 30 MG/3ML IX PRSY
30.0000 mg | PREFILLED_SYRINGE | INTRA_ARTICULAR | Status: AC | PRN
  Administered 2019-01-30: 30 mg via INTRA_ARTICULAR

## 2019-01-30 NOTE — Progress Notes (Signed)
   Procedure Note  Patient: Katrina Fernandez             Date of Birth: Jul 30, 1965           MRN: [id number]             Visit Date: 01/30/2019  Procedures: Visit Diagnoses:  1. Unilateral primary osteoarthritis, left knee     Large Joint Inj: L knee on 01/30/2019 2:47 PM Indications: diagnostic evaluation and pain Details: 22 G 1.5 in needle, superolateral approach  Arthrogram: No  Medications: 30 mg Cross-Linked Hyaluronate 30 MG/3ML Outcome: tolerated well, no immediate complications Procedure, treatment alternatives, risks and benefits explained, specific risks discussed. Consent was given by the patient. Immediately prior to procedure a time out was called to verify the correct patient, procedure, equipment, support staff and site/side marked as required. Patient was prepped and draped in the usual sterile fashion.    The patient is here today for scheduled hyaluronic acid injection into her left knee.  This is hopefully going to treat some of the pain that she has from some arthritis in her knee.  The other option would be considering an arthroscopic intervention versus a knee replacement but given her clinical exam findings we would recommend an arthroscopic intervention if this does not help.  She understands fully why she is having this as well as understanding the risk and benefits involved.  She is failed a steroid injection and does break out with those injections as well.  We will be placing Gel 1 into her knee today.  She tolerated injection well.  All question concerns were answered and addressed.  Follow-up will be as needed.

## 2019-01-31 ENCOUNTER — Ambulatory Visit (INDEPENDENT_AMBULATORY_CARE_PROVIDER_SITE_OTHER): Payer: Medicare HMO | Admitting: Psychology

## 2019-01-31 DIAGNOSIS — F4323 Adjustment disorder with mixed anxiety and depressed mood: Secondary | ICD-10-CM | POA: Diagnosis not present

## 2019-01-31 DIAGNOSIS — R69 Illness, unspecified: Secondary | ICD-10-CM | POA: Diagnosis not present

## 2019-01-31 DIAGNOSIS — F411 Generalized anxiety disorder: Secondary | ICD-10-CM | POA: Diagnosis not present

## 2019-02-09 ENCOUNTER — Other Ambulatory Visit: Payer: Self-pay | Admitting: Neurosurgery

## 2019-02-09 ENCOUNTER — Encounter: Payer: Self-pay | Admitting: Physician Assistant

## 2019-02-09 ENCOUNTER — Ambulatory Visit (INDEPENDENT_AMBULATORY_CARE_PROVIDER_SITE_OTHER): Payer: Medicare HMO | Admitting: Physician Assistant

## 2019-02-09 DIAGNOSIS — M5126 Other intervertebral disc displacement, lumbar region: Secondary | ICD-10-CM | POA: Diagnosis not present

## 2019-02-09 DIAGNOSIS — M1712 Unilateral primary osteoarthritis, left knee: Secondary | ICD-10-CM

## 2019-02-09 NOTE — Progress Notes (Signed)
HPI: Katrina Fernandez comes in today status post left knee Orthovisc injection 01/30/2019 states she still having pain in the knee.  Knee pain is worse than prior to the injection.  Physical exam left knee no abnormal warmth erythema or effusion.  She is overall good range of motion of the knee.  Impression: Chondromalacia patella and thinning medial compartment left knee  Plan advised patient that if she needs to give this little more time it can take 4 to 8 weeks for the supplemental injection to help with the knee pain.  In the interim continue to work on quad strengthening.  Discussed natural anti-inflammatories with her.  She will follow-up with [place] if her pain persist or becomes worse.

## 2019-02-14 ENCOUNTER — Ambulatory Visit (INDEPENDENT_AMBULATORY_CARE_PROVIDER_SITE_OTHER): Payer: Medicare HMO | Admitting: Psychology

## 2019-02-14 DIAGNOSIS — F4323 Adjustment disorder with mixed anxiety and depressed mood: Secondary | ICD-10-CM

## 2019-02-14 DIAGNOSIS — R69 Illness, unspecified: Secondary | ICD-10-CM | POA: Diagnosis not present

## 2019-02-14 DIAGNOSIS — F411 Generalized anxiety disorder: Secondary | ICD-10-CM | POA: Diagnosis not present

## 2019-02-16 DIAGNOSIS — M961 Postlaminectomy syndrome, not elsewhere classified: Secondary | ICD-10-CM | POA: Diagnosis not present

## 2019-02-17 ENCOUNTER — Other Ambulatory Visit: Payer: Self-pay | Admitting: Family Medicine

## 2019-02-24 NOTE — Progress Notes (Addendum)
[facility], [place] - [address] [place] [address] Phone: [phone] Fax: [phone]      Your procedure is scheduled on Wednesday, October 14th.  Report to [facility] Main Entrance "A" at [place].M., and check in at the Admitting office.  Call this number if you have problems the morning of surgery:  [phone]  Call [phone] if you have any questions prior to your surgery date Monday-Friday 8am-4pm    Remember:  Do not eat or drink after midnight the night before your surgery    Take these medicines the morning of surgery with A SIP OF WATER    DULoxetine (CYMBALTA)  IF NEEDED:  busPIRone (BUSPAR)   traMADol (ULTRAM)   As of today, STOP taking any Aspirin (unless otherwise instructed by your surgeon), NSAIDs, such as celecoxib (CELEBREX), diclofenac sodium (VOLTAREN) GEL, Aleve, Naproxen, Ibuprofen, Motrin, Advil, Goody's, BC's, all herbal medications, fish oil, and all vitamins.   WHAT DO I DO ABOUT MY DIABETES MEDICATION?  The day of surgery, do not take other diabetes injectables, including OZEMPIC.   How to Manage Your Diabetes Before and After Surgery  Why is it important to control my blood sugar before and after surgery? . Improving blood sugar levels before and after surgery helps healing and can limit problems. . A way of improving blood sugar control is eating a healthy diet by: o  Eating less sugar and carbohydrates o  Increasing activity/exercise o  Talking with your doctor about reaching your blood sugar goals . High blood sugars (greater than 180 mg/dL) can raise your risk of infections and slow your recovery, so you will need to focus on controlling your diabetes during the weeks before surgery. . Make sure that the doctor who takes care of your diabetes knows about your planned surgery including the date and location.  How do I manage my blood sugar before surgery? . Check your blood sugar at  least 4 times a day, starting 2 days before surgery, to make sure that the level is not too high or low. o Check your blood sugar the morning of your surgery when you wake up and every 2 hours until you get to the Short Stay unit. . If your blood sugar is less than 70 mg/dL, you will need to treat for low blood sugar: o Do not take insulin. o Treat a low blood sugar (less than 70 mg/dL) with  cup of clear juice (cranberry or apple), 4 glucose tablets, OR glucose gel. o Recheck blood sugar in 15 minutes after treatment (to make sure it is greater than 70 mg/dL). If your blood sugar is not greater than 70 mg/dL on recheck, call [phone] for further instructions. . Report your blood sugar to the short stay nurse when you get to Short Stay.  . If you are admitted to the hospital after surgery: o Your blood sugar will be checked by the staff and you will probably be given insulin after surgery (instead of oral diabetes medicines) to make sure you have good blood sugar levels. o The goal for blood sugar control after surgery is 80-180 mg/dL.      The Morning of Surgery  Do not wear jewelry, make-up or nail polish.  Do not wear lotions, powders, or perfumes, or deodorant  Do not shave 48 hours prior to surgery.    Do not bring valuables to the hospital.  [facility] is not responsible for  any belongings or valuables.  If you are a smoker, DO NOT Smoke 24 hours prior to surgery IF you wear a CPAP at night please bring your mask, tubing, and machine the morning of surgery   Remember that you must have someone to transport you home after your surgery, and remain with you for 24 hours if you are discharged the same day.   Contacts, glasses, hearing aids, dentures or bridgework may not be worn into surgery.    Leave your suitcase in the car.  After surgery it may be brought to your room.  For patients admitted to the hospital, discharge time will be determined by your treatment  team.  Patients discharged the day of surgery will not be allowed to drive home.    Special instructions:   [facility]- Preparing For Surgery  Before surgery, you can play an important role. Because skin is not sterile, your skin needs to be as free of germs as possible. You can reduce the number of germs on your skin by washing with CHG (chlorahexidine gluconate) Soap before surgery.  CHG is an antiseptic cleaner which kills germs and bonds with the skin to continue killing germs even after washing.    Oral Hygiene is also important to reduce your risk of infection.  Remember - BRUSH YOUR TEETH THE MORNING OF SURGERY WITH YOUR REGULAR TOOTHPASTE  Please do not use if you have an allergy to CHG or antibacterial soaps. If your skin becomes reddened/irritated stop using the CHG.  Do not shave (including legs and underarms) for at least 48 hours prior to first CHG shower. It is OK to shave your face.  Please follow these instructions carefully.   1. Shower the NIGHT BEFORE SURGERY and the MORNING OF SURGERY with CHG Soap.   2. If you chose to wash your hair, wash your hair first as usual with your normal shampoo.  3. After you shampoo, rinse your hair and body thoroughly to remove the shampoo.  4. Use CHG as you would any other liquid soap. You can apply CHG directly to the skin and wash gently with a scrungie or a clean washcloth.   5. Apply the CHG Soap to your body ONLY FROM THE NECK DOWN.  Do not use on open wounds or open sores. Avoid contact with your eyes, ears, mouth and genitals (private parts). Wash Face and genitals (private parts)  with your normal soap.   6. Wash thoroughly, paying special attention to the area where your surgery will be performed.  7. Thoroughly rinse your body with warm water from the neck down.  8. DO NOT shower/wash with your normal soap after using and rinsing off the CHG Soap.  9. Pat yourself dry with a CLEAN TOWEL.  10. Wear CLEAN PAJAMAS to bed  the night before surgery, wear comfortable clothes the morning of surgery  11. Place CLEAN SHEETS on your bed the night of your first shower and DO NOT SLEEP WITH PETS.    Day of Surgery:  Do not apply any deodorants/lotions. Please shower the morning of surgery with the CHG soap  Please wear clean clothes to the hospital/surgery center.   Remember to brush your teeth WITH YOUR REGULAR TOOTHPASTE.   Please read over the following fact sheets that you were given.

## 2019-02-27 ENCOUNTER — Encounter (HOSPITAL_COMMUNITY): Payer: Self-pay

## 2019-02-27 ENCOUNTER — Other Ambulatory Visit: Payer: Self-pay

## 2019-02-27 ENCOUNTER — Other Ambulatory Visit (HOSPITAL_COMMUNITY)
Admission: RE | Admit: 2019-02-27 | Discharge: 2019-02-27 | Disposition: A | Discharge status: Home / Self Care | Payer: Medicare HMO | Source: Ambulatory Visit | Attending: Neurosurgery | Admitting: Neurosurgery

## 2019-02-27 ENCOUNTER — Encounter (HOSPITAL_COMMUNITY)
Admission: RE | Admit: 2019-02-27 | Discharge: 2019-02-27 | Disposition: A | Discharge status: Home / Self Care | Payer: Medicare HMO | Source: Ambulatory Visit | Attending: Neurosurgery | Admitting: Neurosurgery

## 2019-02-27 DIAGNOSIS — Z01812 Encounter for preprocedural laboratory examination: Secondary | ICD-10-CM | POA: Diagnosis present

## 2019-02-27 DIAGNOSIS — Z20828 Contact with and (suspected) exposure to other viral communicable diseases: Secondary | ICD-10-CM | POA: Diagnosis not present

## 2019-02-27 LAB — SURGICAL PCR SCREEN
MRSA, PCR: NEGATIVE
Staphylococcus aureus: NEGATIVE

## 2019-02-27 LAB — BASIC METABOLIC PANEL
Anion gap: 10 (ref 5–15)
BUN: 11 mg/dL (ref 6–20)
CO2: 31 mmol/L (ref 22–32)
Calcium: 9.5 mg/dL (ref 8.9–10.3)
Chloride: 101 mmol/L (ref 98–111)
Creatinine, Ser: 0.81 mg/dL (ref 0.44–1.00)
GFR calc Af Amer: >60 mL/min (ref >60)
GFR calc non Af Amer: >60 mL/min (ref >60)
Glucose, Bld: 74 mg/dL (ref 70–99)
Potassium: 3.8 mmol/L (ref 3.5–5.1)
Sodium: 142 mmol/L (ref 135–145)

## 2019-02-27 LAB — HEMOGLOBIN A1C
Hgb A1c MFr Bld: 5.2 % (ref 4.8–5.6)
Mean Plasma Glucose: 102.54 mg/dL

## 2019-02-27 LAB — CBC
HCT: 44.7 % (ref 36.0–46.0)
Hemoglobin: 14.8 g/dL (ref 12.0–15.0)
MCH: 29.6 pg (ref 26.0–34.0)
MCHC: 33.1 g/dL (ref 30.0–36.0)
MCV: 89.4 fL (ref 80.0–100.0)
Platelets: 274 10*3/uL (ref 150–400)
RBC: 5 MIL/uL (ref 3.87–5.11)
RDW: 12.1 % (ref 11.5–15.5)
WBC: 7.9 10*3/uL (ref 4.0–10.5)
nRBC: 0 % (ref 0.0–0.2)

## 2019-02-27 LAB — SARS CORONAVIRUS 2 (TAT 6-24 HRS): SARS Coronavirus 2: NEGATIVE

## 2019-02-27 LAB — GLUCOSE, CAPILLARY: Glucose-Capillary: 104 mg/dL — ABNORMAL HIGH (ref 70–99)

## 2019-02-27 NOTE — Progress Notes (Signed)
Patient states she is pre-diabetic and takes Ozempic injections once weekly.  She does not know her fasting blood sugar nor does she check her blood sugars at home. Last Hgb A1C 5.5 on 12/27/2018.  HgbA1C redrawn today in PAT.

## 2019-02-27 NOTE — Progress Notes (Signed)
PCP - Dr. Lynne Leader [facility]) Cardiologist - denies  PPM/ICD - denies  Chest x-ray - N/A EKG - N/A Stress Test - denies ECHO - denies Cardiac Cath - denies  Sleep Study - denies CPAP - N/A  Blood Thinner Instructions: N/A Aspirin Instructions: N/A  COVID TEST- scheduled 02/27/2019  Coronavirus Screening  Have you experienced the following symptoms:  Cough yes/no: No Fever (>100.88F)  yes/no: No Runny nose yes/no: No Sore throat yes/no: No Difficulty breathing/shortness of breath  yes/no: No  Have you or a family member traveled in the last 14 days and where? yes/no: No   If the patient indicates "YES" to the above questions, their PAT will be rescheduled to limit the exposure to others and, the surgeon will be notified. THE PATIENT WILL NEED TO BE ASYMPTOMATIC FOR 14 DAYS.   If the patient is not experiencing any of these symptoms, the PAT nurse will instruct them to NOT bring anyone with them to their appointment since they may have these symptoms or traveled as well.   Please remind your patients and families that hospital visitation restrictions are in effect and the importance of the restrictions.    Anesthesia review: Yes - prior problems w/anesthesis - spinal headache, post-op n/v  Patient denies shortness of breath, fever, cough and chest pain at PAT appointment   Patient verbalized understanding of instructions that were given to them at the PAT appointment. Patient was also instructed that they will need to review over the PAT instructions again at home before surgery.

## 2019-02-28 ENCOUNTER — Ambulatory Visit (INDEPENDENT_AMBULATORY_CARE_PROVIDER_SITE_OTHER): Payer: Medicare HMO | Admitting: Psychology

## 2019-02-28 DIAGNOSIS — R69 Illness, unspecified: Secondary | ICD-10-CM | POA: Diagnosis not present

## 2019-02-28 DIAGNOSIS — F411 Generalized anxiety disorder: Secondary | ICD-10-CM | POA: Diagnosis not present

## 2019-02-28 DIAGNOSIS — F4323 Adjustment disorder with mixed anxiety and depressed mood: Secondary | ICD-10-CM

## 2019-02-28 NOTE — Anesthesia Preprocedure Evaluation (Addendum)
Anesthesia Evaluation  Patient identified by MRN, date of birth, ID band Patient awake    Reviewed: Allergy & Precautions, NPO status , Patient's Chart, lab work & pertinent test results  History of Anesthesia Complications (+) PONV, POST - OP SPINAL HEADACHE and history of anesthetic complications  Airway Mallampati: II  TM Distance: >3 FB Neck ROM: Full    Dental  (+) Teeth Intact, Dental Advisory Given, Chipped   Pulmonary neg pulmonary ROS,    Pulmonary exam normal breath sounds clear to auscultation       Cardiovascular + DVT  Normal cardiovascular exam Rhythm:Regular Rate:Normal     Neuro/Psych  Headaches, PSYCHIATRIC DISORDERS Anxiety Depression Chiari malformation type I  Neuromuscular disease    GI/Hepatic Neg liver ROS, GERD  ,  Endo/Other  Obesity   Renal/GU negative Renal ROS     Musculoskeletal  (+) Arthritis , Fibromyalgia -, narcotic dependent  Abdominal   Peds  Hematology negative hematology ROS (+)   Anesthesia Other Findings Day of surgery medications reviewed with the patient.  Reproductive/Obstetrics                             Anesthesia Physical Anesthesia Plan  ASA: III  Anesthesia Plan: General   Post-op Pain Management:    Induction: Intravenous  PONV Risk Score and Plan: 4 or greater and Diphenhydramine, Scopolamine patch - Pre-op, Midazolam, Dexamethasone, Ondansetron and TIVA  Airway Management Planned: Oral ETT  Additional Equipment:   Intra-op Plan:   Post-operative Plan: Extubation in OR  Informed Consent: I have reviewed the patients History and Physical, chart, labs and discussed the procedure including the risks, benefits and alternatives for the proposed anesthesia with the patient or authorized representative who has indicated his/her understanding and acceptance.     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 She also has a spinal cord stimulator in place for treatment of chronic pain.   2nd PIV)     Anesthesia Quick Evaluation

## 2019-03-01 ENCOUNTER — Ambulatory Visit (HOSPITAL_COMMUNITY)
Admission: RE | Admit: 2019-03-01 | Discharge: 2019-03-01 | Disposition: A | Discharge status: Home / Self Care | Payer: Medicare HMO | Attending: Neurosurgery | Admitting: Neurosurgery

## 2019-03-01 ENCOUNTER — Ambulatory Visit (HOSPITAL_COMMUNITY): Payer: Medicare HMO | Admitting: Certified Registered Nurse Anesthetist

## 2019-03-01 ENCOUNTER — Other Ambulatory Visit: Payer: Self-pay

## 2019-03-01 ENCOUNTER — Encounter (HOSPITAL_COMMUNITY): Payer: Self-pay | Admitting: *Deleted

## 2019-03-01 ENCOUNTER — Encounter (HOSPITAL_COMMUNITY)
Admission: RE | Disposition: A | Discharge status: Home / Self Care | Payer: Self-pay | Source: Home / Self Care | Attending: Neurosurgery

## 2019-03-01 ENCOUNTER — Ambulatory Visit (HOSPITAL_COMMUNITY): Payer: Medicare HMO

## 2019-03-01 ENCOUNTER — Ambulatory Visit (HOSPITAL_COMMUNITY): Payer: Medicare HMO | Admitting: Physician Assistant

## 2019-03-01 DIAGNOSIS — Z981 Arthrodesis status: Secondary | ICD-10-CM | POA: Diagnosis not present

## 2019-03-01 DIAGNOSIS — Z79899 Other long term (current) drug therapy: Secondary | ICD-10-CM | POA: Diagnosis not present

## 2019-03-01 DIAGNOSIS — Z791 Long term (current) use of non-steroidal anti-inflammatories (NSAID): Secondary | ICD-10-CM | POA: Insufficient documentation

## 2019-03-01 DIAGNOSIS — F419 Anxiety disorder, unspecified: Secondary | ICD-10-CM | POA: Diagnosis not present

## 2019-03-01 DIAGNOSIS — G894 Chronic pain syndrome: Secondary | ICD-10-CM | POA: Diagnosis not present

## 2019-03-01 DIAGNOSIS — M479 Spondylosis, unspecified: Secondary | ICD-10-CM | POA: Diagnosis not present

## 2019-03-01 DIAGNOSIS — M170 Bilateral primary osteoarthritis of knee: Secondary | ICD-10-CM | POA: Diagnosis not present

## 2019-03-01 DIAGNOSIS — M5116 Intervertebral disc disorders with radiculopathy, lumbar region: Secondary | ICD-10-CM | POA: Insufficient documentation

## 2019-03-01 DIAGNOSIS — E785 Hyperlipidemia, unspecified: Secondary | ICD-10-CM | POA: Diagnosis not present

## 2019-03-01 DIAGNOSIS — F329 Major depressive disorder, single episode, unspecified: Secondary | ICD-10-CM | POA: Diagnosis not present

## 2019-03-01 DIAGNOSIS — Z87442 Personal history of urinary calculi: Secondary | ICD-10-CM | POA: Diagnosis not present

## 2019-03-01 DIAGNOSIS — M5126 Other intervertebral disc displacement, lumbar region: Secondary | ICD-10-CM | POA: Diagnosis present

## 2019-03-01 DIAGNOSIS — M797 Fibromyalgia: Secondary | ICD-10-CM | POA: Diagnosis not present

## 2019-03-01 DIAGNOSIS — Z888 Allergy status to other drugs, medicaments and biological substances status: Secondary | ICD-10-CM | POA: Insufficient documentation

## 2019-03-01 DIAGNOSIS — Z86718 Personal history of other venous thrombosis and embolism: Secondary | ICD-10-CM | POA: Diagnosis not present

## 2019-03-01 DIAGNOSIS — Z87892 Personal history of anaphylaxis: Secondary | ICD-10-CM | POA: Insufficient documentation

## 2019-03-01 DIAGNOSIS — Z419 Encounter for procedure for purposes other than remedying health state, unspecified: Secondary | ICD-10-CM

## 2019-03-01 DIAGNOSIS — Z8614 Personal history of Methicillin resistant Staphylococcus aureus infection: Secondary | ICD-10-CM | POA: Insufficient documentation

## 2019-03-01 DIAGNOSIS — R69 Illness, unspecified: Secondary | ICD-10-CM | POA: Diagnosis not present

## 2019-03-01 DIAGNOSIS — Z982 Presence of cerebrospinal fluid drainage device: Secondary | ICD-10-CM | POA: Insufficient documentation

## 2019-03-01 DIAGNOSIS — M961 Postlaminectomy syndrome, not elsewhere classified: Secondary | ICD-10-CM | POA: Diagnosis not present

## 2019-03-01 HISTORY — PX: LUMBAR LAMINECTOMY/DECOMPRESSION MICRODISCECTOMY: SHX5026

## 2019-03-01 LAB — GLUCOSE, CAPILLARY: Glucose-Capillary: 93 mg/dL (ref 70–99)

## 2019-03-01 SURGERY — LUMBAR LAMINECTOMY/DECOMPRESSION MICRODISCECTOMY 1 LEVEL
Anesthesia: General | Site: Spine Lumbar | Laterality: Left

## 2019-03-01 MED ORDER — VANCOMYCIN HCL IN DEXTROSE 1-5 GM/200ML-% IV SOLN
1000.0000 mg | INTRAVENOUS | Status: AC
  Administered 2019-03-01: 1000 mg via INTRAVENOUS
  Filled 2019-03-01: qty 200

## 2019-03-01 MED ORDER — PHENOL 1.4 % MT LIQD: 1.0000 | OROMUCOSAL | Status: DC | PRN

## 2019-03-01 MED ORDER — HEMOSTATIC AGENTS (NO CHARGE) OPTIME
TOPICAL | Status: DC | PRN
  Administered 2019-03-01: 1 via TOPICAL

## 2019-03-01 MED ORDER — CELECOXIB 200 MG PO CAPS
200.0000 mg | ORAL_CAPSULE | Freq: Every day | ORAL | Status: DC
  Administered 2019-03-01: 200 mg via ORAL
  Filled 2019-03-01: qty 1

## 2019-03-01 MED ORDER — MONTELUKAST SODIUM 10 MG PO TABS
10.0000 mg | ORAL_TABLET | Freq: Every day | ORAL | Status: DC
  Filled 2019-03-01: qty 1

## 2019-03-01 MED ORDER — SEMAGLUTIDE (1 MG/DOSE) 2 MG/1.5ML ~~LOC~~ SOPN: 1.0000 mg | PEN_INJECTOR | SUBCUTANEOUS | Status: DC

## 2019-03-01 MED ORDER — ONDANSETRON HCL 4 MG/2ML IJ SOLN
INTRAMUSCULAR | Status: DC | PRN
  Administered 2019-03-01: 4 mg via INTRAVENOUS

## 2019-03-01 MED ORDER — METHADONE HCL 5 MG PO TABS
2.5000 mg | ORAL_TABLET | Freq: Every day | ORAL | Status: DC
  Administered 2019-03-01: 2.5 mg via ORAL
  Filled 2019-03-01: qty 1

## 2019-03-01 MED ORDER — NALOXONE HCL 4 MG/0.1ML NA LIQD: 0.4000 mg | Freq: Once | NASAL | Status: DC

## 2019-03-01 MED ORDER — VANCOMYCIN HCL IN DEXTROSE 1-5 GM/200ML-% IV SOLN: 1000.0000 mg | Freq: Once | INTRAVENOUS | Status: DC

## 2019-03-01 MED ORDER — SODIUM CHLORIDE 0.9 % IV SOLN: 250.0000 mL | INTRAVENOUS | Status: DC

## 2019-03-01 MED ORDER — ACETAMINOPHEN 325 MG PO TABS: 650.0000 mg | ORAL_TABLET | ORAL | Status: DC | PRN

## 2019-03-01 MED ORDER — PRAMIPEXOLE DIHYDROCHLORIDE 0.25 MG PO TABS: 0.5000 mg | ORAL_TABLET | Freq: Every day | ORAL | Status: DC

## 2019-03-01 MED ORDER — FENTANYL CITRATE (PF) 250 MCG/5ML IJ SOLN
INTRAMUSCULAR | Status: AC
  Filled 2019-03-01: qty 5

## 2019-03-01 MED ORDER — CYCLOBENZAPRINE HCL 10 MG PO TABS
10.0000 mg | ORAL_TABLET | Freq: Three times a day (TID) | ORAL | Status: DC | PRN
  Administered 2019-03-01: 10 mg via ORAL
  Filled 2019-03-01: qty 1

## 2019-03-01 MED ORDER — LACTATED RINGERS IV SOLN
INTRAVENOUS | Status: DC | PRN
  Administered 2019-03-01: 09:00:00 via INTRAVENOUS

## 2019-03-01 MED ORDER — LIDOCAINE-EPINEPHRINE 1 %-1:100000 IJ SOLN
INTRAMUSCULAR | Status: AC
  Filled 2019-03-01: qty 1

## 2019-03-01 MED ORDER — ALUM & MAG HYDROXIDE-SIMETH 200-200-20 MG/5ML PO SUSP: 30.0000 mL | Freq: Four times a day (QID) | ORAL | Status: DC | PRN

## 2019-03-01 MED ORDER — FENTANYL CITRATE (PF) 100 MCG/2ML IJ SOLN
25.0000 ug | INTRAMUSCULAR | Status: DC | PRN
  Administered 2019-03-01 (×3): 25 ug via INTRAVENOUS

## 2019-03-01 MED ORDER — SCOPOLAMINE 1 MG/3DAYS TD PT72
1.0000 | MEDICATED_PATCH | TRANSDERMAL | Status: DC
  Administered 2019-03-01: 1.5 mg via TRANSDERMAL

## 2019-03-01 MED ORDER — FUROSEMIDE 40 MG PO TABS
40.0000 mg | ORAL_TABLET | Freq: Every day | ORAL | Status: DC
  Administered 2019-03-01: 40 mg via ORAL
  Filled 2019-03-01: qty 1

## 2019-03-01 MED ORDER — FENTANYL CITRATE (PF) 250 MCG/5ML IJ SOLN
INTRAMUSCULAR | Status: DC | PRN
  Administered 2019-03-01: 50 ug via INTRAVENOUS
  Administered 2019-03-01: 100 ug via INTRAVENOUS
  Administered 2019-03-01: 50 ug via INTRAVENOUS

## 2019-03-01 MED ORDER — TIZANIDINE HCL 4 MG PO TABS
8.0000 mg | ORAL_TABLET | Freq: Every day | ORAL | Status: DC
  Filled 2019-03-01: qty 2

## 2019-03-01 MED ORDER — PROMETHAZINE HCL 25 MG/ML IJ SOLN: 6.2500 mg | INTRAMUSCULAR | Status: DC | PRN

## 2019-03-01 MED ORDER — DULOXETINE HCL 30 MG PO CPEP: 120.0000 mg | ORAL_CAPSULE | Freq: Every day | ORAL | Status: DC

## 2019-03-01 MED ORDER — MENTHOL 3 MG MT LOZG: 1.0000 | LOZENGE | OROMUCOSAL | Status: DC | PRN

## 2019-03-01 MED ORDER — FENTANYL CITRATE (PF) 100 MCG/2ML IJ SOLN
INTRAMUSCULAR | Status: AC
  Administered 2019-03-01: 25 ug via INTRAVENOUS
  Filled 2019-03-01: qty 2

## 2019-03-01 MED ORDER — ROCURONIUM BROMIDE 10 MG/ML (PF) SYRINGE
PREFILLED_SYRINGE | INTRAVENOUS | Status: DC | PRN
  Administered 2019-03-01: 60 mg via INTRAVENOUS
  Administered 2019-03-01: 20 mg via INTRAVENOUS

## 2019-03-01 MED ORDER — TRAMADOL HCL 50 MG PO TABS
50.0000 mg | ORAL_TABLET | Freq: Three times a day (TID) | ORAL | Status: DC | PRN
  Administered 2019-03-01: 50 mg via ORAL
  Filled 2019-03-01: qty 1

## 2019-03-01 MED ORDER — SODIUM CHLORIDE 0.9 % IV SOLN
INTRAVENOUS | Status: DC | PRN
  Administered 2019-03-01: 09:00:00

## 2019-03-01 MED ORDER — SIMVASTATIN 20 MG PO TABS: 20.0000 mg | ORAL_TABLET | Freq: Every day | ORAL | Status: DC

## 2019-03-01 MED ORDER — DEXAMETHASONE SODIUM PHOSPHATE 10 MG/ML IJ SOLN
10.0000 mg | Freq: Once | INTRAMUSCULAR | Status: AC
  Administered 2019-03-01: 10 mg via INTRAVENOUS
  Filled 2019-03-01: qty 1

## 2019-03-01 MED ORDER — SODIUM CHLORIDE 0.9% FLUSH: 3.0000 mL | Freq: Two times a day (BID) | INTRAVENOUS | Status: DC

## 2019-03-01 MED ORDER — BUPIVACAINE HCL (PF) 0.25 % IJ SOLN
INTRAMUSCULAR | Status: DC | PRN
  Administered 2019-03-01: 10 mL

## 2019-03-01 MED ORDER — PROPOFOL 10 MG/ML IV BOLUS
INTRAVENOUS | Status: DC | PRN
  Administered 2019-03-01: 170 mg via INTRAVENOUS
  Administered 2019-03-01: 30 mg via INTRAVENOUS

## 2019-03-01 MED ORDER — DICLOFENAC SODIUM 1 % TD GEL
4.0000 g | TRANSDERMAL | Status: DC | PRN
  Filled 2019-03-01: qty 100

## 2019-03-01 MED ORDER — SODIUM CHLORIDE 0.9% FLUSH: 3.0000 mL | INTRAVENOUS | Status: DC | PRN

## 2019-03-01 MED ORDER — PROPOFOL 10 MG/ML IV BOLUS
INTRAVENOUS | Status: AC
  Filled 2019-03-01: qty 20

## 2019-03-01 MED ORDER — LIDOCAINE 2% (20 MG/ML) 5 ML SYRINGE
INTRAMUSCULAR | Status: DC | PRN
  Administered 2019-03-01: 80 mg via INTRAVENOUS

## 2019-03-01 MED ORDER — ONDANSETRON HCL 4 MG PO TABS: 4.0000 mg | ORAL_TABLET | Freq: Four times a day (QID) | ORAL | Status: DC | PRN

## 2019-03-01 MED ORDER — THROMBIN 5000 UNITS EX SOLR
CUTANEOUS | Status: DC | PRN
  Administered 2019-03-01 (×2): 5000 [IU] via TOPICAL

## 2019-03-01 MED ORDER — 0.9 % SODIUM CHLORIDE (POUR BTL) OPTIME
TOPICAL | Status: DC | PRN
  Administered 2019-03-01: 09:00:00 1000 mL

## 2019-03-01 MED ORDER — POLYETHYLENE GLYCOL 3350 17 G PO PACK: 17.0000 g | PACK | Freq: Every day | ORAL | Status: DC | PRN

## 2019-03-01 MED ORDER — MIDAZOLAM HCL 2 MG/2ML IJ SOLN
INTRAMUSCULAR | Status: AC
  Filled 2019-03-01: qty 2

## 2019-03-01 MED ORDER — LACTATED RINGERS IV SOLN
INTRAVENOUS | Status: DC
  Administered 2019-03-01: 08:00:00 via INTRAVENOUS

## 2019-03-01 MED ORDER — ACETAMINOPHEN 650 MG RE SUPP: 650.0000 mg | RECTAL | Status: DC | PRN

## 2019-03-01 MED ORDER — MIDAZOLAM HCL 2 MG/2ML IJ SOLN
INTRAMUSCULAR | Status: DC | PRN
  Administered 2019-03-01: 2 mg via INTRAVENOUS

## 2019-03-01 MED ORDER — THROMBIN 5000 UNITS EX SOLR
CUTANEOUS | Status: AC
  Filled 2019-03-01: qty 10000

## 2019-03-01 MED ORDER — ONDANSETRON HCL 4 MG/2ML IJ SOLN: 4.0000 mg | Freq: Four times a day (QID) | INTRAMUSCULAR | Status: DC | PRN

## 2019-03-01 MED ORDER — PROPOFOL 500 MG/50ML IV EMUL
INTRAVENOUS | Status: DC | PRN
  Administered 2019-03-01: 125 ug/kg/min via INTRAVENOUS

## 2019-03-01 MED ORDER — BUSPIRONE HCL 10 MG PO TABS
10.0000 mg | ORAL_TABLET | Freq: Three times a day (TID) | ORAL | Status: DC | PRN
  Filled 2019-03-01: qty 1

## 2019-03-01 MED ORDER — SCOPOLAMINE 1 MG/3DAYS TD PT72
MEDICATED_PATCH | TRANSDERMAL | Status: AC
  Filled 2019-03-01: qty 1

## 2019-03-01 MED ORDER — PANTOPRAZOLE SODIUM 40 MG IV SOLR: 40.0000 mg | Freq: Every day | INTRAVENOUS | Status: DC

## 2019-03-01 MED ORDER — HYDROMORPHONE HCL 1 MG/ML IJ SOLN
1.0000 mg | INTRAMUSCULAR | Status: DC | PRN
  Administered 2019-03-01: 1 mg via INTRAVENOUS
  Filled 2019-03-01: qty 1

## 2019-03-01 MED ORDER — BUPIVACAINE HCL (PF) 0.25 % IJ SOLN
INTRAMUSCULAR | Status: AC
  Filled 2019-03-01: qty 30

## 2019-03-01 MED ORDER — ACETAMINOPHEN 500 MG PO TABS
1000.0000 mg | ORAL_TABLET | Freq: Once | ORAL | Status: AC
  Administered 2019-03-01: 1000 mg via ORAL
  Filled 2019-03-01: qty 2

## 2019-03-01 MED ORDER — LIDOCAINE-EPINEPHRINE 1 %-1:100000 IJ SOLN
INTRAMUSCULAR | Status: DC | PRN
  Administered 2019-03-01: 10 mL

## 2019-03-01 MED ORDER — SUGAMMADEX SODIUM 200 MG/2ML IV SOLN
INTRAVENOUS | Status: DC | PRN
  Administered 2019-03-01: 160 mg via INTRAVENOUS

## 2019-03-01 MED ORDER — DIPHENHYDRAMINE HCL 50 MG/ML IJ SOLN
INTRAMUSCULAR | Status: DC | PRN
  Administered 2019-03-01: 12.5 mg via INTRAVENOUS

## 2019-03-01 SURGICAL SUPPLY — 55 items
ADH SKN CLS APL DERMABOND .7 (GAUZE/BANDAGES/DRESSINGS) ×1
APL SKNCLS STERI-STRIP NONHPOA (GAUZE/BANDAGES/DRESSINGS) ×1
BAG DECANTER FOR FLEXI CONT (MISCELLANEOUS) ×3 IMPLANT
BENZOIN TINCTURE PRP APPL 2/3 (GAUZE/BANDAGES/DRESSINGS) ×3 IMPLANT
BLADE CLIPPER SURG (BLADE) IMPLANT
BLADE SURG 11 STRL SS (BLADE) ×3 IMPLANT
BUR CUTTER 7.0 ROUND (BURR) ×3 IMPLANT
BUR MATCHSTICK NEURO 3.0 LAGG (BURR) ×3 IMPLANT
CANISTER SUCT 3000ML PPV (MISCELLANEOUS) ×3 IMPLANT
CARTRIDGE OIL MAESTRO DRILL (MISCELLANEOUS) ×1 IMPLANT
CLOSURE WOUND 1/2 X4 (GAUZE/BANDAGES/DRESSINGS) ×1
COVER WAND RF STERILE (DRAPES) ×3 IMPLANT
DECANTER SPIKE VIAL GLASS SM (MISCELLANEOUS) ×3 IMPLANT
DERMABOND ADVANCED (GAUZE/BANDAGES/DRESSINGS) ×2
DERMABOND ADVANCED .7 DNX12 (GAUZE/BANDAGES/DRESSINGS) ×1 IMPLANT
DIFFUSER DRILL AIR PNEUMATIC (MISCELLANEOUS) ×3 IMPLANT
DRAPE HALF SHEET 40X57 (DRAPES) IMPLANT
DRAPE LAPAROTOMY 100X72X124 (DRAPES) ×3 IMPLANT
DRAPE MICROSCOPE LEICA (MISCELLANEOUS) ×3 IMPLANT
DRAPE SURG 17X23 STRL (DRAPES) ×3 IMPLANT
DURAPREP 26ML APPLICATOR (WOUND CARE) ×3 IMPLANT
ELECT REM PT RETURN 9FT ADLT (ELECTROSURGICAL) ×3
ELECTRODE REM PT RTRN 9FT ADLT (ELECTROSURGICAL) ×1 IMPLANT
GAUZE 4X4 16PLY RFD (DISPOSABLE) IMPLANT
GAUZE SPONGE 4X4 12PLY STRL (GAUZE/BANDAGES/DRESSINGS) ×3 IMPLANT
GLOVE BIO SURGEON STRL SZ7 (GLOVE) IMPLANT
GLOVE BIO SURGEON STRL SZ8 (GLOVE) ×3 IMPLANT
GLOVE BIOGEL PI IND STRL 7.0 (GLOVE) IMPLANT
GLOVE BIOGEL PI INDICATOR 7.0 (GLOVE)
GLOVE EXAM NITRILE XL STR (GLOVE) IMPLANT
GLOVE INDICATOR 8.5 STRL (GLOVE) ×3 IMPLANT
GOWN STRL REUS W/ TWL LRG LVL3 (GOWN DISPOSABLE) ×1 IMPLANT
GOWN STRL REUS W/ TWL XL LVL3 (GOWN DISPOSABLE) ×2 IMPLANT
GOWN STRL REUS W/TWL 2XL LVL3 (GOWN DISPOSABLE) IMPLANT
GOWN STRL REUS W/TWL LRG LVL3 (GOWN DISPOSABLE) ×3
GOWN STRL REUS W/TWL XL LVL3 (GOWN DISPOSABLE) ×6
KIT BASIN OR (CUSTOM PROCEDURE TRAY) ×3 IMPLANT
KIT TURNOVER KIT B (KITS) ×3 IMPLANT
NDL SPNL 22GX3.5 QUINCKE BK (NEEDLE) ×1 IMPLANT
NEEDLE HYPO 22GX1.5 SAFETY (NEEDLE) ×3 IMPLANT
NEEDLE SPNL 22GX3.5 QUINCKE BK (NEEDLE) ×3 IMPLANT
NS IRRIG 1000ML POUR BTL (IV SOLUTION) ×3 IMPLANT
OIL CARTRIDGE MAESTRO DRILL (MISCELLANEOUS) ×3
PACK LAMINECTOMY NEURO (CUSTOM PROCEDURE TRAY) ×3 IMPLANT
RUBBERBAND STERILE (MISCELLANEOUS) ×6 IMPLANT
SPONGE SURGIFOAM ABS GEL SZ50 (HEMOSTASIS) ×3 IMPLANT
STRIP CLOSURE SKIN 1/2X4 (GAUZE/BANDAGES/DRESSINGS) ×2 IMPLANT
SUT VIC AB 0 CT1 18XCR BRD8 (SUTURE) ×1 IMPLANT
SUT VIC AB 0 CT1 8-18 (SUTURE) ×3
SUT VIC AB 2-0 CT1 18 (SUTURE) ×3 IMPLANT
SUT VICRYL 4-0 PS2 18IN ABS (SUTURE) ×3 IMPLANT
TAPE PAPER 3X10 WHT MICROPORE (GAUZE/BANDAGES/DRESSINGS) ×2 IMPLANT
TOWEL GREEN STERILE (TOWEL DISPOSABLE) ×3 IMPLANT
TOWEL GREEN STERILE FF (TOWEL DISPOSABLE) ×3 IMPLANT
WATER STERILE IRR 1000ML POUR (IV SOLUTION) ×3 IMPLANT

## 2019-03-01 NOTE — H&P (Signed)
Katrina Fernandez is an 53 y.o. female.   Chief Complaint: Back and left leg pain HPI: 53 year old female with back and left leg pain rating down to her quad consistent with an L3 nerve root pattern.  Work-up revealed a disc herniation in the extraforaminal space at L3-4 on the left.  Due to her progression of clinical syndrome imaging findings failure conservative treatment I recommended extraforaminal discectomy at L3-4 on the left I extensively went over the risks and benefits of that procedure with her as well as perioperative course expectations of outcome and alternatives of surgery and she understands and agrees to proceed forward.  Past Medical History:  Diagnosis Date  . Anemia   . Anxiety   . Arthritis    "back and knees" (07/23/2016)  . Chiari malformation type I ([place]) dx'd 2014  . Chronic back pain    buldging, DDD; "neck and lower back" (07/23/2016)  . Chronic constipation    takes Miralax daily  . Chronic pain syndrome    takes Methadone and Keppra daily  . Depression    takes Effexor daily  . Endometriosis    1995  . Fibromyalgia   . GERD (gastroesophageal reflux disease)    not taking anything at the present time  . History of DVT of lower extremity 07/2012   LEFT LEG --  3/ 2014.Was on a blood transfusion for 6 months  . History of kidney stones   . History of MRSA infection 10 yrs ago  . Hyperlipidemia    takes Simvastatin daily  . Migraine    "daily recently" (07/23/2016)  . Muscle spasm    takes Zanaflex nightly  . Peripheral edema    takes Lasix daily as needed  . Peripheral neuropathy   . Pleurisy 2000  . Pneumonia    hx of-7-8 yrs ago  . PONV (postoperative nausea and vomiting)   . Seasonal allergies    takes Claritin daily  . Spinal headache    "since 2002; still get them; try to manage it w/lying flat"  . Weakness    and numbness in right hand    Past Surgical History:  Procedure Laterality Date  . ANTERIOR CERVICAL DECOMP/DISCECTOMY FUSION   07-25-2009   C5 -- C6  . APPENDECTOMY  age 34  . APPLICATION OF CRANIAL NAVIGATION N/A 06/23/2017   Procedure: APPLICATION OF CRANIAL NAVIGATION;  Surgeon: Kary Kos, MD;  Location: [place];  Service: Neurosurgery;  Laterality: N/A;  . BACK SURGERY    . BRAIN SURGERY     VP shunt  . BREAST REDUCTION SURGERY Bilateral 06/07/2013   Procedure: BILATERAL MAMMARY REDUCTION  (BREAST);  Surgeon: Theodoro Kos, DO;  Location: [place];  Service: Plastics;  Laterality: Bilateral;  . BREAST REDUCTION SURGERY Bilateral 06/07/2013   Procedure:  LIPOSUCTION;  Surgeon: Theodoro Kos, DO;  Location: [place];  Service: Plastics;  Laterality: Bilateral;  . CARPAL TUNNEL RELEASE Bilateral 2001  . COLONOSCOPY  X 2   "polyps were benign"  . DILATION AND CURETTAGE OF UTERUS    . ESOPHAGOGASTRODUODENOSCOPY    . gastric banding undone  11/2014  . LAPAROSCOPIC CHOLECYSTECTOMY  ~ 2000  . LAPAROSCOPIC GASTRIC BANDING  2011  . LAPAROSCOPIC REVISION VENTRICULAR-PERITONEAL (V-P) SHUNT N/A 06/23/2017   Procedure: Shunt Placment stereotactic ventricular catheter placement and general surgery abdominal exposure  (Laparoscopic vs. Open) Brain Lab;  Surgeon: Clovis Riley, MD;  Location: [place];  Service: General;  Laterality: N/A;  . LAPAROSCOPY  N/A 06/23/2017   Procedure: LAPAROSCOPY DIAGNOSTIC;  Surgeon: Clovis Riley, MD;  Location: [place];  Service: General;  Laterality: N/A;  . LUMBAR LAMINECTOMY/DECOMPRESSION MICRODISCECTOMY N/A 07/23/2016   Procedure: Decompressive Lumbar Laminectomy Lumbar two-three  for Repair of  Cerberal Spinal Fluid  Leak;  Surgeon: Kary Kos, MD;  Location: [place];  Service: Neurosurgery;  Laterality: N/A;  . PLACEMENT LUMBOPERITONEAL SHUNT FOR CEREBROSPINAL FLUID DIVERSION AWAY FROM C8 PERINEURAL CYST  09-05-2003  &  12-24-2003   08-31-2003  REMOVAL SHUNT  . POSTERIOR CERVICAL LAMINECTOMY  03-01-2001   C7-8 and Exploration C8 perineural cyst  . POSTERIOR LUMBAR  LAMINECTOMY L4-5/  DISKECTOMY L5-S1  05-20-2005  . RE-DO DECOMPRESSION LAMINECTOMY AND FUSION L4-5  &  L5-S1  12-20-2006   02-04-2007--  RE-EXPLORATION L4 to S1, I & D LUMBAR WOUND HEMATOMA  . REDUCTION MAMMAPLASTY    . SHUNT REMOVAL N/A 07/10/2016   Procedure: SHUNT REMOVAL;  Surgeon: Kary Kos, MD;  Location: [place];  Service: Neurosurgery;  Laterality: N/A;  SHUNT REMOVAL  . SPINAL CORD STIMULATOR INSERTION N/A 04/26/2015   Procedure: LUMBAR SPINAL CORD STIMULATOR INSERTION;  Surgeon: Clydell Hakim, MD;  Location: [place] [facility];  Service: Neurosurgery;  Laterality: N/A;  LUMBAR SPINAL CORD STIMULATOR INSERTION  . TENNIS ELBOW RELEASE/NIRSCHEL PROCEDURE Left 12/13/2014   Procedure: LEFT ELBOW LATERAL EPICONDYLAR DEBRIDEMENT AND OR REPAIR -RECONSTRUCTION ;  Surgeon: Iran Planas, MD;  Location: [place];  Service: Orthopedics;  Laterality: Left;  . TOTAL ABDOMINAL HYSTERECTOMY  1994   w/  Bilateral Salpinoophorectomy  . VENTRICULOPERITONEAL SHUNT N/A 06/23/2017   Procedure: VENTRICULAR-PERITONEAL SHUNT INSERTION WITH ABDOMINAL LAPARASCOPY AND BRAINLAB NAVIGATION;  Surgeon: Kary Kos, MD;  Location: [place];  Service: Neurosurgery;  Laterality: N/A;    Family History  Problem Relation Age of Onset  . Depression Other   . Colon cancer Father   . Ovarian cancer Maternal Grandmother   . Pancreatic cancer Maternal Grandfather    Social History:  reports that she has never smoked. She has never used smokeless tobacco. She reports that she does not drink alcohol or use drugs.  Allergies:  Allergies  Allergen Reactions  . Penicillins Anaphylaxis    Has patient had a PCN reaction causing immediate rash, facial/tongue/throat swelling, SOB or lightheadedness with hypotension: Yes Has patient had a PCN reaction causing severe rash involving mucus membranes or skin necrosis: No Has patient had a PCN reaction that required hospitalization No Has patient had a PCN reaction occurring within  the last 10 years: No If all of the above answers are "NO", then may proceed with Cephalosporin use.   . Lactose Nausea And Vomiting and Swelling       . Chlorhexidine Rash and Other (See Comments)    Dermatitis - Allergic  . Tape Rash    Plastic tape, silicone tape    Medications Prior to Admission  Medication Sig Dispense Refill  . busPIRone (BUSPAR) 10 MG tablet TAKE 1 TABLET BY MOUTH THREE TIMES A DAY (Patient taking differently: Take 10 mg by mouth 3 (three) times daily as needed (anxiety). ) 270 tablet 1  . celecoxib (CELEBREX) 200 MG capsule Take 200 mg by mouth daily.    . diclofenac sodium (VOLTAREN) 1 % GEL Apply 4 g topically 4 (four) times daily. To affected joint. (Patient taking differently: Apply 4 g topically as needed (pain). To affected joint.) 100 g 11  . DULoxetine (CYMBALTA) 60 MG capsule TAKE 2 CAPSULES BY MOUTH EVERY DAY (  Patient taking differently: Take 120 mg by mouth daily. ) 180 capsule 1  . furosemide (LASIX) 40 MG tablet TAKE 1 TABLET BY MOUTH TWICE A DAY (Patient taking differently: Take 40 mg by mouth daily. ) 180 tablet 1  . methadone (DOLOPHINE) 5 MG tablet Take 2.5 mg by mouth daily.   0  . montelukast (SINGULAIR) 10 MG tablet TAKE 1 TABLET (10 MG TOTAL) BY MOUTH AT BEDTIME. (Patient taking differently: Take 10 mg by mouth at bedtime. TAKE 1 TABLET (10 MG TOTAL) BY MOUTH AT BEDTIME.) 90 tablet 1  . NARCAN 4 MG/0.1ML LIQD nasal spray kit Place 0.4 mg into the nose once.     Marland Kitchen OZEMPIC, 1 MG/DOSE, 2 MG/1.5ML SOPN INJECT 1 MG INTO THE SKIN ONCE A WEEK. (Patient taking differently: Inject 1 mg into the skin once a week. Friday) 9 pen 1  . polyethylene glycol (MIRALAX / GLYCOLAX) 17 g packet Take 17 g by mouth daily as needed for mild constipation or moderate constipation.    . pramipexole (MIRAPEX) 0.5 MG tablet Take 1 tablet (0.5 mg total) by mouth at bedtime. (Patient taking differently: Take 0.5 mg by mouth at bedtime. Must be taken before by 18:30) 90 tablet 3   . simvastatin (ZOCOR) 20 MG tablet TAKE 1 TABLET BY MOUTH DAILY. (Patient taking differently: Take 20 mg by mouth at bedtime. TAKE 1 TABLET BY MOUTH DAILY.) 90 tablet 1  . tiZANidine (ZANAFLEX) 4 MG capsule Take 8 mg by mouth at bedtime. Take an additional 4 mg as needed for muscle spasm    . traMADol (ULTRAM) 50 MG tablet Take 50 mg by mouth 3 (three) times daily as needed (Pain).       Results for orders placed or performed during the hospital encounter of 03/01/19 (from the past 48 hour(s))  Glucose, capillary     Status: None   Collection Time: 03/01/19  6:50 AM  Result Value Ref Range   Glucose-Capillary 93 70 - 99 mg/dL   No results found.  Review of Systems  Musculoskeletal: Positive for back pain.  Neurological: Positive for tingling and sensory change.    Blood pressure 128/90, pulse 88, temperature 98.5 F (36.9 C), temperature source Oral, resp. rate 18, SpO2 98 %. Physical Exam  Constitutional: She appears well-developed.  HENT:  Head: Normocephalic.  Eyes: Pupils are equal, round, and reactive to light.  Neck: Normal range of motion.  Respiratory: Effort normal.  GI: Soft.  Musculoskeletal: Normal range of motion.  Neurological: She is alert. She has normal strength. GCS eye subscore is 4. GCS verbal subscore is 5. GCS motor subscore is 6.  Strength is 5-5 iliopsoas, quads, hamstrings, gastrocs, into tibialis, and EHL on the right she is got some trace quadricep weakness on the left.  Skin: Skin is warm and dry.     Assessment/Plan 53 year old female with left L3 radiculopathy presents for extraforaminal discectomy L3-4 on the left  Dawt Reeb P, MD 03/01/2019, 8:20 AM

## 2019-03-01 NOTE — Progress Notes (Addendum)
Pharmacy Antibiotic Note  Katrina Fernandez is a 53 y.o. female admitted on 03/01/2019 for lumbar surgery.  Pharmacy has been consulted for Vancomycin dosing x 1 dose post-op for surgical prophylaxis.  No drain.  Vancomycin 1 gram IV given pre-op at 7:40am.  Plan:  Vancomycin 1gm IV x 1 at 8pm tonight.  No follow up needed.  Pharmacy signing off.   Temp (24hrs), Avg:98.1 F (36.7 C), Min:97.7 F (36.5 C), Max:98.5 F (36.9 C)  Recent Labs  Lab 02/27/19 0925  WBC 7.9  CREATININE 0.81    Estimated Creatinine Clearance: 82.9 mL/min (by C-G formula based on SCr of 0.81 mg/dL).    Allergies  Allergen Reactions  . Penicillins Anaphylaxis    Has patient had a PCN reaction causing immediate rash, facial/tongue/throat swelling, SOB or lightheadedness with hypotension: Yes Has patient had a PCN reaction causing severe rash involving mucus membranes or skin necrosis: No Has patient had a PCN reaction that required hospitalization No Has patient had a PCN reaction occurring within the last 10 years: No If all of the above answers are "NO", then may proceed with Cephalosporin use.   . Lactose Nausea And Vomiting and Swelling       . Chlorhexidine Rash and Other (See Comments)    Dermatitis - Allergic  . Tape Rash    Plastic tape, silicone tape    Arty Baumgartner, [place] Pager: [phone] or phone: [phone] 03/01/2019 12:54 PM

## 2019-03-01 NOTE — Transfer of Care (Signed)
Immediate Anesthesia Transfer of Care Note  Patient: Katrina Fernandez  Procedure(s) Performed: LEFT LUMBAR THREE-FOUR EXTRAFORAMINAL MICRODISCECTOMY (Left Spine Lumbar)  Patient Location: PACU  Anesthesia Type:General  Level of Consciousness: drowsy and patient cooperative  Airway & Oxygen Therapy: Patient Spontanous Breathing and Patient connected to nasal cannula oxygen  Post-op Assessment: Report given to RN, Post -op Vital signs reviewed and stable and Patient moving all extremities X 4  Post vital signs: Reviewed and stable  Last Vitals:  Vitals Value Taken Time  BP    Temp    Pulse 68 03/01/19 1026  Resp 8 03/01/19 1026  SpO2 100 % 03/01/19 1026  Vitals shown include unvalidated device data.  Last Pain:  Vitals:   03/01/19 0714  TempSrc:   PainSc: 7       Patients Stated Pain Goal: 5 ([id number])  Complications: No apparent anesthesia complications

## 2019-03-01 NOTE — Discharge Summary (Signed)
Physician Discharge Summary  Patient ID: Katrina Fernandez MRN: [id number] DOB/AGE: 1965-09-15 53 y.o. Estimated body mass index is 31.73 kg/m as calculated from the following:   Height as of 02/27/19: _0  (1.6 m).   Weight as of 02/27/19: 81.2 kg.   Admit date: 03/01/2019 Discharge date: 03/01/2019  Admission Diagnoses: Herniated nucleus pulposus L3-4 left  Discharge Diagnoses: Same Active Problems:   HNP (herniated nucleus pulposus), lumbar   Discharged Condition: good  Hospital Course: Patient and husband underwent extraforaminal discectomy L3-4 on the left.  Postoperatively patient did very well went to the recovery room the floor on the floor was ambulating and voiding spontaneously tolerating regular diet and stable for discharge home.  Patient be discharged scheduled follow-up 1 to 2 weeks.  Consults: Significant Diagnostic Studies: Treatments: Extraforaminal discectomy L3-4 left Discharge Exam: Blood pressure 113/71, pulse 82, temperature 98.1 F (36.7 C), temperature source Oral, resp. rate 18, SpO2 95 %. Strength 5-5 wound clean dry and intact  Disposition: Home   Allergies as of 03/01/2019      Reactions   Penicillins Anaphylaxis   Has patient had a PCN reaction causing immediate rash, facial/tongue/throat swelling, SOB or lightheadedness with hypotension: Yes Has patient had a PCN reaction causing severe rash involving mucus membranes or skin necrosis: No Has patient had a PCN reaction that required hospitalization No Has patient had a PCN reaction occurring within the last 10 years: No If all of the above answers are "NO", then may proceed with Cephalosporin use.   Lactose Nausea And Vomiting, Swelling       Chlorhexidine Rash, Other (See Comments)   Dermatitis - Allergic   Tape Rash   Plastic tape, silicone tape      Medication List    TAKE these medications   busPIRone 10 MG tablet Commonly known as: BUSPAR TAKE 1 TABLET BY MOUTH THREE TIMES A  DAY What changed:   when to take this  reasons to take this   celecoxib 200 MG capsule Commonly known as: CELEBREX Take 200 mg by mouth daily.   diclofenac sodium 1 % Gel Commonly known as: VOLTAREN Apply 4 g topically 4 (four) times daily. To affected joint. What changed:   when to take this  reasons to take this   DULoxetine 60 MG capsule Commonly known as: CYMBALTA TAKE 2 CAPSULES BY MOUTH EVERY DAY   furosemide 40 MG tablet Commonly known as: LASIX TAKE 1 TABLET BY MOUTH TWICE A DAY What changed: when to take this   methadone 5 MG tablet Commonly known as: DOLOPHINE Take 2.5 mg by mouth daily.   montelukast 10 MG tablet Commonly known as: SINGULAIR TAKE 1 TABLET (10 MG TOTAL) BY MOUTH AT BEDTIME. What changed: See the new instructions.   Narcan 4 MG/0.1ML Liqd nasal spray kit Generic drug: naloxone Place 0.4 mg into the nose once.   Ozempic (1 MG/DOSE) 2 MG/1.5ML Sopn Generic drug: Semaglutide (1 MG/DOSE) INJECT 1 MG INTO THE SKIN ONCE A WEEK. What changed: See the new instructions.   polyethylene glycol 17 g packet Commonly known as: MIRALAX / GLYCOLAX Take 17 g by mouth daily as needed for mild constipation or moderate constipation.   pramipexole 0.5 MG tablet Commonly known as: MIRAPEX Take 1 tablet (0.5 mg total) by mouth at bedtime. What changed: additional instructions   simvastatin 20 MG tablet Commonly known as: ZOCOR TAKE 1 TABLET BY MOUTH DAILY. What changed:   when to take this  additional instructions  tiZANidine 4 MG capsule Commonly known as: ZANAFLEX Take 8 mg by mouth at bedtime. Take an additional 4 mg as needed for muscle spasm   traMADol 50 MG tablet Commonly known as: ULTRAM Take 50 mg by mouth 3 (three) times daily as needed (Pain).        Signed: Pattiann Solanki P 03/01/2019, 3:44 PM

## 2019-03-01 NOTE — Progress Notes (Signed)
Discharged instructions/education/Rx/AVS given to patient and verbalized understanding. Ambulating well with supervision, MAE well. Pain is tolerable and controlled by PRN meds per patient. Voiding and emptying bladder well. No drainage, no swelling, no redness on incision site. Dressing CDI. Patient awaiting for transport.

## 2019-03-01 NOTE — Anesthesia Procedure Notes (Signed)
Procedure Name: Intubation Date/Time: 03/01/2019 8:40 AM Performed by: Larene Beach, CRNA Pre-anesthesia Checklist: Patient identified, Emergency Drugs available, Suction available and Patient being monitored Patient Re-evaluated:Patient Re-evaluated prior to induction Oxygen Delivery Method: Circle system utilized Preoxygenation: Pre-oxygenation with 100% oxygen Induction Type: IV induction Ventilation: Mask ventilation without difficulty Laryngoscope Size: Mac and 3 Grade View: Grade I Tube type: Oral Tube size: 7.0 mm Number of attempts: 1 Airway Equipment and Method: Stylet Placement Confirmation: ETT inserted through vocal cords under direct vision,  positive ETCO2 and breath sounds checked- equal and bilateral Secured at: 22 cm Tube secured with: Tape Dental Injury: Teeth and Oropharynx as per pre-operative assessment

## 2019-03-01 NOTE — Op Note (Signed)
Preoperative diagnosis: Herniated nucleus pulposus L3-4 left extraforaminal  Postoperative diagnosis: Same  Procedure: Extraforaminal microdiscectomy L3-4 on the left with microdissection of the left L3 nerve root microscopic discectomy  Surgeon: [name] Tadd Holtmeyer  Assistant: Nash Shearer  Anesthesia: General  EBL: Minimal  HPI: 53 year old female said a previous L4-S1 fusion developed progressive worsening left buttock and leg pain anterior quad consistent with an L3 nerve root pattern.  Work-up revealed discontinuation extraforaminal at L3-4 on the left.  Due to patient failed conservative treatment imaging findings and progression of clinical syndrome I recommended extraforaminal discectomy at L3-4 on the left I extensively went over the risks and benefits of that operation with her as well as perioperative course expectations of outcome and alternatives of surgery and she understood and agreed to proceed forward.  Operative procedure: Patient brought into the OR esthesia General anesthesia positioned prone the Wilson frame her back was prepped and draped in routine sterile fashion preoperative x-ray localized the appropriate level so the skin incision was injected with 10 cc lidocaine with epi and scar tissue was dissected free and subperiosteal dissection was carried out exposing the 2 3 facet joint the 3 TP the lateral pars and the 3 4 facet joint.  Intraoperative x-ray initially identified the 2 pedicle and I took attention 1 interspace below this and identified the 3 pedicle with a follow-up x-ray.  Then working inferior to that drilled off the lateral pars and superior aspect of 3 4 facet.  Then under bit the lateral pars and superior aspect the facet identified the intertransverse ligament removed in piecemeal fashion identified the L3 nerve root.  There was a disc herniation still contained within the ligament and disc bulge underneath the lateral facet joint causing compression of the L3 nerve  root to space was incised with lumbar scalpel to space was cleaned out with with pituitary rongeurs and Epstein curettes.  I confirmed location of the correct level because I did not find a free fragment with a follow-up x-ray that confirmed the 3 4 disc base sure to have adequate foraminotomy the L3 nerve root adequate discectomy no further stenosis.  No further disc fragments appreciated wound scopes irrigated meticulous hemostasis was maintained Gelfoam was overlaid top of the extraforaminal decompression and the wound was closed in layers with interrupted Vicryl in a running 4-0 subcuticular.  My assistant helped in neural retraction during the critical parts of the case and closure.  At the end the case all needle count sponge counts were correct.

## 2019-03-02 ENCOUNTER — Encounter (HOSPITAL_COMMUNITY): Payer: Self-pay | Admitting: Neurosurgery

## 2019-03-02 NOTE — Anesthesia Postprocedure Evaluation (Signed)
Anesthesia Post Note  Patient: Katrina Fernandez  Procedure(s) Performed: LEFT LUMBAR THREE-FOUR EXTRAFORAMINAL MICRODISCECTOMY (Left Spine Lumbar)     Patient location during evaluation: PACU Anesthesia Type: General Level of consciousness: awake and alert Pain management: pain level controlled Vital Signs Assessment: post-procedure vital signs reviewed and stable Respiratory status: spontaneous breathing, nonlabored ventilation and respiratory function stable Cardiovascular status: blood pressure returned to baseline and stable Postop Assessment: no apparent nausea or vomiting Anesthetic complications: no    Last Vitals:  Vitals:   03/01/19 1130 03/01/19 1146  BP:  113/71  Pulse: 87 82  Resp: 12 18  Temp: 36.5 C 36.7 C  SpO2: 95% 95%    Last Pain:  Vitals:   03/01/19 1344  TempSrc:   PainSc: 10-Worst pain ever                 Catalina Gravel

## 2019-03-14 ENCOUNTER — Ambulatory Visit (INDEPENDENT_AMBULATORY_CARE_PROVIDER_SITE_OTHER): Payer: Medicare HMO | Admitting: Psychology

## 2019-03-14 DIAGNOSIS — F411 Generalized anxiety disorder: Secondary | ICD-10-CM | POA: Diagnosis not present

## 2019-03-14 DIAGNOSIS — F4323 Adjustment disorder with mixed anxiety and depressed mood: Secondary | ICD-10-CM | POA: Diagnosis not present

## 2019-03-14 DIAGNOSIS — R69 Illness, unspecified: Secondary | ICD-10-CM | POA: Diagnosis not present

## 2019-03-28 ENCOUNTER — Ambulatory Visit: Payer: Medicare HMO | Admitting: Psychology

## 2019-04-04 DIAGNOSIS — M5440 Lumbago with sciatica, unspecified side: Secondary | ICD-10-CM | POA: Diagnosis not present

## 2019-04-04 DIAGNOSIS — M5126 Other intervertebral disc displacement, lumbar region: Secondary | ICD-10-CM | POA: Diagnosis not present

## 2019-04-11 ENCOUNTER — Other Ambulatory Visit: Payer: Self-pay | Admitting: Neurosurgery

## 2019-04-11 ENCOUNTER — Ambulatory Visit (INDEPENDENT_AMBULATORY_CARE_PROVIDER_SITE_OTHER): Payer: Medicare HMO | Admitting: Psychology

## 2019-04-11 DIAGNOSIS — F4323 Adjustment disorder with mixed anxiety and depressed mood: Secondary | ICD-10-CM | POA: Diagnosis not present

## 2019-04-11 DIAGNOSIS — F411 Generalized anxiety disorder: Secondary | ICD-10-CM

## 2019-04-11 DIAGNOSIS — M5126 Other intervertebral disc displacement, lumbar region: Secondary | ICD-10-CM

## 2019-04-11 DIAGNOSIS — R69 Illness, unspecified: Secondary | ICD-10-CM | POA: Diagnosis not present

## 2019-04-12 ENCOUNTER — Telehealth: Payer: Self-pay | Admitting: Nurse Practitioner

## 2019-04-12 NOTE — Telephone Encounter (Signed)
Phone call to patient to verify medication list and allergies for myelogram procedure. Pt instructed to hold Buspar, Cymbalta, and Tramadol for 48hrs prior to myelogram appointment time. Pt verbalized understanding. Pre and post procedure instructions reviewed with pt.

## 2019-04-25 ENCOUNTER — Ambulatory Visit (INDEPENDENT_AMBULATORY_CARE_PROVIDER_SITE_OTHER): Payer: Medicare HMO | Admitting: Psychology

## 2019-04-25 DIAGNOSIS — F411 Generalized anxiety disorder: Secondary | ICD-10-CM | POA: Diagnosis not present

## 2019-04-25 DIAGNOSIS — R69 Illness, unspecified: Secondary | ICD-10-CM | POA: Diagnosis not present

## 2019-04-25 DIAGNOSIS — F4323 Adjustment disorder with mixed anxiety and depressed mood: Secondary | ICD-10-CM | POA: Diagnosis not present

## 2019-04-26 ENCOUNTER — Ambulatory Visit
Admission: RE | Admit: 2019-04-26 | Discharge: 2019-04-26 | Disposition: A | Discharge status: Home / Self Care | Payer: Medicare HMO | Source: Ambulatory Visit | Attending: Neurosurgery | Admitting: Neurosurgery

## 2019-04-26 DIAGNOSIS — M5136 Other intervertebral disc degeneration, lumbar region: Secondary | ICD-10-CM | POA: Diagnosis not present

## 2019-04-26 DIAGNOSIS — M48061 Spinal stenosis, lumbar region without neurogenic claudication: Secondary | ICD-10-CM | POA: Diagnosis not present

## 2019-04-26 DIAGNOSIS — M5126 Other intervertebral disc displacement, lumbar region: Secondary | ICD-10-CM | POA: Diagnosis not present

## 2019-04-26 DIAGNOSIS — M47816 Spondylosis without myelopathy or radiculopathy, lumbar region: Secondary | ICD-10-CM | POA: Diagnosis not present

## 2019-04-26 MED ORDER — IOPAMIDOL (ISOVUE-M 200) INJECTION 41%
20.0000 mL | Freq: Once | INTRAMUSCULAR | Status: AC
  Administered 2019-04-26: 20 mL via INTRATHECAL

## 2019-04-26 MED ORDER — DIAZEPAM 5 MG PO TABS
10.0000 mg | ORAL_TABLET | Freq: Once | ORAL | Status: AC
  Administered 2019-04-26: 10 mg via ORAL

## 2019-04-26 MED ORDER — DIAZEPAM 5 MG PO TABS: 5.0000 mg | ORAL_TABLET | Freq: Once | ORAL | Status: DC

## 2019-04-26 NOTE — Discharge Instructions (Signed)
Myelogram Discharge Instructions  1. Go home and rest quietly for the next 24 hours.  It is important to lie flat for the next 24 hours.  Get up only to go to the restroom.  You may lie in the bed or on a couch on your back, your stomach, your left side or your right side.  You may have one pillow under your head.  You may have pillows between your knees while you are on your side or under your knees while you are on your back.  2. DO NOT drive today.  Recline the seat as far back as it will go, while still wearing your seat belt, on the way home.  3. You may get up to go to the bathroom as needed.  You may sit up for 10 minutes to eat.  You may resume your normal diet and medications unless otherwise indicated.  Drink lots of extra fluids today and tomorrow.  4. The incidence of headache, nausea, or vomiting is about 5% (one in 20 patients).  If you develop a headache, lie flat and drink plenty of fluids until the headache goes away.  Caffeinated beverages may be helpful.  If you develop severe nausea and vomiting or a headache that does not go away with flat bed rest, call [phone].  5. You may resume normal activities after your 24 hours of bed rest is over; however, do not exert yourself strongly or do any heavy lifting tomorrow. If when you get up you have a headache when standing, go back to bed and force fluids for another 24 hours.  6. Call your physician for a follow-up appointment.  The results of your myelogram will be sent directly to your physician by the following day.  7. If you have any questions or if complications develop after you arrive home, please call [phone].  Discharge instructions have been explained to the patient.  The patient, or the person responsible for the patient, fully understands these instructions.  YOU MAY RESTART YOUR CYMBALTA, TRAMADOL AND BUSPAR TOMORROW 04/27/2019 AT 09:30AM

## 2019-04-27 DIAGNOSIS — Z79899 Other long term (current) drug therapy: Secondary | ICD-10-CM | POA: Diagnosis not present

## 2019-04-27 DIAGNOSIS — M961 Postlaminectomy syndrome, not elsewhere classified: Secondary | ICD-10-CM | POA: Diagnosis not present

## 2019-04-27 DIAGNOSIS — M7061 Trochanteric bursitis, right hip: Secondary | ICD-10-CM | POA: Diagnosis not present

## 2019-04-27 DIAGNOSIS — M79605 Pain in left leg: Secondary | ICD-10-CM | POA: Diagnosis not present

## 2019-05-06 ENCOUNTER — Other Ambulatory Visit: Payer: Self-pay | Admitting: Family Medicine

## 2019-05-09 ENCOUNTER — Ambulatory Visit (INDEPENDENT_AMBULATORY_CARE_PROVIDER_SITE_OTHER): Payer: Medicare HMO | Admitting: Psychology

## 2019-05-09 DIAGNOSIS — F411 Generalized anxiety disorder: Secondary | ICD-10-CM | POA: Diagnosis not present

## 2019-05-09 DIAGNOSIS — F4323 Adjustment disorder with mixed anxiety and depressed mood: Secondary | ICD-10-CM

## 2019-05-09 DIAGNOSIS — R69 Illness, unspecified: Secondary | ICD-10-CM | POA: Diagnosis not present

## 2019-05-17 DIAGNOSIS — M5126 Other intervertebral disc displacement, lumbar region: Secondary | ICD-10-CM | POA: Diagnosis not present

## 2019-05-18 ENCOUNTER — Other Ambulatory Visit: Payer: Self-pay | Admitting: Family Medicine

## 2019-05-23 ENCOUNTER — Ambulatory Visit (INDEPENDENT_AMBULATORY_CARE_PROVIDER_SITE_OTHER): Payer: Medicare HMO | Admitting: Psychology

## 2019-05-23 DIAGNOSIS — F4323 Adjustment disorder with mixed anxiety and depressed mood: Secondary | ICD-10-CM

## 2019-05-23 DIAGNOSIS — F411 Generalized anxiety disorder: Secondary | ICD-10-CM | POA: Diagnosis not present

## 2019-05-23 DIAGNOSIS — R69 Illness, unspecified: Secondary | ICD-10-CM | POA: Diagnosis not present

## 2019-05-25 DIAGNOSIS — Z79899 Other long term (current) drug therapy: Secondary | ICD-10-CM | POA: Diagnosis not present

## 2019-05-25 DIAGNOSIS — M79609 Pain in unspecified limb: Secondary | ICD-10-CM | POA: Diagnosis not present

## 2019-05-25 DIAGNOSIS — M5440 Lumbago with sciatica, unspecified side: Secondary | ICD-10-CM | POA: Diagnosis not present

## 2019-05-25 DIAGNOSIS — M961 Postlaminectomy syndrome, not elsewhere classified: Secondary | ICD-10-CM | POA: Diagnosis not present

## 2019-06-02 DIAGNOSIS — M5126 Other intervertebral disc displacement, lumbar region: Secondary | ICD-10-CM | POA: Diagnosis not present

## 2019-06-02 DIAGNOSIS — M5440 Lumbago with sciatica, unspecified side: Secondary | ICD-10-CM | POA: Diagnosis not present

## 2019-06-05 ENCOUNTER — Ambulatory Visit (INDEPENDENT_AMBULATORY_CARE_PROVIDER_SITE_OTHER): Payer: Medicare HMO | Admitting: Psychology

## 2019-06-05 DIAGNOSIS — F4323 Adjustment disorder with mixed anxiety and depressed mood: Secondary | ICD-10-CM

## 2019-06-05 DIAGNOSIS — F411 Generalized anxiety disorder: Secondary | ICD-10-CM

## 2019-06-05 DIAGNOSIS — R69 Illness, unspecified: Secondary | ICD-10-CM | POA: Diagnosis not present

## 2019-06-06 ENCOUNTER — Ambulatory Visit: Payer: Medicare HMO | Admitting: Psychology

## 2019-06-14 ENCOUNTER — Other Ambulatory Visit: Payer: Self-pay | Admitting: Neurosurgery

## 2019-06-16 DIAGNOSIS — M5126 Other intervertebral disc displacement, lumbar region: Secondary | ICD-10-CM | POA: Diagnosis not present

## 2019-06-20 ENCOUNTER — Ambulatory Visit (INDEPENDENT_AMBULATORY_CARE_PROVIDER_SITE_OTHER): Payer: Medicare HMO | Admitting: Psychology

## 2019-06-20 DIAGNOSIS — F411 Generalized anxiety disorder: Secondary | ICD-10-CM | POA: Diagnosis not present

## 2019-06-20 DIAGNOSIS — F4323 Adjustment disorder with mixed anxiety and depressed mood: Secondary | ICD-10-CM

## 2019-06-20 DIAGNOSIS — R69 Illness, unspecified: Secondary | ICD-10-CM | POA: Diagnosis not present

## 2019-06-23 NOTE — Pre-Procedure Instructions (Signed)
[facility] [place] - [address] [place] [address] Phone: [phone] Fax: [phone]     Your procedure is scheduled on Wednesday February 10th.  Report to [facility] Main Entrance "A" at 8:30 A.M., and check in at the Admitting office.  Call this number if you have problems the morning of surgery:  [phone]  Call [phone] if you have any questions prior to your surgery date Monday-Friday 8am-4pm    Remember:  Do not eat or drink after midnight the night before your surgery   Take these medicines the morning of surgery with A SIP OF WATER  busPIRone (BUSPAR) DULoxetine (CYMBALTA) methadone (DOLOPHINE) tiZANidine (ZANAFLEX) if needed traMADol (ULTRAM) if needed   As of today, STOP taking any Aspirin (unless otherwise instructed by your surgeon), celecoxib (CELEBREX), diclofenac sodium (VOLTAREN) 1 % GEL Aleve, Naproxen, Ibuprofen, Motrin, Advil, Goody's, BC's, all herbal medications, fish oil, and all vitamins.   HOW TO MANAGE YOUR DIABETES BEFORE AND AFTER SURGERY  Why is it important to control my blood sugar before and after surgery? . Improving blood sugar levels before and after surgery helps healing and can limit problems. . A way of improving blood sugar control is eating a healthy diet by: o  Eating less sugar and carbohydrates o  Increasing activity/exercise o  Talking with your doctor about reaching your blood sugar goals . High blood sugars (greater than 180 mg/dL) can raise your risk of infections and slow your recovery, so you will need to focus on controlling your diabetes during the weeks before surgery. . Make sure that the doctor who takes care of your diabetes knows about your planned surgery including the date and location.  How do I manage my blood sugar before surgery? . Check your blood sugar at least 4 times a day, starting 2 days before surgery, to make sure that the level is not too high or  low. o Check your blood sugar the morning of your surgery when you wake up and every 2 hours until you get to the Short Stay unit. . If your blood sugar is less than 70 mg/dL, you will need to treat for low blood sugar: o Do not take insulin. o Treat a low blood sugar (less than 70 mg/dL) with  cup of clear juice (cranberry or apple), 4 glucose tablets, OR glucose gel. Recheck blood sugar in 15 minutes after treatment (to make sure it is greater than 70 mg/dL). If your blood sugar is not greater than 70 mg/dL on recheck, call [phone] o  for further instructions. . Report your blood sugar to the short stay nurse when you get to Short Stay.  . If you are admitted to the hospital after surgery: o Your blood sugar will be checked by the staff and you will probably be given insulin after surgery (instead of oral diabetes medicines) to make sure you have good blood sugar levels. o The goal for blood sugar control after surgery is 80-180 mg/dL.    The Morning of Surgery  Do not wear jewelry, make-up or nail polish.  Do not wear lotions, powders, or perfumes/colognes, or deodorant  Do not shave 48 hours prior to surgery.  Men may shave face and neck.  Do not bring valuables to the hospital.  [facility] is not responsible for any belongings or valuables.  If you are a smoker, DO NOT Smoke 24 hours prior to surgery  If you wear  a CPAP at night please bring your mask the morning of surgery   Remember that you must have someone to transport you home after your surgery, and remain with you for 24 hours if you are discharged the same day.   Please bring cases for contacts, glasses, hearing aids, dentures or bridgework because it cannot be worn into surgery.    Leave your suitcase in the car.  After surgery it may be brought to your room.  For patients admitted to the hospital, discharge time will be determined by your treatment team.  Patients discharged the day of surgery will not be  allowed to drive home.    Special instructions:   [place]- Preparing For Surgery  Before surgery, you can play an important role. Because skin is not sterile, your skin needs to be as free of germs as possible. You can reduce the number of germs on your skin by washing with CHG (chlorahexidine gluconate) Soap before surgery.  CHG is an antiseptic cleaner which kills germs and bonds with the skin to continue killing germs even after washing.    Oral Hygiene is also important to reduce your risk of infection.  Remember - BRUSH YOUR TEETH THE MORNING OF SURGERY WITH YOUR REGULAR TOOTHPASTE  Please do not use if you have an allergy to CHG or antibacterial soaps. If your skin becomes reddened/irritated stop using the CHG.  Do not shave (including legs and underarms) for at least 48 hours prior to first CHG shower. It is OK to shave your face.  Please follow these instructions carefully.   1. Shower the NIGHT BEFORE SURGERY and the MORNING OF SURGERY with CHG Soap.   2. If you chose to wash your hair, wash your hair first as usual with your normal shampoo.  3. After you shampoo, rinse your hair and body thoroughly to remove the shampoo.  4. Use CHG as you would any other liquid soap. You can apply CHG directly to the skin and wash gently with a scrungie or a clean washcloth.   5. Apply the CHG Soap to your body ONLY FROM THE NECK DOWN.  Do not use on open wounds or open sores. Avoid contact with your eyes, ears, mouth and genitals (private parts). Wash Face and genitals (private parts)  with your normal soap.   6. Wash thoroughly, paying special attention to the area where your surgery will be performed.  7. Thoroughly rinse your body with warm water from the neck down.  8. DO NOT shower/wash with your normal soap after using and rinsing off the CHG Soap.  9. Pat yourself dry with a CLEAN TOWEL.  10. Wear CLEAN PAJAMAS to bed the night before surgery, wear comfortable clothes the  morning of surgery  11. Place CLEAN SHEETS on your bed the night of your first shower and DO NOT SLEEP WITH PETS.    Day of Surgery:  Please shower the morning of surgery with the CHG soap Do not apply any deodorants/lotions. Please wear clean clothes to the hospital/surgery center.   Remember to brush your teeth WITH YOUR REGULAR TOOTHPASTE.   Please read over the following fact sheets that you were given.

## 2019-06-26 ENCOUNTER — Encounter (HOSPITAL_COMMUNITY)
Admission: RE | Admit: 2019-06-26 | Discharge: 2019-06-26 | Disposition: A | Discharge status: Home / Self Care | Payer: Medicare HMO | Source: Ambulatory Visit | Attending: Neurosurgery | Admitting: Neurosurgery

## 2019-06-26 ENCOUNTER — Other Ambulatory Visit: Payer: Self-pay

## 2019-06-26 ENCOUNTER — Encounter (HOSPITAL_COMMUNITY): Payer: Self-pay

## 2019-06-26 ENCOUNTER — Other Ambulatory Visit (HOSPITAL_COMMUNITY)
Admission: RE | Admit: 2019-06-26 | Discharge: 2019-06-26 | Disposition: A | Discharge status: Home / Self Care | Payer: Medicare HMO | Source: Ambulatory Visit | Attending: Neurosurgery | Admitting: Neurosurgery

## 2019-06-26 DIAGNOSIS — Z888 Allergy status to other drugs, medicaments and biological substances status: Secondary | ICD-10-CM | POA: Diagnosis not present

## 2019-06-26 DIAGNOSIS — G894 Chronic pain syndrome: Secondary | ICD-10-CM | POA: Diagnosis not present

## 2019-06-26 DIAGNOSIS — M62838 Other muscle spasm: Secondary | ICD-10-CM | POA: Diagnosis not present

## 2019-06-26 DIAGNOSIS — M532X6 Spinal instabilities, lumbar region: Secondary | ICD-10-CM | POA: Diagnosis not present

## 2019-06-26 DIAGNOSIS — K5909 Other constipation: Secondary | ICD-10-CM | POA: Diagnosis not present

## 2019-06-26 DIAGNOSIS — M5126 Other intervertebral disc displacement, lumbar region: Secondary | ICD-10-CM | POA: Insufficient documentation

## 2019-06-26 DIAGNOSIS — Z91048 Other nonmedicinal substance allergy status: Secondary | ICD-10-CM | POA: Diagnosis not present

## 2019-06-26 DIAGNOSIS — Z9109 Other allergy status, other than to drugs and biological substances: Secondary | ICD-10-CM | POA: Diagnosis not present

## 2019-06-26 DIAGNOSIS — Z20822 Contact with and (suspected) exposure to covid-19: Secondary | ICD-10-CM | POA: Diagnosis not present

## 2019-06-26 DIAGNOSIS — M5116 Intervertebral disc disorders with radiculopathy, lumbar region: Secondary | ICD-10-CM | POA: Diagnosis not present

## 2019-06-26 DIAGNOSIS — Z880 Allergy status to penicillin: Secondary | ICD-10-CM | POA: Diagnosis not present

## 2019-06-26 DIAGNOSIS — Z01818 Encounter for other preprocedural examination: Secondary | ICD-10-CM | POA: Insufficient documentation

## 2019-06-26 DIAGNOSIS — E119 Type 2 diabetes mellitus without complications: Secondary | ICD-10-CM | POA: Insufficient documentation

## 2019-06-26 LAB — BASIC METABOLIC PANEL
Anion gap: 11 (ref 5–15)
BUN: 10 mg/dL (ref 6–20)
CO2: 31 mmol/L (ref 22–32)
Calcium: 9.3 mg/dL (ref 8.9–10.3)
Chloride: 99 mmol/L (ref 98–111)
Creatinine, Ser: 0.88 mg/dL (ref 0.44–1.00)
GFR calc Af Amer: >60 mL/min (ref >60)
GFR calc non Af Amer: >60 mL/min (ref >60)
Glucose, Bld: 81 mg/dL (ref 70–99)
Potassium: 3.4 mmol/L — ABNORMAL LOW (ref 3.5–5.1)
Sodium: 141 mmol/L (ref 135–145)

## 2019-06-26 LAB — CBC
HCT: 43.5 % (ref 36.0–46.0)
Hemoglobin: 14.6 g/dL (ref 12.0–15.0)
MCH: 29.7 pg (ref 26.0–34.0)
MCHC: 33.6 g/dL (ref 30.0–36.0)
MCV: 88.4 fL (ref 80.0–100.0)
Platelets: 253 10*3/uL (ref 150–400)
RBC: 4.92 MIL/uL (ref 3.87–5.11)
RDW: 11.8 % (ref 11.5–15.5)
WBC: 6.4 10*3/uL (ref 4.0–10.5)
nRBC: 0 % (ref 0.0–0.2)

## 2019-06-26 LAB — TYPE AND SCREEN
ABO/RH(D): AB POS
Antibody Screen: NEGATIVE

## 2019-06-26 LAB — SARS CORONAVIRUS 2 (TAT 6-24 HRS): SARS Coronavirus 2: NEGATIVE

## 2019-06-26 LAB — SURGICAL PCR SCREEN
MRSA, PCR: NEGATIVE
Staphylococcus aureus: NEGATIVE

## 2019-06-26 LAB — GLUCOSE, CAPILLARY: Glucose-Capillary: 102 mg/dL — ABNORMAL HIGH (ref 70–99)

## 2019-06-26 MED ORDER — CHLORHEXIDINE GLUCONATE CLOTH 2 % EX PADS: 6.0000 | MEDICATED_PAD | Freq: Once | CUTANEOUS | Status: DC

## 2019-06-26 NOTE — Progress Notes (Signed)
PCP - Shelda Pal Cardiologist - na    Chest x-ray - na EKG -today  Stress Test - na ECHO - na Cardiac Cath - na  Sleep Study - yes CPAP - no  Fasting Blood Sugar - 102 Checks Blood Sugar __0___ times a day   Aspirin Instructions:stop  Allergy to chlorhexidine, pt will use dial soap X 2 COVID TEST- today   Anesthesia review: no  Patient denies shortness of breath, fever, cough and chest pain at PAT appointment   All instructions explained to the patient, with a verbal understanding of the material. Patient agrees to go over the instructions while at home for a better understanding. Patient also instructed to self quarantine after being tested for COVID-19. The opportunity to ask questions was provided.

## 2019-06-27 LAB — HEMOGLOBIN A1C
Hgb A1c MFr Bld: 5.2 % (ref 4.8–5.6)
Mean Plasma Glucose: 103 mg/dL

## 2019-06-28 ENCOUNTER — Inpatient Hospital Stay (HOSPITAL_COMMUNITY): Payer: Medicare HMO | Admitting: Certified Registered Nurse Anesthetist

## 2019-06-28 ENCOUNTER — Encounter (HOSPITAL_COMMUNITY)
Admission: RE | Disposition: A | Discharge status: Home / Self Care | Payer: Self-pay | Source: Home / Self Care | Attending: Neurosurgery

## 2019-06-28 ENCOUNTER — Inpatient Hospital Stay (HOSPITAL_COMMUNITY)
Admission: RE | Admit: 2019-06-28 | Discharge: 2019-06-29 | DRG: 455 | Disposition: A | Discharge status: Home / Self Care | Payer: Medicare HMO | Attending: Neurosurgery | Admitting: Neurosurgery

## 2019-06-28 ENCOUNTER — Other Ambulatory Visit: Payer: Self-pay

## 2019-06-28 ENCOUNTER — Encounter (HOSPITAL_COMMUNITY): Payer: Self-pay | Admitting: Neurosurgery

## 2019-06-28 ENCOUNTER — Inpatient Hospital Stay (HOSPITAL_COMMUNITY): Payer: Medicare HMO

## 2019-06-28 DIAGNOSIS — M62838 Other muscle spasm: Secondary | ICD-10-CM | POA: Diagnosis present

## 2019-06-28 DIAGNOSIS — G43909 Migraine, unspecified, not intractable, without status migrainosus: Secondary | ICD-10-CM | POA: Diagnosis present

## 2019-06-28 DIAGNOSIS — M532X6 Spinal instabilities, lumbar region: Secondary | ICD-10-CM | POA: Diagnosis not present

## 2019-06-28 DIAGNOSIS — Z818 Family history of other mental and behavioral disorders: Secondary | ICD-10-CM

## 2019-06-28 DIAGNOSIS — Z880 Allergy status to penicillin: Secondary | ICD-10-CM

## 2019-06-28 DIAGNOSIS — Z800 Family history of malignant neoplasm of digestive organs: Secondary | ICD-10-CM

## 2019-06-28 DIAGNOSIS — Z9109 Other allergy status, other than to drugs and biological substances: Secondary | ICD-10-CM | POA: Diagnosis not present

## 2019-06-28 DIAGNOSIS — M5116 Intervertebral disc disorders with radiculopathy, lumbar region: Principal | ICD-10-CM | POA: Diagnosis present

## 2019-06-28 DIAGNOSIS — K5909 Other constipation: Secondary | ICD-10-CM | POA: Diagnosis not present

## 2019-06-28 DIAGNOSIS — Z91048 Other nonmedicinal substance allergy status: Secondary | ICD-10-CM | POA: Diagnosis not present

## 2019-06-28 DIAGNOSIS — Z419 Encounter for procedure for purposes other than remedying health state, unspecified: Secondary | ICD-10-CM

## 2019-06-28 DIAGNOSIS — F419 Anxiety disorder, unspecified: Secondary | ICD-10-CM | POA: Diagnosis present

## 2019-06-28 DIAGNOSIS — M5126 Other intervertebral disc displacement, lumbar region: Secondary | ICD-10-CM | POA: Diagnosis present

## 2019-06-28 DIAGNOSIS — Z20822 Contact with and (suspected) exposure to covid-19: Secondary | ICD-10-CM | POA: Diagnosis present

## 2019-06-28 DIAGNOSIS — Z981 Arthrodesis status: Secondary | ICD-10-CM

## 2019-06-28 DIAGNOSIS — Z8614 Personal history of Methicillin resistant Staphylococcus aureus infection: Secondary | ICD-10-CM | POA: Diagnosis not present

## 2019-06-28 DIAGNOSIS — Z79899 Other long term (current) drug therapy: Secondary | ICD-10-CM | POA: Diagnosis not present

## 2019-06-28 DIAGNOSIS — G629 Polyneuropathy, unspecified: Secondary | ICD-10-CM | POA: Diagnosis present

## 2019-06-28 DIAGNOSIS — G894 Chronic pain syndrome: Secondary | ICD-10-CM | POA: Diagnosis present

## 2019-06-28 DIAGNOSIS — F329 Major depressive disorder, single episode, unspecified: Secondary | ICD-10-CM | POA: Diagnosis present

## 2019-06-28 DIAGNOSIS — Z9689 Presence of other specified functional implants: Secondary | ICD-10-CM | POA: Diagnosis present

## 2019-06-28 DIAGNOSIS — T85192A Other mechanical complication of implanted electronic neurostimulator (electrode) of spinal cord, initial encounter: Secondary | ICD-10-CM | POA: Diagnosis not present

## 2019-06-28 DIAGNOSIS — M5136 Other intervertebral disc degeneration, lumbar region: Secondary | ICD-10-CM | POA: Diagnosis not present

## 2019-06-28 DIAGNOSIS — Z888 Allergy status to other drugs, medicaments and biological substances status: Secondary | ICD-10-CM

## 2019-06-28 DIAGNOSIS — E785 Hyperlipidemia, unspecified: Secondary | ICD-10-CM | POA: Diagnosis present

## 2019-06-28 DIAGNOSIS — Z8041 Family history of malignant neoplasm of ovary: Secondary | ICD-10-CM | POA: Diagnosis not present

## 2019-06-28 DIAGNOSIS — M4326 Fusion of spine, lumbar region: Secondary | ICD-10-CM | POA: Diagnosis not present

## 2019-06-28 HISTORY — PX: SPINAL CORD STIMULATOR REMOVAL: SHX5379

## 2019-06-28 LAB — GLUCOSE, CAPILLARY
Glucose-Capillary: 92 mg/dL (ref 70–99)
Glucose-Capillary: 93 mg/dL (ref 70–99)

## 2019-06-28 SURGERY — POSTERIOR LUMBAR FUSION 1 WITH HARDWARE REMOVAL
Anesthesia: General | Site: Back

## 2019-06-28 MED ORDER — PANTOPRAZOLE SODIUM 40 MG IV SOLR: 40.0000 mg | Freq: Every day | INTRAVENOUS | Status: DC

## 2019-06-28 MED ORDER — PANTOPRAZOLE SODIUM 40 MG PO TBEC
40.0000 mg | DELAYED_RELEASE_TABLET | Freq: Every day | ORAL | Status: DC
  Administered 2019-06-28: 40 mg via ORAL
  Filled 2019-06-28: qty 1

## 2019-06-28 MED ORDER — LIDOCAINE 2% (20 MG/ML) 5 ML SYRINGE
INTRAMUSCULAR | Status: AC
  Filled 2019-06-28: qty 5

## 2019-06-28 MED ORDER — HEPARIN SODIUM (PORCINE) 1000 UNIT/ML IJ SOLN
INTRAMUSCULAR | Status: AC
  Filled 2019-06-28: qty 1

## 2019-06-28 MED ORDER — THROMBIN 5000 UNITS EX SOLR
CUTANEOUS | Status: AC
  Filled 2019-06-28: qty 5000

## 2019-06-28 MED ORDER — ACETAMINOPHEN 650 MG RE SUPP: 650.0000 mg | RECTAL | Status: DC | PRN

## 2019-06-28 MED ORDER — EPHEDRINE 5 MG/ML INJ
INTRAVENOUS | Status: AC
  Filled 2019-06-28: qty 10

## 2019-06-28 MED ORDER — VANCOMYCIN HCL IN DEXTROSE 1-5 GM/200ML-% IV SOLN
INTRAVENOUS | Status: AC
  Administered 2019-06-28: 1000 mg via INTRAVENOUS
  Filled 2019-06-28: qty 200

## 2019-06-28 MED ORDER — ONDANSETRON HCL 4 MG/2ML IJ SOLN
INTRAMUSCULAR | Status: AC
  Filled 2019-06-28: qty 4

## 2019-06-28 MED ORDER — LACTATED RINGERS IV SOLN
INTRAVENOUS | Status: DC
  Administered 2019-06-28: 10 mL/h via INTRAVENOUS

## 2019-06-28 MED ORDER — NALOXONE HCL 4 MG/0.1ML NA LIQD: 0.4000 mg | Freq: Once | NASAL | Status: DC

## 2019-06-28 MED ORDER — FENTANYL CITRATE (PF) 250 MCG/5ML IJ SOLN
INTRAMUSCULAR | Status: AC
  Filled 2019-06-28: qty 5

## 2019-06-28 MED ORDER — VANCOMYCIN HCL IN DEXTROSE 1-5 GM/200ML-% IV SOLN: 1000.0000 mg | INTRAVENOUS | Status: AC

## 2019-06-28 MED ORDER — MIDAZOLAM HCL 5 MG/5ML IJ SOLN
INTRAMUSCULAR | Status: DC | PRN
  Administered 2019-06-28: 2 mg via INTRAVENOUS

## 2019-06-28 MED ORDER — LACTATED RINGERS IV SOLN: INTRAVENOUS | Status: DC | PRN

## 2019-06-28 MED ORDER — BUPIVACAINE LIPOSOME 1.3 % IJ SUSP
20.0000 mL | INTRAMUSCULAR | Status: AC
  Administered 2019-06-28: 13:00:00 20 mL
  Filled 2019-06-28: qty 20

## 2019-06-28 MED ORDER — HYDROMORPHONE HCL 1 MG/ML IJ SOLN
INTRAMUSCULAR | Status: AC
  Filled 2019-06-28: qty 1

## 2019-06-28 MED ORDER — SODIUM CHLORIDE 0.9% FLUSH
3.0000 mL | Freq: Two times a day (BID) | INTRAVENOUS | Status: DC
  Administered 2019-06-28: 3 mL via INTRAVENOUS

## 2019-06-28 MED ORDER — VANCOMYCIN HCL 1250 MG/250ML IV SOLN
1250.0000 mg | INTRAVENOUS | Status: DC
  Filled 2019-06-28: qty 250

## 2019-06-28 MED ORDER — ROCURONIUM BROMIDE 50 MG/5ML IV SOSY
PREFILLED_SYRINGE | INTRAVENOUS | Status: DC | PRN
  Administered 2019-06-28: 65 mg via INTRAVENOUS
  Administered 2019-06-28: 10 mg via INTRAVENOUS
  Administered 2019-06-28: 25 mg via INTRAVENOUS

## 2019-06-28 MED ORDER — HYDROMORPHONE HCL 1 MG/ML IJ SOLN
0.2500 mg | INTRAMUSCULAR | Status: DC | PRN
  Administered 2019-06-28: 0.5 mg via INTRAVENOUS

## 2019-06-28 MED ORDER — ONDANSETRON HCL 4 MG/2ML IJ SOLN
INTRAMUSCULAR | Status: DC | PRN
  Administered 2019-06-28: 4 mg via INTRAVENOUS

## 2019-06-28 MED ORDER — PROMETHAZINE HCL 25 MG/ML IJ SOLN: 6.2500 mg | INTRAMUSCULAR | Status: DC | PRN

## 2019-06-28 MED ORDER — DIPHENHYDRAMINE HCL 50 MG/ML IJ SOLN
INTRAMUSCULAR | Status: DC | PRN
  Administered 2019-06-28: 12.5 mg via INTRAVENOUS

## 2019-06-28 MED ORDER — ONDANSETRON HCL 4 MG/2ML IJ SOLN
INTRAMUSCULAR | Status: AC
  Filled 2019-06-28: qty 2

## 2019-06-28 MED ORDER — ONDANSETRON HCL 4 MG PO TABS: 4.0000 mg | ORAL_TABLET | Freq: Four times a day (QID) | ORAL | Status: DC | PRN

## 2019-06-28 MED ORDER — CYCLOBENZAPRINE HCL 10 MG PO TABS: 10.0000 mg | ORAL_TABLET | Freq: Three times a day (TID) | ORAL | Status: DC | PRN

## 2019-06-28 MED ORDER — DEXAMETHASONE SODIUM PHOSPHATE 10 MG/ML IJ SOLN
10.0000 mg | Freq: Once | INTRAMUSCULAR | Status: AC
  Administered 2019-06-28: 10 mg via INTRAVENOUS

## 2019-06-28 MED ORDER — OXYCODONE HCL 5 MG PO TABS
10.0000 mg | ORAL_TABLET | ORAL | Status: DC | PRN
  Administered 2019-06-28 – 2019-06-29 (×3): 10 mg via ORAL
  Filled 2019-06-28 (×4): qty 2

## 2019-06-28 MED ORDER — ROCURONIUM BROMIDE 10 MG/ML (PF) SYRINGE
PREFILLED_SYRINGE | INTRAVENOUS | Status: AC
  Filled 2019-06-28: qty 20

## 2019-06-28 MED ORDER — ACETAMINOPHEN 325 MG PO TABS
650.0000 mg | ORAL_TABLET | ORAL | Status: DC | PRN
  Administered 2019-06-28: 650 mg via ORAL
  Filled 2019-06-28 (×2): qty 2

## 2019-06-28 MED ORDER — SEMAGLUTIDE (1 MG/DOSE) 2 MG/1.5ML ~~LOC~~ SOPN: 1.0000 mg | PEN_INJECTOR | SUBCUTANEOUS | Status: DC

## 2019-06-28 MED ORDER — LIDOCAINE 2% (20 MG/ML) 5 ML SYRINGE
INTRAMUSCULAR | Status: DC | PRN
  Administered 2019-06-28: 40 mg via INTRAVENOUS

## 2019-06-28 MED ORDER — 0.9 % SODIUM CHLORIDE (POUR BTL) OPTIME
TOPICAL | Status: DC | PRN
  Administered 2019-06-28: 1000 mL

## 2019-06-28 MED ORDER — FUROSEMIDE 40 MG PO TABS
40.0000 mg | ORAL_TABLET | Freq: Every day | ORAL | Status: DC
  Administered 2019-06-29: 40 mg via ORAL
  Filled 2019-06-28: qty 1

## 2019-06-28 MED ORDER — PRAMIPEXOLE DIHYDROCHLORIDE 0.25 MG PO TABS
0.5000 mg | ORAL_TABLET | Freq: Every day | ORAL | Status: DC
  Administered 2019-06-28: 0.5 mg via ORAL
  Filled 2019-06-28: qty 2

## 2019-06-28 MED ORDER — SUGAMMADEX SODIUM 200 MG/2ML IV SOLN
INTRAVENOUS | Status: DC | PRN
  Administered 2019-06-28: 200 mg via INTRAVENOUS

## 2019-06-28 MED ORDER — KETAMINE HCL 50 MG/5ML IJ SOSY
PREFILLED_SYRINGE | INTRAMUSCULAR | Status: AC
  Filled 2019-06-28: qty 5

## 2019-06-28 MED ORDER — ONDANSETRON HCL 4 MG/2ML IJ SOLN: 4.0000 mg | Freq: Four times a day (QID) | INTRAMUSCULAR | Status: DC | PRN

## 2019-06-28 MED ORDER — LIDOCAINE-EPINEPHRINE 1 %-1:100000 IJ SOLN
INTRAMUSCULAR | Status: DC | PRN
  Administered 2019-06-28: 10 mL

## 2019-06-28 MED ORDER — METHADONE HCL 5 MG PO TABS
2.5000 mg | ORAL_TABLET | Freq: Every day | ORAL | Status: DC
  Administered 2019-06-29: 2.5 mg via ORAL
  Filled 2019-06-28: qty 1

## 2019-06-28 MED ORDER — MONTELUKAST SODIUM 10 MG PO TABS
10.0000 mg | ORAL_TABLET | Freq: Every day | ORAL | Status: DC
  Administered 2019-06-28: 10 mg via ORAL
  Filled 2019-06-28 (×2): qty 1

## 2019-06-28 MED ORDER — DULOXETINE HCL 30 MG PO CPEP
120.0000 mg | ORAL_CAPSULE | Freq: Every day | ORAL | Status: DC
  Administered 2019-06-29: 120 mg via ORAL
  Filled 2019-06-28: qty 4

## 2019-06-28 MED ORDER — PHENYLEPHRINE 40 MCG/ML (10ML) SYRINGE FOR IV PUSH (FOR BLOOD PRESSURE SUPPORT)
PREFILLED_SYRINGE | INTRAVENOUS | Status: AC
  Filled 2019-06-28: qty 10

## 2019-06-28 MED ORDER — PROPOFOL 500 MG/50ML IV EMUL
INTRAVENOUS | Status: DC | PRN
  Administered 2019-06-28: 25 ug/kg/min via INTRAVENOUS

## 2019-06-28 MED ORDER — POLYETHYLENE GLYCOL 3350 17 G PO PACK: 17.0000 g | PACK | Freq: Every day | ORAL | Status: DC | PRN

## 2019-06-28 MED ORDER — TIZANIDINE HCL 4 MG PO TABS
8.0000 mg | ORAL_TABLET | Freq: Every day | ORAL | Status: DC
  Administered 2019-06-28: 8 mg via ORAL
  Filled 2019-06-28: qty 2

## 2019-06-28 MED ORDER — PROPOFOL 10 MG/ML IV BOLUS
INTRAVENOUS | Status: AC
  Filled 2019-06-28: qty 20

## 2019-06-28 MED ORDER — SIMVASTATIN 20 MG PO TABS
20.0000 mg | ORAL_TABLET | Freq: Every day | ORAL | Status: DC
  Administered 2019-06-28: 20 mg via ORAL
  Filled 2019-06-28: qty 1

## 2019-06-28 MED ORDER — GLYCOPYRROLATE PF 0.2 MG/ML IJ SOSY
PREFILLED_SYRINGE | INTRAMUSCULAR | Status: AC
  Filled 2019-06-28: qty 2

## 2019-06-28 MED ORDER — PHENYLEPHRINE HCL (PRESSORS) 10 MG/ML IV SOLN
INTRAVENOUS | Status: DC | PRN
  Administered 2019-06-28 (×6): 80 ug via INTRAVENOUS

## 2019-06-28 MED ORDER — BUSPIRONE HCL 10 MG PO TABS
10.0000 mg | ORAL_TABLET | Freq: Three times a day (TID) | ORAL | Status: DC | PRN
  Filled 2019-06-28: qty 1

## 2019-06-28 MED ORDER — DIPHENHYDRAMINE HCL 50 MG/ML IJ SOLN
INTRAMUSCULAR | Status: AC
  Filled 2019-06-28: qty 1

## 2019-06-28 MED ORDER — FENTANYL CITRATE (PF) 100 MCG/2ML IJ SOLN
INTRAMUSCULAR | Status: DC | PRN
  Administered 2019-06-28: 150 ug via INTRAVENOUS
  Administered 2019-06-28 (×2): 50 ug via INTRAVENOUS

## 2019-06-28 MED ORDER — ACETAMINOPHEN 10 MG/ML IV SOLN
INTRAVENOUS | Status: DC | PRN
  Administered 2019-06-28: 1000 mg via INTRAVENOUS

## 2019-06-28 MED ORDER — THROMBIN 20000 UNITS EX SOLR
CUTANEOUS | Status: AC
  Filled 2019-06-28: qty 20000

## 2019-06-28 MED ORDER — SODIUM CHLORIDE 0.9% FLUSH: 3.0000 mL | INTRAVENOUS | Status: DC | PRN

## 2019-06-28 MED ORDER — HYDROMORPHONE HCL 1 MG/ML IJ SOLN
1.0000 mg | INTRAMUSCULAR | Status: DC | PRN
  Administered 2019-06-28: 1 mg via INTRAVENOUS
  Filled 2019-06-28: qty 1

## 2019-06-28 MED ORDER — TRAMADOL HCL 50 MG PO TABS: 50.0000 mg | ORAL_TABLET | Freq: Three times a day (TID) | ORAL | Status: DC | PRN

## 2019-06-28 MED ORDER — MEPERIDINE HCL 25 MG/ML IJ SOLN: 6.2500 mg | INTRAMUSCULAR | Status: DC | PRN

## 2019-06-28 MED ORDER — PHENYLEPHRINE HCL-NACL 10-0.9 MG/250ML-% IV SOLN
INTRAVENOUS | Status: DC | PRN
  Administered 2019-06-28: 35 ug/min via INTRAVENOUS

## 2019-06-28 MED ORDER — LIDOCAINE-EPINEPHRINE 1 %-1:100000 IJ SOLN
INTRAMUSCULAR | Status: AC
  Filled 2019-06-28: qty 1

## 2019-06-28 MED ORDER — DEXAMETHASONE SODIUM PHOSPHATE 10 MG/ML IJ SOLN
INTRAMUSCULAR | Status: AC
  Filled 2019-06-28: qty 2

## 2019-06-28 MED ORDER — MENTHOL 3 MG MT LOZG: 1.0000 | LOZENGE | OROMUCOSAL | Status: DC | PRN

## 2019-06-28 MED ORDER — ACETAMINOPHEN 10 MG/ML IV SOLN
INTRAVENOUS | Status: AC
  Filled 2019-06-28: qty 100

## 2019-06-28 MED ORDER — THROMBIN 20000 UNITS EX SOLR
CUTANEOUS | Status: DC | PRN
  Administered 2019-06-28: 11:00:00 20 mL via TOPICAL

## 2019-06-28 MED ORDER — DEXAMETHASONE SODIUM PHOSPHATE 10 MG/ML IJ SOLN
INTRAMUSCULAR | Status: AC
  Filled 2019-06-28: qty 1

## 2019-06-28 MED ORDER — LABETALOL HCL 5 MG/ML IV SOLN
INTRAVENOUS | Status: AC
  Filled 2019-06-28: qty 4

## 2019-06-28 MED ORDER — MIDAZOLAM HCL 2 MG/2ML IJ SOLN: 0.5000 mg | Freq: Once | INTRAMUSCULAR | Status: DC | PRN

## 2019-06-28 MED ORDER — PROPOFOL 10 MG/ML IV BOLUS
INTRAVENOUS | Status: DC | PRN
  Administered 2019-06-28: 150 mg via INTRAVENOUS

## 2019-06-28 MED ORDER — SODIUM CHLORIDE 0.9 % IV SOLN
INTRAVENOUS | Status: DC | PRN
  Administered 2019-06-28: 500 mL

## 2019-06-28 MED ORDER — SCOPOLAMINE 1 MG/3DAYS TD PT72
1.0000 | MEDICATED_PATCH | Freq: Once | TRANSDERMAL | Status: AC
  Administered 2019-06-28: 10:00:00 1 via TRANSDERMAL
  Filled 2019-06-28: qty 1

## 2019-06-28 MED ORDER — SODIUM CHLORIDE 0.9 % IV SOLN: 250.0000 mL | INTRAVENOUS | Status: DC

## 2019-06-28 MED ORDER — PHENOL 1.4 % MT LIQD: 1.0000 | OROMUCOSAL | Status: DC | PRN

## 2019-06-28 MED ORDER — ALUM & MAG HYDROXIDE-SIMETH 200-200-20 MG/5ML PO SUSP: 30.0000 mL | Freq: Four times a day (QID) | ORAL | Status: DC | PRN

## 2019-06-28 MED ORDER — CELECOXIB 200 MG PO CAPS
200.0000 mg | ORAL_CAPSULE | Freq: Every day | ORAL | Status: DC
  Administered 2019-06-28 – 2019-06-29 (×2): 200 mg via ORAL
  Filled 2019-06-28 (×2): qty 1

## 2019-06-28 MED ORDER — MIDAZOLAM HCL 2 MG/2ML IJ SOLN
INTRAMUSCULAR | Status: AC
  Filled 2019-06-28: qty 2

## 2019-06-28 MED ORDER — KETAMINE HCL 10 MG/ML IJ SOLN
INTRAMUSCULAR | Status: DC | PRN
  Administered 2019-06-28: 20 mg via INTRAVENOUS
  Administered 2019-06-28: 10 mg via INTRAVENOUS
  Administered 2019-06-28: 20 mg via INTRAVENOUS

## 2019-06-28 SURGICAL SUPPLY — 77 items
ADH SKN CLS APL DERMABOND .7 (GAUZE/BANDAGES/DRESSINGS) ×4
APL SKNCLS STERI-STRIP NONHPOA (GAUZE/BANDAGES/DRESSINGS) ×2
BAG DECANTER FOR FLEXI CONT (MISCELLANEOUS) ×3 IMPLANT
BENZOIN TINCTURE PRP APPL 2/3 (GAUZE/BANDAGES/DRESSINGS) ×3 IMPLANT
BLADE CLIPPER SURG (BLADE) IMPLANT
BLADE SURG 11 STRL SS (BLADE) ×3 IMPLANT
BUR CUTTER 7.0 ROUND (BURR) ×3 IMPLANT
BUR MATCHSTICK NEURO 3.0 LAGG (BURR) ×3 IMPLANT
CANISTER SUCT 3000ML PPV (MISCELLANEOUS) ×3 IMPLANT
CARTRIDGE OIL MAESTRO DRILL (MISCELLANEOUS) ×2 IMPLANT
CNTNR URN SCR LID CUP LEK RST (MISCELLANEOUS) ×2 IMPLANT
CONT SPEC 4OZ STRL OR WHT (MISCELLANEOUS) ×3
COVER BACK TABLE 60X90IN (DRAPES) ×3 IMPLANT
COVER WAND RF STERILE (DRAPES) ×3 IMPLANT
DECANTER SPIKE VIAL GLASS SM (MISCELLANEOUS) ×3 IMPLANT
DERMABOND ADVANCED (GAUZE/BANDAGES/DRESSINGS) ×2
DERMABOND ADVANCED .7 DNX12 (GAUZE/BANDAGES/DRESSINGS) ×2 IMPLANT
DIFFUSER DRILL AIR PNEUMATIC (MISCELLANEOUS) ×3 IMPLANT
DRAPE C-ARM 42X72 X-RAY (DRAPES) ×6 IMPLANT
DRAPE C-ARMOR (DRAPES) IMPLANT
DRAPE HALF SHEET 40X57 (DRAPES) IMPLANT
DRAPE LAPAROTOMY 100X72X124 (DRAPES) ×3 IMPLANT
DRAPE SURG 17X23 STRL (DRAPES) ×3 IMPLANT
DRSG OPSITE 4X5.5 SM (GAUZE/BANDAGES/DRESSINGS) ×2 IMPLANT
DRSG OPSITE POSTOP 4X6 (GAUZE/BANDAGES/DRESSINGS) ×2 IMPLANT
DURAPREP 26ML APPLICATOR (WOUND CARE) ×3 IMPLANT
ELECT REM PT RETURN 9FT ADLT (ELECTROSURGICAL) ×3
ELECTRODE REM PT RTRN 9FT ADLT (ELECTROSURGICAL) ×2 IMPLANT
EVACUATOR 3/16  PVC DRAIN (DRAIN) ×1
EVACUATOR 3/16 PVC DRAIN (DRAIN) ×2 IMPLANT
GAUZE 4X4 16PLY RFD (DISPOSABLE) ×1 IMPLANT
GAUZE SPONGE 4X4 12PLY STRL (GAUZE/BANDAGES/DRESSINGS) ×4 IMPLANT
GLOVE BIO SURGEON STRL SZ7 (GLOVE) ×1 IMPLANT
GLOVE BIO SURGEON STRL SZ8 (GLOVE) ×6 IMPLANT
GLOVE BIOGEL PI IND STRL 7.0 (GLOVE) IMPLANT
GLOVE BIOGEL PI INDICATOR 7.0 (GLOVE) ×3
GLOVE EXAM NITRILE XL STR (GLOVE) IMPLANT
GLOVE INDICATOR 8.5 STRL (GLOVE) ×6 IMPLANT
GLOVE SS BIOGEL STRL SZ 7.5 (GLOVE) IMPLANT
GLOVE SUPERSENSE BIOGEL SZ 7.5 (GLOVE) ×5
GOWN STRL REUS W/ TWL LRG LVL3 (GOWN DISPOSABLE) IMPLANT
GOWN STRL REUS W/ TWL XL LVL3 (GOWN DISPOSABLE) ×4 IMPLANT
GOWN STRL REUS W/TWL 2XL LVL3 (GOWN DISPOSABLE) IMPLANT
GOWN STRL REUS W/TWL LRG LVL3 (GOWN DISPOSABLE) ×3
GOWN STRL REUS W/TWL XL LVL3 (GOWN DISPOSABLE) ×12
HEMOSTAT POWDER KIT SURGIFOAM (HEMOSTASIS) IMPLANT
KIT BASIN OR (CUSTOM PROCEDURE TRAY) ×3 IMPLANT
KIT TURNOVER KIT B (KITS) ×3 IMPLANT
MILL MEDIUM DISP (BLADE) ×3 IMPLANT
NDL HYPO 21X1.5 SAFETY (NEEDLE) ×2 IMPLANT
NDL HYPO 25X1 1.5 SAFETY (NEEDLE) ×2 IMPLANT
NEEDLE HYPO 21X1.5 SAFETY (NEEDLE) ×3 IMPLANT
NEEDLE HYPO 25X1 1.5 SAFETY (NEEDLE) ×3 IMPLANT
NS IRRIG 1000ML POUR BTL (IV SOLUTION) ×3 IMPLANT
OIL CARTRIDGE MAESTRO DRILL (MISCELLANEOUS) ×3
PACK LAMINECTOMY NEURO (CUSTOM PROCEDURE TRAY) ×3 IMPLANT
PAD ARMBOARD 7.5X6 YLW CONV (MISCELLANEOUS) ×6 IMPLANT
PUTTY BONE DBX 5CC MIX (Putty) ×1 IMPLANT
ROD PREBENT 6.35X50 (Rod) ×1 IMPLANT
ROD PREBENT 6.35X60 (Rod) ×1 IMPLANT
SCREW PEDICLE VA L635 6.5X45M (Screw) ×2 IMPLANT
SCREW SET NON BREAK OFF (Screw) ×4 IMPLANT
SPACER SUSTAIN TI 9X22X12 8D (Spacer) ×2 IMPLANT
SPONGE LAP 4X18 RFD (DISPOSABLE) IMPLANT
SPONGE SURGIFOAM ABS GEL 100 (HEMOSTASIS) ×3 IMPLANT
STRIP CLOSURE SKIN 1/2X4 (GAUZE/BANDAGES/DRESSINGS) ×4 IMPLANT
SUT VIC AB 0 CT1 18XCR BRD8 (SUTURE) ×4 IMPLANT
SUT VIC AB 0 CT1 8-18 (SUTURE) ×3
SUT VIC AB 2-0 CT1 18 (SUTURE) ×3 IMPLANT
SUT VIC AB 4-0 PS2 27 (SUTURE) ×3 IMPLANT
SYR 20ML LL LF (SYRINGE) ×1 IMPLANT
TAPE PAPER 3X10 WHT MICROPORE (GAUZE/BANDAGES/DRESSINGS) ×2 IMPLANT
TOWEL GREEN STERILE (TOWEL DISPOSABLE) ×3 IMPLANT
TOWEL GREEN STERILE FF (TOWEL DISPOSABLE) ×3 IMPLANT
TRAY FOLEY MTR SLVR 14FR STAT (SET/KITS/TRAYS/PACK) ×1 IMPLANT
TRAY FOLEY MTR SLVR 16FR STAT (SET/KITS/TRAYS/PACK) ×2 IMPLANT
WATER STERILE IRR 1000ML POUR (IV SOLUTION) ×3 IMPLANT

## 2019-06-28 NOTE — Anesthesia Preprocedure Evaluation (Addendum)
Anesthesia Evaluation  Patient identified by MRN, date of birth, ID band Patient awake    Reviewed: Allergy & Precautions, NPO status , Patient's Chart, lab work & pertinent test results  History of Anesthesia Complications (+) PONV  Airway Mallampati: I  TM Distance: >3 FB Neck ROM: Full    Dental  (+) Chipped, Dental Advisory Given   Pulmonary neg pulmonary ROS,  06/26/2019 SARS coronavirus NEG   breath sounds clear to auscultation       Cardiovascular negative cardio ROS   Rhythm:Regular Rate:Normal     Neuro/Psych  Headaches, Anxiety Depression Chiari malformation: VP shunt Chronic back pain    GI/Hepatic GERD  Controlled,(+)     substance abuse  ,   Endo/Other  Morbid obesity  Renal/GU negative Renal ROS     Musculoskeletal  (+) Arthritis , Fibromyalgia -, narcotic dependent  Abdominal (+) + obese,   Peds  Hematology negative hematology ROS (+)   Anesthesia Other Findings   Reproductive/Obstetrics S/p hysterectomy                           Anesthesia Physical Anesthesia Plan  ASA: III  Anesthesia Plan: General   Post-op Pain Management:    Induction: Intravenous  PONV Risk Score and Plan: 4 or greater and Scopolamine patch - Pre-op, Dexamethasone and Ondansetron  Airway Management Planned: Oral ETT  Additional Equipment:   Intra-op Plan:   Post-operative Plan: Extubation in OR  Informed Consent: I have reviewed the patients History and Physical, chart, labs and discussed the procedure including the risks, benefits and alternatives for the proposed anesthesia with the patient or authorized representative who has indicated his/her understanding and acceptance.     Dental advisory given  Plan Discussed with: CRNA and Surgeon  Anesthesia Plan Comments:        Anesthesia Quick Evaluation

## 2019-06-28 NOTE — Transfer of Care (Signed)
Immediate Anesthesia Transfer of Care Note  Patient: Katrina Fernandez  Procedure(s) Performed: Posterior Lumbar Interbody Fusion - Lumbar three-Lumbar four removal of hardware Lumbar four-Sacral one Interbody Fusion (N/A Back) Removal of dorsal column stimulator and generator - (N/A )  Patient Location: PACU  Anesthesia Type:General  Level of Consciousness: awake  Airway & Oxygen Therapy: Patient Spontanous Breathing and Patient connected to nasal cannula oxygen  Post-op Assessment: Report given to RN, Post -op Vital signs reviewed and stable and Patient moving all extremities X 4  Post vital signs: Reviewed and stable  Last Vitals:  Vitals Value Taken Time  BP 121/84 06/28/19 1400  Temp 36.7 C 06/28/19 1400  Pulse 98 06/28/19 1404  Resp 18 06/28/19 1404  SpO2 100 % 06/28/19 1404  Vitals shown include unvalidated device data.  Last Pain:  Vitals:   06/28/19 1400  TempSrc:   PainSc: Asleep      Patients Stated Pain Goal: 3 ([id number])  Complications: No apparent anesthesia complications

## 2019-06-28 NOTE — H&P (Signed)
Katrina Fernandez is an 54 y.o. female.   Chief Complaint: Back left greater than right leg pain HPI: 54 year old female status post L4-S1 fusion presents with progressive increased back pain with left L3-L4 radiculopathy greater than right patient undergone extraforaminal discectomy few months ago with no significant benefit.  Work-up has revealed progressive deterioration degeneration at L3-4 above her old fusion.  And due to her progression of clinical syndrome imaging findings and failed conservative treatment I recommended reexploration fusion removal of hardware L4-S1 posterior lumbar interbody fusion L3-4 with removal as requested of dorsal column stimulator.  We have extensively gone over the risks and benefits of that procedure with her as well as perioperative course expectations of outcome and alternatives of surgery and she understands and agrees to proceed forward.  Past Medical History:  Diagnosis Date  . Anemia   . Anxiety   . Arthritis    "back and knees" (07/23/2016)  . Chiari malformation type I ([place]) dx'd 2014  . Chronic back pain    buldging, DDD; "neck and lower back" (07/23/2016)  . Chronic constipation    takes Miralax daily  . Chronic pain syndrome    takes Methadone and Keppra daily  . Depression    takes Effexor daily  . Endometriosis    1995  . Fibromyalgia   . GERD (gastroesophageal reflux disease)    not taking anything at the present time  . History of DVT of lower extremity 07/2012   LEFT LEG --  3/ 2014.Was on a blood transfusion for 6 months  . History of kidney stones   . History of MRSA infection 10 yrs ago  . Hyperlipidemia    takes Simvastatin daily  . Migraine    "daily recently" (07/23/2016)  . Muscle spasm    takes Zanaflex nightly  . Peripheral edema    takes Lasix daily as needed  . Peripheral neuropathy   . Pleurisy 2000  . Pneumonia    hx of-7-8 yrs ago  . PONV (postoperative nausea and vomiting)   . Seasonal allergies    takes Claritin  daily  . Spinal headache    "since 2002; still get them; try to manage it w/lying flat"  . Weakness    and numbness in right hand    Past Surgical History:  Procedure Laterality Date  . ANTERIOR CERVICAL DECOMP/DISCECTOMY FUSION  07-25-2009   C5 -- C6  . APPENDECTOMY  age 20  . APPLICATION OF CRANIAL NAVIGATION N/A 06/23/2017   Procedure: APPLICATION OF CRANIAL NAVIGATION;  Surgeon: Kary Kos, MD;  Location: [facility];  Service: Neurosurgery;  Laterality: N/A;  . BACK SURGERY    . BRAIN SURGERY     VP shunt  . BREAST REDUCTION SURGERY Bilateral 06/07/2013   Procedure: BILATERAL MAMMARY REDUCTION  (BREAST);  Surgeon: Theodoro Kos, DO;  Location: [facility];  Service: Plastics;  Laterality: Bilateral;  . BREAST REDUCTION SURGERY Bilateral 06/07/2013   Procedure:  LIPOSUCTION;  Surgeon: Theodoro Kos, DO;  Location: [facility];  Service: Plastics;  Laterality: Bilateral;  . CARPAL TUNNEL RELEASE Bilateral 2001  . COLONOSCOPY  X 2   "polyps were benign"  . DILATION AND CURETTAGE OF UTERUS    . ESOPHAGOGASTRODUODENOSCOPY    . gastric banding undone  11/2014  . LAPAROSCOPIC CHOLECYSTECTOMY  ~ 2000  . LAPAROSCOPIC GASTRIC BANDING  2011  . LAPAROSCOPIC REVISION VENTRICULAR-PERITONEAL (V-P) SHUNT N/A 06/23/2017   Procedure: Shunt Placment stereotactic ventricular catheter placement and general surgery abdominal  exposure  (Laparoscopic vs. Open) Brain Lab;  Surgeon: Clovis Riley, MD;  Location: [facility];  Service: General;  Laterality: N/A;  . LAPAROSCOPY N/A 06/23/2017   Procedure: LAPAROSCOPY DIAGNOSTIC;  Surgeon: Clovis Riley, MD;  Location: [facility];  Service: General;  Laterality: N/A;  . LUMBAR LAMINECTOMY/DECOMPRESSION MICRODISCECTOMY N/A 07/23/2016   Procedure: Decompressive Lumbar Laminectomy Lumbar two-three  for Repair of  Cerberal Spinal Fluid  Leak;  Surgeon: Kary Kos, MD;  Location: [facility];  Service: Neurosurgery;  Laterality: N/A;  . LUMBAR  LAMINECTOMY/DECOMPRESSION MICRODISCECTOMY Left 03/01/2019   Procedure: LEFT LUMBAR THREE-FOUR EXTRAFORAMINAL MICRODISCECTOMY;  Surgeon: Kary Kos, MD;  Location: [facility];  Service: Neurosurgery;  Laterality: Left;  . PLACEMENT LUMBOPERITONEAL SHUNT FOR CEREBROSPINAL FLUID DIVERSION AWAY FROM C8 PERINEURAL CYST  09-05-2003  &  12-24-2003   08-31-2003  REMOVAL SHUNT  . POSTERIOR CERVICAL LAMINECTOMY  03-01-2001   C7-8 and Exploration C8 perineural cyst  . POSTERIOR LUMBAR LAMINECTOMY L4-5/  DISKECTOMY L5-S1  05-20-2005  . RE-DO DECOMPRESSION LAMINECTOMY AND FUSION L4-5  &  L5-S1  12-20-2006   02-04-2007--  RE-EXPLORATION L4 to S1, I & D LUMBAR WOUND HEMATOMA  . REDUCTION MAMMAPLASTY    . SHUNT REMOVAL N/A 07/10/2016   Procedure: SHUNT REMOVAL;  Surgeon: Kary Kos, MD;  Location: [facility];  Service: Neurosurgery;  Laterality: N/A;  SHUNT REMOVAL  . SPINAL CORD STIMULATOR INSERTION N/A 04/26/2015   Procedure: LUMBAR SPINAL CORD STIMULATOR INSERTION;  Surgeon: Clydell Hakim, MD;  Location: [facility];  Service: Neurosurgery;  Laterality: N/A;  LUMBAR SPINAL CORD STIMULATOR INSERTION  . TENNIS ELBOW RELEASE/NIRSCHEL PROCEDURE Left 12/13/2014   Procedure: LEFT ELBOW LATERAL EPICONDYLAR DEBRIDEMENT AND OR REPAIR -RECONSTRUCTION ;  Surgeon: Iran Planas, MD;  Location: [facility];  Service: Orthopedics;  Laterality: Left;  . TOTAL ABDOMINAL HYSTERECTOMY  1994   w/  Bilateral Salpinoophorectomy  . VENTRICULOPERITONEAL SHUNT N/A 06/23/2017   Procedure: VENTRICULAR-PERITONEAL SHUNT INSERTION WITH ABDOMINAL LAPARASCOPY AND BRAINLAB NAVIGATION;  Surgeon: Kary Kos, MD;  Location: [facility];  Service: Neurosurgery;  Laterality: N/A;    Family History  Problem Relation Age of Onset  . Depression Other   . Colon cancer Father   . Ovarian cancer Maternal Grandmother   . Pancreatic cancer Maternal Grandfather    Social History:  reports that she has never smoked. She has never used smokeless tobacco.  She reports that she does not drink alcohol or use drugs.  Allergies:  Allergies  Allergen Reactions  . Penicillins Anaphylaxis    Has patient had a PCN reaction causing immediate rash, facial/tongue/throat swelling, SOB or lightheadedness with hypotension: Yes Has patient had a PCN reaction causing severe rash involving mucus membranes or skin necrosis: No Has patient had a PCN reaction that required hospitalization No Has patient had a PCN reaction occurring within the last 10 years: No If all of the above answers are "NO", then may proceed with Cephalosporin use.   . Lactose Nausea And Vomiting and Swelling       . Chlorhexidine Rash and Other (See Comments)    Dermatitis - Allergic  . Tape Rash    Plastic tape, silicone tape, Steri Strips    Medications Prior to Admission  Medication Sig Dispense Refill  . busPIRone (BUSPAR) 10 MG tablet TAKE 1 TABLET BY MOUTH THREE TIMES A DAY (Patient taking differently: Take 10 mg by mouth 3 (three) times daily as needed (anxiety). ) 270 tablet 1  . celecoxib (CELEBREX) 200 MG  capsule Take 200 mg by mouth daily.    . DULoxetine (CYMBALTA) 60 MG capsule TAKE 2 CAPSULES BY MOUTH EVERY DAY (Patient taking differently: Take 120 mg by mouth daily. ) 180 capsule 1  . furosemide (LASIX) 40 MG tablet TAKE 1 TABLET BY MOUTH TWICE A DAY (Patient taking differently: Take 40 mg by mouth daily. ) 180 tablet 1  . methadone (DOLOPHINE) 5 MG tablet Take 2.5 mg by mouth daily.   0  . montelukast (SINGULAIR) 10 MG tablet TAKE 1 TABLET BY MOUTH EVERYDAY AT BEDTIME (Patient taking differently: Take 10 mg by mouth at bedtime. ) 90 tablet 1  . OZEMPIC, 1 MG/DOSE, 2 MG/1.5ML SOPN INJECT 1 MG INTO THE SKIN ONCE A WEEK. (Patient taking differently: Inject 1 mg into the skin once a week. Friday) 9 pen 1  . polyethylene glycol (MIRALAX / GLYCOLAX) 17 g packet Take 17 g by mouth daily as needed for mild constipation or moderate constipation.    . pramipexole (MIRAPEX) 0.5  MG tablet Take 1 tablet (0.5 mg total) by mouth at bedtime. (Patient taking differently: Take 0.5 mg by mouth at bedtime. Must be taken before by 18:30) 90 tablet 3  . simvastatin (ZOCOR) 20 MG tablet TAKE 1 TABLET BY MOUTH EVERY DAY (Patient taking differently: Take 20 mg by mouth at bedtime. ) 90 tablet 2  . tiZANidine (ZANAFLEX) 4 MG capsule Take 8 mg by mouth at bedtime. Take an additional 4 mg as needed for muscle spasm    . traMADol (ULTRAM) 50 MG tablet Take 50-100 mg by mouth every 8 (eight) hours as needed (Pain).     Marland Kitchen diclofenac sodium (VOLTAREN) 1 % GEL Apply 4 g topically 4 (four) times daily. To affected joint. (Patient taking differently: Apply 4 g topically as needed (pain). To affected joint.) 100 g 11  . NARCAN 4 MG/0.1ML LIQD nasal spray kit Place 0.4 mg into the nose once.       Results for orders placed or performed during the hospital encounter of 06/26/19 (from the past 48 hour(s))  SARS CORONAVIRUS 2 (TAT 6-24 HRS) Nasopharyngeal Nasopharyngeal Swab     Status: None   Collection Time: 06/26/19 10:00 AM   Specimen: Nasopharyngeal Swab  Result Value Ref Range   SARS Coronavirus 2 NEGATIVE NEGATIVE    Comment: (NOTE) SARS-CoV-2 target nucleic acids are NOT DETECTED. The SARS-CoV-2 RNA is generally detectable in upper and lower respiratory specimens during the acute phase of infection. Negative results do not preclude SARS-CoV-2 infection, do not rule out co-infections with other pathogens, and should not be used as the sole basis for treatment or other patient management decisions. Negative results must be combined with clinical observations, patient history, and epidemiological information. The expected result is Negative. Fact Sheet for Patients: SugarRoll.be Fact Sheet for Healthcare Providers: https://www.woods-mathews.com/ This test is not yet approved or cleared by the [place] FDA and  has been authorized for  detection and/or diagnosis of SARS-CoV-2 by FDA under an Emergency Use Authorization (EUA). This EUA will remain  in effect (meaning this test can be used) for the duration of the COVID-19 declaration under Section 56 4(b)(1) of the Act, 21 U.S.C. section 360bbb-3(b)(1), unless the authorization is terminated or revoked sooner. Performed at [facility], [place] [address], [place], [place] [address]    No results found.  Review of Systems  Musculoskeletal: Positive for back pain and joint swelling.  Neurological: Positive for weakness and numbness.    Blood  pressure (!) 126/92, pulse 84, temperature 98.2 F (36.8 C), temperature source Oral, resp. rate 18, height _0  (1.6 m), weight 80.5 kg, SpO2 96 %. Physical Exam  Constitutional: She is oriented to person, place, and time. She appears well-developed.  HENT:  Head: Normocephalic.  Eyes: Pupils are equal, round, and reactive to light.  Respiratory: Effort normal.  GI: Soft. Bowel sounds are normal.  Musculoskeletal:     Cervical back: Normal range of motion.  Neurological: She is alert and oriented to person, place, and time. She has normal strength. GCS eye subscore is 4. GCS verbal subscore is 5. GCS motor subscore is 6.  Strength is 5 out of 5 iliopsoas, quads, hamstrings, gastrocs, into tibialis, and EHL.  Skin: Skin is warm and dry.     Assessment/Plan 54 year old presents for removal of dorsal column stimulator L3-4 posterior lumbar interbody fusion with removal of hardware L4-S1.  Jaquelinne Glendening P, MD 06/28/2019, 9:53 AM

## 2019-06-28 NOTE — Anesthesia Postprocedure Evaluation (Signed)
Anesthesia Post Note  Patient: Katrina Fernandez  Procedure(s) Performed: Posterior Lumbar Interbody Fusion - Lumbar three-Lumbar four removal of hardware Lumbar four-Sacral one Interbody Fusion (N/A Back) Removal of dorsal column stimulator and generator - (N/A )     Patient location during evaluation: PACU Anesthesia Type: General Level of consciousness: awake and alert, patient cooperative and oriented Pain management: pain level controlled Vital Signs Assessment: post-procedure vital signs reviewed and stable Respiratory status: spontaneous breathing, nonlabored ventilation and respiratory function stable Cardiovascular status: blood pressure returned to baseline and stable Postop Assessment: no apparent nausea or vomiting Anesthetic complications: no    Last Vitals:  Vitals:   06/28/19 1530 06/28/19 1548  BP: 123/83 111/76  Pulse: 81 92  Resp: 13 15  Temp:  36.4 C  SpO2: 97% 99%    Last Pain:  Vitals:   06/28/19 1825  TempSrc:   PainSc: 5                  Mistee Soliman,E. Austine Wiedeman

## 2019-06-28 NOTE — Op Note (Signed)
Preoperative diagnosis: Degenerative disc disease lumbar instability segmental instability L3-4  2.  Ineffective and nonfunctioning dorsal column stimulator  Postoperative diagnosis: Same  Procedure: #1 exploration fusion move of hardware L4-S1 with removal of bilateral L5 and S1 screws of the Legacy 6.35 Medtronic pedicle screw set  2.  Decompressive lumbar laminectomy with complete medial facetectomies and radical foraminotomies of the L3 and L4 nerve roots with posterior lumbar interbody fusions at that level utilizing the globus insert and rotate titanium cages packed with locally harvested autograft mixed with DBX mix  3.  Pedicle screw fixation L3-4 with placement of bilateral new L3 pedicle screws again Medtronic Legacy thick 6.35  4.  Posterior lateral arthrodesis L3-4 utilizing locally harvested autograft mixed with DBX mix  Surgeon: [name] Mikinzie Maciejewski  Assistant: Nash Shearer  Anesthesia: General  EBL: 200  HPI: Patient is a very pleasant 54 year old female is a progression worsening back and bilateral hip and leg pain previously undergone a 41 fusion and dorsal column stimulator placement the stimulator was working more not giving her any relief she is undergone extraforaminal discectomy few months ago with progressive worsening back and bilateral leg pain and can consistent with an L3 and L4 nerve root pattern work-up with myelography showed progressive degeneration and instability at L3-4 with recurrent disc herniation.  And due to patient progression of clinical syndrome imaging findings and failed conservative treatment I recommended removal of dorsal column stimulator expiration fusion removal hardware and posterior lumbar interbody fusion L3-4.  I extensively went over the risks and benefits of that operation with her as well as perioperative course expectations of outcome and alternatives of surgery and she understood and agreed to proceed forward.  Operative procedure: Patient  brought into the OR was due to general anesthesia positioned prone the Wilson frame her back was prepped and draped in routine sterile fashion her old lumbar incision was opened up and extended slightly cephalad subperiosteal dissection was carried lamina of L3 exposing the spinous process at L3 and then exposed the hardware from L4 down S1.  The fusion did appear to be solid so I disconnected the knots disconnected and remove the rods and the cross-link and removed bilateral S1 L5 screws.  Then I remove the spinous process at L3 performed a central decompression with complete medial facetectomies and radical foraminotomies of the L3 and L4 nerve roots.  I then aggressively under bit the supra articulating facet to gain access to the lateral margin of disc base cleaned out the disc base bilaterally and radically with pituitary rongeurs Epstein curettes Scovil curettes and utilizing sequential distraction.  With an 11 distractor in place I selected 9-12 cage packed with locally harvested autograft mixed with DBX mix and after adequate dyspneic discectomy had been achieved with removal of the recurrent disc in the foramen on the left at L3 and adequate endplate preparation been achieved I inserted the cage sequentially with an exits extensive amount of autograft mixed packed centrally then placed bilateral L3 pedicle screws under fluoroscopy x-ray confirmed position of all implants.  Then inspected all the foramina to confirm patency and no migration of graft material everything appeared to be patent packed all that would Gelfoam aggressively decorticated the TPs lateral facet complexes and packed the remainder the autograft mix posterior laterally from L3-L4 then I connected a rod appetite did not stand compressed L3 against L4 and then after all that it been done I then opened up the incision removing the generator for the spinal stimulator through a  separate stab incision and then I tracked the wires through the  lumbar incision disconnected the anchoring sutures cut the wires and remove the leads and the wires and the generator in its entirety.  Then all incisions were copiously irrigated meticulous hemostasis was maintained and they were all closed with interrupted Vicryl in a running 4 subcuticular in the skin Dermabond was applied and a sterile dressing was applied patient recovery in stable condition.  At the end the case all needle count sponge counts were correct.

## 2019-06-28 NOTE — Progress Notes (Signed)
Pharmacy Antibiotic Note  Katrina Fernandez is a 54 y.o. female admitted on 06/28/2019 with surgical prophylaxis with spinal drain.  Pharmacy has been consulted for Vanco dosing.  CC/HPI: Degenerative disc disease,  lumbar instability,  segmental instability L3-4. Ineffective and nonfunctioning dorsal column stimulator s/p surgery 06/28/19.  Plan: Vancomycin 1250 mg IV Q 24 hrs. Goal AUC 400-550. Expected AUC: 551 SCr used: 0.88    Height: 5\' 3"  (160 cm) Weight: 177 lb 6 oz (80.5 kg) IBW/kg (Calculated) : 52.4  Temp (24hrs), Avg:97.9 F (36.6 C), Min:97.6 F (36.4 C), Max:98.2 F (36.8 C)  Recent Labs  Lab 06/26/19 0918  WBC 6.4  CREATININE 0.88    Estimated Creatinine Clearance: 75.9 mL/min (by C-G formula based on SCr of 0.88 mg/dL).    Allergies  Allergen Reactions  . Penicillins Anaphylaxis    Has patient had a PCN reaction causing immediate rash, facial/tongue/throat swelling, SOB or lightheadedness with hypotension: Yes Has patient had a PCN reaction causing severe rash involving mucus membranes or skin necrosis: No Has patient had a PCN reaction that required hospitalization No Has patient had a PCN reaction occurring within the last 10 years: No If all of the above answers are "NO", then may proceed with Cephalosporin use.   . Lactose Nausea And Vomiting and Swelling       . Chlorhexidine Rash and Other (See Comments)    Dermatitis - Allergic  . Tape Rash    Plastic tape, silicone tape, Steri Strips   Katrina Fernandez S. Alford Highland, PharmD, BCPS Clinical Staff Pharmacist [email or web address]  Wayland Salinas 06/28/2019 4:04 PM

## 2019-06-28 NOTE — Anesthesia Procedure Notes (Signed)
Procedure Name: Intubation Date/Time: 06/28/2019 10:33 AM Performed by: Inda Coke, CRNA Pre-anesthesia Checklist: Patient identified, Emergency Drugs available, Suction available and Patient being monitored Patient Re-evaluated:Patient Re-evaluated prior to induction Oxygen Delivery Method: Circle System Utilized Preoxygenation: Pre-oxygenation with 100% oxygen Induction Type: IV induction Ventilation: Mask ventilation without difficulty Laryngoscope Size: Mac and 3 Grade View: Grade I Tube type: Oral Tube size: 7.0 mm Number of attempts: 1 Airway Equipment and Method: Stylet and Oral airway Placement Confirmation: ETT inserted through vocal cords under direct vision,  positive ETCO2 and breath sounds checked- equal and bilateral Secured at: 21 cm Tube secured with: Tape Dental Injury: Teeth and Oropharynx as per pre-operative assessment

## 2019-06-29 ENCOUNTER — Encounter: Payer: Self-pay | Admitting: *Deleted

## 2019-06-29 LAB — CBC WITH DIFFERENTIAL/PLATELET
Abs Immature Granulocytes: 0.05 10*3/uL (ref 0.00–0.07)
Basophils Absolute: 0 10*3/uL (ref 0.0–0.1)
Basophils Relative: 0 %
Eosinophils Absolute: 0 10*3/uL (ref 0.0–0.5)
Eosinophils Relative: 0 %
HCT: 32.9 % — ABNORMAL LOW (ref 36.0–46.0)
Hemoglobin: 10.7 g/dL — ABNORMAL LOW (ref 12.0–15.0)
Immature Granulocytes: 1 %
Lymphocytes Relative: 12 %
Lymphs Abs: 1.3 10*3/uL (ref 0.7–4.0)
MCH: 29.8 pg (ref 26.0–34.0)
MCHC: 32.5 g/dL (ref 30.0–36.0)
MCV: 91.6 fL (ref 80.0–100.0)
Monocytes Absolute: 0.8 10*3/uL (ref 0.1–1.0)
Monocytes Relative: 8 %
Neutro Abs: 8.7 10*3/uL — ABNORMAL HIGH (ref 1.7–7.7)
Neutrophils Relative %: 79 %
Platelets: 203 10*3/uL (ref 150–400)
RBC: 3.59 MIL/uL — ABNORMAL LOW (ref 3.87–5.11)
RDW: 11.9 % (ref 11.5–15.5)
WBC: 10.9 10*3/uL — ABNORMAL HIGH (ref 4.0–10.5)
nRBC: 0 % (ref 0.0–0.2)

## 2019-06-29 LAB — BASIC METABOLIC PANEL
Anion gap: 12 (ref 5–15)
BUN: 11 mg/dL (ref 6–20)
CO2: 29 mmol/L (ref 22–32)
Calcium: 8.4 mg/dL — ABNORMAL LOW (ref 8.9–10.3)
Chloride: 104 mmol/L (ref 98–111)
Creatinine, Ser: 0.88 mg/dL (ref 0.44–1.00)
GFR calc Af Amer: >60 mL/min (ref >60)
GFR calc non Af Amer: >60 mL/min (ref >60)
Glucose, Bld: 112 mg/dL — ABNORMAL HIGH (ref 70–99)
Potassium: 4.6 mmol/L (ref 3.5–5.1)
Sodium: 145 mmol/L (ref 135–145)

## 2019-06-29 MED ORDER — OXYCODONE-ACETAMINOPHEN 5-325 MG PO TABS: 1.0000 | ORAL_TABLET | Freq: Four times a day (QID) | ORAL | 0 refills | Status: DC | PRN

## 2019-06-29 MED ORDER — TIZANIDINE HCL 4 MG PO TABS: 4.0000 mg | ORAL_TABLET | Freq: Three times a day (TID) | ORAL | 1 refills | Status: DC

## 2019-06-29 NOTE — Significant Event (Signed)
Rapid Response Event Note  Overview: Hypotension  Initial Focused Assessment: Received a call from nurse with concerns of patient having hypotension. Per nurse, patient was not symptomatic, she received over a 1L of crystalloid overnight. Patient has been up walking with staff and has been voiding as well.   I was with another patient in an emergency, I asked the nurse to order some repeat labs (BMET/CBC) since the patient was post-op surgery. Per nurse, patient's drain has not put out too much.   I went up to see the patient at [place]. She was alert and oriented, follow commands, and she endorsed she just ate breakfast. States aside from some incisional pain, she feels okay, did state that her head felt a "funny" at times but denies being dizzy. Skin warm and dry, lung/heart sounds - normal. Endorses she has been voiding and denies any trouble with that. BP 89/59 (69), HR 70, 98% on RA  Interventions: -- No RRT Interventions.  -- H/H reviewed 10.7/33 (was 14.6/42). No overt signs of bleeding or shock  Plan of Care: -- Monitor VS -- Rest per NSU MD  Event Summary:  Call Time 0600 Arrival Time 0715 (with another patient in an emergency) End Time [place]  Katrina Fernandez R

## 2019-06-29 NOTE — Progress Notes (Signed)
Pt doing well. Pt given D/C instructions with verbal understanding. Rx's were sent to pharmacy by MD. Pt's incisions are clean and dry with no sign of infection. Pt's IV was removed prior to D/C. Pt D/C'd home via wheelchair per MD order. Pt is stable @ D/C and has no other needs at this time. Holli Humbles, RN

## 2019-06-29 NOTE — Evaluation (Signed)
Occupational Therapy Evaluation Patient Details Name: Katrina Fernandez MRN: [id number] DOB: Jan 22, 1966 Today's Date: 06/29/2019    History of Present Illness 54 y.o. female admitted on 06/28/2019 with surgical prophylaxis with spinal drain.  Exploration fusion L4-S1 and removing stimulator on 06/28/19. PMH including DDD and L3-4 instability with ineffective stimulator.    Clinical Impression   PTA, pt was living with her daughter and was independent; pt's mother planning to stay at dc for support. Currently, pt performing ADLs and functional mobility at Supervision level. Provided education and handout on back precautions, bed mobility, brace management, grooming, LB ADLs, toileting, and functional transfers; pt demonstrated understanding. Answered all pt questions. Recommend dc home once medically stable per physician. All acute OT needs met and will sign off. Thank you.    Follow Up Recommendations  No OT follow up    Equipment Recommendations  None recommended by OT    Recommendations for Other Services PT consult     Precautions / Restrictions Precautions Precautions: Fall;Back Precaution Booklet Issued: Yes (comment) Precaution Comments: Provided education on back precautions and compensatory techniques for ADLs      Mobility Bed Mobility Overal bed mobility: Needs Assistance Bed Mobility: Rolling;Sidelying to Sit Rolling: Min guard Sidelying to sit: Min guard       General bed mobility comments: Min Guard A for safety. Demonstrating log roll technique  Transfers Overall transfer level: Needs assistance Equipment used: None Transfers: Sit to/from Stand Sit to Stand: Supervision;Min guard         General transfer comment: Supervision-Min Guard Afor safety    Balance Overall balance assessment: No apparent balance deficits (not formally assessed)                                         ADL either performed or assessed with clinical judgement    ADL Overall ADL's : Needs assistance/impaired                                       General ADL Comments: Pt performing ADLs and functional mobility at Supervision level. Provided education on back precautions, bed mobility, grooming, brace management, LB ADLs, toileting, and functional transfers.     Vision         Perception     Praxis      Pertinent Vitals/Pain Pain Assessment: Faces Faces Pain Scale: Hurts little more Pain Location: Back Pain Descriptors / Indicators: Discomfort;Grimacing Pain Intervention(s): Monitored during session;Limited activity within patient's tolerance;Repositioned     Hand Dominance Right   Extremity/Trunk Assessment Upper Extremity Assessment Upper Extremity Assessment: Overall WFL for tasks assessed   Lower Extremity Assessment Lower Extremity Assessment: Defer to PT evaluation   Cervical / Trunk Assessment Cervical / Trunk Assessment: Other exceptions Cervical / Trunk Exceptions: s/p back sx   Communication Communication Communication: No difficulties   Cognition Arousal/Alertness: Awake/alert Behavior During Therapy: WFL for tasks assessed/performed Overall Cognitive Status: Within Functional Limits for tasks assessed                                     General Comments  bandage in place    Exercises     Shoulder Instructions      Home Living Family/patient expects  to be discharged to:: Private residence Living Arrangements: Children Available Help at Discharge: Family;Available 24 hours/day(Mother staying at dc) Type of Home: House Home Access: Stairs to enter [employer] of Steps: 1   Home Layout: One level     Bathroom Shower/Tub: Occupational psychologist: Standard     Home Equipment: Shower seat          Prior Functioning/Environment Level of Independence: Independent                 OT Problem List: Decreased strength;Decreased range of  motion;Decreased activity tolerance;Decreased knowledge of precautions;Pain      OT Treatment/Interventions:      OT Goals(Current goals can be found in the care plan section) Acute Rehab OT Goals Patient Stated Goal: Go home OT Goal Formulation: All assessment and education complete, DC therapy  OT Frequency:     Barriers to D/C:            Co-evaluation              AM-PAC OT "6 Clicks" Daily Activity     Outcome Measure Help from another person eating meals?: None Help from another person taking care of personal grooming?: None Help from another person toileting, which includes using toliet, bedpan, or urinal?: None Help from another person bathing (including washing, rinsing, drying)?: None Help from another person to put on and taking off regular upper body clothing?: None Help from another person to put on and taking off regular lower body clothing?: None 6 Click Score: 24   End of Session Equipment Utilized During Treatment: Back brace Nurse Communication: Mobility status  Activity Tolerance: Patient tolerated treatment well Patient left: in chair;with call bell/phone within reach  OT Visit Diagnosis: Unsteadiness on feet (R26.81);Other abnormalities of gait and mobility (R26.89);Muscle weakness (generalized) (M62.81);Pain Pain - part of body: (Back)                Time: 9094-0005 OT Time Calculation (min): 20 min Charges:  OT General Charges $OT Visit: 1 Visit OT Evaluation $OT Eval Low Complexity: 1 Low  Alvy Alsop MSOT, OTR/L Acute Rehab Pager: [phone] Office: [place] 06/29/2019, 9:08 AM

## 2019-06-29 NOTE — Discharge Summary (Signed)
Physician Discharge Summary  Patient ID: Katrina Fernandez MRN: [id number] DOB/AGE: 1965-10-30 54 y.o.  Admit date: 06/28/2019 Discharge date: 06/29/2019  Admission Diagnoses: Degenerative disc disease lumbar instability segmental instability L3-4  2.  Ineffective and nonfunctioning dorsal column stimulator   Discharge Diagnoses: same   Discharged Condition: good  Hospital Course: The patient was admitted on 06/28/2019 and taken to the operating room where the patient underwent PLIF L3-4. The patient tolerated the procedure well and was taken to the recovery room and then to the floor in stable condition. The hospital course was routine. There were no complications. The wound remained clean dry and intact. Pt had appropriate back soreness. No complaints of leg pain or new N/T/W. The patient remained afebrile with stable vital signs, and tolerated a regular diet. The patient continued to increase activities, and pain was well controlled with oral pain medications.   Consults: None  Significant Diagnostic Studies:  Results for orders placed or performed during the hospital encounter of 06/28/19  Glucose, capillary  Result Value Ref Range   Glucose-Capillary 92 70 - 99 mg/dL  Glucose, capillary  Result Value Ref Range   Glucose-Capillary 93 70 - 99 mg/dL  CBC with Differential/Platelet  Result Value Ref Range   WBC 10.9 (H) 4.0 - 10.5 K/uL   RBC 3.59 (L) 3.87 - 5.11 MIL/uL   Hemoglobin 10.7 (L) 12.0 - 15.0 g/dL   HCT 32.9 (L) 36.0 - 46.0 %   MCV 91.6 80.0 - 100.0 fL   MCH 29.8 26.0 - 34.0 pg   MCHC 32.5 30.0 - 36.0 g/dL   RDW 11.9 11.5 - 15.5 %   Platelets 203 150 - 400 K/uL   nRBC 0.0 0.0 - 0.2 %   Neutrophils Relative % 79 %   Neutro Abs 8.7 (H) 1.7 - 7.7 K/uL   Lymphocytes Relative 12 %   Lymphs Abs 1.3 0.7 - 4.0 K/uL   Monocytes Relative 8 %   Monocytes Absolute 0.8 0.1 - 1.0 K/uL   Eosinophils Relative 0 %   Eosinophils Absolute 0.0 0.0 - 0.5 K/uL   Basophils Relative  0 %   Basophils Absolute 0.0 0.0 - 0.1 K/uL   Immature Granulocytes 1 %   Abs Immature Granulocytes 0.05 0.00 - 0.07 K/uL  Basic metabolic panel  Result Value Ref Range   Sodium 145 135 - 145 mmol/L   Potassium 4.6 3.5 - 5.1 mmol/L   Chloride 104 98 - 111 mmol/L   CO2 29 22 - 32 mmol/L   Glucose, Bld 112 (H) 70 - 99 mg/dL   BUN 11 6 - 20 mg/dL   Creatinine, Ser 0.88 0.44 - 1.00 mg/dL   Calcium 8.4 (L) 8.9 - 10.3 mg/dL   GFR calc non Af Amer >60 >60 mL/min   GFR calc Af Amer >60 >60 mL/min   Anion gap 12 5 - 15    DG Lumbar Spine 2-3 Views  Result Date: 06/28/2019 CLINICAL DATA:  L3-4 fusion EXAM: LUMBAR SPINE - 2-3 VIEW; DG C-ARM 1-60 MIN COMPARISON:  04/26/2019 FLUOROSCOPY TIME:  Fluoroscopy Time:  31 seconds Radiation Exposure Index (if provided by the fluoroscopic device): Not available Number of Acquired Spot Images: 2 FINDINGS: Interbody fusion is now seen at L3-4 with pedicle screw placement. Previously seen hardware extending from L4-S1 is been removed. IMPRESSION: L3-4 fusion. Electronically Signed   By: Inez Catalina M.D.   On: 06/28/2019 13:12   DG C-Arm 1-60 Min  Result Date: 06/28/2019 CLINICAL DATA:  L3-4 fusion EXAM: LUMBAR SPINE - 2-3 VIEW; DG C-ARM 1-60 MIN COMPARISON:  04/26/2019 FLUOROSCOPY TIME:  Fluoroscopy Time:  31 seconds Radiation Exposure Index (if provided by the fluoroscopic device): Not available Number of Acquired Spot Images: 2 FINDINGS: Interbody fusion is now seen at L3-4 with pedicle screw placement. Previously seen hardware extending from L4-S1 is been removed. IMPRESSION: L3-4 fusion. Electronically Signed   By: Inez Catalina M.D.   On: 06/28/2019 13:12    Antibiotics:  Anti-infectives (From admission, onward)   Start     Dose/Rate Route Frequency Ordered Stop   06/29/19 0930  vancomycin (VANCOREADY) IVPB 1250 mg/250 mL     1,250 mg 166.7 mL/hr over 90 Minutes Intravenous Every 24 hours 06/28/19 1607     06/28/19 1033  bacitracin 50,000 Units in  sodium chloride 0.9 % 500 mL irrigation  Status:  Discontinued       As needed 06/28/19 1133 06/28/19 1352   06/28/19 0900  vancomycin (VANCOCIN) IVPB 1000 mg/200 mL premix     1,000 mg 200 mL/hr over 60 Minutes Intravenous On call to O.R. 06/28/19 0848 06/28/19 1029      Discharge Exam: Blood pressure (!) 89/59, pulse 79, temperature 98.3 F (36.8 C), temperature source Oral, resp. rate 16, height '5\' 3"'  (1.6 m), weight 80.5 kg, SpO2 98 %. Neurologic: Grossly normal Ambulating and voiding well, incision cdi  Discharge Medications:   Allergies as of 06/29/2019      Reactions   Penicillins Anaphylaxis   Has patient had a PCN reaction causing immediate rash, facial/tongue/throat swelling, SOB or lightheadedness with hypotension: Yes Has patient had a PCN reaction causing severe rash involving mucus membranes or skin necrosis: No Has patient had a PCN reaction that required hospitalization No Has patient had a PCN reaction occurring within the last 10 years: No If all of the above answers are "NO", then may proceed with Cephalosporin use.   Lactose Nausea And Vomiting, Swelling       Chlorhexidine Rash, Other (See Comments)   Dermatitis - Allergic   Tape Rash   Plastic tape, silicone tape, Steri Strips      Medication List    TAKE these medications   busPIRone 10 MG tablet Commonly known as: BUSPAR TAKE 1 TABLET BY MOUTH THREE TIMES A DAY What changed:   when to take this  reasons to take this   celecoxib 200 MG capsule Commonly known as: CELEBREX Take 200 mg by mouth daily.   diclofenac sodium 1 % Gel Commonly known as: VOLTAREN Apply 4 g topically 4 (four) times daily. To affected joint. What changed:   when to take this  reasons to take this   DULoxetine 60 MG capsule Commonly known as: CYMBALTA TAKE 2 CAPSULES BY MOUTH EVERY DAY   furosemide 40 MG tablet Commonly known as: LASIX TAKE 1 TABLET BY MOUTH TWICE A DAY What changed: when to take this    methadone 5 MG tablet Commonly known as: DOLOPHINE Take 2.5 mg by mouth daily.   montelukast 10 MG tablet Commonly known as: SINGULAIR TAKE 1 TABLET BY MOUTH EVERYDAY AT BEDTIME What changed:   how much to take  how to take this  when to take this  additional instructions   Narcan 4 MG/0.1ML Liqd nasal spray kit Generic drug: naloxone Place 0.4 mg into the nose once.   oxyCODONE-acetaminophen 5-325 MG tablet Commonly known as: Percocet Take 1-2 tablets by mouth every 6 (six) hours as needed for severe pain.  Ozempic (1 MG/DOSE) 2 MG/1.5ML Sopn Generic drug: Semaglutide (1 MG/DOSE) INJECT 1 MG INTO THE SKIN ONCE A WEEK. What changed: See the new instructions.   polyethylene glycol 17 g packet Commonly known as: MIRALAX / GLYCOLAX Take 17 g by mouth daily as needed for mild constipation or moderate constipation.   pramipexole 0.5 MG tablet Commonly known as: MIRAPEX Take 1 tablet (0.5 mg total) by mouth at bedtime. What changed: additional instructions   simvastatin 20 MG tablet Commonly known as: ZOCOR TAKE 1 TABLET BY MOUTH EVERY DAY What changed:   how much to take  how to take this  when to take this  additional instructions   tiZANidine 4 MG tablet Commonly known as: Zanaflex Take 1 tablet (4 mg total) by mouth 3 (three) times daily. What changed: You were already taking a medication with the same name, and this prescription was added. Make sure you understand how and when to take each.   tiZANidine 4 MG capsule Commonly known as: ZANAFLEX Take 8 mg by mouth at bedtime. Take an additional 4 mg as needed for muscle spasm What changed: Another medication with the same name was added. Make sure you understand how and when to take each.   traMADol 50 MG tablet Commonly known as: ULTRAM Take 50-100 mg by mouth every 8 (eight) hours as needed (Pain).       Disposition: home   Final Dx: plif L3-4, removal of spinal cord stimulator  Discharge  Instructions     Remove dressing in 72 hours   Complete by: As directed    Call MD for:  difficulty breathing, headache or visual disturbances   Complete by: As directed    Call MD for:  hives   Complete by: As directed    Call MD for:  persistant nausea and vomiting   Complete by: As directed    Call MD for:  redness, tenderness, or signs of infection (pain, swelling, redness, odor or green/yellow discharge around incision site)   Complete by: As directed    Call MD for:  severe uncontrolled pain   Complete by: As directed    Call MD for:  temperature >100.4   Complete by: As directed    Diet - low sodium heart healthy   Complete by: As directed    Driving Restrictions   Complete by: As directed    No driving for 2 weeks, no riding in the car for 1 week   Increase activity slowly   Complete by: As directed    Lifting restrictions   Complete by: As directed    No lifting more than 8 lbs         Signed: Ocie Cornfield Siyona Coto 06/29/2019, 7:56 AM

## 2019-06-29 NOTE — Evaluation (Signed)
Physical Therapy Evaluation and Discharge Patient Details Name: Katrina Fernandez MRN: [id number] DOB: 1965-11-01 Today's Date: 06/29/2019   History of Present Illness  54 y.o. female admitted on 06/28/2019 with surgical prophylaxis with spinal drain.  Exploration fusion L4-S1 and removing stimulator on 06/28/19. PMH including DDD and L3-4 instability with ineffective stimulator.   Clinical Impression  Patient evaluated by Physical Therapy with no further acute PT needs identified. All education has been completed and the patient has no further questions. Pt was able to demonstrate transfers and ambulation with gross modified independence and no AD. Pt was educated on precautions, brace application/wearing schedule, appropriate activity progression, and car transfer. See below for any follow-up Physical Therapy or equipment needs. PT is signing off. Thank you for this referral.     Follow Up Recommendations No PT follow up;Supervision - Intermittent    Equipment Recommendations  None recommended by PT    Recommendations for Other Services       Precautions / Restrictions Precautions Precautions: Fall;Back Precaution Booklet Issued: Yes (comment) Precaution Comments: Provided education on back precautions and compensatory techniques for ADLs Restrictions Weight Bearing Restrictions: No      Mobility  Bed Mobility Overal bed mobility: Modified Independent Bed Mobility: Sit to Sidelying Rolling: Min guard Sidelying to sit: Min guard       General bed mobility comments: Pt was able to transition from sitting to sidelying without assistance. HOB flat and no use of rails to simulate home environment. PT managed pillow placement between knees for pt.   Transfers Overall transfer level: Modified independent Equipment used: None Transfers: Sit to/from Stand Sit to Stand: Supervision;Min guard         General transfer comment: Pt demonstrated proper hand placement on seated  surface for safety. No assist required and no unsteadiness noted.   Ambulation/Gait Ambulation/Gait assistance: Supervision;Modified independent (Device/Increase time) Gait Distance (Feet): 400 Feet Assistive device: None Gait Pattern/deviations: Step-through pattern;Decreased stride length Gait velocity: Decreased Gait velocity interpretation: 1.31 - 2.62 ft/sec, indicative of limited community ambulator General Gait Details: Initially pt guarded and appeared mildly unsteady, however progressed to modified independent level by end of gait training. No overt LOB noted and no hands-on guarding required.   Stairs Stairs: (Pt declined stair training)          Wheelchair Mobility    Modified Rankin (Stroke Patients Only)       Balance Overall balance assessment: Mild deficits observed, not formally tested                                           Pertinent Vitals/Pain Pain Assessment: Faces Faces Pain Scale: Hurts little more Pain Location: Back Pain Descriptors / Indicators: Discomfort;Grimacing Pain Intervention(s): Limited activity within patient's tolerance;Monitored during session;Repositioned    Home Living Family/patient expects to be discharged to:: Private residence Living Arrangements: Children Available Help at Discharge: Family;Available 24 hours/day(Mother staying at dc) Type of Home: House Home Access: Stairs to enter   [employer] of Steps: 1 Home Layout: One level Home Equipment: Shower seat      Prior Function Level of Independence: Independent               Hand Dominance   Dominant Hand: Right    Extremity/Trunk Assessment   Upper Extremity Assessment Upper Extremity Assessment: Defer to OT evaluation    Lower Extremity Assessment Lower Extremity  Assessment: Generalized weakness(Consistent with pre-op diagnosis)    Cervical / Trunk Assessment Cervical / Trunk Assessment: Other exceptions Cervical /  Trunk Exceptions: s/p back sx  Communication   Communication: No difficulties  Cognition Arousal/Alertness: Awake/alert Behavior During Therapy: WFL for tasks assessed/performed Overall Cognitive Status: Within Functional Limits for tasks assessed                                        General Comments General comments (skin integrity, edema, etc.): bandage in place    Exercises     Assessment/Plan    PT Assessment Patent does not need any further PT services  PT Problem List         PT Treatment Interventions      PT Goals (Current goals can be found in the Care Plan section)  Acute Rehab PT Goals Patient Stated Goal: Go home PT Goal Formulation: All assessment and education complete, DC therapy    Frequency     Barriers to discharge        Co-evaluation               AM-PAC PT "6 Clicks" Mobility  Outcome Measure Help needed turning from your back to your side while in a flat bed without using bedrails?: None Help needed moving from lying on your back to sitting on the side of a flat bed without using bedrails?: None Help needed moving to and from a bed to a chair (including a wheelchair)?: None Help needed standing up from a chair using your arms (e.g., wheelchair or bedside chair)?: None Help needed to walk in hospital room?: None Help needed climbing 3-5 steps with a railing? : None 6 Click Score: 24    End of Session Equipment Utilized During Treatment: Back brace Activity Tolerance: Patient tolerated treatment well Patient left: in bed;with call bell/phone within reach Nurse Communication: Mobility status PT Visit Diagnosis: Unsteadiness on feet (R26.81);Pain Pain - part of body: (back)    Time: [id number] PT Time Calculation (min) (ACUTE ONLY): 12 min   Charges:   PT Evaluation $PT Eval Moderate Complexity: 1 Mod PT Treatments $Gait Training: 8-22 mins        Rolinda Roan, PT, DPT Acute Rehabilitation Services Pager:  [phone] Office: [phone]   Thelma Comp 06/29/2019, 10:28 AM

## 2019-07-04 ENCOUNTER — Ambulatory Visit: Payer: Medicare HMO | Admitting: Psychology

## 2019-07-18 ENCOUNTER — Ambulatory Visit (INDEPENDENT_AMBULATORY_CARE_PROVIDER_SITE_OTHER): Payer: Medicare HMO | Admitting: Psychology

## 2019-07-18 DIAGNOSIS — F4323 Adjustment disorder with mixed anxiety and depressed mood: Secondary | ICD-10-CM | POA: Diagnosis not present

## 2019-07-18 DIAGNOSIS — F411 Generalized anxiety disorder: Secondary | ICD-10-CM

## 2019-07-18 DIAGNOSIS — R69 Illness, unspecified: Secondary | ICD-10-CM | POA: Diagnosis not present

## 2019-07-19 ENCOUNTER — Encounter: Payer: Self-pay | Admitting: Family Medicine

## 2019-07-19 ENCOUNTER — Telehealth (INDEPENDENT_AMBULATORY_CARE_PROVIDER_SITE_OTHER): Payer: Medicare HMO | Admitting: Family Medicine

## 2019-07-19 ENCOUNTER — Other Ambulatory Visit: Payer: Self-pay

## 2019-07-19 DIAGNOSIS — F411 Generalized anxiety disorder: Secondary | ICD-10-CM | POA: Diagnosis not present

## 2019-07-19 DIAGNOSIS — Z79899 Other long term (current) drug therapy: Secondary | ICD-10-CM | POA: Diagnosis not present

## 2019-07-19 DIAGNOSIS — W1811XA Fall from or off toilet without subsequent striking against object, initial encounter: Secondary | ICD-10-CM | POA: Diagnosis not present

## 2019-07-19 DIAGNOSIS — E785 Hyperlipidemia, unspecified: Secondary | ICD-10-CM | POA: Diagnosis not present

## 2019-07-19 DIAGNOSIS — R55 Syncope and collapse: Secondary | ICD-10-CM | POA: Diagnosis not present

## 2019-07-19 DIAGNOSIS — B349 Viral infection, unspecified: Secondary | ICD-10-CM | POA: Diagnosis not present

## 2019-07-19 DIAGNOSIS — Z7409 Other reduced mobility: Secondary | ICD-10-CM | POA: Diagnosis not present

## 2019-07-19 DIAGNOSIS — W010XXD Fall on same level from slipping, tripping and stumbling without subsequent striking against object, subsequent encounter: Secondary | ICD-10-CM | POA: Diagnosis not present

## 2019-07-19 DIAGNOSIS — I952 Hypotension due to drugs: Secondary | ICD-10-CM | POA: Diagnosis not present

## 2019-07-19 DIAGNOSIS — R509 Fever, unspecified: Secondary | ICD-10-CM | POA: Diagnosis not present

## 2019-07-19 DIAGNOSIS — U071 COVID-19: Secondary | ICD-10-CM | POA: Diagnosis not present

## 2019-07-19 DIAGNOSIS — K449 Diaphragmatic hernia without obstruction or gangrene: Secondary | ICD-10-CM | POA: Diagnosis not present

## 2019-07-19 DIAGNOSIS — S0083XA Contusion of other part of head, initial encounter: Secondary | ICD-10-CM | POA: Diagnosis not present

## 2019-07-19 DIAGNOSIS — E860 Dehydration: Secondary | ICD-10-CM | POA: Diagnosis not present

## 2019-07-19 DIAGNOSIS — Z743 Need for continuous supervision: Secondary | ICD-10-CM | POA: Diagnosis not present

## 2019-07-19 DIAGNOSIS — Z043 Encounter for examination and observation following other accident: Secondary | ICD-10-CM | POA: Diagnosis not present

## 2019-07-19 DIAGNOSIS — M961 Postlaminectomy syndrome, not elsewhere classified: Secondary | ICD-10-CM | POA: Diagnosis not present

## 2019-07-19 DIAGNOSIS — W19XXXA Unspecified fall, initial encounter: Secondary | ICD-10-CM | POA: Diagnosis not present

## 2019-07-19 DIAGNOSIS — I10 Essential (primary) hypertension: Secondary | ICD-10-CM | POA: Diagnosis not present

## 2019-07-19 DIAGNOSIS — E876 Hypokalemia: Secondary | ICD-10-CM | POA: Diagnosis not present

## 2019-07-19 DIAGNOSIS — R1031 Right lower quadrant pain: Secondary | ICD-10-CM | POA: Diagnosis not present

## 2019-07-19 DIAGNOSIS — S0003XA Contusion of scalp, initial encounter: Secondary | ICD-10-CM | POA: Diagnosis not present

## 2019-07-19 DIAGNOSIS — R4702 Dysphasia: Secondary | ICD-10-CM | POA: Diagnosis not present

## 2019-07-19 DIAGNOSIS — R69 Illness, unspecified: Secondary | ICD-10-CM | POA: Diagnosis not present

## 2019-07-19 DIAGNOSIS — I82890 Acute embolism and thrombosis of other specified veins: Secondary | ICD-10-CM | POA: Diagnosis not present

## 2019-07-19 DIAGNOSIS — R0902 Hypoxemia: Secondary | ICD-10-CM | POA: Diagnosis not present

## 2019-07-19 DIAGNOSIS — I959 Hypotension, unspecified: Secondary | ICD-10-CM | POA: Diagnosis not present

## 2019-07-19 DIAGNOSIS — R42 Dizziness and giddiness: Secondary | ICD-10-CM | POA: Diagnosis not present

## 2019-07-19 NOTE — Assessment & Plan Note (Signed)
Her current symptoms are consistent with viral process.  Daughter with similar symptoms last week and negative COVID test.  I think COVID is less likely at this time however we discussed that if symptoms continue to worsen or she develops new symptoms to please let me know.  She should continue supportive care and symptomatic management at home.

## 2019-07-19 NOTE — Progress Notes (Signed)
Patient reports her daughter had symptoms over a week ago and patient symptoms of fever, sore throat, and body aches  Feels like she may have some mucus stuck in her throat she cant get up.    covid test was negative--from back surgery.   She has not taking any medication for the symptoms.

## 2019-07-19 NOTE — Progress Notes (Signed)
Katrina Fernandez - 54 y.o. female MRN [id number]  Date of birth: [birth year]   This visit type was conducted due to national recommendations for restrictions regarding the COVID-19 Pandemic (e.g. social distancing).  This format is felt to be most appropriate for this patient at this time.  All issues noted in this document were discussed and addressed.  No physical exam was performed (except for noted visual exam findings with Video Visits).  I discussed the limitations of evaluation and management by telemedicine and the availability of in person appointments. The patient expressed understanding and agreed to proceed.  I connected with@ on 07/19/19 at  1:00 PM EST by a video enabled telemedicine application and verified that I am speaking with the correct person using two identifiers.  Present at visit: Luetta Nutting, DO [name]   Patient Location: [place] [address]   Provider location:   [facility]  Chief Complaint  Patient presents with  . Generalized Body Aches  . Fever    all night  . Sore Throat    all night    HPI  Katrina Fernandez is a 54 y.o. female who presents via audio/video conferencing for a telehealth visit today.  She has complaint of low grade fever, sore throat and body aches.  She reports that symptoms started last night.  Her daughter had similar symptoms last week and tested negative for COVID-19.  She denies respiratory symptoms including cough, wheezing or shortness of breath.  She has not had GI symptom including nausea or diarrhea.  She had surgery about 3 weeks ago and reports she is healing and recovering well from this.  She has had good fluid intake.  She has not tried anything for treatment of this so far.    ROS:  A comprehensive ROS was completed and negative except as noted per HPI  Past Medical History:  Diagnosis Date  . Anemia   . Anxiety   . Arthritis    "back and knees" (07/23/2016)  . Chiari malformation  type I ([place]) dx'd 2014  . Chronic back pain    buldging, DDD; "neck and lower back" (07/23/2016)  . Chronic constipation    takes Miralax daily  . Chronic pain syndrome    takes Methadone and Keppra daily  . Depression    takes Effexor daily  . Endometriosis    1995  . Fibromyalgia   . GERD (gastroesophageal reflux disease)    not taking anything at the present time  . History of DVT of lower extremity 07/2012   LEFT LEG --  3/ 2014.Was on a blood transfusion for 6 months  . History of kidney stones   . History of MRSA infection 10 yrs ago  . Hyperlipidemia    takes Simvastatin daily  . Migraine    "daily recently" (07/23/2016)  . Muscle spasm    takes Zanaflex nightly  . Peripheral edema    takes Lasix daily as needed  . Peripheral neuropathy   . Pleurisy 2000  . Pneumonia    hx of-7-8 yrs ago  . PONV (postoperative nausea and vomiting)   . Seasonal allergies    takes Claritin daily  . Spinal headache    "since 2002; still get them; try to manage it w/lying flat"  . Weakness    and numbness in right hand    Past Surgical History:  Procedure Laterality Date  . ANTERIOR CERVICAL DECOMP/DISCECTOMY FUSION  07-25-2009   C5 -- C6  .  APPENDECTOMY  age 4  . APPLICATION OF CRANIAL NAVIGATION N/A 06/23/2017   Procedure: APPLICATION OF CRANIAL NAVIGATION;  Surgeon: Kary Kos, MD;  Location: [facility];  Service: Neurosurgery;  Laterality: N/A;  . BACK SURGERY    . BRAIN SURGERY     VP shunt  . BREAST REDUCTION SURGERY Bilateral 06/07/2013   Procedure: BILATERAL MAMMARY REDUCTION  (BREAST);  Surgeon: Theodoro Kos, DO;  Location: [facility];  Service: Plastics;  Laterality: Bilateral;  . BREAST REDUCTION SURGERY Bilateral 06/07/2013   Procedure:  LIPOSUCTION;  Surgeon: Theodoro Kos, DO;  Location: [facility];  Service: Plastics;  Laterality: Bilateral;  . CARPAL TUNNEL RELEASE Bilateral 2001  . COLONOSCOPY  X 2   "polyps were benign"  . DILATION AND  CURETTAGE OF UTERUS    . ESOPHAGOGASTRODUODENOSCOPY    . gastric banding undone  11/2014  . LAPAROSCOPIC CHOLECYSTECTOMY  ~ 2000  . LAPAROSCOPIC GASTRIC BANDING  2011  . LAPAROSCOPIC REVISION VENTRICULAR-PERITONEAL (V-P) SHUNT N/A 06/23/2017   Procedure: Shunt Placment stereotactic ventricular catheter placement and general surgery abdominal exposure  (Laparoscopic vs. Open) Brain Lab;  Surgeon: Clovis Riley, MD;  Location: [facility];  Service: General;  Laterality: N/A;  . LAPAROSCOPY N/A 06/23/2017   Procedure: LAPAROSCOPY DIAGNOSTIC;  Surgeon: Clovis Riley, MD;  Location: [facility];  Service: General;  Laterality: N/A;  . LUMBAR LAMINECTOMY/DECOMPRESSION MICRODISCECTOMY N/A 07/23/2016   Procedure: Decompressive Lumbar Laminectomy Lumbar two-three  for Repair of  Cerberal Spinal Fluid  Leak;  Surgeon: Kary Kos, MD;  Location: [facility];  Service: Neurosurgery;  Laterality: N/A;  . LUMBAR LAMINECTOMY/DECOMPRESSION MICRODISCECTOMY Left 03/01/2019   Procedure: LEFT LUMBAR THREE-FOUR EXTRAFORAMINAL MICRODISCECTOMY;  Surgeon: Kary Kos, MD;  Location: [facility];  Service: Neurosurgery;  Laterality: Left;  . PLACEMENT LUMBOPERITONEAL SHUNT FOR CEREBROSPINAL FLUID DIVERSION AWAY FROM C8 PERINEURAL CYST  09-05-2003  &  12-24-2003   08-31-2003  REMOVAL SHUNT  . POSTERIOR CERVICAL LAMINECTOMY  03-01-2001   C7-8 and Exploration C8 perineural cyst  . POSTERIOR LUMBAR LAMINECTOMY L4-5/  DISKECTOMY L5-S1  05-20-2005  . RE-DO DECOMPRESSION LAMINECTOMY AND FUSION L4-5  &  L5-S1  12-20-2006   02-04-2007--  RE-EXPLORATION L4 to S1, I & D LUMBAR WOUND HEMATOMA  . REDUCTION MAMMAPLASTY    . SHUNT REMOVAL N/A 07/10/2016   Procedure: SHUNT REMOVAL;  Surgeon: Kary Kos, MD;  Location: [facility];  Service: Neurosurgery;  Laterality: N/A;  SHUNT REMOVAL  . SPINAL CORD STIMULATOR INSERTION N/A 04/26/2015   Procedure: LUMBAR SPINAL CORD STIMULATOR INSERTION;  Surgeon: Clydell Hakim, MD;  Location: [facility];  Service: Neurosurgery;   Laterality: N/A;  LUMBAR SPINAL CORD STIMULATOR INSERTION  . SPINAL CORD STIMULATOR REMOVAL N/A 06/28/2019   Procedure: Removal of dorsal column stimulator and generator -;  Surgeon: Kary Kos, MD;  Location: [facility];  Service: Neurosurgery;  Laterality: N/A;  . TENNIS ELBOW RELEASE/NIRSCHEL PROCEDURE Left 12/13/2014   Procedure: LEFT ELBOW LATERAL EPICONDYLAR DEBRIDEMENT AND OR REPAIR -RECONSTRUCTION ;  Surgeon: Iran Planas, MD;  Location: [facility];  Service: Orthopedics;  Laterality: Left;  . TOTAL ABDOMINAL HYSTERECTOMY  1994   w/  Bilateral Salpinoophorectomy  . VENTRICULOPERITONEAL SHUNT N/A 06/23/2017   Procedure: VENTRICULAR-PERITONEAL SHUNT INSERTION WITH ABDOMINAL LAPARASCOPY AND BRAINLAB NAVIGATION;  Surgeon: Kary Kos, MD;  Location: [facility];  Service: Neurosurgery;  Laterality: N/A;    Family History  Problem Relation Age of Onset  . Depression Other   . Colon cancer Father   .  Ovarian cancer Maternal Grandmother   . Pancreatic cancer Maternal Grandfather     Social History   Socioeconomic History  . Marital status: Legally Separated    Spouse name: [name]  . Number of children: 2  . Years of education: Assoc  . Highest education level: Associate degree: academic program  Occupational History  . Occupation: Disabled   Tobacco Use  . Smoking status: Never Smoker  . Smokeless tobacco: Never Used  Substance and Sexual Activity  . Alcohol use: No  . Drug use: No  . Sexual activity: Not Currently    Birth control/protection: Surgical  Other Topics Concern  . Not on file  Social History Narrative   Caffeine: occasionally    Katrina Fernandez is married, on permanent disability and lives at home with her husband, her support system. She is independent in her care needs. She denies concern with transportation to medical appointments She is on a fixed income   Social Determinants of Health   Financial Resource Strain:   . Difficulty of Paying Living Expenses:  Not on file  Food Insecurity:   . Worried About Charity fundraiser in the Last Year: Not on file  . Ran Out of Food in the Last Year: Not on file  Transportation Needs: No Transportation Needs  . Lack of Transportation (Medical): No  . Lack of Transportation (Non-Medical): No  Physical Activity:   . Days of Exercise per Week: Not on file  . Minutes of Exercise per Session: Not on file  Stress: No Stress Concern Present  . Feeling of Stress : Only a little  Social Connections:   . Frequency of Communication with Friends and Family: Not on file  . Frequency of Social Gatherings with Friends and Family: Not on file  . Attends Religious Services: Not on file  . Active Member of Clubs or Organizations: Not on file  . Attends Archivist Meetings: Not on file  . Marital Status: Not on file  Intimate Partner Violence:   . Fear of Current or Ex-Partner: Not on file  . Emotionally Abused: Not on file  . Physically Abused: Not on file  . Sexually Abused: Not on file     Current Outpatient Medications:  .  busPIRone (BUSPAR) 10 MG tablet, TAKE 1 TABLET BY MOUTH THREE TIMES A DAY (Patient taking differently: Take 10 mg by mouth 3 (three) times daily as needed (anxiety). ), Disp: 270 tablet, Rfl: 1 .  celecoxib (CELEBREX) 200 MG capsule, Take 200 mg by mouth daily., Disp: , Rfl:  .  diclofenac sodium (VOLTAREN) 1 % GEL, Apply 4 g topically 4 (four) times daily. To affected joint. (Patient taking differently: Apply 4 g topically as needed (pain). To affected joint.), Disp: 100 g, Rfl: 11 .  DULoxetine (CYMBALTA) 60 MG capsule, TAKE 2 CAPSULES BY MOUTH EVERY DAY (Patient taking differently: Take 120 mg by mouth daily. ), Disp: 180 capsule, Rfl: 1 .  furosemide (LASIX) 40 MG tablet, TAKE 1 TABLET BY MOUTH TWICE A DAY (Patient taking differently: Take 40 mg by mouth daily. ), Disp: 180 tablet, Rfl: 1 .  methadone (DOLOPHINE) 5 MG tablet, Take 2.5 mg by mouth daily. , Disp: , Rfl: 0 .   montelukast (SINGULAIR) 10 MG tablet, TAKE 1 TABLET BY MOUTH EVERYDAY AT BEDTIME (Patient taking differently: Take 10 mg by mouth at bedtime. ), Disp: 90 tablet, Rfl: 1 .  NARCAN 4 MG/0.1ML LIQD nasal spray kit, Place 0.4 mg into the nose  once. , Disp: , Rfl:  .  oxyCODONE-acetaminophen (PERCOCET) 5-325 MG tablet, Take 1-2 tablets by mouth every 6 (six) hours as needed for severe pain., Disp: 20 tablet, Rfl: 0 .  OZEMPIC, 1 MG/DOSE, 2 MG/1.5ML SOPN, INJECT 1 MG INTO THE SKIN ONCE A WEEK. (Patient taking differently: Inject 1 mg into the skin once a week. Friday), Disp: 9 pen, Rfl: 1 .  polyethylene glycol (MIRALAX / GLYCOLAX) 17 g packet, Take 17 g by mouth daily as needed for mild constipation or moderate constipation., Disp: , Rfl:  .  pramipexole (MIRAPEX) 0.5 MG tablet, Take 1 tablet (0.5 mg total) by mouth at bedtime. (Patient taking differently: Take 0.5 mg by mouth at bedtime. Must be taken before by 18:30), Disp: 90 tablet, Rfl: 3 .  simvastatin (ZOCOR) 20 MG tablet, TAKE 1 TABLET BY MOUTH EVERY DAY (Patient taking differently: Take 20 mg by mouth at bedtime. ), Disp: 90 tablet, Rfl: 2 .  tiZANidine (ZANAFLEX) 4 MG capsule, Take 8 mg by mouth at bedtime. Take an additional 4 mg as needed for muscle spasm, Disp: , Rfl:  .  tiZANidine (ZANAFLEX) 4 MG tablet, Take 1 tablet (4 mg total) by mouth 3 (three) times daily., Disp: 90 tablet, Rfl: 1 .  traMADol (ULTRAM) 50 MG tablet, Take 50-100 mg by mouth every 8 (eight) hours as needed (Pain). , Disp: , Rfl:   EXAM:  VITALS per patient if applicable: Temp (!) 030.1 F (38.1 C) (Oral)   Ht _0  (1.6 m)   Wt 174 lb 8 oz (79.2 kg)   BMI 30.91 kg/m   GENERAL: alert, oriented, appears well and in no acute distress  HEENT: atraumatic, conjunttiva clear, no obvious abnormalities on inspection of external nose and ears  NECK: normal movements of the head and neck  LUNGS: on inspection no signs of respiratory distress, breathing rate appears  normal, no obvious gross SOB, gasping or wheezing  CV: no obvious cyanosis  MS: moves all visible extremities without noticeable abnormality  PSYCH/NEURO: pleasant and cooperative, no obvious depression or anxiety, speech and thought processing grossly intact  ASSESSMENT AND PLAN:  Discussed the following assessment and plan:  Viral illness Her current symptoms are consistent with viral process.  Daughter with similar symptoms last week and negative COVID test.  I think COVID is less likely at this time however we discussed that if symptoms continue to worsen or she develops new symptoms to please let me know.  She should continue supportive care and symptomatic management at home.     20 minutes spent including pre visit preparation, review of prior notes and labs, encounter with patient via video visit and same day documentation.     I discussed the assessment and treatment plan with the patient. The patient was provided an opportunity to ask questions and all were answered. The patient agreed with the plan and demonstrated an understanding of the instructions.   The patient was advised to call back or seek an in-person evaluation if the symptoms worsen or if the condition fails to improve as anticipated.    Luetta Nutting, DO

## 2019-07-20 DIAGNOSIS — W19XXXA Unspecified fall, initial encounter: Secondary | ICD-10-CM | POA: Diagnosis not present

## 2019-07-20 DIAGNOSIS — R55 Syncope and collapse: Secondary | ICD-10-CM | POA: Diagnosis not present

## 2019-07-20 DIAGNOSIS — U071 COVID-19: Secondary | ICD-10-CM | POA: Diagnosis not present

## 2019-07-20 DIAGNOSIS — E876 Hypokalemia: Secondary | ICD-10-CM | POA: Diagnosis not present

## 2019-07-20 DIAGNOSIS — I959 Hypotension, unspecified: Secondary | ICD-10-CM | POA: Diagnosis not present

## 2019-07-20 DIAGNOSIS — E785 Hyperlipidemia, unspecified: Secondary | ICD-10-CM | POA: Diagnosis not present

## 2019-07-21 DIAGNOSIS — E876 Hypokalemia: Secondary | ICD-10-CM | POA: Diagnosis not present

## 2019-07-21 DIAGNOSIS — W19XXXA Unspecified fall, initial encounter: Secondary | ICD-10-CM | POA: Diagnosis not present

## 2019-07-21 DIAGNOSIS — E785 Hyperlipidemia, unspecified: Secondary | ICD-10-CM | POA: Diagnosis not present

## 2019-07-21 DIAGNOSIS — U071 COVID-19: Secondary | ICD-10-CM | POA: Diagnosis not present

## 2019-07-22 DIAGNOSIS — W19XXXA Unspecified fall, initial encounter: Secondary | ICD-10-CM | POA: Diagnosis not present

## 2019-07-22 DIAGNOSIS — U071 COVID-19: Secondary | ICD-10-CM | POA: Diagnosis not present

## 2019-07-22 DIAGNOSIS — Z86718 Personal history of other venous thrombosis and embolism: Secondary | ICD-10-CM | POA: Diagnosis not present

## 2019-07-22 DIAGNOSIS — E785 Hyperlipidemia, unspecified: Secondary | ICD-10-CM | POA: Diagnosis not present

## 2019-07-22 DIAGNOSIS — E876 Hypokalemia: Secondary | ICD-10-CM | POA: Diagnosis not present

## 2019-07-22 DIAGNOSIS — I959 Hypotension, unspecified: Secondary | ICD-10-CM | POA: Diagnosis not present

## 2019-07-23 DIAGNOSIS — E785 Hyperlipidemia, unspecified: Secondary | ICD-10-CM | POA: Diagnosis not present

## 2019-07-23 DIAGNOSIS — U071 COVID-19: Secondary | ICD-10-CM | POA: Diagnosis not present

## 2019-07-23 DIAGNOSIS — W19XXXA Unspecified fall, initial encounter: Secondary | ICD-10-CM | POA: Diagnosis not present

## 2019-07-23 MED ORDER — POLYETHYLENE GLYCOL 3350 17 GM/SCOOP PO POWD
17.00 | ORAL | Status: DC
Start: 2019-07-24 — End: 2019-07-23

## 2019-07-23 MED ORDER — CELECOXIB 200 MG PO CAPS
200.00 | ORAL_CAPSULE | ORAL | Status: DC
Start: 2019-07-24 — End: 2019-07-23

## 2019-07-23 MED ORDER — SALINE NASAL SPRAY 0.65 % NA SOLN
1.00 | NASAL | Status: DC
Start: ? — End: 2019-07-23

## 2019-07-23 MED ORDER — METHADONE HCL 5 MG/5ML PO SOLN
2.50 | ORAL | Status: DC
Start: 2019-07-24 — End: 2019-07-23

## 2019-07-23 MED ORDER — GUAIFENESIN ER 600 MG PO TB12
600.00 | ORAL_TABLET | ORAL | Status: DC
Start: 2019-07-23 — End: 2019-07-23

## 2019-07-23 MED ORDER — PRAVASTATIN SODIUM 40 MG PO TABS
40.00 | ORAL_TABLET | ORAL | Status: DC
Start: 2019-07-23 — End: 2019-07-23

## 2019-07-23 MED ORDER — METHOCARBAMOL 500 MG PO TABS
500.00 | ORAL_TABLET | ORAL | Status: DC
Start: ? — End: 2019-07-23

## 2019-07-23 MED ORDER — LIDOCAINE 4 % EX PTCH
1.00 | MEDICATED_PATCH | CUTANEOUS | Status: DC
Start: 2019-07-23 — End: 2019-07-23

## 2019-07-23 MED ORDER — DULOXETINE HCL 60 MG PO CPEP
120.00 | ORAL_CAPSULE | ORAL | Status: DC
Start: 2019-07-24 — End: 2019-07-23

## 2019-07-23 MED ORDER — TRAMADOL HCL 50 MG PO TABS
100.00 | ORAL_TABLET | ORAL | Status: DC
Start: ? — End: 2019-07-23

## 2019-07-23 MED ORDER — ENOXAPARIN SODIUM 40 MG/0.4ML ~~LOC~~ SOLN
40.00 | SUBCUTANEOUS | Status: DC
Start: 2019-07-24 — End: 2019-07-23

## 2019-07-23 MED ORDER — PRAMIPEXOLE DIHYDROCHLORIDE 0.25 MG PO TABS
0.50 | ORAL_TABLET | ORAL | Status: DC
Start: 2019-07-23 — End: 2019-07-23

## 2019-07-23 MED ORDER — FUROSEMIDE 20 MG PO TABS
20.00 | ORAL_TABLET | ORAL | Status: DC
Start: 2019-07-24 — End: 2019-07-23

## 2019-07-23 MED ORDER — MONTELUKAST SODIUM 10 MG PO TABS
10.00 | ORAL_TABLET | ORAL | Status: DC
Start: 2019-07-24 — End: 2019-07-23

## 2019-07-23 MED ORDER — ACETAMINOPHEN 325 MG PO TABS
650.00 | ORAL_TABLET | ORAL | Status: DC
Start: ? — End: 2019-07-23

## 2019-07-26 ENCOUNTER — Telehealth (INDEPENDENT_AMBULATORY_CARE_PROVIDER_SITE_OTHER): Payer: Medicare HMO | Admitting: Family Medicine

## 2019-07-26 ENCOUNTER — Encounter: Payer: Self-pay | Admitting: Family Medicine

## 2019-07-26 DIAGNOSIS — Z4789 Encounter for other orthopedic aftercare: Secondary | ICD-10-CM | POA: Diagnosis not present

## 2019-07-26 DIAGNOSIS — R55 Syncope and collapse: Secondary | ICD-10-CM

## 2019-07-26 DIAGNOSIS — U071 COVID-19: Secondary | ICD-10-CM

## 2019-07-26 DIAGNOSIS — I872 Venous insufficiency (chronic) (peripheral): Secondary | ICD-10-CM | POA: Diagnosis not present

## 2019-07-26 DIAGNOSIS — R69 Illness, unspecified: Secondary | ICD-10-CM | POA: Diagnosis not present

## 2019-07-26 DIAGNOSIS — J309 Allergic rhinitis, unspecified: Secondary | ICD-10-CM | POA: Diagnosis not present

## 2019-07-26 DIAGNOSIS — G8929 Other chronic pain: Secondary | ICD-10-CM | POA: Diagnosis not present

## 2019-07-26 DIAGNOSIS — G4700 Insomnia, unspecified: Secondary | ICD-10-CM | POA: Diagnosis not present

## 2019-07-26 DIAGNOSIS — M179 Osteoarthritis of knee, unspecified: Secondary | ICD-10-CM | POA: Diagnosis not present

## 2019-07-26 DIAGNOSIS — I10 Essential (primary) hypertension: Secondary | ICD-10-CM | POA: Diagnosis not present

## 2019-07-26 NOTE — Progress Notes (Signed)
Katrina Fernandez - 54 y.o. female MRN [id number]  Date of birth: [birth year]   This visit type was conducted due to national recommendations for restrictions regarding the COVID-19 Pandemic (e.g. social distancing).  This format is felt to be most appropriate for this patient at this time.  All issues noted in this document were discussed and addressed.  No physical exam was performed (except for noted visual exam findings with Video Visits).  I discussed the limitations of evaluation and management by telemedicine and the availability of in person appointments. The patient expressed understanding and agreed to proceed.  I connected with@ on 07/26/19 at  3:40 PM EST by a video enabled telemedicine application and verified that I am speaking with the correct person using two identifiers.  Present at visit: Katrina Nutting, DO Katrina Fernandez   Patient Location: [place] [address]   Provider location:   [facility]  Chief Complaint  Patient presents with  . Hospitalization Follow-up    HPI  Katrina Fernandez is a 54 y.o. female who presents via audio/video conferencing for a telehealth visit today.  She is following up for recent hospitalization.  She was seen via virtual visit last week for URI symptoms.  Had syncopal episode that evening and was taken to ED at [facility].  She reports that BP was 50/30.  She was provided with fluids in the ED and admitted for overnight stay. Her test for COVID-19 returned positive as well.  She was evaluated by PT.  Furosemide has been held and tizanidine was changed to robaxin.  She reports that she is feeling much better today.  She is having some neck pain and headaches, improved with robaxin.  She has [facility] coming out to work with her.  She denies dizziness with standing or increased fatigue at this time. She continues to have some mild congestion but this is improving.    ROS:  A comprehensive ROS was completed and negative  except as noted per HPI  Past Medical History:  Diagnosis Date  . Anemia   . Anxiety   . Arthritis    "back and knees" (07/23/2016)  . Chiari malformation type I ([place]) dx'd 2014  . Chronic back pain    buldging, DDD; "neck and lower back" (07/23/2016)  . Chronic constipation    takes Miralax daily  . Chronic pain syndrome    takes Methadone and Keppra daily  . Depression    takes Effexor daily  . Endometriosis    1995  . Fibromyalgia   . GERD (gastroesophageal reflux disease)    not taking anything at the present time  . History of DVT of lower extremity 07/2012   LEFT LEG --  3/ 2014.Was on a blood transfusion for 6 months  . History of kidney stones   . History of MRSA infection 10 yrs ago  . Hyperlipidemia    takes Simvastatin daily  . Migraine    "daily recently" (07/23/2016)  . Muscle spasm    takes Zanaflex nightly  . Peripheral edema    takes Lasix daily as needed  . Peripheral neuropathy   . Pleurisy 2000  . Pneumonia    hx of-7-8 yrs ago  . PONV (postoperative nausea and vomiting)   . Seasonal allergies    takes Claritin daily  . Spinal headache    "since 2002; still get them; try to manage it w/lying flat"  . Weakness    and numbness in right  hand    Past Surgical History:  Procedure Laterality Date  . ANTERIOR CERVICAL DECOMP/DISCECTOMY FUSION  07-25-2009   C5 -- C6  . APPENDECTOMY  age 80  . APPLICATION OF CRANIAL NAVIGATION N/A 06/23/2017   Procedure: APPLICATION OF CRANIAL NAVIGATION;  Surgeon: Kary Kos, MD;  Location: [place];  Service: Neurosurgery;  Laterality: N/A;  . BACK SURGERY    . BRAIN SURGERY     VP shunt  . BREAST REDUCTION SURGERY Bilateral 06/07/2013   Procedure: BILATERAL MAMMARY REDUCTION  (BREAST);  Surgeon: Theodoro Kos, DO;  Location: [place];  Service: Plastics;  Laterality: Bilateral;  . BREAST REDUCTION SURGERY Bilateral 06/07/2013   Procedure:  LIPOSUCTION;  Surgeon: Theodoro Kos, DO;  Location: [place];  Service: Plastics;  Laterality: Bilateral;  . CARPAL TUNNEL RELEASE Bilateral 2001  . COLONOSCOPY  X 2   "polyps were benign"  . DILATION AND CURETTAGE OF UTERUS    . ESOPHAGOGASTRODUODENOSCOPY    . gastric banding undone  11/2014  . LAPAROSCOPIC CHOLECYSTECTOMY  ~ 2000  . LAPAROSCOPIC GASTRIC BANDING  2011  . LAPAROSCOPIC REVISION VENTRICULAR-PERITONEAL (V-P) SHUNT N/A 06/23/2017   Procedure: Shunt Placment stereotactic ventricular catheter placement and general surgery abdominal exposure  (Laparoscopic vs. Open) Brain Lab;  Surgeon: Clovis Riley, MD;  Location: [place];  Service: General;  Laterality: N/A;  . LAPAROSCOPY N/A 06/23/2017   Procedure: LAPAROSCOPY DIAGNOSTIC;  Surgeon: Clovis Riley, MD;  Location: [place];  Service: General;  Laterality: N/A;  . LUMBAR LAMINECTOMY/DECOMPRESSION MICRODISCECTOMY N/A 07/23/2016   Procedure: Decompressive Lumbar Laminectomy Lumbar two-three  for Repair of  Cerberal Spinal Fluid  Leak;  Surgeon: Kary Kos, MD;  Location: [place];  Service: Neurosurgery;  Laterality: N/A;  . LUMBAR LAMINECTOMY/DECOMPRESSION MICRODISCECTOMY Left 03/01/2019   Procedure: LEFT LUMBAR THREE-FOUR EXTRAFORAMINAL MICRODISCECTOMY;  Surgeon: Kary Kos, MD;  Location: [place];  Service: Neurosurgery;  Laterality: Left;  . PLACEMENT LUMBOPERITONEAL SHUNT FOR CEREBROSPINAL FLUID DIVERSION AWAY FROM C8 PERINEURAL CYST  09-05-2003  &  12-24-2003   08-31-2003  REMOVAL SHUNT  . POSTERIOR CERVICAL LAMINECTOMY  03-01-2001   C7-8 and Exploration C8 perineural cyst  . POSTERIOR LUMBAR LAMINECTOMY L4-5/  DISKECTOMY L5-S1  05-20-2005  . RE-DO DECOMPRESSION LAMINECTOMY AND FUSION L4-5  &  L5-S1  12-20-2006   02-04-2007--  RE-EXPLORATION L4 to S1, I & D LUMBAR WOUND HEMATOMA  . REDUCTION MAMMAPLASTY    . SHUNT REMOVAL N/A 07/10/2016   Procedure: SHUNT REMOVAL;  Surgeon: Kary Kos, MD;  Location: [place];  Service: Neurosurgery;  Laterality: N/A;  SHUNT REMOVAL  . SPINAL CORD  STIMULATOR INSERTION N/A 04/26/2015   Procedure: LUMBAR SPINAL CORD STIMULATOR INSERTION;  Surgeon: Clydell Hakim, MD;  Location: [place] [facility];  Service: Neurosurgery;  Laterality: N/A;  LUMBAR SPINAL CORD STIMULATOR INSERTION  . SPINAL CORD STIMULATOR REMOVAL N/A 06/28/2019   Procedure: Removal of dorsal column stimulator and generator -;  Surgeon: Kary Kos, MD;  Location: [place];  Service: Neurosurgery;  Laterality: N/A;  . TENNIS ELBOW RELEASE/NIRSCHEL PROCEDURE Left 12/13/2014   Procedure: LEFT ELBOW LATERAL EPICONDYLAR DEBRIDEMENT AND OR REPAIR -RECONSTRUCTION ;  Surgeon: Iran Planas, MD;  Location: [place];  Service: Orthopedics;  Laterality: Left;  . TOTAL ABDOMINAL HYSTERECTOMY  1994   w/  Bilateral Salpinoophorectomy  . VENTRICULOPERITONEAL SHUNT N/A 06/23/2017   Procedure: VENTRICULAR-PERITONEAL SHUNT INSERTION WITH ABDOMINAL LAPARASCOPY AND BRAINLAB NAVIGATION;  Surgeon: Kary Kos, MD;  Location: [place];  Service:  Neurosurgery;  Laterality: N/A;    Family History  Problem Relation Age of Onset  . Depression Other   . Colon cancer Father   . Ovarian cancer Maternal Grandmother   . Pancreatic cancer Maternal Grandfather     Social History   Socioeconomic History  . Marital status: Legally Separated    Spouse name: [name]  . Number of children: 2  . Years of education: Assoc  . Highest education level: Associate degree: academic program  Occupational History  . Occupation: Disabled   Tobacco Use  . Smoking status: Never Smoker  . Smokeless tobacco: Never Used  Substance and Sexual Activity  . Alcohol use: No  . Drug use: No  . Sexual activity: Not Currently    Birth control/protection: Surgical  Other Topics Concern  . Not on file  Social History Narrative   Caffeine: occasionally    Mrs Patti is married, on permanent disability and lives at home with her husband, her support system. She is independent in her care needs. She denies concern with  transportation to medical appointments She is on a fixed income   Social Determinants of Health   Financial Resource Strain:   . Difficulty of Paying Living Expenses: Not on file  Food Insecurity:   . Worried About Charity fundraiser in the Last Year: Not on file  . Ran Out of Food in the Last Year: Not on file  Transportation Needs: No Transportation Needs  . Lack of Transportation (Medical): No  . Lack of Transportation (Non-Medical): No  Physical Activity:   . Days of Exercise per Week: Not on file  . Minutes of Exercise per Session: Not on file  Stress: No Stress Concern Present  . Feeling of Stress : Only a little  Social Connections:   . Frequency of Communication with Friends and Family: Not on file  . Frequency of Social Gatherings with Friends and Family: Not on file  . Attends Religious Services: Not on file  . Active Member of Clubs or Organizations: Not on file  . Attends Archivist Meetings: Not on file  . Marital Status: Not on file  Intimate Partner Violence:   . Fear of Current or Ex-Partner: Not on file  . Emotionally Abused: Not on file  . Physically Abused: Not on file  . Sexually Abused: Not on file     Current Outpatient Medications:  .  busPIRone (BUSPAR) 10 MG tablet, TAKE 1 TABLET BY MOUTH THREE TIMES A DAY (Patient taking differently: Take 10 mg by mouth 3 (three) times daily as needed (anxiety). ), Disp: 270 tablet, Rfl: 1 .  celecoxib (CELEBREX) 200 MG capsule, Take 200 mg by mouth daily., Disp: , Rfl:  .  diclofenac sodium (VOLTAREN) 1 % GEL, Apply 4 g topically 4 (four) times daily. To affected joint. (Patient taking differently: Apply 4 g topically as needed (pain). To affected joint.), Disp: 100 g, Rfl: 11 .  DULoxetine (CYMBALTA) 60 MG capsule, TAKE 2 CAPSULES BY MOUTH EVERY DAY (Patient taking differently: Take 120 mg by mouth daily. ), Disp: 180 capsule, Rfl: 1 .  furosemide (LASIX) 40 MG tablet, TAKE 1 TABLET BY MOUTH TWICE A DAY  (Patient taking differently: Take 40 mg by mouth as needed. Hospital changed), Disp: 180 tablet, Rfl: 1 .  methadone (DOLOPHINE) 5 MG tablet, Take 2.5 mg by mouth daily. , Disp: , Rfl: 0 .  methocarbamol (ROBAXIN) 500 MG tablet, Take 500 mg by mouth every 6 (six) hours  as needed for muscle spasms., Disp: , Rfl:  .  montelukast (SINGULAIR) 10 MG tablet, TAKE 1 TABLET BY MOUTH EVERYDAY AT BEDTIME (Patient taking differently: Take 10 mg by mouth at bedtime. ), Disp: 90 tablet, Rfl: 1 .  NARCAN 4 MG/0.1ML LIQD nasal spray kit, Place 0.4 mg into the nose once. , Disp: , Rfl:  .  oxyCODONE-acetaminophen (PERCOCET) 5-325 MG tablet, Take 1-2 tablets by mouth every 6 (six) hours as needed for severe pain., Disp: 20 tablet, Rfl: 0 .  OZEMPIC, 1 MG/DOSE, 2 MG/1.5ML SOPN, INJECT 1 MG INTO THE SKIN ONCE A WEEK. (Patient taking differently: Inject 1 mg into the skin once a week. Friday), Disp: 9 pen, Rfl: 1 .  polyethylene glycol (MIRALAX / GLYCOLAX) 17 g packet, Take 17 g by mouth daily as needed for mild constipation or moderate constipation., Disp: , Rfl:  .  pramipexole (MIRAPEX) 0.5 MG tablet, Take 1 tablet (0.5 mg total) by mouth at bedtime. (Patient taking differently: Take 0.5 mg by mouth at bedtime. Must be taken before by 18:30), Disp: 90 tablet, Rfl: 3 .  simvastatin (ZOCOR) 20 MG tablet, TAKE 1 TABLET BY MOUTH EVERY DAY (Patient taking differently: Take 20 mg by mouth at bedtime. ), Disp: 90 tablet, Rfl: 2 .  traMADol (ULTRAM) 50 MG tablet, Take 50-100 mg by mouth every 8 (eight) hours as needed (Pain). , Disp: , Rfl:   EXAM:  VITALS per patient if applicable: Temp (!) 97 F (36.1 C) (Oral) Comment: Patient reprorted form home health normal  GENERAL: alert, oriented, appears well and in no acute distress  HEENT: atraumatic, conjunttiva clear, no obvious abnormalities on inspection of external nose and ears  NECK: normal movements of the head and neck  LUNGS: on inspection no signs of respiratory  distress, breathing rate appears normal, no obvious gross SOB, gasping or wheezing  CV: no obvious cyanosis  MS: moves all visible extremities without noticeable abnormality  PSYCH/NEURO: pleasant and cooperative, no obvious depression or anxiety, speech and thought processing grossly intact  ASSESSMENT AND PLAN:  Discussed the following assessment and plan:  Syncopal episodes Multiple factors contributing including medications and dehydration from COVID and furosemide.  She feels much better with change in medications and holding furosemide.  She will continue to push fluids.  PT working with her.  They will keep an eye on BP as well.   COVID-19 Mild symptoms, recovering well.  She is aware to isolate for minimum of 10 days.  Must also be afebrile x24 hours and have improving symptoms.  She will call for any new or worsening symptoms.   25 minutes spent including pre visit preparation, review of prior notes and labs, encounter with patient via video visit and same day documentation.    I discussed the assessment and treatment plan with the patient. The patient was provided an opportunity to ask questions and all were answered. The patient agreed with the plan and demonstrated an understanding of the instructions.   The patient was advised to call back or seek an in-person evaluation if the symptoms worsen or if the condition fails to improve as anticipated.    Katrina Nutting, DO

## 2019-07-26 NOTE — Progress Notes (Signed)
Patient having a follow up for testing positive for Covid-19, at hospital.  No cough, no shortness of breath Body aches, sore throat,. sinus pressure No loss of taste or smell,  Sinus pressure.  Start having home health 2 times a week for now per patient.   She was taken off Tizanidine and put on methocarbamol.   She had a fall for BP of 50/30.She has a black eye.

## 2019-07-26 NOTE — Assessment & Plan Note (Signed)
Mild symptoms, recovering well.  She is aware to isolate for minimum of 10 days.  Must also be afebrile x24 hours and have improving symptoms.  She will call for any new or worsening symptoms.

## 2019-07-26 NOTE — Assessment & Plan Note (Signed)
Multiple factors contributing including medications and dehydration from COVID and furosemide.  She feels much better with change in medications and holding furosemide.  She will continue to push fluids.  PT working with her.  They will keep an eye on BP as well.

## 2019-07-27 ENCOUNTER — Telehealth: Payer: Self-pay | Admitting: Family Medicine

## 2019-07-27 NOTE — Telephone Encounter (Signed)
Ok to provide VO

## 2019-07-27 NOTE — Telephone Encounter (Signed)
Dianna from [facility] called and wants verbal orders for PT for this patient starting next week.   Dianna is requesting that this will be 2 times a a week for 2 weeks and then once a week for 2 weeks.   Please advise if ok to give verbal orders.

## 2019-07-27 NOTE — Telephone Encounter (Signed)
I called Katrina Fernandez and gave her the verbal orders per PCP. No other questions.

## 2019-08-01 ENCOUNTER — Ambulatory Visit (INDEPENDENT_AMBULATORY_CARE_PROVIDER_SITE_OTHER): Payer: Medicare HMO | Admitting: Psychology

## 2019-08-01 DIAGNOSIS — I872 Venous insufficiency (chronic) (peripheral): Secondary | ICD-10-CM | POA: Diagnosis not present

## 2019-08-01 DIAGNOSIS — Z4789 Encounter for other orthopedic aftercare: Secondary | ICD-10-CM | POA: Diagnosis not present

## 2019-08-01 DIAGNOSIS — F4323 Adjustment disorder with mixed anxiety and depressed mood: Secondary | ICD-10-CM

## 2019-08-01 DIAGNOSIS — J309 Allergic rhinitis, unspecified: Secondary | ICD-10-CM | POA: Diagnosis not present

## 2019-08-01 DIAGNOSIS — I10 Essential (primary) hypertension: Secondary | ICD-10-CM | POA: Diagnosis not present

## 2019-08-01 DIAGNOSIS — U071 COVID-19: Secondary | ICD-10-CM | POA: Diagnosis not present

## 2019-08-01 DIAGNOSIS — G8929 Other chronic pain: Secondary | ICD-10-CM | POA: Diagnosis not present

## 2019-08-01 DIAGNOSIS — M179 Osteoarthritis of knee, unspecified: Secondary | ICD-10-CM | POA: Diagnosis not present

## 2019-08-01 DIAGNOSIS — F411 Generalized anxiety disorder: Secondary | ICD-10-CM

## 2019-08-01 DIAGNOSIS — G4700 Insomnia, unspecified: Secondary | ICD-10-CM | POA: Diagnosis not present

## 2019-08-01 DIAGNOSIS — R69 Illness, unspecified: Secondary | ICD-10-CM | POA: Diagnosis not present

## 2019-08-03 DIAGNOSIS — Z79899 Other long term (current) drug therapy: Secondary | ICD-10-CM | POA: Diagnosis not present

## 2019-08-03 DIAGNOSIS — M5126 Other intervertebral disc displacement, lumbar region: Secondary | ICD-10-CM | POA: Diagnosis not present

## 2019-08-03 DIAGNOSIS — G894 Chronic pain syndrome: Secondary | ICD-10-CM | POA: Diagnosis not present

## 2019-08-03 DIAGNOSIS — M542 Cervicalgia: Secondary | ICD-10-CM | POA: Diagnosis not present

## 2019-08-03 DIAGNOSIS — Z5181 Encounter for therapeutic drug level monitoring: Secondary | ICD-10-CM | POA: Diagnosis not present

## 2019-08-03 DIAGNOSIS — M5440 Lumbago with sciatica, unspecified side: Secondary | ICD-10-CM | POA: Diagnosis not present

## 2019-08-03 DIAGNOSIS — M961 Postlaminectomy syndrome, not elsewhere classified: Secondary | ICD-10-CM | POA: Diagnosis not present

## 2019-08-08 ENCOUNTER — Other Ambulatory Visit: Payer: Self-pay | Admitting: Family Medicine

## 2019-08-08 DIAGNOSIS — G8929 Other chronic pain: Secondary | ICD-10-CM | POA: Diagnosis not present

## 2019-08-08 DIAGNOSIS — R69 Illness, unspecified: Secondary | ICD-10-CM | POA: Diagnosis not present

## 2019-08-08 DIAGNOSIS — G4700 Insomnia, unspecified: Secondary | ICD-10-CM | POA: Diagnosis not present

## 2019-08-08 DIAGNOSIS — U071 COVID-19: Secondary | ICD-10-CM | POA: Diagnosis not present

## 2019-08-08 DIAGNOSIS — M179 Osteoarthritis of knee, unspecified: Secondary | ICD-10-CM | POA: Diagnosis not present

## 2019-08-08 DIAGNOSIS — Z4789 Encounter for other orthopedic aftercare: Secondary | ICD-10-CM | POA: Diagnosis not present

## 2019-08-08 DIAGNOSIS — I872 Venous insufficiency (chronic) (peripheral): Secondary | ICD-10-CM | POA: Diagnosis not present

## 2019-08-08 DIAGNOSIS — J309 Allergic rhinitis, unspecified: Secondary | ICD-10-CM | POA: Diagnosis not present

## 2019-08-08 DIAGNOSIS — I10 Essential (primary) hypertension: Secondary | ICD-10-CM | POA: Diagnosis not present

## 2019-08-11 DIAGNOSIS — I10 Essential (primary) hypertension: Secondary | ICD-10-CM | POA: Diagnosis not present

## 2019-08-11 DIAGNOSIS — G4700 Insomnia, unspecified: Secondary | ICD-10-CM | POA: Diagnosis not present

## 2019-08-11 DIAGNOSIS — M179 Osteoarthritis of knee, unspecified: Secondary | ICD-10-CM | POA: Diagnosis not present

## 2019-08-11 DIAGNOSIS — I872 Venous insufficiency (chronic) (peripheral): Secondary | ICD-10-CM | POA: Diagnosis not present

## 2019-08-11 DIAGNOSIS — R69 Illness, unspecified: Secondary | ICD-10-CM | POA: Diagnosis not present

## 2019-08-11 DIAGNOSIS — U071 COVID-19: Secondary | ICD-10-CM | POA: Diagnosis not present

## 2019-08-11 DIAGNOSIS — J309 Allergic rhinitis, unspecified: Secondary | ICD-10-CM | POA: Diagnosis not present

## 2019-08-11 DIAGNOSIS — G8929 Other chronic pain: Secondary | ICD-10-CM | POA: Diagnosis not present

## 2019-08-11 DIAGNOSIS — Z4789 Encounter for other orthopedic aftercare: Secondary | ICD-10-CM | POA: Diagnosis not present

## 2019-08-15 ENCOUNTER — Ambulatory Visit (INDEPENDENT_AMBULATORY_CARE_PROVIDER_SITE_OTHER): Payer: Medicare HMO | Admitting: Psychology

## 2019-08-15 DIAGNOSIS — R69 Illness, unspecified: Secondary | ICD-10-CM | POA: Diagnosis not present

## 2019-08-15 DIAGNOSIS — F4323 Adjustment disorder with mixed anxiety and depressed mood: Secondary | ICD-10-CM

## 2019-08-15 DIAGNOSIS — F411 Generalized anxiety disorder: Secondary | ICD-10-CM

## 2019-08-17 DIAGNOSIS — G4700 Insomnia, unspecified: Secondary | ICD-10-CM | POA: Diagnosis not present

## 2019-08-17 DIAGNOSIS — R69 Illness, unspecified: Secondary | ICD-10-CM | POA: Diagnosis not present

## 2019-08-17 DIAGNOSIS — I872 Venous insufficiency (chronic) (peripheral): Secondary | ICD-10-CM | POA: Diagnosis not present

## 2019-08-17 DIAGNOSIS — I10 Essential (primary) hypertension: Secondary | ICD-10-CM | POA: Diagnosis not present

## 2019-08-17 DIAGNOSIS — M179 Osteoarthritis of knee, unspecified: Secondary | ICD-10-CM | POA: Diagnosis not present

## 2019-08-17 DIAGNOSIS — U071 COVID-19: Secondary | ICD-10-CM | POA: Diagnosis not present

## 2019-08-17 DIAGNOSIS — G8929 Other chronic pain: Secondary | ICD-10-CM | POA: Diagnosis not present

## 2019-08-17 DIAGNOSIS — Z4789 Encounter for other orthopedic aftercare: Secondary | ICD-10-CM | POA: Diagnosis not present

## 2019-08-17 DIAGNOSIS — J309 Allergic rhinitis, unspecified: Secondary | ICD-10-CM | POA: Diagnosis not present

## 2019-08-18 ENCOUNTER — Other Ambulatory Visit: Payer: Self-pay | Admitting: Family Medicine

## 2019-08-23 DIAGNOSIS — G4700 Insomnia, unspecified: Secondary | ICD-10-CM | POA: Diagnosis not present

## 2019-08-23 DIAGNOSIS — G8929 Other chronic pain: Secondary | ICD-10-CM | POA: Diagnosis not present

## 2019-08-23 DIAGNOSIS — I872 Venous insufficiency (chronic) (peripheral): Secondary | ICD-10-CM | POA: Diagnosis not present

## 2019-08-23 DIAGNOSIS — Z4789 Encounter for other orthopedic aftercare: Secondary | ICD-10-CM | POA: Diagnosis not present

## 2019-08-23 DIAGNOSIS — J309 Allergic rhinitis, unspecified: Secondary | ICD-10-CM | POA: Diagnosis not present

## 2019-08-23 DIAGNOSIS — M179 Osteoarthritis of knee, unspecified: Secondary | ICD-10-CM | POA: Diagnosis not present

## 2019-08-23 DIAGNOSIS — U071 COVID-19: Secondary | ICD-10-CM | POA: Diagnosis not present

## 2019-08-23 DIAGNOSIS — I10 Essential (primary) hypertension: Secondary | ICD-10-CM | POA: Diagnosis not present

## 2019-08-23 DIAGNOSIS — R69 Illness, unspecified: Secondary | ICD-10-CM | POA: Diagnosis not present

## 2019-08-29 ENCOUNTER — Ambulatory Visit (INDEPENDENT_AMBULATORY_CARE_PROVIDER_SITE_OTHER): Payer: Medicare HMO | Admitting: Psychology

## 2019-08-29 DIAGNOSIS — F411 Generalized anxiety disorder: Secondary | ICD-10-CM | POA: Diagnosis not present

## 2019-08-29 DIAGNOSIS — F4323 Adjustment disorder with mixed anxiety and depressed mood: Secondary | ICD-10-CM | POA: Diagnosis not present

## 2019-08-29 DIAGNOSIS — R69 Illness, unspecified: Secondary | ICD-10-CM | POA: Diagnosis not present

## 2019-09-12 ENCOUNTER — Ambulatory Visit (INDEPENDENT_AMBULATORY_CARE_PROVIDER_SITE_OTHER): Payer: Medicare HMO | Admitting: Psychology

## 2019-09-12 ENCOUNTER — Telehealth (INDEPENDENT_AMBULATORY_CARE_PROVIDER_SITE_OTHER): Payer: Medicare HMO | Admitting: Family Medicine

## 2019-09-12 ENCOUNTER — Encounter: Payer: Self-pay | Admitting: Family Medicine

## 2019-09-12 DIAGNOSIS — F411 Generalized anxiety disorder: Secondary | ICD-10-CM | POA: Diagnosis not present

## 2019-09-12 DIAGNOSIS — J40 Bronchitis, not specified as acute or chronic: Secondary | ICD-10-CM | POA: Insufficient documentation

## 2019-09-12 DIAGNOSIS — F4323 Adjustment disorder with mixed anxiety and depressed mood: Secondary | ICD-10-CM

## 2019-09-12 DIAGNOSIS — R69 Illness, unspecified: Secondary | ICD-10-CM | POA: Diagnosis not present

## 2019-09-12 MED ORDER — PREDNISONE 20 MG PO TABS: 20.0000 mg | ORAL_TABLET | Freq: Two times a day (BID) | ORAL | 0 refills | Status: AC

## 2019-09-12 MED ORDER — AZITHROMYCIN 250 MG PO TABS: ORAL_TABLET | ORAL | 0 refills | Status: DC

## 2019-09-12 NOTE — Assessment & Plan Note (Addendum)
Recommend increased fluids and addition of mucinex.  Continue antihistamine.  I am adding 5 days of prednisone and will cover for bacterial component with azithromycin since she has had prolonged symptoms  She will let me know if having any worsening symptoms or if symptoms are not improving.

## 2019-09-12 NOTE — Progress Notes (Signed)
Katrina Fernandez - 54 y.o. female MRN [id number]  Date of birth: 1965-11-30   This visit type was conducted due to national recommendations for restrictions regarding the COVID-19 Pandemic (e.g. social distancing).  This format is felt to be most appropriate for this patient at this time.  All issues noted in this document were discussed and addressed.  No physical exam was performed (except for noted visual exam findings with Video Visits).  I discussed the limitations of evaluation and management by telemedicine and the availability of in person appointments. The patient expressed understanding and agreed to proceed.  I connected with@ on 09/12/19 at  8:10 AM EDT by a video enabled telemedicine application and verified that I am speaking with the correct person using two identifiers.  Present at visit: Luetta Nutting, DO [name]   Patient Location: [place] [address]   Provider location:   [place]  No chief complaint on file.   HPI  Katrina Fernandez is a 54 y.o. female who presents via audio/video conferencing for a telehealth visit today.  She has complaint of cough x2 weeks.  Cough has become productive of green/yellow sputum.  She also has some hoarseness.  She denies fever, chills, shortness of breath, headache or sinus pain.  She has COVID in March and had recovered well from this prior to start of current symptoms.  She does have seasonal allergies and is taking OTC antihistamine.  Otherwise she has not tried anything else.     ROS:  A comprehensive ROS was completed and negative except as noted per HPI  Past Medical History:  Diagnosis Date  . Anemia   . Anxiety   . Arthritis    "back and knees" (07/23/2016)  . Chiari malformation type I ([place]) dx'd 2014  . Chronic back pain    buldging, DDD; "neck and lower back" (07/23/2016)  . Chronic constipation    takes Miralax daily  . Chronic pain syndrome    takes Methadone and Keppra daily  .  Depression    takes Effexor daily  . Endometriosis    1995  . Fibromyalgia   . GERD (gastroesophageal reflux disease)    not taking anything at the present time  . History of DVT of lower extremity 07/2012   LEFT LEG --  3/ 2014.Was on a blood transfusion for 6 months  . History of kidney stones   . History of MRSA infection 10 yrs ago  . Hyperlipidemia    takes Simvastatin daily  . Migraine    "daily recently" (07/23/2016)  . Muscle spasm    takes Zanaflex nightly  . Peripheral edema    takes Lasix daily as needed  . Peripheral neuropathy   . Pleurisy 2000  . Pneumonia    hx of-7-8 yrs ago  . PONV (postoperative nausea and vomiting)   . Seasonal allergies    takes Claritin daily  . Spinal headache    "since 2002; still get them; try to manage it w/lying flat"  . Weakness    and numbness in right hand    Past Surgical History:  Procedure Laterality Date  . ANTERIOR CERVICAL DECOMP/DISCECTOMY FUSION  07-25-2009   C5 -- C6  . APPENDECTOMY  age 72  . APPLICATION OF CRANIAL NAVIGATION N/A 06/23/2017   Procedure: APPLICATION OF CRANIAL NAVIGATION;  Surgeon: Kary Kos, MD;  Location: [facility];  Service: Neurosurgery;  Laterality: N/A;  . BACK SURGERY    . BRAIN SURGERY  VP shunt  . BREAST REDUCTION SURGERY Bilateral 06/07/2013   Procedure: BILATERAL MAMMARY REDUCTION  (BREAST);  Surgeon: Theodoro Kos, DO;  Location: [facility];  Service: Plastics;  Laterality: Bilateral;  . BREAST REDUCTION SURGERY Bilateral 06/07/2013   Procedure:  LIPOSUCTION;  Surgeon: Theodoro Kos, DO;  Location: [facility];  Service: Plastics;  Laterality: Bilateral;  . CARPAL TUNNEL RELEASE Bilateral 2001  . COLONOSCOPY  X 2   "polyps were benign"  . DILATION AND CURETTAGE OF UTERUS    . ESOPHAGOGASTRODUODENOSCOPY    . gastric banding undone  11/2014  . LAPAROSCOPIC CHOLECYSTECTOMY  ~ 2000  . LAPAROSCOPIC GASTRIC BANDING  2011  . LAPAROSCOPIC REVISION  VENTRICULAR-PERITONEAL (V-P) SHUNT N/A 06/23/2017   Procedure: Shunt Placment stereotactic ventricular catheter placement and general surgery abdominal exposure  (Laparoscopic vs. Open) Brain Lab;  Surgeon: Clovis Riley, MD;  Location: [facility];  Service: General;  Laterality: N/A;  . LAPAROSCOPY N/A 06/23/2017   Procedure: LAPAROSCOPY DIAGNOSTIC;  Surgeon: Clovis Riley, MD;  Location: [facility];  Service: General;  Laterality: N/A;  . LUMBAR LAMINECTOMY/DECOMPRESSION MICRODISCECTOMY N/A 07/23/2016   Procedure: Decompressive Lumbar Laminectomy Lumbar two-three  for Repair of  Cerberal Spinal Fluid  Leak;  Surgeon: Kary Kos, MD;  Location: [facility];  Service: Neurosurgery;  Laterality: N/A;  . LUMBAR LAMINECTOMY/DECOMPRESSION MICRODISCECTOMY Left 03/01/2019   Procedure: LEFT LUMBAR THREE-FOUR EXTRAFORAMINAL MICRODISCECTOMY;  Surgeon: Kary Kos, MD;  Location: [facility];  Service: Neurosurgery;  Laterality: Left;  . PLACEMENT LUMBOPERITONEAL SHUNT FOR CEREBROSPINAL FLUID DIVERSION AWAY FROM C8 PERINEURAL CYST  09-05-2003  &  12-24-2003   08-31-2003  REMOVAL SHUNT  . POSTERIOR CERVICAL LAMINECTOMY  03-01-2001   C7-8 and Exploration C8 perineural cyst  . POSTERIOR LUMBAR LAMINECTOMY L4-5/  DISKECTOMY L5-S1  05-20-2005  . RE-DO DECOMPRESSION LAMINECTOMY AND FUSION L4-5  &  L5-S1  12-20-2006   02-04-2007--  RE-EXPLORATION L4 to S1, I & D LUMBAR WOUND HEMATOMA  . REDUCTION MAMMAPLASTY    . SHUNT REMOVAL N/A 07/10/2016   Procedure: SHUNT REMOVAL;  Surgeon: Kary Kos, MD;  Location: [facility];  Service: Neurosurgery;  Laterality: N/A;  SHUNT REMOVAL  . SPINAL CORD STIMULATOR INSERTION N/A 04/26/2015   Procedure: LUMBAR SPINAL CORD STIMULATOR INSERTION;  Surgeon: Clydell Hakim, MD;  Location: [facility];  Service: Neurosurgery;  Laterality: N/A;  LUMBAR SPINAL CORD STIMULATOR INSERTION  . SPINAL CORD STIMULATOR REMOVAL N/A 06/28/2019   Procedure: Removal of dorsal column stimulator and generator -;  Surgeon: Kary Kos, MD;  Location: [facility];  Service: Neurosurgery;  Laterality: N/A;  . TENNIS ELBOW RELEASE/NIRSCHEL PROCEDURE Left 12/13/2014   Procedure: LEFT ELBOW LATERAL EPICONDYLAR DEBRIDEMENT AND OR REPAIR -RECONSTRUCTION ;  Surgeon: Iran Planas, MD;  Location: [facility];  Service: Orthopedics;  Laterality: Left;  . TOTAL ABDOMINAL HYSTERECTOMY  1994   w/  Bilateral Salpinoophorectomy  . VENTRICULOPERITONEAL SHUNT N/A 06/23/2017   Procedure: VENTRICULAR-PERITONEAL SHUNT INSERTION WITH ABDOMINAL LAPARASCOPY AND BRAINLAB NAVIGATION;  Surgeon: Kary Kos, MD;  Location: [facility];  Service: Neurosurgery;  Laterality: N/A;    Family History  Problem Relation Age of Onset  . Depression Other   . Colon cancer Father   . Ovarian cancer Maternal Grandmother   . Pancreatic cancer Maternal Grandfather     Social History   Socioeconomic History  . Marital status: Legally Separated    Spouse name: [name]  . Number of children: 2  . Years of education: Assoc  .  Highest education level: Associate degree: academic program  Occupational History  . Occupation: Disabled   Tobacco Use  . Smoking status: Never Smoker  . Smokeless tobacco: Never Used  Substance and Sexual Activity  . Alcohol use: No  . Drug use: No  . Sexual activity: Not Currently    Birth control/protection: Surgical  Other Topics Concern  . Not on file  Social History Narrative   Caffeine: occasionally    Mrs Delano is married, on permanent disability and lives at home with her husband, her support system. She is independent in her care needs. She denies concern with transportation to medical appointments She is on a fixed income   Social Determinants of Health   Financial Resource Strain:   . Difficulty of Paying Living Expenses:   Food Insecurity:   . Worried About Charity fundraiser in the Last Year:   . Arboriculturist in the Last Year:   Transportation Needs: No Transportation Needs  . Lack of Transportation  (Medical): No  . Lack of Transportation (Non-Medical): No  Physical Activity:   . Days of Exercise per Week:   . Minutes of Exercise per Session:   Stress: No Stress Concern Present  . Feeling of Stress : Only a little  Social Connections:   . Frequency of Communication with Friends and Family:   . Frequency of Social Gatherings with Friends and Family:   . Attends Religious Services:   . Active Member of Clubs or Organizations:   . Attends Archivist Meetings:   Marland Kitchen Marital Status:   Intimate Partner Violence:   . Fear of Current or Ex-Partner:   . Emotionally Abused:   Marland Kitchen Physically Abused:   . Sexually Abused:      Current Outpatient Medications:  .  busPIRone (BUSPAR) 10 MG tablet, TAKE 1 TABLET BY MOUTH THREE TIMES A DAY (Patient taking differently: Take 10 mg by mouth 3 (three) times daily as needed (anxiety). ), Disp: 270 tablet, Rfl: 1 .  celecoxib (CELEBREX) 200 MG capsule, Take 200 mg by mouth daily., Disp: , Rfl:  .  diclofenac sodium (VOLTAREN) 1 % GEL, Apply 4 g topically 4 (four) times daily. To affected joint. (Patient taking differently: Apply 4 g topically as needed (pain). To affected joint.), Disp: 100 g, Rfl: 11 .  DULoxetine (CYMBALTA) 60 MG capsule, TAKE 2 CAPSULES BY MOUTH EVERY DAY, Disp: 60 capsule, Rfl: 0 .  furosemide (LASIX) 40 MG tablet, TAKE 1 TABLET BY MOUTH TWICE A DAY (Patient taking differently: Take 40 mg by mouth as needed. Hospital changed), Disp: 180 tablet, Rfl: 1 .  methadone (DOLOPHINE) 5 MG tablet, Take 2.5 mg by mouth daily. , Disp: , Rfl: 0 .  methocarbamol (ROBAXIN) 500 MG tablet, Take 500 mg by mouth every 6 (six) hours as needed for muscle spasms., Disp: , Rfl:  .  montelukast (SINGULAIR) 10 MG tablet, TAKE 1 TABLET BY MOUTH EVERYDAY AT BEDTIME (Patient taking differently: Take 10 mg by mouth at bedtime. ), Disp: 90 tablet, Rfl: 1 .  NARCAN 4 MG/0.1ML LIQD nasal spray kit, Place 0.4 mg into the nose once. , Disp: , Rfl:  .   oxyCODONE-acetaminophen (PERCOCET) 5-325 MG tablet, Take 1-2 tablets by mouth every 6 (six) hours as needed for severe pain., Disp: 20 tablet, Rfl: 0 .  OZEMPIC, 1 MG/DOSE, 2 MG/1.5ML SOPN, INJECT 1 MG INTO THE SKIN ONCE A WEEK., Disp: 1.5 mL, Rfl: 1 .  polyethylene glycol (MIRALAX /  GLYCOLAX) 17 g packet, Take 17 g by mouth daily as needed for mild constipation or moderate constipation., Disp: , Rfl:  .  pramipexole (MIRAPEX) 0.5 MG tablet, Take 1 tablet (0.5 mg total) by mouth at bedtime. (Patient taking differently: Take 0.5 mg by mouth at bedtime. Must be taken before by 18:30), Disp: 90 tablet, Rfl: 3 .  simvastatin (ZOCOR) 20 MG tablet, TAKE 1 TABLET BY MOUTH EVERY DAY (Patient taking differently: Take 20 mg by mouth at bedtime. ), Disp: 90 tablet, Rfl: 2 .  traMADol (ULTRAM) 50 MG tablet, Take 50-100 mg by mouth every 8 (eight) hours as needed (Pain). , Disp: , Rfl:   EXAM:  VITALS per patient if applicable: There were no vitals taken for this visit.  GENERAL: alert, oriented, appears well and in no acute distress  HEENT: atraumatic, conjunttiva clear, no obvious abnormalities on inspection of external nose and ears  NECK: normal movements of the head and neck  LUNGS: on inspection no signs of respiratory distress, breathing rate appears normal, no obvious gross SOB, gasping or wheezing  CV: no obvious cyanosis  MS: moves all visible extremities without noticeable abnormality  PSYCH/NEURO: pleasant and cooperative, no obvious depression or anxiety, speech and thought processing grossly intact  ASSESSMENT AND PLAN:  Discussed the following assessment and plan:  Bronchitis Recommend increased fluids and addition of mucinex.  Continue antihistamine.  I am adding 5 days of prednisone and will cover for bacterial component with azithromycin since she has had prolonged symptoms  She will let me know if having any worsening symptoms or if symptoms are not improving.   30 minutes  spent including pre visit preparation, review of prior notes and labs, encounter with patient via video visit and same day documentation.    I discussed the assessment and treatment plan with the patient. The patient was provided an opportunity to ask questions and all were answered. The patient agreed with the plan and demonstrated an understanding of the instructions.   The patient was advised to call back or seek an in-person evaluation if the symptoms worsen or if the condition fails to improve as anticipated.    Luetta Nutting, DO

## 2019-09-14 DIAGNOSIS — M5440 Lumbago with sciatica, unspecified side: Secondary | ICD-10-CM | POA: Diagnosis not present

## 2019-09-19 ENCOUNTER — Other Ambulatory Visit: Payer: Self-pay | Admitting: Sports Medicine

## 2019-09-19 NOTE — Telephone Encounter (Signed)
To PCP

## 2019-09-26 ENCOUNTER — Ambulatory Visit (INDEPENDENT_AMBULATORY_CARE_PROVIDER_SITE_OTHER): Payer: Medicare HMO | Admitting: Psychology

## 2019-09-26 DIAGNOSIS — F411 Generalized anxiety disorder: Secondary | ICD-10-CM

## 2019-09-26 DIAGNOSIS — F4323 Adjustment disorder with mixed anxiety and depressed mood: Secondary | ICD-10-CM

## 2019-09-26 DIAGNOSIS — R69 Illness, unspecified: Secondary | ICD-10-CM | POA: Diagnosis not present

## 2019-09-28 DIAGNOSIS — Z79899 Other long term (current) drug therapy: Secondary | ICD-10-CM | POA: Diagnosis not present

## 2019-09-28 DIAGNOSIS — M79605 Pain in left leg: Secondary | ICD-10-CM | POA: Diagnosis not present

## 2019-09-28 DIAGNOSIS — M542 Cervicalgia: Secondary | ICD-10-CM | POA: Diagnosis not present

## 2019-09-28 DIAGNOSIS — M7061 Trochanteric bursitis, right hip: Secondary | ICD-10-CM | POA: Diagnosis not present

## 2019-09-29 ENCOUNTER — Other Ambulatory Visit: Payer: Self-pay | Admitting: Family Medicine

## 2019-10-02 NOTE — Telephone Encounter (Signed)
Please see message and advise.  Thank you. ° °

## 2019-10-10 ENCOUNTER — Ambulatory Visit (INDEPENDENT_AMBULATORY_CARE_PROVIDER_SITE_OTHER): Payer: Medicare HMO | Admitting: Psychology

## 2019-10-10 DIAGNOSIS — F4323 Adjustment disorder with mixed anxiety and depressed mood: Secondary | ICD-10-CM | POA: Diagnosis not present

## 2019-10-10 DIAGNOSIS — R69 Illness, unspecified: Secondary | ICD-10-CM | POA: Diagnosis not present

## 2019-10-10 DIAGNOSIS — F411 Generalized anxiety disorder: Secondary | ICD-10-CM | POA: Diagnosis not present

## 2019-10-17 ENCOUNTER — Encounter: Payer: Self-pay | Admitting: Family Medicine

## 2019-10-19 ENCOUNTER — Encounter: Payer: Self-pay | Admitting: Family Medicine

## 2019-10-24 ENCOUNTER — Ambulatory Visit (INDEPENDENT_AMBULATORY_CARE_PROVIDER_SITE_OTHER): Payer: Medicare HMO | Admitting: Psychology

## 2019-10-24 DIAGNOSIS — F411 Generalized anxiety disorder: Secondary | ICD-10-CM

## 2019-10-24 DIAGNOSIS — F4323 Adjustment disorder with mixed anxiety and depressed mood: Secondary | ICD-10-CM | POA: Diagnosis not present

## 2019-10-24 DIAGNOSIS — R69 Illness, unspecified: Secondary | ICD-10-CM | POA: Diagnosis not present

## 2019-11-07 ENCOUNTER — Ambulatory Visit (INDEPENDENT_AMBULATORY_CARE_PROVIDER_SITE_OTHER): Payer: Medicare HMO | Admitting: Psychology

## 2019-11-07 DIAGNOSIS — F4323 Adjustment disorder with mixed anxiety and depressed mood: Secondary | ICD-10-CM | POA: Diagnosis not present

## 2019-11-07 DIAGNOSIS — R69 Illness, unspecified: Secondary | ICD-10-CM | POA: Diagnosis not present

## 2019-11-07 DIAGNOSIS — F411 Generalized anxiety disorder: Secondary | ICD-10-CM

## 2019-11-07 DIAGNOSIS — Z23 Encounter for immunization: Secondary | ICD-10-CM | POA: Diagnosis not present

## 2019-11-21 ENCOUNTER — Ambulatory Visit (INDEPENDENT_AMBULATORY_CARE_PROVIDER_SITE_OTHER): Payer: Medicare HMO | Admitting: Psychology

## 2019-11-21 DIAGNOSIS — F4323 Adjustment disorder with mixed anxiety and depressed mood: Secondary | ICD-10-CM

## 2019-11-21 DIAGNOSIS — F411 Generalized anxiety disorder: Secondary | ICD-10-CM

## 2019-11-21 DIAGNOSIS — R69 Illness, unspecified: Secondary | ICD-10-CM | POA: Diagnosis not present

## 2019-11-22 DIAGNOSIS — R1032 Left lower quadrant pain: Secondary | ICD-10-CM | POA: Diagnosis not present

## 2019-11-22 DIAGNOSIS — R109 Unspecified abdominal pain: Secondary | ICD-10-CM | POA: Diagnosis not present

## 2019-11-22 DIAGNOSIS — R310 Gross hematuria: Secondary | ICD-10-CM | POA: Diagnosis not present

## 2019-11-22 DIAGNOSIS — I252 Old myocardial infarction: Secondary | ICD-10-CM | POA: Diagnosis not present

## 2019-11-22 DIAGNOSIS — N200 Calculus of kidney: Secondary | ICD-10-CM | POA: Diagnosis not present

## 2019-11-23 DIAGNOSIS — Z79899 Other long term (current) drug therapy: Secondary | ICD-10-CM | POA: Diagnosis not present

## 2019-11-23 DIAGNOSIS — M79609 Pain in unspecified limb: Secondary | ICD-10-CM | POA: Diagnosis not present

## 2019-11-23 DIAGNOSIS — M5440 Lumbago with sciatica, unspecified side: Secondary | ICD-10-CM | POA: Diagnosis not present

## 2019-11-23 DIAGNOSIS — M7712 Lateral epicondylitis, left elbow: Secondary | ICD-10-CM | POA: Diagnosis not present

## 2019-11-24 ENCOUNTER — Ambulatory Visit (INDEPENDENT_AMBULATORY_CARE_PROVIDER_SITE_OTHER): Payer: Medicare HMO | Admitting: Family Medicine

## 2019-11-24 ENCOUNTER — Encounter: Payer: Self-pay | Admitting: Family Medicine

## 2019-11-24 ENCOUNTER — Other Ambulatory Visit: Payer: Self-pay

## 2019-11-24 VITALS — BP 123/88 | HR 93 | Temp 98.5°F | Ht 62.99 in | Wt 173.5 lb

## 2019-11-24 DIAGNOSIS — I2129 ST elevation (STEMI) myocardial infarction involving other sites: Secondary | ICD-10-CM

## 2019-11-24 DIAGNOSIS — R8290 Unspecified abnormal findings in urine: Secondary | ICD-10-CM | POA: Diagnosis not present

## 2019-11-24 DIAGNOSIS — R5383 Other fatigue: Secondary | ICD-10-CM

## 2019-11-24 DIAGNOSIS — Z79899 Other long term (current) drug therapy: Secondary | ICD-10-CM | POA: Diagnosis not present

## 2019-11-24 DIAGNOSIS — D649 Anemia, unspecified: Secondary | ICD-10-CM | POA: Diagnosis not present

## 2019-11-24 DIAGNOSIS — R319 Hematuria, unspecified: Secondary | ICD-10-CM

## 2019-11-24 LAB — POCT URINALYSIS DIP (CLINITEK)
Bilirubin, UA: NEGATIVE
Glucose, UA: NEGATIVE mg/dL
Ketones, POC UA: NEGATIVE mg/dL
Nitrite, UA: POSITIVE — AB
POC PROTEIN,UA: NEGATIVE
Spec Grav, UA: 1.025 (ref 1.010–1.025)
Urobilinogen, UA: 0.2 E.U./dL
pH, UA: 5.5 (ref 5.0–8.0)

## 2019-11-24 LAB — TSH: TSH: 0.73 mIU/L

## 2019-11-24 LAB — FERRITIN: Ferritin: 40 ng/mL (ref 16–232)

## 2019-11-24 LAB — VITAMIN B12: Vitamin B-12: 349 pg/mL (ref 200–1100)

## 2019-11-24 MED ORDER — NITROFURANTOIN MONOHYD MACRO 100 MG PO CAPS
100.0000 mg | ORAL_CAPSULE | Freq: Two times a day (BID) | ORAL | 0 refills | Status: AC
Start: 2019-11-24 — End: 2019-12-01

## 2019-11-24 NOTE — Patient Instructions (Addendum)
Have Echocardiogram results sent over to [place].  Have labs completed.  I have entered referral to cardiology.  If you experience chest pain or tightness go to the hospital.   I have sent over an antibiotic to cover for urinary tract infection.   We'll be in touch with lab results.

## 2019-11-26 DIAGNOSIS — R319 Hematuria, unspecified: Secondary | ICD-10-CM | POA: Insufficient documentation

## 2019-11-26 DIAGNOSIS — R5383 Other fatigue: Secondary | ICD-10-CM | POA: Insufficient documentation

## 2019-11-26 DIAGNOSIS — I2129 ST elevation (STEMI) myocardial infarction involving other sites: Secondary | ICD-10-CM | POA: Insufficient documentation

## 2019-11-26 LAB — URINE CULTURE
MICRO NUMBER:: 10686411
MICRO NUMBER:: 10686714
SPECIMEN QUALITY:: ADEQUATE
SPECIMEN QUALITY:: ADEQUATE

## 2019-11-26 LAB — COMPLETE METABOLIC PANEL WITH GFR
AG Ratio: 2 (calc) (ref 1.0–2.5)
ALT: 8 U/L (ref 6–29)
AST: 12 U/L (ref 10–35)
Albumin: 4.3 g/dL (ref 3.6–5.1)
Alkaline phosphatase (APISO): 102 U/L (ref 37–153)
BUN: 14 mg/dL (ref 7–25)
CO2: 32 mmol/L (ref 20–32)
Calcium: 9.5 mg/dL (ref 8.6–10.4)
Chloride: 104 mmol/L (ref 98–110)
Creat: 0.72 mg/dL (ref 0.50–1.05)
GFR, Est African American: 112 mL/min/{1.73_m2} (ref >=60)
GFR, Est Non African American: 96 mL/min/{1.73_m2} (ref >=60)
Globulin: 2.1 g/dL (calc) (ref 1.9–3.7)
Glucose, Bld: 91 mg/dL (ref 65–99)
Potassium: 4.9 mmol/L (ref 3.5–5.3)
Sodium: 142 mmol/L (ref 135–146)
Total Bilirubin: 0.6 mg/dL (ref 0.2–1.2)
Total Protein: 6.4 g/dL (ref 6.1–8.1)

## 2019-11-26 LAB — CBC
HCT: 43.3 % (ref 35.0–45.0)
Hemoglobin: 13.6 g/dL (ref 11.7–15.5)
MCH: 26.9 pg — ABNORMAL LOW (ref 27.0–33.0)
MCHC: 31.4 g/dL — ABNORMAL LOW (ref 32.0–36.0)
MCV: 85.7 fL (ref 80.0–100.0)
MPV: 10.6 fL (ref 7.5–12.5)
Platelets: 253 10*3/uL (ref 140–400)
RBC: 5.05 10*6/uL (ref 3.80–5.10)
RDW: 13.3 % (ref 11.0–15.0)
WBC: 8.4 10*3/uL (ref 3.8–10.8)

## 2019-11-26 LAB — LIPID PANEL W/REFLEX DIRECT LDL
Cholesterol: 187 mg/dL (ref <200)
HDL: 69 mg/dL (ref >=50)
LDL Cholesterol (Calc): 97 mg/dL (calc)
Non-HDL Cholesterol (Calc): 118 mg/dL (calc) (ref <130)
Total CHOL/HDL Ratio: 2.7 (calc) (ref <5.0)
Triglycerides: 110 mg/dL (ref <150)

## 2019-11-26 NOTE — Assessment & Plan Note (Signed)
UA consistent with cystitis.  Urine sent for culture.   Start macrobid bid x5 days.

## 2019-11-26 NOTE — Assessment & Plan Note (Signed)
Possible prior apical infarct noted on CT.  EKG without significant changes from previous tracing.  She has had some fatigue but denies chest pain or dyspnea. She has ECHO scheduled next week to assess LV function.  I have entered cardiology referral.

## 2019-11-26 NOTE — Progress Notes (Signed)
[name] - 54 y.o. female MRN [id number]  Date of birth: [birth year]  Subjective Chief Complaint  Patient presents with  . Follow-up    HPI Katrina Fernandez is a 54 y.o. female here today for follow up of urgent care visit.  She was seen at [facility] for gross hematuria.  She had UA which showed some blood but she isn't sure if urine was sent for culture and she did not have any further testing.   CT scan of the abdomen and pelvis was ordered which showed non-obstructive renal stones.  It also noted findings suggestive L ventricular apex infarct.  She reports that blood in urine has lessened. She is no longer passing large amounts.  She does have pressure over her bladder and has some urinary frequency/urgency.    In regards to apical infarct she had episode of severe fatigue a few weeks ago.  This has improved some.  She does not recall chest pain, shortness of breath, palpitations, or dizziness.   ROS:  A comprehensive ROS was completed and negative except as noted per HPI  Allergies  Allergen Reactions  . Penicillins Anaphylaxis    Has patient had a PCN reaction causing immediate rash, facial/tongue/throat swelling, SOB or lightheadedness with hypotension: Yes Has patient had a PCN reaction causing severe rash involving mucus membranes or skin necrosis: No Has patient had a PCN reaction that required hospitalization No Has patient had a PCN reaction occurring within the last 10 years: No If all of the above answers are "NO", then may proceed with Cephalosporin use.   . Lactose Nausea And Vomiting and Swelling       . Chlorhexidine Rash and Other (See Comments)    Dermatitis - Allergic  . Tape Rash    Plastic tape, silicone tape, Steri Strips    Past Medical History:  Diagnosis Date  . Anemia   . Anxiety   . Arthritis    "back and knees" (07/23/2016)  . Chiari malformation type I ([place]) dx'd 2014  . Chronic back pain    buldging, DDD; "neck and  lower back" (07/23/2016)  . Chronic constipation    takes Miralax daily  . Chronic pain syndrome    takes Methadone and Keppra daily  . Depression    takes Effexor daily  . Endometriosis    1995  . Fibromyalgia   . GERD (gastroesophageal reflux disease)    not taking anything at the present time  . History of DVT of lower extremity 07/2012   LEFT LEG --  3/ 2014.Was on a blood transfusion for 6 months  . History of kidney stones   . History of MRSA infection 10 yrs ago  . Hyperlipidemia    takes Simvastatin daily  . Migraine    "daily recently" (07/23/2016)  . Muscle spasm    takes Zanaflex nightly  . Peripheral edema    takes Lasix daily as needed  . Peripheral neuropathy   . Pleurisy 2000  . Pneumonia    hx of-7-8 yrs ago  . PONV (postoperative nausea and vomiting)   . Seasonal allergies    takes Claritin daily  . Spinal headache    "since 2002; still get them; try to manage it w/lying flat"  . Weakness    and numbness in right hand    Past Surgical History:  Procedure Laterality Date  . ANTERIOR CERVICAL DECOMP/DISCECTOMY FUSION  07-25-2009   C5 -- C6  . APPENDECTOMY  age 27  .  APPLICATION OF CRANIAL NAVIGATION N/A 06/23/2017   Procedure: APPLICATION OF CRANIAL NAVIGATION;  Surgeon: Kary Kos, MD;  Location: [place];  Service: Neurosurgery;  Laterality: N/A;  . BACK SURGERY    . BRAIN SURGERY     VP shunt  . BREAST REDUCTION SURGERY Bilateral 06/07/2013   Procedure: BILATERAL MAMMARY REDUCTION  (BREAST);  Surgeon: Theodoro Kos, DO;  Location: [place];  Service: Plastics;  Laterality: Bilateral;  . BREAST REDUCTION SURGERY Bilateral 06/07/2013   Procedure:  LIPOSUCTION;  Surgeon: Theodoro Kos, DO;  Location: [place];  Service: Plastics;  Laterality: Bilateral;  . CARPAL TUNNEL RELEASE Bilateral 2001  . COLONOSCOPY  X 2   "polyps were benign"  . DILATION AND CURETTAGE OF UTERUS    . ESOPHAGOGASTRODUODENOSCOPY    . gastric banding  undone  11/2014  . LAPAROSCOPIC CHOLECYSTECTOMY  ~ 2000  . LAPAROSCOPIC GASTRIC BANDING  2011  . LAPAROSCOPIC REVISION VENTRICULAR-PERITONEAL (V-P) SHUNT N/A 06/23/2017   Procedure: Shunt Placment stereotactic ventricular catheter placement and general surgery abdominal exposure  (Laparoscopic vs. Open) Brain Lab;  Surgeon: Clovis Riley, MD;  Location: [place];  Service: General;  Laterality: N/A;  . LAPAROSCOPY N/A 06/23/2017   Procedure: LAPAROSCOPY DIAGNOSTIC;  Surgeon: Clovis Riley, MD;  Location: [place];  Service: General;  Laterality: N/A;  . LUMBAR LAMINECTOMY/DECOMPRESSION MICRODISCECTOMY N/A 07/23/2016   Procedure: Decompressive Lumbar Laminectomy Lumbar two-three  for Repair of  Cerberal Spinal Fluid  Leak;  Surgeon: Kary Kos, MD;  Location: [place];  Service: Neurosurgery;  Laterality: N/A;  . LUMBAR LAMINECTOMY/DECOMPRESSION MICRODISCECTOMY Left 03/01/2019   Procedure: LEFT LUMBAR THREE-FOUR EXTRAFORAMINAL MICRODISCECTOMY;  Surgeon: Kary Kos, MD;  Location: [place];  Service: Neurosurgery;  Laterality: Left;  . PLACEMENT LUMBOPERITONEAL SHUNT FOR CEREBROSPINAL FLUID DIVERSION AWAY FROM C8 PERINEURAL CYST  09-05-2003  &  12-24-2003   08-31-2003  REMOVAL SHUNT  . POSTERIOR CERVICAL LAMINECTOMY  03-01-2001   C7-8 and Exploration C8 perineural cyst  . POSTERIOR LUMBAR LAMINECTOMY L4-5/  DISKECTOMY L5-S1  05-20-2005  . RE-DO DECOMPRESSION LAMINECTOMY AND FUSION L4-5  &  L5-S1  12-20-2006   02-04-2007--  RE-EXPLORATION L4 to S1, I & D LUMBAR WOUND HEMATOMA  . REDUCTION MAMMAPLASTY    . SHUNT REMOVAL N/A 07/10/2016   Procedure: SHUNT REMOVAL;  Surgeon: Kary Kos, MD;  Location: [place];  Service: Neurosurgery;  Laterality: N/A;  SHUNT REMOVAL  . SPINAL CORD STIMULATOR INSERTION N/A 04/26/2015   Procedure: LUMBAR SPINAL CORD STIMULATOR INSERTION;  Surgeon: Clydell Hakim, MD;  Location: [place] [facility];  Service: Neurosurgery;  Laterality: N/A;  LUMBAR SPINAL CORD STIMULATOR INSERTION  . SPINAL CORD  STIMULATOR REMOVAL N/A 06/28/2019   Procedure: Removal of dorsal column stimulator and generator -;  Surgeon: Kary Kos, MD;  Location: [place];  Service: Neurosurgery;  Laterality: N/A;  . TENNIS ELBOW RELEASE/NIRSCHEL PROCEDURE Left 12/13/2014   Procedure: LEFT ELBOW LATERAL EPICONDYLAR DEBRIDEMENT AND OR REPAIR -RECONSTRUCTION ;  Surgeon: Iran Planas, MD;  Location: [place];  Service: Orthopedics;  Laterality: Left;  . TOTAL ABDOMINAL HYSTERECTOMY  1994   w/  Bilateral Salpinoophorectomy  . VENTRICULOPERITONEAL SHUNT N/A 06/23/2017   Procedure: VENTRICULAR-PERITONEAL SHUNT INSERTION WITH ABDOMINAL LAPARASCOPY AND BRAINLAB NAVIGATION;  Surgeon: Kary Kos, MD;  Location: [place];  Service: Neurosurgery;  Laterality: N/A;    Social History   Socioeconomic History  . Marital status: Legally Separated    Spouse name: [name]  . Number of children: 2  .  Years of education: Assoc  . Highest education level: Associate degree: academic program  Occupational History  . Occupation: Disabled   Tobacco Use  . Smoking status: Never Smoker  . Smokeless tobacco: Never Used  Vaping Use  . Vaping Use: Never used  Substance and Sexual Activity  . Alcohol use: No  . Drug use: No  . Sexual activity: Not Currently    Birth control/protection: Surgical  Other Topics Concern  . Not on file  Social History Narrative   Caffeine: occasionally    Mrs Bains is married, on permanent disability and lives at home with her husband, her support system. She is independent in her care needs. She denies concern with transportation to medical appointments She is on a fixed income   Social Determinants of Health   Financial Resource Strain:   . Difficulty of Paying Living Expenses:   Food Insecurity:   . Worried About Charity fundraiser in the Last Year:   . Arboriculturist in the Last Year:   Transportation Needs:   . Film/video editor (Medical):   Marland Kitchen Lack of Transportation  (Non-Medical):   Physical Activity:   . Days of Exercise per Week:   . Minutes of Exercise per Session:   Stress:   . Feeling of Stress :   Social Connections:   . Frequency of Communication with Friends and Family:   . Frequency of Social Gatherings with Friends and Family:   . Attends Religious Services:   . Active Member of Clubs or Organizations:   . Attends Archivist Meetings:   Marland Kitchen Marital Status:     Family History  Problem Relation Age of Onset  . Depression Other   . Colon cancer Father   . Ovarian cancer Maternal Grandmother   . Pancreatic cancer Maternal Grandfather     Health Maintenance  Topic Date Due  . Hepatitis C Screening  Never done  . COVID-19 Vaccine (1) Never done  . COLONOSCOPY  Never done  . TETANUS/TDAP  07/18/2020 (Originally 05/19/2019)  . INFLUENZA VACCINE  12/17/2019  . MAMMOGRAM  07/07/2020  . HIV Screening  Completed     ----------------------------------------------------------------------------------------------------------------------------------------------------------------------------------------------------------------- Physical Exam BP 123/88 (BP Location: Left Arm, Patient Position: Sitting, Cuff Size: Normal)   Pulse 93   Temp 98.5 F (36.9 C) (Oral)   Ht 5' 2.99" (1.6 m)   Wt 173 lb 8 oz (78.7 kg)   SpO2 97%   BMI 30.74 kg/m   Physical Exam Constitutional:      Appearance: Normal appearance.  HENT:     Head: Normocephalic and atraumatic.  Eyes:     General: No scleral icterus. Cardiovascular:     Rate and Rhythm: Normal rate and regular rhythm.  Pulmonary:     Effort: Pulmonary effort is normal.     Breath sounds: Normal breath sounds.  Abdominal:     General: There is no distension.     Palpations: Abdomen is soft.     Tenderness: There is abdominal tenderness (suprapubic).  Musculoskeletal:     Cervical back: Neck supple.     Right lower leg: No edema.     Left lower leg: No edema.  Skin:     General: Skin is warm and dry.  Neurological:     General: No focal deficit present.     Mental Status: She is alert.  Psychiatric:        Mood and Affect: Mood normal.  Behavior: Behavior normal.    EKG: NSR.  No st or t wave changes.  No q waves noted.  Artifact in some leads so somewhat difficult to fully interpret.  ------------------------------------------------------------------------------------------------------------------------------------------------------------------------------------------------------------------- Assessment and Plan  Apical myocardial infarction [facility]) Possible prior apical infarct noted on CT.  EKG without significant changes from previous tracing.  She has had some fatigue but denies chest pain or dyspnea. She has ECHO scheduled next week to assess LV function.  I have entered cardiology referral.    Other fatigue Additional labs ordered for evaluation of fatigue today.    Hematuria UA consistent with cystitis.  Urine sent for culture.   Start macrobid bid x5 days.    Meds ordered this encounter  Medications  . nitrofurantoin, macrocrystal-monohydrate, (MACROBID) 100 MG capsule    Sig: Take 1 capsule (100 mg total) by mouth 2 (two) times daily for 7 days.    Dispense:  14 capsule    Refill:  0    No follow-ups on file.    This visit occurred during the SARS-CoV-2 public health emergency.  Safety protocols were in place, including screening questions prior to the visit, additional usage of staff PPE, and extensive cleaning of exam room while observing appropriate contact time as indicated for disinfecting solutions.

## 2019-11-26 NOTE — Assessment & Plan Note (Signed)
Additional labs ordered for evaluation of fatigue today.

## 2019-11-28 DIAGNOSIS — I209 Angina pectoris, unspecified: Secondary | ICD-10-CM | POA: Diagnosis not present

## 2019-11-30 ENCOUNTER — Encounter: Payer: Self-pay | Admitting: Family Medicine

## 2019-12-05 ENCOUNTER — Ambulatory Visit (INDEPENDENT_AMBULATORY_CARE_PROVIDER_SITE_OTHER): Payer: Medicare HMO | Admitting: Psychology

## 2019-12-05 DIAGNOSIS — R69 Illness, unspecified: Secondary | ICD-10-CM | POA: Diagnosis not present

## 2019-12-05 DIAGNOSIS — F411 Generalized anxiety disorder: Secondary | ICD-10-CM

## 2019-12-05 DIAGNOSIS — F4323 Adjustment disorder with mixed anxiety and depressed mood: Secondary | ICD-10-CM

## 2019-12-06 ENCOUNTER — Other Ambulatory Visit: Payer: Self-pay | Admitting: Family Medicine

## 2019-12-08 DIAGNOSIS — R079 Chest pain, unspecified: Secondary | ICD-10-CM | POA: Diagnosis not present

## 2019-12-09 ENCOUNTER — Other Ambulatory Visit: Payer: Self-pay | Admitting: Sports Medicine

## 2019-12-10 NOTE — Telephone Encounter (Signed)
To PCP

## 2019-12-14 DIAGNOSIS — M5440 Lumbago with sciatica, unspecified side: Secondary | ICD-10-CM | POA: Diagnosis not present

## 2019-12-14 DIAGNOSIS — M7061 Trochanteric bursitis, right hip: Secondary | ICD-10-CM | POA: Diagnosis not present

## 2019-12-19 ENCOUNTER — Ambulatory Visit (INDEPENDENT_AMBULATORY_CARE_PROVIDER_SITE_OTHER): Payer: Medicare HMO | Admitting: Psychology

## 2019-12-19 DIAGNOSIS — F4323 Adjustment disorder with mixed anxiety and depressed mood: Secondary | ICD-10-CM

## 2019-12-19 DIAGNOSIS — F411 Generalized anxiety disorder: Secondary | ICD-10-CM

## 2019-12-19 DIAGNOSIS — R69 Illness, unspecified: Secondary | ICD-10-CM | POA: Diagnosis not present

## 2020-01-02 ENCOUNTER — Ambulatory Visit (INDEPENDENT_AMBULATORY_CARE_PROVIDER_SITE_OTHER): Payer: Medicare HMO | Admitting: Psychology

## 2020-01-02 DIAGNOSIS — F4323 Adjustment disorder with mixed anxiety and depressed mood: Secondary | ICD-10-CM | POA: Diagnosis not present

## 2020-01-02 DIAGNOSIS — F411 Generalized anxiety disorder: Secondary | ICD-10-CM | POA: Diagnosis not present

## 2020-01-02 DIAGNOSIS — R69 Illness, unspecified: Secondary | ICD-10-CM | POA: Diagnosis not present

## 2020-01-16 ENCOUNTER — Ambulatory Visit (INDEPENDENT_AMBULATORY_CARE_PROVIDER_SITE_OTHER): Payer: Medicare HMO | Admitting: Psychology

## 2020-01-16 DIAGNOSIS — F4323 Adjustment disorder with mixed anxiety and depressed mood: Secondary | ICD-10-CM

## 2020-01-16 DIAGNOSIS — R69 Illness, unspecified: Secondary | ICD-10-CM | POA: Diagnosis not present

## 2020-01-16 DIAGNOSIS — F411 Generalized anxiety disorder: Secondary | ICD-10-CM | POA: Diagnosis not present

## 2020-01-17 DIAGNOSIS — Z981 Arthrodesis status: Secondary | ICD-10-CM | POA: Diagnosis not present

## 2020-01-17 DIAGNOSIS — R300 Dysuria: Secondary | ICD-10-CM | POA: Diagnosis not present

## 2020-01-25 DIAGNOSIS — M79605 Pain in left leg: Secondary | ICD-10-CM | POA: Diagnosis not present

## 2020-01-25 DIAGNOSIS — Z79899 Other long term (current) drug therapy: Secondary | ICD-10-CM | POA: Diagnosis not present

## 2020-01-25 DIAGNOSIS — G894 Chronic pain syndrome: Secondary | ICD-10-CM | POA: Diagnosis not present

## 2020-01-25 DIAGNOSIS — M7712 Lateral epicondylitis, left elbow: Secondary | ICD-10-CM | POA: Diagnosis not present

## 2020-01-25 DIAGNOSIS — Z5181 Encounter for therapeutic drug level monitoring: Secondary | ICD-10-CM | POA: Diagnosis not present

## 2020-01-30 ENCOUNTER — Ambulatory Visit: Payer: Medicare HMO | Admitting: Psychology

## 2020-02-05 NOTE — Progress Notes (Signed)
Referring-Katrina Zigmund Daniel, DO Reason for referral-coronary artery disease  HPI: 54 year old female for evaluation of coronary artery disease at request of Luetta Nutting, DO.  Patient had a recent CT of the abdomen due to abdominal pain that suggested prior left ventricular apical infarct.  Cardiology now asked to evaluate.  Patient states that she had Covid vaccination in May and afterwards developed dizziness and diaphoresis.  She had a CT of her abdomen in July as outlined above.  She denies dyspnea on exertion, orthopnea, PND, pedal edema or syncope.  She has occasional pain in her left upper chest that does not radiate.  It is not exertional.  Can last 2 to 3 hours at a time and resolve spontaneously.  No associated symptoms.  She was seen in [facility] and apparently had an echocardiogram that was normal.  She however wanted a second opinion.  Current Outpatient Medications  Medication Sig Dispense Refill  . celecoxib (CELEBREX) 200 MG capsule Take 200 mg by mouth daily.    . diclofenac sodium (VOLTAREN) 1 % GEL Apply 4 g topically 4 (four) times daily. To affected joint. (Patient taking differently: Apply 4 g topically as needed (pain). To affected joint.) 100 g 11  . DULoxetine (CYMBALTA) 60 MG capsule TAKE 2 CAPSULES BY MOUTH EVERY DAY 180 capsule 1  . methadone (DOLOPHINE) 5 MG tablet Take 2.5 mg by mouth daily.   0  . methocarbamol (ROBAXIN) 500 MG tablet Take 500 mg by mouth every 6 (six) hours as needed for muscle spasms.    . montelukast (SINGULAIR) 10 MG tablet TAKE 1 TABLET BY MOUTH EVERYDAY AT BEDTIME 90 tablet 1  . NARCAN 4 MG/0.1ML LIQD nasal spray kit Place 0.4 mg into the nose once.     Marland Kitchen OZEMPIC, 1 MG/DOSE, 2 MG/1.5ML SOPN INJECT 1 MG INTO THE SKIN ONCE A WEEK. 12 pen 1  . polyethylene glycol (MIRALAX / GLYCOLAX) 17 g packet Take 17 g by mouth daily as needed for mild constipation or moderate constipation.    . pramipexole (MIRAPEX) 0.5 MG tablet TAKE 1 TABLET (0.5 MG TOTAL)  BY MOUTH AT BEDTIME. 90 tablet 0  . simvastatin (ZOCOR) 20 MG tablet TAKE 1 TABLET BY MOUTH EVERY DAY (Patient taking differently: Take 20 mg by mouth at bedtime. ) 90 tablet 2  . traMADol (ULTRAM) 50 MG tablet Take 50-100 mg by mouth every 8 (eight) hours as needed (Pain).      No current facility-administered medications for this visit.    Allergies  Allergen Reactions  . Penicillins Anaphylaxis    Has patient had a PCN reaction causing immediate rash, facial/tongue/throat swelling, SOB or lightheadedness with hypotension: Yes Has patient had a PCN reaction causing severe rash involving mucus membranes or skin necrosis: No Has patient had a PCN reaction that required hospitalization No Has patient had a PCN reaction occurring within the last 10 years: No If all of the above answers are "NO", then may proceed with Cephalosporin use.   . Lactose Nausea And Vomiting and Swelling       . Chlorhexidine Rash and Other (See Comments)    Dermatitis - Allergic  . Tape Rash    Plastic tape, silicone tape, Steri Strips     Past Medical History:  Diagnosis Date  . Anemia   . Anxiety   . Arthritis    "back and knees" (07/23/2016)  . Chiari malformation type I ([place]) dx'd 2014  . Chronic back pain    buldging,  DDD; "neck and lower back" (07/23/2016)  . Chronic constipation    takes Miralax daily  . Chronic pain syndrome    takes Methadone and Keppra daily  . Depression    takes Effexor daily  . Endometriosis    1995  . Fibromyalgia   . GERD (gastroesophageal reflux disease)    not taking anything at the present time  . History of DVT of lower extremity 07/2012   LEFT LEG --  3/ 2014.Was on a blood transfusion for 6 months  . History of kidney stones   . History of MRSA infection 10 yrs ago  . Hyperlipidemia    takes Simvastatin daily  . Migraine    "daily recently" (07/23/2016)  . Muscle spasm    takes Zanaflex nightly  . Peripheral edema    takes Lasix daily as needed  .  Peripheral neuropathy   . Pleurisy 2000  . Pneumonia    hx of-7-8 yrs ago  . PONV (postoperative nausea and vomiting)   . Seasonal allergies    takes Claritin daily  . Spinal headache    "since 2002; still get them; try to manage it w/lying flat"    Past Surgical History:  Procedure Laterality Date  . ANTERIOR CERVICAL DECOMP/DISCECTOMY FUSION  07-25-2009   C5 -- C6  . APPENDECTOMY  age 18  . APPLICATION OF CRANIAL NAVIGATION N/A 06/23/2017   Procedure: APPLICATION OF CRANIAL NAVIGATION;  Surgeon: Kary Kos, MD;  Location: [place];  Service: Neurosurgery;  Laterality: N/A;  . BACK SURGERY    . BRAIN SURGERY     VP shunt  . BREAST REDUCTION SURGERY Bilateral 06/07/2013   Procedure: BILATERAL MAMMARY REDUCTION  (BREAST);  Surgeon: Theodoro Kos, DO;  Location: [place];  Service: Plastics;  Laterality: Bilateral;  . BREAST REDUCTION SURGERY Bilateral 06/07/2013   Procedure:  LIPOSUCTION;  Surgeon: Theodoro Kos, DO;  Location: [place];  Service: Plastics;  Laterality: Bilateral;  . CARPAL TUNNEL RELEASE Bilateral 2001  . COLONOSCOPY  X 2   "polyps were benign"  . DILATION AND CURETTAGE OF UTERUS    . ESOPHAGOGASTRODUODENOSCOPY    . gastric banding undone  11/2014  . LAPAROSCOPIC CHOLECYSTECTOMY  ~ 2000  . LAPAROSCOPIC GASTRIC BANDING  2011  . LAPAROSCOPIC REVISION VENTRICULAR-PERITONEAL (V-P) SHUNT N/A 06/23/2017   Procedure: Shunt Placment stereotactic ventricular catheter placement and general surgery abdominal exposure  (Laparoscopic vs. Open) Brain Lab;  Surgeon: Clovis Riley, MD;  Location: [place];  Service: General;  Laterality: N/A;  . LAPAROSCOPY N/A 06/23/2017   Procedure: LAPAROSCOPY DIAGNOSTIC;  Surgeon: Clovis Riley, MD;  Location: [place];  Service: General;  Laterality: N/A;  . LUMBAR LAMINECTOMY/DECOMPRESSION MICRODISCECTOMY N/A 07/23/2016   Procedure: Decompressive Lumbar Laminectomy Lumbar two-three  for Repair of  Cerberal Spinal  Fluid  Leak;  Surgeon: Kary Kos, MD;  Location: [place];  Service: Neurosurgery;  Laterality: N/A;  . LUMBAR LAMINECTOMY/DECOMPRESSION MICRODISCECTOMY Left 03/01/2019   Procedure: LEFT LUMBAR THREE-FOUR EXTRAFORAMINAL MICRODISCECTOMY;  Surgeon: Kary Kos, MD;  Location: [place];  Service: Neurosurgery;  Laterality: Left;  . PLACEMENT LUMBOPERITONEAL SHUNT FOR CEREBROSPINAL FLUID DIVERSION AWAY FROM C8 PERINEURAL CYST  09-05-2003  &  12-24-2003   08-31-2003  REMOVAL SHUNT  . POSTERIOR CERVICAL LAMINECTOMY  03-01-2001   C7-8 and Exploration C8 perineural cyst  . POSTERIOR LUMBAR LAMINECTOMY L4-5/  DISKECTOMY L5-S1  05-20-2005  . RE-DO DECOMPRESSION LAMINECTOMY AND FUSION L4-5  &  L5-S1  12-20-2006  02-04-2007--  RE-EXPLORATION L4 to S1, I & D LUMBAR WOUND HEMATOMA  . REDUCTION MAMMAPLASTY    . SHUNT REMOVAL N/A 07/10/2016   Procedure: SHUNT REMOVAL;  Surgeon: Kary Kos, MD;  Location: [place];  Service: Neurosurgery;  Laterality: N/A;  SHUNT REMOVAL  . SPINAL CORD STIMULATOR INSERTION N/A 04/26/2015   Procedure: LUMBAR SPINAL CORD STIMULATOR INSERTION;  Surgeon: Clydell Hakim, MD;  Location: [place] [facility];  Service: Neurosurgery;  Laterality: N/A;  LUMBAR SPINAL CORD STIMULATOR INSERTION  . SPINAL CORD STIMULATOR REMOVAL N/A 06/28/2019   Procedure: Removal of dorsal column stimulator and generator -;  Surgeon: Kary Kos, MD;  Location: [place];  Service: Neurosurgery;  Laterality: N/A;  . TENNIS ELBOW RELEASE/NIRSCHEL PROCEDURE Left 12/13/2014   Procedure: LEFT ELBOW LATERAL EPICONDYLAR DEBRIDEMENT AND OR REPAIR -RECONSTRUCTION ;  Surgeon: Iran Planas, MD;  Location: [place];  Service: Orthopedics;  Laterality: Left;  . TOTAL ABDOMINAL HYSTERECTOMY  1994   w/  Bilateral Salpinoophorectomy  . VENTRICULOPERITONEAL SHUNT N/A 06/23/2017   Procedure: VENTRICULAR-PERITONEAL SHUNT INSERTION WITH ABDOMINAL LAPARASCOPY AND BRAINLAB NAVIGATION;  Surgeon: Kary Kos, MD;  Location: [place];  Service:  Neurosurgery;  Laterality: N/A;    Social History   Socioeconomic History  . Marital status: Legally Separated    Spouse name: [name]  . Number of children: 2  . Years of education: Assoc  . Highest education level: Associate degree: academic program  Occupational History  . Occupation: Disabled   Tobacco Use  . Smoking status: Never Smoker  . Smokeless tobacco: Never Used  Vaping Use  . Vaping Use: Never used  Substance and Sexual Activity  . Alcohol use: No  . Drug use: No  . Sexual activity: Not Currently    Birth control/protection: Surgical  Other Topics Concern  . Not on file  Social History Narrative   Caffeine: occasionally    Mrs Skorupski is married, on permanent disability and lives at home with her husband, her support system. She is independent in her care needs. She denies concern with transportation to medical appointments She is on a fixed income   Social Determinants of Health   Financial Resource Strain:   . Difficulty of Paying Living Expenses: Not on file  Food Insecurity:   . Worried About Charity fundraiser in the Last Year: Not on file  . Ran Out of Food in the Last Year: Not on file  Transportation Needs:   . Lack of Transportation (Medical): Not on file  . Lack of Transportation (Non-Medical): Not on file  Physical Activity:   . Days of Exercise per Week: Not on file  . Minutes of Exercise per Session: Not on file  Stress:   . Feeling of Stress : Not on file  Social Connections:   . Frequency of Communication with Friends and Family: Not on file  . Frequency of Social Gatherings with Friends and Family: Not on file  . Attends Religious Services: Not on file  . Active Member of Clubs or Organizations: Not on file  . Attends Archivist Meetings: Not on file  . Marital Status: Not on file  Intimate Partner Violence:   . Fear of Current or Ex-Partner: Not on file  . Emotionally Abused: Not on file  . Physically Abused: Not on file  .  Sexually Abused: Not on file    Family History  Problem Relation Age of Onset  . Depression Other   . Colon cancer Father   .  Ovarian cancer Maternal Grandmother   . Pancreatic cancer Maternal Grandfather     ROS: Back pain but no fevers or chills, productive cough, hemoptysis, dysphasia, odynophagia, melena, hematochezia, dysuria, hematuria, rash, seizure activity, orthopnea, PND, pedal edema, claudication. Remaining systems are negative.  Physical Exam:   Blood pressure (!) 147/95, pulse 85, height 5' 2.99" (1.6 m), weight 176 lb 1.9 oz (79.9 kg).  General:  Well developed/well nourished in NAD Skin warm/dry Patient not depressed No peripheral clubbing Back-normal HEENT-normal/normal eyelids Neck supple/normal carotid upstroke bilaterally; no bruits; no JVD; no thyromegaly chest - CTA/ normal expansion CV - RRR/normal S1 and S2; no murmurs, rubs or gallops;  PMI nondisplaced Abdomen -NT/ND, no HSM, no mass, + bowel sounds, no bruit 2+ femoral pulses, no bruits Ext-no edema, chords, 2+ DP Neuro-grossly nonfocal  ECG -normal sinus rhythm at a rate of 79, no ST changes.  Personally reviewed  A/P  1 question apical infarct on recent abdominal CT-by history she had an echocardiogram that showed normal LV function with no wall motion abnormalities.  We will obtain the images and I will review.  Her electrocardiogram does not show evidence of prior infarct.  If wall motion is normal on echocardiogram I would think it unlikely that she would have had a prior infarct.  2 chest pain-symptoms are atypical.  However given question apical infarct on abdominal CT we will arrange CTA to exclude significant coronary disease.  3 hyperlipidemia-continue statin.  Kirk Ruths, MD

## 2020-02-08 DIAGNOSIS — N200 Calculus of kidney: Secondary | ICD-10-CM | POA: Insufficient documentation

## 2020-02-12 DIAGNOSIS — N200 Calculus of kidney: Secondary | ICD-10-CM | POA: Diagnosis not present

## 2020-02-12 DIAGNOSIS — B9629 Other Escherichia coli [E. coli] as the cause of diseases classified elsewhere: Secondary | ICD-10-CM | POA: Diagnosis not present

## 2020-02-12 DIAGNOSIS — N390 Urinary tract infection, site not specified: Secondary | ICD-10-CM | POA: Diagnosis not present

## 2020-02-13 ENCOUNTER — Ambulatory Visit (INDEPENDENT_AMBULATORY_CARE_PROVIDER_SITE_OTHER): Payer: Medicare HMO | Admitting: Family Medicine

## 2020-02-13 ENCOUNTER — Encounter: Payer: Self-pay | Admitting: Family Medicine

## 2020-02-13 ENCOUNTER — Other Ambulatory Visit: Payer: Self-pay

## 2020-02-13 ENCOUNTER — Ambulatory Visit (INDEPENDENT_AMBULATORY_CARE_PROVIDER_SITE_OTHER): Payer: Medicare HMO | Admitting: Psychology

## 2020-02-13 VITALS — BP 133/88 | HR 106 | Temp 97.7°F | Ht 62.99 in | Wt 176.8 lb

## 2020-02-13 DIAGNOSIS — R8290 Unspecified abnormal findings in urine: Secondary | ICD-10-CM | POA: Insufficient documentation

## 2020-02-13 DIAGNOSIS — R69 Illness, unspecified: Secondary | ICD-10-CM | POA: Diagnosis not present

## 2020-02-13 DIAGNOSIS — F4323 Adjustment disorder with mixed anxiety and depressed mood: Secondary | ICD-10-CM | POA: Diagnosis not present

## 2020-02-13 DIAGNOSIS — F411 Generalized anxiety disorder: Secondary | ICD-10-CM

## 2020-02-13 DIAGNOSIS — R030 Elevated blood-pressure reading, without diagnosis of hypertension: Secondary | ICD-10-CM

## 2020-02-13 NOTE — Progress Notes (Signed)
Katrina Fernandez - 54 y.o. female MRN [id number]  Date of birth: 08-31-1965  Subjective Chief Complaint  Patient presents with  . Hypertension    HPI Katrina Fernandez is a 54 y.o. female here today with complaint of elevated BP.  She was seen recently at pain management and urologists office and BP elevated.  Her last BP with our clinic was normal and BP today doesn't look too bad.  She has had some headaches but isn't sure if this may be related to VP shunt issues.  She has not had fever, chills, chest pain, shortness of breath ,palpitations, or vision changes.  She does have appt with neurosurgeon later this week.   She also had UA for follow up of kidney stones yesterday.  This was abnormal with blood, LE and nitrites.  It does not appear that a culture was sent.  She has had some urinary frequency.    ROS:  A comprehensive ROS was completed and negative except as noted per HPI  Allergies  Allergen Reactions  . Penicillins Anaphylaxis    Has patient had a PCN reaction causing immediate rash, facial/tongue/throat swelling, SOB or lightheadedness with hypotension: Yes Has patient had a PCN reaction causing severe rash involving mucus membranes or skin necrosis: No Has patient had a PCN reaction that required hospitalization No Has patient had a PCN reaction occurring within the last 10 years: No If all of the above answers are "NO", then may proceed with Cephalosporin use.   . Lactose Nausea And Vomiting and Swelling       . Chlorhexidine Rash and Other (See Comments)    Dermatitis - Allergic  . Tape Rash    Plastic tape, silicone tape, Steri Strips    Past Medical History:  Diagnosis Date  . Anemia   . Anxiety   . Arthritis    "back and knees" (07/23/2016)  . Chiari malformation type I ([place]) dx'd 2014  . Chronic back pain    buldging, DDD; "neck and lower back" (07/23/2016)  . Chronic constipation    takes Miralax daily  . Chronic pain syndrome    takes Methadone and  Keppra daily  . Depression    takes Effexor daily  . Endometriosis    1995  . Fibromyalgia   . GERD (gastroesophageal reflux disease)    not taking anything at the present time  . History of DVT of lower extremity 07/2012   LEFT LEG --  3/ 2014.Was on a blood transfusion for 6 months  . History of kidney stones   . History of MRSA infection 10 yrs ago  . Hyperlipidemia    takes Simvastatin daily  . Migraine    "daily recently" (07/23/2016)  . Muscle spasm    takes Zanaflex nightly  . Peripheral edema    takes Lasix daily as needed  . Peripheral neuropathy   . Pleurisy 2000  . Pneumonia    hx of-7-8 yrs ago  . PONV (postoperative nausea and vomiting)   . Seasonal allergies    takes Claritin daily  . Spinal headache    "since 2002; still get them; try to manage it w/lying flat"  . Weakness    and numbness in right hand    Past Surgical History:  Procedure Laterality Date  . ANTERIOR CERVICAL DECOMP/DISCECTOMY FUSION  07-25-2009   C5 -- C6  . APPENDECTOMY  age 44  . APPLICATION OF CRANIAL NAVIGATION N/A 06/23/2017   Procedure: APPLICATION OF CRANIAL NAVIGATION;  Surgeon:  Kary Kos, MD;  Location: [facility];  Service: Neurosurgery;  Laterality: N/A;  . BACK SURGERY    . BRAIN SURGERY     VP shunt  . BREAST REDUCTION SURGERY Bilateral 06/07/2013   Procedure: BILATERAL MAMMARY REDUCTION  (BREAST);  Surgeon: Theodoro Kos, DO;  Location: [place];  Service: Plastics;  Laterality: Bilateral;  . BREAST REDUCTION SURGERY Bilateral 06/07/2013   Procedure:  LIPOSUCTION;  Surgeon: Theodoro Kos, DO;  Location: [facility];  Service: Plastics;  Laterality: Bilateral;  . CARPAL TUNNEL RELEASE Bilateral 2001  . COLONOSCOPY  X 2   "polyps were benign"  . DILATION AND CURETTAGE OF UTERUS    . ESOPHAGOGASTRODUODENOSCOPY    . gastric banding undone  11/2014  . LAPAROSCOPIC CHOLECYSTECTOMY  ~ 2000  . LAPAROSCOPIC GASTRIC BANDING  2011  . LAPAROSCOPIC REVISION  VENTRICULAR-PERITONEAL (V-P) SHUNT N/A 06/23/2017   Procedure: Shunt Placment stereotactic ventricular catheter placement and general surgery abdominal exposure  (Laparoscopic vs. Open) Brain Lab;  Surgeon: Clovis Riley, MD;  Location: [place] [facility];  Service: General;  Laterality: N/A;  . LAPAROSCOPY N/A 06/23/2017   Procedure: LAPAROSCOPY DIAGNOSTIC;  Surgeon: Clovis Riley, MD;  Location: [place];  Service: General;  Laterality: N/A;  . LUMBAR LAMINECTOMY/DECOMPRESSION MICRODISCECTOMY N/A 07/23/2016   Procedure: Decompressive Lumbar Laminectomy Lumbar two-three  for Repair of  Cerberal Spinal Fluid  Leak;  Surgeon: Kary Kos, MD;  Location: [facility];  Service: Neurosurgery;  Laterality: N/A;  . LUMBAR LAMINECTOMY/DECOMPRESSION MICRODISCECTOMY Left 03/01/2019   Procedure: LEFT LUMBAR THREE-FOUR EXTRAFORAMINAL MICRODISCECTOMY;  Surgeon: Kary Kos, MD;  Location: [place];  Service: Neurosurgery;  Laterality: Left;  . PLACEMENT LUMBOPERITONEAL SHUNT FOR CEREBROSPINAL FLUID DIVERSION AWAY FROM C8 PERINEURAL CYST  09-05-2003  &  12-24-2003   08-31-2003  REMOVAL SHUNT  . POSTERIOR CERVICAL LAMINECTOMY  03-01-2001   C7-8 and Exploration C8 perineural cyst  . POSTERIOR LUMBAR LAMINECTOMY L4-5/  DISKECTOMY L5-S1  05-20-2005  . RE-DO DECOMPRESSION LAMINECTOMY AND FUSION L4-5  &  L5-S1  12-20-2006   02-04-2007--  RE-EXPLORATION L4 to S1, I & D LUMBAR WOUND HEMATOMA  . REDUCTION MAMMAPLASTY    . SHUNT REMOVAL N/A 07/10/2016   Procedure: SHUNT REMOVAL;  Surgeon: Kary Kos, MD;  Location: [facility];  Service: Neurosurgery;  Laterality: N/A;  SHUNT REMOVAL  . SPINAL CORD STIMULATOR INSERTION N/A 04/26/2015   Procedure: LUMBAR SPINAL CORD STIMULATOR INSERTION;  Surgeon: Clydell Hakim, MD;  Location: [facility];  Service: Neurosurgery;  Laterality: N/A;  LUMBAR SPINAL CORD STIMULATOR INSERTION  . SPINAL CORD STIMULATOR REMOVAL N/A 06/28/2019   Procedure: Removal of dorsal column stimulator and generator -;  Surgeon: Kary Kos, MD;  Location: [place];  Service: Neurosurgery;  Laterality: N/A;  . TENNIS ELBOW RELEASE/NIRSCHEL PROCEDURE Left 12/13/2014   Procedure: LEFT ELBOW LATERAL EPICONDYLAR DEBRIDEMENT AND OR REPAIR -RECONSTRUCTION ;  Surgeon: Iran Planas, MD;  Location: [place];  Service: Orthopedics;  Laterality: Left;  . TOTAL ABDOMINAL HYSTERECTOMY  1994   w/  Bilateral Salpinoophorectomy  . VENTRICULOPERITONEAL SHUNT N/A 06/23/2017   Procedure: VENTRICULAR-PERITONEAL SHUNT INSERTION WITH ABDOMINAL LAPARASCOPY AND BRAINLAB NAVIGATION;  Surgeon: Kary Kos, MD;  Location: [facility];  Service: Neurosurgery;  Laterality: N/A;    Social History   Socioeconomic History  . Marital status: Legally Separated    Spouse name: [name]  . Number of children: 2  . Years of education: Assoc  . Highest education level: Associate degree: academic program  Occupational History  . Occupation: Disabled   Tobacco Use  . Smoking status: Never Smoker  . Smokeless tobacco: Never Used  Vaping Use  . Vaping Use: Never used  Substance and Sexual Activity  . Alcohol use: No  . Drug use: No  . Sexual activity: Not Currently    Birth control/protection: Surgical  Other Topics Concern  . Not on file  Social History Narrative   Caffeine: occasionally    Mrs Goatley is married, on permanent disability and lives at home with her husband, her support system. She is independent in her care needs. She denies concern with transportation to medical appointments She is on a fixed income   Social Determinants of Health   Financial Resource Strain:   . Difficulty of Paying Living Expenses: Not on file  Food Insecurity:   . Worried About Charity fundraiser in the Last Year: Not on file  . Ran Out of Food in the Last Year: Not on file  Transportation Needs:   . Lack of Transportation (Medical): Not on file  . Lack of Transportation (Non-Medical): Not on file  Physical Activity:   . Days of Exercise per Week: Not  on file  . Minutes of Exercise per Session: Not on file  Stress:   . Feeling of Stress : Not on file  Social Connections:   . Frequency of Communication with Friends and Family: Not on file  . Frequency of Social Gatherings with Friends and Family: Not on file  . Attends Religious Services: Not on file  . Active Member of Clubs or Organizations: Not on file  . Attends Archivist Meetings: Not on file  . Marital Status: Not on file    Family History  Problem Relation Age of Onset  . Depression Other   . Colon cancer Father   . Ovarian cancer Maternal Grandmother   . Pancreatic cancer Maternal Grandfather     Health Maintenance  Topic Date Due  . Hepatitis C Screening  Never done  . COVID-19 Vaccine (1) Never done  . COLONOSCOPY  Never done  . INFLUENZA VACCINE  12/17/2019  . TETANUS/TDAP  07/18/2020 (Originally 05/19/2019)  . MAMMOGRAM  07/07/2020  . HIV Screening  Completed     ----------------------------------------------------------------------------------------------------------------------------------------------------------------------------------------------------------------- Physical Exam BP 133/88 (BP Location: Left Arm, Patient Position: Sitting, Cuff Size: Large)   Pulse (!) 106   Temp 97.7 F (36.5 C) (Oral)   Ht 5' 2.99" (1.6 m)   Wt 176 lb 12.8 oz (80.2 kg)   SpO2 98%   BMI 31.33 kg/m   Physical Exam Constitutional:      Appearance: Normal appearance.  Cardiovascular:     Rate and Rhythm: Normal rate and regular rhythm.  Pulmonary:     Effort: Pulmonary effort is normal.     Breath sounds: Normal breath sounds.  Neurological:     General: No focal deficit present.     Mental Status: She is alert.  Psychiatric:        Mood and Affect: Mood normal.        Behavior: Behavior normal.      ------------------------------------------------------------------------------------------------------------------------------------------------------------------------------------------------------------------- Assessment and Plan  Elevated blood pressure reading BP is pretty well controlled.  Elevations in BP may be situational.  Also admits to increased stress due to ongoing divorce.  Recommend checking BP readings at home over the next couple of weeks and following low sodium diet.  She will let me know readings after a couple of weeks.  Abnormal urinalysis Abnormal UA in urology sent for culture. She is having some mild frequency.    No orders of the defined types were placed in this encounter.   No follow-ups on file.    This visit occurred during the SARS-CoV-2 public health emergency.  Safety protocols were in place, including screening questions prior to the visit, additional usage of staff PPE, and extensive cleaning of exam room while observing appropriate contact time as indicated for disinfecting solutions.

## 2020-02-13 NOTE — Assessment & Plan Note (Signed)
BP is pretty well controlled.  Elevations in BP may be situational.  Also admits to increased stress due to ongoing divorce.  Recommend checking BP readings at home over the next couple of weeks and following low sodium diet.  She will let me know readings after a couple of weeks.

## 2020-02-13 NOTE — Patient Instructions (Signed)
Check blood pressure twice per day over the next couple of weeks, send these to me through mychart.  Watch salt intake (check food labels, don't add extra salt).  Stay below 2300mg  of salt each day.  We'll call you with urine results.

## 2020-02-13 NOTE — Assessment & Plan Note (Signed)
Abnormal UA in urology sent for culture. She is having some mild frequency.

## 2020-02-13 NOTE — Progress Notes (Signed)
culture

## 2020-02-14 ENCOUNTER — Encounter: Payer: Self-pay | Admitting: Cardiology

## 2020-02-14 ENCOUNTER — Other Ambulatory Visit: Payer: Self-pay

## 2020-02-14 ENCOUNTER — Ambulatory Visit (INDEPENDENT_AMBULATORY_CARE_PROVIDER_SITE_OTHER): Payer: Medicare HMO | Admitting: Cardiology

## 2020-02-14 VITALS — BP 147/95 | HR 85 | Ht 62.99 in | Wt 176.1 lb

## 2020-02-14 DIAGNOSIS — E7800 Pure hypercholesterolemia, unspecified: Secondary | ICD-10-CM | POA: Diagnosis not present

## 2020-02-14 DIAGNOSIS — I2129 ST elevation (STEMI) myocardial infarction involving other sites: Secondary | ICD-10-CM | POA: Diagnosis not present

## 2020-02-14 DIAGNOSIS — R072 Precordial pain: Secondary | ICD-10-CM

## 2020-02-14 MED ORDER — METOPROLOL TARTRATE 100 MG PO TABS: ORAL_TABLET | ORAL | 0 refills | Status: DC

## 2020-02-14 NOTE — Patient Instructions (Signed)
  Testing/Procedures:  Your cardiac CT will be scheduled at  [facility] [place], [place] [address] [phone]  If scheduled at [facility], please arrive at the [facility] main entrance of [facility] 30 minutes prior to test start time. Proceed to the [facility] Radiology Department (first floor) to check-in and test prep.  Please follow these instructions carefully (unless otherwise directed):  On the Night Before the Test: . Be sure to Drink plenty of water. . Do not consume any caffeinated/decaffeinated beverages or chocolate 12 hours prior to your test. . Do not take any antihistamines 12 hours prior to your test.  On the Day of the Test: . Drink plenty of water. Do not drink any water within one hour of the test. . Do not eat any food 4 hours prior to the test. . You may take your regular medications prior to the test.  . Take metoprolol (Lopressor) 100 mg two hours prior to test. . HOLD Furosemide/Hydrochlorothiazide morning of the test. . FEMALES- please wear underwire-free bra if available       After the Test: . Drink plenty of water. . After receiving IV contrast, you may experience a mild flushed feeling. This is normal. . On occasion, you may experience a mild rash up to 24 hours after the test. This is not dangerous. If this occurs, you can take Benadryl 25 mg and increase your fluid intake. . If you experience trouble breathing, this can be serious. If it is severe call 911 IMMEDIATELY. If it is mild, please call our office. . If you take any of these medications: Glipizide/Metformin, Avandament, Glucavance, please do not take 48 hours after completing test unless otherwise instructed.   Once we have confirmed authorization from your insurance company, we will call you to set up a date and time for your test. Based on how quickly your insurance processes prior authorizations requests, please allow up to 4 weeks to be  contacted for scheduling your Cardiac CT appointment. Be advised that routine Cardiac CT appointments could be scheduled as many as 8 weeks after your provider has ordered it.  For non-scheduling related questions, please contact the cardiac imaging nurse navigator should you have any questions/concerns: Marchia Bond, Cardiac Imaging Nurse Navigator Burley Saver, Interim Cardiac Imaging Nurse [facility] Direct Office Dial: [phone]   For scheduling needs, including cancellations and rescheduling, please call Vivien Rota at [phone], option 3.      Follow-Up: At [facility], you and your health needs are our priority.  As part of our continuing mission to provide you with exceptional heart care, we have created designated Provider Care Teams.  These Care Teams include your primary Cardiologist (physician) and Advanced Practice Providers (APPs -  Physician Assistants and Nurse Practitioners) who all work together to provide you with the care you need, when you need it.  We recommend signing up for the patient portal called "MyChart".  Sign up information is provided on this After Visit Summary.  MyChart is used to connect with patients for Virtual Visits (Telemedicine).  Patients are able to view lab/test results, encounter notes, upcoming appointments, etc.  Non-urgent messages can be sent to your provider as well.   To learn more about what you can do with MyChart, go to [email or web address].    Your next appointment:    As needed

## 2020-02-16 ENCOUNTER — Other Ambulatory Visit: Payer: Self-pay | Admitting: Family Medicine

## 2020-02-16 LAB — URINE CULTURE
MICRO NUMBER:: 11005882
SPECIMEN QUALITY:: ADEQUATE

## 2020-02-16 MED ORDER — NITROFURANTOIN MONOHYD MACRO 100 MG PO CAPS: 100.0000 mg | ORAL_CAPSULE | Freq: Two times a day (BID) | ORAL | 0 refills | Status: AC

## 2020-02-20 DIAGNOSIS — M542 Cervicalgia: Secondary | ICD-10-CM | POA: Diagnosis not present

## 2020-02-20 DIAGNOSIS — M5440 Lumbago with sciatica, unspecified side: Secondary | ICD-10-CM | POA: Diagnosis not present

## 2020-02-21 ENCOUNTER — Encounter: Payer: Self-pay | Admitting: Family Medicine

## 2020-02-21 NOTE — Telephone Encounter (Signed)
Yes, we need to reassess and repeat Ua/culture.

## 2020-02-22 ENCOUNTER — Encounter: Payer: Self-pay | Admitting: Family Medicine

## 2020-02-22 ENCOUNTER — Ambulatory Visit (INDEPENDENT_AMBULATORY_CARE_PROVIDER_SITE_OTHER): Payer: Medicare HMO | Admitting: Family Medicine

## 2020-02-22 ENCOUNTER — Other Ambulatory Visit: Payer: Self-pay

## 2020-02-22 VITALS — BP 123/82 | HR 93 | Wt 175.0 lb

## 2020-02-22 DIAGNOSIS — N3090 Cystitis, unspecified without hematuria: Secondary | ICD-10-CM | POA: Diagnosis not present

## 2020-02-22 DIAGNOSIS — R309 Painful micturition, unspecified: Secondary | ICD-10-CM

## 2020-02-22 DIAGNOSIS — R8290 Unspecified abnormal findings in urine: Secondary | ICD-10-CM

## 2020-02-22 LAB — POCT URINALYSIS DIP (CLINITEK)
Bilirubin, UA: NEGATIVE
Blood, UA: NEGATIVE
Glucose, UA: NEGATIVE mg/dL
Ketones, POC UA: NEGATIVE mg/dL
Nitrite, UA: NEGATIVE
POC PROTEIN,UA: NEGATIVE
Spec Grav, UA: >=1.03 — AB (ref 1.010–1.025)
Urobilinogen, UA: 0.2 E.U./dL
pH, UA: 5.5 (ref 5.0–8.0)

## 2020-02-22 MED ORDER — SULFAMETHOXAZOLE-TRIMETHOPRIM 800-160 MG PO TABS: 1.0000 | ORAL_TABLET | Freq: Two times a day (BID) | ORAL | 0 refills | Status: AC

## 2020-02-22 NOTE — Assessment & Plan Note (Signed)
Will treat with bactrim and send for culture again If this continues to be a recurrent issue we discussed referral to urology.

## 2020-02-22 NOTE — Telephone Encounter (Signed)
Patient scheduled.

## 2020-02-22 NOTE — Progress Notes (Signed)
Katrina Fernandez - 54 y.o. female MRN [id number]  Date of birth: Feb 01, 1966  Subjective Chief Complaint  Patient presents with  . Urinary Tract Infection    HPI DERYA DETTMANN is a 54 y.o. female here today with complaint of continued dysuria and bladder pressure.  Recently had similar symptoms and urine culture with pan sensitive E. Coli treated with course of macrobid.  She  completed course but symptoms did not resolve.  She denies fever, chills, flank pain, nausea or vomiting.   ROS:  A comprehensive ROS was completed and negative except as noted per HPI  Allergies  Allergen Reactions  . Penicillins Anaphylaxis    Has patient had a PCN reaction causing immediate rash, facial/tongue/throat swelling, SOB or lightheadedness with hypotension: Yes Has patient had a PCN reaction causing severe rash involving mucus membranes or skin necrosis: No Has patient had a PCN reaction that required hospitalization No Has patient had a PCN reaction occurring within the last 10 years: No If all of the above answers are "NO", then may proceed with Cephalosporin use.   . Lactose Nausea And Vomiting and Swelling       . Chlorhexidine Rash and Other (See Comments)    Dermatitis - Allergic  . Tape Rash    Plastic tape, silicone tape, Steri Strips    Past Medical History:  Diagnosis Date  . Anemia   . Anxiety   . Arthritis    "back and knees" (07/23/2016)  . Chiari malformation type I ([place]) dx'd 2014  . Chronic back pain    buldging, DDD; "neck and lower back" (07/23/2016)  . Chronic constipation    takes Miralax daily  . Chronic pain syndrome    takes Methadone and Keppra daily  . Depression    takes Effexor daily  . Endometriosis    1995  . Fibromyalgia   . GERD (gastroesophageal reflux disease)    not taking anything at the present time  . History of DVT of lower extremity 07/2012   LEFT LEG --  3/ 2014.Was on a blood transfusion for 6 months  . History of kidney stones   .  History of MRSA infection 10 yrs ago  . Hyperlipidemia    takes Simvastatin daily  . Migraine    "daily recently" (07/23/2016)  . Muscle spasm    takes Zanaflex nightly  . Peripheral edema    takes Lasix daily as needed  . Peripheral neuropathy   . Pleurisy 2000  . Pneumonia    hx of-7-8 yrs ago  . PONV (postoperative nausea and vomiting)   . Seasonal allergies    takes Claritin daily  . Spinal headache    "since 2002; still get them; try to manage it w/lying flat"    Past Surgical History:  Procedure Laterality Date  . ANTERIOR CERVICAL DECOMP/DISCECTOMY FUSION  07-25-2009   C5 -- C6  . APPENDECTOMY  age 70  . APPLICATION OF CRANIAL NAVIGATION N/A 06/23/2017   Procedure: APPLICATION OF CRANIAL NAVIGATION;  Surgeon: Kary Kos, MD;  Location: [facility];  Service: Neurosurgery;  Laterality: N/A;  . BACK SURGERY    . BRAIN SURGERY     VP shunt  . BREAST REDUCTION SURGERY Bilateral 06/07/2013   Procedure: BILATERAL MAMMARY REDUCTION  (BREAST);  Surgeon: Theodoro Kos, DO;  Location: [facility];  Service: Plastics;  Laterality: Bilateral;  . BREAST REDUCTION SURGERY Bilateral 06/07/2013   Procedure:  LIPOSUCTION;  Surgeon: Theodoro Kos, DO;  Location: [facility];  Service: Plastics;  Laterality: Bilateral;  . CARPAL TUNNEL RELEASE Bilateral 2001  . COLONOSCOPY  X 2   "polyps were benign"  . DILATION AND CURETTAGE OF UTERUS    . ESOPHAGOGASTRODUODENOSCOPY    . gastric banding undone  11/2014  . LAPAROSCOPIC CHOLECYSTECTOMY  ~ 2000  . LAPAROSCOPIC GASTRIC BANDING  2011  . LAPAROSCOPIC REVISION VENTRICULAR-PERITONEAL (V-P) SHUNT N/A 06/23/2017   Procedure: Shunt Placment stereotactic ventricular catheter placement and general surgery abdominal exposure  (Laparoscopic vs. Open) Brain Lab;  Surgeon: Clovis Riley, MD;  Location: [facility];  Service: General;  Laterality: N/A;  . LAPAROSCOPY N/A 06/23/2017   Procedure: LAPAROSCOPY DIAGNOSTIC;  Surgeon: Clovis Riley, MD;  Location: [facility];  Service: General;  Laterality: N/A;  . LUMBAR LAMINECTOMY/DECOMPRESSION MICRODISCECTOMY N/A 07/23/2016   Procedure: Decompressive Lumbar Laminectomy Lumbar two-three  for Repair of  Cerberal Spinal Fluid  Leak;  Surgeon: Kary Kos, MD;  Location: [facility];  Service: Neurosurgery;  Laterality: N/A;  . LUMBAR LAMINECTOMY/DECOMPRESSION MICRODISCECTOMY Left 03/01/2019   Procedure: LEFT LUMBAR THREE-FOUR EXTRAFORAMINAL MICRODISCECTOMY;  Surgeon: Kary Kos, MD;  Location: [facility];  Service: Neurosurgery;  Laterality: Left;  . PLACEMENT LUMBOPERITONEAL SHUNT FOR CEREBROSPINAL FLUID DIVERSION AWAY FROM C8 PERINEURAL CYST  09-05-2003  &  12-24-2003   08-31-2003  REMOVAL SHUNT  . POSTERIOR CERVICAL LAMINECTOMY  03-01-2001   C7-8 and Exploration C8 perineural cyst  . POSTERIOR LUMBAR LAMINECTOMY L4-5/  DISKECTOMY L5-S1  05-20-2005  . RE-DO DECOMPRESSION LAMINECTOMY AND FUSION L4-5  &  L5-S1  12-20-2006   02-04-2007--  RE-EXPLORATION L4 to S1, I & D LUMBAR WOUND HEMATOMA  . REDUCTION MAMMAPLASTY    . SHUNT REMOVAL N/A 07/10/2016   Procedure: SHUNT REMOVAL;  Surgeon: Kary Kos, MD;  Location: [facility];  Service: Neurosurgery;  Laterality: N/A;  SHUNT REMOVAL  . SPINAL CORD STIMULATOR INSERTION N/A 04/26/2015   Procedure: LUMBAR SPINAL CORD STIMULATOR INSERTION;  Surgeon: Clydell Hakim, MD;  Location: [facility];  Service: Neurosurgery;  Laterality: N/A;  LUMBAR SPINAL CORD STIMULATOR INSERTION  . SPINAL CORD STIMULATOR REMOVAL N/A 06/28/2019   Procedure: Removal of dorsal column stimulator and generator -;  Surgeon: Kary Kos, MD;  Location: [facility];  Service: Neurosurgery;  Laterality: N/A;  . TENNIS ELBOW RELEASE/NIRSCHEL PROCEDURE Left 12/13/2014   Procedure: LEFT ELBOW LATERAL EPICONDYLAR DEBRIDEMENT AND OR REPAIR -RECONSTRUCTION ;  Surgeon: Iran Planas, MD;  Location: [facility];  Service: Orthopedics;  Laterality: Left;  . TOTAL ABDOMINAL HYSTERECTOMY  1994   w/   Bilateral Salpinoophorectomy  . VENTRICULOPERITONEAL SHUNT N/A 06/23/2017   Procedure: VENTRICULAR-PERITONEAL SHUNT INSERTION WITH ABDOMINAL LAPARASCOPY AND BRAINLAB NAVIGATION;  Surgeon: Kary Kos, MD;  Location: [facility];  Service: Neurosurgery;  Laterality: N/A;    Social History   Socioeconomic History  . Marital status: Legally Separated    Spouse name: [name]  . Number of children: 2  . Years of education: Assoc  . Highest education level: Associate degree: academic program  Occupational History  . Occupation: Disabled   Tobacco Use  . Smoking status: Never Smoker  . Smokeless tobacco: Never Used  Vaping Use  . Vaping Use: Never used  Substance and Sexual Activity  . Alcohol use: No  . Drug use: No  . Sexual activity: Not Currently    Birth control/protection: Surgical  Other Topics Concern  . Not on file  Social History Narrative   Caffeine: occasionally    Mrs Raynes is married,  on permanent disability and lives at home with her husband, her support system. She is independent in her care needs. She denies concern with transportation to medical appointments She is on a fixed income   Social Determinants of Health   Financial Resource Strain:   . Difficulty of Paying Living Expenses: Not on file  Food Insecurity:   . Worried About Charity fundraiser in the Last Year: Not on file  . Ran Out of Food in the Last Year: Not on file  Transportation Needs:   . Lack of Transportation (Medical): Not on file  . Lack of Transportation (Non-Medical): Not on file  Physical Activity:   . Days of Exercise per Week: Not on file  . Minutes of Exercise per Session: Not on file  Stress:   . Feeling of Stress : Not on file  Social Connections:   . Frequency of Communication with Friends and Family: Not on file  . Frequency of Social Gatherings with Friends and Family: Not on file  . Attends Religious Services: Not on file  . Active Member of Clubs or Organizations: Not on file  .  Attends Archivist Meetings: Not on file  . Marital Status: Not on file    Family History  Problem Relation Age of Onset  . Depression Other   . Colon cancer Father   . Ovarian cancer Maternal Grandmother   . Pancreatic cancer Maternal Grandfather     Health Maintenance  Topic Date Due  . Hepatitis C Screening  Never done  . COVID-19 Vaccine (1) Never done  . COLONOSCOPY  Never done  . INFLUENZA VACCINE  12/17/2019  . TETANUS/TDAP  07/18/2020 (Originally 05/19/2019)  . MAMMOGRAM  07/07/2020  . HIV Screening  Completed     ----------------------------------------------------------------------------------------------------------------------------------------------------------------------------------------------------------------- Physical Exam BP 123/82 (BP Location: Left Arm, Patient Position: Sitting, Cuff Size: Normal)   Pulse 93   Wt 175 lb (79.4 kg)   SpO2 97%   BMI 31.01 kg/m   Physical Exam Constitutional:      Appearance: Normal appearance.  HENT:     Head: Normocephalic and atraumatic.  Eyes:     General: No scleral icterus. Abdominal:     Tenderness: There is no right CVA tenderness or left CVA tenderness.  Neurological:     General: No focal deficit present.     Mental Status: She is alert.  Psychiatric:        Mood and Affect: Mood normal.     ------------------------------------------------------------------------------------------------------------------------------------------------------------------------------------------------------------------- Assessment and Plan  Cystitis Will treat with bactrim and send for culture again If this continues to be a recurrent issue we discussed referral to urology.    Meds ordered this encounter  Medications  . sulfamethoxazole-trimethoprim (BACTRIM DS) 800-160 MG tablet    Sig: Take 1 tablet by mouth 2 (two) times daily for 5 days.    Dispense:  10 tablet    Refill:  0    No follow-ups on  file.    This visit occurred during the SARS-CoV-2 public health emergency.  Safety protocols were in place, including screening questions prior to the visit, additional usage of staff PPE, and extensive cleaning of exam room while observing appropriate contact time as indicated for disinfecting solutions.

## 2020-02-23 LAB — URINE CULTURE
MICRO NUMBER:: 11045164
SPECIMEN QUALITY:: ADEQUATE

## 2020-02-27 ENCOUNTER — Ambulatory Visit (INDEPENDENT_AMBULATORY_CARE_PROVIDER_SITE_OTHER): Payer: Medicare HMO | Admitting: Psychology

## 2020-02-27 DIAGNOSIS — R69 Illness, unspecified: Secondary | ICD-10-CM | POA: Diagnosis not present

## 2020-02-27 DIAGNOSIS — F4323 Adjustment disorder with mixed anxiety and depressed mood: Secondary | ICD-10-CM

## 2020-02-27 DIAGNOSIS — F411 Generalized anxiety disorder: Secondary | ICD-10-CM

## 2020-03-07 ENCOUNTER — Other Ambulatory Visit: Payer: Self-pay | Admitting: Family Medicine

## 2020-03-08 ENCOUNTER — Other Ambulatory Visit: Payer: Self-pay | Admitting: Family Medicine

## 2020-03-12 ENCOUNTER — Ambulatory Visit (INDEPENDENT_AMBULATORY_CARE_PROVIDER_SITE_OTHER): Payer: Medicare HMO | Admitting: Psychology

## 2020-03-12 DIAGNOSIS — F411 Generalized anxiety disorder: Secondary | ICD-10-CM | POA: Diagnosis not present

## 2020-03-12 DIAGNOSIS — F4323 Adjustment disorder with mixed anxiety and depressed mood: Secondary | ICD-10-CM

## 2020-03-12 DIAGNOSIS — R69 Illness, unspecified: Secondary | ICD-10-CM | POA: Diagnosis not present

## 2020-03-20 ENCOUNTER — Other Ambulatory Visit: Payer: Self-pay | Admitting: Family Medicine

## 2020-03-26 ENCOUNTER — Ambulatory Visit (INDEPENDENT_AMBULATORY_CARE_PROVIDER_SITE_OTHER): Payer: Medicare HMO | Admitting: Psychology

## 2020-03-26 DIAGNOSIS — F4323 Adjustment disorder with mixed anxiety and depressed mood: Secondary | ICD-10-CM

## 2020-03-26 DIAGNOSIS — R69 Illness, unspecified: Secondary | ICD-10-CM | POA: Diagnosis not present

## 2020-03-26 DIAGNOSIS — F411 Generalized anxiety disorder: Secondary | ICD-10-CM | POA: Diagnosis not present

## 2020-03-27 DIAGNOSIS — Z79899 Other long term (current) drug therapy: Secondary | ICD-10-CM | POA: Diagnosis not present

## 2020-03-27 DIAGNOSIS — M7712 Lateral epicondylitis, left elbow: Secondary | ICD-10-CM | POA: Diagnosis not present

## 2020-03-27 DIAGNOSIS — M79609 Pain in unspecified limb: Secondary | ICD-10-CM | POA: Diagnosis not present

## 2020-03-27 DIAGNOSIS — R69 Illness, unspecified: Secondary | ICD-10-CM | POA: Diagnosis not present

## 2020-03-27 DIAGNOSIS — M542 Cervicalgia: Secondary | ICD-10-CM | POA: Diagnosis not present

## 2020-03-27 DIAGNOSIS — F1190 Opioid use, unspecified, uncomplicated: Secondary | ICD-10-CM | POA: Diagnosis not present

## 2020-04-09 ENCOUNTER — Ambulatory Visit (INDEPENDENT_AMBULATORY_CARE_PROVIDER_SITE_OTHER): Payer: Medicare HMO | Admitting: Psychology

## 2020-04-09 DIAGNOSIS — F4323 Adjustment disorder with mixed anxiety and depressed mood: Secondary | ICD-10-CM

## 2020-04-09 DIAGNOSIS — R69 Illness, unspecified: Secondary | ICD-10-CM | POA: Diagnosis not present

## 2020-04-09 DIAGNOSIS — F411 Generalized anxiety disorder: Secondary | ICD-10-CM

## 2020-04-10 ENCOUNTER — Ambulatory Visit (HOSPITAL_COMMUNITY): Payer: Medicare HMO

## 2020-04-10 ENCOUNTER — Telehealth (HOSPITAL_COMMUNITY): Payer: Self-pay | Admitting: Emergency Medicine

## 2020-04-10 NOTE — Telephone Encounter (Signed)
Reaching out to patient to offer assistance regarding upcoming cardiac imaging study; pt verbalizes understanding of appt date/time, parking situation and where to check in, pre-test NPO status and medications ordered, and verified current allergies; name and call back number provided for further questions should they arise Marchia Bond RN Navigator Cardiac Imaging [facility] [phone] office [phone] cell  Difficult IV stick 100mg  metoprolol tartrate 2 hr prior to scan Katrina Fernandez

## 2020-04-15 ENCOUNTER — Ambulatory Visit (INDEPENDENT_AMBULATORY_CARE_PROVIDER_SITE_OTHER): Payer: Medicare HMO | Admitting: Family Medicine

## 2020-04-15 ENCOUNTER — Encounter: Payer: Self-pay | Admitting: Family Medicine

## 2020-04-15 ENCOUNTER — Encounter (HOSPITAL_COMMUNITY): Payer: Self-pay

## 2020-04-15 ENCOUNTER — Ambulatory Visit (HOSPITAL_COMMUNITY)
Admission: RE | Admit: 2020-04-15 | Discharge: 2020-04-15 | Disposition: A | Discharge status: Home / Self Care | Payer: Medicare HMO | Source: Ambulatory Visit | Attending: Cardiology | Admitting: Cardiology

## 2020-04-15 ENCOUNTER — Other Ambulatory Visit: Payer: Self-pay

## 2020-04-15 VITALS — BP 133/84 | HR 97

## 2020-04-15 DIAGNOSIS — R109 Unspecified abdominal pain: Secondary | ICD-10-CM | POA: Diagnosis not present

## 2020-04-15 DIAGNOSIS — R072 Precordial pain: Secondary | ICD-10-CM | POA: Insufficient documentation

## 2020-04-15 DIAGNOSIS — Z006 Encounter for examination for normal comparison and control in clinical research program: Secondary | ICD-10-CM

## 2020-04-15 DIAGNOSIS — R8290 Unspecified abnormal findings in urine: Secondary | ICD-10-CM | POA: Diagnosis not present

## 2020-04-15 DIAGNOSIS — N3090 Cystitis, unspecified without hematuria: Secondary | ICD-10-CM

## 2020-04-15 DIAGNOSIS — N390 Urinary tract infection, site not specified: Secondary | ICD-10-CM

## 2020-04-15 LAB — POCT URINALYSIS DIP (CLINITEK)
Bilirubin, UA: NEGATIVE
Blood, UA: NEGATIVE
Glucose, UA: NEGATIVE mg/dL
Ketones, POC UA: NEGATIVE mg/dL
Nitrite, UA: POSITIVE — AB
POC PROTEIN,UA: NEGATIVE
Spec Grav, UA: >=1.03 — AB (ref 1.010–1.025)
Urobilinogen, UA: 0.2 E.U./dL
pH, UA: 5.5 (ref 5.0–8.0)

## 2020-04-15 MED ORDER — IOHEXOL 350 MG/ML SOLN
80.0000 mL | Freq: Once | INTRAVENOUS | Status: AC | PRN
  Administered 2020-04-15: 80 mL via INTRAVENOUS

## 2020-04-15 MED ORDER — NITROGLYCERIN 0.4 MG SL SUBL
SUBLINGUAL_TABLET | SUBLINGUAL | Status: AC
  Filled 2020-04-15: qty 2

## 2020-04-15 MED ORDER — NITROFURANTOIN MONOHYD MACRO 100 MG PO CAPS: 100.0000 mg | ORAL_CAPSULE | Freq: Two times a day (BID) | ORAL | 0 refills | Status: AC

## 2020-04-15 MED ORDER — NITROGLYCERIN 0.4 MG SL SUBL
0.8000 mg | SUBLINGUAL_TABLET | Freq: Once | SUBLINGUAL | Status: AC
  Administered 2020-04-15: 0.8 mg via SUBLINGUAL

## 2020-04-15 MED ORDER — PHENAZOPYRIDINE HCL 200 MG PO TABS: 200.0000 mg | ORAL_TABLET | Freq: Three times a day (TID) | ORAL | 0 refills | Status: DC | PRN

## 2020-04-15 NOTE — Progress Notes (Signed)
Katrina Fernandez - 54 y.o. female MRN [id number]  Date of birth: December 23, 1965  Subjective Chief Complaint  Patient presents with  . Urinary Tract Infection    HPI Katrina Fernandez is a 54 y.o. female here today with symptoms of UTI.  Current symptoms including lower abdominal pressure and pain, dysuria, frequency and urgency.  She has not seen blood in her urine.  She has had recurrent UTI over the past few months.  Previous cultures with pan-sensitive E. Coli.  She has not had fever, chills, nausea, vomiting, or flank pain.    ROS:  A comprehensive ROS was completed and negative except as noted per HPI  Allergies  Allergen Reactions  . Penicillins Anaphylaxis    Has patient had a PCN reaction causing immediate rash, facial/tongue/throat swelling, SOB or lightheadedness with hypotension: Yes Has patient had a PCN reaction causing severe rash involving mucus membranes or skin necrosis: No Has patient had a PCN reaction that required hospitalization No Has patient had a PCN reaction occurring within the last 10 years: No If all of the above answers are "NO", then may proceed with Cephalosporin use.   . Lactose Nausea And Vomiting and Swelling       . Chlorhexidine Rash and Other (See Comments)    Dermatitis - Allergic  . Tape Rash    Plastic tape, silicone tape, Steri Strips    Past Medical History:  Diagnosis Date  . Anemia   . Anxiety   . Arthritis    "back and knees" (07/23/2016)  . Chiari malformation type I ([place]) dx'd 2014  . Chronic back pain    buldging, DDD; "neck and lower back" (07/23/2016)  . Chronic constipation    takes Miralax daily  . Chronic pain syndrome    takes Methadone and Keppra daily  . Depression    takes Effexor daily  . Endometriosis    1995  . Fibromyalgia   . GERD (gastroesophageal reflux disease)    not taking anything at the present time  . History of DVT of lower extremity 07/2012   LEFT LEG --  3/ 2014.Was on a blood transfusion for 6  months  . History of kidney stones   . History of MRSA infection 10 yrs ago  . Hyperlipidemia    takes Simvastatin daily  . Migraine    "daily recently" (07/23/2016)  . Muscle spasm    takes Zanaflex nightly  . Peripheral edema    takes Lasix daily as needed  . Peripheral neuropathy   . Pleurisy 2000  . Pneumonia    hx of-7-8 yrs ago  . PONV (postoperative nausea and vomiting)   . Seasonal allergies    takes Claritin daily  . Spinal headache    "since 2002; still get them; try to manage it w/lying flat"    Past Surgical History:  Procedure Laterality Date  . ANTERIOR CERVICAL DECOMP/DISCECTOMY FUSION  07-25-2009   C5 -- C6  . APPENDECTOMY  age 54  . APPLICATION OF CRANIAL NAVIGATION N/A 06/23/2017   Procedure: APPLICATION OF CRANIAL NAVIGATION;  Surgeon: Kary Kos, MD;  Location: [facility];  Service: Neurosurgery;  Laterality: N/A;  . BACK SURGERY    . BRAIN SURGERY     VP shunt  . BREAST REDUCTION SURGERY Bilateral 06/07/2013   Procedure: BILATERAL MAMMARY REDUCTION  (BREAST);  Surgeon: Theodoro Kos, DO;  Location: [facility];  Service: Plastics;  Laterality: Bilateral;  . BREAST REDUCTION SURGERY Bilateral 06/07/2013   Procedure:  LIPOSUCTION;  Surgeon: Theodoro Kos, DO;  Location: [facility];  Service: Plastics;  Laterality: Bilateral;  . CARPAL TUNNEL RELEASE Bilateral 2001  . COLONOSCOPY  X 2   "polyps were benign"  . DILATION AND CURETTAGE OF UTERUS    . ESOPHAGOGASTRODUODENOSCOPY    . gastric banding undone  11/2014  . LAPAROSCOPIC CHOLECYSTECTOMY  ~ 2000  . LAPAROSCOPIC GASTRIC BANDING  2011  . LAPAROSCOPIC REVISION VENTRICULAR-PERITONEAL (V-P) SHUNT N/A 06/23/2017   Procedure: Shunt Placment stereotactic ventricular catheter placement and general surgery abdominal exposure  (Laparoscopic vs. Open) Brain Lab;  Surgeon: Clovis Riley, MD;  Location: [facility];  Service: General;  Laterality: N/A;  . LAPAROSCOPY N/A 06/23/2017   Procedure:  LAPAROSCOPY DIAGNOSTIC;  Surgeon: Clovis Riley, MD;  Location: [facility];  Service: General;  Laterality: N/A;  . LUMBAR LAMINECTOMY/DECOMPRESSION MICRODISCECTOMY N/A 07/23/2016   Procedure: Decompressive Lumbar Laminectomy Lumbar two-three  for Repair of  Cerberal Spinal Fluid  Leak;  Surgeon: Kary Kos, MD;  Location: [facility];  Service: Neurosurgery;  Laterality: N/A;  . LUMBAR LAMINECTOMY/DECOMPRESSION MICRODISCECTOMY Left 03/01/2019   Procedure: LEFT LUMBAR THREE-FOUR EXTRAFORAMINAL MICRODISCECTOMY;  Surgeon: Kary Kos, MD;  Location: [facility];  Service: Neurosurgery;  Laterality: Left;  . PLACEMENT LUMBOPERITONEAL SHUNT FOR CEREBROSPINAL FLUID DIVERSION AWAY FROM C8 PERINEURAL CYST  09-05-2003  &  12-24-2003   08-31-2003  REMOVAL SHUNT  . POSTERIOR CERVICAL LAMINECTOMY  03-01-2001   C7-8 and Exploration C8 perineural cyst  . POSTERIOR LUMBAR LAMINECTOMY L4-5/  DISKECTOMY L5-S1  05-20-2005  . RE-DO DECOMPRESSION LAMINECTOMY AND FUSION L4-5  &  L5-S1  12-20-2006   02-04-2007--  RE-EXPLORATION L4 to S1, I & D LUMBAR WOUND HEMATOMA  . REDUCTION MAMMAPLASTY    . SHUNT REMOVAL N/A 07/10/2016   Procedure: SHUNT REMOVAL;  Surgeon: Kary Kos, MD;  Location: [facility];  Service: Neurosurgery;  Laterality: N/A;  SHUNT REMOVAL  . SPINAL CORD STIMULATOR INSERTION N/A 04/26/2015   Procedure: LUMBAR SPINAL CORD STIMULATOR INSERTION;  Surgeon: Clydell Hakim, MD;  Location: [facility];  Service: Neurosurgery;  Laterality: N/A;  LUMBAR SPINAL CORD STIMULATOR INSERTION  . SPINAL CORD STIMULATOR REMOVAL N/A 06/28/2019   Procedure: Removal of dorsal column stimulator and generator -;  Surgeon: Kary Kos, MD;  Location: [facility];  Service: Neurosurgery;  Laterality: N/A;  . TENNIS ELBOW RELEASE/NIRSCHEL PROCEDURE Left 12/13/2014   Procedure: LEFT ELBOW LATERAL EPICONDYLAR DEBRIDEMENT AND OR REPAIR -RECONSTRUCTION ;  Surgeon: Iran Planas, MD;  Location: [facility];  Service: Orthopedics;  Laterality: Left;  .  TOTAL ABDOMINAL HYSTERECTOMY  1994   w/  Bilateral Salpinoophorectomy  . VENTRICULOPERITONEAL SHUNT N/A 06/23/2017   Procedure: VENTRICULAR-PERITONEAL SHUNT INSERTION WITH ABDOMINAL LAPARASCOPY AND BRAINLAB NAVIGATION;  Surgeon: Kary Kos, MD;  Location: [facility];  Service: Neurosurgery;  Laterality: N/A;    Social History   Socioeconomic History  . Marital status: Legally Separated    Spouse name: [name]  . Number of children: 2  . Years of education: Assoc  . Highest education level: Associate degree: academic program  Occupational History  . Occupation: Disabled   Tobacco Use  . Smoking status: Never Smoker  . Smokeless tobacco: Never Used  Vaping Use  . Vaping Use: Never used  Substance and Sexual Activity  . Alcohol use: No  . Drug use: No  . Sexual activity: Not Currently    Birth control/protection: Surgical  Other Topics Concern  . Not on file  Social History Narrative  Caffeine: occasionally    Mrs Salahuddin is married, on permanent disability and lives at home with her husband, her support system. She is independent in her care needs. She denies concern with transportation to medical appointments She is on a fixed income   Social Determinants of Health   Financial Resource Strain:   . Difficulty of Paying Living Expenses: Not on file  Food Insecurity:   . Worried About Charity fundraiser in the Last Year: Not on file  . Ran Out of Food in the Last Year: Not on file  Transportation Needs:   . Lack of Transportation (Medical): Not on file  . Lack of Transportation (Non-Medical): Not on file  Physical Activity:   . Days of Exercise per Week: Not on file  . Minutes of Exercise per Session: Not on file  Stress:   . Feeling of Stress : Not on file  Social Connections:   . Frequency of Communication with Friends and Family: Not on file  . Frequency of Social Gatherings with Friends and Family: Not on file  . Attends Religious Services: Not on file  . Active Member of  Clubs or Organizations: Not on file  . Attends Archivist Meetings: Not on file  . Marital Status: Not on file    Family History  Problem Relation Age of Onset  . Depression Other   . Colon cancer Father   . Ovarian cancer Maternal Grandmother   . Pancreatic cancer Maternal Grandfather     Health Maintenance  Topic Date Due  . Hepatitis C Screening  Never done  . COLONOSCOPY  Never done  . COVID-19 Vaccine (2 - Moderna 2-dose series) 12/05/2019  . INFLUENZA VACCINE  12/17/2019  . TETANUS/TDAP  07/18/2020 (Originally 05/19/2019)  . MAMMOGRAM  07/07/2020  . HIV Screening  Completed     ----------------------------------------------------------------------------------------------------------------------------------------------------------------------------------------------------------------- Physical Exam BP 133/84 (BP Location: Left Arm, Patient Position: Sitting, Cuff Size: Normal)   Pulse 97   SpO2 99%   Physical Exam Constitutional:      Appearance: Normal appearance.  Cardiovascular:     Rate and Rhythm: Normal rate and regular rhythm.  Pulmonary:     Effort: Pulmonary effort is normal.     Breath sounds: Normal breath sounds.  Abdominal:     Tenderness: There is no right CVA tenderness or left CVA tenderness.     Comments: Suprapubic tenderness   Neurological:     General: No focal deficit present.     Mental Status: She is alert.     ------------------------------------------------------------------------------------------------------------------------------------------------------------------------------------------------------------------- Assessment and Plan  Cystitis Start macrobid 100mg  BID x5 days. Pyridium as needed for comfort.   Urine sent for culture.  Referral placed to urology as she has had recurrent UTI issues over the past few months.    Meds ordered this encounter  Medications  . nitrofurantoin, macrocrystal-monohydrate,  (MACROBID) 100 MG capsule    Sig: Take 1 capsule (100 mg total) by mouth 2 (two) times daily for 5 days.    Dispense:  10 capsule    Refill:  0  . phenazopyridine (PYRIDIUM) 200 MG tablet    Sig: Take 1 tablet (200 mg total) by mouth 3 (three) times daily as needed for pain.    Dispense:  10 tablet    Refill:  0    No follow-ups on file.    This visit occurred during the SARS-CoV-2 public health emergency.  Safety protocols were in place, including screening questions prior to the visit, additional  usage of staff PPE, and extensive cleaning of exam room while observing appropriate contact time as indicated for disinfecting solutions.   

## 2020-04-15 NOTE — Research (Signed)
Informed Consent   Subject Name: Katrina Fernandez  Subject met inclusion and exclusion criteria.  The informed consent form, study requirements and expectations were reviewed with the subject and questions and concerns were addressed prior to the signing of the consent form.  The subject verbalized understanding of the trial requirements.  The subject agreed to participate in the IDENTIFY trial and signed the informed consent at 1249 on 29-NOV-21.  The informed consent was obtained prior to performance of any protocol-specific procedures for the subject.  A copy of the signed informed consent was given to the subject and a copy was placed in the subject's medical record.   Sherlyn Lees

## 2020-04-15 NOTE — Assessment & Plan Note (Signed)
Start macrobid 100mg  BID x5 days. Pyridium as needed for comfort.   Urine sent for culture.  Referral placed to urology as she has had recurrent UTI issues over the past few months.

## 2020-04-17 LAB — URINE CULTURE
MICRO NUMBER:: 11257142
SPECIMEN QUALITY:: ADEQUATE

## 2020-04-18 ENCOUNTER — Encounter: Payer: Self-pay | Admitting: Family Medicine

## 2020-04-22 ENCOUNTER — Telehealth: Payer: Self-pay | Admitting: *Deleted

## 2020-04-22 DIAGNOSIS — R309 Painful micturition, unspecified: Secondary | ICD-10-CM

## 2020-04-22 DIAGNOSIS — N390 Urinary tract infection, site not specified: Secondary | ICD-10-CM

## 2020-04-22 NOTE — Telephone Encounter (Signed)
Labs ordered for UTI.

## 2020-04-22 NOTE — Telephone Encounter (Signed)
Previous culture with mixed flora.  Please have her stop by to collect another clean catch urine for UA and culture.  Thanks!

## 2020-04-23 ENCOUNTER — Ambulatory Visit (INDEPENDENT_AMBULATORY_CARE_PROVIDER_SITE_OTHER): Payer: Medicare HMO | Admitting: Psychology

## 2020-04-23 DIAGNOSIS — R309 Painful micturition, unspecified: Secondary | ICD-10-CM | POA: Diagnosis not present

## 2020-04-23 DIAGNOSIS — F4323 Adjustment disorder with mixed anxiety and depressed mood: Secondary | ICD-10-CM

## 2020-04-23 DIAGNOSIS — F411 Generalized anxiety disorder: Secondary | ICD-10-CM

## 2020-04-23 DIAGNOSIS — R69 Illness, unspecified: Secondary | ICD-10-CM | POA: Diagnosis not present

## 2020-04-23 DIAGNOSIS — N390 Urinary tract infection, site not specified: Secondary | ICD-10-CM | POA: Diagnosis not present

## 2020-04-24 ENCOUNTER — Other Ambulatory Visit: Payer: Self-pay | Admitting: Family Medicine

## 2020-04-24 MED ORDER — CIPROFLOXACIN HCL 500 MG PO TABS: 500.0000 mg | ORAL_TABLET | Freq: Two times a day (BID) | ORAL | 0 refills | Status: AC

## 2020-04-24 MED ORDER — CIPROFLOXACIN HCL 500 MG PO TABS: 500.0000 mg | ORAL_TABLET | Freq: Two times a day (BID) | ORAL | 0 refills | Status: DC

## 2020-04-24 NOTE — Telephone Encounter (Signed)
I'm going to send over cipro until culture returns. Continue pyridium as needed. Lets see if we can get urology appt moved up.  Thanks!

## 2020-04-24 NOTE — Telephone Encounter (Signed)
Katrina Fernandez can you see if you can get her scheduled sooner with Urology?

## 2020-04-26 LAB — URINE CULTURE
MICRO NUMBER:: 11287957
SPECIMEN QUALITY:: ADEQUATE

## 2020-04-26 LAB — URINALYSIS
Bilirubin Urine: NEGATIVE
Glucose, UA: NEGATIVE
Hgb urine dipstick: NEGATIVE
Ketones, ur: NEGATIVE
Nitrite: POSITIVE — AB
Protein, ur: NEGATIVE
Specific Gravity, Urine: 1.016 (ref 1.001–1.03)
pH: 5.5 (ref 5.0–8.0)

## 2020-04-30 ENCOUNTER — Ambulatory Visit: Payer: Medicare HMO | Admitting: Family Medicine

## 2020-04-30 ENCOUNTER — Encounter: Payer: Self-pay | Admitting: Family Medicine

## 2020-04-30 ENCOUNTER — Other Ambulatory Visit: Payer: Self-pay | Admitting: Family Medicine

## 2020-04-30 DIAGNOSIS — R300 Dysuria: Secondary | ICD-10-CM

## 2020-04-30 DIAGNOSIS — R1030 Lower abdominal pain, unspecified: Secondary | ICD-10-CM

## 2020-04-30 DIAGNOSIS — N390 Urinary tract infection, site not specified: Secondary | ICD-10-CM

## 2020-05-03 ENCOUNTER — Ambulatory Visit (INDEPENDENT_AMBULATORY_CARE_PROVIDER_SITE_OTHER): Payer: Medicare HMO

## 2020-05-03 ENCOUNTER — Other Ambulatory Visit: Payer: Self-pay

## 2020-05-03 DIAGNOSIS — Z981 Arthrodesis status: Secondary | ICD-10-CM | POA: Diagnosis not present

## 2020-05-03 DIAGNOSIS — R102 Pelvic and perineal pain: Secondary | ICD-10-CM | POA: Diagnosis not present

## 2020-05-03 DIAGNOSIS — N390 Urinary tract infection, site not specified: Secondary | ICD-10-CM

## 2020-05-03 DIAGNOSIS — R300 Dysuria: Secondary | ICD-10-CM

## 2020-05-03 DIAGNOSIS — R1030 Lower abdominal pain, unspecified: Secondary | ICD-10-CM

## 2020-05-03 DIAGNOSIS — N132 Hydronephrosis with renal and ureteral calculous obstruction: Secondary | ICD-10-CM | POA: Diagnosis not present

## 2020-05-03 DIAGNOSIS — N202 Calculus of kidney with calculus of ureter: Secondary | ICD-10-CM | POA: Diagnosis not present

## 2020-05-03 DIAGNOSIS — Z9049 Acquired absence of other specified parts of digestive tract: Secondary | ICD-10-CM | POA: Diagnosis not present

## 2020-05-06 ENCOUNTER — Other Ambulatory Visit: Payer: Self-pay | Admitting: Family Medicine

## 2020-05-06 ENCOUNTER — Encounter: Payer: Self-pay | Admitting: Family Medicine

## 2020-05-06 MED ORDER — TAMSULOSIN HCL 0.4 MG PO CAPS: 0.4000 mg | ORAL_CAPSULE | Freq: Every day | ORAL | 1 refills | Status: DC

## 2020-05-07 ENCOUNTER — Ambulatory Visit (INDEPENDENT_AMBULATORY_CARE_PROVIDER_SITE_OTHER): Payer: Medicare HMO | Admitting: Psychology

## 2020-05-07 DIAGNOSIS — F4323 Adjustment disorder with mixed anxiety and depressed mood: Secondary | ICD-10-CM

## 2020-05-07 DIAGNOSIS — F411 Generalized anxiety disorder: Secondary | ICD-10-CM

## 2020-05-07 DIAGNOSIS — R69 Illness, unspecified: Secondary | ICD-10-CM | POA: Diagnosis not present

## 2020-05-17 ENCOUNTER — Other Ambulatory Visit: Payer: Self-pay | Admitting: Family Medicine

## 2020-05-21 ENCOUNTER — Ambulatory Visit (INDEPENDENT_AMBULATORY_CARE_PROVIDER_SITE_OTHER): Payer: Medicare HMO | Admitting: Psychology

## 2020-05-21 DIAGNOSIS — F4323 Adjustment disorder with mixed anxiety and depressed mood: Secondary | ICD-10-CM

## 2020-05-21 DIAGNOSIS — F411 Generalized anxiety disorder: Secondary | ICD-10-CM | POA: Diagnosis not present

## 2020-05-21 DIAGNOSIS — R69 Illness, unspecified: Secondary | ICD-10-CM | POA: Diagnosis not present

## 2020-05-21 DIAGNOSIS — M5416 Radiculopathy, lumbar region: Secondary | ICD-10-CM | POA: Diagnosis not present

## 2020-05-21 DIAGNOSIS — Z6830 Body mass index (BMI) 30.0-30.9, adult: Secondary | ICD-10-CM | POA: Diagnosis not present

## 2020-05-21 DIAGNOSIS — R030 Elevated blood-pressure reading, without diagnosis of hypertension: Secondary | ICD-10-CM | POA: Diagnosis not present

## 2020-05-22 DIAGNOSIS — M961 Postlaminectomy syndrome, not elsewhere classified: Secondary | ICD-10-CM | POA: Diagnosis not present

## 2020-05-22 DIAGNOSIS — M79605 Pain in left leg: Secondary | ICD-10-CM | POA: Diagnosis not present

## 2020-05-22 DIAGNOSIS — G894 Chronic pain syndrome: Secondary | ICD-10-CM | POA: Diagnosis not present

## 2020-05-22 DIAGNOSIS — M79609 Pain in unspecified limb: Secondary | ICD-10-CM | POA: Diagnosis not present

## 2020-05-22 DIAGNOSIS — M7712 Lateral epicondylitis, left elbow: Secondary | ICD-10-CM | POA: Diagnosis not present

## 2020-05-22 DIAGNOSIS — M7061 Trochanteric bursitis, right hip: Secondary | ICD-10-CM | POA: Diagnosis not present

## 2020-05-22 DIAGNOSIS — R69 Illness, unspecified: Secondary | ICD-10-CM | POA: Diagnosis not present

## 2020-05-22 DIAGNOSIS — M542 Cervicalgia: Secondary | ICD-10-CM | POA: Diagnosis not present

## 2020-05-22 DIAGNOSIS — M5440 Lumbago with sciatica, unspecified side: Secondary | ICD-10-CM | POA: Diagnosis not present

## 2020-05-22 DIAGNOSIS — Z79899 Other long term (current) drug therapy: Secondary | ICD-10-CM | POA: Diagnosis not present

## 2020-05-28 ENCOUNTER — Other Ambulatory Visit: Payer: Self-pay | Admitting: Family Medicine

## 2020-05-31 DIAGNOSIS — N202 Calculus of kidney with calculus of ureter: Secondary | ICD-10-CM | POA: Diagnosis not present

## 2020-05-31 DIAGNOSIS — Z87440 Personal history of urinary (tract) infections: Secondary | ICD-10-CM | POA: Diagnosis not present

## 2020-06-04 ENCOUNTER — Ambulatory Visit (INDEPENDENT_AMBULATORY_CARE_PROVIDER_SITE_OTHER): Payer: Medicare HMO | Admitting: Psychology

## 2020-06-04 DIAGNOSIS — F4323 Adjustment disorder with mixed anxiety and depressed mood: Secondary | ICD-10-CM | POA: Diagnosis not present

## 2020-06-04 DIAGNOSIS — F411 Generalized anxiety disorder: Secondary | ICD-10-CM

## 2020-06-04 DIAGNOSIS — R69 Illness, unspecified: Secondary | ICD-10-CM | POA: Diagnosis not present

## 2020-06-05 ENCOUNTER — Other Ambulatory Visit: Payer: Self-pay | Admitting: Family Medicine

## 2020-06-07 ENCOUNTER — Other Ambulatory Visit: Payer: Self-pay | Admitting: Family Medicine

## 2020-06-18 ENCOUNTER — Ambulatory Visit: Payer: Medicare HMO | Admitting: Psychology

## 2020-06-19 DIAGNOSIS — M5416 Radiculopathy, lumbar region: Secondary | ICD-10-CM | POA: Diagnosis not present

## 2020-06-19 DIAGNOSIS — M545 Low back pain, unspecified: Secondary | ICD-10-CM | POA: Diagnosis not present

## 2020-06-25 DIAGNOSIS — M5440 Lumbago with sciatica, unspecified side: Secondary | ICD-10-CM | POA: Diagnosis not present

## 2020-06-25 DIAGNOSIS — Z6831 Body mass index (BMI) 31.0-31.9, adult: Secondary | ICD-10-CM | POA: Diagnosis not present

## 2020-06-25 DIAGNOSIS — R519 Headache, unspecified: Secondary | ICD-10-CM | POA: Diagnosis not present

## 2020-06-25 DIAGNOSIS — R030 Elevated blood-pressure reading, without diagnosis of hypertension: Secondary | ICD-10-CM | POA: Diagnosis not present

## 2020-07-02 ENCOUNTER — Ambulatory Visit (INDEPENDENT_AMBULATORY_CARE_PROVIDER_SITE_OTHER): Payer: Medicare HMO | Admitting: Psychology

## 2020-07-02 DIAGNOSIS — F411 Generalized anxiety disorder: Secondary | ICD-10-CM

## 2020-07-02 DIAGNOSIS — F4323 Adjustment disorder with mixed anxiety and depressed mood: Secondary | ICD-10-CM

## 2020-07-02 DIAGNOSIS — R69 Illness, unspecified: Secondary | ICD-10-CM | POA: Diagnosis not present

## 2020-07-16 ENCOUNTER — Ambulatory Visit: Payer: Medicare HMO | Admitting: Psychology

## 2020-07-17 DIAGNOSIS — M7712 Lateral epicondylitis, left elbow: Secondary | ICD-10-CM | POA: Diagnosis not present

## 2020-07-17 DIAGNOSIS — Z5181 Encounter for therapeutic drug level monitoring: Secondary | ICD-10-CM | POA: Diagnosis not present

## 2020-07-17 DIAGNOSIS — M7061 Trochanteric bursitis, right hip: Secondary | ICD-10-CM | POA: Diagnosis not present

## 2020-07-17 DIAGNOSIS — M79609 Pain in unspecified limb: Secondary | ICD-10-CM | POA: Diagnosis not present

## 2020-07-17 DIAGNOSIS — M79605 Pain in left leg: Secondary | ICD-10-CM | POA: Diagnosis not present

## 2020-07-17 DIAGNOSIS — G894 Chronic pain syndrome: Secondary | ICD-10-CM | POA: Diagnosis not present

## 2020-07-17 DIAGNOSIS — Z79899 Other long term (current) drug therapy: Secondary | ICD-10-CM | POA: Diagnosis not present

## 2020-07-17 DIAGNOSIS — R69 Illness, unspecified: Secondary | ICD-10-CM | POA: Diagnosis not present

## 2020-07-17 DIAGNOSIS — M542 Cervicalgia: Secondary | ICD-10-CM | POA: Diagnosis not present

## 2020-07-17 DIAGNOSIS — M5440 Lumbago with sciatica, unspecified side: Secondary | ICD-10-CM | POA: Diagnosis not present

## 2020-07-17 DIAGNOSIS — M961 Postlaminectomy syndrome, not elsewhere classified: Secondary | ICD-10-CM | POA: Diagnosis not present

## 2020-07-24 ENCOUNTER — Ambulatory Visit (INDEPENDENT_AMBULATORY_CARE_PROVIDER_SITE_OTHER): Payer: Medicare HMO | Admitting: Family Medicine

## 2020-07-24 DIAGNOSIS — Z1231 Encounter for screening mammogram for malignant neoplasm of breast: Secondary | ICD-10-CM | POA: Diagnosis not present

## 2020-07-24 DIAGNOSIS — Z0000 Encounter for general adult medical examination without abnormal findings: Secondary | ICD-10-CM

## 2020-07-24 NOTE — Patient Instructions (Addendum)
  [facility] Maintenance Summary and Written Plan of Care  Katrina Fernandez ,  Thank you for allowing me to perform your Medicare Annual Wellness Visit and for your ongoing commitment to your health.   Health Maintenance & Immunization History Health Maintenance  Topic Date Due  . COVID-19 Vaccine (2 - Moderna 3-dose series) 08/09/2020 (Originally 12/05/2019)  . INFLUENZA VACCINE  08/15/2020 (Originally 12/17/2019)  . MAMMOGRAM  07/24/2021 (Originally 07/07/2020)  . TETANUS/TDAP  07/24/2021 (Originally 05/19/2019)  . Hepatitis C Screening  07/24/2021 (Originally 11/05/1965)  . COLONOSCOPY (Pts 45-43yrs Insurance coverage will need to be confirmed)  07/05/2023  . HIV Screening  Completed  . HPV VACCINES  Aged Out   Immunization History  Administered Date(s) Administered  . Influenza Split 02/15/2013  . Influenza,inj,Quad PF,6+ Mos 01/13/2018, 01/10/2019  . Influenza-Unspecified 02/10/2015, 02/11/2017  . Moderna Sars-Covid-2 Vaccination 11/07/2019  . PPD Test 10/17/2014  . Tdap 05/18/2009    These are the patient goals that we discussed: Goals Addressed              This Visit's Progress   .  Patient Stated (pt-stated)        07/24/2020 AWV Goal: Improved Nutrition/Diet  . Patient will verbalize understanding that diet plays an important role in overall health and that a poor diet is a risk factor for many chronic medical conditions.  . Over the next year, patient will improve self management of their diet by incorporating water. . Patient will utilize available community resources to help with food acquisition if needed (ex: food pantries, [address], etc) . Patient will work with nutrition specialist if a referral was made         This is a list of Health Maintenance Items that are overdue or due now: There are no preventive care reminders to display for this patient.   Orders/Referrals Placed Today: Orders Placed This Encounter  Procedures  .  Mammogram 3D SCREEN BREAST BILATERAL    Standing Status:   Future    Standing Expiration Date:   07/24/2021    Scheduling Instructions:     Please call patient to schedule    Order Specific Question:   Reason for Exam (SYMPTOM  OR DIAGNOSIS REQUIRED)    Answer:   breast cancer screen    Order Specific Question:   Is the patient pregnant?    Answer:   No    Order Specific Question:   Preferred imaging location?    Answer:   [facility]    Order Specific Question:   Release to patient    Answer:   Immediate   (Contact our referral department at [phone] if you have not spoken with someone about your referral appointment within the next 5 days)    Follow-up Plan . Follow-up with Luetta Nutting, DO as planned . Schedule your tetanus shot at your pharmacy.  . Mammogram referral has been sent.  . Medicare wellness in one year.

## 2020-07-24 NOTE — Progress Notes (Signed)
MEDICARE ANNUAL WELLNESS VISIT  07/24/2020  Telephone Visit Disclaimer This Medicare AWV was conducted by telephone due to national recommendations for restrictions regarding the COVID-19 Pandemic (e.g. social distancing).  I verified, using two identifiers, that I am speaking with Katrina Fernandez or their authorized healthcare agent. I discussed the limitations, risks, security, and privacy concerns of performing an evaluation and management service by telephone and the potential availability of an in-person appointment in the future. The patient expressed understanding and agreed to proceed.  Location of Patient: Home Location of Provider (nurse):  In the office.  Subjective:    Katrina Fernandez is a 55 y.o. female patient of Luetta Nutting, DO who had a Medicare Annual Wellness Visit today via telephone. Katrina Fernandez is Disabled and lives with her mother and her daughter. she has 2 children. she reports that she is socially active and does interact with friends/family regularly. she is minimally physically active and enjoys quilting.  Patient Care Team: Luetta Nutting, DO as PCP - General (Family Medicine) Dr. Damien Fusi (Pain Medicine) Kary Kos, MD as Consulting Physician (Neurosurgery)  Advanced Directives 07/24/2020 06/28/2019 06/26/2019 02/27/2019 11/09/2018 06/21/2017 07/23/2016  Does Patient Have a Medical Advance Directive? No No No No No No No  Would patient like information on creating a medical advance directive? No - Patient declined No - Guardian declined - No - Patient declined No - Patient declined No - Patient declined No - Patient declined    Hospital Utilization Over the Past 12 Months: # of hospitalizations or ER visits: 0 # of surgeries: 0  Review of Systems    Patient reports that her overall health is unchanged compared to last year.  History obtained from chart review and the patient  Patient Reported Readings (BP, Pulse, CBG, Weight, etc) none  Pain Assessment Pain  : 0-10 Pain Score: 8  Pain Type: Chronic pain Pain Location: Back Pain Orientation: Lower Pain Radiating Towards: hips and legs. Pain Descriptors / Indicators: Aching,Burning Pain Onset: More than a month ago Pain Frequency: Constant Pain Relieving Factors: ICE, pain medication somtimes; she is also going for an epidural shot with her neurosurgeon. Effect of Pain on Daily Activities: yes, a lot  Pain Relieving Factors: ICE, pain medication somtimes; she is also going for an epidural shot with her neurosurgeon.  Current Medications & Allergies (verified) Allergies as of 07/24/2020      Reactions   Penicillins Anaphylaxis   Has patient had a PCN reaction causing immediate rash, facial/tongue/throat swelling, SOB or lightheadedness with hypotension: Yes Has patient had a PCN reaction causing severe rash involving mucus membranes or skin necrosis: No Has patient had a PCN reaction that required hospitalization No Has patient had a PCN reaction occurring within the last 10 years: No If all of the above answers are "NO", then may proceed with Cephalosporin use.   Tape Rash   Plastic tape, silicone tape, Steri Strips   Lactose Nausea And Vomiting, Swelling       Chlorhexidine Rash, Other (See Comments)   Dermatitis - Allergic      Medication List       Accurate as of July 24, 2020  9:50 AM. If you have any questions, ask your nurse or doctor.        celecoxib 200 MG capsule Commonly known as: CELEBREX Take 200 mg by mouth daily.   diclofenac sodium 1 % Gel Commonly known as: VOLTAREN Apply 4 g topically 4 (four) times daily. To affected joint.  DULoxetine 60 MG capsule Commonly known as: CYMBALTA TAKE 2 CAPSULES BY MOUTH EVERY DAY   methadone 5 MG tablet Commonly known as: DOLOPHINE Take 2.5 mg by mouth daily.   methocarbamol 500 MG tablet Commonly known as: ROBAXIN Take 500 mg by mouth every 6 (six) hours as needed for muscle spasms.   metoprolol tartrate 100 MG  tablet Commonly known as: LOPRESSOR Take 2 hours prior to CT scan   montelukast 10 MG tablet Commonly known as: SINGULAIR TAKE 1 TABLET BY MOUTH EVERYDAY AT BEDTIME   Narcan 4 MG/0.1ML Liqd nasal spray kit Generic drug: naloxone Place 0.4 mg into the nose once.   Ozempic (1 MG/DOSE) 2 MG/1.5ML Sopn Generic drug: Semaglutide (1 MG/DOSE) INJECT 1 MG INTO THE SKIN ONCE A WEEK.   phenazopyridine 200 MG tablet Commonly known as: Pyridium Take 1 tablet (200 mg total) by mouth 3 (three) times daily as needed for pain.   polyethylene glycol 17 g packet Commonly known as: MIRALAX / GLYCOLAX Take 17 g by mouth daily as needed for mild constipation or moderate constipation.   pramipexole 0.5 MG tablet Commonly known as: MIRAPEX TAKE 1 TABLET BY MOUTH AT BEDTIME.   simvastatin 20 MG tablet Commonly known as: ZOCOR TAKE 1 TABLET BY MOUTH EVERY DAY   tamsulosin 0.4 MG Caps capsule Commonly known as: FLOMAX TAKE 1 CAPSULE BY MOUTH EVERY DAY   traMADol 50 MG tablet Commonly known as: ULTRAM Take 50-100 mg by mouth every 8 (eight) hours as needed (Pain).       History (reviewed): Past Medical History:  Diagnosis Date  . Anemia   . Anxiety   . Arthritis    "back and knees" (07/23/2016)  . Chiari malformation type I ([place]) dx'd 2014  . Chronic back pain    buldging, DDD; "neck and lower back" (07/23/2016)  . Chronic constipation    takes Miralax daily  . Chronic pain syndrome    takes Methadone and Keppra daily  . Depression    takes Effexor daily  . Endometriosis    1995  . Fibromyalgia   . GERD (gastroesophageal reflux disease)    not taking anything at the present time  . History of DVT of lower extremity 07/2012   LEFT LEG --  3/ 2014.Was on a blood transfusion for 6 months  . History of kidney stones   . History of MRSA infection 10 yrs ago  . Hyperlipidemia    takes Simvastatin daily  . Migraine    "daily recently" (07/23/2016)  . Muscle spasm    takes Zanaflex  nightly  . Peripheral edema    takes Lasix daily as needed  . Peripheral neuropathy   . Pleurisy 2000  . Pneumonia    hx of-7-8 yrs ago  . PONV (postoperative nausea and vomiting)   . Seasonal allergies    takes Claritin daily  . Spinal headache    "since 2002; still get them; try to manage it w/lying flat"   Past Surgical History:  Procedure Laterality Date  . ANTERIOR CERVICAL DECOMP/DISCECTOMY FUSION  07-25-2009   C5 -- C6  . APPENDECTOMY  age 44  . APPLICATION OF CRANIAL NAVIGATION N/A 06/23/2017   Procedure: APPLICATION OF CRANIAL NAVIGATION;  Surgeon: Kary Kos, MD;  Location: [facility];  Service: Neurosurgery;  Laterality: N/A;  . BACK SURGERY    . BRAIN SURGERY     VP shunt  . BREAST REDUCTION SURGERY Bilateral 06/07/2013   Procedure: BILATERAL MAMMARY REDUCTION  (BREAST);  Surgeon: Theodoro Kos, DO;  Location: [facility];  Service: Plastics;  Laterality: Bilateral;  . BREAST REDUCTION SURGERY Bilateral 06/07/2013   Procedure:  LIPOSUCTION;  Surgeon: Theodoro Kos, DO;  Location: [facility];  Service: Plastics;  Laterality: Bilateral;  . CARPAL TUNNEL RELEASE Bilateral 2001  . COLONOSCOPY  X 2   "polyps were benign"  . DILATION AND CURETTAGE OF UTERUS    . ESOPHAGOGASTRODUODENOSCOPY    . gastric banding undone  11/2014  . LAPAROSCOPIC CHOLECYSTECTOMY  ~ 2000  . LAPAROSCOPIC GASTRIC BANDING  2011  . LAPAROSCOPIC REVISION VENTRICULAR-PERITONEAL (V-P) SHUNT N/A 06/23/2017   Procedure: Shunt Placment stereotactic ventricular catheter placement and general surgery abdominal exposure  (Laparoscopic vs. Open) Brain Lab;  Surgeon: Clovis Riley, MD;  Location: [facility];  Service: General;  Laterality: N/A;  . LAPAROSCOPY N/A 06/23/2017   Procedure: LAPAROSCOPY DIAGNOSTIC;  Surgeon: Clovis Riley, MD;  Location: [facility];  Service: General;  Laterality: N/A;  . LUMBAR LAMINECTOMY/DECOMPRESSION MICRODISCECTOMY N/A 07/23/2016   Procedure: Decompressive Lumbar  Laminectomy Lumbar two-three  for Repair of  Cerberal Spinal Fluid  Leak;  Surgeon: Kary Kos, MD;  Location: [facility];  Service: Neurosurgery;  Laterality: N/A;  . LUMBAR LAMINECTOMY/DECOMPRESSION MICRODISCECTOMY Left 03/01/2019   Procedure: LEFT LUMBAR THREE-FOUR EXTRAFORAMINAL MICRODISCECTOMY;  Surgeon: Kary Kos, MD;  Location: [facility];  Service: Neurosurgery;  Laterality: Left;  . PLACEMENT LUMBOPERITONEAL SHUNT FOR CEREBROSPINAL FLUID DIVERSION AWAY FROM C8 PERINEURAL CYST  09-05-2003  &  12-24-2003   08-31-2003  REMOVAL SHUNT  . POSTERIOR CERVICAL LAMINECTOMY  03-01-2001   C7-8 and Exploration C8 perineural cyst  . POSTERIOR LUMBAR LAMINECTOMY L4-5/  DISKECTOMY L5-S1  05-20-2005  . RE-DO DECOMPRESSION LAMINECTOMY AND FUSION L4-5  &  L5-S1  12-20-2006   02-04-2007--  RE-EXPLORATION L4 to S1, I & D LUMBAR WOUND HEMATOMA  . REDUCTION MAMMAPLASTY    . SHUNT REMOVAL N/A 07/10/2016   Procedure: SHUNT REMOVAL;  Surgeon: Kary Kos, MD;  Location: [facility];  Service: Neurosurgery;  Laterality: N/A;  SHUNT REMOVAL  . SPINAL CORD STIMULATOR INSERTION N/A 04/26/2015   Procedure: LUMBAR SPINAL CORD STIMULATOR INSERTION;  Surgeon: Clydell Hakim, MD;  Location: [facility];  Service: Neurosurgery;  Laterality: N/A;  LUMBAR SPINAL CORD STIMULATOR INSERTION  . SPINAL CORD STIMULATOR REMOVAL N/A 06/28/2019   Procedure: Removal of dorsal column stimulator and generator -;  Surgeon: Kary Kos, MD;  Location: [facility];  Service: Neurosurgery;  Laterality: N/A;  . TENNIS ELBOW RELEASE/NIRSCHEL PROCEDURE Left 12/13/2014   Procedure: LEFT ELBOW LATERAL EPICONDYLAR DEBRIDEMENT AND OR REPAIR -RECONSTRUCTION ;  Surgeon: Iran Planas, MD;  Location: [facility];  Service: Orthopedics;  Laterality: Left;  . TOTAL ABDOMINAL HYSTERECTOMY  1994   w/  Bilateral Salpinoophorectomy  . VENTRICULOPERITONEAL SHUNT N/A 06/23/2017   Procedure: VENTRICULAR-PERITONEAL SHUNT INSERTION WITH ABDOMINAL LAPARASCOPY AND BRAINLAB  NAVIGATION;  Surgeon: Kary Kos, MD;  Location: [facility];  Service: Neurosurgery;  Laterality: N/A;   Family History  Problem Relation Age of Onset  . Depression Other   . Colon cancer Father   . Ovarian cancer Maternal Grandmother   . Pancreatic cancer Maternal Grandfather    Social History   Socioeconomic History  . Marital status: Divorced    Spouse name: Not on file  . Number of children: 2  . Years of education: Assoc  . Highest education level: Associate degree: academic program  Occupational History  . Occupation: Disabled   Tobacco Use  .  Smoking status: Never Smoker  . Smokeless tobacco: Never Used  Vaping Use  . Vaping Use: Never used  Substance and Sexual Activity  . Alcohol use: Yes    Comment: once a year  . Drug use: No  . Sexual activity: Not Currently    Birth control/protection: Surgical  Other Topics Concern  . Not on file  Social History Narrative   Caffeine: occasionally    Katrina Fernandez is divorced, on permanent disability and lives at home with her mother and her daughter. She is independent in her care needs. She denies concern with transportation to medical appointments She is on a fixed income   Social Determinants of Health   Financial Resource Strain: Low Risk   . Difficulty of Paying Living Expenses: Not very hard  Food Insecurity: Food Insecurity Present  . Worried About Charity fundraiser in the Last Year: Sometimes true  . Ran Out of Food in the Last Year: Never true  Transportation Needs: No Transportation Needs  . Lack of Transportation (Medical): No  . Lack of Transportation (Non-Medical): No  Physical Activity: Inactive  . Days of Exercise per Week: 0 days  . Minutes of Exercise per Session: 0 min  Stress: Stress Concern Present  . Feeling of Stress : To some extent  Social Connections: Socially Isolated  . Frequency of Communication with Friends and Family: More than three times a week  . Frequency of Social Gatherings with  Friends and Family: Once a week  . Attends Religious Services: Never  . Active Member of Clubs or Organizations: No  . Attends Archivist Meetings: Never  . Marital Status: Divorced    Activities of Daily Living In your present state of health, do you have any difficulty performing the following activities: 07/24/2020  Hearing? N  Vision? N  Difficulty concentrating or making decisions? N  Walking or climbing stairs? N  Dressing or bathing? N  Doing errands, shopping? N  Preparing Food and eating ? N  Using the Toilet? N  In the past six months, have you accidently leaked urine? N  Do you have problems with loss of bowel control? N  Managing your Medications? N  Managing your Finances? N  Housekeeping or managing your Housekeeping? Y  Comment Patient is unable to do any housekeeping due to her chronic back pain.  Some recent data might be hidden    Patient Education/ Literacy How often do you need to have someone help you when you read instructions, pamphlets, or other written materials from your doctor or pharmacy?: 1 - Never What is the last grade level you completed in school?: Associates Degree  Exercise Current Exercise Habits: The patient does not participate in regular exercise at present, Exercise limited by: orthopedic condition(s);neurologic condition(s)  Diet Patient reports consuming 2 meals a day and 2-3 snack(s) a day Patient reports that her primary diet is: Regular Patient reports that she does have regular access to food.   Depression Screen PHQ 2/9 Scores 07/24/2020 07/19/2019 11/09/2018 11/09/2018 10/11/2018 09/27/2018 01/13/2018  PHQ - 2 Score 0 - 0 0 '2 4 3  ' PHQ- 9 Score - - - - '6 7 9  ' Exception Documentation - Patient refusal - - - - -     Fall Risk Fall Risk  07/24/2020 06/27/2018 01/13/2018 03/25/2016 02/20/2016  Falls in the past year? 0 0 No No No  Number falls in past yr: 0 0 - - -  Injury with Fall? 0 0 - - -  Risk for fall due to : No Fall Risks  - - - -  Follow up Falls evaluation completed Falls evaluation completed - - -     Objective:  Katrina Fernandez seemed alert and oriented and she participated appropriately during our telephone visit.  Blood Pressure Weight BMI  BP Readings from Last 3 Encounters:  04/15/20 103/68  04/15/20 133/84  02/22/20 123/82   Wt Readings from Last 3 Encounters:  02/22/20 175 lb (79.4 kg)  02/14/20 176 lb 1.9 oz (79.9 kg)  02/13/20 176 lb 12.8 oz (80.2 kg)   BMI Readings from Last 1 Encounters:  02/22/20 31.01 kg/m    *Unable to obtain current vital signs, weight, and BMI due to telephone visit type  Hearing/Vision  . Katrina Fernandez did not seem to have difficulty with hearing/understanding during the telephone conversation . Reports that she has not had a formal eye exam by an eye care professional within the past year . Reports that she has not had a formal hearing evaluation within the past year *Unable to fully assess hearing and vision during telephone visit type  Cognitive Function: 6CIT Screen 07/24/2020  What Year? 0 points  What month? 0 points  What time? 0 points  Count back from 20 0 points  Months in reverse 0 points  Repeat phrase 0 points  Total Score 0   (Normal:0-7, Significant for Dysfunction: >8)  Normal Cognitive Function Screening: Yes   Immunization & Health Maintenance Record Immunization History  Administered Date(s) Administered  . Influenza Split 02/15/2013  . Influenza,inj,Quad PF,6+ Mos 01/13/2018, 01/10/2019  . Influenza-Unspecified 02/10/2015, 02/11/2017  . Moderna Sars-Covid-2 Vaccination 11/07/2019  . PPD Test 10/17/2014  . Tdap 05/18/2009    Health Maintenance  Topic Date Due  . COVID-19 Vaccine (2 - Moderna 3-dose series) 08/09/2020 (Originally 12/05/2019)  . INFLUENZA VACCINE  08/15/2020 (Originally 12/17/2019)  . MAMMOGRAM  07/24/2021 (Originally 07/07/2020)  . TETANUS/TDAP  07/24/2021 (Originally 05/19/2019)  . Hepatitis C Screening  07/24/2021  (Originally [birth year])  . COLONOSCOPY (Pts 45-66yr Insurance coverage will need to be confirmed)  07/05/2023  . HIV Screening  Completed  . HPV VACCINES  Aged Out       Assessment  This is a routine wellness examination for Katrina Fernandez  Health Maintenance: Due or Overdue There are no preventive care reminders to display for this patient.  BKerby Moorsdoes not need a referral for CCommercial Metals CompanyAssistance: Care Management:   no Social Work:    no Prescription Assistance:  no Nutrition/Diabetes Education:  no   Plan:  Personalized Goals Goals Addressed              This Visit's Progress   .  Patient Stated (pt-stated)        07/24/2020 AWV Goal: Improved Nutrition/Diet  . Patient will verbalize understanding that diet plays an important role in overall health and that a poor diet is a risk factor for many chronic medical conditions.  . Over the next year, patient will improve self management of their diet by incorporating water. . Patient will utilize available community resources to help with food acquisition if needed (ex: food pantries, [address], etc) . Patient will work with nutrition specialist if a referral was made       Personalized Health Maintenance & Screening Recommendations  Influenza vaccine Td vaccine Screening mammography  Lung Cancer Screening Recommended: no (Low Dose CT Chest recommended if Age 53-80years, 30 pack-year currently smoking OR have quit w/in  past 15 years) Hepatitis C Screening recommended: yes HIV Screening recommended: yes  Advanced Directives: Written information was not prepared per patient's request.  Referrals & Orders Orders Placed This Encounter  Procedures  . Mammogram 3D SCREEN BREAST BILATERAL    Follow-up Plan . Follow-up with Luetta Nutting, DO as planned . Schedule your tetanus shot at your pharmacy.  . Mammogram referral has been sent.  . Medicare wellness in one year.   I have personally reviewed and  noted the following in the patient's chart:   . Medical and social history . Use of alcohol, tobacco or illicit drugs  . Current medications and supplements . Functional ability and status . Nutritional status . Physical activity . Advanced directives . List of other physicians . Hospitalizations, surgeries, and ER visits in previous 12 months . Vitals . Screenings to include cognitive, depression, and falls . Referrals and appointments  In addition, I have reviewed and discussed with Katrina Fernandez certain preventive protocols, quality metrics, and best practice recommendations. A written personalized care plan for preventive services as well as general preventive health recommendations is available and can be mailed to the patient at her request.      Tinnie Gens, RN  07/24/2020

## 2020-07-26 ENCOUNTER — Other Ambulatory Visit: Payer: Self-pay

## 2020-07-26 ENCOUNTER — Ambulatory Visit (INDEPENDENT_AMBULATORY_CARE_PROVIDER_SITE_OTHER): Payer: Medicare HMO

## 2020-07-26 DIAGNOSIS — Z1231 Encounter for screening mammogram for malignant neoplasm of breast: Secondary | ICD-10-CM | POA: Diagnosis not present

## 2020-07-26 DIAGNOSIS — Z0000 Encounter for general adult medical examination without abnormal findings: Secondary | ICD-10-CM

## 2020-07-28 ENCOUNTER — Other Ambulatory Visit: Payer: Self-pay | Admitting: Family Medicine

## 2020-07-30 ENCOUNTER — Ambulatory Visit (INDEPENDENT_AMBULATORY_CARE_PROVIDER_SITE_OTHER): Payer: Medicare HMO | Admitting: Psychology

## 2020-07-30 DIAGNOSIS — R69 Illness, unspecified: Secondary | ICD-10-CM | POA: Diagnosis not present

## 2020-07-30 DIAGNOSIS — F411 Generalized anxiety disorder: Secondary | ICD-10-CM

## 2020-07-30 DIAGNOSIS — F4323 Adjustment disorder with mixed anxiety and depressed mood: Secondary | ICD-10-CM

## 2020-08-13 ENCOUNTER — Ambulatory Visit (INDEPENDENT_AMBULATORY_CARE_PROVIDER_SITE_OTHER): Payer: Medicare HMO | Admitting: Psychology

## 2020-08-13 DIAGNOSIS — F4323 Adjustment disorder with mixed anxiety and depressed mood: Secondary | ICD-10-CM

## 2020-08-13 DIAGNOSIS — M5417 Radiculopathy, lumbosacral region: Secondary | ICD-10-CM | POA: Diagnosis not present

## 2020-08-13 DIAGNOSIS — F411 Generalized anxiety disorder: Secondary | ICD-10-CM | POA: Diagnosis not present

## 2020-08-13 DIAGNOSIS — R69 Illness, unspecified: Secondary | ICD-10-CM | POA: Diagnosis not present

## 2020-08-21 ENCOUNTER — Telehealth (INDEPENDENT_AMBULATORY_CARE_PROVIDER_SITE_OTHER): Payer: Medicare HMO | Admitting: Family Medicine

## 2020-08-21 ENCOUNTER — Encounter: Payer: Self-pay | Admitting: Family Medicine

## 2020-08-21 DIAGNOSIS — J069 Acute upper respiratory infection, unspecified: Secondary | ICD-10-CM | POA: Insufficient documentation

## 2020-08-21 NOTE — Progress Notes (Signed)
Symptoms started on Saturday. Nasal pressure Facial congestion  Meds: has not taken anything OTC.

## 2020-08-21 NOTE — Progress Notes (Signed)
Katrina Fernandez - 55 y.o. female MRN [id number]  Date of birth: 05/06/1966   This visit type was conducted due to national recommendations for restrictions regarding the COVID-19 Pandemic (e.g. social distancing).  This format is felt to be most appropriate for this patient at this time.  All issues noted in this document were discussed and addressed.  No physical exam was performed (except for noted visual exam findings with Video Visits).  I discussed the limitations of evaluation and management by telemedicine and the availability of in person appointments. The patient expressed understanding and agreed to proceed.  I connected with@ on 08/21/20 at 10:50 AM EDT by a video enabled telemedicine application and verified that I am speaking with the correct person using two identifiers.  Present at visit: Luetta Nutting, DO [name]   Patient Location: [place] [address]   Provider location:   [facility]  Chief Complaint  Patient presents with  . Facial Pain    HPI  Katrina Fernandez is a 55 y.o. female who presents via audio/video conferencing for a telehealth visit today.  She has complaint today of nasal congestion, sinus pressure, sneezing, and eye puffiness.  Mucus is clear.  Denies severe sinus pain.  Symptoms started about 4-5 days ago.  She did have some GI upset at initial onset.  She denies increased headaches, fever, chills, body aches.  Home COVID antigen test was negative.  She has not tried anything OTC.  She has had problems with allergies in the past and takes singulair.     ROS:  A comprehensive ROS was completed and negative except as noted per HPI  Past Medical History:  Diagnosis Date  . Anemia   . Anxiety   . Arthritis    "back and knees" (07/23/2016)  . Chiari malformation type I ([place]) dx'd 2014  . Chronic back pain    buldging, DDD; "neck and lower back" (07/23/2016)  . Chronic constipation    takes Miralax daily  . Chronic pain  syndrome    takes Methadone and Keppra daily  . Depression    takes Effexor daily  . Endometriosis    1995  . Fibromyalgia   . GERD (gastroesophageal reflux disease)    not taking anything at the present time  . History of DVT of lower extremity 07/2012   LEFT LEG --  3/ 2014.Was on a blood transfusion for 6 months  . History of kidney stones   . History of MRSA infection 10 yrs ago  . Hyperlipidemia    takes Simvastatin daily  . Migraine    "daily recently" (07/23/2016)  . Muscle spasm    takes Zanaflex nightly  . Peripheral edema    takes Lasix daily as needed  . Peripheral neuropathy   . Pleurisy 2000  . Pneumonia    hx of-7-8 yrs ago  . PONV (postoperative nausea and vomiting)   . Seasonal allergies    takes Claritin daily  . Spinal headache    "since 2002; still get them; try to manage it w/lying flat"    Past Surgical History:  Procedure Laterality Date  . ANTERIOR CERVICAL DECOMP/DISCECTOMY FUSION  07-25-2009   C5 -- C6  . APPENDECTOMY  age 60  . APPLICATION OF CRANIAL NAVIGATION N/A 06/23/2017   Procedure: APPLICATION OF CRANIAL NAVIGATION;  Surgeon: Kary Kos, MD;  Location: [place];  Service: Neurosurgery;  Laterality: N/A;  . BACK SURGERY    . BRAIN SURGERY  VP shunt  . BREAST REDUCTION SURGERY Bilateral 06/07/2013   Procedure: BILATERAL MAMMARY REDUCTION  (BREAST);  Surgeon: Theodoro Kos, DO;  Location: [place];  Service: Plastics;  Laterality: Bilateral;  . BREAST REDUCTION SURGERY Bilateral 06/07/2013   Procedure:  LIPOSUCTION;  Surgeon: Theodoro Kos, DO;  Location: [place];  Service: Plastics;  Laterality: Bilateral;  . CARPAL TUNNEL RELEASE Bilateral 2001  . COLONOSCOPY  X 2   "polyps were benign"  . DILATION AND CURETTAGE OF UTERUS    . ESOPHAGOGASTRODUODENOSCOPY    . gastric banding undone  11/2014  . LAPAROSCOPIC CHOLECYSTECTOMY  ~ 2000  . LAPAROSCOPIC GASTRIC BANDING  2011  . LAPAROSCOPIC REVISION  VENTRICULAR-PERITONEAL (V-P) SHUNT N/A 06/23/2017   Procedure: Shunt Placment stereotactic ventricular catheter placement and general surgery abdominal exposure  (Laparoscopic vs. Open) Brain Lab;  Surgeon: Clovis Riley, MD;  Location: [place];  Service: General;  Laterality: N/A;  . LAPAROSCOPY N/A 06/23/2017   Procedure: LAPAROSCOPY DIAGNOSTIC;  Surgeon: Clovis Riley, MD;  Location: [place];  Service: General;  Laterality: N/A;  . LUMBAR LAMINECTOMY/DECOMPRESSION MICRODISCECTOMY N/A 07/23/2016   Procedure: Decompressive Lumbar Laminectomy Lumbar two-three  for Repair of  Cerberal Spinal Fluid  Leak;  Surgeon: Kary Kos, MD;  Location: [place];  Service: Neurosurgery;  Laterality: N/A;  . LUMBAR LAMINECTOMY/DECOMPRESSION MICRODISCECTOMY Left 03/01/2019   Procedure: LEFT LUMBAR THREE-FOUR EXTRAFORAMINAL MICRODISCECTOMY;  Surgeon: Kary Kos, MD;  Location: [place];  Service: Neurosurgery;  Laterality: Left;  . PLACEMENT LUMBOPERITONEAL SHUNT FOR CEREBROSPINAL FLUID DIVERSION AWAY FROM C8 PERINEURAL CYST  09-05-2003  &  12-24-2003   08-31-2003  REMOVAL SHUNT  . POSTERIOR CERVICAL LAMINECTOMY  03-01-2001   C7-8 and Exploration C8 perineural cyst  . POSTERIOR LUMBAR LAMINECTOMY L4-5/  DISKECTOMY L5-S1  05-20-2005  . RE-DO DECOMPRESSION LAMINECTOMY AND FUSION L4-5  &  L5-S1  12-20-2006   02-04-2007--  RE-EXPLORATION L4 to S1, I & D LUMBAR WOUND HEMATOMA  . REDUCTION MAMMAPLASTY    . SHUNT REMOVAL N/A 07/10/2016   Procedure: SHUNT REMOVAL;  Surgeon: Kary Kos, MD;  Location: [place];  Service: Neurosurgery;  Laterality: N/A;  SHUNT REMOVAL  . SPINAL CORD STIMULATOR INSERTION N/A 04/26/2015   Procedure: LUMBAR SPINAL CORD STIMULATOR INSERTION;  Surgeon: Clydell Hakim, MD;  Location: [place] [facility];  Service: Neurosurgery;  Laterality: N/A;  LUMBAR SPINAL CORD STIMULATOR INSERTION  . SPINAL CORD STIMULATOR REMOVAL N/A 06/28/2019   Procedure: Removal of dorsal column stimulator and generator -;  Surgeon: Kary Kos, MD;  Location: [place];  Service: Neurosurgery;  Laterality: N/A;  . TENNIS ELBOW RELEASE/NIRSCHEL PROCEDURE Left 12/13/2014   Procedure: LEFT ELBOW LATERAL EPICONDYLAR DEBRIDEMENT AND OR REPAIR -RECONSTRUCTION ;  Surgeon: Iran Planas, MD;  Location: [place];  Service: Orthopedics;  Laterality: Left;  . TOTAL ABDOMINAL HYSTERECTOMY  1994   w/  Bilateral Salpinoophorectomy  . VENTRICULOPERITONEAL SHUNT N/A 06/23/2017   Procedure: VENTRICULAR-PERITONEAL SHUNT INSERTION WITH ABDOMINAL LAPARASCOPY AND BRAINLAB NAVIGATION;  Surgeon: Kary Kos, MD;  Location: [place];  Service: Neurosurgery;  Laterality: N/A;    Family History  Problem Relation Age of Onset  . Depression Other   . Colon cancer Father   . Ovarian cancer Maternal Grandmother   . Pancreatic cancer Maternal Grandfather     Social History   Socioeconomic History  . Marital status: Divorced    Spouse name: Not on file  . Number of children: 2  . Years of education: Assoc  .  Highest education level: Associate degree: academic program  Occupational History  . Occupation: Disabled   Tobacco Use  . Smoking status: Never Smoker  . Smokeless tobacco: Never Used  Vaping Use  . Vaping Use: Never used  Substance and Sexual Activity  . Alcohol use: Yes    Comment: once a year  . Drug use: No  . Sexual activity: Not Currently    Birth control/protection: Surgical  Other Topics Concern  . Not on file  Social History Narrative   Caffeine: occasionally    Mrs Mcelwee is divorced, on permanent disability and lives at home with her mother and her daughter. She is independent in her care needs. She denies concern with transportation to medical appointments She is on a fixed income   Social Determinants of Health   Financial Resource Strain: Low Risk   . Difficulty of Paying Living Expenses: Not very hard  Food Insecurity: Food Insecurity Present  . Worried About Charity fundraiser in the Last Year: Sometimes  true  . Ran Out of Food in the Last Year: Never true  Transportation Needs: No Transportation Needs  . Lack of Transportation (Medical): No  . Lack of Transportation (Non-Medical): No  Physical Activity: Inactive  . Days of Exercise per Week: 0 days  . Minutes of Exercise per Session: 0 min  Stress: Stress Concern Present  . Feeling of Stress : To some extent  Social Connections: Socially Isolated  . Frequency of Communication with Friends and Family: More than three times a week  . Frequency of Social Gatherings with Friends and Family: Once a week  . Attends Religious Services: Never  . Active Member of Clubs or Organizations: No  . Attends Archivist Meetings: Never  . Marital Status: Divorced  Human resources officer Violence: Not At Risk  . Fear of Current or Ex-Partner: No  . Emotionally Abused: No  . Physically Abused: No  . Sexually Abused: No     Current Outpatient Medications:  .  DULoxetine (CYMBALTA) 60 MG capsule, TAKE 2 CAPSULES BY MOUTH EVERY DAY, Disp: 180 capsule, Rfl: 1 .  methadone (DOLOPHINE) 5 MG tablet, Take 2.5 mg by mouth daily. , Disp: , Rfl: 0 .  methocarbamol (ROBAXIN) 500 MG tablet, Take 500 mg by mouth every 6 (six) hours as needed for muscle spasms., Disp: , Rfl:  .  montelukast (SINGULAIR) 10 MG tablet, TAKE 1 TABLET BY MOUTH EVERYDAY AT BEDTIME, Disp: 90 tablet, Rfl: 1 .  OZEMPIC, 1 MG/DOSE, 4 MG/3ML SOPN, INJECT 1 MG AS DIRECTED ONCE A WEEK FOR 8 DOSES., Disp: 3 mL, Rfl: 1 .  polyethylene glycol (MIRALAX / GLYCOLAX) 17 g packet, Take 17 g by mouth daily as needed for mild constipation or moderate constipation., Disp: , Rfl:  .  pramipexole (MIRAPEX) 0.5 MG tablet, TAKE 1 TABLET BY MOUTH AT BEDTIME., Disp: 90 tablet, Rfl: 3 .  simvastatin (ZOCOR) 20 MG tablet, TAKE 1 TABLET BY MOUTH EVERY DAY, Disp: 90 tablet, Rfl: 1 .  traMADol (ULTRAM) 50 MG tablet, Take 50-100 mg by mouth every 8 (eight) hours as needed (Pain). , Disp: , Rfl:  .  NARCAN 4  MG/0.1ML LIQD nasal spray kit, Place 0.4 mg into the nose once. , Disp: , Rfl:  .  OZEMPIC, 1 MG/DOSE, 2 MG/1.5ML SOPN, INJECT 1 MG INTO THE SKIN ONCE A WEEK., Disp: 12 pen, Rfl: 1  EXAM:  VITALS per patient if applicable: Wt 175 lb (79.4 kg)   BMI 31.01  kg/m   GENERAL: alert, oriented, appears well and in no acute distress  HEENT: atraumatic, conjunttiva clear, no obvious abnormalities on inspection of external nose and ears  NECK: normal movements of the head and neck  LUNGS: on inspection no signs of respiratory distress, breathing rate appears normal, no obvious gross SOB, gasping or wheezing  CV: no obvious cyanosis  MS: moves all visible extremities without noticeable abnormality  PSYCH/NEURO: pleasant and cooperative, no obvious depression or anxiety, speech and thought processing grossly intact  ASSESSMENT AND PLAN:  Discussed the following assessment and plan:  URI (upper respiratory infection) Symptoms consistent with viral etiology.  There may also be allergy component as well.  She'll continue singulair.  Recommend nasal saline rinses for congestion and addition of cetirizine daily.   Continue supportive care with increased fluids and rest.  Instructed to contact clinic if she develops new or worsening symptoms.       I discussed the assessment and treatment plan with the patient. The patient was provided an opportunity to ask questions and all were answered. The patient agreed with the plan and demonstrated an understanding of the instructions.   The patient was advised to call back or seek an in-person evaluation if the symptoms worsen or if the condition fails to improve as anticipated.    Luetta Nutting, DO

## 2020-08-21 NOTE — Assessment & Plan Note (Signed)
Symptoms consistent with viral etiology.  There may also be allergy component as well.  She'll continue singulair.  Recommend nasal saline rinses for congestion and addition of cetirizine daily.   Continue supportive care with increased fluids and rest.  Instructed to contact clinic if she develops new or worsening symptoms.

## 2020-08-22 ENCOUNTER — Telehealth: Payer: Self-pay | Admitting: *Deleted

## 2020-08-22 DIAGNOSIS — Z006 Encounter for examination for normal comparison and control in clinical research program: Secondary | ICD-10-CM

## 2020-08-22 NOTE — Telephone Encounter (Signed)
I called patient for 90-day Identify Study phone call. Patient is doing well without any cardiac symptoms. I reminded patient I would call her in November for 1 year follow-up for the study.

## 2020-08-27 ENCOUNTER — Ambulatory Visit: Payer: Medicare HMO | Admitting: Psychology

## 2020-09-02 ENCOUNTER — Ambulatory Visit (INDEPENDENT_AMBULATORY_CARE_PROVIDER_SITE_OTHER): Payer: Medicare HMO | Admitting: Family Medicine

## 2020-09-02 ENCOUNTER — Encounter: Payer: Self-pay | Admitting: Family Medicine

## 2020-09-02 ENCOUNTER — Other Ambulatory Visit: Payer: Self-pay

## 2020-09-02 DIAGNOSIS — B029 Zoster without complications: Secondary | ICD-10-CM | POA: Diagnosis not present

## 2020-09-02 MED ORDER — VALACYCLOVIR HCL 1 G PO TABS: 1000.0000 mg | ORAL_TABLET | Freq: Three times a day (TID) | ORAL | 0 refills | Status: AC

## 2020-09-02 MED ORDER — GABAPENTIN 300 MG PO CAPS: 300.0000 mg | ORAL_CAPSULE | Freq: Three times a day (TID) | ORAL | 3 refills | Status: DC

## 2020-09-02 NOTE — Assessment & Plan Note (Signed)
History and exam consistent with shingles.  Will start valtrex 1g tid x7 days.  She is already on opioid analgesics with methadone.  Will add gabapentin 300mg  tid for management of pain.  We can consider addition of steroids as well if pain remains severe.  No otic involvement noted.  Discussed signs of ocular involvement and she will let me know of new or evolving symptoms. Follow up in about 7 days.

## 2020-09-02 NOTE — Progress Notes (Signed)
Katrina Fernandez - 55 y.o. female MRN [id number]  Date of birth: 1965/10/17  Subjective Chief Complaint  Patient presents with  . Rash    HPI Katrina Fernandez is a 55 y.o. female here today with complaint of painful rash.  Report that she began having pain along ear and forehead a few days ago and rash appeared yesterday along hairline of Left temple area and forehead.  Rash is vesicular in nature and very tender to touch.  She denies eye pain, watering or vision changes.  She is having ear pain but denies changes to hearing.  She has not had fever or chills.  Admits to increased stress as her daughter is currently receiving chemotherapy.    ROS:  A comprehensive ROS was completed and negative except as noted per HPI  Allergies  Allergen Reactions  . Penicillins Anaphylaxis    Has patient had a PCN reaction causing immediate rash, facial/tongue/throat swelling, SOB or lightheadedness with hypotension: Yes Has patient had a PCN reaction causing severe rash involving mucus membranes or skin necrosis: No Has patient had a PCN reaction that required hospitalization No Has patient had a PCN reaction occurring within the last 10 years: No If all of the above answers are "NO", then may proceed with Cephalosporin use.   . Tape Rash    Plastic tape, silicone tape, Steri Strips  . Lactose Nausea And Vomiting and Swelling       . Chlorhexidine Rash and Other (See Comments)    Dermatitis - Allergic    Past Medical History:  Diagnosis Date  . Anemia   . Anxiety   . Arthritis    "back and knees" (07/23/2016)  . Chiari malformation type I ([place]) dx'd 2014  . Chronic back pain    buldging, DDD; "neck and lower back" (07/23/2016)  . Chronic constipation    takes Miralax daily  . Chronic pain syndrome    takes Methadone and Keppra daily  . Depression    takes Effexor daily  . Endometriosis    1995  . Fibromyalgia   . GERD (gastroesophageal reflux disease)    not taking anything at the  present time  . History of DVT of lower extremity 07/2012   LEFT LEG --  3/ 2014.Was on a blood transfusion for 6 months  . History of kidney stones   . History of MRSA infection 10 yrs ago  . Hyperlipidemia    takes Simvastatin daily  . Migraine    "daily recently" (07/23/2016)  . Muscle spasm    takes Zanaflex nightly  . Peripheral edema    takes Lasix daily as needed  . Peripheral neuropathy   . Pleurisy 2000  . Pneumonia    hx of-7-8 yrs ago  . PONV (postoperative nausea and vomiting)   . Seasonal allergies    takes Claritin daily  . Spinal headache    "since 2002; still get them; try to manage it w/lying flat"    Past Surgical History:  Procedure Laterality Date  . ANTERIOR CERVICAL DECOMP/DISCECTOMY FUSION  07-25-2009   C5 -- C6  . APPENDECTOMY  age 66  . APPLICATION OF CRANIAL NAVIGATION N/A 06/23/2017   Procedure: APPLICATION OF CRANIAL NAVIGATION;  Surgeon: Kary Kos, MD;  Location: [place];  Service: Neurosurgery;  Laterality: N/A;  . BACK SURGERY    . BRAIN SURGERY     VP shunt  . BREAST REDUCTION SURGERY Bilateral 06/07/2013   Procedure: BILATERAL MAMMARY REDUCTION  (BREAST);  Surgeon:  Claire Sanger, DO;  Location: [place];  Service: Plastics;  Laterality: Bilateral;  . BREAST REDUCTION SURGERY Bilateral 06/07/2013   Procedure:  LIPOSUCTION;  Surgeon: Theodoro Kos, DO;  Location: [place];  Service: Plastics;  Laterality: Bilateral;  . CARPAL TUNNEL RELEASE Bilateral 2001  . COLONOSCOPY  X 2   "polyps were benign"  . DILATION AND CURETTAGE OF UTERUS    . ESOPHAGOGASTRODUODENOSCOPY    . gastric banding undone  11/2014  . LAPAROSCOPIC CHOLECYSTECTOMY  ~ 2000  . LAPAROSCOPIC GASTRIC BANDING  2011  . LAPAROSCOPIC REVISION VENTRICULAR-PERITONEAL (V-P) SHUNT N/A 06/23/2017   Procedure: Shunt Placment stereotactic ventricular catheter placement and general surgery abdominal exposure  (Laparoscopic vs. Open) Brain Lab;  Surgeon: Clovis Riley, MD;  Location: [place];  Service: General;  Laterality: N/A;  . LAPAROSCOPY N/A 06/23/2017   Procedure: LAPAROSCOPY DIAGNOSTIC;  Surgeon: Clovis Riley, MD;  Location: [place];  Service: General;  Laterality: N/A;  . LUMBAR LAMINECTOMY/DECOMPRESSION MICRODISCECTOMY N/A 07/23/2016   Procedure: Decompressive Lumbar Laminectomy Lumbar two-three  for Repair of  Cerberal Spinal Fluid  Leak;  Surgeon: Kary Kos, MD;  Location: [place];  Service: Neurosurgery;  Laterality: N/A;  . LUMBAR LAMINECTOMY/DECOMPRESSION MICRODISCECTOMY Left 03/01/2019   Procedure: LEFT LUMBAR THREE-FOUR EXTRAFORAMINAL MICRODISCECTOMY;  Surgeon: Kary Kos, MD;  Location: [place];  Service: Neurosurgery;  Laterality: Left;  . PLACEMENT LUMBOPERITONEAL SHUNT FOR CEREBROSPINAL FLUID DIVERSION AWAY FROM C8 PERINEURAL CYST  09-05-2003  &  12-24-2003   08-31-2003  REMOVAL SHUNT  . POSTERIOR CERVICAL LAMINECTOMY  03-01-2001   C7-8 and Exploration C8 perineural cyst  . POSTERIOR LUMBAR LAMINECTOMY L4-5/  DISKECTOMY L5-S1  05-20-2005  . RE-DO DECOMPRESSION LAMINECTOMY AND FUSION L4-5  &  L5-S1  12-20-2006   02-04-2007--  RE-EXPLORATION L4 to S1, I & D LUMBAR WOUND HEMATOMA  . REDUCTION MAMMAPLASTY    . SHUNT REMOVAL N/A 07/10/2016   Procedure: SHUNT REMOVAL;  Surgeon: Kary Kos, MD;  Location: [place];  Service: Neurosurgery;  Laterality: N/A;  SHUNT REMOVAL  . SPINAL CORD STIMULATOR INSERTION N/A 04/26/2015   Procedure: LUMBAR SPINAL CORD STIMULATOR INSERTION;  Surgeon: Clydell Hakim, MD;  Location: [place] [facility];  Service: Neurosurgery;  Laterality: N/A;  LUMBAR SPINAL CORD STIMULATOR INSERTION  . SPINAL CORD STIMULATOR REMOVAL N/A 06/28/2019   Procedure: Removal of dorsal column stimulator and generator -;  Surgeon: Kary Kos, MD;  Location: [place];  Service: Neurosurgery;  Laterality: N/A;  . TENNIS ELBOW RELEASE/NIRSCHEL PROCEDURE Left 12/13/2014   Procedure: LEFT ELBOW LATERAL EPICONDYLAR DEBRIDEMENT AND OR REPAIR -RECONSTRUCTION ;   Surgeon: Iran Planas, MD;  Location: [place];  Service: Orthopedics;  Laterality: Left;  . TOTAL ABDOMINAL HYSTERECTOMY  1994   w/  Bilateral Salpinoophorectomy  . VENTRICULOPERITONEAL SHUNT N/A 06/23/2017   Procedure: VENTRICULAR-PERITONEAL SHUNT INSERTION WITH ABDOMINAL LAPARASCOPY AND BRAINLAB NAVIGATION;  Surgeon: Kary Kos, MD;  Location: [place];  Service: Neurosurgery;  Laterality: N/A;    Social History   Socioeconomic History  . Marital status: Divorced    Spouse name: Not on file  . Number of children: 2  . Years of education: Assoc  . Highest education level: Associate degree: academic program  Occupational History  . Occupation: Disabled   Tobacco Use  . Smoking status: Never Smoker  . Smokeless tobacco: Never Used  Vaping Use  . Vaping Use: Never used  Substance and Sexual Activity  . Alcohol use: Yes    Comment:  once a year  . Drug use: No  . Sexual activity: Not Currently    Birth control/protection: Surgical  Other Topics Concern  . Not on file  Social History Narrative   Caffeine: occasionally    Katrina Fernandez is divorced, on permanent disability and lives at home with her mother and her daughter. She is independent in her care needs. She denies concern with transportation to medical appointments She is on a fixed income   Social Determinants of Health   Financial Resource Strain: Low Risk   . Difficulty of Paying Living Expenses: Not very hard  Food Insecurity: Food Insecurity Present  . Worried About Charity fundraiser in the Last Year: Sometimes true  . Ran Out of Food in the Last Year: Never true  Transportation Needs: No Transportation Needs  . Lack of Transportation (Medical): No  . Lack of Transportation (Non-Medical): No  Physical Activity: Inactive  . Days of Exercise per Week: 0 days  . Minutes of Exercise per Session: 0 min  Stress: Stress Concern Present  . Feeling of Stress : To some extent  Social Connections: Socially  Isolated  . Frequency of Communication with Friends and Family: More than three times a week  . Frequency of Social Gatherings with Friends and Family: Once a week  . Attends Religious Services: Never  . Active Member of Clubs or Organizations: No  . Attends Archivist Meetings: Never  . Marital Status: Divorced    Family History  Problem Relation Age of Onset  . Depression Other   . Colon cancer Father   . Ovarian cancer Maternal Grandmother   . Pancreatic cancer Maternal Grandfather     Health Maintenance  Topic Date Due  . COVID-19 Vaccine (2 - Moderna 3-dose series) 12/05/2019  . TETANUS/TDAP  07/24/2021 (Originally 05/19/2019)  . Hepatitis C Screening  07/24/2021 (Originally 09/05/1965)  . INFLUENZA VACCINE  12/16/2020  . MAMMOGRAM  07/27/2022  . COLONOSCOPY (Pts 45-40yrs Insurance coverage will need to be confirmed)  07/05/2023  . HIV Screening  Completed  . HPV VACCINES  Aged Out     ----------------------------------------------------------------------------------------------------------------------------------------------------------------------------------------------------------------- Physical Exam BP (!) 148/96 (BP Location: Left Arm, Patient Position: Sitting, Cuff Size: Normal)   Pulse 90   Ht 5\' 3"  (1.6 m)   Wt 179 lb (81.2 kg)   SpO2 96%   BMI 31.71 kg/m   Physical Exam Constitutional:      Appearance: Normal appearance.  HENT:     Right Ear: Tympanic membrane and ear canal normal.     Left Ear: Tympanic membrane and ear canal normal.  Eyes:     General: No scleral icterus.    Extraocular Movements: Extraocular movements intact.     Conjunctiva/sclera: Conjunctivae normal.     Pupils: Pupils are equal, round, and reactive to light.  Cardiovascular:     Rate and Rhythm: Normal rate and regular rhythm.  Musculoskeletal:     Cervical back: Neck supple.  Skin:    Comments: Vesicular rash along L temporal hairline and forehead area.     Neurological:     General: No focal deficit present.     Mental Status: She is alert.  Psychiatric:        Mood and Affect: Mood normal.        Behavior: Behavior normal.     ------------------------------------------------------------------------------------------------------------------------------------------------------------------------------------------------------------------- Assessment and Plan  Shingles History and exam consistent with shingles.  Will start valtrex 1g tid x7 days.  She is already on  opioid analgesics with methadone.  Will add gabapentin 300mg  tid for management of pain.  We can consider addition of steroids as well if pain remains severe.  No otic involvement noted.  Discussed signs of ocular involvement and she will let me know of new or evolving symptoms. Follow up in about 7 days.     Meds ordered this encounter  Medications  . valACYclovir (VALTREX) 1000 MG tablet    Sig: Take 1 tablet (1,000 mg total) by mouth 3 (three) times daily for 7 days.    Dispense:  21 tablet    Refill:  0  . gabapentin (NEURONTIN) 300 MG capsule    Sig: Take 1 capsule (300 mg total) by mouth 3 (three) times daily.    Dispense:  90 capsule    Refill:  3    No follow-ups on file.    This visit occurred during the SARS-CoV-2 public health emergency.  Safety protocols were in place, including screening questions prior to the visit, additional usage of staff PPE, and extensive cleaning of exam room while observing appropriate contact time as indicated for disinfecting solutions.

## 2020-09-02 NOTE — Patient Instructions (Signed)
If you start to have increased eye pain, redness- Please let me know.    Shingles  Shingles is an infection. It gives you a painful skin rash and blisters that have fluid in them. Shingles is caused by the same germ (virus) that causes chickenpox. Shingles only happens in people who:  Have had chickenpox.  Have been given a shot of medicine (vaccine) to protect against chickenpox. Shingles is rare in this group. The first symptoms of shingles may be itching, tingling, or pain in an area on your skin. A rash will show on your skin a few days or weeks later. The rash is likely to be on one side of your body. The rash usually has a shape like a belt or a band. Over time, the rash turns into fluid-filled blisters. The blisters will break open, change into scabs, and dry up. Medicines may:  Help with pain and itching.  Help you get better sooner.  Help to prevent long-term problems. Follow these instructions at home: Medicines  Take over-the-counter and prescription medicines only as told by your doctor.  Put on an anti-itch cream or numbing cream where you have a rash, blisters, or scabs. Do this as told by your doctor. Helping with itching and discomfort  Put cold, wet cloths (cold compresses) on the area of the rash or blisters as told by your doctor.  Cool baths can help you feel better. Try adding baking soda or dry oatmeal to the water to lessen itching. Do not bathe in hot water.   Blister and rash care  Keep your rash covered with a loose bandage (dressing).  Wear loose clothing that does not rub on your rash.  Keep your rash and blisters clean. To do this, wash the area with mild soap and cool water as told by your doctor.  Check your rash every day for signs of infection. Check for: ? More redness, swelling, or pain. ? Fluid or blood. ? Warmth. ? Pus or a bad smell.  Do not scratch your rash. Do not pick at your blisters. To help you to not scratch: ? Keep your  fingernails clean and cut short. ? Wear gloves or mittens when you sleep, if scratching is a problem. General instructions  Rest as told by your doctor.  Keep all follow-up visits as told by your doctor. This is important.  Wash your hands often with soap and water. If soap and water are not available, use hand sanitizer. Doing this lowers your chance of getting a skin infection caused by germs (bacteria).  Your infection can cause chickenpox in people who have never had chickenpox or never got a shot of chickenpox vaccine. If you have blisters that did not change into scabs yet, try not to touch other people or be around other people, especially: ? Babies. ? Pregnant women. ? Children who have areas of red, itchy, or rough skin (eczema). ? Very old people who have transplants. ? People who have a long-term (chronic) sickness, like cancer or AIDS. Contact a doctor if:  Your pain does not get better with medicine.  Your pain does not get better after the rash heals.  You have any signs of infection in the rash area. These signs include: ? More redness, swelling, or pain around the rash. ? Fluid or blood coming from the rash. ? The rash area feeling warm to the touch. ? Pus or a bad smell coming from the rash. Get help right away if:  The  rash is on your face or nose.  You have pain in your face or pain by your eye.  You lose feeling on one side of your face.  You have trouble seeing.  You have ear pain, or you have ringing in your ear.  You have a loss of taste.  Your condition gets worse. Summary  Shingles gives you a painful skin rash and blisters that have fluid in them.  Shingles is an infection. It is caused by the same germ (virus) that causes chickenpox.  Keep your rash covered with a loose bandage (dressing). Wear loose clothing that does not rub on your rash.  If you have blisters that did not change into scabs yet, try not to touch other people or be around  people. This information is not intended to replace advice given to you by your health care provider. Make sure you discuss any questions you have with your health care provider. Document Revised: 08/26/2018 Document Reviewed: 01/06/2017 Elsevier Patient Education  2021 [employer].

## 2020-09-03 ENCOUNTER — Encounter: Payer: Self-pay | Admitting: Family Medicine

## 2020-09-05 ENCOUNTER — Encounter: Payer: Self-pay | Admitting: Family Medicine

## 2020-09-05 DIAGNOSIS — B029 Zoster without complications: Secondary | ICD-10-CM

## 2020-09-05 DIAGNOSIS — B0231 Zoster conjunctivitis: Secondary | ICD-10-CM

## 2020-09-05 DIAGNOSIS — B0230 Zoster ocular disease, unspecified: Secondary | ICD-10-CM | POA: Diagnosis not present

## 2020-09-09 ENCOUNTER — Encounter: Payer: Self-pay | Admitting: Family Medicine

## 2020-09-10 ENCOUNTER — Ambulatory Visit (INDEPENDENT_AMBULATORY_CARE_PROVIDER_SITE_OTHER): Payer: Medicare HMO | Admitting: Psychology

## 2020-09-10 DIAGNOSIS — M7061 Trochanteric bursitis, right hip: Secondary | ICD-10-CM | POA: Diagnosis not present

## 2020-09-10 DIAGNOSIS — F411 Generalized anxiety disorder: Secondary | ICD-10-CM | POA: Diagnosis not present

## 2020-09-10 DIAGNOSIS — M961 Postlaminectomy syndrome, not elsewhere classified: Secondary | ICD-10-CM | POA: Diagnosis not present

## 2020-09-10 DIAGNOSIS — F4323 Adjustment disorder with mixed anxiety and depressed mood: Secondary | ICD-10-CM | POA: Diagnosis not present

## 2020-09-10 DIAGNOSIS — M5440 Lumbago with sciatica, unspecified side: Secondary | ICD-10-CM | POA: Diagnosis not present

## 2020-09-10 DIAGNOSIS — M79609 Pain in unspecified limb: Secondary | ICD-10-CM | POA: Diagnosis not present

## 2020-09-10 DIAGNOSIS — M79605 Pain in left leg: Secondary | ICD-10-CM | POA: Diagnosis not present

## 2020-09-10 DIAGNOSIS — M7712 Lateral epicondylitis, left elbow: Secondary | ICD-10-CM | POA: Diagnosis not present

## 2020-09-10 DIAGNOSIS — M5417 Radiculopathy, lumbosacral region: Secondary | ICD-10-CM | POA: Diagnosis not present

## 2020-09-10 DIAGNOSIS — R69 Illness, unspecified: Secondary | ICD-10-CM | POA: Diagnosis not present

## 2020-09-10 DIAGNOSIS — Z79899 Other long term (current) drug therapy: Secondary | ICD-10-CM | POA: Diagnosis not present

## 2020-09-10 DIAGNOSIS — M542 Cervicalgia: Secondary | ICD-10-CM | POA: Diagnosis not present

## 2020-09-11 ENCOUNTER — Encounter: Payer: Self-pay | Admitting: Family Medicine

## 2020-09-11 ENCOUNTER — Other Ambulatory Visit: Payer: Self-pay

## 2020-09-11 ENCOUNTER — Ambulatory Visit (INDEPENDENT_AMBULATORY_CARE_PROVIDER_SITE_OTHER): Payer: Medicare HMO | Admitting: Family Medicine

## 2020-09-11 DIAGNOSIS — B0231 Zoster conjunctivitis: Secondary | ICD-10-CM

## 2020-09-11 NOTE — Assessment & Plan Note (Signed)
Resolving rash.  She can try topical capsaicin to the forehead and temporal area.  Discussed avoiding the eyes with this.  She will let me know if any new lesions appear.  She plans to get shingles vaccine once this has all resolved.

## 2020-09-11 NOTE — Progress Notes (Signed)
Katrina Fernandez - 55 y.o. female MRN [id number]  Date of birth: [birth year]  Subjective Chief Complaint  Patient presents with  . Herpes Zoster    HPI Katrina Fernandez is a 55 y.o. female here today for follow up of shingles.  She has completed course of valtrex.  She did develop some involvement of the cornea and was seen by ophthalmology.  Started on erythromycin ointment. I had also started her on gabapentin but she noted weight gain with this so she discontinued.  Pain was fairly severe initially however has improved some.  She is taking methadone, managed by pain clinic.  Lesions are starting to clear up.   ROS:  A comprehensive ROS was completed and negative except as noted per HPI  Allergies  Allergen Reactions  . Penicillins Anaphylaxis    Has patient had a PCN reaction causing immediate rash, facial/tongue/throat swelling, SOB or lightheadedness with hypotension: Yes Has patient had a PCN reaction causing severe rash involving mucus membranes or skin necrosis: No Has patient had a PCN reaction that required hospitalization No Has patient had a PCN reaction occurring within the last 10 years: No If all of the above answers are "NO", then may proceed with Cephalosporin use.   . Tape Rash    Plastic tape, silicone tape, Steri Strips  . Lactose Nausea And Vomiting and Swelling       . Chlorhexidine Rash and Other (See Comments)    Dermatitis - Allergic    Past Medical History:  Diagnosis Date  . Anemia   . Anxiety   . Arthritis    "back and knees" (07/23/2016)  . Chiari malformation type I ([place]) dx'd 2014  . Chronic back pain    buldging, DDD; "neck and lower back" (07/23/2016)  . Chronic constipation    takes Miralax daily  . Chronic pain syndrome    takes Methadone and Keppra daily  . Depression    takes Effexor daily  . Endometriosis    1995  . Fibromyalgia   . GERD (gastroesophageal reflux disease)    not taking anything at the present time  . History of DVT  of lower extremity 07/2012   LEFT LEG --  3/ 2014.Was on a blood transfusion for 6 months  . History of kidney stones   . History of MRSA infection 10 yrs ago  . Hyperlipidemia    takes Simvastatin daily  . Migraine    "daily recently" (07/23/2016)  . Muscle spasm    takes Zanaflex nightly  . Peripheral edema    takes Lasix daily as needed  . Peripheral neuropathy   . Pleurisy 2000  . Pneumonia    hx of-7-8 yrs ago  . PONV (postoperative nausea and vomiting)   . Seasonal allergies    takes Claritin daily  . Spinal headache    "since 2002; still get them; try to manage it w/lying flat"    Past Surgical History:  Procedure Laterality Date  . ANTERIOR CERVICAL DECOMP/DISCECTOMY FUSION  07-25-2009   C5 -- C6  . APPENDECTOMY  age 68  . APPLICATION OF CRANIAL NAVIGATION N/A 06/23/2017   Procedure: APPLICATION OF CRANIAL NAVIGATION;  Surgeon: Kary Kos, MD;  Location: [facility];  Service: Neurosurgery;  Laterality: N/A;  . BACK SURGERY    . BRAIN SURGERY     VP shunt  . BREAST REDUCTION SURGERY Bilateral 06/07/2013   Procedure: BILATERAL MAMMARY REDUCTION  (BREAST);  Surgeon: Theodoro Kos, DO;  Location: [facility];  Service: Clinical cytogeneticist;  Laterality: Bilateral;  . BREAST REDUCTION SURGERY Bilateral 06/07/2013   Procedure:  LIPOSUCTION;  Surgeon: Theodoro Kos, DO;  Location: [facility];  Service: Plastics;  Laterality: Bilateral;  . CARPAL TUNNEL RELEASE Bilateral 2001  . COLONOSCOPY  X 2   "polyps were benign"  . DILATION AND CURETTAGE OF UTERUS    . ESOPHAGOGASTRODUODENOSCOPY    . gastric banding undone  11/2014  . LAPAROSCOPIC CHOLECYSTECTOMY  ~ 2000  . LAPAROSCOPIC GASTRIC BANDING  2011  . LAPAROSCOPIC REVISION VENTRICULAR-PERITONEAL (V-P) SHUNT N/A 06/23/2017   Procedure: Shunt Placment stereotactic ventricular catheter placement and general surgery abdominal exposure  (Laparoscopic vs. Open) Brain Lab;  Surgeon: Clovis Riley, MD;  Location: [facility];   Service: General;  Laterality: N/A;  . LAPAROSCOPY N/A 06/23/2017   Procedure: LAPAROSCOPY DIAGNOSTIC;  Surgeon: Clovis Riley, MD;  Location: [facility];  Service: General;  Laterality: N/A;  . LUMBAR LAMINECTOMY/DECOMPRESSION MICRODISCECTOMY N/A 07/23/2016   Procedure: Decompressive Lumbar Laminectomy Lumbar two-three  for Repair of  Cerberal Spinal Fluid  Leak;  Surgeon: Kary Kos, MD;  Location: [facility];  Service: Neurosurgery;  Laterality: N/A;  . LUMBAR LAMINECTOMY/DECOMPRESSION MICRODISCECTOMY Left 03/01/2019   Procedure: LEFT LUMBAR THREE-FOUR EXTRAFORAMINAL MICRODISCECTOMY;  Surgeon: Kary Kos, MD;  Location: [facility];  Service: Neurosurgery;  Laterality: Left;  . PLACEMENT LUMBOPERITONEAL SHUNT FOR CEREBROSPINAL FLUID DIVERSION AWAY FROM C8 PERINEURAL CYST  09-05-2003  &  12-24-2003   08-31-2003  REMOVAL SHUNT  . POSTERIOR CERVICAL LAMINECTOMY  03-01-2001   C7-8 and Exploration C8 perineural cyst  . POSTERIOR LUMBAR LAMINECTOMY L4-5/  DISKECTOMY L5-S1  05-20-2005  . RE-DO DECOMPRESSION LAMINECTOMY AND FUSION L4-5  &  L5-S1  12-20-2006   02-04-2007--  RE-EXPLORATION L4 to S1, I & D LUMBAR WOUND HEMATOMA  . REDUCTION MAMMAPLASTY    . SHUNT REMOVAL N/A 07/10/2016   Procedure: SHUNT REMOVAL;  Surgeon: Kary Kos, MD;  Location: [facility];  Service: Neurosurgery;  Laterality: N/A;  SHUNT REMOVAL  . SPINAL CORD STIMULATOR INSERTION N/A 04/26/2015   Procedure: LUMBAR SPINAL CORD STIMULATOR INSERTION;  Surgeon: Clydell Hakim, MD;  Location: [facility];  Service: Neurosurgery;  Laterality: N/A;  LUMBAR SPINAL CORD STIMULATOR INSERTION  . SPINAL CORD STIMULATOR REMOVAL N/A 06/28/2019   Procedure: Removal of dorsal column stimulator and generator -;  Surgeon: Kary Kos, MD;  Location: [facility];  Service: Neurosurgery;  Laterality: N/A;  . TENNIS ELBOW RELEASE/NIRSCHEL PROCEDURE Left 12/13/2014   Procedure: LEFT ELBOW LATERAL EPICONDYLAR DEBRIDEMENT AND OR REPAIR -RECONSTRUCTION ;  Surgeon: Iran Planas, MD;   Location: [facility];  Service: Orthopedics;  Laterality: Left;  . TOTAL ABDOMINAL HYSTERECTOMY  1994   w/  Bilateral Salpinoophorectomy  . VENTRICULOPERITONEAL SHUNT N/A 06/23/2017   Procedure: VENTRICULAR-PERITONEAL SHUNT INSERTION WITH ABDOMINAL LAPARASCOPY AND BRAINLAB NAVIGATION;  Surgeon: Kary Kos, MD;  Location: [facility];  Service: Neurosurgery;  Laterality: N/A;    Social History   Socioeconomic History  . Marital status: Divorced    Spouse name: Not on file  . Number of children: 2  . Years of education: Assoc  . Highest education level: Associate degree: academic program  Occupational History  . Occupation: Disabled   Tobacco Use  . Smoking status: Never Smoker  . Smokeless tobacco: Never Used  Vaping Use  . Vaping Use: Never used  Substance and Sexual Activity  . Alcohol use: Yes    Comment: once a year  . Drug use: No  .  Sexual activity: Not Currently    Birth control/protection: Surgical  Other Topics Concern  . Not on file  Social History Narrative   Caffeine: occasionally    Mrs Romano is divorced, on permanent disability and lives at home with her mother and her daughter. She is independent in her care needs. She denies concern with transportation to medical appointments She is on a fixed income   Social Determinants of Health   Financial Resource Strain: Low Risk   . Difficulty of Paying Living Expenses: Not very hard  Food Insecurity: Food Insecurity Present  . Worried About Charity fundraiser in the Last Year: Sometimes true  . Ran Out of Food in the Last Year: Never true  Transportation Needs: No Transportation Needs  . Lack of Transportation (Medical): No  . Lack of Transportation (Non-Medical): No  Physical Activity: Inactive  . Days of Exercise per Week: 0 days  . Minutes of Exercise per Session: 0 min  Stress: Stress Concern Present  . Feeling of Stress : To some extent  Social Connections: Socially Isolated  . Frequency of  Communication with Friends and Family: More than three times a week  . Frequency of Social Gatherings with Friends and Family: Once a week  . Attends Religious Services: Never  . Active Member of Clubs or Organizations: No  . Attends Archivist Meetings: Never  . Marital Status: Divorced    Family History  Problem Relation Age of Onset  . Depression Other   . Colon cancer Father   . Ovarian cancer Maternal Grandmother   . Pancreatic cancer Maternal Grandfather     Health Maintenance  Topic Date Due  . COVID-19 Vaccine (2 - Moderna 3-dose series) 12/05/2019  . TETANUS/TDAP  07/24/2021 (Originally 05/19/2019)  . Hepatitis C Screening  07/24/2021 (Originally 05-11-1966)  . INFLUENZA VACCINE  12/16/2020  . MAMMOGRAM  07/27/2022  . COLONOSCOPY (Pts 45-62yrs Insurance coverage will need to be confirmed)  07/05/2023  . HIV Screening  Completed  . HPV VACCINES  Aged Out     ----------------------------------------------------------------------------------------------------------------------------------------------------------------------------------------------------------------- Physical Exam BP 139/85 (BP Location: Left Arm, Patient Position: Sitting, Cuff Size: Large)   Pulse 86   Ht 5\' 3"  (1.6 m)   Wt 180 lb (81.6 kg)   SpO2 98%   BMI 31.89 kg/m   Physical Exam Constitutional:      Appearance: Normal appearance.  Cardiovascular:     Rate and Rhythm: Normal rate and regular rhythm.  Pulmonary:     Effort: Pulmonary effort is normal.     Breath sounds: Normal breath sounds.  Skin:    Comments: Rash to forehead/temporal area improved with scabbed over lesions.  Still with TTP.   Neurological:     General: No focal deficit present.     Mental Status: She is alert.  Psychiatric:        Mood and Affect: Mood normal.        Behavior: Behavior normal.      ------------------------------------------------------------------------------------------------------------------------------------------------------------------------------------------------------------------- Assessment and Plan  Shingles Resolving rash.  She can try topical capsaicin to the forehead and temporal area.  Discussed avoiding the eyes with this.  She will let me know if any new lesions appear.  She plans to get shingles vaccine once this has all resolved.     No orders of the defined types were placed in this encounter.   No follow-ups on file.    This visit occurred during the SARS-CoV-2 public health emergency.  Safety protocols were  in place, including screening questions prior to the visit, additional usage of staff PPE, and extensive cleaning of exam room while observing appropriate contact time as indicated for disinfecting solutions.

## 2020-09-11 NOTE — Patient Instructions (Signed)
Try capsaicin cream to forehead area.

## 2020-09-23 ENCOUNTER — Other Ambulatory Visit: Payer: Self-pay | Admitting: Family Medicine

## 2020-09-24 ENCOUNTER — Ambulatory Visit: Payer: Medicare HMO | Admitting: Psychology

## 2020-09-27 ENCOUNTER — Telehealth: Payer: Self-pay | Admitting: Family Medicine

## 2020-09-27 NOTE — Progress Notes (Signed)
  Chronic Care Management   Note  09/27/2020 Name: [name] MRN: [id number] DOB: May 09, 1966  Katrina EFAW is a 55 y.o. year old female who is a primary care patient of Luetta Nutting, DO. I reached out to [employer] by phone today in response to a referral sent by [name]'s PCP, Luetta Nutting, DO.   [name] was given information about Chronic Care Management services today including:  1. CCM service includes personalized support from designated clinical staff supervised by her physician, including individualized plan of care and coordination with other care providers 2. 24/7 contact phone numbers for assistance for urgent and routine care needs. 3. Service will only be billed when office clinical staff spend 20 minutes or more in a month to coordinate care. 4. Only one practitioner may furnish and bill the service in a calendar month. 5. The patient may stop CCM services at any time (effective at the end of the month) by phone call to the office staff.   Patient agreed to services and verbal consent obtained.   Follow up plan:   [name]

## 2020-09-28 ENCOUNTER — Other Ambulatory Visit: Payer: Self-pay | Admitting: Family Medicine

## 2020-10-08 ENCOUNTER — Telehealth: Payer: Self-pay | Admitting: Pharmacist

## 2020-10-08 ENCOUNTER — Ambulatory Visit (INDEPENDENT_AMBULATORY_CARE_PROVIDER_SITE_OTHER): Payer: Medicare HMO | Admitting: Psychology

## 2020-10-08 DIAGNOSIS — F411 Generalized anxiety disorder: Secondary | ICD-10-CM

## 2020-10-08 DIAGNOSIS — F4323 Adjustment disorder with mixed anxiety and depressed mood: Secondary | ICD-10-CM | POA: Diagnosis not present

## 2020-10-08 DIAGNOSIS — R69 Illness, unspecified: Secondary | ICD-10-CM | POA: Diagnosis not present

## 2020-10-08 NOTE — Progress Notes (Signed)
This encounter was created in error - please disregard.

## 2020-10-08 NOTE — Telephone Encounter (Signed)
Pt has been removed from the schedule

## 2020-10-08 NOTE — Telephone Encounter (Signed)
   Patient was a no-show for chronic care management initial visit with pharmacist. Routing to scheduling team for reschedule.   Katrina Fernandez

## 2020-10-16 ENCOUNTER — Other Ambulatory Visit: Payer: Self-pay

## 2020-10-16 ENCOUNTER — Other Ambulatory Visit: Payer: Self-pay | Admitting: Family Medicine

## 2020-10-16 ENCOUNTER — Encounter: Payer: Self-pay | Admitting: Family Medicine

## 2020-10-16 ENCOUNTER — Ambulatory Visit (INDEPENDENT_AMBULATORY_CARE_PROVIDER_SITE_OTHER): Payer: Medicare HMO | Admitting: Family Medicine

## 2020-10-16 ENCOUNTER — Ambulatory Visit (INDEPENDENT_AMBULATORY_CARE_PROVIDER_SITE_OTHER): Payer: Medicare HMO

## 2020-10-16 VITALS — BP 144/86 | HR 105 | Temp 97.7°F | Ht 63.0 in | Wt 179.0 lb

## 2020-10-16 DIAGNOSIS — R8290 Unspecified abnormal findings in urine: Secondary | ICD-10-CM

## 2020-10-16 DIAGNOSIS — R319 Hematuria, unspecified: Secondary | ICD-10-CM

## 2020-10-16 DIAGNOSIS — R309 Painful micturition, unspecified: Secondary | ICD-10-CM | POA: Diagnosis not present

## 2020-10-16 DIAGNOSIS — R109 Unspecified abdominal pain: Secondary | ICD-10-CM | POA: Diagnosis not present

## 2020-10-16 DIAGNOSIS — N1330 Unspecified hydronephrosis: Secondary | ICD-10-CM | POA: Diagnosis not present

## 2020-10-16 DIAGNOSIS — N200 Calculus of kidney: Secondary | ICD-10-CM | POA: Diagnosis not present

## 2020-10-16 DIAGNOSIS — Z9049 Acquired absence of other specified parts of digestive tract: Secondary | ICD-10-CM | POA: Diagnosis not present

## 2020-10-16 DIAGNOSIS — N3289 Other specified disorders of bladder: Secondary | ICD-10-CM | POA: Diagnosis not present

## 2020-10-16 LAB — POCT URINALYSIS DIP (CLINITEK)
Bilirubin, UA: NEGATIVE
Glucose, UA: NEGATIVE mg/dL
Ketones, POC UA: NEGATIVE mg/dL
Nitrite, UA: NEGATIVE
POC PROTEIN,UA: 100 — AB
Spec Grav, UA: 1.015 (ref 1.010–1.025)
Urobilinogen, UA: 0.2 E.U./dL
pH, UA: 6.5 (ref 5.0–8.0)

## 2020-10-16 MED ORDER — TAMSULOSIN HCL 0.4 MG PO CAPS: 0.4000 mg | ORAL_CAPSULE | Freq: Every day | ORAL | 0 refills | Status: DC

## 2020-10-16 NOTE — Assessment & Plan Note (Signed)
Symptoms and exam suspicious for stone.  She has large blood and protein in her urine.  History of stones with obstruction and with high suspicion recurrent stone.  Previous stones have not shown up on KUB.  Will proceed with stat CT scan of the abdomen and pelvis w/o contrast.  She is followed by pain management and is on chronic opioids for pain control.

## 2020-10-16 NOTE — Patient Instructions (Signed)

## 2020-10-16 NOTE — Progress Notes (Signed)
Katrina Fernandez - 55 y.o. female MRN [id number]  Date of birth: [birth year]  Subjective Chief Complaint  Patient presents with  . Flank Pain    HPI AMAYAH STAHELI is a 55 y.o. female here today with complaint of R flank pain, urinary urgency and frequency.  She has history of kidney stones and this feels similar.  She has noted some blood in her urine as well as some mucus and sediment.  She denies fever, chills, nausea or vomiting.  She did have intercourse this weekend for the first time in several years which was painful but isn't sure if this is related to her other pain or a separate issues.  Denies vaginal bleeding or discharge.   ROS:  A comprehensive ROS was completed and negative except as noted per HPI  Allergies  Allergen Reactions  . Penicillins Anaphylaxis    Has patient had a PCN reaction causing immediate rash, facial/tongue/throat swelling, SOB or lightheadedness with hypotension: Yes Has patient had a PCN reaction causing severe rash involving mucus membranes or skin necrosis: No Has patient had a PCN reaction that required hospitalization No Has patient had a PCN reaction occurring within the last 10 years: No If all of the above answers are "NO", then may proceed with Cephalosporin use.   . Tape Rash    Plastic tape, silicone tape, Steri Strips  . Lactose Nausea And Vomiting and Swelling       . Chlorhexidine Rash and Other (See Comments)    Dermatitis - Allergic    Past Medical History:  Diagnosis Date  . Anemia   . Anxiety   . Arthritis    "back and knees" (07/23/2016)  . Chiari malformation type I ([place]) dx'd 2014  . Chronic back pain    buldging, DDD; "neck and lower back" (07/23/2016)  . Chronic constipation    takes Miralax daily  . Chronic pain syndrome    takes Methadone and Keppra daily  . Depression    takes Effexor daily  . Endometriosis    1995  . Fibromyalgia   . GERD (gastroesophageal reflux disease)    not taking anything at the  present time  . History of DVT of lower extremity 07/2012   LEFT LEG --  3/ 2014.Was on a blood transfusion for 6 months  . History of kidney stones   . History of MRSA infection 10 yrs ago  . Hyperlipidemia    takes Simvastatin daily  . Migraine    "daily recently" (07/23/2016)  . Muscle spasm    takes Zanaflex nightly  . Peripheral edema    takes Lasix daily as needed  . Peripheral neuropathy   . Pleurisy 2000  . Pneumonia    hx of-7-8 yrs ago  . PONV (postoperative nausea and vomiting)   . Seasonal allergies    takes Claritin daily  . Spinal headache    "since 2002; still get them; try to manage it w/lying flat"    Past Surgical History:  Procedure Laterality Date  . ANTERIOR CERVICAL DECOMP/DISCECTOMY FUSION  07-25-2009   C5 -- C6  . APPENDECTOMY  age 87  . APPLICATION OF CRANIAL NAVIGATION N/A 06/23/2017   Procedure: APPLICATION OF CRANIAL NAVIGATION;  Surgeon: Kary Kos, MD;  Location: [facility];  Service: Neurosurgery;  Laterality: N/A;  . BACK SURGERY    . BRAIN SURGERY     VP shunt  . BREAST REDUCTION SURGERY Bilateral 06/07/2013   Procedure: BILATERAL MAMMARY REDUCTION  (BREAST);  Surgeon:  Claire Sanger, DO;  Location: [facility];  Service: Plastics;  Laterality: Bilateral;  . BREAST REDUCTION SURGERY Bilateral 06/07/2013   Procedure:  LIPOSUCTION;  Surgeon: Theodoro Kos, DO;  Location: [facility];  Service: Plastics;  Laterality: Bilateral;  . CARPAL TUNNEL RELEASE Bilateral 2001  . COLONOSCOPY  X 2   "polyps were benign"  . DILATION AND CURETTAGE OF UTERUS    . ESOPHAGOGASTRODUODENOSCOPY    . gastric banding undone  11/2014  . LAPAROSCOPIC CHOLECYSTECTOMY  ~ 2000  . LAPAROSCOPIC GASTRIC BANDING  2011  . LAPAROSCOPIC REVISION VENTRICULAR-PERITONEAL (V-P) SHUNT N/A 06/23/2017   Procedure: Shunt Placment stereotactic ventricular catheter placement and general surgery abdominal exposure  (Laparoscopic vs. Open) Brain Lab;  Surgeon: Clovis Riley, MD;  Location: [facility];  Service: General;  Laterality: N/A;  . LAPAROSCOPY N/A 06/23/2017   Procedure: LAPAROSCOPY DIAGNOSTIC;  Surgeon: Clovis Riley, MD;  Location: [facility];  Service: General;  Laterality: N/A;  . LUMBAR LAMINECTOMY/DECOMPRESSION MICRODISCECTOMY N/A 07/23/2016   Procedure: Decompressive Lumbar Laminectomy Lumbar two-three  for Repair of  Cerberal Spinal Fluid  Leak;  Surgeon: Kary Kos, MD;  Location: [facility];  Service: Neurosurgery;  Laterality: N/A;  . LUMBAR LAMINECTOMY/DECOMPRESSION MICRODISCECTOMY Left 03/01/2019   Procedure: LEFT LUMBAR THREE-FOUR EXTRAFORAMINAL MICRODISCECTOMY;  Surgeon: Kary Kos, MD;  Location: [facility];  Service: Neurosurgery;  Laterality: Left;  . PLACEMENT LUMBOPERITONEAL SHUNT FOR CEREBROSPINAL FLUID DIVERSION AWAY FROM C8 PERINEURAL CYST  09-05-2003  &  12-24-2003   08-31-2003  REMOVAL SHUNT  . POSTERIOR CERVICAL LAMINECTOMY  03-01-2001   C7-8 and Exploration C8 perineural cyst  . POSTERIOR LUMBAR LAMINECTOMY L4-5/  DISKECTOMY L5-S1  05-20-2005  . RE-DO DECOMPRESSION LAMINECTOMY AND FUSION L4-5  &  L5-S1  12-20-2006   02-04-2007--  RE-EXPLORATION L4 to S1, I & D LUMBAR WOUND HEMATOMA  . REDUCTION MAMMAPLASTY    . SHUNT REMOVAL N/A 07/10/2016   Procedure: SHUNT REMOVAL;  Surgeon: Kary Kos, MD;  Location: [facility];  Service: Neurosurgery;  Laterality: N/A;  SHUNT REMOVAL  . SPINAL CORD STIMULATOR INSERTION N/A 04/26/2015   Procedure: LUMBAR SPINAL CORD STIMULATOR INSERTION;  Surgeon: Clydell Hakim, MD;  Location: [facility];  Service: Neurosurgery;  Laterality: N/A;  LUMBAR SPINAL CORD STIMULATOR INSERTION  . SPINAL CORD STIMULATOR REMOVAL N/A 06/28/2019   Procedure: Removal of dorsal column stimulator and generator -;  Surgeon: Kary Kos, MD;  Location: [place];  Service: Neurosurgery;  Laterality: N/A;  . TENNIS ELBOW RELEASE/NIRSCHEL PROCEDURE Left 12/13/2014   Procedure: LEFT ELBOW LATERAL EPICONDYLAR DEBRIDEMENT AND OR REPAIR -RECONSTRUCTION ;   Surgeon: Iran Planas, MD;  Location: [facility];  Service: Orthopedics;  Laterality: Left;  . TOTAL ABDOMINAL HYSTERECTOMY  1994   w/  Bilateral Salpinoophorectomy  . VENTRICULOPERITONEAL SHUNT N/A 06/23/2017   Procedure: VENTRICULAR-PERITONEAL SHUNT INSERTION WITH ABDOMINAL LAPARASCOPY AND BRAINLAB NAVIGATION;  Surgeon: Kary Kos, MD;  Location: [facility];  Service: Neurosurgery;  Laterality: N/A;    Social History   Socioeconomic History  . Marital status: Divorced    Spouse name: Not on file  . Number of children: 2  . Years of education: Assoc  . Highest education level: Associate degree: academic program  Occupational History  . Occupation: Disabled   Tobacco Use  . Smoking status: Never Smoker  . Smokeless tobacco: Never Used  Vaping Use  . Vaping Use: Never used  Substance and Sexual Activity  . Alcohol use: Yes    Comment:  once a year  . Drug use: No  . Sexual activity: Not Currently    Birth control/protection: Surgical  Other Topics Concern  . Not on file  Social History Narrative   Caffeine: occasionally    Mrs Henault is divorced, on permanent disability and lives at home with her mother and her daughter. She is independent in her care needs. She denies concern with transportation to medical appointments She is on a fixed income   Social Determinants of Health   Financial Resource Strain: Low Risk   . Difficulty of Paying Living Expenses: Not very hard  Food Insecurity: Food Insecurity Present  . Worried About Charity fundraiser in the Last Year: Sometimes true  . Ran Out of Food in the Last Year: Never true  Transportation Needs: No Transportation Needs  . Lack of Transportation (Medical): No  . Lack of Transportation (Non-Medical): No  Physical Activity: Inactive  . Days of Exercise per Week: 0 days  . Minutes of Exercise per Session: 0 min  Stress: Stress Concern Present  . Feeling of Stress : To some extent  Social Connections: Socially  Isolated  . Frequency of Communication with Friends and Family: More than three times a week  . Frequency of Social Gatherings with Friends and Family: Once a week  . Attends Religious Services: Never  . Active Member of Clubs or Organizations: No  . Attends Archivist Meetings: Never  . Marital Status: Divorced    Family History  Problem Relation Age of Onset  . Depression Other   . Colon cancer Father   . Ovarian cancer Maternal Grandmother   . Pancreatic cancer Maternal Grandfather     Health Maintenance  Topic Date Due  . Zoster Vaccines- Shingrix (1 of 2) Never done  . COVID-19 Vaccine (3 - Booster) 04/08/2020  . TETANUS/TDAP  07/24/2021 (Originally 05/19/2019)  . Hepatitis C Screening  07/24/2021 (Originally 08/23/1985)  . INFLUENZA VACCINE  12/16/2020  . MAMMOGRAM  07/27/2022  . COLONOSCOPY (Pts 45-3yrs Insurance coverage will need to be confirmed)  07/05/2023  . HIV Screening  Completed  . HPV VACCINES  Aged Out     ----------------------------------------------------------------------------------------------------------------------------------------------------------------------------------------------------------------- Physical Exam BP (!) 144/86 (BP Location: Left Arm, Patient Position: Sitting, Cuff Size: Normal)   Pulse (!) 105   Temp 97.7 F (36.5 C)   Ht 5\' 3"  (1.6 m)   Wt 179 lb (81.2 kg)   SpO2 99%   BMI 31.71 kg/m   Physical Exam Constitutional:      Appearance: Normal appearance.  Eyes:     General: No scleral icterus. Cardiovascular:     Rate and Rhythm: Normal rate and regular rhythm.  Pulmonary:     Effort: Pulmonary effort is normal.     Breath sounds: Normal breath sounds.  Abdominal:     General: There is no distension.     Palpations: Abdomen is soft.     Tenderness: There is abdominal tenderness (R flank tenderness).  Skin:    General: Skin is warm and dry.  Neurological:     General: No focal deficit present.     Mental  Status: She is alert.  Psychiatric:        Mood and Affect: Mood normal.        Behavior: Behavior normal.     ------------------------------------------------------------------------------------------------------------------------------------------------------------------------------------------------------------------- Assessment and Plan  Right flank pain Symptoms and exam suspicious for stone.  She has large blood and protein in her urine.  History of stones with  obstruction and with high suspicion recurrent stone.  Previous stones have not shown up on KUB.  Will proceed with stat CT scan of the abdomen and pelvis w/o contrast.  She is followed by pain management and is on chronic opioids for pain control.      No orders of the defined types were placed in this encounter.   No follow-ups on file.    This visit occurred during the SARS-CoV-2 public health emergency.  Safety protocols were in place, including screening questions prior to the visit, additional usage of staff PPE, and extensive cleaning of exam room while observing appropriate contact time as indicated for disinfecting solutions.

## 2020-10-17 DIAGNOSIS — N202 Calculus of kidney with calculus of ureter: Secondary | ICD-10-CM | POA: Diagnosis not present

## 2020-10-17 DIAGNOSIS — N3000 Acute cystitis without hematuria: Secondary | ICD-10-CM | POA: Diagnosis not present

## 2020-10-18 LAB — URINE CULTURE
MICRO NUMBER:: 11958867
SPECIMEN QUALITY:: ADEQUATE

## 2020-10-20 ENCOUNTER — Other Ambulatory Visit: Payer: Self-pay | Admitting: Family Medicine

## 2020-10-20 MED ORDER — SULFAMETHOXAZOLE-TRIMETHOPRIM 800-160 MG PO TABS: 1.0000 | ORAL_TABLET | Freq: Two times a day (BID) | ORAL | 0 refills | Status: DC

## 2020-10-22 ENCOUNTER — Ambulatory Visit: Payer: Medicare HMO | Admitting: Psychology

## 2020-10-24 ENCOUNTER — Encounter: Payer: Self-pay | Admitting: Family Medicine

## 2020-10-24 ENCOUNTER — Other Ambulatory Visit: Payer: Self-pay | Admitting: Family Medicine

## 2020-10-24 MED ORDER — CEPHALEXIN 500 MG PO CAPS: 500.0000 mg | ORAL_CAPSULE | Freq: Two times a day (BID) | ORAL | 0 refills | Status: AC

## 2020-11-05 ENCOUNTER — Ambulatory Visit: Payer: Medicare HMO | Admitting: Psychology

## 2020-11-11 DIAGNOSIS — M5440 Lumbago with sciatica, unspecified side: Secondary | ICD-10-CM | POA: Diagnosis not present

## 2020-11-11 DIAGNOSIS — M542 Cervicalgia: Secondary | ICD-10-CM | POA: Diagnosis not present

## 2020-11-11 DIAGNOSIS — R69 Illness, unspecified: Secondary | ICD-10-CM | POA: Diagnosis not present

## 2020-11-11 DIAGNOSIS — M7712 Lateral epicondylitis, left elbow: Secondary | ICD-10-CM | POA: Diagnosis not present

## 2020-11-11 DIAGNOSIS — M79605 Pain in left leg: Secondary | ICD-10-CM | POA: Diagnosis not present

## 2020-11-11 DIAGNOSIS — M5417 Radiculopathy, lumbosacral region: Secondary | ICD-10-CM | POA: Diagnosis not present

## 2020-11-11 DIAGNOSIS — M961 Postlaminectomy syndrome, not elsewhere classified: Secondary | ICD-10-CM | POA: Diagnosis not present

## 2020-11-11 DIAGNOSIS — Z79899 Other long term (current) drug therapy: Secondary | ICD-10-CM | POA: Diagnosis not present

## 2020-11-11 DIAGNOSIS — M7061 Trochanteric bursitis, right hip: Secondary | ICD-10-CM | POA: Diagnosis not present

## 2020-11-11 DIAGNOSIS — M79609 Pain in unspecified limb: Secondary | ICD-10-CM | POA: Diagnosis not present

## 2020-11-19 ENCOUNTER — Ambulatory Visit: Payer: Medicare HMO | Admitting: Psychology

## 2020-11-24 IMAGING — DX LEFT KNEE - COMPLETE 4+ VIEW
4 series · 4 of 4 positions shown · non-contrast
Comparison: Bilateral knee x-rays dated March 16, 2014.

CLINICAL DATA: Acute medial left knee pain after injury at the gym
yesterday.

EXAM:
RIGHT KNEE - 1-2 VIEW; LEFT KNEE - COMPLETE 4+ VIEW

[tunnel]
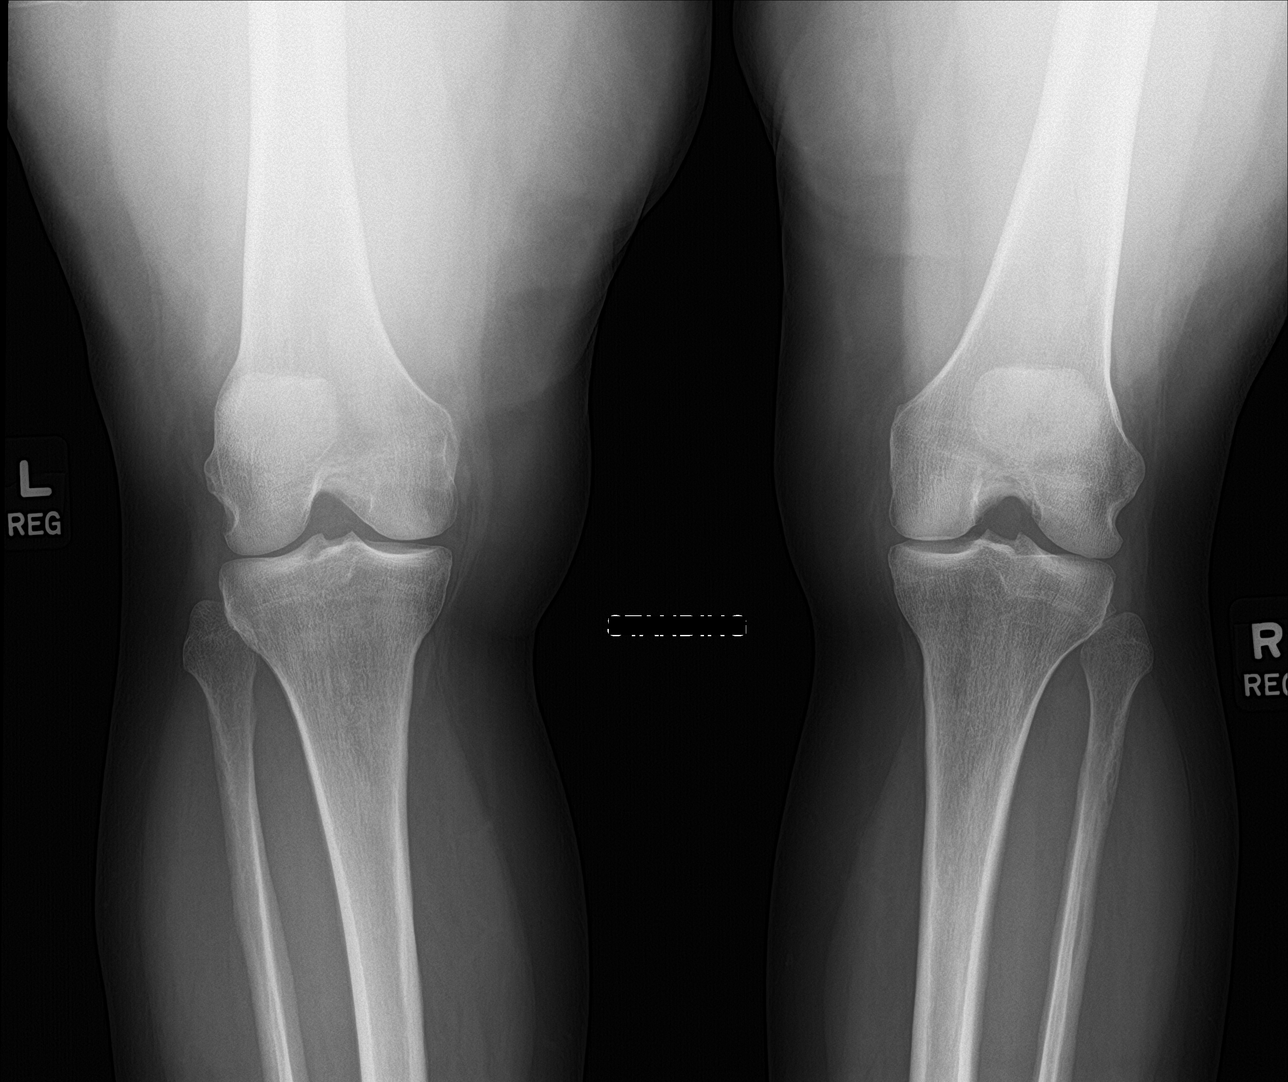

[knee lat]
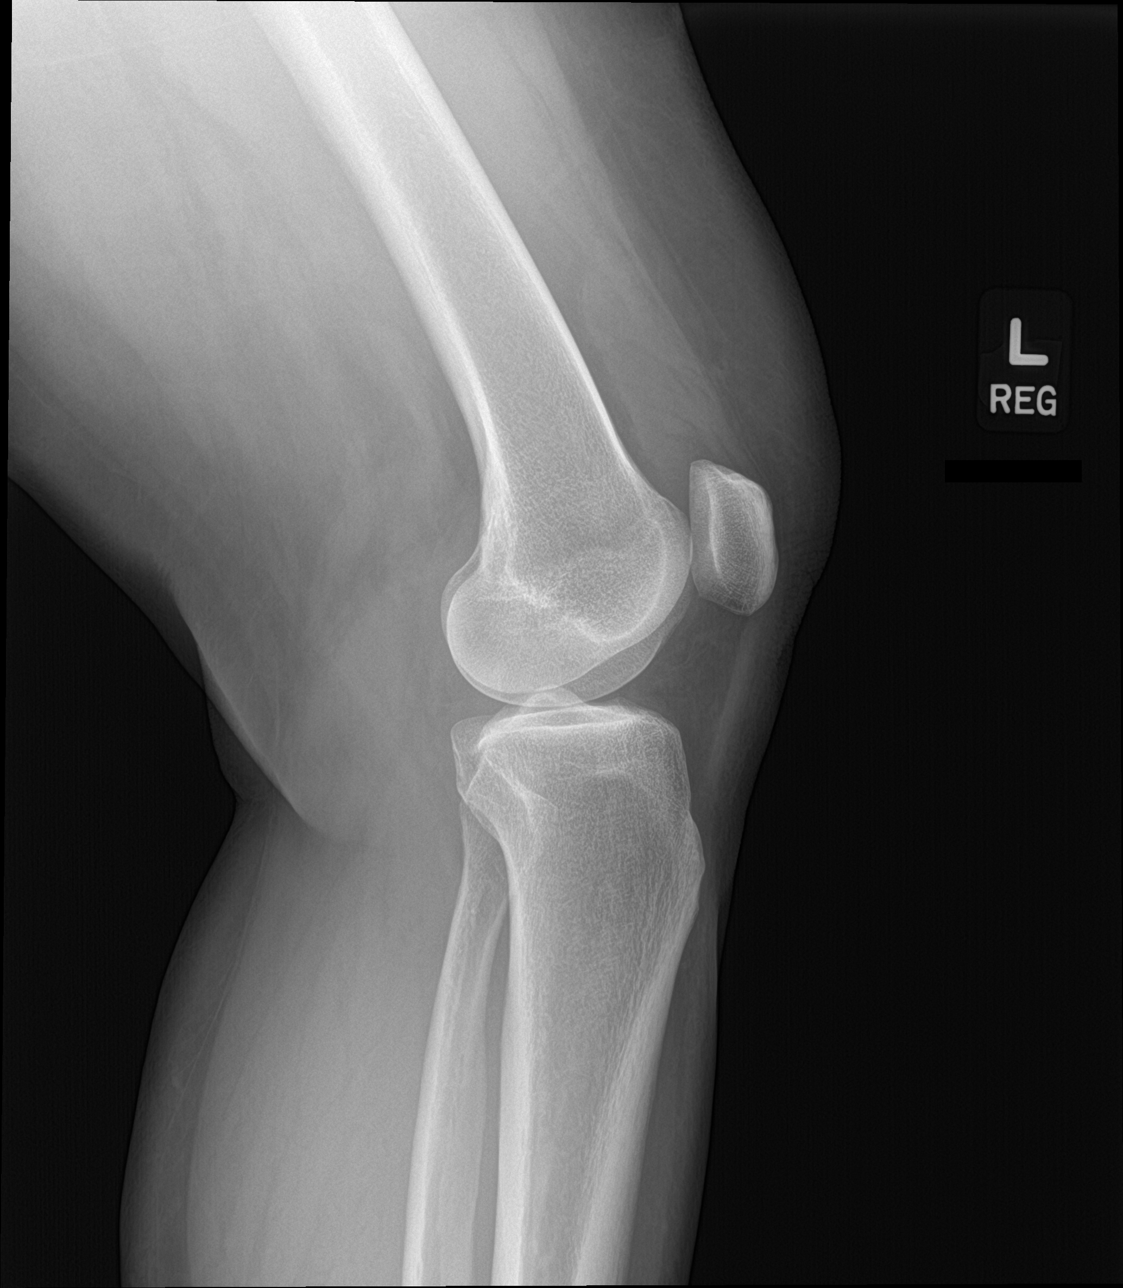

[knee sunrise]
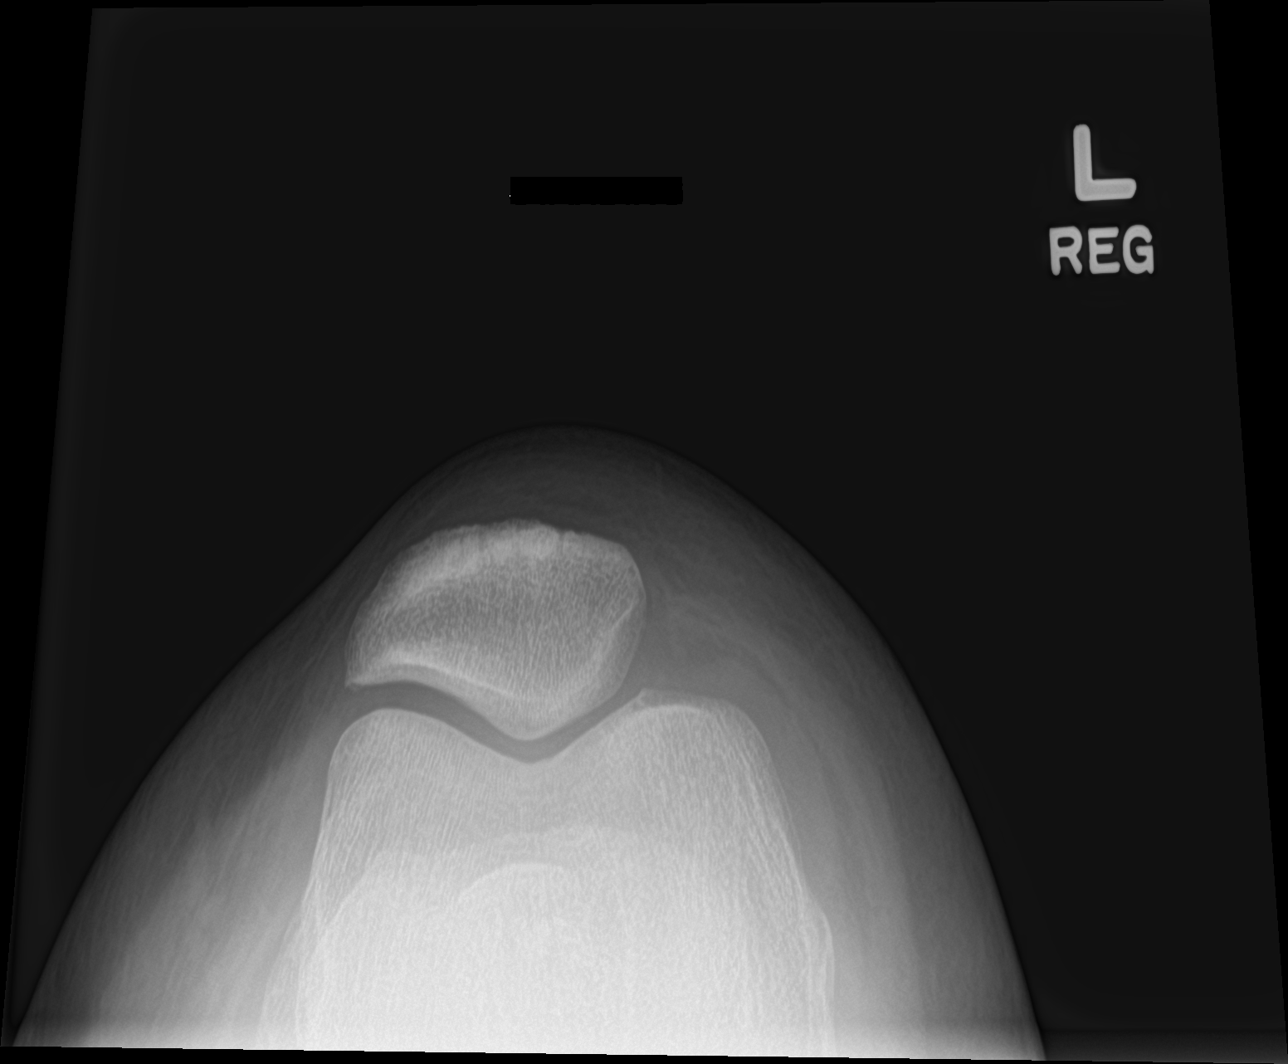

[knee ap bilat standing]
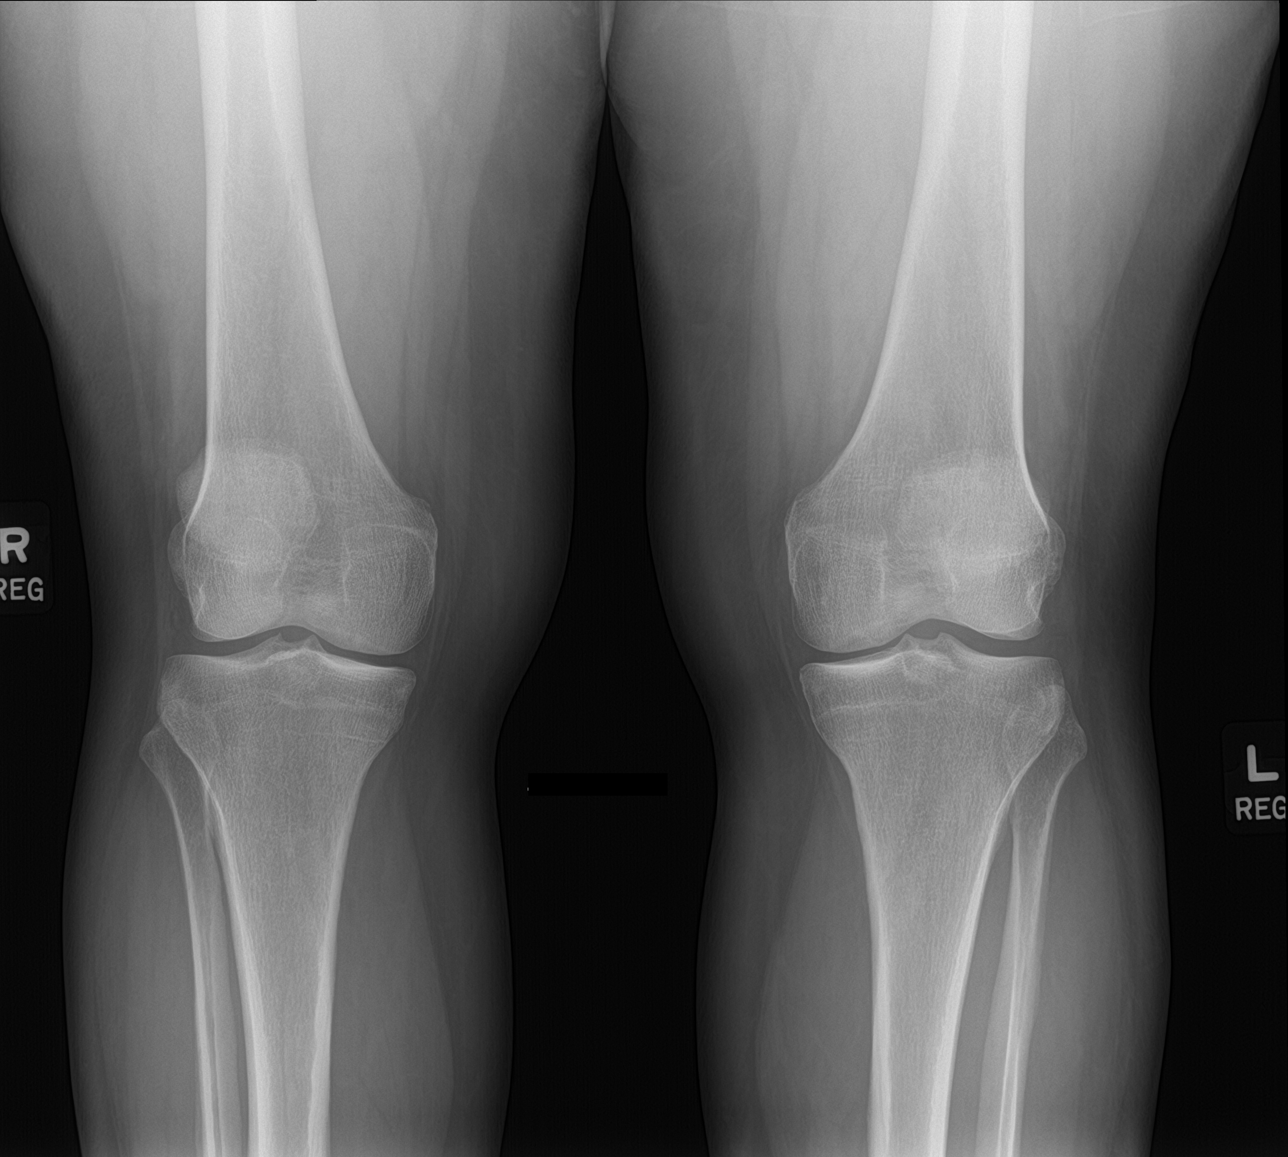

[4 of 4 positions shown; findings below may reference images not displayed]

FINDINGS: Left knee: No acute fracture or malalignment. Small to moderate
joint effusion. New mild medial compartment joint space narrowing.
Bone mineralization is normal. Soft tissues are unremarkable.

Right knee: No acute fracture or malalignment. No joint effusion.
New mild medial compartment joint space narrowing. Bone
mineralization is normal. Soft tissues are unremarkable.
IMPRESSION: 1. Small to moderate left knee joint effusion. No acute osseous
abnormality.
2. New bilateral mild medial compartment degenerative changes.

## 2020-11-26 ENCOUNTER — Encounter: Payer: Self-pay | Admitting: Family Medicine

## 2020-11-26 ENCOUNTER — Telehealth (INDEPENDENT_AMBULATORY_CARE_PROVIDER_SITE_OTHER): Payer: Medicare HMO | Admitting: Family Medicine

## 2020-11-26 DIAGNOSIS — U071 COVID-19: Secondary | ICD-10-CM

## 2020-11-26 MED ORDER — MOLNUPIRAVIR EUA 200MG CAPSULE: 4.0000 | ORAL_CAPSULE | Freq: Two times a day (BID) | ORAL | 0 refills | Status: AC

## 2020-11-26 NOTE — Progress Notes (Signed)
Virtual Video Visit via MyChart Note  I connected with  [name] on 11/26/20 at  2:00 PM EDT by the video enabled telemedicine application for MyChart, and verified that I am speaking with the correct person using two identifiers.   I introduced myself as a Designer, jewellery with the practice. We discussed the limitations of evaluation and management by telemedicine and the availability of in person appointments. The patient expressed understanding and agreed to proceed.  Participating parties in this visit include: The patient and the nurse practitioner listed.  The patient is: At home I am: In the office - [place]  Subjective:    CC:  Chief Complaint  Patient presents with   Covid Positive    HPI: Katrina Fernandez is a 55 y.o. year old female presenting today via [facility] today for COVID + home test.  Everyone at home has COVID: started with 53 year old on Thursday.  Patient started feeling bad late Saturday evening and decided to take a COVID test which was positive. She is experiencing headaches, nasal congestion, body aches, fatigue, cough that comes and goes but tends to be worse at night. She did have a fever for the first few days up to 101 F but she thinks it broke yesterday. No chest pain, dyspnea, wheezing, lethargy, GI/GU changes, dehydration, confusion.       Past medical history, Surgical history, Family history not pertinant except as noted below, Social history, Allergies, and medications have been entered into the medical record, reviewed, and corrections made.   Review of Systems:  All review of systems negative except what is listed in the HPI   Objective:    General:  Speaking clearly in complete sentences. Absent shortness of breath noted.   Alert and oriented x3.   Normal judgment.  Absent acute distress.   Impression and Recommendations:    1. COVID-19 - molnupiravir EUA 200 mg CAPS; Take 4 capsules (800 mg total) by  mouth 2 (two) times daily for 5 days.  Dispense: 40 capsule; Refill: 0  BMI > 30, eligible for antiviral treatment as she is on day 3-4 of illness. Discussed options with patient and she would like to try antiviral. Sending in molnupiravir and attaching patient education sheet link and OTC/supportive care recommendations to AVS. Patient aware of signs/symptoms requiring further/urgent evaluation.    Follow-up if symptoms worsen or fail to improve.    I discussed the assessment and treatment plan with the patient. The patient was provided an opportunity to ask questions and all were answered. The patient agreed with the plan and demonstrated an understanding of the instructions.   The patient was advised to call back or seek an in-person evaluation if the symptoms worsen or if the condition fails to improve as anticipated.  I provided 20 minutes of non-face-to-face interaction with this [facility] visit including intake, same-day documentation, and chart review.   Terrilyn Saver, NP

## 2020-11-26 NOTE — Patient Instructions (Addendum)
[email or web address]  Over the counter medications that may be helpful for symptoms:  Guaifenesin 1200 mg extended release tabs twice daily, with plenty of water For cough and congestion Brand name: Mucinex   Pseudoephedrine 30 mg, one or two tabs every 4 to 6 hours For sinus congestion Brand name: Sudafed You must get this from the pharmacy counter.  Oxymetazoline nasal spray each morning, one spray in each nostril, for NO MORE THAN 3 days  For nasal and sinus congestion Brand name: Afrin Saline nasal spray or Saline Nasal Irrigation 3-5 times a day For nasal and sinus congestion Brand names: Ocean or AYR Fluticasone nasal spray, one spray in each nostril, each morning (after oxymetazoline and saline, if used) For nasal and sinus congestion Brand name: Flonase Warm salt water gargles  For sore throat Every few hours as needed Alternate ibuprofen 400-600 mg and acetaminophen 1000 mg every 4-6 hours For fever, body aches, headache Brand names: Motrin or Advil and Tylenol Dextromethorphan 12-hour cough version 30 mg every 12 hours  For cough Brand name: Delsym Stop all other cold medications for now (Nyquil, Dayquil, Tylenol Cold, Theraflu, etc) and other non-prescription cough/cold preparations. Many of these have the same ingredients listed above and could cause an overdose of medication.   Herbal treatments that have been shown to be helpful in some patients include: Vitamin C 1000mg  per day Vitamin D 4000iU per day Zinc 100mg  per day Quercetin 25-500mg  twice a day Melatonin 5-10mg  at bedtime  General Instructions Allow your body to rest Drink PLENTY of fluids Isolate yourself from everyone, even family, until test results have returned  If your COVID-19 test is positive Then you ARE INFECTED and you can pass the virus to others You must quarantine from others for a minimum of  10 days since symptoms started AND You are fever free for 24 hours  WITHOUT any medication to reduce fever AND Your symptoms are improving Do not go to the store or other public areas Do not go around household members who are not known to be infected with COVID-19 If you MUST leave your area of quarantine (example: go to a bathroom you share with others in your home), you must Wear a mask Wash your hands thoroughly Wipe down any surfaces you touch Do not share food, drinks, towels, or other items with other persons Dispose of your own tissues, food containers, etc  Once you have recovered, please continue good preventive care measures, including:  wearing a mask when in public wash your hands frequently avoid touching your face/nose/eyes cover coughs/sneezes with the inside of your elbow stay out of crowds keep a 6 foot distance from others  If you develop severe shortness of breath, uncontrolled fevers, coughing up blood, confusion, chest pain, or signs of dehydration (such as significantly decreased urine amounts or dizziness with standing) please go to the ER.

## 2020-11-28 ENCOUNTER — Encounter: Payer: Self-pay | Admitting: Family Medicine

## 2020-11-28 ENCOUNTER — Telehealth (INDEPENDENT_AMBULATORY_CARE_PROVIDER_SITE_OTHER): Payer: Medicare HMO | Admitting: Physician Assistant

## 2020-11-28 VITALS — Temp 99.0°F | Ht 63.0 in | Wt 172.0 lb

## 2020-11-28 DIAGNOSIS — Z87442 Personal history of urinary calculi: Secondary | ICD-10-CM

## 2020-11-28 DIAGNOSIS — R399 Unspecified symptoms and signs involving the genitourinary system: Secondary | ICD-10-CM | POA: Diagnosis not present

## 2020-11-28 DIAGNOSIS — R109 Unspecified abdominal pain: Secondary | ICD-10-CM | POA: Diagnosis not present

## 2020-11-28 DIAGNOSIS — R112 Nausea with vomiting, unspecified: Secondary | ICD-10-CM

## 2020-11-28 MED ORDER — ONDANSETRON 4 MG PO TBDP: 4.0000 mg | ORAL_TABLET | Freq: Three times a day (TID) | ORAL | 0 refills | Status: DC | PRN

## 2020-11-28 MED ORDER — CEFPODOXIME PROXETIL 200 MG PO TABS: 200.0000 mg | ORAL_TABLET | Freq: Two times a day (BID) | ORAL | 0 refills | Status: AC

## 2020-11-28 NOTE — Progress Notes (Signed)
Acute Office Visit  Subjective:    Patient ID: Katrina Fernandez, female    DOB: 06-12-1965, 55 y.o.   MRN: [id number]  Chief Complaint  Patient presents with   Covid Positive    HPI   Past Medical History:  Diagnosis Date   Anemia    Anxiety    Arthritis    "back and knees" (07/23/2016)   Chiari malformation type I ([place]) dx'd 2014   Chronic back pain    buldging, DDD; "neck and lower back" (07/23/2016)   Chronic constipation    takes Miralax daily   Chronic pain syndrome    takes Methadone and Keppra daily   Depression    takes Effexor daily   Endometriosis    1995   Fibromyalgia    GERD (gastroesophageal reflux disease)    not taking anything at the present time   History of DVT of lower extremity 07/2012   LEFT LEG --  3/ 2014.Was on a blood transfusion for 6 months   History of kidney stones    History of MRSA infection 10 yrs ago   Hyperlipidemia    takes Simvastatin daily   Migraine    "daily recently" (07/23/2016)   Muscle spasm    takes Zanaflex nightly   Peripheral edema    takes Lasix daily as needed   Peripheral neuropathy    Pleurisy 2000   Pneumonia    hx of-7-8 yrs ago   PONV (postoperative nausea and vomiting)    Seasonal allergies    takes Claritin daily   Spinal headache    "since 2002; still get them; try to manage it w/lying flat"    Past Surgical History:  Procedure Laterality Date   ANTERIOR CERVICAL DECOMP/DISCECTOMY FUSION  07-25-2009   C5 -- C6   APPENDECTOMY  age 62   APPLICATION OF CRANIAL NAVIGATION N/A 06/23/2017   Procedure: APPLICATION OF CRANIAL NAVIGATION;  Surgeon: Kary Kos, MD;  Location: [place];  Service: Neurosurgery;  Laterality: N/A;   BACK SURGERY     BRAIN SURGERY     VP shunt   BREAST REDUCTION SURGERY Bilateral 06/07/2013   Procedure: BILATERAL MAMMARY REDUCTION  (BREAST);  Surgeon: Theodoro Kos, DO;  Location: [place];  Service: Plastics;  Laterality: Bilateral;   BREAST REDUCTION SURGERY  Bilateral 06/07/2013   Procedure:  LIPOSUCTION;  Surgeon: Theodoro Kos, DO;  Location: [place];  Service: Plastics;  Laterality: Bilateral;   CARPAL TUNNEL RELEASE Bilateral 2001   COLONOSCOPY  X 2   "polyps were benign"   DILATION AND CURETTAGE OF UTERUS     ESOPHAGOGASTRODUODENOSCOPY     gastric banding undone  11/2014   LAPAROSCOPIC CHOLECYSTECTOMY  ~ 2000   LAPAROSCOPIC GASTRIC BANDING  2011   LAPAROSCOPIC REVISION VENTRICULAR-PERITONEAL (V-P) SHUNT N/A 06/23/2017   Procedure: Shunt Placment stereotactic ventricular catheter placement and general surgery abdominal exposure  (Laparoscopic vs. Open) Brain Lab;  Surgeon: Clovis Riley, MD;  Location: [place];  Service: General;  Laterality: N/A;   LAPAROSCOPY N/A 06/23/2017   Procedure: LAPAROSCOPY DIAGNOSTIC;  Surgeon: Clovis Riley, MD;  Location: [place];  Service: General;  Laterality: N/A;   LUMBAR LAMINECTOMY/DECOMPRESSION MICRODISCECTOMY N/A 07/23/2016   Procedure: Decompressive Lumbar Laminectomy Lumbar two-three  for Repair of  Cerberal Spinal Fluid  Leak;  Surgeon: Kary Kos, MD;  Location: [place];  Service: Neurosurgery;  Laterality: N/A;   LUMBAR LAMINECTOMY/DECOMPRESSION MICRODISCECTOMY Left 03/01/2019   Procedure: LEFT LUMBAR THREE-FOUR EXTRAFORAMINAL  MICRODISCECTOMY;  Surgeon: Kary Kos, MD;  Location: [place];  Service: Neurosurgery;  Laterality: Left;   PLACEMENT LUMBOPERITONEAL SHUNT FOR CEREBROSPINAL FLUID DIVERSION AWAY FROM C8 PERINEURAL CYST  09-05-2003  &  12-24-2003   08-31-2003  REMOVAL SHUNT   POSTERIOR CERVICAL LAMINECTOMY  03-01-2001   C7-8 and Exploration C8 perineural cyst   POSTERIOR LUMBAR LAMINECTOMY L4-5/  DISKECTOMY L5-S1  05-20-2005   RE-DO DECOMPRESSION LAMINECTOMY AND FUSION L4-5  &  L5-S1  12-20-2006   02-04-2007--  RE-EXPLORATION L4 to S1, I & D LUMBAR WOUND HEMATOMA   REDUCTION MAMMAPLASTY     SHUNT REMOVAL N/A 07/10/2016   Procedure: SHUNT REMOVAL;  Surgeon: Kary Kos, MD;  Location:  [place];  Service: Neurosurgery;  Laterality: N/A;  SHUNT REMOVAL   SPINAL CORD STIMULATOR INSERTION N/A 04/26/2015   Procedure: LUMBAR SPINAL CORD STIMULATOR INSERTION;  Surgeon: Clydell Hakim, MD;  Location: [place] [facility];  Service: Neurosurgery;  Laterality: N/A;  LUMBAR SPINAL CORD STIMULATOR INSERTION   SPINAL CORD STIMULATOR REMOVAL N/A 06/28/2019   Procedure: Removal of dorsal column stimulator and generator -;  Surgeon: Kary Kos, MD;  Location: [place];  Service: Neurosurgery;  Laterality: N/A;   TENNIS ELBOW RELEASE/NIRSCHEL PROCEDURE Left 12/13/2014   Procedure: LEFT ELBOW LATERAL EPICONDYLAR DEBRIDEMENT AND OR REPAIR -RECONSTRUCTION ;  Surgeon: Iran Planas, MD;  Location: [place];  Service: Orthopedics;  Laterality: Left;   TOTAL ABDOMINAL HYSTERECTOMY  1994   w/  Bilateral Salpinoophorectomy   VENTRICULOPERITONEAL SHUNT N/A 06/23/2017   Procedure: VENTRICULAR-PERITONEAL SHUNT INSERTION WITH ABDOMINAL LAPARASCOPY AND BRAINLAB NAVIGATION;  Surgeon: Kary Kos, MD;  Location: [place];  Service: Neurosurgery;  Laterality: N/A;    Family History  Problem Relation Age of Onset   Depression Other    Colon cancer Father    Ovarian cancer Maternal Grandmother    Pancreatic cancer Maternal Grandfather     Social History   Socioeconomic History   Marital status: Divorced    Spouse name: Not on file   Number of children: 2   Years of education: Assoc   Highest education level: Associate degree: academic program  Occupational History   Occupation: Disabled   Tobacco Use   Smoking status: Never   Smokeless tobacco: Never  Scientific laboratory technician Use: Never used  Substance and Sexual Activity   Alcohol use: Yes    Comment: once a year   Drug use: No   Sexual activity: Not Currently    Birth control/protection: Surgical  Other Topics Concern   Not on file  Social History Narrative   Caffeine: occasionally    Katrina Fernandez is divorced, on permanent disability and lives at  home with her mother and her daughter. She is independent in her care needs. She denies concern with transportation to medical appointments She is on a fixed income   Social Determinants of Radio broadcast assistant Strain: Low Risk    Difficulty of Paying Living Expenses: Not very hard  Food Insecurity: Food Insecurity Present   Worried About Charity fundraiser in the Last Year: Sometimes true   Arboriculturist in the Last Year: Never true  Transportation Needs: No Transportation Needs   Lack of Transportation (Medical): No   Lack of Transportation (Non-Medical): No  Physical Activity: Inactive   Days of Exercise per Week: 0 days   Minutes of Exercise per Session: 0 min  Stress: Stress Concern Present   Feeling of Stress : To  some extent  Social Connections: Socially Isolated   Frequency of Communication with Friends and Family: More than three times a week   Frequency of Social Gatherings with Friends and Family: Once a week   Attends Religious Services: Never   Marine scientist or Organizations: No   Attends Music therapist: Never   Marital Status: Divorced  Human resources officer Violence: Not At Risk   Fear of Current or Ex-Partner: No   Emotionally Abused: No   Physically Abused: No   Sexually Abused: No    Outpatient Medications Prior to Visit  Medication Sig Dispense Refill   DULoxetine (CYMBALTA) 60 MG capsule TAKE 2 CAPSULES BY MOUTH EVERY DAY 180 capsule 1   methadone (DOLOPHINE) 5 MG tablet Take 2.5 mg by mouth daily.   0   methocarbamol (ROBAXIN) 500 MG tablet Take 500 mg by mouth every 6 (six) hours as needed for muscle spasms.     molnupiravir EUA 200 mg CAPS Take 4 capsules (800 mg total) by mouth 2 (two) times daily for 5 days. 40 capsule 0   montelukast (SINGULAIR) 10 MG tablet TAKE 1 TABLET BY MOUTH EVERYDAY AT BEDTIME 90 tablet 1   NARCAN 4 MG/0.1ML LIQD nasal spray kit Place 0.4 mg into the nose once.      OZEMPIC, 1 MG/DOSE, 2 MG/1.5ML  SOPN INJECT 1 MG INTO THE SKIN ONCE A WEEK. 12 pen 1   polyethylene glycol (MIRALAX / GLYCOLAX) 17 g packet Take 17 g by mouth daily as needed for mild constipation or moderate constipation.     pramipexole (MIRAPEX) 0.5 MG tablet TAKE 1 TABLET BY MOUTH AT BEDTIME. 90 tablet 3   simvastatin (ZOCOR) 20 MG tablet TAKE 1 TABLET BY MOUTH EVERY DAY 90 tablet 1   tamsulosin (FLOMAX) 0.4 MG CAPS capsule Take 1 capsule (0.4 mg total) by mouth daily. 30 capsule 0   traMADol (ULTRAM) 50 MG tablet Take 50-100 mg by mouth every 8 (eight) hours as needed (Pain).      No facility-administered medications prior to visit.    Allergies  Allergen Reactions   Penicillins Anaphylaxis    Has patient had a PCN reaction causing immediate rash, facial/tongue/throat swelling, SOB or lightheadedness with hypotension: Yes Has patient had a PCN reaction causing severe rash involving mucus membranes or skin necrosis: No Has patient had a PCN reaction that required hospitalization No Has patient had a PCN reaction occurring within the last 10 years: No If all of the above answers are "NO", then may proceed with Cephalosporin use.    Tape Rash    Plastic tape, silicone tape, Steri Strips   Lactose Nausea And Vomiting and Swelling        Chlorhexidine Rash and Other (See Comments)    Dermatitis - Allergic    Review of Systems     Objective:    Physical Exam  Temp 99 F (37.2 C) (Oral)   Ht _0  (1.6 m)   Wt 172 lb (78 kg)   BMI 30.47 kg/m  Wt Readings from Last 3 Encounters:  11/28/20 172 lb (78 kg)  10/16/20 179 lb (81.2 kg)  09/11/20 180 lb (81.6 kg)    Health Maintenance Due  Topic Date Due   Pneumococcal Vaccine 42-18 Years old (1 - PCV) Never done   Zoster Vaccines- Shingrix (1 of 2) Never done   COVID-19 Vaccine (3 - Mixed Product risk series) 12/05/2019    There are no preventive care reminders to  display for this patient.   Lab Results  Component Value Date   TSH 0.73 11/24/2019    Lab Results  Component Value Date   WBC 8.4 11/24/2019   HGB 13.6 11/24/2019   HCT 43.3 11/24/2019   MCV 85.7 11/24/2019   PLT 253 11/24/2019   Lab Results  Component Value Date   NA 142 11/24/2019   K 4.9 11/24/2019   CO2 32 11/24/2019   GLUCOSE 91 11/24/2019   BUN 14 11/24/2019   CREATININE 0.72 11/24/2019   BILITOT 0.6 11/24/2019   ALKPHOS 117 06/24/2018   AST 12 11/24/2019   ALT 8 11/24/2019   PROT 6.4 11/24/2019   ALBUMIN 4.1 10/01/2016   CALCIUM 9.5 11/24/2019   ANIONGAP 12 06/29/2019   Lab Results  Component Value Date   CHOL 187 11/24/2019   Lab Results  Component Value Date   HDL 69 11/24/2019   Lab Results  Component Value Date   LDLCALC 97 11/24/2019   Lab Results  Component Value Date   TRIG 110 11/24/2019   Lab Results  Component Value Date   CHOLHDL 2.7 11/24/2019   Lab Results  Component Value Date   HGBA1C 5.2 06/26/2019       Assessment & Plan:   Problem List Items Addressed This Visit   None    No orders of the defined types were placed in this encounter.    Trixie Dredge, [place]

## 2020-11-28 NOTE — Progress Notes (Signed)
Telemedicine Visit via  Video & Audio (App used: Chartered loss adjuster) Audio only - telephone (patient preference /  technical difficulty with MyChart video application)  I connected with Katrina Fernandez on 11/29/20 at 9:16 AM  by phone or  telemedicine application as noted above  I verified that I am speaking with or regarding  the correct patient using two identifiers.  Participants: Myself, Nelson Chimes PA-C Patient: Katrina Fernandez   Patient is at home in [facility] I am in office at [employer]    I discussed the limitations of evaluation and management  by telemedicine and the availability of in person appointments.  The participant(s) above expressed understanding and  agreed to proceed with this appointment via telemedicine.       History of Present Illness: Katrina Fernandez is a 55 y.o. female who would like to discuss recurrent urinary symptoms and flank pain   She is currently on day 5 of COVID-19 infection Reports fever Tmax of 101F yesterday - still having intermittent myalgias and headaches, cough and sore throat She was started on Paxlovid by NP 2 days ago, but had intolerable nausea and vomting after 2-3 doses  She is concerned that she could have a UTI She endorses recurrent urinary urgency/frequency and bilateral flank pain ongoing  Symptoms are worse at night Reports urinating every 2 hours despite decreased PO intake - waking up from sleep to void Denies hematuria, dysuria, or odor Endorses nausea/vomiting that only occurred after taking Paxlovid, and resolved with discontinuation - last dose of Paxlovid was yesterday 11/27/20  She underwent CT stone study on 6/01 that showed multiple calculi in the bladder and 5 x 4 mm right UV junction with mild hydronephrosis She had a positive urine culture on 6/02 - 50-100K proteus resistant to Macrobid only Initially treated with Bactrim but had intolerable nausea She was switched to Keflex and completed 7  days She states all urinary symptoms resolved with ABX except for the bilateral flank pain She had a follow-up appt with Urology but had to cancel it due to COVID      Observations/Objective: Temp 99 F (37.2 C) (Oral)   Ht _0  (1.6 m)   Wt 172 lb (78 kg)   BMI 30.47 kg/m  BP Readings from Last 3 Encounters:  10/16/20 (!) 144/86  09/11/20 139/85  09/02/20 (!) 148/96   Exam limited by telehealth Gen: alert, appears fatigue, non-toxic, no distress Pulm: normal work of breathing Psych: cooperative, normal thought content, normal behavior  Lab and Radiology Results No results found for this or any previous visit (from the past 72 hour(s)). No results found.     Assessment and Plan: 55 y.o. female with The primary encounter diagnosis was Urinary tract infection symptoms. Diagnoses of Non-intractable vomiting with nausea, unspecified vomiting type and Flank pain with history of urolithiasis were also pertinent to this visit.  Personally reviewed urine culture dated 10/16/20 Personallyy reviewed CT stone report Given patient's hx of renal calculi and positive urine cx 6 weeks ago, will treat empirically for complicated UTI Unclear if fever/nausea/vomiting is due to COVID infection or UTI Will treat with Cefpodixime 200 mg bid x 10 days - patient has tolerated Cephalosporins and had Keflex in the last 3 months Zofran 4 mg PO Q8H prn nausea/vomiting - can alternatively take 30 mins prior to ABX to prevent GI upset Counseled on ER precautions Advised to contact Urology today and reschedule appt after completing 10 days of isolation for COVID  PDMP  not reviewed this encounter. No orders of the defined types were placed in this encounter.  Meds ordered this encounter  Medications   cefpodoxime (VANTIN) 200 MG tablet    Sig: Take 1 tablet (200 mg total) by mouth 2 (two) times daily for 10 days.    Dispense:  20 tablet    Refill:  0    Order Specific Question:   Supervising  Provider    Answer:   Emeterio Reeve [[id number]]   ondansetron (ZOFRAN-ODT) 4 MG disintegrating tablet    Sig: Take 1 tablet (4 mg total) by mouth every 8 (eight) hours as needed for nausea or vomiting.    Dispense:  10 tablet    Refill:  0    Order Specific Question:   Supervising Provider    Answer:   Emeterio Reeve [[id number]]   There are no Patient Instructions on file for this visit.  Instructions sent via MyChart.   Follow Up Instructions: No follow-ups on file.    I discussed the assessment and treatment plan with the patient. The patient was provided an opportunity to ask questions and all were answered. The patient agreed with the plan and demonstrated an understanding of the instructions.   The patient was advised to call back or seek an in-person evaluation if any new concerns, if symptoms worsen or if the condition fails to improve as anticipated.  22 minutes of non-face-to-face time was provided during this encounter.      . . . . . . . . . . . . . Marland Kitchen                   Historical information moved to improve visibility of documentation.  Past Medical History:  Diagnosis Date   Anemia    Anxiety    Arthritis    "back and knees" (07/23/2016)   Chiari malformation type I ([place]) dx'd 2014   Chronic back pain    buldging, DDD; "neck and lower back" (07/23/2016)   Chronic constipation    takes Miralax daily   Chronic pain syndrome    takes Methadone and Keppra daily   Depression    takes Effexor daily   Endometriosis    1995   Fibromyalgia    GERD (gastroesophageal reflux disease)    not taking anything at the present time   History of DVT of lower extremity 07/2012   LEFT LEG --  3/ 2014.Was on a blood transfusion for 6 months   History of kidney stones    History of MRSA infection 10 yrs ago   Hyperlipidemia    takes Simvastatin daily   Migraine    "daily recently" (07/23/2016)   Muscle spasm    takes Zanaflex nightly    Peripheral edema    takes Lasix daily as needed   Peripheral neuropathy    Pleurisy 2000   Pneumonia    hx of-7-8 yrs ago   PONV (postoperative nausea and vomiting)    Seasonal allergies    takes Claritin daily   Spinal headache    "since 2002; still get them; try to manage it w/lying flat"   Past Surgical History:  Procedure Laterality Date   ANTERIOR CERVICAL DECOMP/DISCECTOMY FUSION  07-25-2009   C5 -- C6   APPENDECTOMY  age 44   APPLICATION OF CRANIAL NAVIGATION N/A 06/23/2017   Procedure: APPLICATION OF CRANIAL NAVIGATION;  Surgeon: Kary Kos, MD;  Location: [place];  Service: Neurosurgery;  Laterality: N/A;   BACK SURGERY  BRAIN SURGERY     VP shunt   BREAST REDUCTION SURGERY Bilateral 06/07/2013   Procedure: BILATERAL MAMMARY REDUCTION  (BREAST);  Surgeon: Theodoro Kos, DO;  Location: [place];  Service: Plastics;  Laterality: Bilateral;   BREAST REDUCTION SURGERY Bilateral 06/07/2013   Procedure:  LIPOSUCTION;  Surgeon: Theodoro Kos, DO;  Location: [place];  Service: Plastics;  Laterality: Bilateral;   CARPAL TUNNEL RELEASE Bilateral 2001   COLONOSCOPY  X 2   "polyps were benign"   DILATION AND CURETTAGE OF UTERUS     ESOPHAGOGASTRODUODENOSCOPY     gastric banding undone  11/2014   LAPAROSCOPIC CHOLECYSTECTOMY  ~ 2000   LAPAROSCOPIC GASTRIC BANDING  2011   LAPAROSCOPIC REVISION VENTRICULAR-PERITONEAL (V-P) SHUNT N/A 06/23/2017   Procedure: Shunt Placment stereotactic ventricular catheter placement and general surgery abdominal exposure  (Laparoscopic vs. Open) Brain Lab;  Surgeon: Clovis Riley, MD;  Location: [place];  Service: General;  Laterality: N/A;   LAPAROSCOPY N/A 06/23/2017   Procedure: LAPAROSCOPY DIAGNOSTIC;  Surgeon: Clovis Riley, MD;  Location: [place];  Service: General;  Laterality: N/A;   LUMBAR LAMINECTOMY/DECOMPRESSION MICRODISCECTOMY N/A 07/23/2016   Procedure: Decompressive Lumbar Laminectomy Lumbar two-three  for  Repair of  Cerberal Spinal Fluid  Leak;  Surgeon: Kary Kos, MD;  Location: [place];  Service: Neurosurgery;  Laterality: N/A;   LUMBAR LAMINECTOMY/DECOMPRESSION MICRODISCECTOMY Left 03/01/2019   Procedure: LEFT LUMBAR THREE-FOUR EXTRAFORAMINAL MICRODISCECTOMY;  Surgeon: Kary Kos, MD;  Location: [place];  Service: Neurosurgery;  Laterality: Left;   PLACEMENT LUMBOPERITONEAL SHUNT FOR CEREBROSPINAL FLUID DIVERSION AWAY FROM C8 PERINEURAL CYST  09-05-2003  &  12-24-2003   08-31-2003  REMOVAL SHUNT   POSTERIOR CERVICAL LAMINECTOMY  03-01-2001   C7-8 and Exploration C8 perineural cyst   POSTERIOR LUMBAR LAMINECTOMY L4-5/  DISKECTOMY L5-S1  05-20-2005   RE-DO DECOMPRESSION LAMINECTOMY AND FUSION L4-5  &  L5-S1  12-20-2006   02-04-2007--  RE-EXPLORATION L4 to S1, I & D LUMBAR WOUND HEMATOMA   REDUCTION MAMMAPLASTY     SHUNT REMOVAL N/A 07/10/2016   Procedure: SHUNT REMOVAL;  Surgeon: Kary Kos, MD;  Location: [place];  Service: Neurosurgery;  Laterality: N/A;  SHUNT REMOVAL   SPINAL CORD STIMULATOR INSERTION N/A 04/26/2015   Procedure: LUMBAR SPINAL CORD STIMULATOR INSERTION;  Surgeon: Clydell Hakim, MD;  Location: [place] [facility];  Service: Neurosurgery;  Laterality: N/A;  LUMBAR SPINAL CORD STIMULATOR INSERTION   SPINAL CORD STIMULATOR REMOVAL N/A 06/28/2019   Procedure: Removal of dorsal column stimulator and generator -;  Surgeon: Kary Kos, MD;  Location: [place];  Service: Neurosurgery;  Laterality: N/A;   TENNIS ELBOW RELEASE/NIRSCHEL PROCEDURE Left 12/13/2014   Procedure: LEFT ELBOW LATERAL EPICONDYLAR DEBRIDEMENT AND OR REPAIR -RECONSTRUCTION ;  Surgeon: Iran Planas, MD;  Location: [place];  Service: Orthopedics;  Laterality: Left;   TOTAL ABDOMINAL HYSTERECTOMY  1994   w/  Bilateral Salpinoophorectomy   VENTRICULOPERITONEAL SHUNT N/A 06/23/2017   Procedure: VENTRICULAR-PERITONEAL SHUNT INSERTION WITH ABDOMINAL LAPARASCOPY AND BRAINLAB NAVIGATION;  Surgeon: Kary Kos, MD;  Location: [place];  Service: Neurosurgery;  Laterality: N/A;   Social History   Tobacco Use   Smoking status: Never   Smokeless tobacco: Never  Substance Use Topics   Alcohol use: Yes    Comment: once a year   family history includes Colon cancer in her father; Depression in an other family member; Ovarian cancer in her maternal grandmother; Pancreatic cancer in her maternal grandfather.  Medications:  Current Outpatient Medications  Medication Sig Dispense Refill   cefpodoxime (VANTIN) 200 MG tablet Take 1 tablet (200 mg total) by mouth 2 (two) times daily for 10 days. 20 tablet 0   DULoxetine (CYMBALTA) 60 MG capsule TAKE 2 CAPSULES BY MOUTH EVERY DAY 180 capsule 1   methadone (DOLOPHINE) 5 MG tablet Take 2.5 mg by mouth daily.   0   methocarbamol (ROBAXIN) 500 MG tablet Take 500 mg by mouth every 6 (six) hours as needed for muscle spasms.     molnupiravir EUA 200 mg CAPS Take 4 capsules (800 mg total) by mouth 2 (two) times daily for 5 days. 40 capsule 0   montelukast (SINGULAIR) 10 MG tablet TAKE 1 TABLET BY MOUTH EVERYDAY AT BEDTIME 90 tablet 1   NARCAN 4 MG/0.1ML LIQD nasal spray kit Place 0.4 mg into the nose once.      ondansetron (ZOFRAN-ODT) 4 MG disintegrating tablet Take 1 tablet (4 mg total) by mouth every 8 (eight) hours as needed for nausea or vomiting. 10 tablet 0   OZEMPIC, 1 MG/DOSE, 2 MG/1.5ML SOPN INJECT 1 MG INTO THE SKIN ONCE A WEEK. 12 pen 1   polyethylene glycol (MIRALAX / GLYCOLAX) 17 g packet Take 17 g by mouth daily as needed for mild constipation or moderate constipation.     pramipexole (MIRAPEX) 0.5 MG tablet TAKE 1 TABLET BY MOUTH AT BEDTIME. 90 tablet 3   simvastatin (ZOCOR) 20 MG tablet TAKE 1 TABLET BY MOUTH EVERY DAY 90 tablet 1   tamsulosin (FLOMAX) 0.4 MG CAPS capsule Take 1 capsule (0.4 mg total) by mouth daily. 30 capsule 0   traMADol (ULTRAM) 50 MG tablet Take 50-100 mg by mouth every 8 (eight) hours as needed (Pain).      No current facility-administered  medications for this visit.   Allergies  Allergen Reactions   Penicillins Anaphylaxis    Has patient had a PCN reaction causing immediate rash, facial/tongue/throat swelling, SOB or lightheadedness with hypotension: Yes Has patient had a PCN reaction causing severe rash involving mucus membranes or skin necrosis: No Has patient had a PCN reaction that required hospitalization No Has patient had a PCN reaction occurring within the last 10 years: No If all of the above answers are "NO", then may proceed with Cephalosporin use.  11/28/20 - patient tolerated Keflex w/o adverse effects    Tape Rash    Plastic tape, silicone tape, Steri Strips   Bactrim [Sulfamethoxazole-Trimethoprim] Nausea Only   Lactose Nausea And Vomiting and Swelling        Paxlovid [Nirmatrelvir-Ritonavir] Nausea And Vomiting   Chlorhexidine Rash and Other (See Comments)    Dermatitis - Allergic     If phone visit, billing and coding can please add appropriate modifier if needed

## 2020-11-28 NOTE — Telephone Encounter (Signed)
Recommend f/u visit to address.

## 2020-11-28 NOTE — Progress Notes (Signed)
Symptoms started Saturday Scratchy throat Fever Body aches Headaches + at home test Saturday  Saw [facility] on 11/26/2020, was given antiviral medication and started having nausea/vomiting Hasn't taken any more of the antiviral today Taking tylenol for symptoms  Now she thinks possible kidney/urinary infection Pain in low back/low abdomen, worse on right side (this has been going on for a couple months, had appt set up with urology but was diagnosed with Covid and couldn't make appt)  Frequent urination, every 2 hours/urinary odor (started last two days)

## 2020-11-29 ENCOUNTER — Other Ambulatory Visit: Payer: Self-pay | Admitting: Family Medicine

## 2020-11-29 ENCOUNTER — Encounter: Payer: Self-pay | Admitting: Physician Assistant

## 2020-11-29 DIAGNOSIS — R109 Unspecified abdominal pain: Secondary | ICD-10-CM | POA: Insufficient documentation

## 2020-11-29 DIAGNOSIS — Z87442 Personal history of urinary calculi: Secondary | ICD-10-CM | POA: Insufficient documentation

## 2020-11-29 NOTE — Patient Instructions (Signed)
Pyelonephritis, Adult Pyelonephritis is an infection that occurs in the kidney. The kidneys are the organs that filter a person's blood and move waste out of the bloodstream and into the urine. Urine passes from the kidneys, through tubes called ureters, and into the bladder. There are two main types of pyelonephritis: Infections that come on quickly without any warning (acute pyelonephritis). Infections that last for a long period of time (chronic pyelonephritis). In most cases, the infection clears up with treatment and does not cause further problems. More severe infections or chronic infections can sometimesspread to the bloodstream or lead to other problems with the kidneys. What are the causes? This condition is usually caused by: Bacteria traveling from the bladder up to the kidney. This may occur after having a bladder infection (cystitis) or urinary tract infection (UTI). Bladder infections caused from bacteria traveling from the bloodstream to the kidney. What increases the risk? This condition is more likely to develop in: Pregnant women. Older people. People who have any of these conditions: Diabetes. Inflammation of the prostate gland (prostatitis), in males. Kidney stones or bladder stones. Other abnormalities of the kidney or ureter. Cancer. People who have a catheter placed in the bladder. People who are sexually active. Women who use spermicides. People who have had a prior UTI. What are the signs or symptoms? Symptoms of this condition include: Frequent urination. Strong or persistent urge to urinate. Burning or stinging when urinating. Abdominal pain. Back pain. Pain in the side or flank area. Fever or chills. Blood in the urine, or dark urine. Nausea or vomiting. How is this diagnosed? This condition may be diagnosed based on: Your medical history and a physical exam. Urine tests. Blood tests. You may also have imaging tests of the kidneys, such as an  ultrasound or CTscan. How is this treated? Treatment for this condition may depend on the severity of the infection. If the infection is mild and is found early, you may be treated with antibiotic medicines taken by mouth (orally). You will need to drink fluids to remain hydrated. If the infection is more severe, you may need to stay in the hospital and receive antibiotics given directly into a vein through an IV. You may also need to receive fluids through an IV if you are not able to remain hydrated. After your hospital stay, you may need to take oral antibiotics for a period of time. Other treatments may be required, depending on the cause of the infection. Follow these instructions at home: Medicines Take your antibiotic medicine as told by your health care provider. Do not stop taking the antibiotic even if you start to feel better. Take over-the-counter and prescription medicines only as told by your health care provider. General instructions  Drink enough fluid to keep your urine pale yellow. Avoid caffeine, tea, and carbonated beverages. They tend to irritate the bladder. Urinate often. Avoid holding in urine for long periods of time. Urinate before and after sex. After a bowel movement, women should cleanse from front to back. Use each tissue only once. Keep all follow-up visits as told by your health care provider. This is important.  Contact a health care provider if: Your symptoms do not get better after 2 days of treatment. Your symptoms get worse. You have a fever. Get help right away if you: Are unable to take your antibiotics or fluids. Have shaking chills. Vomit. Have severe flank or back pain. Have extreme weakness or fainting. Summary Pyelonephritis is a urinary tract infection (UTI)  that occurs in the kidney. Treatment for this condition may depend on the severity of the infection. Take your antibiotic medicine as told by your health care provider. Do not stop  taking the antibiotic even if you start to feel better. Drink enough fluid to keep your urine pale yellow. Keep all follow-up visits as told by your health care provider. This is important. This information is not intended to replace advice given to you by your health care provider. Make sure you discuss any questions you have with your healthcare provider. Document Revised: 03/08/2018 Document Reviewed: 03/08/2018 Elsevier Patient Education  2022 [employer].

## 2020-11-30 ENCOUNTER — Other Ambulatory Visit: Payer: Self-pay | Admitting: Family Medicine

## 2020-12-05 DIAGNOSIS — Z01411 Encounter for gynecological examination (general) (routine) with abnormal findings: Secondary | ICD-10-CM | POA: Diagnosis not present

## 2020-12-05 DIAGNOSIS — Z1159 Encounter for screening for other viral diseases: Secondary | ICD-10-CM | POA: Diagnosis not present

## 2020-12-05 DIAGNOSIS — Z118 Encounter for screening for other infectious and parasitic diseases: Secondary | ICD-10-CM | POA: Diagnosis not present

## 2020-12-05 DIAGNOSIS — R69 Illness, unspecified: Secondary | ICD-10-CM | POA: Diagnosis not present

## 2020-12-05 DIAGNOSIS — N941 Unspecified dyspareunia: Secondary | ICD-10-CM | POA: Diagnosis not present

## 2020-12-18 DIAGNOSIS — N202 Calculus of kidney with calculus of ureter: Secondary | ICD-10-CM | POA: Diagnosis not present

## 2020-12-23 ENCOUNTER — Other Ambulatory Visit: Payer: Self-pay | Admitting: Urology

## 2020-12-23 DIAGNOSIS — N200 Calculus of kidney: Secondary | ICD-10-CM

## 2020-12-24 DIAGNOSIS — M7061 Trochanteric bursitis, right hip: Secondary | ICD-10-CM | POA: Diagnosis not present

## 2020-12-24 DIAGNOSIS — M5440 Lumbago with sciatica, unspecified side: Secondary | ICD-10-CM | POA: Diagnosis not present

## 2020-12-24 DIAGNOSIS — Z79899 Other long term (current) drug therapy: Secondary | ICD-10-CM | POA: Diagnosis not present

## 2020-12-24 DIAGNOSIS — M5417 Radiculopathy, lumbosacral region: Secondary | ICD-10-CM | POA: Diagnosis not present

## 2020-12-24 DIAGNOSIS — M7712 Lateral epicondylitis, left elbow: Secondary | ICD-10-CM | POA: Diagnosis not present

## 2020-12-24 DIAGNOSIS — R69 Illness, unspecified: Secondary | ICD-10-CM | POA: Diagnosis not present

## 2020-12-24 DIAGNOSIS — M961 Postlaminectomy syndrome, not elsewhere classified: Secondary | ICD-10-CM | POA: Diagnosis not present

## 2020-12-24 DIAGNOSIS — M79609 Pain in unspecified limb: Secondary | ICD-10-CM | POA: Diagnosis not present

## 2020-12-24 DIAGNOSIS — M542 Cervicalgia: Secondary | ICD-10-CM | POA: Diagnosis not present

## 2020-12-24 DIAGNOSIS — M79605 Pain in left leg: Secondary | ICD-10-CM | POA: Diagnosis not present

## 2021-01-24 ENCOUNTER — Other Ambulatory Visit: Payer: Self-pay | Admitting: Family Medicine

## 2021-01-30 NOTE — Progress Notes (Signed)
01/30/2021 1345 Upon pre-op phone call patient states she will not be able to be present on Monday 02/03/21 for scheduled lithotripsy. Advised to call Dr. Zettie Pho office and make them aware of need to cancel. Patient verbalized understanding.  Jarid Sasso, Arville Lime

## 2021-02-03 ENCOUNTER — Encounter (HOSPITAL_BASED_OUTPATIENT_CLINIC_OR_DEPARTMENT_OTHER): Admission: RE | Payer: Self-pay | Source: Home / Self Care

## 2021-02-03 ENCOUNTER — Ambulatory Visit (HOSPITAL_BASED_OUTPATIENT_CLINIC_OR_DEPARTMENT_OTHER): Admission: RE | Admit: 2021-02-03 | Payer: Medicare HMO | Source: Home / Self Care | Admitting: Urology

## 2021-02-03 SURGERY — LITHOTRIPSY, ESWL
Anesthesia: LOCAL | Laterality: Left

## 2021-02-06 DIAGNOSIS — N952 Postmenopausal atrophic vaginitis: Secondary | ICD-10-CM | POA: Diagnosis not present

## 2021-02-06 DIAGNOSIS — N941 Unspecified dyspareunia: Secondary | ICD-10-CM | POA: Diagnosis not present

## 2021-02-19 ENCOUNTER — Encounter: Payer: Self-pay | Admitting: Family Medicine

## 2021-02-19 ENCOUNTER — Other Ambulatory Visit: Payer: Self-pay

## 2021-02-19 ENCOUNTER — Ambulatory Visit (INDEPENDENT_AMBULATORY_CARE_PROVIDER_SITE_OTHER): Payer: Medicare HMO | Admitting: Family Medicine

## 2021-02-19 VITALS — BP 133/93 | HR 104 | Ht 63.0 in | Wt 174.0 lb

## 2021-02-19 DIAGNOSIS — L309 Dermatitis, unspecified: Secondary | ICD-10-CM | POA: Diagnosis not present

## 2021-02-19 DIAGNOSIS — Z23 Encounter for immunization: Secondary | ICD-10-CM | POA: Diagnosis not present

## 2021-02-19 DIAGNOSIS — R635 Abnormal weight gain: Secondary | ICD-10-CM

## 2021-02-19 MED ORDER — CLOBETASOL PROPIONATE 0.05 % EX CREA: 1.0000 "application " | TOPICAL_CREAM | Freq: Two times a day (BID) | CUTANEOUS | 0 refills | Status: DC

## 2021-02-19 MED ORDER — TACROLIMUS 0.1 % EX OINT: TOPICAL_OINTMENT | Freq: Two times a day (BID) | CUTANEOUS | 0 refills | Status: DC

## 2021-02-19 MED ORDER — SEMAGLUTIDE (2 MG/DOSE) 8 MG/3ML ~~LOC~~ SOPN: 2.0000 mg | PEN_INJECTOR | SUBCUTANEOUS | 3 refills | Status: DC

## 2021-02-19 NOTE — Assessment & Plan Note (Signed)
Rash on foot, hands and perioral area with eczematous appearance.  She will start clobetasol for rash on foot and hands.  Start Protopic for perioral area.  Encouraged to continue to use moisturizer daily.

## 2021-02-19 NOTE — Assessment & Plan Note (Signed)
We will continue Ozempic however will increase to 2 mg weekly.  She will continue to work on dietary change.

## 2021-02-19 NOTE — Progress Notes (Signed)
Katrina Fernandez - 55 y.o. female MRN [id number]  Date of birth: March 21, 1966  Subjective No chief complaint on file.   HPI Katrina Fernandez is a 55 year old female here today with complaint of rash.  She has noted rash on her right foot as well as a couple of spots on her hands.  She has been using CeraVe regularly and has had improvement with that however since she stopped using this the rash returns.  She does not have any itching or pain associated with this.  There is some flaking and dry appearance to skin.  She also has some cracking and flaking around her lips.  This worsens with Chapstick but she is been able to control with using Vaseline  She is also wanting to discuss weight loss.  She continues on Ozempic for management of weight.  She feels like she has had a bit of a plateau but still feels that this is effective for helping curb her appetite some.  She is tolerating this well.  ROS:  A comprehensive ROS was completed and negative except as noted per HPI   Allergies  Allergen Reactions   Penicillins Anaphylaxis    Has patient had a PCN reaction causing immediate rash, facial/tongue/throat swelling, SOB or lightheadedness with hypotension: Yes Has patient had a PCN reaction causing severe rash involving mucus membranes or skin necrosis: No Has patient had a PCN reaction that required hospitalization No Has patient had a PCN reaction occurring within the last 10 years: No If all of the above answers are "NO", then may proceed with Cephalosporin use.  11/28/20 - patient tolerated Keflex w/o adverse effects    Tape Rash    Plastic tape, silicone tape, Steri Strips   Bactrim [Sulfamethoxazole-Trimethoprim] Nausea Only   Lactose Nausea And Vomiting and Swelling        Paxlovid [Nirmatrelvir-Ritonavir] Nausea And Vomiting   Chlorhexidine Rash and Other (See Comments)    Dermatitis - Allergic    Past Medical History:  Diagnosis Date   Anemia    Anxiety    Arthritis    "back and  knees" (07/23/2016)   Chiari malformation type I ([place]) dx'd 2014   Chronic back pain    buldging, DDD; "neck and lower back" (07/23/2016)   Chronic constipation    takes Miralax daily   Chronic pain syndrome    takes Methadone and Keppra daily   Depression    takes Effexor daily   Endometriosis    1995   Fibromyalgia    GERD (gastroesophageal reflux disease)    not taking anything at the present time   History of DVT of lower extremity 07/2012   LEFT LEG --  3/ 2014.Was on a blood transfusion for 6 months   History of kidney stones    History of MRSA infection 10 yrs ago   Hyperlipidemia    takes Simvastatin daily   Migraine    "daily recently" (07/23/2016)   Muscle spasm    takes Zanaflex nightly   Peripheral edema    takes Lasix daily as needed   Peripheral neuropathy    Pleurisy 2000   Pneumonia    hx of-7-8 yrs ago   PONV (postoperative nausea and vomiting)    Seasonal allergies    takes Claritin daily   Spinal headache    "since 2002; still get them; try to manage it w/lying flat"    Past Surgical History:  Procedure Laterality Date   ANTERIOR CERVICAL DECOMP/DISCECTOMY FUSION  07-25-2009  C5 -- C6   APPENDECTOMY  age 39   APPLICATION OF CRANIAL NAVIGATION N/A 06/23/2017   Procedure: APPLICATION OF CRANIAL NAVIGATION;  Surgeon: Kary Kos, MD;  Location: [facility];  Service: Neurosurgery;  Laterality: N/A;   BACK SURGERY     BRAIN SURGERY     VP shunt   BREAST REDUCTION SURGERY Bilateral 06/07/2013   Procedure: BILATERAL MAMMARY REDUCTION  (BREAST);  Surgeon: Theodoro Kos, DO;  Location: [facility];  Service: Plastics;  Laterality: Bilateral;   BREAST REDUCTION SURGERY Bilateral 06/07/2013   Procedure:  LIPOSUCTION;  Surgeon: Theodoro Kos, DO;  Location: [facility];  Service: Plastics;  Laterality: Bilateral;   CARPAL TUNNEL RELEASE Bilateral 2001   COLONOSCOPY  X 2   "polyps were benign"   DILATION AND CURETTAGE OF UTERUS      ESOPHAGOGASTRODUODENOSCOPY     gastric banding undone  11/2014   LAPAROSCOPIC CHOLECYSTECTOMY  ~ 2000   LAPAROSCOPIC GASTRIC BANDING  2011   LAPAROSCOPIC REVISION VENTRICULAR-PERITONEAL (V-P) SHUNT N/A 06/23/2017   Procedure: Shunt Placment stereotactic ventricular catheter placement and general surgery abdominal exposure  (Laparoscopic vs. Open) Brain Lab;  Surgeon: Clovis Riley, MD;  Location: [facility];  Service: General;  Laterality: N/A;   LAPAROSCOPY N/A 06/23/2017   Procedure: LAPAROSCOPY DIAGNOSTIC;  Surgeon: Clovis Riley, MD;  Location: [facility];  Service: General;  Laterality: N/A;   LUMBAR LAMINECTOMY/DECOMPRESSION MICRODISCECTOMY N/A 07/23/2016   Procedure: Decompressive Lumbar Laminectomy Lumbar two-three  for Repair of  Cerberal Spinal Fluid  Leak;  Surgeon: Kary Kos, MD;  Location: [facility];  Service: Neurosurgery;  Laterality: N/A;   LUMBAR LAMINECTOMY/DECOMPRESSION MICRODISCECTOMY Left 03/01/2019   Procedure: LEFT LUMBAR THREE-FOUR EXTRAFORAMINAL MICRODISCECTOMY;  Surgeon: Kary Kos, MD;  Location: [facility];  Service: Neurosurgery;  Laterality: Left;   PLACEMENT LUMBOPERITONEAL SHUNT FOR CEREBROSPINAL FLUID DIVERSION AWAY FROM C8 PERINEURAL CYST  09-05-2003  &  12-24-2003   08-31-2003  REMOVAL SHUNT   POSTERIOR CERVICAL LAMINECTOMY  03-01-2001   C7-8 and Exploration C8 perineural cyst   POSTERIOR LUMBAR LAMINECTOMY L4-5/  DISKECTOMY L5-S1  05-20-2005   RE-DO DECOMPRESSION LAMINECTOMY AND FUSION L4-5  &  L5-S1  12-20-2006   02-04-2007--  RE-EXPLORATION L4 to S1, I & D LUMBAR WOUND HEMATOMA   REDUCTION MAMMAPLASTY     SHUNT REMOVAL N/A 07/10/2016   Procedure: SHUNT REMOVAL;  Surgeon: Kary Kos, MD;  Location: [facility];  Service: Neurosurgery;  Laterality: N/A;  SHUNT REMOVAL   SPINAL CORD STIMULATOR INSERTION N/A 04/26/2015   Procedure: LUMBAR SPINAL CORD STIMULATOR INSERTION;  Surgeon: Clydell Hakim, MD;  Location: [facility];  Service: Neurosurgery;  Laterality: N/A;  LUMBAR SPINAL CORD  STIMULATOR INSERTION   SPINAL CORD STIMULATOR REMOVAL N/A 06/28/2019   Procedure: Removal of dorsal column stimulator and generator -;  Surgeon: Kary Kos, MD;  Location: [facility];  Service: Neurosurgery;  Laterality: N/A;   TENNIS ELBOW RELEASE/NIRSCHEL PROCEDURE Left 12/13/2014   Procedure: LEFT ELBOW LATERAL EPICONDYLAR DEBRIDEMENT AND OR REPAIR -RECONSTRUCTION ;  Surgeon: Iran Planas, MD;  Location: [place] [facility];  Service: Orthopedics;  Laterality: Left;   TOTAL ABDOMINAL HYSTERECTOMY  1994   w/  Bilateral Salpinoophorectomy   VENTRICULOPERITONEAL SHUNT N/A 06/23/2017   Procedure: VENTRICULAR-PERITONEAL SHUNT INSERTION WITH ABDOMINAL LAPARASCOPY AND BRAINLAB NAVIGATION;  Surgeon: Kary Kos, MD;  Location: [facility];  Service: Neurosurgery;  Laterality: N/A;    Social History   Socioeconomic History   Marital status: Divorced  Spouse name: Not on file   Number of children: 2   Years of education: Assoc   Highest education level: Associate degree: academic program  Occupational History   Occupation: Disabled   Tobacco Use   Smoking status: Never   Smokeless tobacco: Never  Vaping Use   Vaping Use: Never used  Substance and Sexual Activity   Alcohol use: Yes    Comment: once a year   Drug use: No   Sexual activity: Not Currently    Birth control/protection: Surgical  Other Topics Concern   Not on file  Social History Narrative   Caffeine: occasionally    Mrs Chrisley is divorced, on permanent disability and lives at home with her mother and her daughter. She is independent in her care needs. She denies concern with transportation to medical appointments She is on a fixed income   Social Determinants of Radio broadcast assistant Strain: Low Risk    Difficulty of Paying Living Expenses: Not very hard  Food Insecurity: Food Insecurity Present   Worried About Charity fundraiser in the Last Year: Sometimes true   Arboriculturist in the Last Year: Never true   Transportation Needs: No Transportation Needs   Lack of Transportation (Medical): No   Lack of Transportation (Non-Medical): No  Physical Activity: Inactive   Days of Exercise per Week: 0 days   Minutes of Exercise per Session: 0 min  Stress: Stress Concern Present   Feeling of Stress : To some extent  Social Connections: Socially Isolated   Frequency of Communication with Friends and Family: More than three times a week   Frequency of Social Gatherings with Friends and Family: Once a week   Attends Religious Services: Never   Marine scientist or Organizations: No   Attends Music therapist: Never   Marital Status: Divorced    Family History  Problem Relation Age of Onset   Depression Other    Colon cancer Father    Ovarian cancer Maternal Grandmother    Pancreatic cancer Maternal Grandfather     Health Maintenance  Topic Date Due   Zoster Vaccines- Shingrix (1 of 2) Never done   COVID-19 Vaccine (3 - Mixed Product risk series) 12/05/2019   TETANUS/TDAP  07/24/2021 (Originally 05/19/2019)   Hepatitis C Screening  07/24/2021 (Originally 08/23/1985)   MAMMOGRAM  07/27/2022   COLONOSCOPY (Pts 45-34yrs Insurance coverage will need to be confirmed)  07/05/2023   INFLUENZA VACCINE  Completed   HIV Screening  Completed   HPV VACCINES  Aged Out     ----------------------------------------------------------------------------------------------------------------------------------------------------------------------------------------------------------------- Physical Exam BP (!) 133/93 (BP Location: Left Arm, Patient Position: Sitting, Cuff Size: Normal)   Pulse (!) 104   Ht 5\' 3"  (1.6 m)   Wt 174 lb (78.9 kg)   SpO2 100%   BMI 30.82 kg/m   Physical Exam Constitutional:      Appearance: Normal appearance.  HENT:     Head: Normocephalic and atraumatic.  Eyes:     General: No scleral icterus. Musculoskeletal:     Cervical back: Neck supple.  Neurological:      General: No focal deficit present.     Mental Status: She is alert.  Psychiatric:        Mood and Affect: Mood normal.        Behavior: Behavior normal.    ------------------------------------------------------------------------------------------------------------------------------------------------------------------------------------------------------------------- Assessment and Plan  Eczema Rash on foot, hands and perioral area with eczematous appearance.  She will start  clobetasol for rash on foot and hands.  Start Protopic for perioral area.  Encouraged to continue to use moisturizer daily.  Abnormal weight gain We will continue Ozempic however will increase to 2 mg weekly.  She will continue to work on dietary change.   Meds ordered this encounter  Medications   clobetasol cream (TEMOVATE) 0.05 %    Sig: Apply 1 application topically 2 (two) times daily.    Dispense:  30 g    Refill:  0   tacrolimus (PROTOPIC) 0.1 % ointment    Sig: Apply topically 2 (two) times daily. Use on face around mouth/lips    Dispense:  100 g    Refill:  0   Semaglutide, 2 MG/DOSE, 8 MG/3ML SOPN    Sig: Inject 2 mg as directed once a week.    Dispense:  3 mL    Refill:  3    No follow-ups on file.    This visit occurred during the SARS-CoV-2 public health emergency.  Safety protocols were in place, including screening questions prior to the visit, additional usage of staff PPE, and extensive cleaning of exam room while observing appropriate contact time as indicated for disinfecting solutions.

## 2021-03-07 ENCOUNTER — Other Ambulatory Visit: Payer: Self-pay

## 2021-03-07 ENCOUNTER — Ambulatory Visit (INDEPENDENT_AMBULATORY_CARE_PROVIDER_SITE_OTHER): Payer: Medicare HMO | Admitting: Sports Medicine

## 2021-03-07 DIAGNOSIS — J40 Bronchitis, not specified as acute or chronic: Secondary | ICD-10-CM | POA: Diagnosis not present

## 2021-03-07 DIAGNOSIS — J329 Chronic sinusitis, unspecified: Secondary | ICD-10-CM | POA: Diagnosis not present

## 2021-03-07 MED ORDER — AZITHROMYCIN 250 MG PO TABS: ORAL_TABLET | ORAL | 0 refills | Status: DC

## 2021-03-07 MED ORDER — BENZONATATE 200 MG PO CAPS: 200.0000 mg | ORAL_CAPSULE | Freq: Three times a day (TID) | ORAL | 0 refills | Status: DC | PRN

## 2021-03-07 NOTE — Assessment & Plan Note (Signed)
Pleasant 55 year old female, 1 week of cough, sore throat, facial pain and pressure, muscle aches and body aches, this initially presented with a fever, COVID test negative x2, has not had a flu test. Exam is for the most part benign. Considering duration of symptoms, as well as sinusitis symptoms we will add azithromycin, Tessalon Perles, she will get over-the-counter TheraFlu, return to see [place] if not better in a week or 2.

## 2021-03-07 NOTE — Progress Notes (Signed)
    Procedures performed today:    None.  Independent interpretation of notes and tests performed by another provider:   None.  Brief History, Exam, Impression, and Recommendations:    Sinobronchitis Pleasant 55 year old female, 1 week of cough, sore throat, facial pain and pressure, muscle aches and body aches, this initially presented with a fever, COVID test negative x2, has not had a flu test. Exam is for the most part benign. Considering duration of symptoms, as well as sinusitis symptoms we will add azithromycin, Tessalon Perles, she will get over-the-counter TheraFlu, return to see [place] if not better in a week or 2.    ___________________________________________ Gwen Her. Dianah Field, M.D., ABFM., CAQSM. Primary Care and [place] Instructor of [place] [facility] of Medicine

## 2021-03-11 ENCOUNTER — Other Ambulatory Visit: Payer: Self-pay | Admitting: Family Medicine

## 2021-03-17 ENCOUNTER — Other Ambulatory Visit: Payer: Self-pay | Admitting: Family Medicine

## 2021-03-18 DIAGNOSIS — M542 Cervicalgia: Secondary | ICD-10-CM | POA: Diagnosis not present

## 2021-03-18 DIAGNOSIS — G8929 Other chronic pain: Secondary | ICD-10-CM | POA: Diagnosis not present

## 2021-03-18 DIAGNOSIS — M79609 Pain in unspecified limb: Secondary | ICD-10-CM | POA: Diagnosis not present

## 2021-03-18 DIAGNOSIS — M79605 Pain in left leg: Secondary | ICD-10-CM | POA: Diagnosis not present

## 2021-03-18 DIAGNOSIS — M7061 Trochanteric bursitis, right hip: Secondary | ICD-10-CM | POA: Diagnosis not present

## 2021-03-18 DIAGNOSIS — M961 Postlaminectomy syndrome, not elsewhere classified: Secondary | ICD-10-CM | POA: Diagnosis not present

## 2021-03-18 DIAGNOSIS — M5440 Lumbago with sciatica, unspecified side: Secondary | ICD-10-CM | POA: Diagnosis not present

## 2021-03-18 DIAGNOSIS — R519 Headache, unspecified: Secondary | ICD-10-CM | POA: Diagnosis not present

## 2021-03-18 DIAGNOSIS — R030 Elevated blood-pressure reading, without diagnosis of hypertension: Secondary | ICD-10-CM | POA: Diagnosis not present

## 2021-03-18 DIAGNOSIS — Z6830 Body mass index (BMI) 30.0-30.9, adult: Secondary | ICD-10-CM | POA: Diagnosis not present

## 2021-03-18 DIAGNOSIS — R69 Illness, unspecified: Secondary | ICD-10-CM | POA: Diagnosis not present

## 2021-03-18 DIAGNOSIS — Z5181 Encounter for therapeutic drug level monitoring: Secondary | ICD-10-CM | POA: Diagnosis not present

## 2021-03-18 DIAGNOSIS — M5417 Radiculopathy, lumbosacral region: Secondary | ICD-10-CM | POA: Diagnosis not present

## 2021-03-18 DIAGNOSIS — Z79899 Other long term (current) drug therapy: Secondary | ICD-10-CM | POA: Diagnosis not present

## 2021-03-19 ENCOUNTER — Other Ambulatory Visit: Payer: Self-pay | Admitting: Family Medicine

## 2021-03-20 ENCOUNTER — Other Ambulatory Visit: Payer: Self-pay | Admitting: Family Medicine

## 2021-03-24 ENCOUNTER — Other Ambulatory Visit: Payer: Self-pay | Admitting: Neurosurgery

## 2021-03-24 DIAGNOSIS — R519 Headache, unspecified: Secondary | ICD-10-CM

## 2021-04-03 ENCOUNTER — Other Ambulatory Visit: Payer: Self-pay | Admitting: Family Medicine

## 2021-04-09 ENCOUNTER — Other Ambulatory Visit: Payer: Self-pay

## 2021-04-09 ENCOUNTER — Ambulatory Visit
Admission: RE | Admit: 2021-04-09 | Discharge: 2021-04-09 | Disposition: A | Discharge status: Home / Self Care | Payer: Medicare HMO | Source: Ambulatory Visit | Attending: Neurosurgery | Admitting: Neurosurgery

## 2021-04-09 DIAGNOSIS — R519 Headache, unspecified: Secondary | ICD-10-CM

## 2021-04-09 DIAGNOSIS — Z982 Presence of cerebrospinal fluid drainage device: Secondary | ICD-10-CM | POA: Diagnosis not present

## 2021-04-22 DIAGNOSIS — J209 Acute bronchitis, unspecified: Secondary | ICD-10-CM | POA: Diagnosis not present

## 2021-04-22 DIAGNOSIS — R059 Cough, unspecified: Secondary | ICD-10-CM | POA: Diagnosis not present

## 2021-05-01 DIAGNOSIS — M5481 Occipital neuralgia: Secondary | ICD-10-CM | POA: Insufficient documentation

## 2021-05-01 DIAGNOSIS — R030 Elevated blood-pressure reading, without diagnosis of hypertension: Secondary | ICD-10-CM | POA: Diagnosis not present

## 2021-05-01 DIAGNOSIS — Z6830 Body mass index (BMI) 30.0-30.9, adult: Secondary | ICD-10-CM | POA: Diagnosis not present

## 2021-05-17 ENCOUNTER — Other Ambulatory Visit: Payer: Self-pay | Admitting: Family Medicine

## 2021-05-18 HISTORY — PX: CYSTOSCOPY WITH RETROGRADE PYELOGRAM, URETEROSCOPY AND STENT PLACEMENT: SHX5789

## 2021-06-03 DIAGNOSIS — Z90710 Acquired absence of both cervix and uterus: Secondary | ICD-10-CM | POA: Diagnosis not present

## 2021-06-03 DIAGNOSIS — R1013 Epigastric pain: Secondary | ICD-10-CM | POA: Diagnosis not present

## 2021-06-03 DIAGNOSIS — N132 Hydronephrosis with renal and ureteral calculous obstruction: Secondary | ICD-10-CM | POA: Diagnosis not present

## 2021-06-03 DIAGNOSIS — R000 Tachycardia, unspecified: Secondary | ICD-10-CM | POA: Diagnosis not present

## 2021-06-04 DIAGNOSIS — N218 Other lower urinary tract calculus: Secondary | ICD-10-CM | POA: Diagnosis not present

## 2021-06-04 DIAGNOSIS — N12 Tubulo-interstitial nephritis, not specified as acute or chronic: Secondary | ICD-10-CM | POA: Diagnosis not present

## 2021-06-04 DIAGNOSIS — J9600 Acute respiratory failure, unspecified whether with hypoxia or hypercapnia: Secondary | ICD-10-CM | POA: Diagnosis not present

## 2021-06-04 DIAGNOSIS — G894 Chronic pain syndrome: Secondary | ICD-10-CM | POA: Diagnosis not present

## 2021-06-04 DIAGNOSIS — I82409 Acute embolism and thrombosis of unspecified deep veins of unspecified lower extremity: Secondary | ICD-10-CM | POA: Diagnosis not present

## 2021-06-04 DIAGNOSIS — Z91018 Allergy to other foods: Secondary | ICD-10-CM | POA: Diagnosis not present

## 2021-06-04 DIAGNOSIS — R0603 Acute respiratory distress: Secondary | ICD-10-CM | POA: Diagnosis not present

## 2021-06-04 DIAGNOSIS — Z6835 Body mass index (BMI) 35.0-35.9, adult: Secondary | ICD-10-CM | POA: Diagnosis not present

## 2021-06-04 DIAGNOSIS — Z960 Presence of urogenital implants: Secondary | ICD-10-CM | POA: Diagnosis not present

## 2021-06-04 DIAGNOSIS — R110 Nausea: Secondary | ICD-10-CM | POA: Diagnosis not present

## 2021-06-04 DIAGNOSIS — I2699 Other pulmonary embolism without acute cor pulmonale: Secondary | ICD-10-CM | POA: Diagnosis not present

## 2021-06-04 DIAGNOSIS — Z882 Allergy status to sulfonamides status: Secondary | ICD-10-CM | POA: Diagnosis not present

## 2021-06-04 DIAGNOSIS — E663 Overweight: Secondary | ICD-10-CM | POA: Diagnosis not present

## 2021-06-04 DIAGNOSIS — Z9049 Acquired absence of other specified parts of digestive tract: Secondary | ICD-10-CM | POA: Diagnosis not present

## 2021-06-04 DIAGNOSIS — R918 Other nonspecific abnormal finding of lung field: Secondary | ICD-10-CM | POA: Diagnosis not present

## 2021-06-04 DIAGNOSIS — N209 Urinary calculus, unspecified: Secondary | ICD-10-CM | POA: Diagnosis not present

## 2021-06-04 DIAGNOSIS — K6389 Other specified diseases of intestine: Secondary | ICD-10-CM | POA: Diagnosis not present

## 2021-06-04 DIAGNOSIS — N136 Pyonephrosis: Secondary | ICD-10-CM | POA: Diagnosis not present

## 2021-06-04 DIAGNOSIS — N201 Calculus of ureter: Secondary | ICD-10-CM | POA: Diagnosis not present

## 2021-06-04 DIAGNOSIS — R109 Unspecified abdominal pain: Secondary | ICD-10-CM | POA: Diagnosis not present

## 2021-06-04 DIAGNOSIS — Z880 Allergy status to penicillin: Secondary | ICD-10-CM | POA: Diagnosis not present

## 2021-06-04 DIAGNOSIS — E872 Acidosis, unspecified: Secondary | ICD-10-CM | POA: Diagnosis not present

## 2021-06-04 DIAGNOSIS — K5900 Constipation, unspecified: Secondary | ICD-10-CM | POA: Diagnosis not present

## 2021-06-04 DIAGNOSIS — A419 Sepsis, unspecified organism: Secondary | ICD-10-CM | POA: Diagnosis not present

## 2021-06-04 DIAGNOSIS — K219 Gastro-esophageal reflux disease without esophagitis: Secondary | ICD-10-CM | POA: Diagnosis not present

## 2021-06-04 DIAGNOSIS — E860 Dehydration: Secondary | ICD-10-CM | POA: Diagnosis not present

## 2021-06-04 DIAGNOSIS — N3000 Acute cystitis without hematuria: Secondary | ICD-10-CM | POA: Diagnosis not present

## 2021-06-04 DIAGNOSIS — B951 Streptococcus, group B, as the cause of diseases classified elsewhere: Secondary | ICD-10-CM | POA: Diagnosis not present

## 2021-06-04 DIAGNOSIS — R1013 Epigastric pain: Secondary | ICD-10-CM | POA: Diagnosis not present

## 2021-06-04 DIAGNOSIS — A4151 Sepsis due to Escherichia coli [E. coli]: Secondary | ICD-10-CM | POA: Diagnosis not present

## 2021-06-04 DIAGNOSIS — N200 Calculus of kidney: Secondary | ICD-10-CM | POA: Diagnosis not present

## 2021-06-04 DIAGNOSIS — R6521 Severe sepsis with septic shock: Secondary | ICD-10-CM | POA: Diagnosis not present

## 2021-06-04 DIAGNOSIS — Z452 Encounter for adjustment and management of vascular access device: Secondary | ICD-10-CM | POA: Diagnosis not present

## 2021-06-04 DIAGNOSIS — Z86718 Personal history of other venous thrombosis and embolism: Secondary | ICD-10-CM | POA: Diagnosis not present

## 2021-06-04 DIAGNOSIS — J9601 Acute respiratory failure with hypoxia: Secondary | ICD-10-CM | POA: Diagnosis not present

## 2021-06-04 DIAGNOSIS — G8929 Other chronic pain: Secondary | ICD-10-CM | POA: Diagnosis not present

## 2021-06-04 DIAGNOSIS — R000 Tachycardia, unspecified: Secondary | ICD-10-CM | POA: Diagnosis not present

## 2021-06-04 DIAGNOSIS — Z90710 Acquired absence of both cervix and uterus: Secondary | ICD-10-CM | POA: Diagnosis not present

## 2021-06-04 DIAGNOSIS — Z4682 Encounter for fitting and adjustment of non-vascular catheter: Secondary | ICD-10-CM | POA: Diagnosis not present

## 2021-06-04 DIAGNOSIS — R69 Illness, unspecified: Secondary | ICD-10-CM | POA: Diagnosis not present

## 2021-06-04 DIAGNOSIS — Z888 Allergy status to other drugs, medicaments and biological substances status: Secondary | ICD-10-CM | POA: Diagnosis not present

## 2021-06-04 DIAGNOSIS — Z466 Encounter for fitting and adjustment of urinary device: Secondary | ICD-10-CM | POA: Diagnosis not present

## 2021-06-04 DIAGNOSIS — N390 Urinary tract infection, site not specified: Secondary | ICD-10-CM | POA: Diagnosis not present

## 2021-06-04 DIAGNOSIS — F32A Depression, unspecified: Secondary | ICD-10-CM | POA: Diagnosis not present

## 2021-06-04 DIAGNOSIS — F419 Anxiety disorder, unspecified: Secondary | ICD-10-CM | POA: Diagnosis not present

## 2021-06-04 DIAGNOSIS — N179 Acute kidney failure, unspecified: Secondary | ICD-10-CM | POA: Diagnosis not present

## 2021-06-04 DIAGNOSIS — N1330 Unspecified hydronephrosis: Secondary | ICD-10-CM | POA: Diagnosis not present

## 2021-06-04 DIAGNOSIS — G2581 Restless legs syndrome: Secondary | ICD-10-CM | POA: Diagnosis not present

## 2021-06-04 DIAGNOSIS — R7881 Bacteremia: Secondary | ICD-10-CM | POA: Diagnosis not present

## 2021-06-04 DIAGNOSIS — N132 Hydronephrosis with renal and ureteral calculous obstruction: Secondary | ICD-10-CM | POA: Diagnosis not present

## 2021-06-04 DIAGNOSIS — E785 Hyperlipidemia, unspecified: Secondary | ICD-10-CM | POA: Diagnosis not present

## 2021-06-04 DIAGNOSIS — R7303 Prediabetes: Secondary | ICD-10-CM | POA: Diagnosis not present

## 2021-06-04 DIAGNOSIS — N131 Hydronephrosis with ureteral stricture, not elsewhere classified: Secondary | ICD-10-CM | POA: Diagnosis not present

## 2021-06-04 DIAGNOSIS — M545 Low back pain, unspecified: Secondary | ICD-10-CM | POA: Diagnosis not present

## 2021-06-04 DIAGNOSIS — J90 Pleural effusion, not elsewhere classified: Secondary | ICD-10-CM | POA: Diagnosis not present

## 2021-06-05 DIAGNOSIS — A419 Sepsis, unspecified organism: Secondary | ICD-10-CM | POA: Diagnosis not present

## 2021-06-05 DIAGNOSIS — N201 Calculus of ureter: Secondary | ICD-10-CM | POA: Diagnosis not present

## 2021-06-05 DIAGNOSIS — R109 Unspecified abdominal pain: Secondary | ICD-10-CM | POA: Diagnosis not present

## 2021-06-06 DIAGNOSIS — R109 Unspecified abdominal pain: Secondary | ICD-10-CM | POA: Diagnosis not present

## 2021-06-06 DIAGNOSIS — J9601 Acute respiratory failure with hypoxia: Secondary | ICD-10-CM | POA: Diagnosis not present

## 2021-06-06 DIAGNOSIS — A419 Sepsis, unspecified organism: Secondary | ICD-10-CM | POA: Diagnosis not present

## 2021-06-06 DIAGNOSIS — R6521 Severe sepsis with septic shock: Secondary | ICD-10-CM | POA: Diagnosis not present

## 2021-06-06 DIAGNOSIS — N12 Tubulo-interstitial nephritis, not specified as acute or chronic: Secondary | ICD-10-CM | POA: Diagnosis not present

## 2021-06-12 ENCOUNTER — Telehealth: Payer: Self-pay | Admitting: Family Medicine

## 2021-06-12 NOTE — Telephone Encounter (Signed)
Lattie Haw from [facility] called to inform you guys that Katrina Fernandez will be having PT with them on she checks out of the ER. If you have any questions or want to follow up with them the phone number is [phone].

## 2021-06-16 ENCOUNTER — Other Ambulatory Visit: Payer: Self-pay

## 2021-06-16 ENCOUNTER — Encounter: Payer: Self-pay | Admitting: Family Medicine

## 2021-06-16 ENCOUNTER — Ambulatory Visit (INDEPENDENT_AMBULATORY_CARE_PROVIDER_SITE_OTHER): Payer: Medicare HMO | Admitting: Family Medicine

## 2021-06-16 VITALS — BP 144/87 | HR 105 | Ht 63.0 in | Wt 171.0 lb

## 2021-06-16 DIAGNOSIS — N200 Calculus of kidney: Secondary | ICD-10-CM | POA: Diagnosis not present

## 2021-06-16 DIAGNOSIS — N179 Acute kidney failure, unspecified: Secondary | ICD-10-CM | POA: Diagnosis not present

## 2021-06-17 LAB — CBC WITH DIFFERENTIAL/PLATELET
Absolute Monocytes: 757 cells/uL (ref 200–950)
Basophils Absolute: 114 cells/uL (ref 0–200)
Basophils Relative: 1.3 %
Eosinophils Absolute: 70 cells/uL (ref 15–500)
Eosinophils Relative: 0.8 %
HCT: 37.9 % (ref 35.0–45.0)
Hemoglobin: 12.6 g/dL (ref 11.7–15.5)
Lymphs Abs: 1883 cells/uL (ref 850–3900)
MCH: 29.3 pg (ref 27.0–33.0)
MCHC: 33.2 g/dL (ref 32.0–36.0)
MCV: 88.1 fL (ref 80.0–100.0)
MPV: 11 fL (ref 7.5–12.5)
Monocytes Relative: 8.6 %
Neutro Abs: 5975 cells/uL (ref 1500–7800)
Neutrophils Relative %: 67.9 %
Platelets: 369 10*3/uL (ref 140–400)
RBC: 4.3 10*6/uL (ref 3.80–5.10)
RDW: 13.4 % (ref 11.0–15.0)
Total Lymphocyte: 21.4 %
WBC: 8.8 10*3/uL (ref 3.8–10.8)

## 2021-06-17 LAB — COMPLETE METABOLIC PANEL WITH GFR
AG Ratio: 1.9 (calc) (ref 1.0–2.5)
ALT: 14 U/L (ref 6–29)
AST: 15 U/L (ref 10–35)
Albumin: 4.2 g/dL (ref 3.6–5.1)
Alkaline phosphatase (APISO): 99 U/L (ref 37–153)
BUN: 18 mg/dL (ref 7–25)
CO2: 27 mmol/L (ref 20–32)
Calcium: 9.4 mg/dL (ref 8.6–10.4)
Chloride: 104 mmol/L (ref 98–110)
Creat: 0.7 mg/dL (ref 0.50–1.03)
Globulin: 2.2 g/dL (calc) (ref 1.9–3.7)
Glucose, Bld: 72 mg/dL (ref 65–99)
Potassium: 4.3 mmol/L (ref 3.5–5.3)
Sodium: 140 mmol/L (ref 135–146)
Total Bilirubin: 0.4 mg/dL (ref 0.2–1.2)
Total Protein: 6.4 g/dL (ref 6.1–8.1)
eGFR: 103 mL/min/{1.73_m2} (ref >=60)

## 2021-06-20 DIAGNOSIS — N218 Other lower urinary tract calculus: Secondary | ICD-10-CM | POA: Diagnosis not present

## 2021-06-20 DIAGNOSIS — N200 Calculus of kidney: Secondary | ICD-10-CM | POA: Diagnosis not present

## 2021-06-22 DIAGNOSIS — N179 Acute kidney failure, unspecified: Secondary | ICD-10-CM | POA: Insufficient documentation

## 2021-06-22 NOTE — Progress Notes (Signed)
Katrina Fernandez - 56 y.o. female MRN [id number]  Date of birth: 08/03/1965  Subjective Chief Complaint  Patient presents with   Hospitalization Follow-up    HPI Katrina Fernandez is a 56 year old female here today for hospital follow-up.  She was recently hospitalized for pyelonephritis and sepsis related to to urinary obstruction from ureteral stone.  She had ureteral stent placement initially however had postoperative shock requiring pressors and mechanical ventilation.  She was able to be extubated fairly quickly and pressors were slowly weaned off throughout her hospitalization.  She did have acute kidney injury related to her obstructive uropathy.  Creatinine had improved by time of discharge.  She was discharged on Cipro to complete 14-day course of antibiotics.  She does have follow-up with urology in the next couple of days for lithotripsy.    ROS:  A comprehensive ROS was completed and negative except as noted per HPI  Allergies  Allergen Reactions   Penicillins Anaphylaxis    11/28/20 - patient tolerated Keflex w/o adverse effects    Tape Rash    Plastic tape, silicone tape, Steri Strips   Bactrim [Sulfamethoxazole-Trimethoprim] Nausea Only   Lactose Nausea And Vomiting and Swelling        Paxlovid [Nirmatrelvir-Ritonavir] Nausea And Vomiting   Chlorhexidine Rash and Other (See Comments)    Dermatitis - Allergic    Past Medical History:  Diagnosis Date   Anemia    Anxiety    Arthritis    "back and knees" (07/23/2016)   Chiari malformation type I ([place]) dx'd 2014   Chronic back pain    buldging, DDD; "neck and lower back" (07/23/2016)   Chronic constipation    takes Miralax daily   Chronic pain syndrome    takes Methadone and Keppra daily   Depression    takes Effexor daily   Endometriosis    1995   Fibromyalgia    GERD (gastroesophageal reflux disease)    not taking anything at the present time   History of DVT of lower extremity 07/2012   LEFT LEG --  3/ 2014.Was on a  blood transfusion for 6 months   History of kidney stones    History of MRSA infection 10 yrs ago   Hyperlipidemia    takes Simvastatin daily   Migraine    "daily recently" (07/23/2016)   Muscle spasm    takes Zanaflex nightly   Peripheral edema    takes Lasix daily as needed   Peripheral neuropathy    Pleurisy 2000   Pneumonia    hx of-7-8 yrs ago   PONV (postoperative nausea and vomiting)    Seasonal allergies    takes Claritin daily   Spinal headache    "since 2002; still get them; try to manage it w/lying flat"    Past Surgical History:  Procedure Laterality Date   ANTERIOR CERVICAL DECOMP/DISCECTOMY FUSION  07-25-2009   C5 -- C6   APPENDECTOMY  age 60   APPLICATION OF CRANIAL NAVIGATION N/A 06/23/2017   Procedure: APPLICATION OF CRANIAL NAVIGATION;  Surgeon: Kary Kos, MD;  Location: [facility];  Service: Neurosurgery;  Laterality: N/A;   BACK SURGERY     BRAIN SURGERY     VP shunt   BREAST REDUCTION SURGERY Bilateral 06/07/2013   Procedure: BILATERAL MAMMARY REDUCTION  (BREAST);  Surgeon: Theodoro Kos, DO;  Location: [facility];  Service: Plastics;  Laterality: Bilateral;   BREAST REDUCTION SURGERY Bilateral 06/07/2013   Procedure:  LIPOSUCTION;  Surgeon: Theodoro Kos, DO;  Location: [facility];  Service: Plastics;  Laterality: Bilateral;   CARPAL TUNNEL RELEASE Bilateral 2001   COLONOSCOPY  X 2   "polyps were benign"   DILATION AND CURETTAGE OF UTERUS     ESOPHAGOGASTRODUODENOSCOPY     gastric banding undone  11/2014   LAPAROSCOPIC CHOLECYSTECTOMY  ~ 2000   LAPAROSCOPIC GASTRIC BANDING  2011   LAPAROSCOPIC REVISION VENTRICULAR-PERITONEAL (V-P) SHUNT N/A 06/23/2017   Procedure: Shunt Placment stereotactic ventricular catheter placement and general surgery abdominal exposure  (Laparoscopic vs. Open) Brain Lab;  Surgeon: Clovis Riley, MD;  Location: [facility];  Service: General;  Laterality: N/A;   LAPAROSCOPY N/A 06/23/2017   Procedure:  LAPAROSCOPY DIAGNOSTIC;  Surgeon: Clovis Riley, MD;  Location: [facility];  Service: General;  Laterality: N/A;   LUMBAR LAMINECTOMY/DECOMPRESSION MICRODISCECTOMY N/A 07/23/2016   Procedure: Decompressive Lumbar Laminectomy Lumbar two-three  for Repair of  Cerberal Spinal Fluid  Leak;  Surgeon: Kary Kos, MD;  Location: [facility];  Service: Neurosurgery;  Laterality: N/A;   LUMBAR LAMINECTOMY/DECOMPRESSION MICRODISCECTOMY Left 03/01/2019   Procedure: LEFT LUMBAR THREE-FOUR EXTRAFORAMINAL MICRODISCECTOMY;  Surgeon: Kary Kos, MD;  Location: [place] [facility];  Service: Neurosurgery;  Laterality: Left;   PLACEMENT LUMBOPERITONEAL SHUNT FOR CEREBROSPINAL FLUID DIVERSION AWAY FROM C8 PERINEURAL CYST  09-05-2003  &  12-24-2003   08-31-2003  REMOVAL SHUNT   POSTERIOR CERVICAL LAMINECTOMY  03-01-2001   C7-8 and Exploration C8 perineural cyst   POSTERIOR LUMBAR LAMINECTOMY L4-5/  DISKECTOMY L5-S1  05-20-2005   RE-DO DECOMPRESSION LAMINECTOMY AND FUSION L4-5  &  L5-S1  12-20-2006   02-04-2007--  RE-EXPLORATION L4 to S1, I & D LUMBAR WOUND HEMATOMA   REDUCTION MAMMAPLASTY     SHUNT REMOVAL N/A 07/10/2016   Procedure: SHUNT REMOVAL;  Surgeon: Kary Kos, MD;  Location: [facility];  Service: Neurosurgery;  Laterality: N/A;  SHUNT REMOVAL   SPINAL CORD STIMULATOR INSERTION N/A 04/26/2015   Procedure: LUMBAR SPINAL CORD STIMULATOR INSERTION;  Surgeon: Clydell Hakim, MD;  Location: [facility];  Service: Neurosurgery;  Laterality: N/A;  LUMBAR SPINAL CORD STIMULATOR INSERTION   SPINAL CORD STIMULATOR REMOVAL N/A 06/28/2019   Procedure: Removal of dorsal column stimulator and generator -;  Surgeon: Kary Kos, MD;  Location: [facility];  Service: Neurosurgery;  Laterality: N/A;   TENNIS ELBOW RELEASE/NIRSCHEL PROCEDURE Left 12/13/2014   Procedure: LEFT ELBOW LATERAL EPICONDYLAR DEBRIDEMENT AND OR REPAIR -RECONSTRUCTION ;  Surgeon: Iran Planas, MD;  Location: [facility];  Service: Orthopedics;  Laterality: Left;   TOTAL  ABDOMINAL HYSTERECTOMY  1994   w/  Bilateral Salpinoophorectomy   VENTRICULOPERITONEAL SHUNT N/A 06/23/2017   Procedure: VENTRICULAR-PERITONEAL SHUNT INSERTION WITH ABDOMINAL LAPARASCOPY AND BRAINLAB NAVIGATION;  Surgeon: Kary Kos, MD;  Location: [facility];  Service: Neurosurgery;  Laterality: N/A;    Social History   Socioeconomic History   Marital status: Divorced    Spouse name: Not on file   Number of children: 2   Years of education: Assoc   Highest education level: Associate degree: academic program  Occupational History   Occupation: Disabled   Tobacco Use   Smoking status: Never   Smokeless tobacco: Never  Vaping Use   Vaping Use: Never used  Substance and Sexual Activity   Alcohol use: Yes    Comment: once a year   Drug use: No   Sexual activity: Not Currently    Birth control/protection: Surgical  Other Topics Concern   Not on file  Social History Narrative  Caffeine: occasionally    Mrs Yokoyama is divorced, on permanent disability and lives at home with her mother and her daughter. She is independent in her care needs. She denies concern with transportation to medical appointments She is on a fixed income   Social Determinants of Radio broadcast assistant Strain: Low Risk    Difficulty of Paying Living Expenses: Not very hard  Food Insecurity: Food Insecurity Present   Worried About Charity fundraiser in the Last Year: Sometimes true   Arboriculturist in the Last Year: Never true  Transportation Needs: No Transportation Needs   Lack of Transportation (Medical): No   Lack of Transportation (Non-Medical): No  Physical Activity: Inactive   Days of Exercise per Week: 0 days   Minutes of Exercise per Session: 0 min  Stress: Stress Concern Present   Feeling of Stress : To some extent  Social Connections: Socially Isolated   Frequency of Communication with Friends and Family: More than three times a week   Frequency of Social Gatherings with Friends and Family:  Once a week   Attends Religious Services: Never   Marine scientist or Organizations: No   Attends Music therapist: Never   Marital Status: Divorced    Family History  Problem Relation Age of Onset   Depression Other    Colon cancer Father    Ovarian cancer Maternal Grandmother    Pancreatic cancer Maternal Grandfather     Health Maintenance  Topic Date Due   Zoster Vaccines- Shingrix (1 of 2) Never done   COVID-19 Vaccine (3 - Mixed Product risk series) 12/05/2019   TETANUS/TDAP  07/24/2021 (Originally 05/19/2019)   Hepatitis C Screening  07/24/2021 (Originally 08/23/1985)   MAMMOGRAM  07/27/2022   COLONOSCOPY (Pts 45-85yrs Insurance coverage will need to be confirmed)  07/05/2023   INFLUENZA VACCINE  Completed   HIV Screening  Completed   HPV VACCINES  Aged Out     ----------------------------------------------------------------------------------------------------------------------------------------------------------------------------------------------------------------- Physical Exam BP (!) 144/87 (BP Location: Left Arm, Patient Position: Sitting, Cuff Size: Normal)    Pulse (!) 105    Ht 5\' 3"  (1.6 m)    Wt 171 lb (77.6 kg)    SpO2 98%    BMI 30.29 kg/m   Physical Exam Constitutional:      Appearance: Normal appearance.  Eyes:     General: No scleral icterus. Cardiovascular:     Rate and Rhythm: Normal rate and regular rhythm.  Pulmonary:     Effort: Pulmonary effort is normal.     Breath sounds: Normal breath sounds.  Abdominal:     General: Abdomen is flat.     Palpations: Abdomen is soft.     Tenderness: There is no right CVA tenderness or left CVA tenderness.  Musculoskeletal:     Cervical back: Neck supple.  Neurological:     Mental Status: She is alert.  Psychiatric:        Mood and Affect: Mood normal.     ------------------------------------------------------------------------------------------------------------------------------------------------------------------------------------------------------------------- Assessment and Plan  Nephrolithiasis Recent obstructive stone causing pyelonephritis which also resulted in sepsis.  She has recovered fairly well from this and has upcoming lithotripsy for management of stone.  She has completed course of antibiotics at this point.  AKI (acute kidney injury) ([place]) Update renal function today.   No orders of the defined types were placed in this encounter.   Return in about 7 weeks (around 08/04/2021) for F/u AKI/Sepsis.    This visit  occurred during the SARS-CoV-2 public health emergency.  Safety protocols were in place, including screening questions prior to the visit, additional usage of staff PPE, and extensive cleaning of exam room while observing appropriate contact time as indicated for disinfecting solutions.

## 2021-06-22 NOTE — Assessment & Plan Note (Signed)
Recent obstructive stone causing pyelonephritis which also resulted in sepsis.  She has recovered fairly well from this and has upcoming lithotripsy for management of stone.  She has completed course of antibiotics at this point.

## 2021-06-22 NOTE — Assessment & Plan Note (Signed)
Update renal function today. 

## 2021-06-27 ENCOUNTER — Encounter: Payer: Self-pay | Admitting: Family Medicine

## 2021-06-30 DIAGNOSIS — M5417 Radiculopathy, lumbosacral region: Secondary | ICD-10-CM | POA: Diagnosis not present

## 2021-06-30 DIAGNOSIS — M542 Cervicalgia: Secondary | ICD-10-CM | POA: Diagnosis not present

## 2021-06-30 DIAGNOSIS — G8929 Other chronic pain: Secondary | ICD-10-CM | POA: Diagnosis not present

## 2021-06-30 DIAGNOSIS — R69 Illness, unspecified: Secondary | ICD-10-CM | POA: Diagnosis not present

## 2021-06-30 DIAGNOSIS — M5440 Lumbago with sciatica, unspecified side: Secondary | ICD-10-CM | POA: Diagnosis not present

## 2021-06-30 DIAGNOSIS — M7061 Trochanteric bursitis, right hip: Secondary | ICD-10-CM | POA: Diagnosis not present

## 2021-06-30 DIAGNOSIS — M961 Postlaminectomy syndrome, not elsewhere classified: Secondary | ICD-10-CM | POA: Diagnosis not present

## 2021-06-30 DIAGNOSIS — M79605 Pain in left leg: Secondary | ICD-10-CM | POA: Diagnosis not present

## 2021-06-30 DIAGNOSIS — Z79899 Other long term (current) drug therapy: Secondary | ICD-10-CM | POA: Diagnosis not present

## 2021-06-30 MED ORDER — OZEMPIC (0.25 OR 0.5 MG/DOSE) 2 MG/1.5ML ~~LOC~~ SOPN: PEN_INJECTOR | SUBCUTANEOUS | 0 refills | Status: DC

## 2021-06-30 MED ORDER — OZEMPIC (1 MG/DOSE) 4 MG/3ML ~~LOC~~ SOPN: 1.0000 mg | PEN_INJECTOR | SUBCUTANEOUS | 0 refills | Status: DC

## 2021-06-30 NOTE — Telephone Encounter (Signed)
Updated rx sent

## 2021-07-03 ENCOUNTER — Ambulatory Visit: Payer: Medicare HMO | Admitting: Family Medicine

## 2021-07-03 DIAGNOSIS — N2889 Other specified disorders of kidney and ureter: Secondary | ICD-10-CM | POA: Diagnosis not present

## 2021-07-03 DIAGNOSIS — Z960 Presence of urogenital implants: Secondary | ICD-10-CM | POA: Diagnosis not present

## 2021-07-03 DIAGNOSIS — N200 Calculus of kidney: Secondary | ICD-10-CM | POA: Diagnosis not present

## 2021-07-03 DIAGNOSIS — N209 Urinary calculus, unspecified: Secondary | ICD-10-CM | POA: Diagnosis not present

## 2021-07-07 IMAGING — RF DG LUMBAR SPINE 2-3V
1 series · 2 of 2 positions shown · non-contrast
Comparison: 04/26/2019

CLINICAL DATA: L3-4 fusion

EXAM:
LUMBAR SPINE - 2-3 VIEW; DG C-ARM 1-60 MIN

[Series 1: run · 2 of 2 slices shown]
[im 1/2]
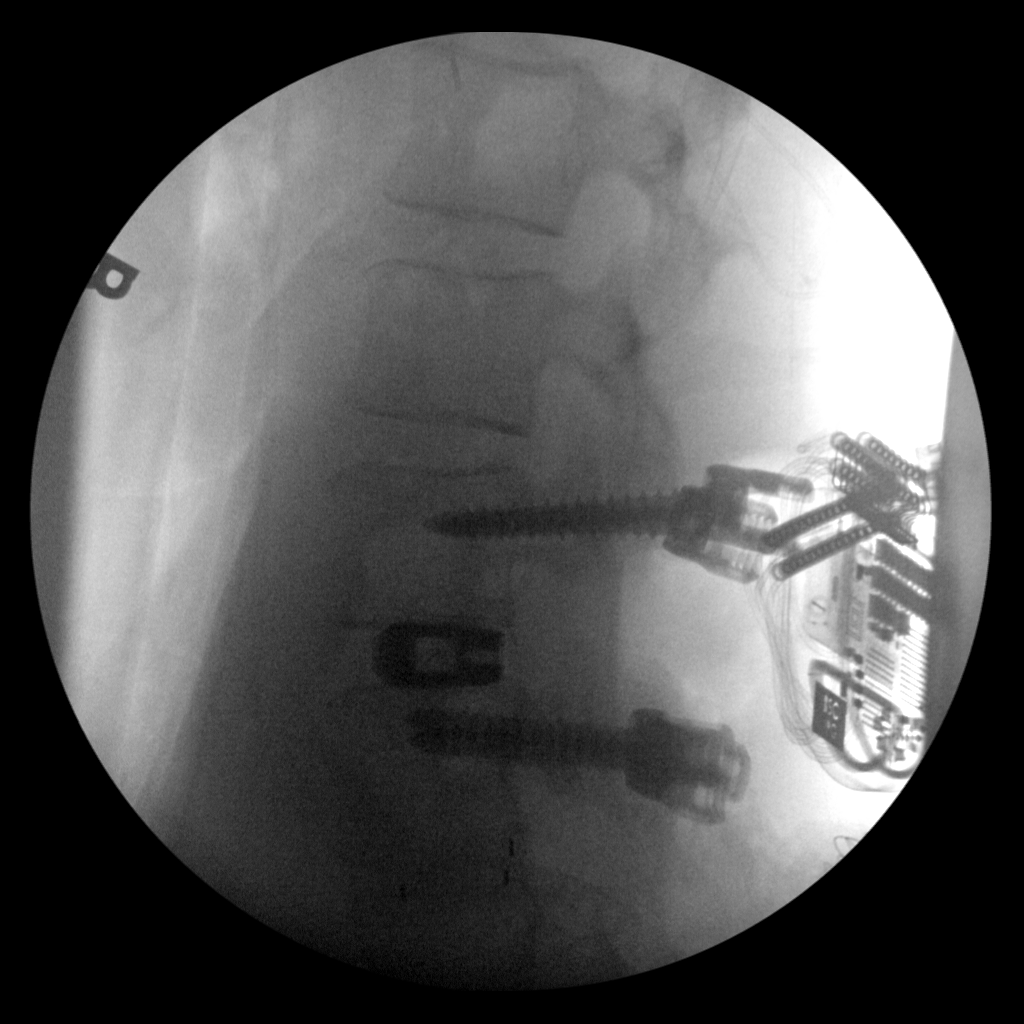
[im 2/2]
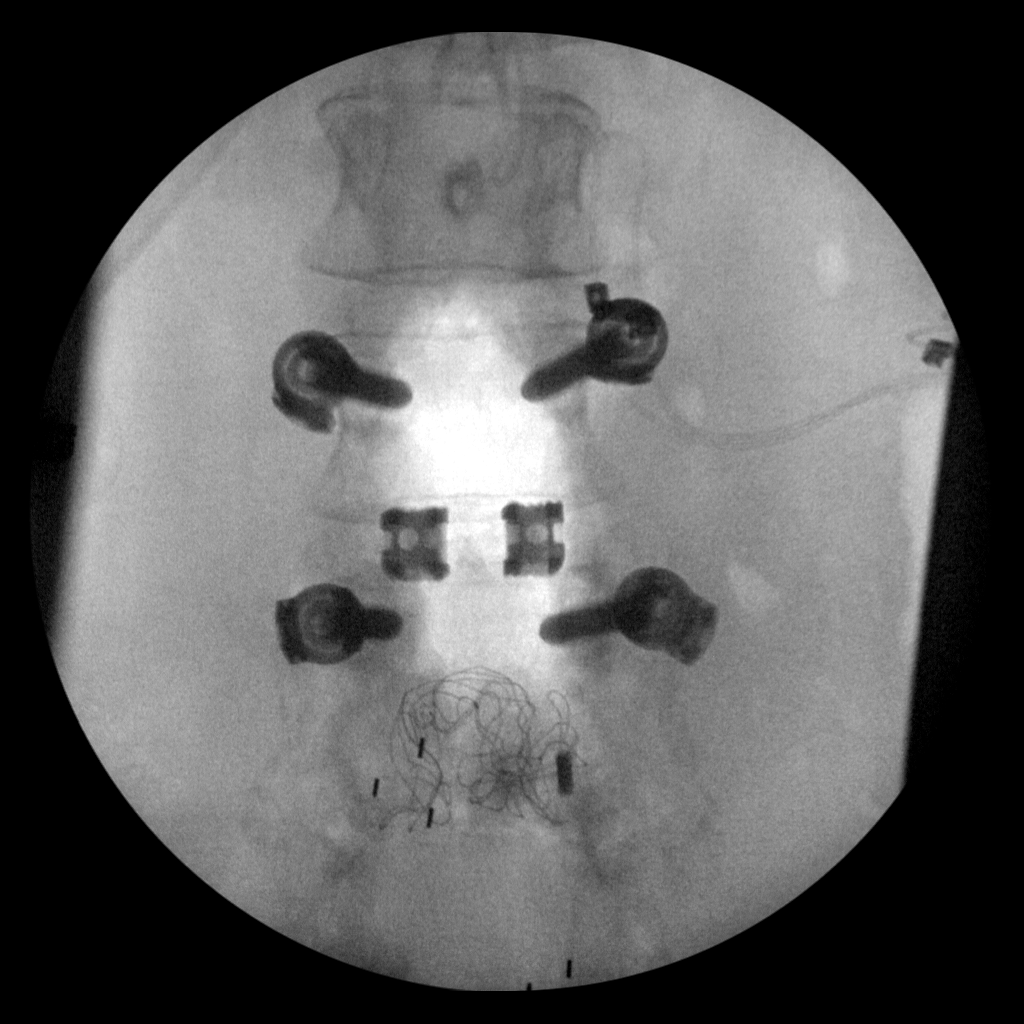

[2 of 2 positions shown; findings below may reference images not displayed]

FLUOROSCOPY TIME:  Fluoroscopy Time:  31 seconds

Radiation Exposure Index (if provided by the fluoroscopic device):
Not available

Number of Acquired Spot Images: 2
FINDINGS: Interbody fusion is now seen at L3-4 with pedicle screw placement.
Previously seen hardware extending from L4-S1 is been removed.
IMPRESSION: L3-4 fusion.

## 2021-07-07 IMAGING — RF DG C-ARM 1-60 MIN
1 series · 2 of 2 positions shown · non-contrast
Comparison: 04/26/2019

CLINICAL DATA: L3-4 fusion

EXAM:
LUMBAR SPINE - 2-3 VIEW; DG C-ARM 1-60 MIN

[Series 1: run · 2 of 2 slices shown]
[im 1/2]
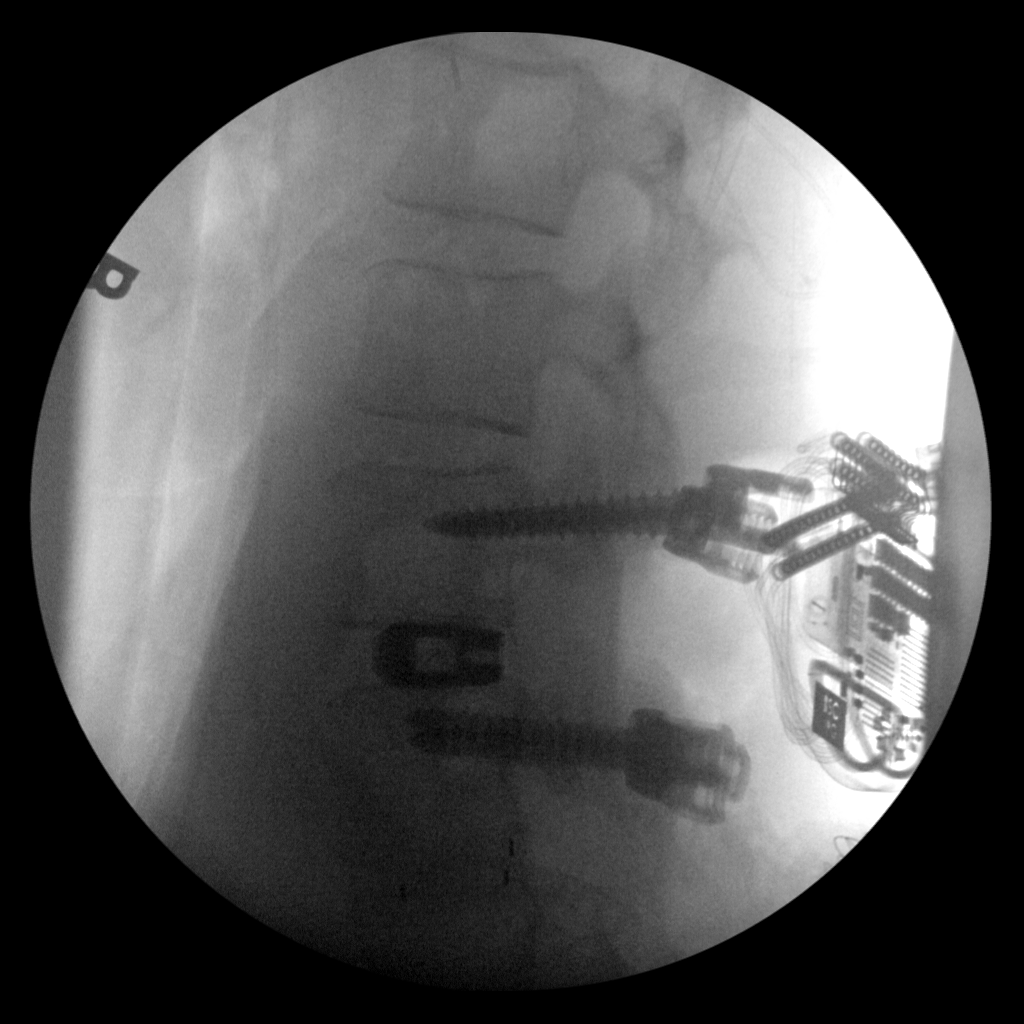
[im 2/2]
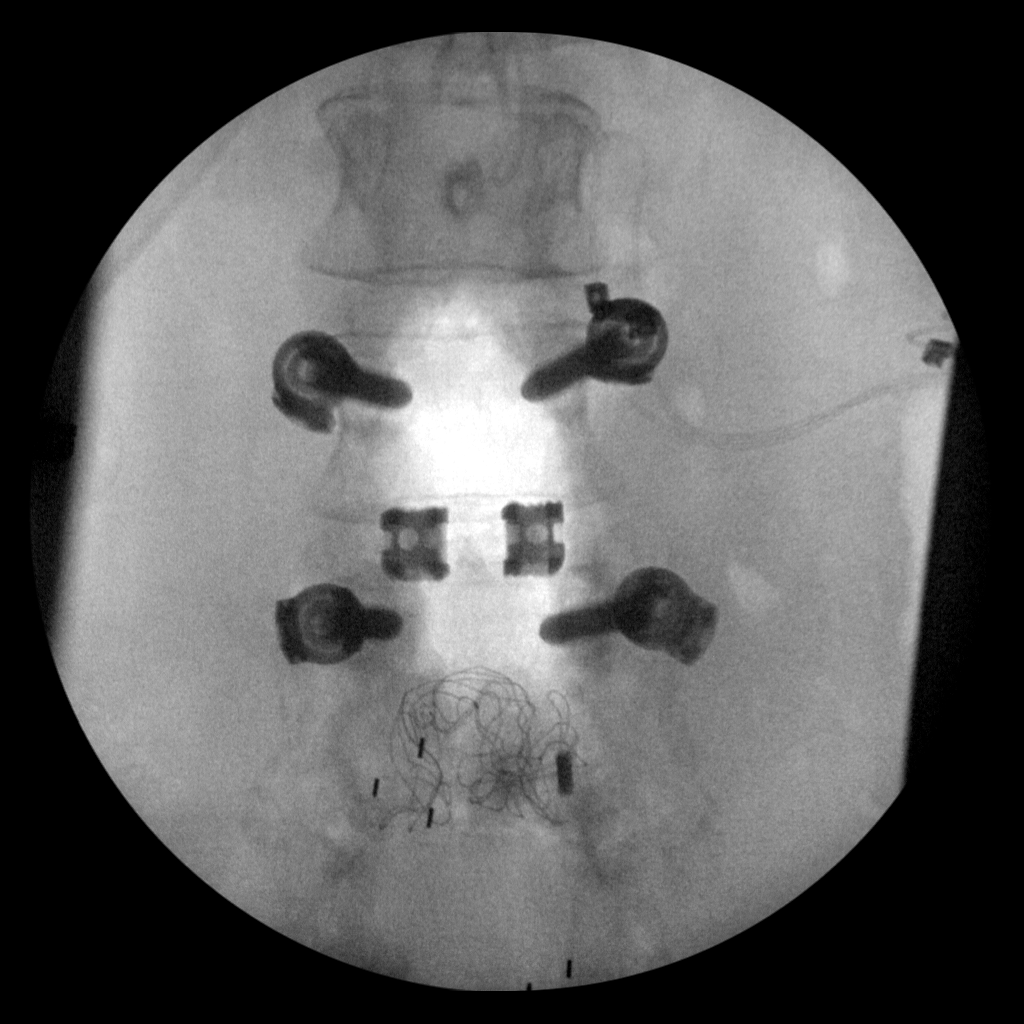

[2 of 2 positions shown; findings below may reference images not displayed]

FLUOROSCOPY TIME:  Fluoroscopy Time:  31 seconds

Radiation Exposure Index (if provided by the fluoroscopic device):
Not available

Number of Acquired Spot Images: 2
FINDINGS: Interbody fusion is now seen at L3-4 with pedicle screw placement.
Previously seen hardware extending from L4-S1 is been removed.
IMPRESSION: L3-4 fusion.

## 2021-07-16 DIAGNOSIS — R3989 Other symptoms and signs involving the genitourinary system: Secondary | ICD-10-CM | POA: Diagnosis not present

## 2021-07-28 ENCOUNTER — Ambulatory Visit (INDEPENDENT_AMBULATORY_CARE_PROVIDER_SITE_OTHER): Payer: Medicare HMO | Admitting: Family Medicine

## 2021-07-28 ENCOUNTER — Other Ambulatory Visit: Payer: Self-pay

## 2021-07-28 VITALS — BP 141/89 | HR 94 | Ht 63.0 in | Wt 174.1 lb

## 2021-07-28 DIAGNOSIS — Z1231 Encounter for screening mammogram for malignant neoplasm of breast: Secondary | ICD-10-CM | POA: Diagnosis not present

## 2021-07-28 DIAGNOSIS — Z0000 Encounter for general adult medical examination without abnormal findings: Secondary | ICD-10-CM | POA: Diagnosis not present

## 2021-07-28 NOTE — Patient Instructions (Addendum)
[facility] Maintenance Summary and Written Plan of Care  [name] ,  Thank you for allowing me to perform your Medicare Annual Wellness Visit and for your ongoing commitment to your health.   Health Maintenance & Immunization History Health Maintenance  Topic Date Due   COVID-19 Vaccine (3 - Mixed Product risk series) 08/13/2021 (Originally 12/05/2019)   Zoster Vaccines- Shingrix (1 of 2) 10/28/2021 (Originally 08/24/1986)   TETANUS/TDAP  07/29/2022 (Originally 05/19/2019)   Hepatitis C Screening  07/29/2022 (Originally 08/23/1985)   MAMMOGRAM  07/27/2022   COLONOSCOPY (Pts 45-19yr Insurance coverage will need to be confirmed)  07/05/2023   INFLUENZA VACCINE  Completed   HIV Screening  Completed   HPV VACCINES  Aged Out   Immunization History  Administered Date(s) Administered   Influenza Split 02/15/2013   Influenza,inj,Quad PF,6+ Mos 01/13/2018, 01/10/2019, 02/19/2021   Influenza-Unspecified 02/10/2015, 02/11/2017   Moderna Sars-Covid-2 Vaccination 10/17/2019, 11/07/2019   PPD Test 10/17/2014   Tdap 05/18/2009    These are the patient goals that we discussed:  Goals Addressed               This Visit's Progress     Patient Stated (pt-stated)        Would like to loose 30 lbs         This is a list of Health Maintenance Items that are overdue or due now: Td vaccine Screening mammography Shingrix    Orders/Referrals Placed Today: Orders Placed This Encounter  Procedures   Mammogram 3D SCREEN BREAST BILATERAL    Standing Status:   Future    Standing Expiration Date:   07/29/2022    Scheduling Instructions:     Please call patient to schedule    Order Specific Question:   Reason for Exam (SYMPTOM  OR DIAGNOSIS REQUIRED)    Answer:   Breast cancer screening    Order Specific Question:   Is the patient pregnant?    Answer:   No    Order Specific Question:   Preferred imaging location?    Answer:   [facility]   (Contact our referral department at [phone] you have not spoken with someone about your referral appointment within the next 5 days)    Follow-up Plan Follow-up with MLuetta Nutting DO as planned Schedule your shingrix and tetanus at the pharmacy. Mammogram referral has been sent and they will call you to schedule. Medicare wellness visit in one year AVS printed and given to the patient.      Health Maintenance, Female Adopting a healthy lifestyle and getting preventive care are important in promoting health and wellness. Ask your health care provider about: The right schedule for you to have regular tests and exams. Things you can do on your own to prevent diseases and keep yourself healthy. What should I know about diet, weight, and exercise? Eat a healthy diet  Eat a diet that includes plenty of vegetables, fruits, low-fat dairy products, and lean protein. Do not eat a lot of foods that are high in solid fats, added sugars, or sodium. Maintain a healthy weight Body mass index (BMI) is used to identify weight problems. It estimates body fat based on height and weight. Your health care provider can help determine your BMI and help you achieve or maintain a healthy weight. Get regular exercise Get regular exercise. This is one of the most important things you can do for your health. Most adults should: Exercise for at least 150  minutes each week. The exercise should increase your heart rate and make you sweat (moderate-intensity exercise). Do strengthening exercises at least twice a week. This is in addition to the moderate-intensity exercise. Spend less time sitting. Even light physical activity can be beneficial. Watch cholesterol and blood lipids Have your blood tested for lipids and cholesterol at 54 years of age, then have this test every 5 years. Have your cholesterol levels checked more often if: Your lipid or cholesterol levels are high. You are older than 54  years of age. You are at high risk for heart disease. What should I know about cancer screening? Depending on your health history and family history, you may need to have cancer screening at various ages. This may include screening for: Breast cancer. Cervical cancer. Colorectal cancer. Skin cancer. Lung cancer. What should I know about heart disease, diabetes, and high blood pressure? Blood pressure and heart disease High blood pressure causes heart disease and increases the risk of stroke. This is more likely to develop in people who have high blood pressure readings or are overweight. Have your blood pressure checked: Every 3-5 years if you are 34-77 years of age. Every year if you are 8 years old or older. Diabetes Have regular diabetes screenings. This checks your fasting blood sugar level. Have the screening done: Once every three years after age 69 if you are at a normal weight and have a low risk for diabetes. More often and at a younger age if you are overweight or have a high risk for diabetes. What should I know about preventing infection? Hepatitis B If you have a higher risk for hepatitis B, you should be screened for this virus. Talk with your health care provider to find out if you are at risk for hepatitis B infection. Hepatitis C Testing is recommended for: Everyone born from [birth year] through 1963. Anyone with known risk factors for hepatitis C. Sexually transmitted infections (STIs) Get screened for STIs, including gonorrhea and chlamydia, if: You are sexually active and are younger than 54 years of age. You are older than 54 years of age and your health care provider tells you that you are at risk for this type of infection. Your sexual activity has changed since you were last screened, and you are at increased risk for chlamydia or gonorrhea. Ask your health care provider if you are at risk. Ask your health care provider about whether you are at high risk for HIV. Your  health care provider may recommend a prescription medicine to help prevent HIV infection. If you choose to take medicine to prevent HIV, you should first get tested for HIV. You should then be tested every 3 months for as long as you are taking the medicine. Pregnancy If you are about to stop having your period (premenopausal) and you may become pregnant, seek counseling before you get pregnant. Take 400 to 800 micrograms (mcg) of folic acid every day if you become pregnant. Ask for birth control (contraception) if you want to prevent pregnancy. Osteoporosis and menopause Osteoporosis is a disease in which the bones lose minerals and strength with aging. This can result in bone fractures. If you are 69 years old or older, or if you are at risk for osteoporosis and fractures, ask your health care provider if you should: Be screened for bone loss. Take a calcium or vitamin D supplement to lower your risk of fractures. Be given hormone replacement therapy (HRT) to treat symptoms of menopause. Follow these instructions  at home: Alcohol use Do not drink alcohol if: Your health care provider tells you not to drink. You are pregnant, may be pregnant, or are planning to become pregnant. If you drink alcohol: Limit how much you have to: 0-1 drink a day. Know how much alcohol is in your drink. In the [place] one drink equals one 12 oz bottle of beer (355 mL), one 5 oz glass of wine (148 mL), or one 1 oz glass of hard liquor (44 mL). Lifestyle Do not use any products that contain nicotine or tobacco. These products include cigarettes, chewing tobacco, and vaping devices, such as e-cigarettes. If you need help quitting, ask your health care provider. Do not use street drugs. Do not share needles. Ask your health care provider for help if you need support or information about quitting drugs. General instructions Schedule regular health, dental, and eye exams. Stay current with your vaccines. Tell your  health care provider if: You often feel depressed. You have ever been abused or do not feel safe at home. Summary Adopting a healthy lifestyle and getting preventive care are important in promoting health and wellness. Follow your health care provider's instructions about healthy diet, exercising, and getting tested or screened for diseases. Follow your health care provider's instructions on monitoring your cholesterol and blood pressure. This information is not intended to replace advice given to you by your health care provider. Make sure you discuss any questions you have with your health care provider. Document Revised: 09/23/2020 Document Reviewed: 09/23/2020 Elsevier Patient Education  [place].

## 2021-07-28 NOTE — Progress Notes (Signed)
MEDICARE ANNUAL WELLNESS VISIT  07/28/2021  Subjective:  Katrina Fernandez is a 56 y.o. female patient of Katrina Nutting, DO who had a Medicare Annual Wellness Visit today. [name] is Disabled and lives with their family. she has 2 children. she reports that she is socially active and does interact with friends/family regularly. she is minimally physically active and enjoys quilting and crocheting.  Patient Care Team: Katrina Nutting, DO as PCP - General (Family Medicine) Dr. Damien Fusi (Pain Medicine) Kary Kos, MD as Consulting Physician (Neurosurgery) Darius Bump, [facility] (Pharmacist) Darius Bump, [facility] as Pharmacist (Pharmacist)  Advanced Directives 07/28/2021 07/24/2020 06/28/2019 06/26/2019 02/27/2019 11/09/2018 06/21/2017  Does Patient Have a Medical Advance Directive? No No No No No No No  Would patient like information on creating a medical advance directive? No - Patient declined No - Patient declined No - Guardian declined - No - Patient declined No - Patient declined No - Patient declined    Hospital Utilization Over the Past 12 Months: # of hospitalizations or ER visits: 1 # of surgeries: 1  Review of Systems    Patient reports that her overall health is better when compared to last year.  Review of Systems: History obtained from chart review and the patient  All other systems negative.  Pain Assessment Pain : 0-10 Pain Score: 5  Pain Type: Chronic pain Pain Location: Back Pain Orientation: Lower Pain Descriptors / Indicators: Constant Pain Onset: More than a month ago Pain Frequency: Constant Pain Relieving Factors: patient states "not really"  Pain Relieving Factors: patient states "not really"  Current Medications & Allergies (verified) Allergies as of 07/28/2021       Reactions   Penicillins Anaphylaxis   11/28/20 - patient tolerated Keflex w/o adverse effects   Tape Rash   Plastic tape, silicone tape, Steri Strips   Bactrim [sulfamethoxazole-trimethoprim]  Nausea Only   Lactose Nausea And Vomiting, Swelling       Paxlovid [nirmatrelvir-ritonavir] Nausea And Vomiting   Chlorhexidine Rash, Other (See Comments)   Dermatitis - Allergic        Medication List        Accurate as of July 28, 2021  8:30 AM. If you have any questions, ask your nurse or doctor.          ciprofloxacin 500 MG tablet Commonly known as: CIPRO SMARTSIG:1 Tablet(s) By Mouth Every 12 Hours   DULoxetine 60 MG capsule Commonly known as: CYMBALTA TAKE 2 CAPSULES BY MOUTH EVERY [name]   gabapentin 100 MG capsule Commonly known as: NEURONTIN Take 100 mg by mouth at bedtime.   methadone 5 MG tablet Commonly known as: DOLOPHINE Take 2.5 mg by mouth daily.   methocarbamol 500 MG tablet Commonly known as: ROBAXIN Take 500 mg by mouth every 6 (six) hours as needed for muscle spasms.   montelukast 10 MG tablet Commonly known as: SINGULAIR TAKE 1 TABLET BY MOUTH EVERYDAY AT BEDTIME   Narcan 4 MG/0.1ML Liqd nasal spray kit Generic drug: naloxone Place 0.4 mg into the nose once.   ondansetron 4 MG disintegrating tablet Commonly known as: ZOFRAN-ODT Take 1 tablet (4 mg total) by mouth every 8 (eight) hours as needed for nausea or vomiting.   polyethylene glycol 17 g packet Commonly known as: MIRALAX / GLYCOLAX Take 17 g by mouth daily as needed for mild constipation or moderate constipation.   pramipexole 0.5 MG tablet Commonly known as: MIRAPEX TAKE 1 TABLET BY MOUTH AT BEDTIME.   Semaglutide (2 MG/DOSE) 8 MG/3ML  Sopn Inject 2 mg as directed once a week. What changed: Another medication with the same name was changed. Make sure you understand how and when to take each.   Ozempic (0.25 or 0.5 MG/DOSE) 2 MG/1.5ML Sopn Generic drug: Semaglutide(0.25 or 0.5MG/DOS) Take 0.1048m weekly x4 weeks then increase to 1348m What changed: additional instructions   Ozempic (1 MG/DOSE) 4 MG/3ML Sopn Generic drug: Semaglutide (1 MG/DOSE) Inject 1 mg as directed once  a week. Take 48m548meekly x4 weeks then increase to 2mg98mhat changed: Another medication with the same name was changed. Make sure you understand how and when to take each.   simvastatin 20 MG tablet Commonly known as: ZOCOR TAKE 1 TABLET BY MOUTH EVERY [name]   traMADol 50 MG tablet Commonly known as: ULTRAM Take 50-100 mg by mouth every 8 (eight) hours as needed (Pain).        History (reviewed): Past Medical History:  Diagnosis Date   Anemia    Anxiety    Arthritis    "back and knees" (07/23/2016)   Chiari malformation type I (HCC)[place] 2014   Chronic back pain    buldging, DDD; "neck and lower back" (07/23/2016)   Chronic constipation    takes Miralax daily   Chronic pain syndrome    takes Methadone and Keppra daily   Depression    takes Effexor daily   Endometriosis    1995   Fibromyalgia    GERD (gastroesophageal reflux disease)    not taking anything at the present time   History of DVT of lower extremity 07/2012   LEFT LEG --  3/ 2014.Was on a blood transfusion for 6 months   History of kidney stones    History of MRSA infection 10 yrs ago   Hyperlipidemia    takes Simvastatin daily   Migraine    "daily recently" (07/23/2016)   Muscle spasm    takes Zanaflex nightly   Peripheral edema    takes Lasix daily as needed   Peripheral neuropathy    Pleurisy 2000   Pneumonia    hx of-7-8 yrs ago   PONV (postoperative nausea and vomiting)    Seasonal allergies    takes Claritin daily   Spinal headache    "since 2002; still get them; try to manage it w/lying flat"   Past Surgical History:  Procedure Laterality Date   ANTERIOR CERVICAL DECOMP/DISCECTOMY FUSION  07/25/2009   C5 -- C6   APPENDECTOMY  age 77  37PPLICATION OF CRANIAL NAVIGATION N/A 06/23/2017   Procedure: APPLICATION OF CRANIAL NAVIGATION;  Surgeon: CramKary Kos;  [facility];  Laterality: N/A;   BACK SURGERY     BRAIN SURGERY     VP shunt   BREAST REDUCTION SURGERY  Bilateral 06/07/2013   Procedure: BILATERAL MAMMARY REDUCTION  (BREAST);  Surgeon: ClaiTheodoro Kos;  [place];  Laterality: Bilateral;   BREAST REDUCTION SURGERY Bilateral 06/07/2013   Procedure:  LIPOSUCTION;  Surgeon: ClaiTheodoro Kos;  [place];  Laterality: Bilateral;   CARPAL TUNNEL RELEASE Bilateral 2001   COLONOSCOPY  X 2   "polyps were benign"   CYSTOSCOPY WITH RETROGRADE PYELOGRAM, URETEROSCOPY AND STENT PLACEMENT Left 05/2021   (in my chart)   DILATION AND CURETTAGE OF UTERUS     ESOPHAGOGASTRODUODENOSCOPY     gastric banding undone  11/2014   LAPAROSCOPIC CHOLECYSTECTOMY  ~ 2000   LAPAROSCOPIC  GASTRIC BANDING  2011   LAPAROSCOPIC REVISION VENTRICULAR-PERITONEAL (V-P) SHUNT N/A 06/23/2017   Procedure: Shunt Placment stereotactic ventricular catheter placement and general surgery abdominal exposure  (Laparoscopic vs. Open) Brain Lab;  Surgeon: Clovis Riley, MD;  Location: [place];  Service: General;  Laterality: N/A;   LAPAROSCOPY N/A 06/23/2017   Procedure: LAPAROSCOPY DIAGNOSTIC;  Surgeon: Clovis Riley, MD;  Location: [place];  Service: General;  Laterality: N/A;   LUMBAR LAMINECTOMY/DECOMPRESSION MICRODISCECTOMY N/A 07/23/2016   Procedure: Decompressive Lumbar Laminectomy Lumbar two-three  for Repair of  Cerberal Spinal Fluid  Leak;  Surgeon: Kary Kos, MD;  Location: [place];  Service: Neurosurgery;  Laterality: N/A;   LUMBAR LAMINECTOMY/DECOMPRESSION MICRODISCECTOMY Left 03/01/2019   Procedure: LEFT LUMBAR THREE-FOUR EXTRAFORAMINAL MICRODISCECTOMY;  Surgeon: Kary Kos, MD;  Location: [place];  Service: Neurosurgery;  Laterality: Left;   PLACEMENT LUMBOPERITONEAL SHUNT FOR CEREBROSPINAL FLUID DIVERSION AWAY FROM C8 PERINEURAL CYST  09-05-2003  &  12-24-2003   08-31-2003  REMOVAL SHUNT   POSTERIOR CERVICAL LAMINECTOMY  03/01/2001   C7-8 and Exploration C8 perineural cyst    POSTERIOR LUMBAR LAMINECTOMY L4-5/  DISKECTOMY L5-S1  05/20/2005   RE-DO DECOMPRESSION LAMINECTOMY AND FUSION L4-5  &  L5-S1  12/20/2006   02-04-2007--  RE-EXPLORATION L4 to S1, I & D LUMBAR WOUND HEMATOMA   REDUCTION MAMMAPLASTY     SHUNT REMOVAL N/A 07/10/2016   Procedure: SHUNT REMOVAL;  Surgeon: Kary Kos, MD;  Location: [place];  Service: Neurosurgery;  Laterality: N/A;  SHUNT REMOVAL   SPINAL CORD STIMULATOR INSERTION N/A 04/26/2015   Procedure: LUMBAR SPINAL CORD STIMULATOR INSERTION;  Surgeon: Clydell Hakim, MD;  Location: [place] [facility];  Service: Neurosurgery;  Laterality: N/A;  LUMBAR SPINAL CORD STIMULATOR INSERTION   SPINAL CORD STIMULATOR REMOVAL N/A 06/28/2019   Procedure: Removal of dorsal column stimulator and generator -;  Surgeon: Kary Kos, MD;  Location: [place];  Service: Neurosurgery;  Laterality: N/A;   TENNIS ELBOW RELEASE/NIRSCHEL PROCEDURE Left 12/13/2014   Procedure: LEFT ELBOW LATERAL EPICONDYLAR DEBRIDEMENT AND OR REPAIR -RECONSTRUCTION ;  Surgeon: Iran Planas, MD;  Location: [place];  Service: Orthopedics;  Laterality: Left;   TOTAL ABDOMINAL HYSTERECTOMY  1994   w/  Bilateral Salpinoophorectomy   VENTRICULOPERITONEAL SHUNT N/A 06/23/2017   Procedure: VENTRICULAR-PERITONEAL SHUNT INSERTION WITH ABDOMINAL LAPARASCOPY AND BRAINLAB NAVIGATION;  Surgeon: Kary Kos, MD;  Location: [place];  Service: Neurosurgery;  Laterality: N/A;   Family History  Problem Relation Age of Onset   Depression Other    Colon cancer Father    Ovarian cancer Maternal Grandmother    Pancreatic cancer Maternal Grandfather    Social History   Socioeconomic History   Marital status: Divorced    Spouse name: Not on file   Number of children: 2   Years of education: 14   Highest education level: Associate degree: academic program  Occupational History   Occupation: Disabled   Tobacco Use   Smoking status: Never   Smokeless tobacco: Never  Scientific laboratory technician Use:  Never used  Substance and Sexual Activity   Alcohol use: Yes    Comment: once a year   Drug use: No   Sexual activity: Not Currently    Birth control/protection: Surgical  Other Topics Concern   Not on file  Social History Narrative   Katrina Fernandez is divorced, on permanent disability and lives at home with her mother and her daughter. She is independent in her care needs. She denies  concern with transportation to medical appointments She is on a fixed income.   Social Determinants of Health   Financial Resource Strain: Low Risk    Difficulty of Paying Living Expenses: Not hard at all  Food Insecurity: No Food Insecurity   Worried About Charity fundraiser in the Last Year: Never true   [place] in the Last Year: Never true  Transportation Needs: No Transportation Needs   Lack of Transportation (Medical): No   Lack of Transportation (Non-Medical): No  Physical Activity: Inactive   Days of Exercise per Week: 0 days   Minutes of Exercise per Session: 0 min  Stress: No Stress Concern Present   Feeling of Stress : Not at all  Social Connections: Socially Isolated   Frequency of Communication with Friends and Family: More than three times a week   Frequency of Social Gatherings with Friends and Family: More than three times a week   Attends Religious Services: Never   Marine scientist or Organizations: No   Attends Archivist Meetings: Never   Marital Status: Divorced    Activities of Daily Living In your present state of health, do you have any difficulty performing the following activities: 07/28/2021  Hearing? N  Vision? N  Difficulty concentrating or making decisions? N  Walking or climbing stairs? N  Dressing or bathing? N  Doing errands, shopping? N  Preparing Food and eating ? N  Using the Toilet? N  In the past six months, have you accidently leaked urine? N  Do you have problems with loss of bowel control? N  Managing your Medications? N   Managing your Finances? N  Housekeeping or managing your Housekeeping? N  Some recent data might be hidden    Patient Education/Literacy How often do you need to have someone help you when you read instructions, pamphlets, or other written materials from your doctor or pharmacy?: 1 - Never What is the last grade level you completed in school?: Associates degree  Exercise Current Exercise Habits: The patient does not participate in regular exercise at present, Exercise limited by: None identified  Diet Patient reports consuming 2 meals a day and 0 snack(s) a day Patient reports that her primary diet is: Regular Patient reports that she does have regular access to food.   Depression Screen PHQ 2/9 Scores 07/28/2021 06/16/2021 07/24/2020 07/19/2019 11/09/2018 11/09/2018 10/11/2018  PHQ - 2 Score 1 1 0 - 0 0 2  PHQ- 9 Score 7 - 0 - - - 6  Exception Documentation - - - Patient refusal - - -     Fall Risk Fall Risk  07/28/2021 06/16/2021 07/24/2020 06/27/2018 01/13/2018  Falls in the past year? 0 0 0 0 No  Number falls in past yr: 0 0 0 0 -  Injury with Fall? 0 0 0 0 -  Risk for fall due to : No Fall Risks No Fall Risks No Fall Risks - -  Follow up Falls evaluation completed Falls evaluation completed Falls evaluation completed Falls evaluation completed -     Objective:   BP (!) 141/89 (BP Location: Left Arm, Patient Position: Sitting, Cuff Size: Normal)    Pulse 94    Ht '5\' 3"'  (1.6 m)    Wt 174 lb 1.9 oz (79 kg)    SpO2 98%    BMI 30.84 kg/m   Last Weight  Most recent update: 07/28/2021  8:06 AM    Weight  79 kg (174 lb  1.9 oz)             Body mass index is 30.84 kg/m.  Hearing/Vision  Katrina Fernandez did not have difficulty with hearing/understanding during the face-to-face interview Katrina Fernandez did not have difficulty with her vision during the face-to-face interview Reports that she has not had a formal eye exam by an eye care professional within the past year Reports that she has not had a  formal hearing evaluation within the past year  Cognitive Function: 6CIT Screen 07/28/2021 07/24/2020  What Year? 0 points 0 points  What month? 0 points 0 points  What time? 0 points 0 points  Count back from 20 0 points 0 points  Months in reverse 0 points 0 points  Repeat phrase 0 points 0 points  Total Score 0 0    Normal Cognitive Function Screening: Yes (Normal:0-7, Significant for Dysfunction: >8)  Immunization & Health Maintenance Record Immunization History  Administered Date(s) Administered   Influenza Split 02/15/2013   Influenza,inj,Quad PF,6+ Mos 01/13/2018, 01/10/2019, 02/19/2021   Influenza-Unspecified 02/10/2015, 02/11/2017   Moderna Sars-Covid-2 Vaccination 10/17/2019, 11/07/2019   PPD Test 10/17/2014   Tdap 05/18/2009    Health Maintenance  Topic Date Due   COVID-19 Vaccine (3 - Mixed Product risk series) 08/13/2021 (Originally 12/05/2019)   Zoster Vaccines- Shingrix (1 of 2) 10/28/2021 (Originally 08/24/1986)   TETANUS/TDAP  07/29/2022 (Originally 05/19/2019)   Hepatitis C Screening  07/29/2022 (Originally 08/23/1985)   MAMMOGRAM  07/27/2022   COLONOSCOPY (Pts 45-25yr Insurance coverage will need to be confirmed)  07/05/2023   INFLUENZA VACCINE  Completed   HIV Screening  Completed   HPV VACCINES  Aged Out       Assessment  This is a routine wellness examination for Katrina Fernandez  Health Maintenance: Due or Overdue There are no preventive care reminders to display for this patient.   Katrina Moorsdoes not need a referral for CCommercial Metals CompanyAssistance: Care Management:   no Social Work:    no Prescription Assistance:  no Nutrition/Diabetes Education:  no   Plan:  Personalized Goals  Goals Addressed               This Visit's Progress     Patient Stated (pt-stated)        Would like to loose 30 lbs       Personalized Health Maintenance & Screening Recommendations  Td vaccine Screening mammography Shingrix  Lung Cancer Screening  Recommended: no (Low Dose CT Chest recommended if Age 54-80years, 30 pack-year currently smoking OR have quit w/in past 15 years) Hepatitis C Screening recommended: yes HIV Screening recommended:  No  Advanced Directives: Written information was not given per the patient's request.  Referrals & Orders Orders Placed This Encounter  Procedures   Mammogram 3D SCREEN BREAST BILATERAL    Follow-up Plan Follow-up with Katrina Nutting DO as planned Schedule your shingrix and tetanus at the pharmacy. Mammogram referral has been sent and they will call you to schedule. Medicare wellness visit in one year AVS printed and given to the patient.   I have personally reviewed and noted the following in the patients chart:   Medical and social history Use of alcohol, tobacco or illicit drugs  Current medications and supplements Functional ability and status Nutritional status Physical activity Advanced directives List of other physicians Hospitalizations, surgeries, and ER visits in previous 12 months Vitals Screenings to include cognitive, depression, and falls Referrals and appointments  In addition, I have reviewed and discussed with  patient certain preventive protocols, quality metrics, and best practice recommendations. A written personalized care plan for preventive services as well as general preventive health recommendations were provided to patient.     Katrina Gens, RN  07/28/2021

## 2021-07-29 DIAGNOSIS — H52223 Regular astigmatism, bilateral: Secondary | ICD-10-CM | POA: Diagnosis not present

## 2021-07-29 DIAGNOSIS — Z0100 Encounter for examination of eyes and vision without abnormal findings: Secondary | ICD-10-CM | POA: Diagnosis not present

## 2021-07-29 DIAGNOSIS — E7800 Pure hypercholesterolemia, unspecified: Secondary | ICD-10-CM | POA: Diagnosis not present

## 2021-07-31 DIAGNOSIS — M542 Cervicalgia: Secondary | ICD-10-CM | POA: Diagnosis not present

## 2021-07-31 DIAGNOSIS — R519 Headache, unspecified: Secondary | ICD-10-CM | POA: Diagnosis not present

## 2021-08-05 ENCOUNTER — Ambulatory Visit (INDEPENDENT_AMBULATORY_CARE_PROVIDER_SITE_OTHER): Payer: Medicare HMO | Admitting: Family Medicine

## 2021-08-05 ENCOUNTER — Encounter: Payer: Self-pay | Admitting: Family Medicine

## 2021-08-05 ENCOUNTER — Other Ambulatory Visit: Payer: Self-pay | Admitting: Family Medicine

## 2021-08-05 ENCOUNTER — Other Ambulatory Visit: Payer: Self-pay

## 2021-08-05 DIAGNOSIS — R7303 Prediabetes: Secondary | ICD-10-CM | POA: Diagnosis not present

## 2021-08-05 DIAGNOSIS — N200 Calculus of kidney: Secondary | ICD-10-CM

## 2021-08-05 MED ORDER — TIRZEPATIDE 7.5 MG/0.5ML ~~LOC~~ SOAJ: 7.5000 mg | SUBCUTANEOUS | 0 refills | Status: DC

## 2021-08-05 NOTE — Assessment & Plan Note (Signed)
Switching from Ozempic to [facility].  Continue to work on dietary change. ?

## 2021-08-05 NOTE — Progress Notes (Signed)
Katrina Fernandez - 56 y.o. female MRN [id number]  Date of birth: 1965-10-13  Subjective Chief Complaint  Patient presents with   Follow-up    HPI Katrina Fernandez is a 56 year old female here today for follow-up visit.  Reports overall she is doing well.  She does continue to see urology due to history of kidney stones that previously resulted in urosepsis.  Having some mild lower abdominal and flank pain, has appointment to follow-up with urology.  She is taking Ozempic currently for management of glucose intolerance/prediabetes.  She is tolerating this well however she has not seen the weight loss that she initially seen with this.  She would like to discuss other options.  ROS:  A comprehensive ROS was completed and negative except as noted per HPI  Allergies  Allergen Reactions   Penicillins Anaphylaxis    11/28/20 - patient tolerated Keflex w/o adverse effects    Tape Rash    Plastic tape, silicone tape, Steri Strips   Bactrim [Sulfamethoxazole-Trimethoprim] Nausea Only   Lactose Nausea And Vomiting and Swelling        Paxlovid [Nirmatrelvir-Ritonavir] Nausea And Vomiting   Chlorhexidine Rash and Other (See Comments)    Dermatitis - Allergic    Past Medical History:  Diagnosis Date   Anemia    Anxiety    Arthritis    "back and knees" (07/23/2016)   Chiari malformation type I ([facility]) dx'd 2014   Chronic back pain    buldging, DDD; "neck and lower back" (07/23/2016)   Chronic constipation    takes Miralax daily   Chronic pain syndrome    takes Methadone and Keppra daily   Depression    takes Effexor daily   Endometriosis    1995   Fibromyalgia    GERD (gastroesophageal reflux disease)    not taking anything at the present time   History of DVT of lower extremity 07/2012   LEFT LEG --  3/ 2014.Was on a blood transfusion for 6 months   History of kidney stones    History of MRSA infection 10 yrs ago   Hyperlipidemia    takes Simvastatin daily   Migraine    "daily recently"  (07/23/2016)   Muscle spasm    takes Zanaflex nightly   Peripheral edema    takes Lasix daily as needed   Peripheral neuropathy    Pleurisy 2000   Pneumonia    hx of-7-8 yrs ago   PONV (postoperative nausea and vomiting)    Seasonal allergies    takes Claritin daily   Spinal headache    "since 2002; still get them; try to manage it w/lying flat"    Past Surgical History:  Procedure Laterality Date   ANTERIOR CERVICAL DECOMP/DISCECTOMY FUSION  07/25/2009   C5 -- C6   APPENDECTOMY  age 51   APPLICATION OF CRANIAL NAVIGATION N/A 06/23/2017   Procedure: APPLICATION OF CRANIAL NAVIGATION;  Surgeon: Donalee Citrin, MD;  Location: [facility] OR;  Service: Neurosurgery;  Laterality: N/A;   BACK SURGERY     BRAIN SURGERY     VP shunt   BREAST REDUCTION SURGERY Bilateral 06/07/2013   Procedure: BILATERAL MAMMARY REDUCTION  (BREAST);  Surgeon: Wayland Denis, DO;  Location: [facility];  Service: Plastics;  Laterality: Bilateral;   BREAST REDUCTION SURGERY Bilateral 06/07/2013   Procedure:  LIPOSUCTION;  Surgeon: Wayland Denis, DO;  Location: [facility];  Service: Plastics;  Laterality: Bilateral;   CARPAL TUNNEL RELEASE Bilateral 2001   COLONOSCOPY  X 2   "polyps were benign"   CYSTOSCOPY WITH RETROGRADE PYELOGRAM, URETEROSCOPY AND STENT PLACEMENT Left 05/2021   (in my chart)   DILATION AND CURETTAGE OF UTERUS     ESOPHAGOGASTRODUODENOSCOPY     gastric banding undone  11/2014   LAPAROSCOPIC CHOLECYSTECTOMY  ~ 2000   LAPAROSCOPIC GASTRIC BANDING  2011   LAPAROSCOPIC REVISION VENTRICULAR-PERITONEAL (V-P) SHUNT N/A 06/23/2017   Procedure: Shunt Placment stereotactic ventricular catheter placement and general surgery abdominal exposure  (Laparoscopic vs. Open) Brain Lab;  Surgeon: Berna Bue, MD;  Location: [facility] OR;  Service: General;  Laterality: N/A;   LAPAROSCOPY N/A 06/23/2017   Procedure: LAPAROSCOPY DIAGNOSTIC;  Surgeon: Berna Bue, MD;  Location: [facility] OR;   Service: General;  Laterality: N/A;   LUMBAR LAMINECTOMY/DECOMPRESSION MICRODISCECTOMY N/A 07/23/2016   Procedure: Decompressive Lumbar Laminectomy Lumbar two-three  for Repair of  Cerberal Spinal Fluid  Leak;  Surgeon: Donalee Citrin, MD;  Location: [facility] OR;  Service: Neurosurgery;  Laterality: N/A;   LUMBAR LAMINECTOMY/DECOMPRESSION MICRODISCECTOMY Left 03/01/2019   Procedure: LEFT LUMBAR THREE-FOUR EXTRAFORAMINAL MICRODISCECTOMY;  Surgeon: Donalee Citrin, MD;  Location: [facility] OR;  Service: Neurosurgery;  Laterality: Left;   PLACEMENT LUMBOPERITONEAL SHUNT FOR CEREBROSPINAL FLUID DIVERSION AWAY FROM C8 PERINEURAL CYST  09-05-2003  &  12-24-2003   08-31-2003  REMOVAL SHUNT   POSTERIOR CERVICAL LAMINECTOMY  03/01/2001   C7-8 and Exploration C8 perineural cyst   POSTERIOR LUMBAR LAMINECTOMY L4-5/  DISKECTOMY L5-S1  05/20/2005   RE-DO DECOMPRESSION LAMINECTOMY AND FUSION L4-5  &  L5-S1  12/20/2006   02-04-2007--  RE-EXPLORATION L4 to S1, I & D LUMBAR WOUND HEMATOMA   REDUCTION MAMMAPLASTY     SHUNT REMOVAL N/A 07/10/2016   Procedure: SHUNT REMOVAL;  Surgeon: Donalee Citrin, MD;  Location: [facility] OR;  Service: Neurosurgery;  Laterality: N/A;  SHUNT REMOVAL   SPINAL CORD STIMULATOR INSERTION N/A 04/26/2015   Procedure: LUMBAR SPINAL CORD STIMULATOR INSERTION;  Surgeon: Odette Fraction, MD;  Location: [facility];  Service: Neurosurgery;  Laterality: N/A;  LUMBAR SPINAL CORD STIMULATOR INSERTION   SPINAL CORD STIMULATOR REMOVAL N/A 06/28/2019   Procedure: Removal of dorsal column stimulator and generator -;  Surgeon: Donalee Citrin, MD;  Location: [facility] OR;  Service: Neurosurgery;  Laterality: N/A;   TENNIS ELBOW RELEASE/NIRSCHEL PROCEDURE Left 12/13/2014   Procedure: LEFT ELBOW LATERAL EPICONDYLAR DEBRIDEMENT AND OR REPAIR -RECONSTRUCTION ;  Surgeon: Bradly Bienenstock, MD;  Location: [facility];  Service: Orthopedics;  Laterality: Left;   TOTAL ABDOMINAL HYSTERECTOMY  1994   w/  Bilateral Salpinoophorectomy    VENTRICULOPERITONEAL SHUNT N/A 06/23/2017   Procedure: VENTRICULAR-PERITONEAL SHUNT INSERTION WITH ABDOMINAL LAPARASCOPY AND BRAINLAB NAVIGATION;  Surgeon: Donalee Citrin, MD;  Location: [facility] OR;  Service: Neurosurgery;  Laterality: N/A;    Social History   Socioeconomic History   Marital status: Divorced    Spouse name: Not on file   Number of children: 2   Years of education: 14   Highest education level: Associate degree: academic program  Occupational History   Occupation: Disabled   Tobacco Use   Smoking status: Never   Smokeless tobacco: Never  Building services engineer Use: Never used  Substance and Sexual Activity   Alcohol use: Yes    Comment: once a year   Drug use: No   Sexual activity: Not Currently    Birth control/protection: Surgical  Other Topics Concern   Not on file  Social History Narrative   Mrs Musich is divorced, on  permanent disability and lives at home with her mother and her daughter. She is independent in her care needs. She denies concern with transportation to medical appointments She is on a fixed income.   Social Determinants of Health   Financial Resource Strain: Low Risk    Difficulty of Paying Living Expenses: Not hard at all  Food Insecurity: No Food Insecurity   Worried About Programme researcher, broadcasting/film/video in the Last Year: Never true   Ran Out of Food in the Last Year: Never true  Transportation Needs: No Transportation Needs   Lack of Transportation (Medical): No   Lack of Transportation (Non-Medical): No  Physical Activity: Inactive   Days of Exercise per Week: 0 days   Minutes of Exercise per Session: 0 min  Stress: No Stress Concern Present   Feeling of Stress : Not at all  Social Connections: Socially Isolated   Frequency of Communication with Friends and Family: More than three times a week   Frequency of Social Gatherings with Friends and Family: More than three times a week   Attends Religious Services: Never   Database administrator or  Organizations: No   Attends Engineer, structural: Never   Marital Status: Divorced    Family History  Problem Relation Age of Onset   Depression Other    Colon cancer Father    Ovarian cancer Maternal Grandmother    Pancreatic cancer Maternal Grandfather     Health Maintenance  Topic Date Due   COVID-19 Vaccine (3 - Mixed Product risk series) 08/13/2021 (Originally 12/05/2019)   Zoster Vaccines- Shingrix (1 of 2) 10/28/2021 (Originally 08/24/1986)   TETANUS/TDAP  07/29/2022 (Originally 05/19/2019)   Hepatitis C Screening  07/29/2022 (Originally 08/23/1985)   MAMMOGRAM  07/27/2022   COLONOSCOPY (Pts 45-66yrs Insurance coverage will need to be confirmed)  07/05/2023   INFLUENZA VACCINE  Completed   HIV Screening  Completed   HPV VACCINES  Aged Out     ----------------------------------------------------------------------------------------------------------------------------------------------------------------------------------------------------------------- Physical Exam BP 108/73 (BP Location: Left Arm, Patient Position: Sitting, Cuff Size: Normal)   Pulse 71   Ht 5\' 3"  (1.6 m)   Wt 177 lb (80.3 kg)   SpO2 98%   BMI 31.35 kg/m   Physical Exam  ------------------------------------------------------------------------------------------------------------------------------------------------------------------------------------------------------------------- Assessment and Plan  Prediabetes Switching from Ozempic to [facility].  Continue to work on dietary change.  Nephrolithiasis Management per urology.  Does have upcoming appointment.   Meds ordered this encounter  Medications   tirzepatide (MOUNJARO) 7.5 MG/0.5ML Pen    Sig: Inject 7.5 mg into the skin once a week.    Dispense:  6 mL    Refill:  0    Return in about 6 months (around 02/05/2022) for Prediabetes/BP.    This visit occurred during the SARS-CoV-2 public health emergency.  Safety protocols were in  place, including screening questions prior to the visit, additional usage of staff PPE, and extensive cleaning of exam room while observing appropriate contact time as indicated for disinfecting solutions.

## 2021-08-05 NOTE — Assessment & Plan Note (Signed)
Management per urology.  Does have upcoming appointment. ?

## 2021-08-11 ENCOUNTER — Encounter: Payer: Self-pay | Admitting: Family Medicine

## 2021-08-11 NOTE — Telephone Encounter (Signed)
Can we check to see if there is a PA pending on Mounjaro?

## 2021-08-12 ENCOUNTER — Telehealth: Payer: Self-pay

## 2021-08-12 NOTE — Telephone Encounter (Signed)
Initiated Prior authorization [id number] 7.'5MG'$ /0.5ML pen-injectors ?Via: Covermymeds ?Case/Key: [id number] ?Status: approved  ?Reason:approved from 05/18/21-12/31/223  ?Notified Pt via: Mychart ?

## 2021-08-13 DIAGNOSIS — N209 Urinary calculus, unspecified: Secondary | ICD-10-CM | POA: Diagnosis not present

## 2021-08-19 ENCOUNTER — Ambulatory Visit: Payer: Medicare HMO

## 2021-08-26 ENCOUNTER — Other Ambulatory Visit: Payer: Self-pay | Admitting: Neurosurgery

## 2021-08-27 ENCOUNTER — Other Ambulatory Visit: Payer: Self-pay | Admitting: Neurosurgery

## 2021-08-27 DIAGNOSIS — G96810 Intracranial hypotension, unspecified: Secondary | ICD-10-CM

## 2021-08-28 ENCOUNTER — Ambulatory Visit (INDEPENDENT_AMBULATORY_CARE_PROVIDER_SITE_OTHER): Payer: Medicare HMO

## 2021-08-28 ENCOUNTER — Other Ambulatory Visit: Payer: Self-pay | Admitting: Family Medicine

## 2021-08-28 DIAGNOSIS — Z1231 Encounter for screening mammogram for malignant neoplasm of breast: Secondary | ICD-10-CM | POA: Diagnosis not present

## 2021-08-28 DIAGNOSIS — Z0000 Encounter for general adult medical examination without abnormal findings: Secondary | ICD-10-CM

## 2021-09-06 ENCOUNTER — Other Ambulatory Visit: Payer: Self-pay | Admitting: Family Medicine

## 2021-09-15 ENCOUNTER — Encounter: Payer: Self-pay | Admitting: Family Medicine

## 2021-09-15 MED ORDER — TIRZEPATIDE 12.5 MG/0.5ML ~~LOC~~ SOAJ: 12.5000 mg | SUBCUTANEOUS | 0 refills | Status: DC

## 2021-09-15 MED ORDER — TIRZEPATIDE 10 MG/0.5ML ~~LOC~~ SOAJ: 10.0000 mg | SUBCUTANEOUS | 0 refills | Status: DC

## 2021-09-16 ENCOUNTER — Encounter: Payer: Self-pay | Admitting: Family Medicine

## 2021-09-16 ENCOUNTER — Other Ambulatory Visit: Payer: Self-pay | Admitting: Family Medicine

## 2021-09-17 ENCOUNTER — Other Ambulatory Visit: Payer: Self-pay | Admitting: Family Medicine

## 2021-09-18 ENCOUNTER — Encounter: Payer: Self-pay | Admitting: Family Medicine

## 2021-09-18 MED ORDER — TIRZEPATIDE 10 MG/0.5ML ~~LOC~~ SOAJ: 10.0000 mg | SUBCUTANEOUS | 0 refills | Status: DC

## 2021-09-22 ENCOUNTER — Other Ambulatory Visit: Payer: Medicare HMO

## 2021-09-25 DIAGNOSIS — M542 Cervicalgia: Secondary | ICD-10-CM | POA: Diagnosis not present

## 2021-09-25 DIAGNOSIS — R69 Illness, unspecified: Secondary | ICD-10-CM | POA: Diagnosis not present

## 2021-09-25 DIAGNOSIS — Z79899 Other long term (current) drug therapy: Secondary | ICD-10-CM | POA: Diagnosis not present

## 2021-09-25 DIAGNOSIS — G8929 Other chronic pain: Secondary | ICD-10-CM | POA: Diagnosis not present

## 2021-09-25 DIAGNOSIS — M961 Postlaminectomy syndrome, not elsewhere classified: Secondary | ICD-10-CM | POA: Diagnosis not present

## 2021-09-25 DIAGNOSIS — M5440 Lumbago with sciatica, unspecified side: Secondary | ICD-10-CM | POA: Diagnosis not present

## 2021-09-25 DIAGNOSIS — M79605 Pain in left leg: Secondary | ICD-10-CM | POA: Diagnosis not present

## 2021-09-25 DIAGNOSIS — Z5181 Encounter for therapeutic drug level monitoring: Secondary | ICD-10-CM | POA: Diagnosis not present

## 2021-09-25 DIAGNOSIS — M5417 Radiculopathy, lumbosacral region: Secondary | ICD-10-CM | POA: Diagnosis not present

## 2021-10-03 ENCOUNTER — Ambulatory Visit
Admission: RE | Admit: 2021-10-03 | Discharge: 2021-10-03 | Disposition: A | Discharge status: Home / Self Care | Payer: Medicare HMO | Source: Ambulatory Visit | Attending: Neurosurgery | Admitting: Neurosurgery

## 2021-10-03 DIAGNOSIS — G96810 Intracranial hypotension, unspecified: Secondary | ICD-10-CM

## 2021-10-07 ENCOUNTER — Other Ambulatory Visit: Payer: Self-pay | Admitting: Neurosurgery

## 2021-10-07 ENCOUNTER — Other Ambulatory Visit (HOSPITAL_COMMUNITY): Payer: Self-pay | Admitting: Neurosurgery

## 2021-10-07 DIAGNOSIS — G96810 Intracranial hypotension, unspecified: Secondary | ICD-10-CM

## 2021-10-17 ENCOUNTER — Ambulatory Visit (HOSPITAL_COMMUNITY)
Admission: RE | Admit: 2021-10-17 | Discharge: 2021-10-17 | Disposition: A | Discharge status: Home / Self Care | Payer: Medicare HMO | Source: Ambulatory Visit | Attending: Neurosurgery | Admitting: Neurosurgery

## 2021-10-17 ENCOUNTER — Ambulatory Visit (HOSPITAL_COMMUNITY): Payer: Medicare HMO

## 2021-10-17 ENCOUNTER — Encounter (HOSPITAL_COMMUNITY): Payer: Self-pay

## 2021-10-17 DIAGNOSIS — G96810 Intracranial hypotension, unspecified: Secondary | ICD-10-CM | POA: Diagnosis not present

## 2021-10-17 DIAGNOSIS — Z8679 Personal history of other diseases of the circulatory system: Secondary | ICD-10-CM | POA: Diagnosis not present

## 2021-10-17 DIAGNOSIS — Q048 Other specified congenital malformations of brain: Secondary | ICD-10-CM | POA: Diagnosis not present

## 2021-10-17 DIAGNOSIS — M4050 Lordosis, unspecified, site unspecified: Secondary | ICD-10-CM | POA: Diagnosis not present

## 2021-10-17 DIAGNOSIS — Z982 Presence of cerebrospinal fluid drainage device: Secondary | ICD-10-CM | POA: Diagnosis not present

## 2021-10-17 MED ORDER — IOHEXOL 300 MG/ML  SOLN
100.0000 mL | Freq: Once | INTRAMUSCULAR | Status: AC | PRN
  Administered 2021-10-17: 100 mL via INTRAVENOUS

## 2021-10-23 ENCOUNTER — Encounter: Payer: Self-pay | Admitting: Family Medicine

## 2021-10-23 ENCOUNTER — Other Ambulatory Visit: Payer: Self-pay | Admitting: Family Medicine

## 2021-10-24 MED ORDER — TIRZEPATIDE 12.5 MG/0.5ML ~~LOC~~ SOAJ: 12.5000 mg | SUBCUTANEOUS | 0 refills | Status: DC

## 2021-10-28 DIAGNOSIS — R519 Headache, unspecified: Secondary | ICD-10-CM | POA: Diagnosis not present

## 2021-10-28 DIAGNOSIS — Z6830 Body mass index (BMI) 30.0-30.9, adult: Secondary | ICD-10-CM | POA: Diagnosis not present

## 2021-11-06 ENCOUNTER — Encounter: Payer: Self-pay | Admitting: Family Medicine

## 2021-11-07 ENCOUNTER — Other Ambulatory Visit: Payer: Self-pay | Admitting: Family Medicine

## 2021-11-19 ENCOUNTER — Encounter: Payer: Self-pay | Admitting: *Deleted

## 2021-11-20 NOTE — Progress Notes (Signed)
Referring:  Kary Kos, MD [address],  [place] [address]  PCP: Luetta Nutting, DO  Neurology was asked to evaluate Katrina Fernandez, a 56 year old female for a chief complaint of headaches.  Our recommendations of care will be communicated by shared medical record.    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 She has had headaches for over 20 years. Has a history of migraines as well as chronic CSF leak form a perineural cyst. She previously had a lumboperitoneal shunt placed which was subsequently removed. Had a VP shunt placed in 2019 which did improve her headaches. She currently follows with NSGY for her VP shunt. [facility] 10/17/21 showed a stable shunt.   She currently has a headache every couple of weeks but they can last 3-4 days at a time. They are described as right occipital sharp and throbbing pain which radiates down her neck and behind her ear. Headaches are associated with photophobia, nausea, and vomiting. She will occasionally see lights in her vision associated with her headaches. Currently takes Excedrin as needed but does not find this helpful.  Headache History: Onset: ~20 years ago Triggers: bending forward Aura: lights, dots in her vision Location: right occiput radiating down her neck and behind her ear Quality/Description: throbbing, aching, sharp Associated Symptoms:  Photophobia: yes  Phonophobia: no  Nausea: yes Vomiting: yes Other symptoms: ear fullness,  dizziness (lightheadedness and "weird feeling") Worse with activity?: yes Duration of headaches: 3-4 days  Headache days per month: 8 Headache free days per month: 22  Current  Treatment: Abortive Excedrin - doesn't help  Preventative none  Prior Therapies                                 Diamox Topamax 100 mg BID Amitriptyline Cymbalta - causes overheating Effexor Gabapentin 100 mg QHS - leg swelling Lyrica - leg swelling Robaxin Excedrin VP shunt Blood patch Neck PT  LABS: CBC    Component Value Date/Time   WBC 8.8 06/16/2021 0000   RBC 4.30 06/16/2021 0000   HGB 12.6 06/16/2021 0000   HCT 37.9 06/16/2021 0000   PLT 369 06/16/2021 0000   MCV 88.1 06/16/2021 0000   MCH 29.3 06/16/2021 0000   MCHC 33.2 06/16/2021 0000   RDW 13.4 06/16/2021 0000   LYMPHSABS 1,883 06/16/2021 0000   MONOABS 0.8 06/29/2019 0645   EOSABS 70 06/16/2021 0000   BASOSABS 114 06/16/2021 0000      Latest Ref Rng & Units 06/16/2021   12:00 AM 11/24/2019    9:49 AM 06/29/2019    6:45 AM  CMP  Glucose 65 - 99 mg/dL 72  91  112   BUN 7 - 25 mg/dL '18  14  11   ' Creatinine 0.50 - 1.03 mg/dL 0.70  0.72  0.88   Sodium 135 - 146 mmol/L 140  142  145   Potassium 3.5 - 5.3 mmol/L 4.3  4.9  4.6   Chloride 98 - 110 mmol/L 104  104  104   CO2 20 - 32 mmol/L 27  32  29   Calcium 8.6 - 10.4 mg/dL 9.4  9.5  8.4   Total  Protein 6.1 - 8.1 g/dL 6.4  6.4    Total Bilirubin 0.2 - 1.2 mg/dL 0.4  0.6    AST 10 - 35 U/L 15  12    ALT 6 - 29 U/L 14  8       IMAGING:  [facility] 10/17/21: No acute abnormality.   Stable positioning of shunt catheter. Stable appearance of the ventricular system.  CT C-spine 10/17/21: 1. No acute osseous abnormality or evidence of significant degenerative spondylosis of the cervical spine. 2. Prior C5-6 ACDF with solid interbody fusion. 3. Unchanged congenital findings spinal dysraphism. Grossly stable right C7 perineural root sleeve cyst.  Imaging independently reviewed on November 21, 2021   Current Outpatient Medications on File Prior to Visit  Medication Sig Dispense Refill   DULoxetine (CYMBALTA) 60 MG capsule TAKE 2 CAPSULES BY MOUTH EVERY DAY 180  capsule 1   methadone (DOLOPHINE) 5 MG tablet Take 2.5 mg by mouth daily.   0   methocarbamol (ROBAXIN) 500 MG tablet Take 500 mg by mouth every 6 (six) hours as needed for muscle spasms.     montelukast (SINGULAIR) 10 MG tablet TAKE 1 TABLET BY MOUTH EVERYDAY AT BEDTIME 90 tablet 1   NARCAN 4 MG/0.1ML LIQD nasal spray kit Place 0.4 mg into the nose once.      ondansetron (ZOFRAN-ODT) 4 MG disintegrating tablet Take 1 tablet (4 mg total) by mouth every 8 (eight) hours as needed for nausea or vomiting. 10 tablet 0   polyethylene glycol (MIRALAX / GLYCOLAX) 17 g packet Take 17 g by mouth daily as needed for mild constipation or moderate constipation.     pramipexole (MIRAPEX) 0.5 MG tablet TAKE 1 TABLET BY MOUTH EVERYDAY AT BEDTIME 90 tablet 1   simvastatin (ZOCOR) 20 MG tablet TAKE 1 TABLET BY MOUTH EVERY DAY 90 tablet 1   tirzepatide (MOUNJARO) 12.5 MG/0.5ML Pen Inject 12.5 mg into the skin once a week. 6 mL 0   traMADol (ULTRAM) 50 MG tablet Take 50-100 mg by mouth every 8 (eight) hours as needed (Pain).      No current facility-administered medications on file prior to visit.     Allergies: Allergies  Allergen Reactions   Penicillins Anaphylaxis    11/28/20 - patient tolerated Keflex w/o adverse effects    Tape Rash    Plastic tape, silicone tape, Steri Strips   Bactrim [Sulfamethoxazole-Trimethoprim] Nausea Only   Lactose Nausea And Vomiting and Swelling        Paxlovid [Nirmatrelvir-Ritonavir] Nausea And Vomiting   Chlorhexidine Rash and Other (See Comments)    Dermatitis - Allergic    Family History: Migraine or other headaches in the family:  mom, sister Aneurysms in a first degree relative:  no Brain tumors in the family:  no Other neurological illness in the family:   no  Past Medical History: Past Medical History:  Diagnosis Date   Anemia    Anxiety    Arthritis    "back and knees" (07/23/2016)   Chiari malformation type I ([place]) dx'd 2014   Chronic back pain     buldging, DDD; "neck and lower back" (07/23/2016)   Chronic constipation    takes Miralax daily   Chronic pain syndrome    takes Methadone and Keppra daily   Depression    takes Effexor daily   Endometriosis    1995   Fibromyalgia    GERD (gastroesophageal reflux disease)    not taking anything at the present time   Headache  History of DVT of lower extremity 07/2012   LEFT LEG --  3/ 2014.Was on a blood transfusion for 6 months   History of kidney stones    History of MRSA infection 10 yrs ago   Hyperlipidemia    takes Simvastatin daily   Migraine    "daily recently" (07/23/2016)   Muscle spasm    takes Zanaflex nightly   Peripheral edema    takes Lasix daily as needed   Peripheral neuropathy    Pleurisy 2000   Pneumonia    hx of-7-8 yrs ago   PONV (postoperative nausea and vomiting)    Seasonal allergies    takes Claritin daily   Spinal headache    "since 2002; still get them; try to manage it w/lying flat"    Past Surgical History Past Surgical History:  Procedure Laterality Date   ANTERIOR CERVICAL DECOMP/DISCECTOMY FUSION  07/25/2009   C5 -- C6   APPENDECTOMY  age 72   APPLICATION OF CRANIAL NAVIGATION N/A 06/23/2017   Procedure: APPLICATION OF CRANIAL NAVIGATION;  Surgeon: Kary Kos, MD;  Location: [place];  Service: Neurosurgery;  Laterality: N/A;   BACK SURGERY     BRAIN SURGERY     VP shunt   BREAST REDUCTION SURGERY Bilateral 06/07/2013   Procedure: BILATERAL MAMMARY REDUCTION  (BREAST);  Surgeon: Theodoro Kos, DO;  Location: [place];  Service: Plastics;  Laterality: Bilateral;   BREAST REDUCTION SURGERY Bilateral 06/07/2013   Procedure:  LIPOSUCTION;  Surgeon: Theodoro Kos, DO;  Location: [place];  Service: Plastics;  Laterality: Bilateral;   CARPAL TUNNEL RELEASE Bilateral 2001   COLONOSCOPY  X 2   "polyps were benign"   CYSTOSCOPY WITH RETROGRADE PYELOGRAM, URETEROSCOPY AND STENT PLACEMENT Left 05/2021   (in my  chart)   DILATION AND CURETTAGE OF UTERUS     ESOPHAGOGASTRODUODENOSCOPY     gastric banding undone  11/2014   LAPAROSCOPIC CHOLECYSTECTOMY  ~ 2000   LAPAROSCOPIC GASTRIC BANDING  2011   LAPAROSCOPIC REVISION VENTRICULAR-PERITONEAL (V-P) SHUNT N/A 06/23/2017   Procedure: Shunt Placment stereotactic ventricular catheter placement and general surgery abdominal exposure  (Laparoscopic vs. Open) Brain Lab;  Surgeon: Clovis Riley, MD;  Location: [place];  Service: General;  Laterality: N/A;   LAPAROSCOPY N/A 06/23/2017   Procedure: LAPAROSCOPY DIAGNOSTIC;  Surgeon: Clovis Riley, MD;  Location: [place];  Service: General;  Laterality: N/A;   LUMBAR LAMINECTOMY/DECOMPRESSION MICRODISCECTOMY N/A 07/23/2016   Procedure: Decompressive Lumbar Laminectomy Lumbar two-three  for Repair of  Cerberal Spinal Fluid  Leak;  Surgeon: Kary Kos, MD;  Location: [place];  Service: Neurosurgery;  Laterality: N/A;   LUMBAR LAMINECTOMY/DECOMPRESSION MICRODISCECTOMY Left 03/01/2019   Procedure: LEFT LUMBAR THREE-FOUR EXTRAFORAMINAL MICRODISCECTOMY;  Surgeon: Kary Kos, MD;  Location: [place];  Service: Neurosurgery;  Laterality: Left;   PLACEMENT LUMBOPERITONEAL SHUNT FOR CEREBROSPINAL FLUID DIVERSION AWAY FROM C8 PERINEURAL CYST  09-05-2003  &  12-24-2003   08-31-2003  REMOVAL SHUNT   POSTERIOR CERVICAL LAMINECTOMY  03/01/2001   C7-8 and Exploration C8 perineural cyst   POSTERIOR LUMBAR LAMINECTOMY L4-5/  DISKECTOMY L5-S1  05/20/2005   RE-DO DECOMPRESSION LAMINECTOMY AND FUSION L4-5  &  L5-S1  12/20/2006   02-04-2007--  RE-EXPLORATION L4 to S1, I & D LUMBAR WOUND HEMATOMA   REDUCTION MAMMAPLASTY     SHUNT REMOVAL N/A 07/10/2016   Procedure: SHUNT REMOVAL;  Surgeon: Kary Kos, MD;  Location: [place];  Service: Neurosurgery;  Laterality: N/A;  SHUNT REMOVAL  SPINAL CORD STIMULATOR INSERTION N/A 04/26/2015   Procedure: LUMBAR SPINAL CORD STIMULATOR INSERTION;  Surgeon: Clydell Hakim, MD;  Location: [place] [facility];   Service: Neurosurgery;  Laterality: N/A;  LUMBAR SPINAL CORD STIMULATOR INSERTION   SPINAL CORD STIMULATOR REMOVAL N/A 06/28/2019   Procedure: Removal of dorsal column stimulator and generator -;  Surgeon: Kary Kos, MD;  Location: [place];  Service: Neurosurgery;  Laterality: N/A;   TENNIS ELBOW RELEASE/NIRSCHEL PROCEDURE Left 12/13/2014   Procedure: LEFT ELBOW LATERAL EPICONDYLAR DEBRIDEMENT AND OR REPAIR -RECONSTRUCTION ;  Surgeon: Iran Planas, MD;  Location: [place];  Service: Orthopedics;  Laterality: Left;   TOTAL ABDOMINAL HYSTERECTOMY  1994   w/  Bilateral Salpinoophorectomy   VENTRICULOPERITONEAL SHUNT N/A 06/23/2017   Procedure: VENTRICULAR-PERITONEAL SHUNT INSERTION WITH ABDOMINAL LAPARASCOPY AND BRAINLAB NAVIGATION;  Surgeon: Kary Kos, MD;  Location: [place];  Service: Neurosurgery;  Laterality: N/A;    Social History: Social History   Tobacco Use   Smoking status: Never   Smokeless tobacco: Never  Vaping Use   Vaping Use: Never used  Substance Use Topics   Alcohol use: Yes    Comment: once a year   Drug use: No    ROS: Negative for fevers, chills. Positive for headaches. All other systems reviewed and negative unless stated otherwise in HPI.   Physical Exam:   Vital Signs: BP 133/86   Pulse 88   Ht '5\' 3"'  (1.6 m)   Wt 172 lb 12.8 oz (78.4 kg)   BMI 30.61 kg/m  GENERAL: well appearing,in no acute distress,alert SKIN:  Color, texture, turgor normal. No rashes or lesions HEAD:  Normocephalic/atraumatic. CV:  RRR RESP: Normal respiratory effort MSK: +tenderness to palpation over R>L occiput, neck, and shoulders  NEUROLOGICAL: Mental Status: Alert, oriented to person, place and time,Follows commands Cranial Nerves: PERRL, visual fields intact to confrontation, extraocular movements intact, facial sensation intact, no facial droop, hearing grossly intact, no dysarthria Motor: muscle strength 5/5 both upper and lower extremities Reflexes: 2+  throughout Sensation: intact to light touch all 4 extremities Coordination: Finger-to- nose-finger intact bilaterally Gait: normal-based   IMPRESSION: 56 year old female with a history of GERD, Chiari 1 malformation, 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  Her current headache pattern is most consistent with migraine with aura. She has failed multiple oral preventive medications previously. Will start Bibo for migraine prevention and Maxalt for rescue. Offered alternate muscle relaxer as she has not noticed improvement with Robaxin. She would prefer to follow up with her pain clinic regarding other options.   PLAN: -Prevention: Start Aimovig 70 mg monthly -Rescue: Start Maxalt 10 mg PRN -Next steps: consider Botox, propranolol   I spent a total of 30 minutes chart reviewing and counseling the patient. Headache education was done. Discussed treatment options including preventive and acute medications, and physical therapy.  Discussed medication side effects, adverse reactions and drug interactions. Written educational materials and patient instructions outlining all of the above were given.  Follow-up: 6 months   Genia Harold, MD 11/21/2021   10:42 AM

## 2021-11-21 ENCOUNTER — Ambulatory Visit: Payer: Medicare HMO | Admitting: Psychiatry

## 2021-11-21 ENCOUNTER — Telehealth: Payer: Self-pay | Admitting: *Deleted

## 2021-11-21 ENCOUNTER — Encounter: Payer: Self-pay | Admitting: Psychiatry

## 2021-11-21 VITALS — BP 133/86 | HR 88 | Ht 63.0 in | Wt 172.8 lb

## 2021-11-21 DIAGNOSIS — G935 Compression of brain: Secondary | ICD-10-CM

## 2021-11-21 DIAGNOSIS — G43119 Migraine with aura, intractable, without status migrainosus: Secondary | ICD-10-CM

## 2021-11-21 MED ORDER — RIZATRIPTAN BENZOATE 10 MG PO TABS: 10.0000 mg | ORAL_TABLET | ORAL | 6 refills | Status: DC | PRN

## 2021-11-21 MED ORDER — AIMOVIG 70 MG/ML ~~LOC~~ SOAJ: 70.0000 mg | SUBCUTANEOUS | 6 refills | Status: DC

## 2021-11-21 NOTE — Telephone Encounter (Signed)
Aimovig 05/18/2021 - 02/19/2022. Faxed approval letter to pharmacy.

## 2021-11-21 NOTE — Telephone Encounter (Signed)
Aimovig PA, Key: [id number]. Your information has been submitted to [place] [employer]. [employer] will review the request and will issue a decision, typically within 1-3 days from your submission.  If [employer] has not responded in 1-3 days or if you have any questions about your ePA request, please contact [place] [employer] at [phone].

## 2021-11-21 NOTE — Patient Instructions (Addendum)
Start Aimovig 70 mg monthly for headache prevention Start rizatriptan as needed for migraines. Take at the onset of migraine. If headache recurs or does not fully resolve, you may take a second dose after 2 hours. Please avoid taking more than 2 days per week to avoid rebound headaches

## 2021-12-18 DIAGNOSIS — M5417 Radiculopathy, lumbosacral region: Secondary | ICD-10-CM | POA: Diagnosis not present

## 2021-12-18 DIAGNOSIS — M961 Postlaminectomy syndrome, not elsewhere classified: Secondary | ICD-10-CM | POA: Diagnosis not present

## 2021-12-18 DIAGNOSIS — M5440 Lumbago with sciatica, unspecified side: Secondary | ICD-10-CM | POA: Diagnosis not present

## 2021-12-18 DIAGNOSIS — G8929 Other chronic pain: Secondary | ICD-10-CM | POA: Diagnosis not present

## 2021-12-18 DIAGNOSIS — M542 Cervicalgia: Secondary | ICD-10-CM | POA: Diagnosis not present

## 2022-01-22 ENCOUNTER — Other Ambulatory Visit: Payer: Self-pay | Admitting: Family Medicine

## 2022-02-05 ENCOUNTER — Ambulatory Visit (INDEPENDENT_AMBULATORY_CARE_PROVIDER_SITE_OTHER): Payer: Medicare HMO | Admitting: Family Medicine

## 2022-02-05 ENCOUNTER — Encounter: Payer: Self-pay | Admitting: Family Medicine

## 2022-02-05 VITALS — BP 110/73 | HR 82 | Ht 63.0 in | Wt 164.0 lb

## 2022-02-05 DIAGNOSIS — G932 Benign intracranial hypertension: Secondary | ICD-10-CM

## 2022-02-05 DIAGNOSIS — M5481 Occipital neuralgia: Secondary | ICD-10-CM | POA: Diagnosis not present

## 2022-02-05 DIAGNOSIS — R7303 Prediabetes: Secondary | ICD-10-CM | POA: Diagnosis not present

## 2022-02-05 DIAGNOSIS — R42 Dizziness and giddiness: Secondary | ICD-10-CM

## 2022-02-05 DIAGNOSIS — M961 Postlaminectomy syndrome, not elsewhere classified: Secondary | ICD-10-CM

## 2022-02-05 DIAGNOSIS — K5903 Drug induced constipation: Secondary | ICD-10-CM | POA: Diagnosis not present

## 2022-02-05 DIAGNOSIS — Z23 Encounter for immunization: Secondary | ICD-10-CM

## 2022-02-05 DIAGNOSIS — T402X5A Adverse effect of other opioids, initial encounter: Secondary | ICD-10-CM

## 2022-02-05 LAB — POCT GLYCOSYLATED HEMOGLOBIN (HGB A1C): Hemoglobin A1C: 5.1 % (ref 4.0–5.6)

## 2022-02-05 MED ORDER — NALOXEGOL OXALATE 25 MG PO TABS: 25.0000 mg | ORAL_TABLET | Freq: Every day | ORAL | 3 refills | Status: DC

## 2022-02-05 MED ORDER — NURTEC 75 MG PO TBDP: ORAL_TABLET | ORAL | 3 refills | Status: DC

## 2022-02-05 NOTE — Assessment & Plan Note (Signed)
Intermittent, worsened with postural change.  Recommend staying well hydrated.  Monitor BP at home to be sure not getting too low

## 2022-02-05 NOTE — Patient Instructions (Addendum)
Let's try nurtec every other day for migraine prevention.  New neurology referral has been placed.   I have sent over movantik for constipation.  If the '25mg'$  is too much, reduce to 1/2 tab

## 2022-02-05 NOTE — Assessment & Plan Note (Addendum)
Worsened with addition of GLP-1.  Has tried fiber, stool softeners, miralax.   Adding Movantik to see if this provides some relief.

## 2022-02-05 NOTE — Progress Notes (Signed)
Katrina Fernandez - 55 y.o. female MRN [id number]  Date of birth: 1965-07-24  Subjective Chief Complaint  Patient presents with   Headache   Depression   Dizziness    HPI Katrina Fernandez is a 56 y.o. female here today for follow up visit.    She has history of prediabetes and is currently on mounjaro.  She has done well with this.  Blood sugars are much better controlled and weight has decreased.  She is having some constipation with this.   She is also seeing neurology for migraines.  Prescribed aimovig but didn't start due to constipation concern.  She does continue to have some dizziness.  Hx of VP shunt due to chiari malformation and pseudotumor.    Followed by pain management and taking chronic methadone.  As above, constipation continues to be an issue.    ROS:  A comprehensive ROS was completed and negative except as noted per HPI  Allergies  Allergen Reactions   Penicillins Anaphylaxis    11/28/20 - patient tolerated Keflex w/o adverse effects    Tape Rash    Plastic tape, silicone tape, Steri Strips   Bactrim [Sulfamethoxazole-Trimethoprim] Nausea Only   Lactose Nausea And Vomiting and Swelling        Paxlovid [Nirmatrelvir-Ritonavir] Nausea And Vomiting   Chlorhexidine Rash and Other (See Comments)    Dermatitis - Allergic    Past Medical History:  Diagnosis Date   Anemia    Anxiety    Arthritis    "back and knees" (07/23/2016)   Chiari malformation type I ([place]) dx'd 2014   Chronic back pain    buldging, DDD; "neck and lower back" (07/23/2016)   Chronic constipation    takes Miralax daily   Chronic pain syndrome    takes Methadone and Keppra daily   Depression    takes Effexor daily   Endometriosis    1995   Fibromyalgia    GERD (gastroesophageal reflux disease)    not taking anything at the present time   Headache    History of DVT of lower extremity 07/2012   LEFT LEG --  3/ 2014.Was on a blood transfusion for 6 months   History of kidney stones     History of MRSA infection 10 yrs ago   Hyperlipidemia    takes Simvastatin daily   Migraine    "daily recently" (07/23/2016)   Muscle spasm    takes Zanaflex nightly   Peripheral edema    takes Lasix daily as needed   Peripheral neuropathy    Pleurisy 2000   Pneumonia    hx of-7-8 yrs ago   PONV (postoperative nausea and vomiting)    Seasonal allergies    takes Claritin daily   Spinal headache    "since 2002; still get them; try to manage it w/lying flat"    Past Surgical History:  Procedure Laterality Date   ANTERIOR CERVICAL DECOMP/DISCECTOMY FUSION  07/25/2009   C5 -- C6   APPENDECTOMY  age 3   APPLICATION OF CRANIAL NAVIGATION N/A 06/23/2017   Procedure: APPLICATION OF CRANIAL NAVIGATION;  Surgeon: Kary Kos, MD;  Location: [place];  Service: Neurosurgery;  Laterality: N/A;   BACK SURGERY     BRAIN SURGERY     VP shunt   BREAST REDUCTION SURGERY Bilateral 06/07/2013   Procedure: BILATERAL MAMMARY REDUCTION  (BREAST);  Surgeon: Theodoro Kos, DO;  Location: [place];  Service: Plastics;  Laterality: Bilateral;   BREAST REDUCTION SURGERY Bilateral  06/07/2013   Procedure:  LIPOSUCTION;  Surgeon: Theodoro Kos, DO;  Location: [place];  Service: Plastics;  Laterality: Bilateral;   CARPAL TUNNEL RELEASE Bilateral 2001   COLONOSCOPY  X 2   "polyps were benign"   CYSTOSCOPY WITH RETROGRADE PYELOGRAM, URETEROSCOPY AND STENT PLACEMENT Left 05/2021   (in my chart)   DILATION AND CURETTAGE OF UTERUS     ESOPHAGOGASTRODUODENOSCOPY     gastric banding undone  11/2014   LAPAROSCOPIC CHOLECYSTECTOMY  ~ 2000   LAPAROSCOPIC GASTRIC BANDING  2011   LAPAROSCOPIC REVISION VENTRICULAR-PERITONEAL (V-P) SHUNT N/A 06/23/2017   Procedure: Shunt Placment stereotactic ventricular catheter placement and general surgery abdominal exposure  (Laparoscopic vs. Open) Brain Lab;  Surgeon: Clovis Riley, MD;  Location: [place];  Service: General;  Laterality: N/A;    LAPAROSCOPY N/A 06/23/2017   Procedure: LAPAROSCOPY DIAGNOSTIC;  Surgeon: Clovis Riley, MD;  Location: [place];  Service: General;  Laterality: N/A;   LUMBAR LAMINECTOMY/DECOMPRESSION MICRODISCECTOMY N/A 07/23/2016   Procedure: Decompressive Lumbar Laminectomy Lumbar two-three  for Repair of  Cerberal Spinal Fluid  Leak;  Surgeon: Kary Kos, MD;  Location: [place];  Service: Neurosurgery;  Laterality: N/A;   LUMBAR LAMINECTOMY/DECOMPRESSION MICRODISCECTOMY Left 03/01/2019   Procedure: LEFT LUMBAR THREE-FOUR EXTRAFORAMINAL MICRODISCECTOMY;  Surgeon: Kary Kos, MD;  Location: [place];  Service: Neurosurgery;  Laterality: Left;   PLACEMENT LUMBOPERITONEAL SHUNT FOR CEREBROSPINAL FLUID DIVERSION AWAY FROM C8 PERINEURAL CYST  09-05-2003  &  12-24-2003   08-31-2003  REMOVAL SHUNT   POSTERIOR CERVICAL LAMINECTOMY  03/01/2001   C7-8 and Exploration C8 perineural cyst   POSTERIOR LUMBAR LAMINECTOMY L4-5/  DISKECTOMY L5-S1  05/20/2005   RE-DO DECOMPRESSION LAMINECTOMY AND FUSION L4-5  &  L5-S1  12/20/2006   02-04-2007--  RE-EXPLORATION L4 to S1, I & D LUMBAR WOUND HEMATOMA   REDUCTION MAMMAPLASTY     SHUNT REMOVAL N/A 07/10/2016   Procedure: SHUNT REMOVAL;  Surgeon: Kary Kos, MD;  Location: [facility];  Service: Neurosurgery;  Laterality: N/A;  SHUNT REMOVAL   SPINAL CORD STIMULATOR INSERTION N/A 04/26/2015   Procedure: LUMBAR SPINAL CORD STIMULATOR INSERTION;  Surgeon: Clydell Hakim, MD;  Location: [facility];  Service: Neurosurgery;  Laterality: N/A;  LUMBAR SPINAL CORD STIMULATOR INSERTION   SPINAL CORD STIMULATOR REMOVAL N/A 06/28/2019   Procedure: Removal of dorsal column stimulator and generator -;  Surgeon: Kary Kos, MD;  Location: [place];  Service: Neurosurgery;  Laterality: N/A;   TENNIS ELBOW RELEASE/NIRSCHEL PROCEDURE Left 12/13/2014   Procedure: LEFT ELBOW LATERAL EPICONDYLAR DEBRIDEMENT AND OR REPAIR -RECONSTRUCTION ;  Surgeon: Iran Planas, MD;  Location: [place];   Service: Orthopedics;  Laterality: Left;   TOTAL ABDOMINAL HYSTERECTOMY  1994   w/  Bilateral Salpinoophorectomy   VENTRICULOPERITONEAL SHUNT N/A 06/23/2017   Procedure: VENTRICULAR-PERITONEAL SHUNT INSERTION WITH ABDOMINAL LAPARASCOPY AND BRAINLAB NAVIGATION;  Surgeon: Kary Kos, MD;  Location: [place];  Service: Neurosurgery;  Laterality: N/A;    Social History   Socioeconomic History   Marital status: Divorced    Spouse name: Not on file   Number of children: 2   Years of education: 14   Highest education level: Associate degree: academic program  Occupational History   Occupation: Disabled   Tobacco Use   Smoking status: Never   Smokeless tobacco: Never  Vaping Use   Vaping Use: Never used  Substance and Sexual Activity   Alcohol use: Yes    Comment: once a year   Drug  use: No   Sexual activity: Not Currently    Birth control/protection: Surgical  Other Topics Concern   Not on file  Social History Narrative   Mrs Bence is divorced, on permanent disability and lives at home with her mother and her daughter. She is independent in her care needs. She denies concern with transportation to medical appointments She is on a fixed income.   Caffiene products- one hot tea in am.   Education: [employer]   Not working.    Social Determinants of Health   Financial Resource Strain: Low Risk  (07/28/2021)   Overall Financial Resource Strain (CARDIA)    Difficulty of Paying Living Expenses: Not hard at all  Food Insecurity: No Food Insecurity (07/28/2021)   Hunger Vital Sign    Worried About Running Out of Food in the Last Year: Never true    Ran Out of Food in the Last Year: Never true  Transportation Needs: No Transportation Needs (07/28/2021)   PRAPARE - Hydrologist (Medical): No    Lack of Transportation (Non-Medical): No  Physical Activity: Inactive (07/28/2021)   Exercise Vital Sign    Days of Exercise per Week: 0 days    Minutes of  Exercise per Session: 0 min  Stress: No Stress Concern Present (07/28/2021)   [place]    Feeling of Stress : Not at all  Social Connections: Socially Isolated (07/28/2021)   Social Connection and Isolation Panel [NHANES]    Frequency of Communication with Friends and Family: More than three times a week    Frequency of Social Gatherings with Friends and Family: More than three times a week    Attends Religious Services: Never    Marine scientist or Organizations: No    Attends Music therapist: Never    Marital Status: Divorced    Family History  Problem Relation Age of Onset   COPD Mother    Diabetes Mother    Asthma Mother    Diabetes Father    Colon cancer Father    Ovarian cancer Maternal Grandmother    Pancreatic cancer Maternal Grandfather    Depression Other     Health Maintenance  Topic Date Due   COVID-19 Vaccine (3 - Moderna risk series) 06/18/2022 (Originally 12/05/2019)   TETANUS/TDAP  07/29/2022 (Originally 05/19/2019)   Hepatitis C Screening  07/29/2022 (Originally 08/23/1985)   Zoster Vaccines- Shingrix (2 of 2) 04/02/2022   COLONOSCOPY (Pts 45-63yr Insurance coverage will need to be confirmed)  07/05/2023   MAMMOGRAM  08/29/2023   INFLUENZA VACCINE  Completed   HIV Screening  Completed   HPV VACCINES  Aged Out     ----------------------------------------------------------------------------------------------------------------------------------------------------------------------------------------------------------------- Physical Exam BP 110/73 (BP Location: Left Arm, Patient Position: Sitting, Cuff Size: Normal)   Pulse 82   Ht '5\' 3"'$  (1.6 m)   Wt 164 lb (74.4 kg)   SpO2 98%   BMI 29.05 kg/m   Physical Exam Constitutional:      Appearance: She is well-developed.  Cardiovascular:     Rate and Rhythm: Normal rate and regular rhythm.  Pulmonary:     Effort: Pulmonary  effort is normal.     Breath sounds: Normal breath sounds.  Neurological:     Mental Status: She is alert.  Psychiatric:        Mood and Affect: Mood normal.        Behavior: Behavior normal.     -------------------------------------------------------------------------------------------------------------------------------------------------------------------------------------------------------------------  Assessment and Plan  Prediabetes Continues to do well with mounjaro.  Some additional constipation.  Fluids and fiber reinforced.  Continue to work on dietary changes.    Therapeutic opioid-induced constipation (OIC) Worsened with addition of GLP-1.  Has tried fiber, stool softeners, miralax.   Adding Movantik to see if this provides some relief.    Postlaminectomy syndrome of lumbar region Followed by pain management.  Remains on chronic methadone.   Dizziness Intermittent, worsened with postural change.  Recommend staying well hydrated.  Monitor BP at home to be sure not getting too low  Occipital neuralgia of right side She did not want to take aimovig at this time.  Will try nurtec every other day for prophylactic dosing.  Provided with samples today.     Meds ordered this encounter  Medications   Rimegepant Sulfate (NURTEC) 75 MG TBDP    Sig: Take '75mg'$  every other day for migraine prevention    Dispense:  16 tablet    Refill:  3   naloxegol oxalate (MOVANTIK) 25 MG TABS tablet    Sig: Take 1 tablet (25 mg total) by mouth daily.    Dispense:  30 tablet    Refill:  3    No follow-ups on file.    This visit occurred during the SARS-CoV-2 public health emergency.  Safety protocols were in place, including screening questions prior to the visit, additional usage of staff PPE, and extensive cleaning of exam room while observing appropriate contact time as indicated for disinfecting solutions.

## 2022-02-05 NOTE — Assessment & Plan Note (Signed)
Followed by pain management.  Remains on chronic methadone.

## 2022-02-05 NOTE — Assessment & Plan Note (Signed)
She did not want to take aimovig at this time.  Will try nurtec every other day for prophylactic dosing.  Provided with samples today.

## 2022-02-05 NOTE — Assessment & Plan Note (Signed)
Continues to do well with mounjaro.  Some additional constipation.  Fluids and fiber reinforced.  Continue to work on dietary changes.

## 2022-02-07 ENCOUNTER — Telehealth: Payer: Self-pay

## 2022-02-07 NOTE — Telephone Encounter (Signed)
PA initiated for Nurtec.  Your request has been approved Nurtec '75MG'$  dispersible tablets.

## 2022-02-12 ENCOUNTER — Other Ambulatory Visit: Payer: Self-pay | Admitting: Family Medicine

## 2022-02-12 DIAGNOSIS — G8929 Other chronic pain: Secondary | ICD-10-CM | POA: Diagnosis not present

## 2022-02-12 DIAGNOSIS — M5440 Lumbago with sciatica, unspecified side: Secondary | ICD-10-CM | POA: Diagnosis not present

## 2022-02-12 DIAGNOSIS — M542 Cervicalgia: Secondary | ICD-10-CM | POA: Diagnosis not present

## 2022-02-12 DIAGNOSIS — M961 Postlaminectomy syndrome, not elsewhere classified: Secondary | ICD-10-CM | POA: Diagnosis not present

## 2022-02-12 DIAGNOSIS — M7061 Trochanteric bursitis, right hip: Secondary | ICD-10-CM | POA: Diagnosis not present

## 2022-02-17 DIAGNOSIS — G959 Disease of spinal cord, unspecified: Secondary | ICD-10-CM | POA: Diagnosis not present

## 2022-02-17 DIAGNOSIS — R519 Headache, unspecified: Secondary | ICD-10-CM | POA: Diagnosis not present

## 2022-02-17 DIAGNOSIS — G935 Compression of brain: Secondary | ICD-10-CM | POA: Diagnosis not present

## 2022-02-17 DIAGNOSIS — Z982 Presence of cerebrospinal fluid drainage device: Secondary | ICD-10-CM | POA: Diagnosis not present

## 2022-02-23 ENCOUNTER — Telehealth: Payer: Self-pay | Admitting: *Deleted

## 2022-02-23 NOTE — Telephone Encounter (Signed)
Aimovig PA< Key: [id number]. Your information has been submitted to [place] [employer] Part D. [employer] will review the request and will issue a decision, typically within 1-3 days from your submission.  If [employer] has not responded in 1-3 days or if you have any questions about your ePA request, please contact [place] Medicare Part D at [phone].

## 2022-02-23 NOTE — Telephone Encounter (Addendum)
AImovig- This approval authorizes your coverage from 05/18/2021 - 05/17/2022. Noted patient saw [facility] on 02/17/22, documented she never started Aimovig, was afraid of possible allergic reactions.

## 2022-03-11 ENCOUNTER — Other Ambulatory Visit: Payer: Self-pay | Admitting: Family Medicine

## 2022-03-19 ENCOUNTER — Other Ambulatory Visit: Payer: Self-pay | Admitting: Family Medicine

## 2022-03-27 ENCOUNTER — Other Ambulatory Visit: Payer: Self-pay | Admitting: Family Medicine

## 2022-04-16 DIAGNOSIS — M961 Postlaminectomy syndrome, not elsewhere classified: Secondary | ICD-10-CM | POA: Diagnosis not present

## 2022-04-16 DIAGNOSIS — M542 Cervicalgia: Secondary | ICD-10-CM | POA: Diagnosis not present

## 2022-04-19 ENCOUNTER — Other Ambulatory Visit: Payer: Self-pay | Admitting: Family Medicine

## 2022-04-23 ENCOUNTER — Ambulatory Visit (INDEPENDENT_AMBULATORY_CARE_PROVIDER_SITE_OTHER): Payer: Medicare HMO | Admitting: Family Medicine

## 2022-04-23 ENCOUNTER — Ambulatory Visit: Payer: Medicare HMO | Attending: Family Medicine

## 2022-04-23 ENCOUNTER — Other Ambulatory Visit: Payer: Self-pay | Admitting: Family Medicine

## 2022-04-23 ENCOUNTER — Encounter: Payer: Self-pay | Admitting: Family Medicine

## 2022-04-23 ENCOUNTER — Ambulatory Visit (INDEPENDENT_AMBULATORY_CARE_PROVIDER_SITE_OTHER): Payer: Medicare HMO

## 2022-04-23 VITALS — BP 113/74 | HR 92 | Ht 63.0 in | Wt 158.0 lb

## 2022-04-23 DIAGNOSIS — R0600 Dyspnea, unspecified: Secondary | ICD-10-CM | POA: Diagnosis not present

## 2022-04-23 DIAGNOSIS — R0989 Other specified symptoms and signs involving the circulatory and respiratory systems: Secondary | ICD-10-CM | POA: Diagnosis not present

## 2022-04-23 DIAGNOSIS — R7303 Prediabetes: Secondary | ICD-10-CM | POA: Diagnosis not present

## 2022-04-23 DIAGNOSIS — R071 Chest pain on breathing: Secondary | ICD-10-CM | POA: Insufficient documentation

## 2022-04-23 DIAGNOSIS — G2581 Restless legs syndrome: Secondary | ICD-10-CM | POA: Diagnosis not present

## 2022-04-23 DIAGNOSIS — R5383 Other fatigue: Secondary | ICD-10-CM

## 2022-04-23 DIAGNOSIS — R42 Dizziness and giddiness: Secondary | ICD-10-CM

## 2022-04-23 DIAGNOSIS — R0789 Other chest pain: Secondary | ICD-10-CM

## 2022-04-23 DIAGNOSIS — R079 Chest pain, unspecified: Secondary | ICD-10-CM | POA: Diagnosis not present

## 2022-04-23 NOTE — Progress Notes (Unsigned)
Enrolled for [employer] to mail a ZIO XT long term holter monitor to the patients address on file.  

## 2022-04-23 NOTE — Assessment & Plan Note (Signed)
Chest pain seems atypical for cardiac chest pain.  She did have recent respiratory symptoms.  Chest x-ray ordered.  Her heart monitor on her watch possibly indicating atrial fibrillation.  Zio patch ordered.  Updated labs ordered today.

## 2022-04-23 NOTE — Progress Notes (Signed)
Katrina Fernandez - 56 y.o. female MRN [id number]  Date of birth: [birth year]  Subjective Chief Complaint  Patient presents with   Chest Pain    HPI Katrina Fernandez is a 56 year old female here today with complaint of chest pain.  She had episode of chest pain a few days ago.  Pain is located in the left chest and did have some radiation into the left arm.  She did have mild dyspnea with this.  She has not had a cough recently and reports that symptoms feel somewhat similar to episode of pleurisy she has had in the past.  Chest pain lasted about 2 to 3 days before starting to improve.  She denies fever, chills, dizziness or palpitations.  She does report that her smart watch suggest that she may be in A-fib few days ago as well.  ROS:  A comprehensive ROS was completed and negative except as noted per HPI  Allergies  Allergen Reactions   Penicillins Anaphylaxis    11/28/20 - patient tolerated Keflex w/o adverse effects    Tape Rash    Plastic tape, silicone tape, Steri Strips   Bactrim [Sulfamethoxazole-Trimethoprim] Nausea Only   Paxlovid [Nirmatrelvir-Ritonavir] Nausea And Vomiting   Chlorhexidine Rash and Other (See Comments)    Dermatitis - Allergic    Past Medical History:  Diagnosis Date   Anemia    Anxiety    Arthritis    "back and knees" (07/23/2016)   Chiari malformation type I ([place]) dx'd 2014   Chronic back pain    buldging, DDD; "neck and lower back" (07/23/2016)   Chronic constipation    takes Miralax daily   Chronic pain syndrome    takes Methadone and Keppra daily   Depression    takes Effexor daily   Endometriosis    1995   Fibromyalgia    GERD (gastroesophageal reflux disease)    not taking anything at the present time   Headache    History of DVT of lower extremity 07/2012   LEFT LEG --  3/ 2014.Was on a blood transfusion for 6 months   History of kidney stones    History of MRSA infection 10 yrs ago   Hyperlipidemia    takes Simvastatin daily   Migraine     "daily recently" (07/23/2016)   Muscle spasm    takes Zanaflex nightly   Peripheral edema    takes Lasix daily as needed   Peripheral neuropathy    Pleurisy 2000   Pneumonia    hx of-7-8 yrs ago   PONV (postoperative nausea and vomiting)    Seasonal allergies    takes Claritin daily   Spinal headache    "since 2002; still get them; try to manage it w/lying flat"    Past Surgical History:  Procedure Laterality Date   ANTERIOR CERVICAL DECOMP/DISCECTOMY FUSION  07/25/2009   C5 -- C6   APPENDECTOMY  age 66   APPLICATION OF CRANIAL NAVIGATION N/A 06/23/2017   Procedure: APPLICATION OF CRANIAL NAVIGATION;  Surgeon: Kary Kos, MD;  Location: [place];  Service: Neurosurgery;  Laterality: N/A;   BACK SURGERY     BRAIN SURGERY     VP shunt   BREAST REDUCTION SURGERY Bilateral 06/07/2013   Procedure: BILATERAL MAMMARY REDUCTION  (BREAST);  Surgeon: Theodoro Kos, DO;  Location: [place];  Service: Plastics;  Laterality: Bilateral;   BREAST REDUCTION SURGERY Bilateral 06/07/2013   Procedure:  LIPOSUCTION;  Surgeon: Theodoro Kos, DO;  Location: [place];  Service: Clinical cytogeneticist;  Laterality: Bilateral;   CARPAL TUNNEL RELEASE Bilateral 2001   COLONOSCOPY  X 2   "polyps were benign"   CYSTOSCOPY WITH RETROGRADE PYELOGRAM, URETEROSCOPY AND STENT PLACEMENT Left 05/2021   (in my chart)   DILATION AND CURETTAGE OF UTERUS     ESOPHAGOGASTRODUODENOSCOPY     gastric banding undone  11/2014   LAPAROSCOPIC CHOLECYSTECTOMY  ~ 2000   LAPAROSCOPIC GASTRIC BANDING  2011   LAPAROSCOPIC REVISION VENTRICULAR-PERITONEAL (V-P) SHUNT N/A 06/23/2017   Procedure: Shunt Placment stereotactic ventricular catheter placement and general surgery abdominal exposure  (Laparoscopic vs. Open) Brain Lab;  Surgeon: Clovis Riley, MD;  Location: [place];  Service: General;  Laterality: N/A;   LAPAROSCOPY N/A 06/23/2017   Procedure: LAPAROSCOPY DIAGNOSTIC;  Surgeon: Clovis Riley, MD;   Location: [place];  Service: General;  Laterality: N/A;   LUMBAR LAMINECTOMY/DECOMPRESSION MICRODISCECTOMY N/A 07/23/2016   Procedure: Decompressive Lumbar Laminectomy Lumbar two-three  for Repair of  Cerberal Spinal Fluid  Leak;  Surgeon: Kary Kos, MD;  Location: [place];  Service: Neurosurgery;  Laterality: N/A;   LUMBAR LAMINECTOMY/DECOMPRESSION MICRODISCECTOMY Left 03/01/2019   Procedure: LEFT LUMBAR THREE-FOUR EXTRAFORAMINAL MICRODISCECTOMY;  Surgeon: Kary Kos, MD;  Location: [place];  Service: Neurosurgery;  Laterality: Left;   PLACEMENT LUMBOPERITONEAL SHUNT FOR CEREBROSPINAL FLUID DIVERSION AWAY FROM C8 PERINEURAL CYST  09-05-2003  &  12-24-2003   08-31-2003  REMOVAL SHUNT   POSTERIOR CERVICAL LAMINECTOMY  03/01/2001   C7-8 and Exploration C8 perineural cyst   POSTERIOR LUMBAR LAMINECTOMY L4-5/  DISKECTOMY L5-S1  05/20/2005   RE-DO DECOMPRESSION LAMINECTOMY AND FUSION L4-5  &  L5-S1  12/20/2006   02-04-2007--  RE-EXPLORATION L4 to S1, I & D LUMBAR WOUND HEMATOMA   REDUCTION MAMMAPLASTY     SHUNT REMOVAL N/A 07/10/2016   Procedure: SHUNT REMOVAL;  Surgeon: Kary Kos, MD;  Location: [facility];  Service: Neurosurgery;  Laterality: N/A;  SHUNT REMOVAL   SPINAL CORD STIMULATOR INSERTION N/A 04/26/2015   Procedure: LUMBAR SPINAL CORD STIMULATOR INSERTION;  Surgeon: Clydell Hakim, MD;  Location: [facility];  Service: Neurosurgery;  Laterality: N/A;  LUMBAR SPINAL CORD STIMULATOR INSERTION   SPINAL CORD STIMULATOR REMOVAL N/A 06/28/2019   Procedure: Removal of dorsal column stimulator and generator -;  Surgeon: Kary Kos, MD;  Location: [place];  Service: Neurosurgery;  Laterality: N/A;   TENNIS ELBOW RELEASE/NIRSCHEL PROCEDURE Left 12/13/2014   Procedure: LEFT ELBOW LATERAL EPICONDYLAR DEBRIDEMENT AND OR REPAIR -RECONSTRUCTION ;  Surgeon: Iran Planas, MD;  Location: [place] [facility];  Service: Orthopedics;  Laterality: Left;   TOTAL ABDOMINAL HYSTERECTOMY  1994   w/  Bilateral  Salpinoophorectomy   VENTRICULOPERITONEAL SHUNT N/A 06/23/2017   Procedure: VENTRICULAR-PERITONEAL SHUNT INSERTION WITH ABDOMINAL LAPARASCOPY AND BRAINLAB NAVIGATION;  Surgeon: Kary Kos, MD;  Location: [facility];  Service: Neurosurgery;  Laterality: N/A;    Social History   Socioeconomic History   Marital status: Divorced    Spouse name: Not on file   Number of children: 2   Years of education: 14   Highest education level: Associate degree: academic program  Occupational History   Occupation: Disabled   Tobacco Use   Smoking status: Never   Smokeless tobacco: Never  Scientific laboratory technician Use: Never used  Substance and Sexual Activity   Alcohol use: Yes    Comment: once a year   Drug use: No   Sexual activity: Not Currently    Birth control/protection: Surgical  Other Topics Concern  Not on file  Social History Narrative   Katrina Fernandez is divorced, on permanent disability and lives at home with her mother and her daughter. She is independent in her care needs. She denies concern with transportation to medical appointments She is on a fixed income.   Caffiene products- one hot tea in am.   Education: [employer]   Not working.    Social Determinants of Health   Financial Resource Strain: Low Risk  (07/28/2021)   Overall Financial Resource Strain (CARDIA)    Difficulty of Paying Living Expenses: Not hard at all  Food Insecurity: No Food Insecurity (07/28/2021)   Hunger Vital Sign    Worried About Running Out of Food in the Last Year: Never true    Ran Out of Food in the Last Year: Never true  Transportation Needs: No Transportation Needs (07/28/2021)   PRAPARE - Hydrologist (Medical): No    Lack of Transportation (Non-Medical): No  Physical Activity: Inactive (07/28/2021)   Exercise Vital Sign    Days of Exercise per Week: 0 days    Minutes of Exercise per Session: 0 min  Stress: No Stress Concern Present (07/28/2021)   [place]    Feeling of Stress : Not at all  Social Connections: Socially Isolated (07/28/2021)   Social Connection and Isolation Panel [NHANES]    Frequency of Communication with Friends and Family: More than three times a week    Frequency of Social Gatherings with Friends and Family: More than three times a week    Attends Religious Services: Never    Marine scientist or Organizations: No    Attends Music therapist: Never    Marital Status: Divorced    Family History  Problem Relation Age of Onset   COPD Mother    Diabetes Mother    Asthma Mother    Diabetes Father    Colon cancer Father    Ovarian cancer Maternal Grandmother    Pancreatic cancer Maternal Grandfather    Depression Other     Health Maintenance  Topic Date Due   DTaP/Tdap/Td (2 - Td or Tdap) 05/19/2019   COVID-19 Vaccine (3 - Moderna risk series) 06/18/2022 (Originally 12/05/2019)   Zoster Vaccines- Shingrix (2 of 2) 07/23/2022 (Originally 04/02/2022)   Hepatitis C Screening  07/29/2022 (Originally 08/23/1985)   Medicare Annual Wellness (AWV)  07/29/2022   COLONOSCOPY (Pts 45-80yr Insurance coverage will need to be confirmed)  07/05/2023   MAMMOGRAM  08/29/2023   INFLUENZA VACCINE  Completed   HIV Screening  Completed   HPV VACCINES  Aged Out     ----------------------------------------------------------------------------------------------------------------------------------------------------------------------------------------------------------------- Physical Exam BP 113/74 (BP Location: Left Arm, Patient Position: Sitting, Cuff Size: Normal)   Pulse 92   Ht '5\' 3"'$  (1.6 m)   Wt 158 lb (71.7 kg)   SpO2 98%   BMI 27.99 kg/m   Physical Exam Constitutional:      Appearance: Normal appearance. She is well-developed.  HENT:     Head: Normocephalic and atraumatic.  Eyes:     General: No scleral icterus. Cardiovascular:      Rate and Rhythm: Normal rate and regular rhythm.  Pulmonary:     Effort: Pulmonary effort is normal.     Breath sounds: Normal breath sounds.  Musculoskeletal:     Cervical back: Neck supple.  Neurological:     Mental Status: She is alert.  Psychiatric:  Mood and Affect: Mood normal.        Behavior: Behavior normal.    EKG: Normal sinus rhythm.  Normal PR interval.  Normal QTc. ------------------------------------------------------------------------------------------------------------------------------------------------------------------------------------------------------------------- Assessment and Plan  Chest pain on breathing Chest pain seems atypical for cardiac chest pain.  She did have recent respiratory symptoms.  Chest x-ray ordered.  Her heart monitor on her watch possibly indicating atrial fibrillation.  Zio patch ordered.  Updated labs ordered today.    Prediabetes She has not well with Mounjaro.  Updating A1c.   No orders of the defined types were placed in this encounter.   No follow-ups on file.    This visit occurred during the SARS-CoV-2 public health emergency.  Safety protocols were in place, including screening questions prior to the visit, additional usage of staff PPE, and extensive cleaning of exam room while observing appropriate contact time as indicated for disinfecting solutions.

## 2022-04-23 NOTE — Assessment & Plan Note (Addendum)
She has not well with Mounjaro.  Updating A1c.

## 2022-04-24 LAB — COMPLETE METABOLIC PANEL WITH GFR
AG Ratio: 2.2 (calc) (ref 1.0–2.5)
ALT: 7 U/L (ref 6–29)
AST: 12 U/L (ref 10–35)
Albumin: 4.3 g/dL (ref 3.6–5.1)
Alkaline phosphatase (APISO): 73 U/L (ref 37–153)
BUN: 16 mg/dL (ref 7–25)
CO2: 29 mmol/L (ref 20–32)
Calcium: 9.5 mg/dL (ref 8.6–10.4)
Chloride: 106 mmol/L (ref 98–110)
Creat: 0.73 mg/dL (ref 0.50–1.03)
Globulin: 2 g/dL (calc) (ref 1.9–3.7)
Glucose, Bld: 81 mg/dL (ref 65–139)
Potassium: 4.3 mmol/L (ref 3.5–5.3)
Sodium: 143 mmol/L (ref 135–146)
Total Bilirubin: 0.4 mg/dL (ref 0.2–1.2)
Total Protein: 6.3 g/dL (ref 6.1–8.1)
eGFR: 98 mL/min/{1.73_m2} (ref >=60)

## 2022-04-24 LAB — CBC WITH DIFFERENTIAL/PLATELET
Absolute Monocytes: 397 cells/uL (ref 200–950)
Basophils Absolute: 50 cells/uL (ref 0–200)
Basophils Relative: 0.8 %
Eosinophils Absolute: 50 cells/uL (ref 15–500)
Eosinophils Relative: 0.8 %
HCT: 40.6 % (ref 35.0–45.0)
Hemoglobin: 13.4 g/dL (ref 11.7–15.5)
Lymphs Abs: 1246 cells/uL (ref 850–3900)
MCH: 29.3 pg (ref 27.0–33.0)
MCHC: 33 g/dL (ref 32.0–36.0)
MCV: 88.8 fL (ref 80.0–100.0)
MPV: 10.6 fL (ref 7.5–12.5)
Monocytes Relative: 6.4 %
Neutro Abs: 4458 cells/uL (ref 1500–7800)
Neutrophils Relative %: 71.9 %
Platelets: 252 10*3/uL (ref 140–400)
RBC: 4.57 10*6/uL (ref 3.80–5.10)
RDW: 12.3 % (ref 11.0–15.0)
Total Lymphocyte: 20.1 %
WBC: 6.2 10*3/uL (ref 3.8–10.8)

## 2022-04-24 LAB — HEMOGLOBIN A1C
Hgb A1c MFr Bld: 5.3 % of total Hgb (ref <5.7)
Mean Plasma Glucose: 105 mg/dL
eAG (mmol/L): 5.8 mmol/L

## 2022-04-24 LAB — TSH: TSH: 0.47 mIU/L

## 2022-04-24 LAB — MAGNESIUM: Magnesium: 2.3 mg/dL (ref 1.5–2.5)

## 2022-04-29 DIAGNOSIS — R0600 Dyspnea, unspecified: Secondary | ICD-10-CM

## 2022-04-29 DIAGNOSIS — R42 Dizziness and giddiness: Secondary | ICD-10-CM

## 2022-04-29 DIAGNOSIS — R071 Chest pain on breathing: Secondary | ICD-10-CM | POA: Diagnosis not present

## 2022-04-29 DIAGNOSIS — R5383 Other fatigue: Secondary | ICD-10-CM

## 2022-05-08 DIAGNOSIS — R071 Chest pain on breathing: Secondary | ICD-10-CM | POA: Diagnosis not present

## 2022-05-08 DIAGNOSIS — R42 Dizziness and giddiness: Secondary | ICD-10-CM | POA: Diagnosis not present

## 2022-05-25 ENCOUNTER — Encounter: Payer: Self-pay | Admitting: Family Medicine

## 2022-05-25 NOTE — Telephone Encounter (Signed)
I sent a message to cardiology to have them result the [place].

## 2022-05-26 DIAGNOSIS — N2889 Other specified disorders of kidney and ureter: Secondary | ICD-10-CM | POA: Diagnosis not present

## 2022-05-26 DIAGNOSIS — N200 Calculus of kidney: Secondary | ICD-10-CM | POA: Diagnosis not present

## 2022-05-26 DIAGNOSIS — N209 Urinary calculus, unspecified: Secondary | ICD-10-CM | POA: Diagnosis not present

## 2022-05-26 NOTE — Telephone Encounter (Signed)
Thanks, result note sent via mychart.

## 2022-06-09 ENCOUNTER — Telehealth: Payer: Self-pay

## 2022-06-09 DIAGNOSIS — R0982 Postnasal drip: Secondary | ICD-10-CM | POA: Diagnosis not present

## 2022-06-09 DIAGNOSIS — J111 Influenza due to unidentified influenza virus with other respiratory manifestations: Secondary | ICD-10-CM | POA: Diagnosis not present

## 2022-06-09 NOTE — Telephone Encounter (Addendum)
Initiated Prior authorization DVO:[id number] 7.'5MG'$ /0.5ML pen-injectors Via: Covermymeds Case/Key:[id number] Status: approved as of 06/09/22 Reason:This approval authorizes your coverage from 05/18/2022 - 05/18/2023 Notified Pt via: Mychart   Initiated Prior authorization VVY:[id number] '75MG'$  dispersible tablets Via: Covermymeds Case/Key:[id number] Status: approved as of 06/09/22 Reason: no pa is needed at this time , covered medication Notified Pt via: Mychart

## 2022-06-15 ENCOUNTER — Ambulatory Visit: Payer: Medicare HMO | Admitting: Psychiatry

## 2022-06-17 DIAGNOSIS — M5417 Radiculopathy, lumbosacral region: Secondary | ICD-10-CM | POA: Diagnosis not present

## 2022-06-17 DIAGNOSIS — Z5181 Encounter for therapeutic drug level monitoring: Secondary | ICD-10-CM | POA: Diagnosis not present

## 2022-06-17 DIAGNOSIS — M542 Cervicalgia: Secondary | ICD-10-CM | POA: Diagnosis not present

## 2022-06-17 DIAGNOSIS — M961 Postlaminectomy syndrome, not elsewhere classified: Secondary | ICD-10-CM | POA: Diagnosis not present

## 2022-06-17 DIAGNOSIS — Z79899 Other long term (current) drug therapy: Secondary | ICD-10-CM | POA: Diagnosis not present

## 2022-07-21 ENCOUNTER — Ambulatory Visit (INDEPENDENT_AMBULATORY_CARE_PROVIDER_SITE_OTHER): Payer: Medicare HMO

## 2022-07-21 ENCOUNTER — Encounter: Payer: Self-pay | Admitting: Family Medicine

## 2022-07-21 ENCOUNTER — Ambulatory Visit (INDEPENDENT_AMBULATORY_CARE_PROVIDER_SITE_OTHER): Payer: Medicare HMO | Admitting: Family Medicine

## 2022-07-21 VITALS — BP 113/76 | HR 99 | Ht 63.0 in | Wt 152.0 lb

## 2022-07-21 DIAGNOSIS — R0689 Other abnormalities of breathing: Secondary | ICD-10-CM

## 2022-07-21 DIAGNOSIS — D649 Anemia, unspecified: Secondary | ICD-10-CM | POA: Diagnosis not present

## 2022-07-21 DIAGNOSIS — R42 Dizziness and giddiness: Secondary | ICD-10-CM

## 2022-07-21 DIAGNOSIS — R0600 Dyspnea, unspecified: Secondary | ICD-10-CM | POA: Diagnosis not present

## 2022-07-21 DIAGNOSIS — Z0389 Encounter for observation for other suspected diseases and conditions ruled out: Secondary | ICD-10-CM | POA: Diagnosis not present

## 2022-07-21 NOTE — Patient Instructions (Addendum)
Stay well hydrated.  We'll be in touch with lab results.

## 2022-07-21 NOTE — Assessment & Plan Note (Signed)
She continues to have episodes of dizziness.  This does not seem orthostatic in nature.  She is having some dyspnea as well.  Chest x-ray and echocardiogram ordered.  Rechecking labs today.  Encouraged her to follow-up with her neurosurgeon regarding her VP shunt to be sure this is not contributing to her dizziness.

## 2022-07-21 NOTE — Progress Notes (Signed)
Katrina Fernandez - 57 y.o. female MRN [id number]  Date of birth: 1966/04/10  Subjective Chief Complaint  Patient presents with   Dizziness    HPI Katrina Fernandez is a 57 year old female here today for follow-up of dizziness.  Sure that she continues to have dizziness.  She has both symptoms of lightheadedness as well as vertiginous type symptoms.  She does have history of VP shunt.  She has not followed up with her neurosurgeon for this.  She was seen for this in December and had some labs checked as well as event monitor.  This did not show any significant abnormalities.  She does report having some increased dyspnea with exertion.  Denies palpitations symptoms at rest.  No chest pain or tightness.  ROS:  A comprehensive ROS was completed and negative except as noted per HPI  Allergies  Allergen Reactions   Penicillins Anaphylaxis    11/28/20 - patient tolerated Keflex w/o adverse effects    Tape Rash    Plastic tape, silicone tape, Steri Strips   Bactrim [Sulfamethoxazole-Trimethoprim] Nausea Only   Paxlovid [Nirmatrelvir-Ritonavir] Nausea And Vomiting   Chlorhexidine Rash and Other (See Comments)    Dermatitis - Allergic    Past Medical History:  Diagnosis Date   Anemia    Anxiety    Arthritis    "back and knees" (07/23/2016)   Chiari malformation type I ([place]) dx'd 2014   Chronic back pain    buldging, DDD; "neck and lower back" (07/23/2016)   Chronic constipation    takes Miralax daily   Chronic pain syndrome    takes Methadone and Keppra daily   Depression    takes Effexor daily   Endometriosis    1995   Fibromyalgia    GERD (gastroesophageal reflux disease)    not taking anything at the present time   Headache    History of DVT of lower extremity 07/2012   LEFT LEG --  3/ 2014.Was on a blood transfusion for 6 months   History of kidney stones    History of MRSA infection 10 yrs ago   Hyperlipidemia    takes Simvastatin daily   Migraine    "daily recently" (07/23/2016)    Muscle spasm    takes Zanaflex nightly   Peripheral edema    takes Lasix daily as needed   Peripheral neuropathy    Pleurisy 2000   Pneumonia    hx of-7-8 yrs ago   PONV (postoperative nausea and vomiting)    Seasonal allergies    takes Claritin daily   Spinal headache    "since 2002; still get them; try to manage it w/lying flat"    Past Surgical History:  Procedure Laterality Date   ANTERIOR CERVICAL DECOMP/DISCECTOMY FUSION  07/25/2009   C5 -- C6   APPENDECTOMY  age 25   APPLICATION OF CRANIAL NAVIGATION N/A 06/23/2017   Procedure: APPLICATION OF CRANIAL NAVIGATION;  Surgeon: Kary Kos, MD;  Location: [place];  Service: Neurosurgery;  Laterality: N/A;   BACK SURGERY     BRAIN SURGERY     VP shunt   BREAST REDUCTION SURGERY Bilateral 06/07/2013   Procedure: BILATERAL MAMMARY REDUCTION  (BREAST);  Surgeon: Theodoro Kos, DO;  Location: [facility];  Service: Plastics;  Laterality: Bilateral;   BREAST REDUCTION SURGERY Bilateral 06/07/2013   Procedure:  LIPOSUCTION;  Surgeon: Theodoro Kos, DO;  Location: [facility];  Service: Plastics;  Laterality: Bilateral;   CARPAL TUNNEL RELEASE Bilateral 2001   COLONOSCOPY  X 2   "polyps were benign"   CYSTOSCOPY WITH RETROGRADE PYELOGRAM, URETEROSCOPY AND STENT PLACEMENT Left 05/2021   (in my chart)   DILATION AND CURETTAGE OF UTERUS     ESOPHAGOGASTRODUODENOSCOPY     gastric banding undone  11/2014   LAPAROSCOPIC CHOLECYSTECTOMY  ~ 2000   LAPAROSCOPIC GASTRIC BANDING  2011   LAPAROSCOPIC REVISION VENTRICULAR-PERITONEAL (V-P) SHUNT N/A 06/23/2017   Procedure: Shunt Placment stereotactic ventricular catheter placement and general surgery abdominal exposure  (Laparoscopic vs. Open) Brain Lab;  Surgeon: Clovis Riley, MD;  Location: [facility];  Service: General;  Laterality: N/A;   LAPAROSCOPY N/A 06/23/2017   Procedure: LAPAROSCOPY DIAGNOSTIC;  Surgeon: Clovis Riley, MD;  Location: [facility];  Service:  General;  Laterality: N/A;   LUMBAR LAMINECTOMY/DECOMPRESSION MICRODISCECTOMY N/A 07/23/2016   Procedure: Decompressive Lumbar Laminectomy Lumbar two-three  for Repair of  Cerberal Spinal Fluid  Leak;  Surgeon: Kary Kos, MD;  Location: [facility];  Service: Neurosurgery;  Laterality: N/A;   LUMBAR LAMINECTOMY/DECOMPRESSION MICRODISCECTOMY Left 03/01/2019   Procedure: LEFT LUMBAR THREE-FOUR EXTRAFORAMINAL MICRODISCECTOMY;  Surgeon: Kary Kos, MD;  Location: [facility];  Service: Neurosurgery;  Laterality: Left;   PLACEMENT LUMBOPERITONEAL SHUNT FOR CEREBROSPINAL FLUID DIVERSION AWAY FROM C8 PERINEURAL CYST  09-05-2003  &  12-24-2003   08-31-2003  REMOVAL SHUNT   POSTERIOR CERVICAL LAMINECTOMY  03/01/2001   C7-8 and Exploration C8 perineural cyst   POSTERIOR LUMBAR LAMINECTOMY L4-5/  DISKECTOMY L5-S1  05/20/2005   RE-DO DECOMPRESSION LAMINECTOMY AND FUSION L4-5  &  L5-S1  12/20/2006   02-04-2007--  RE-EXPLORATION L4 to S1, I & D LUMBAR WOUND HEMATOMA   REDUCTION MAMMAPLASTY     SHUNT REMOVAL N/A 07/10/2016   Procedure: SHUNT REMOVAL;  Surgeon: Kary Kos, MD;  Location: [facility];  Service: Neurosurgery;  Laterality: N/A;  SHUNT REMOVAL   SPINAL CORD STIMULATOR INSERTION N/A 04/26/2015   Procedure: LUMBAR SPINAL CORD STIMULATOR INSERTION;  Surgeon: Clydell Hakim, MD;  Location: [facility];  Service: Neurosurgery;  Laterality: N/A;  LUMBAR SPINAL CORD STIMULATOR INSERTION   SPINAL CORD STIMULATOR REMOVAL N/A 06/28/2019   Procedure: Removal of dorsal column stimulator and generator -;  Surgeon: Kary Kos, MD;  Location: [facility];  Service: Neurosurgery;  Laterality: N/A;   TENNIS ELBOW RELEASE/NIRSCHEL PROCEDURE Left 12/13/2014   Procedure: LEFT ELBOW LATERAL EPICONDYLAR DEBRIDEMENT AND OR REPAIR -RECONSTRUCTION ;  Surgeon: Iran Planas, MD;  Location: [facility];  Service: Orthopedics;  Laterality: Left;   TOTAL ABDOMINAL HYSTERECTOMY  1994   w/  Bilateral Salpinoophorectomy    VENTRICULOPERITONEAL SHUNT N/A 06/23/2017   Procedure: VENTRICULAR-PERITONEAL SHUNT INSERTION WITH ABDOMINAL LAPARASCOPY AND BRAINLAB NAVIGATION;  Surgeon: Kary Kos, MD;  Location: [facility];  Service: Neurosurgery;  Laterality: N/A;    Social History   Socioeconomic History   Marital status: Divorced    Spouse name: Not on file   Number of children: 2   Years of education: 14   Highest education level: Associate degree: academic program  Occupational History   Occupation: Disabled   Tobacco Use   Smoking status: Never   Smokeless tobacco: Never  Scientific laboratory technician Use: Never used  Substance and Sexual Activity   Alcohol use: Yes    Comment: once a year   Drug use: No   Sexual activity: Not Currently    Birth control/protection: Surgical  Other Topics Concern   Not on file  Social History Narrative   Mrs Marcucci is divorced, on  permanent disability and lives at home with her mother and her daughter. She is independent in her care needs. She denies concern with transportation to medical appointments She is on a fixed income.   Caffiene products- one hot tea in am.   Education: [employer] Child [employer]   Not working.    Social Determinants of Health   Financial Resource Strain: Low Risk  (07/28/2021)   Overall Financial Resource Strain (CARDIA)    Difficulty of Paying Living Expenses: Not hard at all  Food Insecurity: No Food Insecurity (07/28/2021)   Hunger Vital Sign    Worried About Running Out of Food in the Last Year: Never true    Ran Out of Food in the Last Year: Never true  Transportation Needs: No Transportation Needs (07/28/2021)   PRAPARE - Hydrologist (Medical): No    Lack of Transportation (Non-Medical): No  Physical Activity: Inactive (07/28/2021)   Exercise Vital Sign    Days of Exercise per Week: 0 days    Minutes of Exercise per Session: 0 min  Stress: No Stress Concern Present (07/28/2021)   [place]    Feeling of Stress : Not at all  Social Connections: Socially Isolated (07/28/2021)   Social Connection and Isolation Panel [NHANES]    Frequency of Communication with Friends and Family: More than three times a week    Frequency of Social Gatherings with Friends and Family: More than three times a week    Attends Religious Services: Never    Marine scientist or Organizations: No    Attends Music therapist: Never    Marital Status: Divorced    Family History  Problem Relation Age of Onset   COPD Mother    Diabetes Mother    Asthma Mother    Diabetes Father    Colon cancer Father    Ovarian cancer Maternal Grandmother    Pancreatic cancer Maternal Grandfather    Depression Other     Health Maintenance  Topic Date Due   DTaP/Tdap/Td (2 - Td or Tdap) 05/19/2019   COVID-19 Vaccine (3 - Moderna risk series) 12/05/2019   Medicare Annual Wellness (AWV)  08/29/2022   Zoster Vaccines- Shingrix (2 of 2) 07/23/2022 (Originally 04/02/2022)   Hepatitis C Screening  07/29/2022 (Originally 08/23/1985)   COLONOSCOPY (Pts 45-59yr Insurance coverage will need to be confirmed)  07/05/2023   MAMMOGRAM  08/29/2023   INFLUENZA VACCINE  Completed   HIV Screening  Completed   HPV VACCINES  Aged Out     ----------------------------------------------------------------------------------------------------------------------------------------------------------------------------------------------------------------- Physical Exam BP 113/76 (BP Location: Left Arm, Patient Position: Sitting, Cuff Size: Normal)   Pulse 99   Ht '5\' 3"'$  (1.6 m)   Wt 152 lb (68.9 kg)   SpO2 95%   BMI 26.93 kg/m   Physical Exam Constitutional:      Appearance: Normal appearance.  HENT:     Head: Normocephalic and atraumatic.  Eyes:     General: No scleral icterus. Cardiovascular:     Rate and Rhythm: Normal rate and regular rhythm.  Pulmonary:      Effort: Pulmonary effort is normal.     Breath sounds: Normal breath sounds.  Musculoskeletal:     Cervical back: Neck supple.  Neurological:     Mental Status: She is alert.  Psychiatric:        Mood and Affect: Mood normal.        Behavior: Behavior  normal.     ------------------------------------------------------------------------------------------------------------------------------------------------------------------------------------------------------------------- Assessment and Plan  Dizziness She continues to have episodes of dizziness.  This does not seem orthostatic in nature.  She is having some dyspnea as well.  Chest x-ray and echocardiogram ordered.  Rechecking labs today.  Encouraged her to follow-up with her neurosurgeon regarding her VP shunt to be sure this is not contributing to her dizziness.   No orders of the defined types were placed in this encounter.   No follow-ups on file.    This visit occurred during the SARS-CoV-2 public health emergency.  Safety protocols were in place, including screening questions prior to the visit, additional usage of staff PPE, and extensive cleaning of exam room while observing appropriate contact time as indicated for disinfecting solutions.

## 2022-07-22 LAB — CBC WITH DIFFERENTIAL/PLATELET
Absolute Monocytes: 429 cells/uL (ref 200–950)
Basophils Absolute: 67 cells/uL (ref 0–200)
Basophils Relative: 1 %
Eosinophils Absolute: 40 cells/uL (ref 15–500)
Eosinophils Relative: 0.6 %
HCT: 42.9 % (ref 35.0–45.0)
Hemoglobin: 14.2 g/dL (ref 11.7–15.5)
Lymphs Abs: 1595 cells/uL (ref 850–3900)
MCH: 29.8 pg (ref 27.0–33.0)
MCHC: 33.1 g/dL (ref 32.0–36.0)
MCV: 90.1 fL (ref 80.0–100.0)
MPV: 10.6 fL (ref 7.5–12.5)
Monocytes Relative: 6.4 %
Neutro Abs: 4569 cells/uL (ref 1500–7800)
Neutrophils Relative %: 68.2 %
Platelets: 287 10*3/uL (ref 140–400)
RBC: 4.76 10*6/uL (ref 3.80–5.10)
RDW: 12 % (ref 11.0–15.0)
Total Lymphocyte: 23.8 %
WBC: 6.7 10*3/uL (ref 3.8–10.8)

## 2022-07-22 LAB — BASIC METABOLIC PANEL
BUN: 16 mg/dL (ref 7–25)
CO2: 30 mmol/L (ref 20–32)
Calcium: 10 mg/dL (ref 8.6–10.4)
Chloride: 104 mmol/L (ref 98–110)
Creat: 0.84 mg/dL (ref 0.50–1.03)
Glucose, Bld: 72 mg/dL (ref 65–99)
Potassium: 4.1 mmol/L (ref 3.5–5.3)
Sodium: 143 mmol/L (ref 135–146)

## 2022-07-22 LAB — IRON,TIBC AND FERRITIN PANEL
%SAT: 25 % (calc) (ref 16–45)
Ferritin: 37 ng/mL (ref 16–232)
Iron: 83 ug/dL (ref 45–160)
TIBC: 326 mcg/dL (calc) (ref 250–450)

## 2022-07-22 LAB — TSH: TSH: 0.93 mIU/L

## 2022-07-24 ENCOUNTER — Encounter: Payer: Self-pay | Admitting: Family Medicine

## 2022-07-28 ENCOUNTER — Encounter: Payer: Self-pay | Admitting: Family Medicine

## 2022-07-29 NOTE — Telephone Encounter (Signed)
Katrina Fernandez,  Were you able to complete PA for echocardiogram? Please advise.

## 2022-07-30 NOTE — Telephone Encounter (Signed)
Per insurance, no auth or pre-cert required for this procedure. Patient has been updated of the following information via MyChart.

## 2022-08-03 ENCOUNTER — Ambulatory Visit (INDEPENDENT_AMBULATORY_CARE_PROVIDER_SITE_OTHER): Payer: Medicare HMO | Admitting: Family Medicine

## 2022-08-03 VITALS — BP 132/88 | HR 87 | Ht 63.0 in | Wt 154.0 lb

## 2022-08-03 DIAGNOSIS — Z1231 Encounter for screening mammogram for malignant neoplasm of breast: Secondary | ICD-10-CM | POA: Diagnosis not present

## 2022-08-03 DIAGNOSIS — Z0000 Encounter for general adult medical examination without abnormal findings: Secondary | ICD-10-CM

## 2022-08-03 NOTE — Progress Notes (Signed)
[place] VISIT  08/03/2022  Subjective:  Katrina Fernandez is a 57 y.o. female patient of Katrina Nutting, DO who had a Medicare Annual Wellness Visit today. Katrina Fernandez is Disabled and lives with their family. she has 2 children. she reports that she is socially active and does interact with friends/family regularly. she is moderately physically active and enjoys crafting and quilting.  Patient Care Team: Katrina Nutting, DO as PCP - General (Family Medicine) Dr. Damien Fusi (Pain Medicine) Kary Kos, MD as Consulting Physician (Neurosurgery) Darius Bump, [facility] (Pharmacist) Darius Bump, [facility] as Pharmacist (Pharmacist)     08/03/2022    8:15 AM 07/28/2021    8:10 AM 07/24/2020    9:10 AM 06/28/2019    9:03 AM 06/26/2019    8:59 AM 02/27/2019    9:11 AM 11/09/2018    1:16 PM  Advanced Directives  Does Patient Have a Medical Advance Directive? No No No No No No No  Would patient like information on creating a medical advance directive? No - Patient declined No - Patient declined No - Patient declined No - Guardian declined  No - Patient declined No - Patient declined    Hospital Utilization Over the Past 12 Months: # of hospitalizations or ER visits: 0 # of surgeries: 0  Review of Systems    Patient reports that her overall health is better when compared to last year.  Review of Systems: History obtained from chart review and the patient  All other systems negative.  Pain Assessment Pain : 0-10 Pain Score: 4  Pain Type: Acute pain Pain Location: Knee (all joints) Pain Orientation: Left, Right Pain Descriptors / Indicators: Sharp, Burning, Throbbing Pain Onset: More than a month ago Pain Frequency: Intermittent Pain Relieving Factors: none  Pain Relieving Factors: none  Current Medications & Allergies (verified) Allergies as of 08/03/2022       Reactions   Penicillins Anaphylaxis   11/28/20 - patient tolerated Keflex w/o adverse effects   Tape Rash   Plastic  tape, silicone tape, Steri Strips   Bactrim [sulfamethoxazole-trimethoprim] Nausea Only   Paxlovid [nirmatrelvir-ritonavir] Nausea And Vomiting   Chlorhexidine Rash, Other (See Comments)   Dermatitis - Allergic        Medication List        Accurate as of August 03, 2022  8:30 AM. If you have any questions, ask your nurse or doctor.          DULoxetine 60 MG capsule Commonly known as: CYMBALTA TAKE 2 CAPSULES BY MOUTH EVERY DAY   methocarbamol 500 MG tablet Commonly known as: ROBAXIN Take 500 mg by mouth every 6 (six) hours as needed for muscle spasms.   montelukast 10 MG tablet Commonly known as: SINGULAIR TAKE 1 TABLET BY MOUTH EVERYDAY AT BEDTIME   Mounjaro 12.5 MG/0.5ML Pen Generic drug: tirzepatide INJECT 12.5 MG SUBCUTANEOUSLY ONE TIME PER WEEK   Narcan 4 MG/0.1ML Liqd nasal spray kit Generic drug: naloxone Place 0.4 mg into the nose once.   polyethylene glycol 17 g packet Commonly known as: MIRALAX / GLYCOLAX Take 17 g by mouth daily as needed for mild constipation or moderate constipation.   pramipexole 0.5 MG tablet Commonly known as: MIRAPEX TAKE 1 TABLET BY MOUTH EVERYDAY AT BEDTIME   rizatriptan 10 MG tablet Commonly known as: Maxalt Take 1 tablet (10 mg total) by mouth as needed for migraine. May repeat in 2 hours if needed   simvastatin 20 MG tablet Commonly known as: ZOCOR TAKE 1  TABLET BY MOUTH EVERY DAY   traMADol 50 MG tablet Commonly known as: ULTRAM Take 50-100 mg by mouth every 8 (eight) hours as needed (Pain).        History (reviewed): Past Medical History:  Diagnosis Date   Anemia    Anxiety    Arthritis    "back and knees" (07/23/2016)   Chiari malformation type I ([place]) dx'd 2014   Chronic back pain    buldging, DDD; "neck and lower back" (07/23/2016)   Chronic constipation    takes Miralax daily   Chronic pain syndrome    takes Methadone and Keppra daily   Depression    takes Effexor daily   Endometriosis    1995    Fibromyalgia    GERD (gastroesophageal reflux disease)    not taking anything at the present time   Headache    History of DVT of lower extremity 07/2012   LEFT LEG --  3/ 2014.Was on a blood transfusion for 6 months   History of kidney stones    History of MRSA infection 10 yrs ago   Hyperlipidemia    takes Simvastatin daily   Migraine    "daily recently" (07/23/2016)   Muscle spasm    takes Zanaflex nightly   Peripheral edema    takes Lasix daily as needed   Peripheral neuropathy    Pleurisy 2000   Pneumonia    hx of-7-8 yrs ago   PONV (postoperative nausea and vomiting)    Seasonal allergies    takes Claritin daily   Spinal headache    "since 2002; still get them; try to manage it w/lying flat"   Past Surgical History:  Procedure Laterality Date   ANTERIOR CERVICAL DECOMP/DISCECTOMY FUSION  07/25/2009   C5 -- C6   APPENDECTOMY  age 54   APPLICATION OF CRANIAL NAVIGATION N/A 06/23/2017   Procedure: APPLICATION OF CRANIAL NAVIGATION;  Surgeon: Kary Kos, MD;  Location: [place];  Service: Neurosurgery;  Laterality: N/A;   BACK SURGERY     BRAIN SURGERY     VP shunt   BREAST REDUCTION SURGERY Bilateral 06/07/2013   Procedure: BILATERAL MAMMARY REDUCTION  (BREAST);  Surgeon: Theodoro Kos, DO;  Location: [place];  Service: Plastics;  Laterality: Bilateral;   BREAST REDUCTION SURGERY Bilateral 06/07/2013   Procedure:  LIPOSUCTION;  Surgeon: Theodoro Kos, DO;  Location: [place];  Service: Plastics;  Laterality: Bilateral;   CARPAL TUNNEL RELEASE Bilateral 2001   COLONOSCOPY  X 2   "polyps were benign"   CYSTOSCOPY WITH RETROGRADE PYELOGRAM, URETEROSCOPY AND STENT PLACEMENT Left 05/2021   (in my chart)   DILATION AND CURETTAGE OF UTERUS     ESOPHAGOGASTRODUODENOSCOPY     gastric banding undone  11/2014   LAPAROSCOPIC CHOLECYSTECTOMY  ~ 2000   LAPAROSCOPIC GASTRIC BANDING  2011   LAPAROSCOPIC REVISION VENTRICULAR-PERITONEAL (V-P) SHUNT N/A  06/23/2017   Procedure: Shunt Placment stereotactic ventricular catheter placement and general surgery abdominal exposure  (Laparoscopic vs. Open) Brain Lab;  Surgeon: Clovis Riley, MD;  Location: [place];  Service: General;  Laterality: N/A;   LAPAROSCOPY N/A 06/23/2017   Procedure: LAPAROSCOPY DIAGNOSTIC;  Surgeon: Clovis Riley, MD;  Location: [place];  Service: General;  Laterality: N/A;   LUMBAR LAMINECTOMY/DECOMPRESSION MICRODISCECTOMY N/A 07/23/2016   Procedure: Decompressive Lumbar Laminectomy Lumbar two-three  for Repair of  Cerberal Spinal Fluid  Leak;  Surgeon: Kary Kos, MD;  Location: [place];  Service: Neurosurgery;  Laterality: N/A;  LUMBAR LAMINECTOMY/DECOMPRESSION MICRODISCECTOMY Left 03/01/2019   Procedure: LEFT LUMBAR THREE-FOUR EXTRAFORAMINAL MICRODISCECTOMY;  Surgeon: Kary Kos, MD;  Location: [place];  Service: Neurosurgery;  Laterality: Left;   PLACEMENT LUMBOPERITONEAL SHUNT FOR CEREBROSPINAL FLUID DIVERSION AWAY FROM C8 PERINEURAL CYST  09-05-2003  &  12-24-2003   08-31-2003  REMOVAL SHUNT   POSTERIOR CERVICAL LAMINECTOMY  03/01/2001   C7-8 and Exploration C8 perineural cyst   POSTERIOR LUMBAR LAMINECTOMY L4-5/  DISKECTOMY L5-S1  05/20/2005   RE-DO DECOMPRESSION LAMINECTOMY AND FUSION L4-5  &  L5-S1  12/20/2006   02-04-2007--  RE-EXPLORATION L4 to S1, I & D LUMBAR WOUND HEMATOMA   REDUCTION MAMMAPLASTY     SHUNT REMOVAL N/A 07/10/2016   Procedure: SHUNT REMOVAL;  Surgeon: Kary Kos, MD;  Location: [place];  Service: Neurosurgery;  Laterality: N/A;  SHUNT REMOVAL   SPINAL CORD STIMULATOR INSERTION N/A 04/26/2015   Procedure: LUMBAR SPINAL CORD STIMULATOR INSERTION;  Surgeon: Clydell Hakim, MD;  Location: [place] [facility];  Service: Neurosurgery;  Laterality: N/A;  LUMBAR SPINAL CORD STIMULATOR INSERTION   SPINAL CORD STIMULATOR REMOVAL N/A 06/28/2019   Procedure: Removal of dorsal column stimulator and generator -;  Surgeon: Kary Kos, MD;  Location: [place];  Service:  Neurosurgery;  Laterality: N/A;   TENNIS ELBOW RELEASE/NIRSCHEL PROCEDURE Left 12/13/2014   Procedure: LEFT ELBOW LATERAL EPICONDYLAR DEBRIDEMENT AND OR REPAIR -RECONSTRUCTION ;  Surgeon: Iran Planas, MD;  Location: [place];  Service: Orthopedics;  Laterality: Left;   TOTAL ABDOMINAL HYSTERECTOMY  1994   w/  Bilateral Salpinoophorectomy   VENTRICULOPERITONEAL SHUNT N/A 06/23/2017   Procedure: VENTRICULAR-PERITONEAL SHUNT INSERTION WITH ABDOMINAL LAPARASCOPY AND BRAINLAB NAVIGATION;  Surgeon: Kary Kos, MD;  Location: [place];  Service: Neurosurgery;  Laterality: N/A;   Family History  Problem Relation Age of Onset   COPD Mother    Diabetes Mother    Asthma Mother    Diabetes Father    Colon cancer Father    Ovarian cancer Maternal Grandmother    Pancreatic cancer Maternal Grandfather    Depression Other    Social History   Socioeconomic History   Marital status: Divorced    Spouse name: Not on file   Number of children: 2   Years of education: 14   Highest education level: Associate degree: academic program  Occupational History   Occupation: Disabled   Tobacco Use   Smoking status: Never   Smokeless tobacco: Never  Scientific laboratory technician Use: Never used  Substance and Sexual Activity   Alcohol use: Yes    Comment: once a year   Drug use: No   Sexual activity: Not Currently    Birth control/protection: Surgical  Other Topics Concern   Not on file  Social History Narrative   Katrina Fernandez is divorced, on permanent disability and lives at home with her mother and her daughter. She is independent in her care needs. She denies concern with transportation to medical appointments She is on a fixed income. She enjoys crafting and quilting.   Social Determinants of Health   Financial Resource Strain: Low Risk  (08/03/2022)   Overall Financial Resource Strain (CARDIA)    Difficulty of Paying Living Expenses: Not hard at all  Food Insecurity: No Food Insecurity  (08/03/2022)   Hunger Vital Sign    Worried About Running Out of Food in the Last Year: Never true    Ran Out of Food in the Last Year: Never true  Transportation Needs: No Transportation Needs (  08/03/2022)   PRAPARE - Hydrologist (Medical): No    Lack of Transportation (Non-Medical): No  Physical Activity: Inactive (08/03/2022)   Exercise Vital Sign    Days of Exercise per Week: 0 days    Minutes of Exercise per Session: 0 min  Stress: Stress Concern Present (08/03/2022)   [place]    Feeling of Stress : To some extent  Social Connections: Moderately Isolated (08/03/2022)   Social Connection and Isolation Panel [NHANES]    Frequency of Communication with Friends and Family: Three times a week    Frequency of Social Gatherings with Friends and Family: More than three times a week    Attends Religious Services: Never    Marine scientist or Organizations: Yes    Attends Music therapist: More than 4 times per year    Marital Status: Divorced    Activities of Daily Living    08/03/2022    8:21 AM  In your present state of health, do you have any difficulty performing the following activities:  Hearing? 0  Vision? 1  Comment some difficulty; due for eye exam  Difficulty concentrating or making decisions? 1  Comment some difficulty remembering  Walking or climbing stairs? 0  Dressing or bathing? 0  Doing errands, shopping? 0  Preparing Food and eating ? N  Using the Toilet? N  In the past six months, have you accidently leaked urine? N  Do you have problems with loss of bowel control? N  Managing your Medications? N  Managing your Finances? N  Housekeeping or managing your Housekeeping? N    Patient Education/Literacy How often do you need to have someone help you when you read instructions, pamphlets, or other written materials from your doctor or pharmacy?: 1 -  Never What is the last grade level you completed in school?: 2nd year of college  Exercise Current Exercise Habits: The patient does not participate in regular exercise at present, Exercise limited by: None identified  Diet Patient reports consuming 2 meals a day and 2 snack(s) a day Patient reports that her primary diet is: Regular Patient reports that she does have regular access to food.   Depression Screen    08/03/2022    8:15 AM 07/21/2022    3:18 PM 02/05/2022    8:46 AM 07/28/2021    8:11 AM 06/16/2021    3:25 PM 07/24/2020    9:11 AM 07/19/2019   12:25 PM  PHQ 2/9 Scores  PHQ - 2 Score 0 0 0 1 1 0   PHQ- 9 Score    7  0   Exception Documentation       Patient refusal     Fall Risk    08/03/2022    8:15 AM 07/21/2022    3:18 PM 02/05/2022    8:46 AM 08/05/2021    9:41 AM 07/28/2021    8:10 AM  Fall Risk   Falls in the past year? 0 0 0 0 0  Number falls in past yr: 0 0 0 0 0  Injury with Fall? 0 0 0 0 0  Risk for fall due to : No Fall Risks No Fall Risks No Fall Risks No Fall Risks No Fall Risks  Follow up Falls evaluation completed Falls evaluation completed Falls evaluation completed Falls evaluation completed Falls evaluation completed     Objective:   BP 132/88   Pulse 87  Ht 5\' 3"  (1.6 m)   Wt 154 lb (69.9 kg)   SpO2 100%   BMI 27.28 kg/m   Last Weight  Most recent update: 08/03/2022  8:11 AM    Weight  69.9 kg (154 lb)             Body mass index is 27.28 kg/m.  Hearing/Vision  Torrie did not have difficulty with hearing/understanding during the face-to-face interview Lelsie did not have difficulty with her vision during the face-to-face interview Reports that she has not had a formal eye exam by an eye care professional within the past year Reports that she has not had a formal hearing evaluation within the past year  Cognitive Function:    08/03/2022    8:23 AM 07/28/2021    8:20 AM 07/24/2020    9:26 AM  6CIT Screen  What Year? 0 points 0  points 0 points  What month? 0 points 0 points 0 points  What time? 0 points 0 points 0 points  Count back from 20 0 points 0 points 0 points  Months in reverse 0 points 0 points 0 points  Repeat phrase 0 points 0 points 0 points  Total Score 0 points 0 points 0 points    Normal Cognitive Function Screening: Yes (Normal:0-7, Significant for Dysfunction: >8)  Immunization & Health Maintenance Record Immunization History  Administered Date(s) Administered   Influenza Split 02/15/2013   Influenza,inj,Quad PF,6+ Mos 01/13/2018, 01/10/2019, 02/19/2021, 02/05/2022   Influenza-Unspecified 02/10/2015, 02/11/2017   Moderna Sars-Covid-2 Vaccination 10/17/2019, 11/07/2019   PPD Test 10/17/2014   Tdap 05/18/2009   Zoster Recombinat (Shingrix) 02/05/2022    Health Maintenance  Topic Date Due   DTaP/Tdap/Td (2 - Td or Tdap) 05/19/2019   COVID-19 Vaccine (3 - Moderna risk series) 08/19/2022 (Originally 12/05/2019)   Zoster Vaccines- Shingrix (2 of 2) 11/03/2022 (Originally 04/02/2022)   Hepatitis C Screening  08/03/2023 (Originally 08/23/1983)   COLONOSCOPY (Pts 45-9yrs Insurance coverage will need to be confirmed)  07/05/2023   Medicare Annual Wellness (AWV)  08/03/2023   MAMMOGRAM  08/29/2023   INFLUENZA VACCINE  Completed   HIV Screening  Completed   HPV VACCINES  Aged Out       Assessment  This is a routine wellness examination for [employer].  Health Maintenance: Due or Overdue Health Maintenance Due  Topic Date Due   DTaP/Tdap/Td (2 - Td or Tdap) 05/19/2019    [name] does not need a referral for Community Assistance: Care Management:   no Social Work:    no Prescription Assistance:  no Nutrition/Diabetes Education:  no   Plan:  Personalized Goals  Goals Addressed               This Visit's Progress     Patient Stated (pt-stated)        Patient stated that she would like to make an exercise routine and also loose 10 more lbs.        Personalized Health Maintenance & Screening Recommendations  Td vaccine 2nd dose of shingles vaccine Mammogram due in April  Lung Cancer Screening Recommended: no (Low Dose CT Chest recommended if Age 31-80 years, 30 pack-year currently smoking OR have quit w/in past 15 years) Hepatitis C Screening recommended: yes HIV Screening recommended: no  Advanced Directives: Written information was not given per the patient's request.  Referrals & Orders Orders Placed This Encounter  Procedures   Mammogram 3D SCREEN BREAST BILATERAL    Follow-up Plan Follow-up  with Katrina Nutting, DO as planned Schedule eye exam. Schedule 2nd dose shingles vaccine and tetanus at the pharmacy.  Medicare wellness visit in one year. AVS printed and given to patient.   I have personally reviewed and noted the following in the patient's chart:   Medical and social history Use of alcohol, tobacco or illicit drugs  Current medications and supplements Functional ability and status Nutritional status Physical activity Advanced directives List of other physicians Hospitalizations, surgeries, and ER visits in previous 12 months Vitals Screenings to include cognitive, depression, and falls Referrals and appointments  In addition, I have reviewed and discussed with patient certain preventive protocols, quality metrics, and best practice recommendations. A written personalized care plan for preventive services as well as general preventive health recommendations were provided to patient.     Tinnie Gens, RN BSN  08/03/2022

## 2022-08-03 NOTE — Patient Instructions (Addendum)
[place] Maintenance Summary and Written Plan of Care  [name] ,  Thank you for allowing me to perform your Medicare Annual Wellness Visit and for your ongoing commitment to your health.   Health Maintenance & Immunization History Health Maintenance  Topic Date Due   DTaP/Tdap/Td (2 - Td or Tdap) 05/19/2019   COVID-19 Vaccine (3 - Moderna risk series) 08/19/2022 (Originally 12/05/2019)   Zoster Vaccines- Shingrix (2 of 2) 11/03/2022 (Originally 04/02/2022)   Hepatitis C Screening  08/03/2023 (Originally 08/23/1985)   COLONOSCOPY (Pts 45-6yrs Insurance coverage will need to be confirmed)  07/05/2023   Medicare Annual Wellness (AWV)  08/03/2023   MAMMOGRAM  08/29/2023   INFLUENZA VACCINE  Completed   HIV Screening  Completed   HPV VACCINES  Aged Out   Immunization History  Administered Date(s) Administered   Influenza Split 02/15/2013   Influenza,inj,Quad PF,6+ Mos 01/13/2018, 01/10/2019, 02/19/2021, 02/05/2022   Influenza-Unspecified 02/10/2015, 02/11/2017   Moderna Sars-Covid-2 Vaccination 10/17/2019, 11/07/2019   PPD Test 10/17/2014   Tdap 05/18/2009   Zoster Recombinat (Shingrix) 02/05/2022    These are the patient goals that we discussed:  Goals Addressed               This Visit's Progress     Patient Stated (pt-stated)        Patient stated that she would like to make an exercise routine and also loose 10 more lbs.         This is a list of Health Maintenance Items that are overdue or due now: Health Maintenance Due  Topic Date Due   DTaP/Tdap/Td (2 - Td or Tdap) 05/19/2019   2nd dose of shingles vaccine Mammogram due in April  Orders/Referrals Placed Today: Orders Placed This Encounter  Procedures   Mammogram 3D SCREEN BREAST BILATERAL    Standing Status:   Future    Standing Expiration Date:   08/03/2023    Scheduling Instructions:     Please call patient to schedule.    Order Specific Question:   Reason for Exam  (SYMPTOM  OR DIAGNOSIS REQUIRED)    Answer:   Breast cancer screening    Order Specific Question:   Is the patient pregnant?    Answer:   No    Order Specific Question:   Preferred imaging location?    Answer:   [facility]   (Contact our referral department at [phone] if you have not spoken with someone about your referral appointment within the next 5 days)    Follow-up Plan Follow-up with Luetta Nutting, DO as planned Schedule eye exam. Schedule 2nd dose shingles vaccine and tetanus at the pharmacy.  Medicare wellness visit in one year. AVS printed and given to patient.      Health Maintenance, Female Adopting a healthy lifestyle and getting preventive care are important in promoting health and wellness. Ask your health care provider about: The right schedule for you to have regular tests and exams. Things you can do on your own to prevent diseases and keep yourself healthy. What should I know about diet, weight, and exercise? Eat a healthy diet  Eat a diet that includes plenty of vegetables, fruits, low-fat dairy products, and lean protein. Do not eat a lot of foods that are high in solid fats, added sugars, or sodium. Maintain a healthy weight Body mass index (BMI) is used to identify weight problems. It estimates body fat based on height and weight. Your health care provider can help  determine your BMI and help you achieve or maintain a healthy weight. Get regular exercise Get regular exercise. This is one of the most important things you can do for your health. Most adults should: Exercise for at least 150 minutes each week. The exercise should increase your heart rate and make you sweat (moderate-intensity exercise). Do strengthening exercises at least twice a week. This is in addition to the moderate-intensity exercise. Spend less time sitting. Even light physical activity can be beneficial. Watch cholesterol and blood lipids Have your blood tested for  lipids and cholesterol at 55 years of age, then have this test every 5 years. Have your cholesterol levels checked more often if: Your lipid or cholesterol levels are high. You are older than 55 years of age. You are at high risk for heart disease. What should I know about cancer screening? Depending on your health history and family history, you may need to have cancer screening at various ages. This may include screening for: Breast cancer. Cervical cancer. Colorectal cancer. Skin cancer. Lung cancer. What should I know about heart disease, diabetes, and high blood pressure? Blood pressure and heart disease High blood pressure causes heart disease and increases the risk of stroke. This is more likely to develop in people who have high blood pressure readings or are overweight. Have your blood pressure checked: Every 3-5 years if you are 51-68 years of age. Every year if you are 35 years old or older. Diabetes Have regular diabetes screenings. This checks your fasting blood sugar level. Have the screening done: Once every three years after age 29 if you are at a normal weight and have a low risk for diabetes. More often and at a younger age if you are overweight or have a high risk for diabetes. What should I know about preventing infection? Hepatitis B If you have a higher risk for hepatitis B, you should be screened for this virus. Talk with your health care provider to find out if you are at risk for hepatitis B infection. Hepatitis C Testing is recommended for: Everyone born from [birth year] through 1963. Anyone with known risk factors for hepatitis C. Sexually transmitted infections (STIs) Get screened for STIs, including gonorrhea and chlamydia, if: You are sexually active and are younger than 55 years of age. You are older than 55 years of age and your health care provider tells you that you are at risk for this type of infection. Your sexual activity has changed since you were last  screened, and you are at increased risk for chlamydia or gonorrhea. Ask your health care provider if you are at risk. Ask your health care provider about whether you are at high risk for HIV. Your health care provider may recommend a prescription medicine to help prevent HIV infection. If you choose to take medicine to prevent HIV, you should first get tested for HIV. You should then be tested every 3 months for as long as you are taking the medicine. Pregnancy If you are about to stop having your period (premenopausal) and you may become pregnant, seek counseling before you get pregnant. Take 400 to 800 micrograms (mcg) of folic acid every day if you become pregnant. Ask for birth control (contraception) if you want to prevent pregnancy. Osteoporosis and menopause Osteoporosis is a disease in which the bones lose minerals and strength with aging. This can result in bone fractures. If you are 24 years old or older, or if you are at risk for osteoporosis and fractures,  ask your health care provider if you should: Be screened for bone loss. Take a calcium or vitamin D supplement to lower your risk of fractures. Be given hormone replacement therapy (HRT) to treat symptoms of menopause. Follow these instructions at home: Alcohol use Do not drink alcohol if: Your health care provider tells you not to drink. You are pregnant, may be pregnant, or are planning to become pregnant. If you drink alcohol: Limit how much you have to: 0-1 drink a day. Know how much alcohol is in your drink. In the [place] one drink equals one 12 oz bottle of beer (355 mL), one 5 oz glass of wine (148 mL), or one 1 oz glass of hard liquor (44 mL). Lifestyle Do not use any products that contain nicotine or tobacco. These products include cigarettes, chewing tobacco, and vaping devices, such as e-cigarettes. If you need help quitting, ask your health care provider. Do not use street drugs. Do not share needles. Ask your health  care provider for help if you need support or information about quitting drugs. General instructions Schedule regular health, dental, and eye exams. Stay current with your vaccines. Tell your health care provider if: You often feel depressed. You have ever been abused or do not feel safe at home. Summary Adopting a healthy lifestyle and getting preventive care are important in promoting health and wellness. Follow your health care provider's instructions about healthy diet, exercising, and getting tested or screened for diseases. Follow your health care provider's instructions on monitoring your cholesterol and blood pressure. This information is not intended to replace advice given to you by your health care provider. Make sure you discuss any questions you have with your health care provider. Document Revised: 09/23/2020 Document Reviewed: 09/23/2020 Elsevier Patient Education  [employer]

## 2022-08-12 DIAGNOSIS — M961 Postlaminectomy syndrome, not elsewhere classified: Secondary | ICD-10-CM | POA: Diagnosis not present

## 2022-08-28 ENCOUNTER — Ambulatory Visit (HOSPITAL_COMMUNITY): Payer: Medicare HMO | Attending: Family Medicine

## 2022-08-28 ENCOUNTER — Encounter: Payer: Self-pay | Admitting: Family Medicine

## 2022-08-28 DIAGNOSIS — R0600 Dyspnea, unspecified: Secondary | ICD-10-CM | POA: Diagnosis not present

## 2022-08-28 LAB — ECHOCARDIOGRAM COMPLETE
Area-P 1/2: 4.42 cm2
S' Lateral: 2.1 cm

## 2022-08-31 NOTE — Telephone Encounter (Signed)
Let's have her come in for follow up.  Thanks!

## 2022-09-04 ENCOUNTER — Ambulatory Visit (INDEPENDENT_AMBULATORY_CARE_PROVIDER_SITE_OTHER): Payer: Medicare HMO | Admitting: Family Medicine

## 2022-09-04 VITALS — BP 129/82 | HR 94 | Ht 63.0 in | Wt 150.0 lb

## 2022-09-04 DIAGNOSIS — M542 Cervicalgia: Secondary | ICD-10-CM

## 2022-09-04 DIAGNOSIS — R42 Dizziness and giddiness: Secondary | ICD-10-CM

## 2022-09-04 DIAGNOSIS — R0609 Other forms of dyspnea: Secondary | ICD-10-CM | POA: Diagnosis not present

## 2022-09-04 NOTE — Assessment & Plan Note (Signed)
Possibly cervicogenic in nature.  Adding PT/Vestibular rehab.

## 2022-09-04 NOTE — Progress Notes (Signed)
Katrina Fernandez - 57 y.o. female MRN [id number]  Date of birth: Jun 08, 1965  Subjective Chief Complaint  Patient presents with   Follow-up    HPI Katrina Fernandez is a 56 y.o. here today for follow up of continued dyspnea.    Recent echo with mild diastolic dysfunction.  She reports that she is continuing to have intermittent episodes of dyspnea.  She is also having dizziness and lightheaded symptoms.  Previous event monitor did not show arrhythmias.  She is feeling tightness in her neck especially when turning to the R and looking up.  She does feel more dizziness when looking up.  Denies numbness/tingling into the arm.    ROS:  A comprehensive ROS was completed and negative except as noted per HPI  Allergies  Allergen Reactions   Penicillins Anaphylaxis    11/28/20 - patient tolerated Keflex w/o adverse effects    Tape Rash    Plastic tape, silicone tape, Steri Strips   Bactrim [Sulfamethoxazole-Trimethoprim] Nausea Only   Paxlovid [Nirmatrelvir-Ritonavir] Nausea And Vomiting   Chlorhexidine Rash and Other (See Comments)    Dermatitis - Allergic    Past Medical History:  Diagnosis Date   Anemia    Anxiety    Arthritis    "back and knees" (07/23/2016)   Chiari malformation type I dx'd 2014   Chronic back pain    buldging, DDD; "neck and lower back" (07/23/2016)   Chronic constipation    takes Miralax daily   Chronic pain syndrome    takes Methadone and Keppra daily   Depression    takes Effexor daily   Endometriosis    1995   Fibromyalgia    GERD (gastroesophageal reflux disease)    not taking anything at the present time   Headache    History of DVT of lower extremity 07/2012   LEFT LEG --  3/ 2014.Was on a blood transfusion for 6 months   History of kidney stones    History of MRSA infection 10 yrs ago   Hyperlipidemia    takes Simvastatin daily   Migraine    "daily recently" (07/23/2016)   Muscle spasm    takes Zanaflex nightly   Peripheral edema    takes  Lasix daily as needed   Peripheral neuropathy    Pleurisy 2000   Pneumonia    hx of-7-8 yrs ago   PONV (postoperative nausea and vomiting)    Seasonal allergies    takes Claritin daily   Spinal headache    "since 2002; still get them; try to manage it w/lying flat"    Past Surgical History:  Procedure Laterality Date   ANTERIOR CERVICAL DECOMP/DISCECTOMY FUSION  07/25/2009   C5 -- C6   APPENDECTOMY  age 75   APPLICATION OF CRANIAL NAVIGATION N/A 06/23/2017   Procedure: APPLICATION OF CRANIAL NAVIGATION;  Surgeon: Donalee Citrin, MD;  Location: [facility] OR;  Service: Neurosurgery;  Laterality: N/A;   BACK SURGERY     BRAIN SURGERY     VP shunt   BREAST REDUCTION SURGERY Bilateral 06/07/2013   Procedure: BILATERAL MAMMARY REDUCTION  (BREAST);  Surgeon: Wayland Denis, DO;  Location: [facility];  Service: Plastics;  Laterality: Bilateral;   BREAST REDUCTION SURGERY Bilateral 06/07/2013   Procedure:  LIPOSUCTION;  Surgeon: Wayland Denis, DO;  Location: [facility];  Service: Plastics;  Laterality: Bilateral;   CARPAL TUNNEL RELEASE Bilateral 2001   COLONOSCOPY  X 2   "polyps were benign"   CYSTOSCOPY WITH  RETROGRADE PYELOGRAM, URETEROSCOPY AND STENT PLACEMENT Left 05/2021   (in my chart)   DILATION AND CURETTAGE OF UTERUS     ESOPHAGOGASTRODUODENOSCOPY     gastric banding undone  11/2014   LAPAROSCOPIC CHOLECYSTECTOMY  ~ 2000   LAPAROSCOPIC GASTRIC BANDING  2011   LAPAROSCOPIC REVISION VENTRICULAR-PERITONEAL (V-P) SHUNT N/A 06/23/2017   Procedure: Shunt Placment stereotactic ventricular catheter placement and general surgery abdominal exposure  (Laparoscopic vs. Open) Brain Lab;  Surgeon: Berna Bue, MD;  Location: [facility] OR;  Service: General;  Laterality: N/A;   LAPAROSCOPY N/A 06/23/2017   Procedure: LAPAROSCOPY DIAGNOSTIC;  Surgeon: Berna Bue, MD;  Location: [facility] OR;  Service: General;  Laterality: N/A;   LUMBAR LAMINECTOMY/DECOMPRESSION  MICRODISCECTOMY N/A 07/23/2016   Procedure: Decompressive Lumbar Laminectomy Lumbar two-three  for Repair of  Cerberal Spinal Fluid  Leak;  Surgeon: Donalee Citrin, MD;  Location: [facility] OR;  Service: Neurosurgery;  Laterality: N/A;   LUMBAR LAMINECTOMY/DECOMPRESSION MICRODISCECTOMY Left 03/01/2019   Procedure: LEFT LUMBAR THREE-FOUR EXTRAFORAMINAL MICRODISCECTOMY;  Surgeon: Donalee Citrin, MD;  Location: [facility] OR;  Service: Neurosurgery;  Laterality: Left;   PLACEMENT LUMBOPERITONEAL SHUNT FOR CEREBROSPINAL FLUID DIVERSION AWAY FROM C8 PERINEURAL CYST  09-05-2003  &  12-24-2003   08-31-2003  REMOVAL SHUNT   POSTERIOR CERVICAL LAMINECTOMY  03/01/2001   C7-8 and Exploration C8 perineural cyst   POSTERIOR LUMBAR LAMINECTOMY L4-5/  DISKECTOMY L5-S1  05/20/2005   RE-DO DECOMPRESSION LAMINECTOMY AND FUSION L4-5  &  L5-S1  12/20/2006   02-04-2007--  RE-EXPLORATION L4 to S1, I & D LUMBAR WOUND HEMATOMA   REDUCTION MAMMAPLASTY     SHUNT REMOVAL N/A 07/10/2016   Procedure: SHUNT REMOVAL;  Surgeon: Donalee Citrin, MD;  Location: [facility] OR;  Service: Neurosurgery;  Laterality: N/A;  SHUNT REMOVAL   SPINAL CORD STIMULATOR INSERTION N/A 04/26/2015   Procedure: LUMBAR SPINAL CORD STIMULATOR INSERTION;  Surgeon: Odette Fraction, MD;  Location: [facility];  Service: Neurosurgery;  Laterality: N/A;  LUMBAR SPINAL CORD STIMULATOR INSERTION   SPINAL CORD STIMULATOR REMOVAL N/A 06/28/2019   Procedure: Removal of dorsal column stimulator and generator -;  Surgeon: Donalee Citrin, MD;  Location: [facility] OR;  Service: Neurosurgery;  Laterality: N/A;   TENNIS ELBOW RELEASE/NIRSCHEL PROCEDURE Left 12/13/2014   Procedure: LEFT ELBOW LATERAL EPICONDYLAR DEBRIDEMENT AND OR REPAIR -RECONSTRUCTION ;  Surgeon: Bradly Bienenstock, MD;  Location: [facility];  Service: Orthopedics;  Laterality: Left;   TOTAL ABDOMINAL HYSTERECTOMY  1994   w/  Bilateral Salpinoophorectomy   VENTRICULOPERITONEAL SHUNT N/A 06/23/2017   Procedure: VENTRICULAR-PERITONEAL  SHUNT INSERTION WITH ABDOMINAL LAPARASCOPY AND BRAINLAB NAVIGATION;  Surgeon: Donalee Citrin, MD;  Location: [facility] OR;  Service: Neurosurgery;  Laterality: N/A;    Social History   Socioeconomic History   Marital status: Divorced    Spouse name: Not on file   Number of children: 2   Years of education: 14   Highest education level: Associate degree: academic program  Occupational History   Occupation: Disabled   Tobacco Use   Smoking status: Never   Smokeless tobacco: Never  Building services engineer Use: Never used  Substance and Sexual Activity   Alcohol use: Yes    Comment: once a year   Drug use: No   Sexual activity: Not Currently    Birth control/protection: Surgical  Other Topics Concern   Not on file  Social History Narrative   Mrs Pospisil is divorced, on permanent disability and lives at home with her mother and her  daughter. She is independent in her care needs. She denies concern with transportation to medical appointments She is on a fixed income. She enjoys crafting and quilting.   Social Determinants of Health   Financial Resource Strain: Low Risk  (09/04/2022)   Overall Financial Resource Strain (CARDIA)    Difficulty of Paying Living Expenses: Not very hard  Food Insecurity: No Food Insecurity (09/04/2022)   Hunger Vital Sign    Worried About Running Out of Food in the Last Year: Never true    Ran Out of Food in the Last Year: Never true  Transportation Needs: No Transportation Needs (09/04/2022)   PRAPARE - Administrator, Civil Service (Medical): No    Lack of Transportation (Non-Medical): No  Physical Activity: Inactive (09/04/2022)   Exercise Vital Sign    Days of Exercise per Week: 0 days    Minutes of Exercise per Session: 0 min  Stress: No Stress Concern Present (09/04/2022)   [employer] of Occupational Health - Occupational Stress Questionnaire    Feeling of Stress : Only a little  Recent Concern: Stress - Stress Concern Present (08/03/2022)    [employer] of Occupational Health - Occupational Stress Questionnaire    Feeling of Stress : To some extent  Social Connections: Unknown (09/04/2022)   Social Connection and Isolation Panel [NHANES]    Frequency of Communication with Friends and Family: More than three times a week    Frequency of Social Gatherings with Friends and Family: Twice a week    Attends Religious Services: Patient declined    Database administrator or Organizations: Yes    Attends Banker Meetings: 1 to 4 times per year    Marital Status: Divorced  Recent Concern: Social Connections - Moderately Isolated (08/03/2022)   Social Connection and Isolation Panel [NHANES]    Frequency of Communication with Friends and Family: Three times a week    Frequency of Social Gatherings with Friends and Family: More than three times a week    Attends Religious Services: Never    Database administrator or Organizations: Yes    Attends Engineer, structural: More than 4 times per year    Marital Status: Divorced    Family History  Problem Relation Age of Onset   COPD Mother    Diabetes Mother    Asthma Mother    Diabetes Father    Colon cancer Father    Ovarian cancer Maternal Grandmother    Pancreatic cancer Maternal Grandfather    Depression Other     Health Maintenance  Topic Date Due   DTaP/Tdap/Td (2 - Td or Tdap) 05/19/2019   Zoster Vaccines- Shingrix (2 of 2) 11/03/2022 (Originally 04/02/2022)   COVID-19 Vaccine (3 - Moderna risk series) 02/20/2023 (Originally 12/05/2019)   Hepatitis C Screening  08/03/2023 (Originally 08/23/1985)   INFLUENZA VACCINE  12/17/2022   COLONOSCOPY (Pts 45-34yrs Insurance coverage will need to be confirmed)  07/05/2023   Medicare Annual Wellness (AWV)  08/03/2023   MAMMOGRAM  08/29/2023   HIV Screening  Completed   HPV VACCINES  Aged Out      ----------------------------------------------------------------------------------------------------------------------------------------------------------------------------------------------------------------- Physical Exam BP 129/82 (BP Location: Left Arm, Patient Position: Sitting, Cuff Size: Normal)   Pulse 94   Ht 5\' 3"  (1.6 m)   Wt 150 lb (68 kg)   SpO2 98%   BMI 26.57 kg/m   Physical Exam Constitutional:      Appearance: Normal appearance.  HENT:  Head: Normocephalic and atraumatic.  Eyes:     General: No scleral icterus. Neck:     Comments: Tightness in upper trapezius and paraspinals on the L.  Pain worse with R rotation and full extension.  Slight dizziness with extension.  Cardiovascular:     Rate and Rhythm: Normal rate and regular rhythm.  Pulmonary:     Effort: Pulmonary effort is normal.     Breath sounds: Normal breath sounds.  Neurological:     Mental Status: She is alert.     ------------------------------------------------------------------------------------------------------------------------------------------------------------------------------------------------------------------- Assessment and Plan  Dizziness Possibly cervicogenic in nature.  Adding PT/Vestibular rehab.   Chronic dyspnea Mild diastolic dysfunction on Echo.  HRCT of the chest ordered.  Referral to cardiology as well given exertional component.     No orders of the defined types were placed in this encounter.   No follow-ups on file.    This visit occurred during the SARS-CoV-2 public health emergency.  Safety protocols were in place, including screening questions prior to the visit, additional usage of staff PPE, and extensive cleaning of exam room while observing appropriate contact time as indicated for disinfecting solutions.

## 2022-09-04 NOTE — Assessment & Plan Note (Signed)
Mild diastolic dysfunction on Echo.  HRCT of the chest ordered.  Referral to cardiology as well given exertional component.

## 2022-09-07 ENCOUNTER — Other Ambulatory Visit: Payer: Self-pay | Admitting: Family Medicine

## 2022-09-16 ENCOUNTER — Other Ambulatory Visit: Payer: Self-pay

## 2022-09-16 ENCOUNTER — Encounter: Payer: Self-pay | Admitting: Physical Therapy

## 2022-09-16 ENCOUNTER — Ambulatory Visit: Payer: Medicare HMO | Attending: Family Medicine | Admitting: Physical Therapy

## 2022-09-16 ENCOUNTER — Ambulatory Visit (INDEPENDENT_AMBULATORY_CARE_PROVIDER_SITE_OTHER): Payer: Medicare HMO

## 2022-09-16 DIAGNOSIS — M62838 Other muscle spasm: Secondary | ICD-10-CM | POA: Insufficient documentation

## 2022-09-16 DIAGNOSIS — J984 Other disorders of lung: Secondary | ICD-10-CM | POA: Diagnosis not present

## 2022-09-16 DIAGNOSIS — M542 Cervicalgia: Secondary | ICD-10-CM | POA: Diagnosis not present

## 2022-09-16 DIAGNOSIS — R42 Dizziness and giddiness: Secondary | ICD-10-CM | POA: Insufficient documentation

## 2022-09-16 DIAGNOSIS — R293 Abnormal posture: Secondary | ICD-10-CM | POA: Diagnosis not present

## 2022-09-16 DIAGNOSIS — R0609 Other forms of dyspnea: Secondary | ICD-10-CM

## 2022-09-16 NOTE — Therapy (Signed)
OUTPATIENT PHYSICAL THERAPY CERVICAL EVALUATION   Patient Name: ADREANNE YONO MRN: [id number] DOB:14-Aug-1965, 57 y.o., female Today's Date: 09/16/2022  END OF SESSION:  PT End of Session - 09/16/22 0852     Visit Number 1    Number of Visits 12    Date for PT Re-Evaluation 10/28/22    Authorization Type [employer]    Progress Note Due on Visit 10    PT Start Time 0850    PT Stop Time 0930    PT Time Calculation (min) 40 min    Activity Tolerance Patient tolerated treatment well    Behavior During Therapy [facility] for tasks assessed/performed             Past Medical History:  Diagnosis Date   Anemia    Anxiety    Arthritis    "back and knees" (07/23/2016)   Chiari malformation type I (HCC) dx'd 2014   Chronic back pain    buldging, DDD; "neck and lower back" (07/23/2016)   Chronic constipation    takes Miralax daily   Chronic pain syndrome    takes Methadone and Keppra daily   Depression    takes Effexor daily   Endometriosis    1995   Fibromyalgia    GERD (gastroesophageal reflux disease)    not taking anything at the present time   Headache    History of DVT of lower extremity 07/2012   LEFT LEG --  3/ 2014.Was on a blood transfusion for 6 months   History of kidney stones    History of MRSA infection 10 yrs ago   Hyperlipidemia    takes Simvastatin daily   Migraine    "daily recently" (07/23/2016)   Muscle spasm    takes Zanaflex nightly   Peripheral edema    takes Lasix daily as needed   Peripheral neuropathy    Pleurisy 2000   Pneumonia    hx of-7-8 yrs ago   PONV (postoperative nausea and vomiting)    Seasonal allergies    takes Claritin daily   Spinal headache    "since 2002; still get them; try to manage it w/lying flat"   Past Surgical History:  Procedure Laterality Date   ANTERIOR CERVICAL DECOMP/DISCECTOMY FUSION  07/25/2009   C5 -- C6   APPENDECTOMY  age 28   APPLICATION OF CRANIAL NAVIGATION N/A 06/23/2017   Procedure: APPLICATION  OF CRANIAL NAVIGATION;  Surgeon: Donalee Citrin, MD;  Location: [facility] OR;  Service: Neurosurgery;  Laterality: N/A;   BACK SURGERY     BRAIN SURGERY     VP shunt   BREAST REDUCTION SURGERY Bilateral 06/07/2013   Procedure: BILATERAL MAMMARY REDUCTION  (BREAST);  Surgeon: Wayland Denis, DO;  Location: [facility];  Service: Plastics;  Laterality: Bilateral;   BREAST REDUCTION SURGERY Bilateral 06/07/2013   Procedure:  LIPOSUCTION;  Surgeon: Wayland Denis, DO;  Location: [facility];  Service: Plastics;  Laterality: Bilateral;   CARPAL TUNNEL RELEASE Bilateral 2001   COLONOSCOPY  X 2   "polyps were benign"   CYSTOSCOPY WITH RETROGRADE PYELOGRAM, URETEROSCOPY AND STENT PLACEMENT Left 05/2021   (in my chart)   DILATION AND CURETTAGE OF UTERUS     ESOPHAGOGASTRODUODENOSCOPY     gastric banding undone  11/2014   LAPAROSCOPIC CHOLECYSTECTOMY  ~ 2000   LAPAROSCOPIC GASTRIC BANDING  2011   LAPAROSCOPIC REVISION VENTRICULAR-PERITONEAL (V-P) SHUNT N/A 06/23/2017   Procedure: Shunt Placment stereotactic ventricular catheter placement and general surgery abdominal exposure  (  Laparoscopic vs. Open) Brain Lab;  Surgeon: Berna Bue, MD;  Location: [facility] OR;  Service: General;  Laterality: N/A;   LAPAROSCOPY N/A 06/23/2017   Procedure: LAPAROSCOPY DIAGNOSTIC;  Surgeon: Berna Bue, MD;  Location: [facility] OR;  Service: General;  Laterality: N/A;   LUMBAR LAMINECTOMY/DECOMPRESSION MICRODISCECTOMY N/A 07/23/2016   Procedure: Decompressive Lumbar Laminectomy Lumbar two-three  for Repair of  Cerberal Spinal Fluid  Leak;  Surgeon: Donalee Citrin, MD;  Location: [facility] OR;  Service: Neurosurgery;  Laterality: N/A;   LUMBAR LAMINECTOMY/DECOMPRESSION MICRODISCECTOMY Left 03/01/2019   Procedure: LEFT LUMBAR THREE-FOUR EXTRAFORAMINAL MICRODISCECTOMY;  Surgeon: Donalee Citrin, MD;  Location: [facility] OR;  Service: Neurosurgery;  Laterality: Left;   PLACEMENT LUMBOPERITONEAL SHUNT FOR CEREBROSPINAL FLUID  DIVERSION AWAY FROM C8 PERINEURAL CYST  09-05-2003  &  12-24-2003   08-31-2003  REMOVAL SHUNT   POSTERIOR CERVICAL LAMINECTOMY  03/01/2001   C7-8 and Exploration C8 perineural cyst   POSTERIOR LUMBAR LAMINECTOMY L4-5/  DISKECTOMY L5-S1  05/20/2005   RE-DO DECOMPRESSION LAMINECTOMY AND FUSION L4-5  &  L5-S1  12/20/2006   02-04-2007--  RE-EXPLORATION L4 to S1, I & D LUMBAR WOUND HEMATOMA   REDUCTION MAMMAPLASTY     SHUNT REMOVAL N/A 07/10/2016   Procedure: SHUNT REMOVAL;  Surgeon: Donalee Citrin, MD;  Location: [facility] OR;  Service: Neurosurgery;  Laterality: N/A;  SHUNT REMOVAL   SPINAL CORD STIMULATOR INSERTION N/A 04/26/2015   Procedure: LUMBAR SPINAL CORD STIMULATOR INSERTION;  Surgeon: Odette Fraction, MD;  Location: [facility];  Service: Neurosurgery;  Laterality: N/A;  LUMBAR SPINAL CORD STIMULATOR INSERTION   SPINAL CORD STIMULATOR REMOVAL N/A 06/28/2019   Procedure: Removal of dorsal column stimulator and generator -;  Surgeon: Donalee Citrin, MD;  Location: [facility] OR;  Service: Neurosurgery;  Laterality: N/A;   TENNIS ELBOW RELEASE/NIRSCHEL PROCEDURE Left 12/13/2014   Procedure: LEFT ELBOW LATERAL EPICONDYLAR DEBRIDEMENT AND OR REPAIR -RECONSTRUCTION ;  Surgeon: Bradly Bienenstock, MD;  Location: [facility];  Service: Orthopedics;  Laterality: Left;   TOTAL ABDOMINAL HYSTERECTOMY  1994   w/  Bilateral Salpinoophorectomy   VENTRICULOPERITONEAL SHUNT N/A 06/23/2017   Procedure: VENTRICULAR-PERITONEAL SHUNT INSERTION WITH ABDOMINAL LAPARASCOPY AND BRAINLAB NAVIGATION;  Surgeon: Donalee Citrin, MD;  Location: [facility] OR;  Service: Neurosurgery;  Laterality: N/A;   Patient Active Problem List   Diagnosis Date Noted   Chronic dyspnea 09/04/2022   Chest pain on breathing 04/23/2022   Dizziness 02/05/2022   AKI (acute kidney injury) (HCC) 06/22/2021   Occipital neuralgia of right side 05/01/2021   Sinobronchitis 03/07/2021   Eczema 02/19/2021   Flank pain with history of urolithiasis 11/29/2020   Right  flank pain 10/16/2020   Shingles 09/02/2020   URI (upper respiratory infection) 08/21/2020   Elevated blood pressure reading 02/13/2020   Abnormal urinalysis 02/13/2020   Nephrolithiasis 02/08/2020   Hematuria 11/26/2019   Other fatigue 11/26/2019   Bronchitis 09/12/2019   Syncopal episodes 07/26/2019   COVID-19 07/26/2019   Viral illness 07/19/2019   HNP (herniated nucleus pulposus), lumbar 03/01/2019   Cubital tunnel syndrome on left 03/17/2018   Plantar fasciitis of left foot 03/17/2018   CSF leak 06/23/2017   Trochanteric bursitis, left hip 06/09/2017   Pseudotumor cerebri 03/31/2017   Obesity 09/15/2016   Abnormal weight gain 09/03/2016   Chiari I malformation (HCC) 07/09/2016   Prediabetes 02/21/2016   RLS (restless legs syndrome) 10/23/2015   Seasonal allergies 10/23/2015   Hyperlipidemia 07/17/2015   Osteoarthritis of carpometacarpal joint of left thumb 06/28/2015  Fibromyalgia 05/30/2015   Chronic back pain 05/09/2015   Therapeutic opioid-induced constipation (OIC) 01/31/2015   Insomnia 07/09/2014   Generalized anxiety disorder 04/03/2014   Spider angioma 10/11/2013   Chronic venous insufficiency 09/25/2013   Symptomatic mammary hypertrophy 05/30/2013   Nevus, non-neoplastic 08/01/2012   Pain syndrome, chronic 06/20/2012   Postlaminectomy syndrome of lumbar region 06/20/2012   Primary osteoarthritis of both knees 02/08/2012   MDD (major depressive disorder) 01/28/2012   Subluxation of peroneal tendon of right foot 01/28/2012   GERD (gastroesophageal reflux disease) 01/28/2012   Complication of gastric band procedure 08/26/2011    PCP: Everrett Coombe, DO  REFERRING PROVIDER: Everrett Coombe, DO  REFERRING DIAG: M54.2 (ICD-10-CM) - Neck pain R42 (ICD-10-CM) - Dizziness  THERAPY DIAG:  Dizziness and giddiness  Abnormal posture  Cervicalgia  Other muscle spasm  Rationale for Evaluation and Treatment: Rehabilitation  ONSET DATE: ~6  months  SUBJECTIVE:                                                                                                                                                                                                         SUBJECTIVE STATEMENT: Pt states she has been having SHOB and dizziness. It has progressively worsened. Is getting a lung CT today. Pt is getting sent to cardiology as well. Pt reports she can be sitting at home doing nothing and can feel dizzy. Feels it is "whoozy." Pt states when she transfers from the ground to standing (like in the yard) she can feel dizzy. Pt states it sometimes affects her balance. Pt reports the R anterior neck is very tender and has a shunt on the side. Has chronic neck tightness. Does not note any notable dizziness with head turns. Pt states symptoms have been a gradual progression. Reports increased SHOB and effort (I.e. diaphoretic and "my face is beet red") with house work. Hand dominance: Right  PERTINENT HISTORY:  Pt states her neck and back are fused; history of cyst in her spine, shunt  PAIN:  Are you having pain? Yes: NPRS scale: 1-2 currently (baseline)/10 Pain location: Neck Pain description: Stiffness/ache/dull Aggravating factors: Constant and chronic Relieving factors: Ice  PRECAUTIONS: Fall  WEIGHT BEARING RESTRICTIONS: No  FALLS:  Has patient fallen in last 6 months? No  LIVING ENVIRONMENT: Lives with: lives with their family daughter (19 y/o) and mom Lives in: House/apartment Stairs: No Has following equipment at home: None  OCCUPATION: On disability; likes to ride bike and work out in the yard  PLOF: Independent  PATIENT GOALS: Improve dizziness  NEXT MD  VISIT: CT scan today  OBJECTIVE:   DIAGNOSTIC FINDINGS:  CXR 07/21/22: (-) CT cervical 10/18/21 IMPRESSION: 1. No acute osseous abnormality or evidence of significant degenerative spondylosis of the cervical spine. 2. Prior C5-6 ACDF with solid interbody fusion. 3.  Unchanged congenital findings spinal dysraphism. Grossly stable right C7 perineural root sleeve cyst.  PATIENT SURVEYS:  DHI 34%  COGNITION: Overall cognitive status: Within functional limits for tasks assessed  SENSATION: WFL  POSTURE: rounded shoulders and forward head  PALPATION: TTP R>L suboccipitals, R anterior scalene, bilat UTs   CERVICAL ROM:   Active ROM A/PROM (deg) eval  Flexion 30  Extension 50 "feels weird"  Right lateral flexion 40  Left lateral flexion 30  Right rotation 40  Left rotation 52   (Blank rows = not tested)  UPPER EXTREMITY ROM: WFL grossly  UPPER EXTREMITY MMT: WFL grossly  CERVICAL SPECIAL TESTS:  Neck flexor muscle endurance test: Positive Vertebral artery test: Need to check  FUNCTIONAL TESTS:  Did not assess  VESTIBULAR ASSESSMENT:  GENERAL OBSERVATION: See above   SYMPTOM BEHAVIOR:  Subjective history: See above  Non-Vestibular symptoms: changes in vision, neck pain, and headaches  Type of dizziness: "Funny feeling in the head" and "Swimmyheaded"  Frequency: Daily; multiple times during the day (can be up to 10 times or more)  Duration: Short/brief  Aggravating factors: Spontaneous and Induced by position change: floor to standing  Relieving factors: rest and slow movements  Progression of symptoms: worse  OCULOMOTOR EXAM:  Ocular Alignment: normal  Ocular ROM: No Limitations  Spontaneous Nystagmus: absent  Gaze-Induced Nystagmus:  WFL  Smooth Pursuits:  mild corrective saccades with R inferior gaze but other no nystagmus  Saccades:  Mild symptoms with horizontal saccades  Convergence/Divergence: did not assess  Head shaking: WFL  FRENZEL - FIXATION SUPRESSED: Unable to test  VESTIBULAR - OCULAR REFLEX:   Slow VOR: Positive Right  VOR Cancellation: Comment: Mild symptoms with R head turn  Head-Impulse Test: HIT Right: negative HIT Left: negative  Dynamic Visual Acuity:  TBA  Fast VOR: No symptoms or issues  horizontal or vertical   POSITIONAL TESTING: Did not assess   MOTION SENSITIVITY:    Motion Sensitivity Quotient Intensity: 0 = none, 1 = Lightheaded, 2 = Mild, 3 = Moderate, 4 = Severe, 5 = Vomiting  Intensity  1. Sitting to supine   2. Supine to L side   3. Supine to R side   4. Supine to sitting   5. L Hallpike-Dix   6. Up from L    7. R Hallpike-Dix   8. Up from R    9. Sitting, head  tipped to L knee   10. Head up from L  knee   11. Sitting, head  tipped to R knee   12. Head up from R  knee   13. Sitting head turns x5   14.Sitting head nods x5   15. In stance, 180  turn to L    16. In stance, 180  turn to R     OTHOSTATICS:  After 5 min in supine: 134/88; 84 BPM Initial Sitting: 144/99; 91 BPM After 5 min in sitting: 128/96; 88 BPM Initial Standing: 131/97; 98 BPM After 5 min standing: 130/97; 106 BPM  FUNCTIONAL GAIT:  FGA: TBA    TODAY'S TREATMENT:  DATE: 09/16/22 Did not initiate   PATIENT EDUCATION:  Education details: Exam findings, POC Person educated: Patient Education method: Explanation and Demonstration Education comprehension: verbalized understanding and needs further education  HOME EXERCISE PROGRAM: Will initiate next session  ASSESSMENT:  CLINICAL IMPRESSION: Patient is a 57 y.o. F who was seen today for physical therapy evaluation and treatment for neck pain and dizziness. Pt with chronic neck pain/stiffness. PMH significant for ACDF C5-6, cyst along C7 and a shunt (along R side of neck). Oculomotor movements appear grossly WFL; however, mild saccades notable with end range of R inferior gaze. Symptoms replicated during slow VOR (pt utilizing full neck rotation). Pt demos decreased neck ROM and tender suboccipitals which may be contributing to her dizzy symptoms. BP appears WFL but pt's HR appears to increase greatly  upon standing. Pt will be getting cardio work up in the next 2-3 weeks. PT diagnosis is difficult to establish due to possible cardiac factors and possible effects of her shunt/chronic neck issues; however, discussed with pt trial of PT since we were able to replicate some of her dizziness today and there may be a cervical muscle component.   OBJECTIVE IMPAIRMENTS: decreased activity tolerance, decreased endurance, decreased ROM, dizziness, increased fascial restrictions, increased muscle spasms, postural dysfunction, and pain.   ACTIVITY LIMITATIONS: bending, squatting, transfers, and locomotion level  PARTICIPATION LIMITATIONS: community activity and yard work  PERSONAL FACTORS: Age, Fitness, Past/current experiences, and Time since onset of injury/illness/exacerbation, PMH including ACDF, cervical cyst and shunt are also affecting patient's functional outcome.   REHAB POTENTIAL: Good  CLINICAL DECISION MAKING: Evolving/moderate complexity  EVALUATION COMPLEXITY: Moderate   GOALS: Goals reviewed with patient? Yes  SHORT TERM GOALS: Target date: 09/30/2022   Pt will be ind with initial HEP Baseline:  Goal status: INITIAL  2.  Pt will report >/=25% decrease in frequency or intensity of her symptoms Baseline:  Goal status: INITIAL   LONG TERM GOALS: Target date: 10/28/2022   Pt will be ind with management and progression of HEP Baseline:  Goal status: INITIAL  2.  Pt will have L = R cervical motion Baseline:  Goal status: INITIAL  3.  Pt will report >/=50% decrease in overall symptoms to be able to perform yard work Baseline:  Goal status: INITIAL  4.  Pt will have improved DHI to </=16% to demo MCID Baseline:  Goal status: INITIAL    PLAN:  PT FREQUENCY: 2x/week  PT DURATION: 6 weeks  PLANNED INTERVENTIONS: Therapeutic exercises, Therapeutic activity, Neuromuscular re-education, Balance training, Gait training, Patient/Family education, Self Care, Joint  mobilization, Stair training, Vestibular training, Canalith repositioning, Aquatic Therapy, Dry Needling, Electrical stimulation, Cryotherapy, Moist heat, Taping, Ultrasound, Ionotophoresis 4mg /ml Dexamethasone, Manual therapy, and Re-evaluation  PLAN FOR NEXT SESSION: Assess vertebral artery. Monitor BP. Work on cervical ROM/manual work for trigger point release. Deep neck flexor strengthening. Gaze stabilization.    Sanders Manninen April Ma L Trayden Brandy, PT 09/16/2022, 10:05 AM

## 2022-09-18 ENCOUNTER — Other Ambulatory Visit: Payer: Self-pay | Admitting: Family Medicine

## 2022-09-18 DIAGNOSIS — E7849 Other hyperlipidemia: Secondary | ICD-10-CM

## 2022-09-19 ENCOUNTER — Other Ambulatory Visit: Payer: Self-pay | Admitting: Family Medicine

## 2022-09-21 ENCOUNTER — Ambulatory Visit: Payer: Medicare HMO

## 2022-09-21 DIAGNOSIS — M542 Cervicalgia: Secondary | ICD-10-CM | POA: Diagnosis not present

## 2022-09-21 DIAGNOSIS — M62838 Other muscle spasm: Secondary | ICD-10-CM

## 2022-09-21 DIAGNOSIS — R293 Abnormal posture: Secondary | ICD-10-CM | POA: Diagnosis not present

## 2022-09-21 DIAGNOSIS — R42 Dizziness and giddiness: Secondary | ICD-10-CM

## 2022-09-21 NOTE — Therapy (Signed)
OUTPATIENT PHYSICAL THERAPY CERVICAL TREATMENT   Patient Name: Katrina Fernandez MRN: [id number] DOB:Oct 30, 1965, 57 y.o., female Today's Date: 09/21/2022  END OF SESSION:  PT End of Session - 09/21/22 1013     Visit Number 2    Number of Visits 12    Date for PT Re-Evaluation 10/28/22    Authorization Type [employer]    Progress Note Due on Visit 10    PT Start Time 1015    PT Stop Time 1058    PT Time Calculation (min) 43 min    Activity Tolerance Patient tolerated treatment well    Behavior During Therapy WFL for tasks assessed/performed             Past Medical History:  Diagnosis Date   Anemia    Anxiety    Arthritis    "back and knees" (07/23/2016)   Chiari malformation type I (HCC) dx'd 2014   Chronic back pain    buldging, DDD; "neck and lower back" (07/23/2016)   Chronic constipation    takes Miralax daily   Chronic pain syndrome    takes Methadone and Keppra daily   Depression    takes Effexor daily   Endometriosis    1995   Fibromyalgia    GERD (gastroesophageal reflux disease)    not taking anything at the present time   Headache    History of DVT of lower extremity 07/2012   LEFT LEG --  3/ 2014.Was on a blood transfusion for 6 months   History of kidney stones    History of MRSA infection 10 yrs ago   Hyperlipidemia    takes Simvastatin daily   Migraine    "daily recently" (07/23/2016)   Muscle spasm    takes Zanaflex nightly   Peripheral edema    takes Lasix daily as needed   Peripheral neuropathy    Pleurisy 2000   Pneumonia    hx of-7-8 yrs ago   PONV (postoperative nausea and vomiting)    Seasonal allergies    takes Claritin daily   Spinal headache    "since 2002; still get them; try to manage it w/lying flat"   Past Surgical History:  Procedure Laterality Date   ANTERIOR CERVICAL DECOMP/DISCECTOMY FUSION  07/25/2009   C5 -- C6   APPENDECTOMY  age 57   APPLICATION OF CRANIAL NAVIGATION N/A 06/23/2017   Procedure: APPLICATION OF  CRANIAL NAVIGATION;  Surgeon: Donalee Citrin, MD;  Location: [facility] OR;  Service: Neurosurgery;  Laterality: N/A;   BACK SURGERY     BRAIN SURGERY     VP shunt   BREAST REDUCTION SURGERY Bilateral 06/07/2013   Procedure: BILATERAL MAMMARY REDUCTION  (BREAST);  Surgeon: Wayland Denis, DO;  Location: [facility];  Service: Plastics;  Laterality: Bilateral;   BREAST REDUCTION SURGERY Bilateral 06/07/2013   Procedure:  LIPOSUCTION;  Surgeon: Wayland Denis, DO;  Location: [facility];  Service: Plastics;  Laterality: Bilateral;   CARPAL TUNNEL RELEASE Bilateral 2001   COLONOSCOPY  X 2   "polyps were benign"   CYSTOSCOPY WITH RETROGRADE PYELOGRAM, URETEROSCOPY AND STENT PLACEMENT Left 05/2021   (in my chart)   DILATION AND CURETTAGE OF UTERUS     ESOPHAGOGASTRODUODENOSCOPY     gastric banding undone  11/2014   LAPAROSCOPIC CHOLECYSTECTOMY  ~ 2000   LAPAROSCOPIC GASTRIC BANDING  2011   LAPAROSCOPIC REVISION VENTRICULAR-PERITONEAL (V-P) SHUNT N/A 06/23/2017   Procedure: Shunt Placment stereotactic ventricular catheter placement and general surgery abdominal exposure  (  Laparoscopic vs. Open) Brain Lab;  Surgeon: Berna Bue, MD;  Location: [facility] OR;  Service: General;  Laterality: N/A;   LAPAROSCOPY N/A 06/23/2017   Procedure: LAPAROSCOPY DIAGNOSTIC;  Surgeon: Berna Bue, MD;  Location: [facility] OR;  Service: General;  Laterality: N/A;   LUMBAR LAMINECTOMY/DECOMPRESSION MICRODISCECTOMY N/A 07/23/2016   Procedure: Decompressive Lumbar Laminectomy Lumbar two-three  for Repair of  Cerberal Spinal Fluid  Leak;  Surgeon: Donalee Citrin, MD;  Location: [facility] OR;  Service: Neurosurgery;  Laterality: N/A;   LUMBAR LAMINECTOMY/DECOMPRESSION MICRODISCECTOMY Left 03/01/2019   Procedure: LEFT LUMBAR THREE-FOUR EXTRAFORAMINAL MICRODISCECTOMY;  Surgeon: Donalee Citrin, MD;  Location: [facility] OR;  Service: Neurosurgery;  Laterality: Left;   PLACEMENT LUMBOPERITONEAL SHUNT FOR CEREBROSPINAL FLUID DIVERSION  AWAY FROM C8 PERINEURAL CYST  09-05-2003  &  12-24-2003   08-31-2003  REMOVAL SHUNT   POSTERIOR CERVICAL LAMINECTOMY  03/01/2001   C7-8 and Exploration C8 perineural cyst   POSTERIOR LUMBAR LAMINECTOMY L4-5/  DISKECTOMY L5-S1  05/20/2005   RE-DO DECOMPRESSION LAMINECTOMY AND FUSION L4-5  &  L5-S1  12/20/2006   02-04-2007--  RE-EXPLORATION L4 to S1, I & D LUMBAR WOUND HEMATOMA   REDUCTION MAMMAPLASTY     SHUNT REMOVAL N/A 07/10/2016   Procedure: SHUNT REMOVAL;  Surgeon: Donalee Citrin, MD;  Location: [facility] OR;  Service: Neurosurgery;  Laterality: N/A;  SHUNT REMOVAL   SPINAL CORD STIMULATOR INSERTION N/A 04/26/2015   Procedure: LUMBAR SPINAL CORD STIMULATOR INSERTION;  Surgeon: Odette Fraction, MD;  Location: [facility];  Service: Neurosurgery;  Laterality: N/A;  LUMBAR SPINAL CORD STIMULATOR INSERTION   SPINAL CORD STIMULATOR REMOVAL N/A 06/28/2019   Procedure: Removal of dorsal column stimulator and generator -;  Surgeon: Donalee Citrin, MD;  Location: [facility] OR;  Service: Neurosurgery;  Laterality: N/A;   TENNIS ELBOW RELEASE/NIRSCHEL PROCEDURE Left 12/13/2014   Procedure: LEFT ELBOW LATERAL EPICONDYLAR DEBRIDEMENT AND OR REPAIR -RECONSTRUCTION ;  Surgeon: Bradly Bienenstock, MD;  Location: [facility];  Service: Orthopedics;  Laterality: Left;   TOTAL ABDOMINAL HYSTERECTOMY  1994   w/  Bilateral Salpinoophorectomy   VENTRICULOPERITONEAL SHUNT N/A 06/23/2017   Procedure: VENTRICULAR-PERITONEAL SHUNT INSERTION WITH ABDOMINAL LAPARASCOPY AND BRAINLAB NAVIGATION;  Surgeon: Donalee Citrin, MD;  Location: [facility] OR;  Service: Neurosurgery;  Laterality: N/A;   Patient Active Problem List   Diagnosis Date Noted   Chronic dyspnea 09/04/2022   Chest pain on breathing 04/23/2022   Dizziness 02/05/2022   AKI (acute kidney injury) (HCC) 06/22/2021   Occipital neuralgia of right side 05/01/2021   Sinobronchitis 03/07/2021   Eczema 02/19/2021   Flank pain with history of urolithiasis 11/29/2020   Right flank pain  10/16/2020   Shingles 09/02/2020   URI (upper respiratory infection) 08/21/2020   Elevated blood pressure reading 02/13/2020   Abnormal urinalysis 02/13/2020   Nephrolithiasis 02/08/2020   Hematuria 11/26/2019   Other fatigue 11/26/2019   Bronchitis 09/12/2019   Syncopal episodes 07/26/2019   COVID-19 07/26/2019   Viral illness 07/19/2019   HNP (herniated nucleus pulposus), lumbar 03/01/2019   Cubital tunnel syndrome on left 03/17/2018   Plantar fasciitis of left foot 03/17/2018   CSF leak 06/23/2017   Trochanteric bursitis, left hip 06/09/2017   Pseudotumor cerebri 03/31/2017   Obesity 09/15/2016   Abnormal weight gain 09/03/2016   Chiari I malformation (HCC) 07/09/2016   Prediabetes 02/21/2016   RLS (restless legs syndrome) 10/23/2015   Seasonal allergies 10/23/2015   Hyperlipidemia 07/17/2015   Osteoarthritis of carpometacarpal joint of left thumb 06/28/2015  Fibromyalgia 05/30/2015   Chronic back pain 05/09/2015   Therapeutic opioid-induced constipation (OIC) 01/31/2015   Insomnia 07/09/2014   Generalized anxiety disorder 04/03/2014   Spider angioma 10/11/2013   Chronic venous insufficiency 09/25/2013   Symptomatic mammary hypertrophy 05/30/2013   Nevus, non-neoplastic 08/01/2012   Pain syndrome, chronic 06/20/2012   Postlaminectomy syndrome of lumbar region 06/20/2012   Primary osteoarthritis of both knees 02/08/2012   MDD (major depressive disorder) 01/28/2012   Subluxation of peroneal tendon of right foot 01/28/2012   GERD (gastroesophageal reflux disease) 01/28/2012   Complication of gastric band procedure 08/26/2011    PCP: Everrett Coombe, DO  REFERRING PROVIDER: Everrett Coombe, DO  REFERRING DIAG: M54.2 (ICD-10-CM) - Neck pain R42 (ICD-10-CM) - Dizziness  THERAPY DIAG:  Dizziness and giddiness  Abnormal posture  Cervicalgia  Other muscle spasm  Rationale for Evaluation and Treatment: Rehabilitation  ONSET DATE: ~6 months  SUBJECTIVE:                                                                                                                                                                                                          SUBJECTIVE STATEMENT: Patient reports she had an echo cardiogram performed and results showed mild diastolic dysfunction with impaired relaxation; lung CT scan came back clear. Patient reports she is feeling okay though she hasn't been up for very long. Patient states she was quilting last night and she became woozy. Patient states she had a migraine after manual treatment from last PT session.  Hand dominance: Right  PERTINENT HISTORY:  Pt states her neck and back are fused; history of cyst in her spine, shunt  PAIN:  Are you having pain? Yes: NPRS scale: 1-2 currently (baseline)/10 Pain location: Neck Pain description: Stiffness/ache/dull Aggravating factors: Constant and chronic Relieving factors: Ice  PRECAUTIONS: Fall  WEIGHT BEARING RESTRICTIONS: No  FALLS:  Has patient fallen in last 6 months? No  LIVING ENVIRONMENT: Lives with: lives with their family daughter (67 y/o) and mom Lives in: House/apartment Stairs: No Has following equipment at home: None  OCCUPATION: On disability; likes to ride bike and work out in the yard  PLOF: Independent  PATIENT GOALS: Improve dizziness  NEXT MD VISIT: Cardiologist 10/06/22  OBJECTIVE:   DIAGNOSTIC FINDINGS:  CXR 07/21/22: (-) CT cervical 10/18/21 IMPRESSION: 1. No acute osseous abnormality or evidence of significant degenerative spondylosis of the cervical spine. 2. Prior C5-6 ACDF with solid interbody fusion. 3. Unchanged congenital findings spinal dysraphism. Grossly stable right C7 perineural root sleeve cyst.  PATIENT SURVEYS:  DHI 34%  COGNITION: Overall cognitive status: Within functional limits for tasks assessed  SENSATION: WFL  POSTURE: rounded shoulders and forward head  PALPATION: TTP R>L suboccipitals, R anterior  scalene, bilat UTs   CERVICAL ROM:   Active ROM A/PROM (deg) eval  Flexion 30  Extension 50 "feels weird"  Right lateral flexion 40  Left lateral flexion 30  Right rotation 40  Left rotation 52   (Blank rows = not tested)  UPPER EXTREMITY ROM: WFL grossly  UPPER EXTREMITY MMT: WFL grossly  CERVICAL SPECIAL TESTS:  Neck flexor muscle endurance test: Positive Vertebral artery test: Need to check  FUNCTIONAL TESTS:  Did not assess  VESTIBULAR ASSESSMENT:  GENERAL OBSERVATION: See above   SYMPTOM BEHAVIOR:  Subjective history: See above  Non-Vestibular symptoms: changes in vision, neck pain, and headaches  Type of dizziness: "Funny feeling in the head" and "Swimmyheaded"  Frequency: Daily; multiple times during the day (can be up to 10 times or more)  Duration: Short/brief  Aggravating factors: Spontaneous and Induced by position change: floor to standing  Relieving factors: rest and slow movements  Progression of symptoms: worse  OCULOMOTOR EXAM:  Ocular Alignment: normal  Ocular ROM: No Limitations  Spontaneous Nystagmus: absent  Gaze-Induced Nystagmus:  WFL  Smooth Pursuits:  mild corrective saccades with R inferior gaze but other no nystagmus  Saccades:  Mild symptoms with horizontal saccades  Convergence/Divergence: did not assess  Head shaking: WFL  FRENZEL - FIXATION SUPRESSED: Unable to test  VESTIBULAR - OCULAR REFLEX:   Slow VOR: Positive Right  VOR Cancellation: Comment: Mild symptoms with R head turn  Head-Impulse Test: HIT Right: negative HIT Left: negative  Dynamic Visual Acuity:  TBA  Fast VOR: No symptoms or issues horizontal or vertical   POSITIONAL TESTING: Did not assess   MOTION SENSITIVITY:    Motion Sensitivity Quotient Intensity: 0 = none, 1 = Lightheaded, 2 = Mild, 3 = Moderate, 4 = Severe, 5 = Vomiting  Intensity  1. Sitting to supine   2. Supine to L side   3. Supine to R side   4. Supine to sitting   5. L Hallpike-Dix   6.  Up from L    7. R Hallpike-Dix   8. Up from R    9. Sitting, head  tipped to L knee   10. Head up from L  knee   11. Sitting, head  tipped to R knee   12. Head up from R  knee   13. Sitting head turns x5   14.Sitting head nods x5   15. In stance, 180  turn to L    16. In stance, 180  turn to R     OTHOSTATICS:  After 5 min in supine: 134/88; 84 BPM Initial Sitting: 144/99; 91 BPM After 5 min in sitting: 128/96; 88 BPM Initial Standing: 131/97; 98 BPM After 5 min standing: 130/97; 106 BPM  FUNCTIONAL GAIT:  FGA: TBA    TODAY'S TREATMENT:  [facility] Adult PT Treatment:                                                DATE: 09/21/2022 Neuromuscular re-ed: Seated: Smooth pursuits Y/P Saccades Y/P: Y/P/diagonals VORx1 Y/P (focus on pitch plane): metronome 100 bpm x30" --> 80 bpm 5x30" Pin & stretch SCM Cervical retraction  DATE: 09/16/22 Did not initiate   PATIENT EDUCATION:  Education details: HEP Person educated: Patient Education method: Medical illustrator Education comprehension: verbalized understanding and needs further education  HOME EXERCISE PROGRAM:  Access Code: [email or web address] URL: [email or web address] Date: 09/21/2022 Prepared by: Carlynn Herald  Exercises - Sternocleidomastoid Stretch  - 1 x daily - 7 x weekly - 3 sets - 10 reps - Seated Gaze Stabilization with Head Rotation  - 1 x daily - 7 x weekly - 3 sets - 10 reps - Seated Gaze Stabilization with Head Nod  - 1 x daily - 7 x weekly - 3 sets - 10 reps   ASSESSMENT:  CLINICAL IMPRESSION: Moderate increase in symptoms with head nods in pitch plane during VORx1. Patient became agitated while performing VOR exercises in open gym; patient moved to treatment room and better tolerated remaining session. Significant tension and tightness exhibited with SCM stretch.  Manual deferred due to late-onset migraine after last session.  OBJECTIVE IMPAIRMENTS: decreased activity tolerance, decreased endurance, decreased ROM, dizziness, increased fascial restrictions, increased muscle spasms, postural dysfunction, and pain.   ACTIVITY LIMITATIONS: bending, squatting, transfers, and locomotion level  PARTICIPATION LIMITATIONS: community activity and yard work  PERSONAL FACTORS: Age, Fitness, Past/current experiences, and Time since onset of injury/illness/exacerbation, PMH including ACDF, cervical cyst and shunt are also affecting patient's functional outcome.   REHAB POTENTIAL: Good  CLINICAL DECISION MAKING: Evolving/moderate complexity  EVALUATION COMPLEXITY: Moderate   GOALS: Goals reviewed with patient? Yes  SHORT TERM GOALS: Target date: 09/30/2022  Pt will be ind with initial HEP Baseline:  Goal status: INITIAL  2.  Pt will report >/=25% decrease in frequency or intensity of her symptoms Baseline:  Goal status: INITIAL   LONG TERM GOALS: Target date: 10/28/2022  Pt will be ind with management and progression of HEP Baseline:  Goal status: INITIAL  2.  Pt will have L = R cervical motion Baseline:  Goal status: INITIAL  3.  Pt will report >/=50% decrease in overall symptoms to be able to perform yard work Baseline:  Goal status: INITIAL  4.  Pt will have improved DHI to </=16% to demo MCID Baseline:  Goal status: INITIAL    PLAN:  PT FREQUENCY: 2x/week  PT DURATION: 6 weeks  PLANNED INTERVENTIONS: Therapeutic exercises, Therapeutic activity, Neuromuscular re-education, Balance training, Gait training, Patient/Family education, Self Care, Joint mobilization, Stair training, Vestibular training, Canalith repositioning, Aquatic Therapy, Dry Needling, Electrical stimulation, Cryotherapy, Moist heat, Taping, Ultrasound, Ionotophoresis 4mg /ml Dexamethasone, Manual therapy, and Re-evaluation  PLAN FOR NEXT SESSION: Assess HEP  response. Assess vertebral artery. Monitor BP. Work on cervical ROM/manual work for trigger point release. Deep neck flexor strengthening. Gaze stabilization.    Sanjuana Mae, PTA 09/21/2022, 10:59 AM

## 2022-09-23 ENCOUNTER — Ambulatory Visit: Payer: Medicare HMO | Admitting: Physical Therapy

## 2022-09-23 ENCOUNTER — Encounter: Payer: Self-pay | Admitting: Physical Therapy

## 2022-09-23 ENCOUNTER — Other Ambulatory Visit: Payer: Self-pay | Admitting: Family Medicine

## 2022-09-23 DIAGNOSIS — R42 Dizziness and giddiness: Secondary | ICD-10-CM | POA: Diagnosis not present

## 2022-09-23 DIAGNOSIS — M62838 Other muscle spasm: Secondary | ICD-10-CM

## 2022-09-23 DIAGNOSIS — R293 Abnormal posture: Secondary | ICD-10-CM | POA: Diagnosis not present

## 2022-09-23 DIAGNOSIS — I5189 Other ill-defined heart diseases: Secondary | ICD-10-CM

## 2022-09-23 DIAGNOSIS — M542 Cervicalgia: Secondary | ICD-10-CM

## 2022-09-23 DIAGNOSIS — R0609 Other forms of dyspnea: Secondary | ICD-10-CM

## 2022-09-23 NOTE — Therapy (Signed)
OUTPATIENT PHYSICAL THERAPY CERVICAL TREATMENT   Patient Name: CAROLEANN GERTZ MRN: [id number] DOB:Oct 11, 1965, 57 y.o., female Today's Date: 09/23/2022  END OF SESSION:  PT End of Session - 09/23/22 1018     Visit Number 3    Number of Visits 12    Date for PT Re-Evaluation 10/28/22    Authorization Type [employer]    Progress Note Due on Visit 10    PT Start Time 1018    PT Stop Time 1100    PT Time Calculation (min) 42 min    Activity Tolerance Patient tolerated treatment well    Behavior During Therapy WFL for tasks assessed/performed             Past Medical History:  Diagnosis Date   Anemia    Anxiety    Arthritis    "back and knees" (07/23/2016)   Chiari malformation type I (HCC) dx'd 2014   Chronic back pain    buldging, DDD; "neck and lower back" (07/23/2016)   Chronic constipation    takes Miralax daily   Chronic pain syndrome    takes Methadone and Keppra daily   Depression    takes Effexor daily   Endometriosis    1995   Fibromyalgia    GERD (gastroesophageal reflux disease)    not taking anything at the present time   Headache    History of DVT of lower extremity 07/2012   LEFT LEG --  3/ 2014.Was on a blood transfusion for 6 months   History of kidney stones    History of MRSA infection 10 yrs ago   Hyperlipidemia    takes Simvastatin daily   Migraine    "daily recently" (07/23/2016)   Muscle spasm    takes Zanaflex nightly   Peripheral edema    takes Lasix daily as needed   Peripheral neuropathy    Pleurisy 2000   Pneumonia    hx of-7-8 yrs ago   PONV (postoperative nausea and vomiting)    Seasonal allergies    takes Claritin daily   Spinal headache    "since 2002; still get them; try to manage it w/lying flat"   Past Surgical History:  Procedure Laterality Date   ANTERIOR CERVICAL DECOMP/DISCECTOMY FUSION  07/25/2009   C5 -- C6   APPENDECTOMY  age 67   APPLICATION OF CRANIAL NAVIGATION N/A 06/23/2017   Procedure: APPLICATION OF  CRANIAL NAVIGATION;  Surgeon: Donalee Citrin, MD;  Location: [facility] OR;  Service: Neurosurgery;  Laterality: N/A;   BACK SURGERY     BRAIN SURGERY     VP shunt   BREAST REDUCTION SURGERY Bilateral 06/07/2013   Procedure: BILATERAL MAMMARY REDUCTION  (BREAST);  Surgeon: Wayland Denis, DO;  Location: [facility];  Service: Plastics;  Laterality: Bilateral;   BREAST REDUCTION SURGERY Bilateral 06/07/2013   Procedure:  LIPOSUCTION;  Surgeon: Wayland Denis, DO;  Location: [facility];  Service: Plastics;  Laterality: Bilateral;   CARPAL TUNNEL RELEASE Bilateral 2001   COLONOSCOPY  X 2   "polyps were benign"   CYSTOSCOPY WITH RETROGRADE PYELOGRAM, URETEROSCOPY AND STENT PLACEMENT Left 05/2021   (in my chart)   DILATION AND CURETTAGE OF UTERUS     ESOPHAGOGASTRODUODENOSCOPY     gastric banding undone  11/2014   LAPAROSCOPIC CHOLECYSTECTOMY  ~ 2000   LAPAROSCOPIC GASTRIC BANDING  2011   LAPAROSCOPIC REVISION VENTRICULAR-PERITONEAL (V-P) SHUNT N/A 06/23/2017   Procedure: Shunt Placment stereotactic ventricular catheter placement and general surgery abdominal exposure  (  Laparoscopic vs. Open) Brain Lab;  Surgeon: Berna Bue, MD;  Location: [facility] OR;  Service: General;  Laterality: N/A;   LAPAROSCOPY N/A 06/23/2017   Procedure: LAPAROSCOPY DIAGNOSTIC;  Surgeon: Berna Bue, MD;  Location: [facility] OR;  Service: General;  Laterality: N/A;   LUMBAR LAMINECTOMY/DECOMPRESSION MICRODISCECTOMY N/A 07/23/2016   Procedure: Decompressive Lumbar Laminectomy Lumbar two-three  for Repair of  Cerberal Spinal Fluid  Leak;  Surgeon: Donalee Citrin, MD;  Location: [facility] OR;  Service: Neurosurgery;  Laterality: N/A;   LUMBAR LAMINECTOMY/DECOMPRESSION MICRODISCECTOMY Left 03/01/2019   Procedure: LEFT LUMBAR THREE-FOUR EXTRAFORAMINAL MICRODISCECTOMY;  Surgeon: Donalee Citrin, MD;  Location: [facility] OR;  Service: Neurosurgery;  Laterality: Left;   PLACEMENT LUMBOPERITONEAL SHUNT FOR CEREBROSPINAL FLUID DIVERSION  AWAY FROM C8 PERINEURAL CYST  09-05-2003  &  12-24-2003   08-31-2003  REMOVAL SHUNT   POSTERIOR CERVICAL LAMINECTOMY  03/01/2001   C7-8 and Exploration C8 perineural cyst   POSTERIOR LUMBAR LAMINECTOMY L4-5/  DISKECTOMY L5-S1  05/20/2005   RE-DO DECOMPRESSION LAMINECTOMY AND FUSION L4-5  &  L5-S1  12/20/2006   02-04-2007--  RE-EXPLORATION L4 to S1, I & D LUMBAR WOUND HEMATOMA   REDUCTION MAMMAPLASTY     SHUNT REMOVAL N/A 07/10/2016   Procedure: SHUNT REMOVAL;  Surgeon: Donalee Citrin, MD;  Location: [facility] OR;  Service: Neurosurgery;  Laterality: N/A;  SHUNT REMOVAL   SPINAL CORD STIMULATOR INSERTION N/A 04/26/2015   Procedure: LUMBAR SPINAL CORD STIMULATOR INSERTION;  Surgeon: Odette Fraction, MD;  Location: [facility];  Service: Neurosurgery;  Laterality: N/A;  LUMBAR SPINAL CORD STIMULATOR INSERTION   SPINAL CORD STIMULATOR REMOVAL N/A 06/28/2019   Procedure: Removal of dorsal column stimulator and generator -;  Surgeon: Donalee Citrin, MD;  Location: [facility] OR;  Service: Neurosurgery;  Laterality: N/A;   TENNIS ELBOW RELEASE/NIRSCHEL PROCEDURE Left 12/13/2014   Procedure: LEFT ELBOW LATERAL EPICONDYLAR DEBRIDEMENT AND OR REPAIR -RECONSTRUCTION ;  Surgeon: Bradly Bienenstock, MD;  Location: [facility];  Service: Orthopedics;  Laterality: Left;   TOTAL ABDOMINAL HYSTERECTOMY  1994   w/  Bilateral Salpinoophorectomy   VENTRICULOPERITONEAL SHUNT N/A 06/23/2017   Procedure: VENTRICULAR-PERITONEAL SHUNT INSERTION WITH ABDOMINAL LAPARASCOPY AND BRAINLAB NAVIGATION;  Surgeon: Donalee Citrin, MD;  Location: [facility] OR;  Service: Neurosurgery;  Laterality: N/A;   Patient Active Problem List   Diagnosis Date Noted   Chronic dyspnea 09/04/2022   Chest pain on breathing 04/23/2022   Dizziness 02/05/2022   AKI (acute kidney injury) (HCC) 06/22/2021   Occipital neuralgia of right side 05/01/2021   Sinobronchitis 03/07/2021   Eczema 02/19/2021   Flank pain with history of urolithiasis 11/29/2020   Right flank pain  10/16/2020   Shingles 09/02/2020   URI (upper respiratory infection) 08/21/2020   Elevated blood pressure reading 02/13/2020   Abnormal urinalysis 02/13/2020   Nephrolithiasis 02/08/2020   Hematuria 11/26/2019   Other fatigue 11/26/2019   Bronchitis 09/12/2019   Syncopal episodes 07/26/2019   COVID-19 07/26/2019   Viral illness 07/19/2019   HNP (herniated nucleus pulposus), lumbar 03/01/2019   Cubital tunnel syndrome on left 03/17/2018   Plantar fasciitis of left foot 03/17/2018   CSF leak 06/23/2017   Trochanteric bursitis, left hip 06/09/2017   Pseudotumor cerebri 03/31/2017   Obesity 09/15/2016   Abnormal weight gain 09/03/2016   Chiari I malformation (HCC) 07/09/2016   Prediabetes 02/21/2016   RLS (restless legs syndrome) 10/23/2015   Seasonal allergies 10/23/2015   Hyperlipidemia 07/17/2015   Osteoarthritis of carpometacarpal joint of left thumb 06/28/2015  Fibromyalgia 05/30/2015   Chronic back pain 05/09/2015   Therapeutic opioid-induced constipation (OIC) 01/31/2015   Insomnia 07/09/2014   Generalized anxiety disorder 04/03/2014   Spider angioma 10/11/2013   Chronic venous insufficiency 09/25/2013   Symptomatic mammary hypertrophy 05/30/2013   Nevus, non-neoplastic 08/01/2012   Pain syndrome, chronic 06/20/2012   Postlaminectomy syndrome of lumbar region 06/20/2012   Primary osteoarthritis of both knees 02/08/2012   MDD (major depressive disorder) 01/28/2012   Subluxation of peroneal tendon of right foot 01/28/2012   GERD (gastroesophageal reflux disease) 01/28/2012   Complication of gastric band procedure 08/26/2011    PCP: Everrett Coombe, DO  REFERRING PROVIDER: Everrett Coombe, DO  REFERRING DIAG: M54.2 (ICD-10-CM) - Neck pain R42 (ICD-10-CM) - Dizziness  THERAPY DIAG:  Dizziness and giddiness  Abnormal posture  Cervicalgia  Other muscle spasm  Rationale for Evaluation and Treatment: Rehabilitation  ONSET DATE: ~6 months  SUBJECTIVE:                                                                                                                                                                                                          SUBJECTIVE STATEMENT: Pt states she was not able to do the exercises -- has been helping her friend move. Lung CT went well. Feels a little like a migraine is going to set off.  Hand dominance: Right  PERTINENT HISTORY:  Pt states her neck and back are fused; history of cyst in her spine, shunt  PAIN:  Are you having pain? Yes: NPRS scale: 1-2 currently (baseline)/10 Pain location: Neck Pain description: Stiffness/ache/dull Aggravating factors: Constant and chronic Relieving factors: Ice  PRECAUTIONS: Fall  WEIGHT BEARING RESTRICTIONS: No  FALLS:  Has patient fallen in last 6 months? No  LIVING ENVIRONMENT: Lives with: lives with their family daughter (39 y/o) and mom Lives in: House/apartment Stairs: No Has following equipment at home: None  OCCUPATION: On disability; likes to ride bike and work out in the yard  PLOF: Independent  PATIENT GOALS: Improve dizziness  NEXT MD VISIT: Cardiologist 10/06/22  OBJECTIVE:   DIAGNOSTIC FINDINGS:  CXR 07/21/22: (-) CT cervical 10/18/21 IMPRESSION: 1. No acute osseous abnormality or evidence of significant degenerative spondylosis of the cervical spine. 2. Prior C5-6 ACDF with solid interbody fusion. 3. Unchanged congenital findings spinal dysraphism. Grossly stable right C7 perineural root sleeve cyst.  PATIENT SURVEYS:  DHI 34%  COGNITION: Overall cognitive status: Within functional limits for tasks assessed  SENSATION: WFL  POSTURE: rounded shoulders and forward head  PALPATION: TTP R>L suboccipitals, R anterior scalene, bilat  UTs   CERVICAL ROM:   Active ROM A/PROM (deg) eval  Flexion 30  Extension 50 "feels weird"  Right lateral flexion 40  Left lateral flexion 30  Right rotation 40  Left rotation 52   (Blank rows = not  tested)  UPPER EXTREMITY ROM: WFL grossly  UPPER EXTREMITY MMT: WFL grossly  CERVICAL SPECIAL TESTS:  Neck flexor muscle endurance test: Positive Vertebral artery test: Need to check  FUNCTIONAL TESTS:  Did not assess  VESTIBULAR ASSESSMENT:  GENERAL OBSERVATION: See above   SYMPTOM BEHAVIOR:  Subjective history: See above  Non-Vestibular symptoms: changes in vision, neck pain, and headaches  Type of dizziness: "Funny feeling in the head" and "Swimmyheaded"  Frequency: Daily; multiple times during the day (can be up to 10 times or more)  Duration: Short/brief  Aggravating factors: Spontaneous and Induced by position change: floor to standing  Relieving factors: rest and slow movements  Progression of symptoms: worse  OCULOMOTOR EXAM:  Ocular Alignment: normal  Ocular ROM: No Limitations  Spontaneous Nystagmus: absent  Gaze-Induced Nystagmus:  WFL  Smooth Pursuits:  mild corrective saccades with R inferior gaze but other no nystagmus  Saccades:  Mild symptoms with horizontal saccades  Convergence/Divergence: did not assess  Head shaking: WFL  FRENZEL - FIXATION SUPRESSED: Unable to test  VESTIBULAR - OCULAR REFLEX:   Slow VOR: Positive Right  VOR Cancellation: Comment: Mild symptoms with R head turn  Head-Impulse Test: HIT Right: negative HIT Left: negative  Dynamic Visual Acuity:  TBA  Fast VOR: No symptoms or issues horizontal or vertical   POSITIONAL TESTING: Did not assess   MOTION SENSITIVITY:    Motion Sensitivity Quotient Intensity: 0 = none, 1 = Lightheaded, 2 = Mild, 3 = Moderate, 4 = Severe, 5 = Vomiting  Intensity  1. Sitting to supine   2. Supine to L side   3. Supine to R side   4. Supine to sitting   5. L Hallpike-Dix   6. Up from L    7. R Hallpike-Dix   8. Up from R    9. Sitting, head  tipped to L knee   10. Head up from L  knee   11. Sitting, head  tipped to R knee   12. Head up from R  knee   13. Sitting head turns x5    14.Sitting head nods x5   15. In stance, 180  turn to L    16. In stance, 180  turn to R     OTHOSTATICS:  After 5 min in supine: 134/88; 84 BPM Initial Sitting: 144/99; 91 BPM After 5 min in sitting: 128/96; 88 BPM Initial Standing: 131/97; 98 BPM After 5 min standing: 130/97; 106 BPM  FUNCTIONAL GAIT:  FGA: TBA    TODAY'S TREATMENT:  [facility] Adult PT Treatment:                                                DATE: 09/23/22 Therapeutic Exercise: SCM stretch 2x30 sec Cervical retraction 10x5 sec Cervical retraction + rotation x10 Shoulder ER red TB 2x10 "W" red TB 2x10 Doorway pec stretch x30 sec low and mid Manual Therapy: Pec minor/major STM & TPR Scar massage Neuromuscular re-ed: Seated VOR x 1, 100 bpm 2 x 30 sec, horziontal & vertical Seated saccades 2x30 sec horizontal & vertical Seated corrective saccades  2x30 sec horizontal, standing corrective saccades 2x30 sec    [facility] Adult PT Treatment:                                                DATE: 09/21/2022 Neuromuscular re-ed: Seated: Smooth pursuits Y/P Saccades Y/P: Y/P/diagonals VORx1 Y/P (focus on pitch plane): metronome 100 bpm x30" --> 80 bpm 5x30" Pin & stretch SCM Cervical retraction                                                                                                                               DATE: 09/16/22 Did not initiate   PATIENT EDUCATION:  Education details: HEP Person educated: Patient Education method: Medical illustrator Education comprehension: verbalized understanding and needs further education  HOME EXERCISE PROGRAM: Access Code: [email or web address] URL: [email or web address] Date: 09/23/2022 Prepared by: Vernon Prey April Kirstie Peri  Exercises - Sternocleidomastoid Stretch  - 1 x daily - 7 x weekly - 3 sets - 10 reps - Seated Gaze Stabilization with Head Rotation  - 1 x daily - 7 x weekly - 3 sets - 10 reps - Seated Gaze Stabilization with Head Nod  - 1 x daily  - 7 x weekly - 3 sets - 10 reps - Seated Scapular Retraction  - 1 x daily - 7 x weekly - 2 sets - 10 reps - Seated Cervical Retraction  - 1 x daily - 7 x weekly - 2 sets - 10 reps - Doorway Pec Stretch at 60 Elevation  - 1 x daily - 7 x weekly - 2 sets - 30 sec hold - Doorway Pec Stretch at 90 Degrees Abduction  - 1 x daily - 7 x weekly - 2 sets - 30 sec hold   ASSESSMENT:  CLINICAL IMPRESSION: Pt's HR increases to 110s with resistive postural exercises. At rest, HR in high 90s to low 100s. Pt found to have tight R pec minor/major with residual scar tissue from prior rib resection which may be contributing to SCM tightness. Provided stretches and manual work accordingly. Initiated postural strengthening and continued gaze stabilization.   OBJECTIVE IMPAIRMENTS: decreased activity tolerance, decreased endurance, decreased ROM, dizziness, increased fascial restrictions, increased muscle spasms, postural dysfunction, and pain.     GOALS: Goals reviewed with patient? Yes  SHORT TERM GOALS: Target date: 09/30/2022  Pt will be ind with initial HEP Baseline:  Goal status: INITIAL  2.  Pt will report >/=25% decrease in frequency or intensity of her symptoms Baseline:  Goal status: INITIAL   LONG TERM GOALS: Target date: 10/28/2022  Pt will be ind with management and progression of HEP Baseline:  Goal status: INITIAL  2.  Pt will have L = R cervical motion Baseline:  Goal status: INITIAL  3.  Pt will report >/=50% decrease in overall symptoms to be  able to perform yard work Baseline:  Goal status: INITIAL  4.  Pt will have improved DHI to </=16% to demo MCID Baseline:  Goal status: INITIAL    PLAN:  PT FREQUENCY: 2x/week  PT DURATION: 6 weeks  PLANNED INTERVENTIONS: Therapeutic exercises, Therapeutic activity, Neuromuscular re-education, Balance training, Gait training, Patient/Family education, Self Care, Joint mobilization, Stair training, Vestibular training, Canalith  repositioning, Aquatic Therapy, Dry Needling, Electrical stimulation, Cryotherapy, Moist heat, Taping, Ultrasound, Ionotophoresis 4mg /ml Dexamethasone, Manual therapy, and Re-evaluation  PLAN FOR NEXT SESSION: Assess HEP response. Assess vertebral artery. Monitor BP. Work on cervical ROM/manual work for trigger point release. Deep neck flexor strengthening. Gaze stabilization.    Calin Ellery April Ma L Sherrilyn Nairn, PT 09/23/2022, 10:18 AM

## 2022-09-24 ENCOUNTER — Encounter: Payer: Self-pay | Admitting: Family Medicine

## 2022-09-28 ENCOUNTER — Ambulatory Visit: Payer: Medicare HMO

## 2022-09-28 DIAGNOSIS — M542 Cervicalgia: Secondary | ICD-10-CM

## 2022-09-28 DIAGNOSIS — M62838 Other muscle spasm: Secondary | ICD-10-CM | POA: Diagnosis not present

## 2022-09-28 DIAGNOSIS — R293 Abnormal posture: Secondary | ICD-10-CM

## 2022-09-28 DIAGNOSIS — R42 Dizziness and giddiness: Secondary | ICD-10-CM | POA: Diagnosis not present

## 2022-09-28 NOTE — Progress Notes (Signed)
Referring-Katrina Jerolyn Center, DO Reason for referral-dyspnea and chest pain  HPI: 57 year old female for evaluation of dyspnea and chest pain at request of Milford Cage, DO.  Patient last seen September 2021.  Coronary CTA November 2021 showed calcium score 0 and no coronary disease.  Monitor December 2023 showed sinus rhythm, 12 beats SVT, rare PAC and PVC.  Echocardiogram April 2024 showed ejection fraction 60 to 65%, grade 1 diastolic dysfunction.  Laboratories March 2024 showed hemoglobin 14.2, creatinine 0.84, TSH 0.93.  High-resolution chest CT May 2024 showed no interstitial lung disease.  Since last seen patient apparently was recently alerted by her Apple Watch that there could be atrial fibrillation.  I have reviewed those strips and it appears to be sinus.  Patient states that for the past 6 months she has had worsening dyspnea on exertion.  No orthopnea or PND but occasional minimal ankle edema.  She also has had chest pain in the left upper chest area.  She notices this more with activities but it can occur at rest.  Typically last 1 to 2 minutes and resolves.  No associated symptoms.  She denies palpitations.  At the time of question atrial fibrillation on her Apple Watch she was having chest pain which was the reason she recorded the strips.  Current Outpatient Medications  Medication Sig Dispense Refill   DULoxetine (CYMBALTA) 60 MG capsule TAKE 2 CAPSULES BY MOUTH EVERY DAY 180 capsule 1   methocarbamol (ROBAXIN) 500 MG tablet Take 500 mg by mouth every 6 (six) hours as needed for muscle spasms.     montelukast (SINGULAIR) 10 MG tablet TAKE 1 TABLET BY MOUTH EVERYDAY AT BEDTIME 90 tablet 1   NARCAN 4 MG/0.1ML LIQD nasal spray kit Place 0.4 mg into the nose once.      polyethylene glycol (MIRALAX / GLYCOLAX) 17 g packet Take 17 g by mouth daily as needed for mild constipation or moderate constipation.     pramipexole (MIRAPEX) 0.5 MG tablet TAKE 1 TABLET BY MOUTH EVERYDAY AT BEDTIME 90  tablet 1   rizatriptan (MAXALT) 10 MG tablet Take 1 tablet (10 mg total) by mouth as needed for migraine. May repeat in 2 hours if needed 10 tablet 6   simvastatin (ZOCOR) 20 MG tablet TAKE 1 TABLET BY MOUTH EVERY DAY 90 tablet 1   tirzepatide (MOUNJARO) 12.5 MG/0.5ML Pen INJECT 12.5 MG SUBCUTANEOUSLY ONE TIME PER WEEK 2 mL 2   traMADol (ULTRAM) 50 MG tablet Take 50-100 mg by mouth every 8 (eight) hours as needed (Pain).      No current facility-administered medications for this visit.    Allergies  Allergen Reactions   Penicillins Anaphylaxis    11/28/20 - patient tolerated Keflex w/o adverse effects    Tape Rash    Plastic tape, silicone tape, Steri Strips   Bactrim [Sulfamethoxazole-Trimethoprim] Nausea Only   Paxlovid [Nirmatrelvir-Ritonavir] Nausea And Vomiting   Chlorhexidine Rash and Other (See Comments)    Dermatitis - Allergic     Past Medical History:  Diagnosis Date   Anemia    Anxiety    Arthritis    "back and knees" (07/23/2016)   Chiari malformation type I (HCC) dx'd 2014   Chronic back pain    buldging, DDD; "neck and lower back" (07/23/2016)   Chronic constipation    takes Miralax daily   Chronic pain syndrome    takes Methadone and Keppra daily   Depression    takes Effexor daily   Endometriosis  1995   Fibromyalgia    GERD (gastroesophageal reflux disease)    not taking anything at the present time   Headache    History of DVT of lower extremity 07/2012   LEFT LEG --  3/ 2014.Was on a blood transfusion for 6 months   History of kidney stones    History of MRSA infection 10 yrs ago   Hyperlipidemia    takes Simvastatin daily   Migraine    "daily recently" (07/23/2016)   Muscle spasm    takes Zanaflex nightly   Peripheral edema    takes Lasix daily as needed   Peripheral neuropathy    Pleurisy 2000   Pneumonia    hx of-7-8 yrs ago   PONV (postoperative nausea and vomiting)    Seasonal allergies    takes Claritin daily   Spinal headache     "since 2002; still get them; try to manage it w/lying flat"    Past Surgical History:  Procedure Laterality Date   ANTERIOR CERVICAL DECOMP/DISCECTOMY FUSION  07/25/2009   C5 -- C6   APPENDECTOMY  age 57   APPLICATION OF CRANIAL NAVIGATION N/A 06/23/2017   Procedure: APPLICATION OF CRANIAL NAVIGATION;  Surgeon: Donalee Citrin, MD;  Location: [facility] OR;  Service: Neurosurgery;  Laterality: N/A;   BACK SURGERY     BRAIN SURGERY     VP shunt   BREAST REDUCTION SURGERY Bilateral 06/07/2013   Procedure: BILATERAL MAMMARY REDUCTION  (BREAST);  Surgeon: Wayland Denis, DO;  Location: [facility];  Service: Plastics;  Laterality: Bilateral;   BREAST REDUCTION SURGERY Bilateral 06/07/2013   Procedure:  LIPOSUCTION;  Surgeon: Wayland Denis, DO;  Location: [facility];  Service: Plastics;  Laterality: Bilateral;   CARPAL TUNNEL RELEASE Bilateral 2001   COLONOSCOPY  X 2   "polyps were benign"   CYSTOSCOPY WITH RETROGRADE PYELOGRAM, URETEROSCOPY AND STENT PLACEMENT Left 05/2021   (in my chart)   DILATION AND CURETTAGE OF UTERUS     ESOPHAGOGASTRODUODENOSCOPY     gastric banding undone  11/2014   LAPAROSCOPIC CHOLECYSTECTOMY  ~ 2000   LAPAROSCOPIC GASTRIC BANDING  2011   LAPAROSCOPIC REVISION VENTRICULAR-PERITONEAL (V-P) SHUNT N/A 06/23/2017   Procedure: Shunt Placment stereotactic ventricular catheter placement and general surgery abdominal exposure  (Laparoscopic vs. Open) Brain Lab;  Surgeon: Berna Bue, MD;  Location: [facility] OR;  Service: General;  Laterality: N/A;   LAPAROSCOPY N/A 06/23/2017   Procedure: LAPAROSCOPY DIAGNOSTIC;  Surgeon: Berna Bue, MD;  Location: [facility] OR;  Service: General;  Laterality: N/A;   LUMBAR LAMINECTOMY/DECOMPRESSION MICRODISCECTOMY N/A 07/23/2016   Procedure: Decompressive Lumbar Laminectomy Lumbar two-three  for Repair of  Cerberal Spinal Fluid  Leak;  Surgeon: Donalee Citrin, MD;  Location: [facility];  Service: Neurosurgery;  Laterality: N/A;    LUMBAR LAMINECTOMY/DECOMPRESSION MICRODISCECTOMY Left 03/01/2019   Procedure: LEFT LUMBAR THREE-FOUR EXTRAFORAMINAL MICRODISCECTOMY;  Surgeon: Donalee Citrin, MD;  Location: [facility] OR;  Service: Neurosurgery;  Laterality: Left;   PLACEMENT LUMBOPERITONEAL SHUNT FOR CEREBROSPINAL FLUID DIVERSION AWAY FROM C8 PERINEURAL CYST  09-05-2003  &  12-24-2003   08-31-2003  REMOVAL SHUNT   POSTERIOR CERVICAL LAMINECTOMY  03/01/2001   C7-8 and Exploration C8 perineural cyst   POSTERIOR LUMBAR LAMINECTOMY L4-5/  DISKECTOMY L5-S1  05/20/2005   RE-DO DECOMPRESSION LAMINECTOMY AND FUSION L4-5  &  L5-S1  12/20/2006   02-04-2007--  RE-EXPLORATION L4 to S1, I & D LUMBAR WOUND HEMATOMA   REDUCTION MAMMAPLASTY     SHUNT REMOVAL N/A  07/10/2016   Procedure: SHUNT REMOVAL;  Surgeon: Donalee Citrin, MD;  Location: [facility] OR;  Service: Neurosurgery;  Laterality: N/A;  SHUNT REMOVAL   SPINAL CORD STIMULATOR INSERTION N/A 04/26/2015   Procedure: LUMBAR SPINAL CORD STIMULATOR INSERTION;  Surgeon: Odette Fraction, MD;  Location: [facility];  Service: Neurosurgery;  Laterality: N/A;  LUMBAR SPINAL CORD STIMULATOR INSERTION   SPINAL CORD STIMULATOR REMOVAL N/A 06/28/2019   Procedure: Removal of dorsal column stimulator and generator -;  Surgeon: Donalee Citrin, MD;  Location: [facility];  Service: Neurosurgery;  Laterality: N/A;   TENNIS ELBOW RELEASE/NIRSCHEL PROCEDURE Left 12/13/2014   Procedure: LEFT ELBOW LATERAL EPICONDYLAR DEBRIDEMENT AND OR REPAIR -RECONSTRUCTION ;  Surgeon: Bradly Bienenstock, MD;  Location: [facility];  Service: Orthopedics;  Laterality: Left;   TOTAL ABDOMINAL HYSTERECTOMY  1994   w/  Bilateral Salpinoophorectomy   VENTRICULOPERITONEAL SHUNT N/A 06/23/2017   Procedure: VENTRICULAR-PERITONEAL SHUNT INSERTION WITH ABDOMINAL LAPARASCOPY AND BRAINLAB NAVIGATION;  Surgeon: Donalee Citrin, MD;  Location: [facility] OR;  Service: Neurosurgery;  Laterality: N/A;    Social History   Socioeconomic History   Marital status: Divorced     Spouse name: Not on file   Number of children: 2   Years of education: 14   Highest education level: Associate degree: academic program  Occupational History   Occupation: Disabled   Tobacco Use   Smoking status: Never   Smokeless tobacco: Never  Building services engineer Use: Never used  Substance and Sexual Activity   Alcohol use: Yes    Comment: once a year   Drug use: No   Sexual activity: Not Currently    Birth control/protection: Surgical  Other Topics Concern   Not on file  Social History Narrative   Mrs Palleschi is divorced, on permanent disability and lives at home with her mother and her daughter. She is independent in her care needs. She denies concern with transportation to medical appointments She is on a fixed income. She enjoys crafting and quilting.   Social Determinants of Health   Financial Resource Strain: Low Risk  (09/04/2022)   Overall Financial Resource Strain (CARDIA)    Difficulty of Paying Living Expenses: Not very hard  Food Insecurity: No Food Insecurity (09/04/2022)   Hunger Vital Sign    Worried About Running Out of Food in the Last Year: Never true    Ran Out of Food in the Last Year: Never true  Transportation Needs: No Transportation Needs (09/04/2022)   PRAPARE - Administrator, Civil Service (Medical): No    Lack of Transportation (Non-Medical): No  Physical Activity: Inactive (09/04/2022)   Exercise Vital Sign    Days of Exercise per Week: 0 days    Minutes of Exercise per Session: 0 min  Stress: No Stress Concern Present (09/04/2022)   [employer] of Occupational Health - Occupational Stress Questionnaire    Feeling of Stress : Only a little  Recent Concern: Stress - Stress Concern Present (08/03/2022)   [employer] of Occupational Health - Occupational Stress Questionnaire    Feeling of Stress : To some extent  Social Connections: Unknown (09/04/2022)   Social Connection and Isolation Panel [NHANES]    Frequency of  Communication with Friends and Family: More than three times a week    Frequency of Social Gatherings with Friends and Family: Twice a week    Attends Religious Services: Patient declined    Database administrator or Organizations: Yes  Attends Banker Meetings: 1 to 4 times per year    Marital Status: Divorced  Recent Concern: Social Connections - Moderately Isolated (08/03/2022)   Social Connection and Isolation Panel [NHANES]    Frequency of Communication with Friends and Family: Three times a week    Frequency of Social Gatherings with Friends and Family: More than three times a week    Attends Religious Services: Never    Database administrator or Organizations: Yes    Attends Engineer, structural: More than 4 times per year    Marital Status: Divorced  Intimate Partner Violence: Not At Risk (08/03/2022)   Humiliation, Afraid, Rape, and Kick questionnaire    Fear of Current or Ex-Partner: No    Emotionally Abused: No    Physically Abused: No    Sexually Abused: No    Family History  Problem Relation Age of Onset   COPD Mother    Diabetes Mother    Asthma Mother    Diabetes Father    Colon cancer Father    Ovarian cancer Maternal Grandmother    Pancreatic cancer Maternal Grandfather    Depression Other     ROS: no fevers or chills, productive cough, hemoptysis, dysphasia, odynophagia, melena, hematochezia, dysuria, hematuria, rash, seizure activity, orthopnea, PND, pedal edema, claudication. Remaining systems are negative.  Physical Exam:   Blood pressure 132/80, pulse 81, height 5\' 3"  (1.6 m), weight 152 lb 3.2 oz (69 kg), SpO2 97 %.  General:  Well developed/well nourished in NAD Skin warm/dry Patient not depressed No peripheral clubbing Back-normal HEENT-normal/normal eyelids Neck supple/normal carotid upstroke bilaterally; no bruits; no JVD; no thyromegaly chest - CTA/ normal expansion CV - RRR/normal S1 and S2; no murmurs, rubs or  gallops;  PMI nondisplaced Abdomen -NT/ND, no HSM, no mass, + bowel sounds, no bruit 2+ femoral pulses, no bruits Ext-no edema, chords, 2+ DP Neuro-grossly nonfocal  ECG -normal sinus rhythm at a rate of 81, normal axis, no ST changes.  Personally reviewed  A/P  1 dyspnea-etiology unclear.  Will arrange cardiac CTA to rule out obstructive coronary disease.  Note she did have a high-resolution chest CT recently did not did not show significant interstitial lung disease.  2 chest pain-symptoms with both typical and atypical features.  Will arrange coronary CTA to rule out obstructive coronary disease.  3 hyperlipidemia-continue statin.  4 question A-fib-I have reviewed the monitor strips from May and this appears to show sinus rhythm though there is some artifact and 1 tracing difficult to interpret.  She will forward any strips in the future for [place] to review.  Olga Millers, MD

## 2022-09-28 NOTE — Therapy (Signed)
OUTPATIENT PHYSICAL THERAPY CERVICAL TREATMENT   Patient Name: Katrina Fernandez MRN: [id number] DOB:1965-09-27, 57 y.o., female Today's Date: 09/28/2022  END OF SESSION:  PT End of Session - 09/28/22 0805     Visit Number 4    Number of Visits 12    Date for PT Re-Evaluation 10/28/22    Authorization Type [employer]    Progress Note Due on Visit 10    PT Start Time 0805    PT Stop Time 0845    PT Time Calculation (min) 40 min    Activity Tolerance Patient tolerated treatment well    Behavior During Therapy WFL for tasks assessed/performed             Past Medical History:  Diagnosis Date   Anemia    Anxiety    Arthritis    "back and knees" (07/23/2016)   Chiari malformation type I (HCC) dx'd 2014   Chronic back pain    buldging, DDD; "neck and lower back" (07/23/2016)   Chronic constipation    takes Miralax daily   Chronic pain syndrome    takes Methadone and Keppra daily   Depression    takes Effexor daily   Endometriosis    1995   Fibromyalgia    GERD (gastroesophageal reflux disease)    not taking anything at the present time   Headache    History of DVT of lower extremity 07/2012   LEFT LEG --  3/ 2014.Was on a blood transfusion for 6 months   History of kidney stones    History of MRSA infection 10 yrs ago   Hyperlipidemia    takes Simvastatin daily   Migraine    "daily recently" (07/23/2016)   Muscle spasm    takes Zanaflex nightly   Peripheral edema    takes Lasix daily as needed   Peripheral neuropathy    Pleurisy 2000   Pneumonia    hx of-7-8 yrs ago   PONV (postoperative nausea and vomiting)    Seasonal allergies    takes Claritin daily   Spinal headache    "since 2002; still get them; try to manage it w/lying flat"   Past Surgical History:  Procedure Laterality Date   ANTERIOR CERVICAL DECOMP/DISCECTOMY FUSION  07/25/2009   C5 -- C6   APPENDECTOMY  age 9   APPLICATION OF CRANIAL NAVIGATION N/A 06/23/2017   Procedure: APPLICATION  OF CRANIAL NAVIGATION;  Surgeon: Donalee Citrin, MD;  Location: [facility] OR;  Service: Neurosurgery;  Laterality: N/A;   BACK SURGERY     BRAIN SURGERY     VP shunt   BREAST REDUCTION SURGERY Bilateral 06/07/2013   Procedure: BILATERAL MAMMARY REDUCTION  (BREAST);  Surgeon: Wayland Denis, DO;  Location: [facility];  Service: Plastics;  Laterality: Bilateral;   BREAST REDUCTION SURGERY Bilateral 06/07/2013   Procedure:  LIPOSUCTION;  Surgeon: Wayland Denis, DO;  Location: [facility];  Service: Plastics;  Laterality: Bilateral;   CARPAL TUNNEL RELEASE Bilateral 2001   COLONOSCOPY  X 2   "polyps were benign"   CYSTOSCOPY WITH RETROGRADE PYELOGRAM, URETEROSCOPY AND STENT PLACEMENT Left 05/2021   (in my chart)   DILATION AND CURETTAGE OF UTERUS     ESOPHAGOGASTRODUODENOSCOPY     gastric banding undone  11/2014   LAPAROSCOPIC CHOLECYSTECTOMY  ~ 2000   LAPAROSCOPIC GASTRIC BANDING  2011   LAPAROSCOPIC REVISION VENTRICULAR-PERITONEAL (V-P) SHUNT N/A 06/23/2017   Procedure: Shunt Placment stereotactic ventricular catheter placement and general surgery abdominal exposure  (  Laparoscopic vs. Open) Brain Lab;  Surgeon: Berna Bue, MD;  Location: [facility] OR;  Service: General;  Laterality: N/A;   LAPAROSCOPY N/A 06/23/2017   Procedure: LAPAROSCOPY DIAGNOSTIC;  Surgeon: Berna Bue, MD;  Location: [facility] OR;  Service: General;  Laterality: N/A;   LUMBAR LAMINECTOMY/DECOMPRESSION MICRODISCECTOMY N/A 07/23/2016   Procedure: Decompressive Lumbar Laminectomy Lumbar two-three  for Repair of  Cerberal Spinal Fluid  Leak;  Surgeon: Donalee Citrin, MD;  Location: [facility] OR;  Service: Neurosurgery;  Laterality: N/A;   LUMBAR LAMINECTOMY/DECOMPRESSION MICRODISCECTOMY Left 03/01/2019   Procedure: LEFT LUMBAR THREE-FOUR EXTRAFORAMINAL MICRODISCECTOMY;  Surgeon: Donalee Citrin, MD;  Location: [facility] OR;  Service: Neurosurgery;  Laterality: Left;   PLACEMENT LUMBOPERITONEAL SHUNT FOR CEREBROSPINAL FLUID  DIVERSION AWAY FROM C8 PERINEURAL CYST  09-05-2003  &  12-24-2003   08-31-2003  REMOVAL SHUNT   POSTERIOR CERVICAL LAMINECTOMY  03/01/2001   C7-8 and Exploration C8 perineural cyst   POSTERIOR LUMBAR LAMINECTOMY L4-5/  DISKECTOMY L5-S1  05/20/2005   RE-DO DECOMPRESSION LAMINECTOMY AND FUSION L4-5  &  L5-S1  12/20/2006   02-04-2007--  RE-EXPLORATION L4 to S1, I & D LUMBAR WOUND HEMATOMA   REDUCTION MAMMAPLASTY     SHUNT REMOVAL N/A 07/10/2016   Procedure: SHUNT REMOVAL;  Surgeon: Donalee Citrin, MD;  Location: [facility] OR;  Service: Neurosurgery;  Laterality: N/A;  SHUNT REMOVAL   SPINAL CORD STIMULATOR INSERTION N/A 04/26/2015   Procedure: LUMBAR SPINAL CORD STIMULATOR INSERTION;  Surgeon: Odette Fraction, MD;  Location: [facility];  Service: Neurosurgery;  Laterality: N/A;  LUMBAR SPINAL CORD STIMULATOR INSERTION   SPINAL CORD STIMULATOR REMOVAL N/A 06/28/2019   Procedure: Removal of dorsal column stimulator and generator -;  Surgeon: Donalee Citrin, MD;  Location: [facility] OR;  Service: Neurosurgery;  Laterality: N/A;   TENNIS ELBOW RELEASE/NIRSCHEL PROCEDURE Left 12/13/2014   Procedure: LEFT ELBOW LATERAL EPICONDYLAR DEBRIDEMENT AND OR REPAIR -RECONSTRUCTION ;  Surgeon: Bradly Bienenstock, MD;  Location: [facility];  Service: Orthopedics;  Laterality: Left;   TOTAL ABDOMINAL HYSTERECTOMY  1994   w/  Bilateral Salpinoophorectomy   VENTRICULOPERITONEAL SHUNT N/A 06/23/2017   Procedure: VENTRICULAR-PERITONEAL SHUNT INSERTION WITH ABDOMINAL LAPARASCOPY AND BRAINLAB NAVIGATION;  Surgeon: Donalee Citrin, MD;  Location: [facility] OR;  Service: Neurosurgery;  Laterality: N/A;   Patient Active Problem List   Diagnosis Date Noted   Chronic dyspnea 09/04/2022   Chest pain on breathing 04/23/2022   Dizziness 02/05/2022   AKI (acute kidney injury) (HCC) 06/22/2021   Occipital neuralgia of right side 05/01/2021   Sinobronchitis 03/07/2021   Eczema 02/19/2021   Flank pain with history of urolithiasis 11/29/2020   Right  flank pain 10/16/2020   Shingles 09/02/2020   URI (upper respiratory infection) 08/21/2020   Elevated blood pressure reading 02/13/2020   Abnormal urinalysis 02/13/2020   Nephrolithiasis 02/08/2020   Hematuria 11/26/2019   Other fatigue 11/26/2019   Bronchitis 09/12/2019   Syncopal episodes 07/26/2019   COVID-19 07/26/2019   Viral illness 07/19/2019   HNP (herniated nucleus pulposus), lumbar 03/01/2019   Cubital tunnel syndrome on left 03/17/2018   Plantar fasciitis of left foot 03/17/2018   CSF leak 06/23/2017   Trochanteric bursitis, left hip 06/09/2017   Pseudotumor cerebri 03/31/2017   Obesity 09/15/2016   Abnormal weight gain 09/03/2016   Chiari I malformation (HCC) 07/09/2016   Prediabetes 02/21/2016   RLS (restless legs syndrome) 10/23/2015   Seasonal allergies 10/23/2015   Hyperlipidemia 07/17/2015   Osteoarthritis of carpometacarpal joint of left thumb 06/28/2015  Fibromyalgia 05/30/2015   Chronic back pain 05/09/2015   Therapeutic opioid-induced constipation (OIC) 01/31/2015   Insomnia 07/09/2014   Generalized anxiety disorder 04/03/2014   Spider angioma 10/11/2013   Chronic venous insufficiency 09/25/2013   Symptomatic mammary hypertrophy 05/30/2013   Nevus, non-neoplastic 08/01/2012   Pain syndrome, chronic 06/20/2012   Postlaminectomy syndrome of lumbar region 06/20/2012   Primary osteoarthritis of both knees 02/08/2012   MDD (major depressive disorder) 01/28/2012   Subluxation of peroneal tendon of right foot 01/28/2012   GERD (gastroesophageal reflux disease) 01/28/2012   Complication of gastric band procedure 08/26/2011    PCP: Everrett Coombe, DO  REFERRING PROVIDER: Everrett Coombe, DO  REFERRING DIAG: M54.2 (ICD-10-CM) - Neck pain R42 (ICD-10-CM) - Dizziness  THERAPY DIAG:  Dizziness and giddiness  Abnormal posture  Cervicalgia  Other muscle spasm  Rationale for Evaluation and Treatment: Rehabilitation  ONSET DATE: ~6  months  SUBJECTIVE:                                                                                                                                                                                                         SUBJECTIVE STATEMENT: Patient reports no change in vestibular symptoms, states she has mild pain in neck when rotating head (I.e. driving).  Hand dominance: Right  PERTINENT HISTORY:  Pt states her neck and back are fused; history of cyst in her spine, shunt  PAIN:  Are you having pain? Yes: NPRS scale: 1-2 currently (baseline)/10 Pain location: Neck Pain description: Stiffness/ache/dull Aggravating factors: Constant and chronic Relieving factors: Ice  PRECAUTIONS: Fall  WEIGHT BEARING RESTRICTIONS: No  FALLS:  Has patient fallen in last 6 months? No  LIVING ENVIRONMENT: Lives with: lives with their family daughter (72 y/o) and mom Lives in: House/apartment Stairs: No Has following equipment at home: None  OCCUPATION: On disability; likes to ride bike and work out in the yard  PLOF: Independent  PATIENT GOALS: Improve dizziness  NEXT MD VISIT: Cardiologist 10/06/22  OBJECTIVE:   DIAGNOSTIC FINDINGS:  CXR 07/21/22: (-) CT cervical 10/18/21 IMPRESSION: 1. No acute osseous abnormality or evidence of significant degenerative spondylosis of the cervical spine. 2. Prior C5-6 ACDF with solid interbody fusion. 3. Unchanged congenital findings spinal dysraphism. Grossly stable right C7 perineural root sleeve cyst.  PATIENT SURVEYS:  DHI 34%  COGNITION: Overall cognitive status: Within functional limits for tasks assessed  SENSATION: WFL  POSTURE: rounded shoulders and forward head  PALPATION: TTP R>L suboccipitals, R anterior scalene, bilat UTs   CERVICAL ROM:   Active ROM A/PROM (deg) eval  Flexion 30  Extension 50 "feels weird"  Right lateral flexion 40  Left lateral flexion 30  Right rotation 40  Left rotation 52   (Blank rows = not  tested)  UPPER EXTREMITY ROM: WFL grossly  UPPER EXTREMITY MMT: WFL grossly  CERVICAL SPECIAL TESTS:  Neck flexor muscle endurance test: Positive Vertebral artery test: Need to check  FUNCTIONAL TESTS:  Did not assess  VESTIBULAR ASSESSMENT:  GENERAL OBSERVATION: See above   SYMPTOM BEHAVIOR:  Subjective history: See above  Non-Vestibular symptoms: changes in vision, neck pain, and headaches  Type of dizziness: "Funny feeling in the head" and "Swimmyheaded"  Frequency: Daily; multiple times during the day (can be up to 10 times or more)  Duration: Short/brief  Aggravating factors: Spontaneous and Induced by position change: floor to standing  Relieving factors: rest and slow movements  Progression of symptoms: worse  OCULOMOTOR EXAM:  Ocular Alignment: normal  Ocular ROM: No Limitations  Spontaneous Nystagmus: absent  Gaze-Induced Nystagmus:  WFL  Smooth Pursuits:  mild corrective saccades with R inferior gaze but other no nystagmus  Saccades:  Mild symptoms with horizontal saccades  Convergence/Divergence: did not assess  Head shaking: WFL  FRENZEL - FIXATION SUPRESSED: Unable to test  VESTIBULAR - OCULAR REFLEX:   Slow VOR: Positive Right  VOR Cancellation: Comment: Mild symptoms with R head turn  Head-Impulse Test: HIT Right: negative HIT Left: negative  Dynamic Visual Acuity:  TBA  Fast VOR: No symptoms or issues horizontal or vertical   POSITIONAL TESTING: Did not assess   MOTION SENSITIVITY:    Motion Sensitivity Quotient Intensity: 0 = none, 1 = Lightheaded, 2 = Mild, 3 = Moderate, 4 = Severe, 5 = Vomiting  Intensity  1. Sitting to supine   2. Supine to L side   3. Supine to R side   4. Supine to sitting   5. L Hallpike-Dix   6. Up from L    7. R Hallpike-Dix   8. Up from R    9. Sitting, head  tipped to L knee   10. Head up from L  knee   11. Sitting, head  tipped to R knee   12. Head up from R  knee   13. Sitting head turns x5    14.Sitting head nods x5   15. In stance, 180  turn to L    16. In stance, 180  turn to R     OTHOSTATICS:  After 5 min in supine: 134/88; 84 BPM Initial Sitting: 144/99; 91 BPM After 5 min in sitting: 128/96; 88 BPM Initial Standing: 131/97; 98 BPM After 5 min standing: 130/97; 106 BPM  FUNCTIONAL GAIT:  FGA: TBA    TODAY'S TREATMENT:   [facility] Adult PT Treatment:                                                DATE: 09/28/2022 Therapeutic Exercise: Doorway pec stretch 2x30" (mid) Seated with noodle: Scap squeezes No monies RTB x10 "W" YTB x10 Corner pec stretch Manual Therapy: STM (focus on R) SCM, anterior scalenes, pecs --> passive pec stretch Neuromuscular re-ed: Saccades (yaw) - [email or web address] (3x30")   [facility] Adult PT Treatment:  DATE: 09/23/22 Therapeutic Exercise: SCM stretch 2x30 sec Cervical retraction 10x5 sec Cervical retraction + rotation x10 Shoulder ER red TB 2x10 "W" red TB 2x10 Doorway pec stretch x30 sec low and mid Manual Therapy: Pec minor/major STM & TPR Scar massage Neuromuscular re-ed: Seated VOR x 1, 100 bpm 2 x 30 sec, horziontal & vertical Seated saccades 2x30 sec horizontal & vertical Seated corrective saccades 2x30 sec horizontal, standing corrective saccades 2x30 sec    [facility] Adult PT Treatment:                                                DATE: 09/21/2022 Neuromuscular re-ed: Seated: Smooth pursuits Y/P Saccades Y/P: Y/P/diagonals VORx1 Y/P (focus on pitch plane): metronome 100 bpm x30" --> 80 bpm 5x30" Pin & stretch SCM Cervical retraction                                                                                                                              PATIENT EDUCATION:  Education details: HEP Person educated: Patient Education method: Medical illustrator Education comprehension: verbalized understanding and needs further  education  HOME EXERCISE PROGRAM: Access Code: [email or web address] URL: [email or web address] Date: 09/23/2022 Prepared by: Vernon Prey April Kirstie Peri  Exercises - Sternocleidomastoid Stretch  - 1 x daily - 7 x weekly - 3 sets - 10 reps - Seated Gaze Stabilization with Head Rotation  - 1 x daily - 7 x weekly - 3 sets - 10 reps - Seated Gaze Stabilization with Head Nod  - 1 x daily - 7 x weekly - 3 sets - 10 reps - Seated Scapular Retraction  - 1 x daily - 7 x weekly - 2 sets - 10 reps - Seated Cervical Retraction  - 1 x daily - 7 x weekly - 2 sets - 10 reps - Doorway Pec Stretch at 60 Elevation  - 1 x daily - 7 x weekly - 2 sets - 30 sec hold - Doorway Pec Stretch at 90 Degrees Abduction  - 1 x daily - 7 x weekly - 2 sets - 30 sec hold   ASSESSMENT:  CLINICAL IMPRESSION: Initial increase in vestibular symptoms (5/10) with first round of optokinetic training with saccades in yaw plane; symptoms decreased to 1/10 by third round.   OBJECTIVE IMPAIRMENTS: decreased activity tolerance, decreased endurance, decreased ROM, dizziness, increased fascial restrictions, increased muscle spasms, postural dysfunction, and pain.     GOALS: Goals reviewed with patient? Yes  SHORT TERM GOALS: Target date: 09/30/2022  Pt will be ind with initial HEP Baseline:  Goal status: INITIAL  2.  Pt will report >/=25% decrease in frequency or intensity of her symptoms Baseline:  Goal status: INITIAL   LONG TERM GOALS: Target date: 10/28/2022  Pt will be ind with management and progression of HEP Baseline:  Goal status:  INITIAL  2.  Pt will have L = R cervical motion Baseline:  Goal status: INITIAL  3.  Pt will report >/=50% decrease in overall symptoms to be able to perform yard work Baseline:  Goal status: INITIAL  4.  Pt will have improved DHI to </=16% to demo MCID Baseline:  Goal status: INITIAL    PLAN:  PT FREQUENCY: 2x/week  PT DURATION: 6 weeks  PLANNED INTERVENTIONS:  Therapeutic exercises, Therapeutic activity, Neuromuscular re-education, Balance training, Gait training, Patient/Family education, Self Care, Joint mobilization, Stair training, Vestibular training, Canalith repositioning, Aquatic Therapy, Dry Needling, Electrical stimulation, Cryotherapy, Moist heat, Taping, Ultrasound, Ionotophoresis 4mg /ml Dexamethasone, Manual therapy, and Re-evaluation  PLAN FOR NEXT SESSION: Assess vertebral artery. Monitor BP. Work on cervical ROM/manual work for trigger point release. Deep neck flexor strengthening. Gaze stabilization.   Carlynn Herald, PTA 09/28/2022 8:48 AM

## 2022-10-02 ENCOUNTER — Ambulatory Visit: Payer: Medicare HMO

## 2022-10-06 ENCOUNTER — Encounter: Payer: Self-pay | Admitting: Cardiology

## 2022-10-06 ENCOUNTER — Ambulatory Visit: Payer: Medicare HMO | Attending: Cardiology | Admitting: Cardiology

## 2022-10-06 VITALS — BP 132/80 | HR 81 | Ht 63.0 in | Wt 152.2 lb

## 2022-10-06 DIAGNOSIS — R072 Precordial pain: Secondary | ICD-10-CM | POA: Diagnosis not present

## 2022-10-06 DIAGNOSIS — E7800 Pure hypercholesterolemia, unspecified: Secondary | ICD-10-CM

## 2022-10-06 DIAGNOSIS — R0600 Dyspnea, unspecified: Secondary | ICD-10-CM

## 2022-10-06 MED ORDER — METOPROLOL TARTRATE 100 MG PO TABS: ORAL_TABLET | ORAL | 0 refills | Status: DC

## 2022-10-06 NOTE — Patient Instructions (Signed)
  Testing/Procedures:   Your cardiac CT will be scheduled at   [facility] [address] , [place] [address] [phone]   If scheduled at [facility], please arrive at the Women's and Children's Entrance (Entrance C2) of [facility] 30 minutes prior to test start time. You can use the FREE valet parking offered at entrance C (encouraged to control the heart rate for the test)  Proceed to the [facility] (first floor) to check-in and test prep.  All radiology patients and guests should use entrance C2 at [facility], accessed from [address], even though the hospital's physical address listed is [address].      Please follow these instructions carefully (unless otherwise directed):   On the Night Before the Test: Be sure to Drink plenty of water. Do not consume any caffeinated/decaffeinated beverages or chocolate 12 hours prior to your test. Do not take any antihistamines 12 hours prior to your test.   On the Day of the Test: Drink plenty of water until 1 hour prior to the test. Do not eat any food 1 hour prior to test. You may take your regular medications prior to the test.  Take metoprolol (Lopressor) 100 mg two hours prior to test. FEMALES- please wear underwire-free bra if available, avoid dresses & tight clothing       After the Test: Drink plenty of water. After receiving IV contrast, you may experience a mild flushed feeling. This is normal. On occasion, you may experience a mild rash up to 24 hours after the test. This is not dangerous. If this occurs, you can take Benadryl 25 mg and increase your fluid intake. If you experience trouble breathing, this can be serious. If it is severe call 911 IMMEDIATELY. If it is mild, please call our office.  We will call to schedule your test 2-4 weeks out understanding that some insurance companies will need an authorization prior to  the service being performed.   For non-scheduling related questions, please contact the cardiac imaging nurse navigator should you have any questions/concerns: Sara Wallace, Cardiac Imaging Nurse Navigator Merle Prescott, Cardiac Imaging Nurse Navigator [facility] Direct Office Dial: [phone]   For scheduling needs, including cancellations and rescheduling, please call Brittany, [phone].    Follow-Up: At [facility], you and your health needs are our priority.  As part of our continuing mission to provide you with exceptional heart care, we have created designated Provider Care Teams.  These Care Teams include your primary Cardiologist (physician) and Advanced Practice Providers (APPs -  Physician Assistants and Nurse Practitioners) who all work together to provide you with the care you need, when you need it.  We recommend signing up for the patient portal called "MyChart".  Sign up information is provided on this After Visit Summary.  MyChart is used to connect with patients for Virtual Visits (Telemedicine).  Patients are able to view lab/test results, encounter notes, upcoming appointments, etc.  Non-urgent messages can be sent to your provider as well.   To learn more about what you can do with MyChart, go to [email or web address].    Your next appointment:   12 month(s)  Provider:   Brian Crenshaw MD    

## 2022-10-07 DIAGNOSIS — M65341 Trigger finger, right ring finger: Secondary | ICD-10-CM | POA: Diagnosis not present

## 2022-10-07 DIAGNOSIS — Z981 Arthrodesis status: Secondary | ICD-10-CM | POA: Diagnosis not present

## 2022-10-07 DIAGNOSIS — M5417 Radiculopathy, lumbosacral region: Secondary | ICD-10-CM | POA: Diagnosis not present

## 2022-10-07 DIAGNOSIS — M961 Postlaminectomy syndrome, not elsewhere classified: Secondary | ICD-10-CM | POA: Diagnosis not present

## 2022-10-07 DIAGNOSIS — M542 Cervicalgia: Secondary | ICD-10-CM | POA: Diagnosis not present

## 2022-10-15 ENCOUNTER — Telehealth (HOSPITAL_COMMUNITY): Payer: Self-pay | Admitting: Emergency Medicine

## 2022-10-15 NOTE — Telephone Encounter (Signed)
Reaching out to patient to offer assistance regarding upcoming cardiac imaging study; pt verbalizes understanding of appt date/time, parking situation and where to check in, pre-test NPO status and medications ordered, and verified current allergies; name and call back number provided for further questions should they arise Sharmin Foulk RN Navigator Cardiac Imaging [facility] [phone] office [phone] cell 

## 2022-10-16 ENCOUNTER — Ambulatory Visit (HOSPITAL_COMMUNITY): Admission: RE | Admit: 2022-10-16 | Payer: Medicare HMO | Source: Ambulatory Visit

## 2022-11-02 ENCOUNTER — Other Ambulatory Visit: Payer: Self-pay | Admitting: Family Medicine

## 2022-11-03 ENCOUNTER — Encounter: Payer: Self-pay | Admitting: Family Medicine

## 2022-11-03 MED ORDER — MOUNJARO 12.5 MG/0.5ML ~~LOC~~ SOAJ: 12.5000 mg | SUBCUTANEOUS | 0 refills | Status: DC

## 2022-11-05 ENCOUNTER — Encounter: Payer: Self-pay | Admitting: Family Medicine

## 2022-11-05 ENCOUNTER — Ambulatory Visit (INDEPENDENT_AMBULATORY_CARE_PROVIDER_SITE_OTHER): Payer: Medicare HMO | Admitting: Family Medicine

## 2022-11-05 VITALS — BP 127/85 | HR 87 | Ht 63.0 in | Wt 151.0 lb

## 2022-11-05 DIAGNOSIS — M5417 Radiculopathy, lumbosacral region: Secondary | ICD-10-CM | POA: Diagnosis not present

## 2022-11-05 DIAGNOSIS — M961 Postlaminectomy syndrome, not elsewhere classified: Secondary | ICD-10-CM | POA: Diagnosis not present

## 2022-11-05 DIAGNOSIS — M542 Cervicalgia: Secondary | ICD-10-CM | POA: Diagnosis not present

## 2022-11-05 DIAGNOSIS — R7303 Prediabetes: Secondary | ICD-10-CM | POA: Diagnosis not present

## 2022-11-05 DIAGNOSIS — F3341 Major depressive disorder, recurrent, in partial remission: Secondary | ICD-10-CM | POA: Diagnosis not present

## 2022-11-05 DIAGNOSIS — Z981 Arthrodesis status: Secondary | ICD-10-CM | POA: Diagnosis not present

## 2022-11-05 DIAGNOSIS — M50821 Other cervical disc disorders at C4-C5 level: Secondary | ICD-10-CM | POA: Diagnosis not present

## 2022-11-05 DIAGNOSIS — M4803 Spinal stenosis, cervicothoracic region: Secondary | ICD-10-CM | POA: Diagnosis not present

## 2022-11-05 DIAGNOSIS — M4802 Spinal stenosis, cervical region: Secondary | ICD-10-CM | POA: Diagnosis not present

## 2022-11-05 DIAGNOSIS — M50823 Other cervical disc disorders at C6-C7 level: Secondary | ICD-10-CM | POA: Diagnosis not present

## 2022-11-05 LAB — POCT GLYCOSYLATED HEMOGLOBIN (HGB A1C): HbA1c, POC (prediabetic range): 5.4 % — AB (ref 5.7–6.4)

## 2022-11-05 MED ORDER — DULOXETINE HCL 30 MG PO CPEP: ORAL_CAPSULE | ORAL | 0 refills | Status: DC

## 2022-11-05 NOTE — Assessment & Plan Note (Signed)
She would like to try weaning off of duloxetine.  Given instructions on how to cut back and wean from this over the next several weeks.  I will plan to see her back in about 2 months.  Will probably need to add something else back on.  Instructed her to follow-up sooner if she felt like she was having increasing symptoms.

## 2022-11-05 NOTE — Assessment & Plan Note (Signed)
Blood sugars remain well-controlled.  Continue Mounjaro at current strength.

## 2022-11-05 NOTE — Progress Notes (Signed)
Katrina Fernandez - 57 y.o. female MRN [id number]  Date of birth: 21-Dec-1965  Subjective Chief Complaint  Patient presents with   Depression    HPI Katrina Fernandez is a 57 y.o. female here today for follow up visit.    She is doing pretty well.  Remains on tirzepatide 12.5mg  weekly.  This continues to work pretty well for her.  Weight is fairly stable.  A1c is stable as well.  No issues with tolerance.  She is moderately active.  She feels that diet remains pretty good.    She is wanting to wean from cymbalta.  She thinks that this is not as effective as it has been in the past and she is having some sweats which she thinks may be related to this.  ROS:  A comprehensive ROS was completed and negative except as noted per HPI  Allergies  Allergen Reactions   Penicillins Anaphylaxis    11/28/20 - patient tolerated Keflex w/o adverse effects    Tape Rash    Plastic tape, silicone tape, Steri Strips   Bactrim [Sulfamethoxazole-Trimethoprim] Nausea Only   Paxlovid [Nirmatrelvir-Ritonavir] Nausea And Vomiting   Chlorhexidine Rash and Other (See Comments)    Dermatitis - Allergic    Past Medical History:  Diagnosis Date   Anemia    Anxiety    Arthritis    "back and knees" (07/23/2016)   Chiari malformation type I (HCC) dx'd 2014   Chronic back pain    buldging, DDD; "neck and lower back" (07/23/2016)   Chronic constipation    takes Miralax daily   Chronic pain syndrome    takes Methadone and Keppra daily   Depression    takes Effexor daily   Endometriosis    1995   Fibromyalgia    GERD (gastroesophageal reflux disease)    not taking anything at the present time   Headache    History of DVT of lower extremity 07/2012   LEFT LEG --  3/ 2014.Was on a blood transfusion for 6 months   History of kidney stones    History of MRSA infection 10 yrs ago   Hyperlipidemia    takes Simvastatin daily   Migraine    "daily recently" (07/23/2016)   Muscle spasm    takes Zanaflex  nightly   Peripheral edema    takes Lasix daily as needed   Peripheral neuropathy    Pleurisy 2000   Pneumonia    hx of-7-8 yrs ago   PONV (postoperative nausea and vomiting)    Seasonal allergies    takes Claritin daily   Spinal headache    "since 2002; still get them; try to manage it w/lying flat"    Past Surgical History:  Procedure Laterality Date   ANTERIOR CERVICAL DECOMP/DISCECTOMY FUSION  07/25/2009   C5 -- C6   APPENDECTOMY  age 72   APPLICATION OF CRANIAL NAVIGATION N/A 06/23/2017   Procedure: APPLICATION OF CRANIAL NAVIGATION;  Surgeon: Donalee Citrin, MD;  Location: [facility] OR;  Service: Neurosurgery;  Laterality: N/A;   BACK SURGERY     BRAIN SURGERY     VP shunt   BREAST REDUCTION SURGERY Bilateral 06/07/2013   Procedure: BILATERAL MAMMARY REDUCTION  (BREAST);  Surgeon: Wayland Denis, DO;  Location: [facility];  Service: Plastics;  Laterality: Bilateral;   BREAST REDUCTION SURGERY Bilateral 06/07/2013   Procedure:  LIPOSUCTION;  Surgeon: Wayland Denis, DO;  Location: [facility];  Service: Plastics;  Laterality: Bilateral;   CARPAL  TUNNEL RELEASE Bilateral 2001   COLONOSCOPY  X 2   "polyps were benign"   CYSTOSCOPY WITH RETROGRADE PYELOGRAM, URETEROSCOPY AND STENT PLACEMENT Left 05/2021   (in my chart)   DILATION AND CURETTAGE OF UTERUS     ESOPHAGOGASTRODUODENOSCOPY     gastric banding undone  11/2014   LAPAROSCOPIC CHOLECYSTECTOMY  ~ 2000   LAPAROSCOPIC GASTRIC BANDING  2011   LAPAROSCOPIC REVISION VENTRICULAR-PERITONEAL (V-P) SHUNT N/A 06/23/2017   Procedure: Shunt Placment stereotactic ventricular catheter placement and general surgery abdominal exposure  (Laparoscopic vs. Open) Brain Lab;  Surgeon: Berna Bue, MD;  Location: [facility] OR;  Service: General;  Laterality: N/A;   LAPAROSCOPY N/A 06/23/2017   Procedure: LAPAROSCOPY DIAGNOSTIC;  Surgeon: Berna Bue, MD;  Location: [facility] OR;  Service: General;  Laterality: N/A;   LUMBAR  LAMINECTOMY/DECOMPRESSION MICRODISCECTOMY N/A 07/23/2016   Procedure: Decompressive Lumbar Laminectomy Lumbar two-three  for Repair of  Cerberal Spinal Fluid  Leak;  Surgeon: Donalee Citrin, MD;  Location: [facility] OR;  Service: Neurosurgery;  Laterality: N/A;   LUMBAR LAMINECTOMY/DECOMPRESSION MICRODISCECTOMY Left 03/01/2019   Procedure: LEFT LUMBAR THREE-FOUR EXTRAFORAMINAL MICRODISCECTOMY;  Surgeon: Donalee Citrin, MD;  Location: [facility] OR;  Service: Neurosurgery;  Laterality: Left;   PLACEMENT LUMBOPERITONEAL SHUNT FOR CEREBROSPINAL FLUID DIVERSION AWAY FROM C8 PERINEURAL CYST  09-05-2003  &  12-24-2003   08-31-2003  REMOVAL SHUNT   POSTERIOR CERVICAL LAMINECTOMY  03/01/2001   C7-8 and Exploration C8 perineural cyst   POSTERIOR LUMBAR LAMINECTOMY L4-5/  DISKECTOMY L5-S1  05/20/2005   RE-DO DECOMPRESSION LAMINECTOMY AND FUSION L4-5  &  L5-S1  12/20/2006   02-04-2007--  RE-EXPLORATION L4 to S1, I & D LUMBAR WOUND HEMATOMA   REDUCTION MAMMAPLASTY     SHUNT REMOVAL N/A 07/10/2016   Procedure: SHUNT REMOVAL;  Surgeon: Donalee Citrin, MD;  Location: [facility] OR;  Service: Neurosurgery;  Laterality: N/A;  SHUNT REMOVAL   SPINAL CORD STIMULATOR INSERTION N/A 04/26/2015   Procedure: LUMBAR SPINAL CORD STIMULATOR INSERTION;  Surgeon: Odette Fraction, MD;  Location: [facility];  Service: Neurosurgery;  Laterality: N/A;  LUMBAR SPINAL CORD STIMULATOR INSERTION   SPINAL CORD STIMULATOR REMOVAL N/A 06/28/2019   Procedure: Removal of dorsal column stimulator and generator -;  Surgeon: Donalee Citrin, MD;  Location: [facility] OR;  Service: Neurosurgery;  Laterality: N/A;   TENNIS ELBOW RELEASE/NIRSCHEL PROCEDURE Left 12/13/2014   Procedure: LEFT ELBOW LATERAL EPICONDYLAR DEBRIDEMENT AND OR REPAIR -RECONSTRUCTION ;  Surgeon: Bradly Bienenstock, MD;  Location: [facility];  Service: Orthopedics;  Laterality: Left;   TOTAL ABDOMINAL HYSTERECTOMY  1994   w/  Bilateral Salpinoophorectomy   VENTRICULOPERITONEAL SHUNT N/A 06/23/2017    Procedure: VENTRICULAR-PERITONEAL SHUNT INSERTION WITH ABDOMINAL LAPARASCOPY AND BRAINLAB NAVIGATION;  Surgeon: Donalee Citrin, MD;  Location: [facility] OR;  Service: Neurosurgery;  Laterality: N/A;    Social History   Socioeconomic History   Marital status: Divorced    Spouse name: Not on file   Number of children: 2   Years of education: 14   Highest education level: Associate degree: academic program  Occupational History   Occupation: Disabled   Tobacco Use   Smoking status: Never   Smokeless tobacco: Never  Building services engineer Use: Never used  Substance and Sexual Activity   Alcohol use: Yes    Comment: once a year   Drug use: No   Sexual activity: Not Currently    Birth control/protection: Surgical  Other Topics Concern   Not on file  Social History  Narrative   Mrs Lybarger is divorced, on permanent disability and lives at home with her mother and her daughter. She is independent in her care needs. She denies concern with transportation to medical appointments She is on a fixed income. She enjoys crafting and quilting.   Social Determinants of Health   Financial Resource Strain: Low Risk  (09/04/2022)   Overall Financial Resource Strain (CARDIA)    Difficulty of Paying Living Expenses: Not very hard  Food Insecurity: No Food Insecurity (09/04/2022)   Hunger Vital Sign    Worried About Running Out of Food in the Last Year: Never true    Ran Out of Food in the Last Year: Never true  Transportation Needs: No Transportation Needs (09/04/2022)   PRAPARE - Administrator, Civil Service (Medical): No    Lack of Transportation (Non-Medical): No  Physical Activity: Inactive (09/04/2022)   Exercise Vital Sign    Days of Exercise per Week: 0 days    Minutes of Exercise per Session: 0 min  Stress: No Stress Concern Present (09/04/2022)   [employer] of Occupational Health - Occupational Stress Questionnaire    Feeling of Stress : Only a little  Recent Concern: Stress -  Stress Concern Present (08/03/2022)   [employer] of Occupational Health - Occupational Stress Questionnaire    Feeling of Stress : To some extent  Social Connections: Unknown (09/04/2022)   Social Connection and Isolation Panel [NHANES]    Frequency of Communication with Friends and Family: More than three times a week    Frequency of Social Gatherings with Friends and Family: Twice a week    Attends Religious Services: Patient declined    Database administrator or Organizations: Yes    Attends Banker Meetings: 1 to 4 times per year    Marital Status: Divorced  Recent Concern: Social Connections - Moderately Isolated (08/03/2022)   Social Connection and Isolation Panel [NHANES]    Frequency of Communication with Friends and Family: Three times a week    Frequency of Social Gatherings with Friends and Family: More than three times a week    Attends Religious Services: Never    Database administrator or Organizations: Yes    Attends Engineer, structural: More than 4 times per year    Marital Status: Divorced    Family History  Problem Relation Age of Onset   COPD Mother    Diabetes Mother    Asthma Mother    Diabetes Father    Colon cancer Father    Ovarian cancer Maternal Grandmother    Pancreatic cancer Maternal Grandfather    Depression Other     Health Maintenance  Topic Date Due   DTaP/Tdap/Td (2 - Td or Tdap) 05/19/2019   Zoster Vaccines- Shingrix (2 of 2) 02/05/2023 (Originally 04/02/2022)   COVID-19 Vaccine (3 - Moderna risk series) 02/20/2023 (Originally 12/05/2019)   Hepatitis C Screening  08/03/2023 (Originally 08/23/1985)   INFLUENZA VACCINE  12/17/2022   Colonoscopy  07/05/2023   Medicare Annual Wellness (AWV)  08/03/2023   MAMMOGRAM  08/29/2023   HIV Screening  Completed   HPV VACCINES  Aged Out      ----------------------------------------------------------------------------------------------------------------------------------------------------------------------------------------------------------------- Physical Exam BP 127/85 (BP Location: Left Arm, Patient Position: Sitting, Cuff Size: Normal)   Pulse 87   Ht 5\' 3"  (1.6 m)   Wt 151 lb (68.5 kg)   SpO2 97%   BMI 26.75 kg/m   Physical Exam Constitutional:  Appearance: Normal appearance.  HENT:     Head: Normocephalic and atraumatic.  Eyes:     General: No scleral icterus. Musculoskeletal:     Cervical back: Neck supple.  Neurological:     Mental Status: She is alert.     ------------------------------------------------------------------------------------------------------------------------------------------------------------------------------------------------------------------- Assessment and Plan  Prediabetes Blood sugars remain well-controlled.  Continue Mounjaro at current strength.  MDD (major depressive disorder) She would like to try weaning off of duloxetine.  Given instructions on how to cut back and wean from this over the next several weeks.  I will plan to see her back in about 2 months.  Will probably need to add something else back on.  Instructed her to follow-up sooner if she felt like she was having increasing symptoms.   Meds ordered this encounter  Medications   DULoxetine (CYMBALTA) 30 MG capsule    Sig: Take daily x2 weeks then decrease to every other day x2 weeks    Dispense:  30 capsule    Refill:  0    Return in about 2 months (around 01/05/2023) for F/u Anxiety after weaning form Cymbalta.    This visit occurred during the SARS-CoV-2 public health emergency.  Safety protocols were in place, including screening questions prior to the visit, additional usage of staff PPE, and extensive cleaning of exam room while observing appropriate contact time as indicated for disinfecting  solutions.

## 2022-11-05 NOTE — Patient Instructions (Signed)
Reduce duloxetine to 60mg  daily x2 weeks, then 30mg  daily x2 weeks, then 30mg  every other day x2 weeks.

## 2022-11-11 DIAGNOSIS — M65331 Trigger finger, right middle finger: Secondary | ICD-10-CM | POA: Diagnosis not present

## 2022-11-11 DIAGNOSIS — M65341 Trigger finger, right ring finger: Secondary | ICD-10-CM | POA: Diagnosis not present

## 2022-11-26 DIAGNOSIS — M25551 Pain in right hip: Secondary | ICD-10-CM | POA: Diagnosis not present

## 2022-11-26 DIAGNOSIS — Z79899 Other long term (current) drug therapy: Secondary | ICD-10-CM | POA: Diagnosis not present

## 2022-11-26 DIAGNOSIS — M961 Postlaminectomy syndrome, not elsewhere classified: Secondary | ICD-10-CM | POA: Diagnosis not present

## 2022-11-26 DIAGNOSIS — Z5181 Encounter for therapeutic drug level monitoring: Secondary | ICD-10-CM | POA: Diagnosis not present

## 2022-11-26 DIAGNOSIS — Z981 Arthrodesis status: Secondary | ICD-10-CM | POA: Diagnosis not present

## 2022-11-26 DIAGNOSIS — M542 Cervicalgia: Secondary | ICD-10-CM | POA: Diagnosis not present

## 2022-11-26 DIAGNOSIS — M5417 Radiculopathy, lumbosacral region: Secondary | ICD-10-CM | POA: Diagnosis not present

## 2022-11-27 ENCOUNTER — Other Ambulatory Visit: Payer: Self-pay | Admitting: Family Medicine

## 2022-12-04 ENCOUNTER — Other Ambulatory Visit: Payer: Self-pay | Admitting: Family Medicine

## 2022-12-08 ENCOUNTER — Other Ambulatory Visit: Payer: Self-pay | Admitting: Family Medicine

## 2022-12-10 DIAGNOSIS — M542 Cervicalgia: Secondary | ICD-10-CM | POA: Diagnosis not present

## 2022-12-10 DIAGNOSIS — R519 Headache, unspecified: Secondary | ICD-10-CM | POA: Diagnosis not present

## 2022-12-11 DIAGNOSIS — M25551 Pain in right hip: Secondary | ICD-10-CM | POA: Diagnosis not present

## 2022-12-17 NOTE — Therapy (Deleted)
OUTPATIENT PHYSICAL THERAPY CERVICAL EVALUATION   Patient Name: Katrina Fernandez MRN: [id number] DOB:03-Sep-1965, 57 y.o., female Today's Date: 12/17/2022  END OF SESSION:   Past Medical History:  Diagnosis Date   Anemia    Anxiety    Arthritis    "back and knees" (07/23/2016)   Chiari malformation type I (HCC) dx'd 2014   Chronic back pain    buldging, DDD; "neck and lower back" (07/23/2016)   Chronic constipation    takes Miralax daily   Chronic pain syndrome    takes Methadone and Keppra daily   Depression    takes Effexor daily   Endometriosis    1995   Fibromyalgia    GERD (gastroesophageal reflux disease)    not taking anything at the present time   Headache    History of DVT of lower extremity 07/2012   LEFT LEG --  3/ 2014.Was on a blood transfusion for 6 months   History of kidney stones    History of MRSA infection 10 yrs ago   Hyperlipidemia    takes Simvastatin daily   Migraine    "daily recently" (07/23/2016)   Muscle spasm    takes Zanaflex nightly   Peripheral edema    takes Lasix daily as needed   Peripheral neuropathy    Pleurisy 2000   Pneumonia    hx of-7-8 yrs ago   PONV (postoperative nausea and vomiting)    Seasonal allergies    takes Claritin daily   Spinal headache    "since 2002; still get them; try to manage it w/lying flat"   Past Surgical History:  Procedure Laterality Date   ANTERIOR CERVICAL DECOMP/DISCECTOMY FUSION  07/25/2009   C5 -- C6   APPENDECTOMY  age 19   APPLICATION OF CRANIAL NAVIGATION N/A 06/23/2017   Procedure: APPLICATION OF CRANIAL NAVIGATION;  Surgeon: Donalee Citrin, MD;  Location: [facility];  Service: Neurosurgery;  Laterality: N/A;   BACK SURGERY     BRAIN SURGERY     VP shunt   BREAST REDUCTION SURGERY Bilateral 06/07/2013   Procedure: BILATERAL MAMMARY REDUCTION  (BREAST);  Surgeon: Wayland Denis, DO;  Location: [facility];  Service: Plastics;  Laterality: Bilateral;   BREAST REDUCTION SURGERY  Bilateral 06/07/2013   Procedure:  LIPOSUCTION;  Surgeon: Wayland Denis, DO;  Location: [facility];  Service: Plastics;  Laterality: Bilateral;   CARPAL TUNNEL RELEASE Bilateral 2001   COLONOSCOPY  X 2   "polyps were benign"   CYSTOSCOPY WITH RETROGRADE PYELOGRAM, URETEROSCOPY AND STENT PLACEMENT Left 05/2021   (in my chart)   DILATION AND CURETTAGE OF UTERUS     ESOPHAGOGASTRODUODENOSCOPY     gastric banding undone  11/2014   LAPAROSCOPIC CHOLECYSTECTOMY  ~ 2000   LAPAROSCOPIC GASTRIC BANDING  2011   LAPAROSCOPIC REVISION VENTRICULAR-PERITONEAL (V-P) SHUNT N/A 06/23/2017   Procedure: Shunt Placment stereotactic ventricular catheter placement and general surgery abdominal exposure  (Laparoscopic vs. Open) Brain Lab;  Surgeon: Berna Bue, MD;  Location: [facility];  Service: General;  Laterality: N/A;   LAPAROSCOPY N/A 06/23/2017   Procedure: LAPAROSCOPY DIAGNOSTIC;  Surgeon: Berna Bue, MD;  Location: [facility];  Service: General;  Laterality: N/A;   LUMBAR LAMINECTOMY/DECOMPRESSION MICRODISCECTOMY N/A 07/23/2016   Procedure: Decompressive Lumbar Laminectomy Lumbar two-three  for Repair of  Cerberal Spinal Fluid  Leak;  Surgeon: Donalee Citrin, MD;  Location: [facility];  Service: Neurosurgery;  Laterality: N/A;   LUMBAR LAMINECTOMY/DECOMPRESSION MICRODISCECTOMY Left 03/01/2019   Procedure:  LEFT LUMBAR THREE-FOUR EXTRAFORAMINAL MICRODISCECTOMY;  Surgeon: Donalee Citrin, MD;  Location: [facility];  Service: Neurosurgery;  Laterality: Left;   PLACEMENT LUMBOPERITONEAL SHUNT FOR CEREBROSPINAL FLUID DIVERSION AWAY FROM C8 PERINEURAL CYST  09-05-2003  &  12-24-2003   08-31-2003  REMOVAL SHUNT   POSTERIOR CERVICAL LAMINECTOMY  03/01/2001   C7-8 and Exploration C8 perineural cyst   POSTERIOR LUMBAR LAMINECTOMY L4-5/  DISKECTOMY L5-S1  05/20/2005   RE-DO DECOMPRESSION LAMINECTOMY AND FUSION L4-5  &  L5-S1  12/20/2006   02-04-2007--  RE-EXPLORATION L4 to S1, I & D LUMBAR WOUND HEMATOMA   REDUCTION  MAMMAPLASTY     SHUNT REMOVAL N/A 07/10/2016   Procedure: SHUNT REMOVAL;  Surgeon: Donalee Citrin, MD;  Location: [facility];  Service: Neurosurgery;  Laterality: N/A;  SHUNT REMOVAL   SPINAL CORD STIMULATOR INSERTION N/A 04/26/2015   Procedure: LUMBAR SPINAL CORD STIMULATOR INSERTION;  Surgeon: Odette Fraction, MD;  Location: [facility];  Service: Neurosurgery;  Laterality: N/A;  LUMBAR SPINAL CORD STIMULATOR INSERTION   SPINAL CORD STIMULATOR REMOVAL N/A 06/28/2019   Procedure: Removal of dorsal column stimulator and generator -;  Surgeon: Donalee Citrin, MD;  Location: [facility] OR;  Service: Neurosurgery;  Laterality: N/A;   TENNIS ELBOW RELEASE/NIRSCHEL PROCEDURE Left 12/13/2014   Procedure: LEFT ELBOW LATERAL EPICONDYLAR DEBRIDEMENT AND OR REPAIR -RECONSTRUCTION ;  Surgeon: Bradly Bienenstock, MD;  Location: [facility];  Service: Orthopedics;  Laterality: Left;   TOTAL ABDOMINAL HYSTERECTOMY  1994   w/  Bilateral Salpinoophorectomy   VENTRICULOPERITONEAL SHUNT N/A 06/23/2017   Procedure: VENTRICULAR-PERITONEAL SHUNT INSERTION WITH ABDOMINAL LAPARASCOPY AND BRAINLAB NAVIGATION;  Surgeon: Donalee Citrin, MD;  Location: [facility];  Service: Neurosurgery;  Laterality: N/A;   Patient Active Problem List   Diagnosis Date Noted   Chronic dyspnea 09/04/2022   Chest pain on breathing 04/23/2022   Dizziness 02/05/2022   AKI (acute kidney injury) (HCC) 06/22/2021   Occipital neuralgia of right side 05/01/2021   Sinobronchitis 03/07/2021   Eczema 02/19/2021   Flank pain with history of urolithiasis 11/29/2020   Shingles 09/02/2020   URI (upper respiratory infection) 08/21/2020   Elevated blood pressure reading 02/13/2020   Abnormal urinalysis 02/13/2020   Nephrolithiasis 02/08/2020   Hematuria 11/26/2019   Other fatigue 11/26/2019   Bronchitis 09/12/2019   COVID-19 07/26/2019   HNP (herniated nucleus pulposus), lumbar 03/01/2019   Cubital tunnel syndrome on left 03/17/2018   Plantar fasciitis of left foot  03/17/2018   CSF leak 06/23/2017   Trochanteric bursitis, left hip 06/09/2017   Pseudotumor cerebri 03/31/2017   Obesity 09/15/2016   Abnormal weight gain 09/03/2016   Chiari I malformation (HCC) 07/09/2016   Prediabetes 02/21/2016   RLS (restless legs syndrome) 10/23/2015   Seasonal allergies 10/23/2015   Hyperlipidemia 07/17/2015   Osteoarthritis of carpometacarpal joint of left thumb 06/28/2015   Fibromyalgia 05/30/2015   Chronic back pain 05/09/2015   Therapeutic opioid-induced constipation (OIC) 01/31/2015   Insomnia 07/09/2014   Generalized anxiety disorder 04/03/2014   Spider angioma 10/11/2013   Chronic venous insufficiency 09/25/2013   Symptomatic mammary hypertrophy 05/30/2013   Nevus, non-neoplastic 08/01/2012   Pain syndrome, chronic 06/20/2012   Postlaminectomy syndrome of lumbar region 06/20/2012   Primary osteoarthritis of both knees 02/08/2012   MDD (major depressive disorder) 01/28/2012   Subluxation of peroneal tendon of right foot 01/28/2012   GERD (gastroesophageal reflux disease) 01/28/2012   Complication of gastric band procedure 08/26/2011    PCP: Dr Everrett Coombe  REFERRING PROVIDER: Dr Jillyn Hidden  Cram  REFERRING DIAG: Cervicalgia   THERAPY DIAG:  No diagnosis found.  Rationale for Evaluation and Treatment: Rehabilitation  ONSET DATE: ***  SUBJECTIVE:                                                                                                                                                                                                         SUBJECTIVE STATEMENT: *** Hand dominance: {MISC; OT HAND DOMINANCE:[id number]}  PERTINENT HISTORY:  Pt states she was having SOB and dizziness since the first of the year. It progressively worsened. She had lung CT  and cardiology workup. Pt reports she can be sitting at home doing nothing and can feel dizzy. Feels it is "whoozy." Pt states when she transfers from the ground to standing (like in the yard)  she can feel dizzy. Pt states it sometimes affects her balance. Pt reports the R anterior neck is very tender and has a shunt on the side. Has chronic neck tightness   PAIN:  Are you having pain? Yes: NPRS scale: ***/10 Pain location: *** Pain description: *** Aggravating factors: *** Relieving factors: ***  PRECAUTIONS: {Therapy precautions:24002}  RED FLAGS: None     WEIGHT BEARING RESTRICTIONS: No  FALLS:  Has patient fallen in last 6 months? No  LIVING ENVIRONMENT: Lives with: lives with their family Lives in: House/apartment Stairs: No Has following equipment at home: None  OCCUPATION: On disability; likes to ride bike and work out in the yard   PLOF: Independent  PATIENT GOALS: ***  NEXT MD VISIT: ***  OBJECTIVE:   DIAGNOSTIC FINDINGS:  DIAGNOSTIC FINDINGS:  CXR 07/21/22: (-) CT cervical 10/18/21 IMPRESSION: 1. No acute osseous abnormality or evidence of significant degenerative spondylosis of the cervical spine. 2. Prior C5-6 ACDF with solid interbody fusion. 3. Unchanged congenital findings spinal dysraphism. Grossly stable right C7 perineural root sleeve cyst.  PATIENT SURVEYS:  FOTO   COGNITION: Overall cognitive status: Within functional limits for tasks assessed  SENSATION: {sensation:27233}  POSTURE: Patient presents with head forward posture with increased thoracic kyphosis; shoulders rounded and elevated; scapulae abducted and rotated along the thoracic spine; head of the humerus anterior in orientation.   PALPATION: ***   CERVICAL ROM:   Active ROM A/PROM (deg) eval  Flexion   Extension   Right lateral flexion   Left lateral flexion   Right rotation   Left rotation    (Blank rows = not tested)  UPPER EXTREMITY ROM:  Active ROM Right eval Left eval  Shoulder flexion    Shoulder extension    Shoulder  abduction    Shoulder adduction    Shoulder extension    Shoulder internal rotation    Shoulder external rotation    Elbow  flexion    Elbow extension    Wrist flexion    Wrist extension    Wrist ulnar deviation    Wrist radial deviation    Wrist pronation    Wrist supination     (Blank rows = not tested)  UPPER EXTREMITY MMT:  MMT Right eval Left eval  Shoulder flexion    Shoulder extension    Shoulder abduction    Shoulder adduction    Shoulder extension    Shoulder internal rotation    Shoulder external rotation    Middle trapezius    Lower trapezius    Elbow flexion    Elbow extension    Wrist flexion    Wrist extension    Wrist ulnar deviation    Wrist radial deviation    Wrist pronation    Wrist supination    Grip strength     (Blank rows = not tested)   [facility] Adult PT Treatment:                                                DATE: *** Therapeutic Exercise: *** Manual Therapy: *** Neuromuscular re-ed: *** Therapeutic Activity: *** Gait: *** Modalities: *** Self Care: ***   PATIENT EDUCATION:  Education details: POC; HEP Person educated: Patient Education method: Programmer, multimedia, Facilities manager, Actor cues, Verbal cues, and Handouts Education comprehension: verbalized understanding, returned demonstration, verbal cues required, tactile cues required, and needs further education  HOME EXERCISE PROGRAM: ***  ASSESSMENT:  CLINICAL IMPRESSION: Patient is a 57 y.o. female who was seen today for physical therapy evaluation and treatment for cervicalgia.    From prior evaluation for dizziness 09/16/22: CLINICAL IMPRESSION: Patient is a 57 y.o. F who was seen today for physical therapy evaluation and treatment for neck pain and dizziness. Pt with chronic neck pain/stiffness. PMH significant for ACDF C5-6, cyst along C7 and a shunt (along R side of neck). Oculomotor movements appear grossly WFL; however, mild saccades notable with end range of R inferior gaze. Symptoms replicated during slow VOR (pt utilizing full neck rotation). Pt demos decreased neck ROM and tender  suboccipitals which may be contributing to her dizzy symptoms. BP appears WFL but pt's HR appears to increase greatly upon standing. Pt will be getting cardio work up in the next 2-3 weeks. PT diagnosis is difficult to establish due to possible cardiac factors and possible effects of her shunt/chronic neck issues; however, discussed with pt trial of PT since we were able to replicate some of her dizziness today and there may be a cervical muscle component.   OBJECTIVE IMPAIRMENTS: decreased activity tolerance, decreased mobility, decreased ROM, decreased strength, dizziness, hypomobility, increased fascial restrictions, increased muscle spasms, impaired flexibility, impaired UE functional use, improper body mechanics, postural dysfunction, and pain.   ACTIVITY LIMITATIONS: carrying, lifting, sitting, standing, squatting, sleeping, stairs, transfers, bed mobility, and reach over head  PARTICIPATION LIMITATIONS: meal prep, cleaning, laundry, shopping, and community activity  PERSONAL FACTORS: Behavior pattern, Education, Fitness, Time since onset of injury/illness/exacerbation, and  comorbidities: including cervical dysfunction, dizziness, musculoskeletal injuries/dysfunction are also affecting patient's functional outcome.   REHAB POTENTIAL: Good  CLINICAL DECISION MAKING: Stable/uncomplicated  EVALUATION COMPLEXITY: Low   GOALS: Goals reviewed  with patient? Yes  SHORT TERM GOALS: Target date: ***  Independent in iniital HEP  Baseline:  Goal status: INITIAL  2.  *** Baseline:  Goal status: INITIAL    LONG TERM GOALS: Target date: ***  *** Baseline:  Goal status: INITIAL  2.  *** Baseline:  Goal status: INITIAL  3.  *** Baseline:  Goal status: INITIAL  4.  *** Baseline:  Goal status: INITIAL  5.  *** Baseline:  Goal status: INITIAL  6.  *** Baseline:  Goal status: INITIAL   PLAN:  PT FREQUENCY: 2x/week  PT DURATION: 12 weeks  PLANNED INTERVENTIONS:  Therapeutic exercises, Therapeutic activity, Neuromuscular re-education, Patient/Family education, Self Care, Joint mobilization, Aquatic Therapy, Dry Needling, Spinal mobilization, Cryotherapy, Moist heat, Taping, Ultrasound, Ionotophoresis 4mg /ml Dexamethasone, Manual therapy, and Re-evaluation  PLAN FOR NEXT SESSION: review and progress exercises; continue with spine care education; manual work, DN, modalities as indicated    [employer], PT 12/17/2022, 2:44 PM

## 2022-12-19 ENCOUNTER — Other Ambulatory Visit: Payer: Self-pay | Admitting: Family Medicine

## 2022-12-21 ENCOUNTER — Ambulatory Visit: Payer: Medicare HMO | Admitting: Rehabilitative and Restorative Service Providers"

## 2022-12-22 DIAGNOSIS — H524 Presbyopia: Secondary | ICD-10-CM | POA: Diagnosis not present

## 2022-12-22 DIAGNOSIS — Z135 Encounter for screening for eye and ear disorders: Secondary | ICD-10-CM | POA: Diagnosis not present

## 2022-12-22 NOTE — Therapy (Unsigned)
OUTPATIENT PHYSICAL THERAPY CERVICAL EVALUATION   Patient Name: Katrina Fernandez MRN: [id number] DOB:Apr 22, 1966, 57 y.o., female Today's Date: 12/22/2022  END OF SESSION:   Past Medical History:  Diagnosis Date   Anemia    Anxiety    Arthritis    "back and knees" (07/23/2016)   Chiari malformation type I (HCC) dx'd 2014   Chronic back pain    buldging, DDD; "neck and lower back" (07/23/2016)   Chronic constipation    takes Miralax daily   Chronic pain syndrome    takes Methadone and Keppra daily   Depression    takes Effexor daily   Endometriosis    1995   Fibromyalgia    GERD (gastroesophageal reflux disease)    not taking anything at the present time   Headache    History of DVT of lower extremity 07/2012   LEFT LEG --  3/ 2014.Was on a blood transfusion for 6 months   History of kidney stones    History of MRSA infection 10 yrs ago   Hyperlipidemia    takes Simvastatin daily   Migraine    "daily recently" (07/23/2016)   Muscle spasm    takes Zanaflex nightly   Peripheral edema    takes Lasix daily as needed   Peripheral neuropathy    Pleurisy 2000   Pneumonia    hx of-7-8 yrs ago   PONV (postoperative nausea and vomiting)    Seasonal allergies    takes Claritin daily   Spinal headache    "since 2002; still get them; try to manage it w/lying flat"   Past Surgical History:  Procedure Laterality Date   ANTERIOR CERVICAL DECOMP/DISCECTOMY FUSION  07/25/2009   C5 -- C6   APPENDECTOMY  age 16   APPLICATION OF CRANIAL NAVIGATION N/A 06/23/2017   Procedure: APPLICATION OF CRANIAL NAVIGATION;  Surgeon: Donalee Citrin, MD;  [facility] OR;  Service: Neurosurgery;  Laterality: N/A;   BACK SURGERY     BRAIN SURGERY     VP shunt   BREAST REDUCTION SURGERY Bilateral 06/07/2013   Procedure: BILATERAL MAMMARY REDUCTION  (BREAST);  Surgeon: Wayland Denis, DO;  Location: [facility];  Service: Plastics;  Laterality: Bilateral;   BREAST REDUCTION SURGERY  Bilateral 06/07/2013   Procedure:  LIPOSUCTION;  Surgeon: Wayland Denis, DO;  Location: [facility];  Service: Plastics;  Laterality: Bilateral;   CARPAL TUNNEL RELEASE Bilateral 2001   COLONOSCOPY  X 2   "polyps were benign"   CYSTOSCOPY WITH RETROGRADE PYELOGRAM, URETEROSCOPY AND STENT PLACEMENT Left 05/2021   (in my chart)   DILATION AND CURETTAGE OF UTERUS     ESOPHAGOGASTRODUODENOSCOPY     gastric banding undone  11/2014   LAPAROSCOPIC CHOLECYSTECTOMY  ~ 2000   LAPAROSCOPIC GASTRIC BANDING  2011   LAPAROSCOPIC REVISION VENTRICULAR-PERITONEAL (V-P) SHUNT N/A 06/23/2017   Procedure: Shunt Placment stereotactic ventricular catheter placement and general surgery abdominal exposure  (Laparoscopic vs. Open) Brain Lab;  Surgeon: Berna Bue, MD;  Location: [facility] OR;  Service: General;  Laterality: N/A;   LAPAROSCOPY N/A 06/23/2017   Procedure: LAPAROSCOPY DIAGNOSTIC;  Surgeon: Berna Bue, MD;  Location: [facility] OR;  Service: General;  Laterality: N/A;   LUMBAR LAMINECTOMY/DECOMPRESSION MICRODISCECTOMY N/A 07/23/2016   Procedure: Decompressive Lumbar Laminectomy Lumbar two-three  for Repair of  Cerberal Spinal Fluid  Leak;  Surgeon: Donalee Citrin, MD;  Location: [facility] OR;  Service: Neurosurgery;  Laterality: N/A;   LUMBAR LAMINECTOMY/DECOMPRESSION MICRODISCECTOMY Left 03/01/2019   Procedure:  LEFT LUMBAR THREE-FOUR EXTRAFORAMINAL MICRODISCECTOMY;  Surgeon: Donalee Citrin, MD;  Location: [facility] OR;  Service: Neurosurgery;  Laterality: Left;   PLACEMENT LUMBOPERITONEAL SHUNT FOR CEREBROSPINAL FLUID DIVERSION AWAY FROM C8 PERINEURAL CYST  09-05-2003  &  12-24-2003   08-31-2003  REMOVAL SHUNT   POSTERIOR CERVICAL LAMINECTOMY  03/01/2001   C7-8 and Exploration C8 perineural cyst   POSTERIOR LUMBAR LAMINECTOMY L4-5/  DISKECTOMY L5-S1  05/20/2005   RE-DO DECOMPRESSION LAMINECTOMY AND FUSION L4-5  &  L5-S1  12/20/2006   02-04-2007--  RE-EXPLORATION L4 to S1, I & D LUMBAR WOUND HEMATOMA   REDUCTION  MAMMAPLASTY     SHUNT REMOVAL N/A 07/10/2016   Procedure: SHUNT REMOVAL;  Surgeon: Donalee Citrin, MD;  Location: [facility] OR;  Service: Neurosurgery;  Laterality: N/A;  SHUNT REMOVAL   SPINAL CORD STIMULATOR INSERTION N/A 04/26/2015   Procedure: LUMBAR SPINAL CORD STIMULATOR INSERTION;  Surgeon: Odette Fraction, MD;  Location: [facility];  Service: Neurosurgery;  Laterality: N/A;  LUMBAR SPINAL CORD STIMULATOR INSERTION   SPINAL CORD STIMULATOR REMOVAL N/A 06/28/2019   Procedure: Removal of dorsal column stimulator and generator -;  Surgeon: Donalee Citrin, MD;  Location: [facility] OR;  Service: Neurosurgery;  Laterality: N/A;   TENNIS ELBOW RELEASE/NIRSCHEL PROCEDURE Left 12/13/2014   Procedure: LEFT ELBOW LATERAL EPICONDYLAR DEBRIDEMENT AND OR REPAIR -RECONSTRUCTION ;  Surgeon: Bradly Bienenstock, MD;  Location: [facility];  Service: Orthopedics;  Laterality: Left;   TOTAL ABDOMINAL HYSTERECTOMY  1994   w/  Bilateral Salpinoophorectomy   VENTRICULOPERITONEAL SHUNT N/A 06/23/2017   Procedure: VENTRICULAR-PERITONEAL SHUNT INSERTION WITH ABDOMINAL LAPARASCOPY AND BRAINLAB NAVIGATION;  Surgeon: Donalee Citrin, MD;  Location: [facility] OR;  Service: Neurosurgery;  Laterality: N/A;   Patient Active Problem List   Diagnosis Date Noted   Chronic dyspnea 09/04/2022   Chest pain on breathing 04/23/2022   Dizziness 02/05/2022   AKI (acute kidney injury) (HCC) 06/22/2021   Occipital neuralgia of right side 05/01/2021   Sinobronchitis 03/07/2021   Eczema 02/19/2021   Flank pain with history of urolithiasis 11/29/2020   Shingles 09/02/2020   URI (upper respiratory infection) 08/21/2020   Elevated blood pressure reading 02/13/2020   Abnormal urinalysis 02/13/2020   Nephrolithiasis 02/08/2020   Hematuria 11/26/2019   Other fatigue 11/26/2019   Bronchitis 09/12/2019   COVID-19 07/26/2019   HNP (herniated nucleus pulposus), lumbar 03/01/2019   Cubital tunnel syndrome on left 03/17/2018   Plantar fasciitis of left foot  03/17/2018   CSF leak 06/23/2017   Trochanteric bursitis, left hip 06/09/2017   Pseudotumor cerebri 03/31/2017   Obesity 09/15/2016   Abnormal weight gain 09/03/2016   Chiari I malformation (HCC) 07/09/2016   Prediabetes 02/21/2016   RLS (restless legs syndrome) 10/23/2015   Seasonal allergies 10/23/2015   Hyperlipidemia 07/17/2015   Osteoarthritis of carpometacarpal joint of left thumb 06/28/2015   Fibromyalgia 05/30/2015   Chronic back pain 05/09/2015   Therapeutic opioid-induced constipation (OIC) 01/31/2015   Insomnia 07/09/2014   Generalized anxiety disorder 04/03/2014   Spider angioma 10/11/2013   Chronic venous insufficiency 09/25/2013   Symptomatic mammary hypertrophy 05/30/2013   Nevus, non-neoplastic 08/01/2012   Pain syndrome, chronic 06/20/2012   Postlaminectomy syndrome of lumbar region 06/20/2012   Primary osteoarthritis of both knees 02/08/2012   MDD (major depressive disorder) 01/28/2012   Subluxation of peroneal tendon of right foot 01/28/2012   GERD (gastroesophageal reflux disease) 01/28/2012   Complication of gastric band procedure 08/26/2011    PCP: Dr Everrett Coombe  REFERRING PROVIDER: Dr Jillyn Hidden  Cram  REFERRING DIAG: Cervicalgia  THERAPY DIAG:  No diagnosis found.  Rationale for Evaluation and Treatment: Rehabilitation  ONSET DATE: 06/18/22  SUBJECTIVE:                                                                                                                                                                                                         SUBJECTIVE STATEMENT: Patient reports that she had MRI of neck from the pain clinic and showed degenerative changes. She was seen for 3-4 visits in May with no change in symptoms. Some days are worse than others. She has a cyst at C7 which is inoperable. She has underwent cervical fusion ~ 1999 above the C7 level. She has had neck pain for > 25 years. She had a LP shunt to address fluid in the cyst. She had  a VP shunt ~ 5 years which has helped the headaches. About 6 months ago she had increased neck pain and a feeling of swelling in the back of her neck and difficulty putting her head back. She has occasional pain in the L UE several days a week.    Hand dominance: Right  PERTINENT HISTORY:  Resection of R first rib ~ 40 yrs ago; Trigger fingers R long and ring fingers; LBP with fusion lumbar spine ~ 10 yrs ago; cervical fusion; VP shunt; hysterectomy; gall bladder; L elbow surgery for torn tendon; anxiety; depression  PAIN:  Are you having pain? Yes: NPRS scale: 4/10 Pain location: back of neck  Pain description: aching throbbing  Aggravating factors: looking up; increased activities   Relieving factors: ice; lying down; meds at times   PRECAUTIONS: Other: VP shunt R anterior/lateral cervical area   RED FLAGS: None     WEIGHT BEARING RESTRICTIONS: No  FALLS:  Has patient fallen in last 6 months? No  LIVING ENVIRONMENT: Lives with: lives with their family Lives in: House/apartment  OCCUPATION: retired on disability ~ 20 years  Household chores; 70 yr old daughter; walking ~ 3 times a week ~ 30 min  PLOF: Independent  PATIENT GOALS: decrease neck pain and avoid surgery   NEXT MD VISIT: 9/24  OBJECTIVE:   DIAGNOSTIC FINDINGS:  CT cervical spine 10/17/21: 1. No acute osseous abnormality or evidence of significant degenerative spondylosis of the cervical spine. 2. Prior C5-6 ACDF with solid interbody fusion. 3. Unchanged congenital findings spinal dysraphism. Grossly stable right C7 perineural root sleeve cyst.  PATIENT SURVEYS:  FOTO 40; goal 50   COGNITION: Overall cognitive status: Within functional limits for tasks assessed  SENSATION:  WFL  POSTURE: Patient presents with head forward posture with increased thoracic kyphosis; shoulders rounded and elevated; scapulae abducted and rotated along the thoracic spine; head of the humerus anterior in  orientation.   PALPATION: Significant muscular tightness R > L anterior/lateral/posterior cervical musculature; pecs; upper traps; leveator; teres; lats    CERVICAL ROM:   Active ROM A/PROM (deg) eval  Flexion 29 tight, pull  Extension 38 tight, pull  Right lateral flexion 27 tight   Left lateral flexion 25 tight   Right rotation 48  Left rotation 50   (Blank rows = not tested)  UPPER EXTREMITY ROM: end range tightness with elevation   Active ROM Right eval Left eval  Shoulder flexion [facility]  Shoulder extension    Shoulder abduction [facility]  Shoulder adduction    Shoulder extension    Shoulder internal rotation    Shoulder external rotation    Elbow flexion    Elbow extension    Wrist flexion    Wrist extension    Wrist ulnar deviation    Wrist radial deviation    Wrist pronation    Wrist supination     (Blank rows = not tested)  UPPER EXTREMITY MMT:  MMT Right eval Left eval  Shoulder flexion    Shoulder extension    Shoulder abduction    Shoulder adduction    Shoulder extension    Shoulder internal rotation    Shoulder external rotation    Middle trapezius 4- 4  Lower trapezius 4- 4  Elbow flexion    Elbow extension    Wrist flexion    Wrist extension    Wrist ulnar deviation    Wrist radial deviation    Wrist pronation    Wrist supination    Grip strength     (Blank rows = not tested)  CERVICAL SPECIAL TESTS:  Spurling's test: Negative, and Distraction test: Negative   [facility] Adult PT Treatment:                                                DATE: 12/23/22 Therapeutic Exercise: Standing Chin tuck with noodle 5 sec x 5 Scap squeeze with noodle 5 sec x 5  L's with noodle 3 sec x 10 W's with noodle 3 sec x 10  Corner stretch 3 positions 20-30 sec hold x 2 each position  Manual Therapy: Add trial of DN Neuromuscular re-ed: Postural education and correction  Therapeutic Activity: Myofacial ball release work standing 4 inch ball on wall   Modalities:  Self Care: Use of noodle for sitting  Ball release work   PATIENT EDUCATION:  Education details: HEP; POC Person educated: Patient Education method: Programmer, multimedia, Facilities manager, Actor cues, Verbal cues, and Handouts Education comprehension: verbalized understanding, returned demonstration, verbal cues required, tactile cues required, and needs further education  HOME EXERCISE PROGRAM: Access Code: [email or web address] URL: [email or web address] Date: 12/23/2022 Prepared by: Corlis Leak  Exercises - Seated Cervical Retraction  - 2 x daily - 7 x weekly - 1-2 sets - 5-10 reps - 10 sec  hold - Seated Scapular Retraction  - 2 x daily - 7 x weekly - 1-2 sets - 10 reps - 10 sec  hold - Shoulder External Rotation and Scapular Retraction  - 3 x daily - 7 x weekly - 1 sets - 10 reps - 3-5 sec  hold - Standing Shoulder W at Wall  - 1-2 x daily - 7 x weekly - 1 sets - 10 reps - 3 sec  hold - Doorway Pec Stretch at 60 Degrees Abduction  - 3 x daily - 7 x weekly - 1 sets - 3 reps - Doorway Pec Stretch at 90 Degrees Abduction  - 3 x daily - 7 x weekly - 1 sets - 3 reps - 30 seconds  hold - Doorway Pec Stretch at 120 Degrees Abduction  - 3 x daily - 7 x weekly - 1 sets - 3 reps - 30 second hold  hold - Standing Pectoral Release with Ball at Wall  - 3-4 x daily - 7 x weekly - Standing Infraspinatus/Teres Minor Release with Ball at Wall  - 2 x daily - 7 x weekly  Patient Education - Hospital doctor - Trigger Point Dry Needling  ASSESSMENT:  CLINICAL IMPRESSION: Patient is a 57 y.o. female who was seen today for physical therapy evaluation and treatment for cervicalgia and headache which I chronic in nature over the past 20+ years with flare up of symptoms in the past 6 months. Patient has a history of cervical cyst at C7 with VP shunt placement R; cervical ACDF C5/6; R first rib resection ~ 40 yrs ago; lumbar spine fusion ?level; L elbow tendon repair with ORIF;  chronic pain; retired on disability ~ 25 yrs. Patient presents clinically with poor posture and alignment; limited cervical and shoulder ROM/mobility; significant muscular tightness through cervical and upper body; poor movement patterns; pain on a daily basis. Patient will benefit from PT to address problems identified.   OBJECTIVE IMPAIRMENTS: decreased mobility, decreased ROM, decreased strength, hypomobility, increased fascial restrictions, increased muscle spasms, impaired flexibility, impaired UE functional use, improper body mechanics, postural dysfunction, and pain.   ACTIVITY LIMITATIONS: carrying, lifting, bending, sitting, standing, sleeping, and reach over head  PARTICIPATION LIMITATIONS: meal prep, cleaning, laundry, community activity, and occupation  PERSONAL FACTORS: Past/current experiences and Time since onset of injury/illness/exacerbation are also affecting patient's functional outcome.   REHAB POTENTIAL: Good  CLINICAL DECISION MAKING: Stable/uncomplicated  EVALUATION COMPLEXITY: Low   GOALS: Goals reviewed with patient? Yes  SHORT TERM GOALS: Target date: 02/03/2023   Independent in initial HEP  Baseline:  Goal status: INITIAL  2.  Patient will tolerate 20 minutes of stretching, strengthening, postural correction with minimal to no increase in pain  Baseline:  Goal status: INITIAL   LONG TERM GOALS: Target date: 03/17/2023   Improve posture and alignment with patient to demonstrate improve upright posture with posterior shoulder girdle engaged and improved spinal alignment  Baseline:  Goal status: INITIAL  2.  Decrease frequency, intensity and/or duration of pain by 25-50% allowing patient to be more active with ADL's and exercise  Baseline:  Goal status: INITIAL  3.  Increase cervical ROM by 3-5 degrees in all planes  Baseline:  Goal status: INITIAL  4.  Increase posterior shoulder girdle strength to 5/5  Baseline:  Goal status: INITIAL  5.   Independent in HEP including aquatic therapy as indicated  Baseline:  Goal status: INITIAL  6.  Improve functional limitation score to 50  Baseline:  Goal status: INITIAL   PLAN:  PT FREQUENCY: 2x/week  PT DURATION: 12 weeks  PLANNED INTERVENTIONS: Therapeutic exercises, Therapeutic activity, Neuromuscular re-education, Patient/Family education, Self Care, Joint mobilization, Aquatic Therapy, Dry Needling, Electrical stimulation, Spinal mobilization, Cryotherapy, Moist heat, Taping, Traction, Ultrasound, Ionotophoresis 4mg /ml Dexamethasone, Manual therapy, and Re-evaluation  PLAN FOR NEXT SESSION: review and progress exercises; continue with postural correction and education; manual work, DN, modalities as indicated    [employer], PT 12/22/2022, 5:20 PM

## 2022-12-23 ENCOUNTER — Ambulatory Visit: Payer: Medicare HMO | Attending: Neurosurgery | Admitting: Rehabilitative and Restorative Service Providers"

## 2022-12-23 ENCOUNTER — Encounter: Payer: Self-pay | Admitting: Rehabilitative and Restorative Service Providers"

## 2022-12-23 ENCOUNTER — Other Ambulatory Visit: Payer: Self-pay

## 2022-12-23 DIAGNOSIS — M542 Cervicalgia: Secondary | ICD-10-CM | POA: Diagnosis not present

## 2022-12-23 DIAGNOSIS — R42 Dizziness and giddiness: Secondary | ICD-10-CM | POA: Insufficient documentation

## 2022-12-23 DIAGNOSIS — M62838 Other muscle spasm: Secondary | ICD-10-CM | POA: Insufficient documentation

## 2022-12-23 DIAGNOSIS — R293 Abnormal posture: Secondary | ICD-10-CM | POA: Insufficient documentation

## 2022-12-23 DIAGNOSIS — G4486 Cervicogenic headache: Secondary | ICD-10-CM | POA: Insufficient documentation

## 2022-12-25 ENCOUNTER — Ambulatory Visit: Payer: Medicare HMO | Admitting: Physical Therapy

## 2022-12-29 ENCOUNTER — Ambulatory Visit: Payer: Medicare HMO | Admitting: Rehabilitative and Restorative Service Providers"

## 2022-12-29 ENCOUNTER — Encounter: Payer: Self-pay | Admitting: Rehabilitative and Restorative Service Providers"

## 2022-12-29 DIAGNOSIS — M62838 Other muscle spasm: Secondary | ICD-10-CM

## 2022-12-29 DIAGNOSIS — G4486 Cervicogenic headache: Secondary | ICD-10-CM

## 2022-12-29 DIAGNOSIS — M542 Cervicalgia: Secondary | ICD-10-CM | POA: Diagnosis not present

## 2022-12-29 DIAGNOSIS — R293 Abnormal posture: Secondary | ICD-10-CM | POA: Diagnosis not present

## 2022-12-29 DIAGNOSIS — R42 Dizziness and giddiness: Secondary | ICD-10-CM | POA: Diagnosis not present

## 2022-12-29 NOTE — Therapy (Signed)
OUTPATIENT PHYSICAL THERAPY CERVICAL EVALUATION   Patient Name: Katrina Fernandez MRN: [id number] DOB:06/04/1965, 57 y.o., female Today's Date: 12/29/2022  END OF SESSION:  PT End of Session - 12/29/22 0805     Visit Number 2    Number of Visits 24    Date for PT Re-Evaluation 03/17/23    Authorization Type [employer]    Progress Note Due on Visit 10    PT Start Time 0804    PT Stop Time 0853    PT Time Calculation (min) 49 min    Activity Tolerance Patient tolerated treatment well             Past Medical History:  Diagnosis Date   Anemia    Anxiety    Arthritis    "back and knees" (07/23/2016)   Chiari malformation type I (HCC) dx'd 2014   Chronic back pain    buldging, DDD; "neck and lower back" (07/23/2016)   Chronic constipation    takes Miralax daily   Chronic pain syndrome    takes Methadone and Keppra daily   Depression    takes Effexor daily   Endometriosis    1995   Fibromyalgia    GERD (gastroesophageal reflux disease)    not taking anything at the present time   Headache    History of DVT of lower extremity 07/2012   LEFT LEG --  3/ 2014.Was on a blood transfusion for 6 months   History of kidney stones    History of MRSA infection 10 yrs ago   Hyperlipidemia    takes Simvastatin daily   Migraine    "daily recently" (07/23/2016)   Muscle spasm    takes Zanaflex nightly   Peripheral edema    takes Lasix daily as needed   Peripheral neuropathy    Pleurisy 2000   Pneumonia    hx of-7-8 yrs ago   PONV (postoperative nausea and vomiting)    Seasonal allergies    takes Claritin daily   Spinal headache    "since 2002; still get them; try to manage it w/lying flat"   Past Surgical History:  Procedure Laterality Date   ANTERIOR CERVICAL DECOMP/DISCECTOMY FUSION  07/25/2009   C5 -- C6   APPENDECTOMY  age 77   APPLICATION OF CRANIAL NAVIGATION N/A 06/23/2017   Procedure: APPLICATION OF CRANIAL NAVIGATION;  Surgeon: Donalee Citrin, MD;  Location:  [facility] OR;  Service: Neurosurgery;  Laterality: N/A;   BACK SURGERY     BRAIN SURGERY     VP shunt   BREAST REDUCTION SURGERY Bilateral 06/07/2013   Procedure: BILATERAL MAMMARY REDUCTION  (BREAST);  Surgeon: Wayland Denis, DO;  Location: [facility];  Service: Plastics;  Laterality: Bilateral;   BREAST REDUCTION SURGERY Bilateral 06/07/2013   Procedure:  LIPOSUCTION;  Surgeon: Wayland Denis, DO;  Location:  SURGERY CENTER;  Service: Plastics;  Laterality: Bilateral;   CARPAL TUNNEL RELEASE Bilateral 2001   COLONOSCOPY  X 2   "polyps were benign"   CYSTOSCOPY WITH RETROGRADE PYELOGRAM, URETEROSCOPY AND STENT PLACEMENT Left 05/2021   (in my chart)   DILATION AND CURETTAGE OF UTERUS     ESOPHAGOGASTRODUODENOSCOPY     gastric banding undone  11/2014   LAPAROSCOPIC CHOLECYSTECTOMY  ~ 2000   LAPAROSCOPIC GASTRIC BANDING  2011   LAPAROSCOPIC REVISION VENTRICULAR-PERITONEAL (V-P) SHUNT N/A 06/23/2017   Procedure: Shunt Placment stereotactic ventricular catheter placement and general surgery abdominal exposure  (Laparoscopic vs. Open) Brain Lab;  Surgeon: Phylliss Blakes  A, MD;  Location: [facility] OR;  Service: General;  Laterality: N/A;   LAPAROSCOPY N/A 06/23/2017   Procedure: LAPAROSCOPY DIAGNOSTIC;  Surgeon: Berna Bue, MD;  Location: [facility] OR;  Service: General;  Laterality: N/A;   LUMBAR LAMINECTOMY/DECOMPRESSION MICRODISCECTOMY N/A 07/23/2016   Procedure: Decompressive Lumbar Laminectomy Lumbar two-three  for Repair of  Cerberal Spinal Fluid  Leak;  Surgeon: Donalee Citrin, MD;  Location: [facility] OR;  Service: Neurosurgery;  Laterality: N/A;   LUMBAR LAMINECTOMY/DECOMPRESSION MICRODISCECTOMY Left 03/01/2019   Procedure: LEFT LUMBAR THREE-FOUR EXTRAFORAMINAL MICRODISCECTOMY;  Surgeon: Donalee Citrin, MD;  Location: [facility] OR;  Service: Neurosurgery;  Laterality: Left;   PLACEMENT LUMBOPERITONEAL SHUNT FOR CEREBROSPINAL FLUID DIVERSION AWAY FROM C8 PERINEURAL CYST  09-05-2003  &  12-24-2003    08-31-2003  REMOVAL SHUNT   POSTERIOR CERVICAL LAMINECTOMY  03/01/2001   C7-8 and Exploration C8 perineural cyst   POSTERIOR LUMBAR LAMINECTOMY L4-5/  DISKECTOMY L5-S1  05/20/2005   RE-DO DECOMPRESSION LAMINECTOMY AND FUSION L4-5  &  L5-S1  12/20/2006   02-04-2007--  RE-EXPLORATION L4 to S1, I & D LUMBAR WOUND HEMATOMA   REDUCTION MAMMAPLASTY     SHUNT REMOVAL N/A 07/10/2016   Procedure: SHUNT REMOVAL;  Surgeon: Donalee Citrin, MD;  Location: [facility] OR;  Service: Neurosurgery;  Laterality: N/A;  SHUNT REMOVAL   SPINAL CORD STIMULATOR INSERTION N/A 04/26/2015   Procedure: LUMBAR SPINAL CORD STIMULATOR INSERTION;  Surgeon: Odette Fraction, MD;  Location: [facility];  Service: Neurosurgery;  Laterality: N/A;  LUMBAR SPINAL CORD STIMULATOR INSERTION   SPINAL CORD STIMULATOR REMOVAL N/A 06/28/2019   Procedure: Removal of dorsal column stimulator and generator -;  Surgeon: Donalee Citrin, MD;  Location: [facility] OR;  Service: Neurosurgery;  Laterality: N/A;   TENNIS ELBOW RELEASE/NIRSCHEL PROCEDURE Left 12/13/2014   Procedure: LEFT ELBOW LATERAL EPICONDYLAR DEBRIDEMENT AND OR REPAIR -RECONSTRUCTION ;  Surgeon: Bradly Bienenstock, MD;  Location: [facility];  Service: Orthopedics;  Laterality: Left;   TOTAL ABDOMINAL HYSTERECTOMY  1994   w/  Bilateral Salpinoophorectomy   VENTRICULOPERITONEAL SHUNT N/A 06/23/2017   Procedure: VENTRICULAR-PERITONEAL SHUNT INSERTION WITH ABDOMINAL LAPARASCOPY AND BRAINLAB NAVIGATION;  Surgeon: Donalee Citrin, MD;  Location: [facility] OR;  Service: Neurosurgery;  Laterality: N/A;   Patient Active Problem List   Diagnosis Date Noted   Chronic dyspnea 09/04/2022   Chest pain on breathing 04/23/2022   Dizziness 02/05/2022   AKI (acute kidney injury) (HCC) 06/22/2021   Occipital neuralgia of right side 05/01/2021   Sinobronchitis 03/07/2021   Eczema 02/19/2021   Flank pain with history of urolithiasis 11/29/2020   Shingles 09/02/2020   URI (upper respiratory infection) 08/21/2020    Elevated blood pressure reading 02/13/2020   Abnormal urinalysis 02/13/2020   Nephrolithiasis 02/08/2020   Hematuria 11/26/2019   Other fatigue 11/26/2019   Bronchitis 09/12/2019   COVID-19 07/26/2019   HNP (herniated nucleus pulposus), lumbar 03/01/2019   Cubital tunnel syndrome on left 03/17/2018   Plantar fasciitis of left foot 03/17/2018   CSF leak 06/23/2017   Trochanteric bursitis, left hip 06/09/2017   Pseudotumor cerebri 03/31/2017   Obesity 09/15/2016   Abnormal weight gain 09/03/2016   Chiari I malformation (HCC) 07/09/2016   Prediabetes 02/21/2016   RLS (restless legs syndrome) 10/23/2015   Seasonal allergies 10/23/2015   Hyperlipidemia 07/17/2015   Osteoarthritis of carpometacarpal joint of left thumb 06/28/2015   Fibromyalgia 05/30/2015   Chronic back pain 05/09/2015   Therapeutic opioid-induced constipation (OIC) 01/31/2015   Insomnia 07/09/2014   Generalized anxiety disorder 04/03/2014  Spider angioma 10/11/2013   Chronic venous insufficiency 09/25/2013   Symptomatic mammary hypertrophy 05/30/2013   Nevus, non-neoplastic 08/01/2012   Pain syndrome, chronic 06/20/2012   Postlaminectomy syndrome of lumbar region 06/20/2012   Primary osteoarthritis of both knees 02/08/2012   MDD (major depressive disorder) 01/28/2012   Subluxation of peroneal tendon of right foot 01/28/2012   GERD (gastroesophageal reflux disease) 01/28/2012   Complication of gastric band procedure 08/26/2011    PCP: Dr Everrett Coombe  REFERRING PROVIDER: Dr Donalee Citrin  REFERRING DIAG: Cervicalgia  THERAPY DIAG:  Abnormal posture  Other muscle spasm  Cervicogenic headache  Cervicalgia  Dizziness and giddiness  Rationale for Evaluation and Treatment: Rehabilitation  ONSET DATE: 06/18/22  SUBJECTIVE:                                                                                                                                                                                                          SUBJECTIVE STATEMENT: Patient reports that she was in a lot of pain following evaluation. She had a terrible headache for the next couple of days. Has not tried exercises again. Nothing has worked in therapy in the past and she does not think therapy will help. Every time she has had therapy in the past the pain increases and she ends up taking more medication. Discussed beginning gentle exercises and a walking program.   Evaluation: Patient reports that she had MRI of neck from the pain clinic and showed degenerative changes. She was seen for 3-4 visits in May with no change in symptoms. Some days are worse than others. She has a cyst at C7 which is inoperable. She has underwent cervical fusion ~ 1999 above the C7 level. She has had neck pain for > 25 years. She had a LP shunt to address fluid in the cyst. She had a VP shunt ~ 5 years which has helped the headaches. About 6 months ago she had increased neck pain and a feeling of swelling in the back of her neck and difficulty putting her head back. She has occasional pain in the L UE several days a week.    Hand dominance: Right  PERTINENT HISTORY:  Resection of R first rib ~ 40 yrs ago; Trigger fingers R long and ring fingers; LBP with fusion lumbar spine ~ 10 yrs ago; cervical fusion; VP shunt; hysterectomy; gall bladder; L elbow surgery for torn tendon; anxiety; depression  PAIN:  Are you having pain? Yes: NPRS scale: 4/10 Pain location: back of neck  Pain description: aching throbbing  Aggravating factors:  looking up; increased activities   Relieving factors: ice; lying down; meds at times   PRECAUTIONS: Other: VP shunt R anterior/lateral cervical area    WEIGHT BEARING RESTRICTIONS: No  FALLS:  Has patient fallen in last 6 months? No  LIVING ENVIRONMENT: Lives with: lives with their family Lives in: House/apartment  OCCUPATION: retired on disability ~ 20 years  Household chores; 28 yr old daughter; walking ~ 3 times  a week ~ 30 min  PATIENT GOALS: decrease neck pain and avoid surgery   NEXT MD VISIT: 9/24  OBJECTIVE:   DIAGNOSTIC FINDINGS:  CT cervical spine 10/17/21: 1. No acute osseous abnormality or evidence of significant degenerative spondylosis of the cervical spine. 2. Prior C5-6 ACDF with solid interbody fusion. 3. Unchanged congenital findings spinal dysraphism. Grossly stable right C7 perineural root sleeve cyst.  PATIENT SURVEYS:  FOTO 40; goal 50    SENSATION: WFL  POSTURE: Patient presents with head forward posture with increased thoracic kyphosis; shoulders rounded and elevated; scapulae abducted and rotated along the thoracic spine; head of the humerus anterior in orientation.   PALPATION: Significant muscular tightness R > L anterior/lateral/posterior cervical musculature; pecs; upper traps; leveator; teres; lats    CERVICAL ROM:   Active ROM A/PROM (deg) eval  Flexion 29 tight, pull  Extension 38 tight, pull  Right lateral flexion 27 tight   Left lateral flexion 25 tight   Right rotation 48  Left rotation 50   (Blank rows = not tested)  UPPER EXTREMITY ROM: end range tightness with elevation   Active ROM Right eval Left eval  Shoulder flexion [facility]  Shoulder extension    Shoulder abduction [facility]  Shoulder adduction    Shoulder extension    Shoulder internal rotation    Shoulder external rotation    Elbow flexion    Elbow extension    Wrist flexion    Wrist extension    Wrist ulnar deviation    Wrist radial deviation    Wrist pronation    Wrist supination     (Blank rows = not tested)  UPPER EXTREMITY MMT:  MMT Right eval Left eval  Shoulder flexion    Shoulder extension    Shoulder abduction    Shoulder adduction    Shoulder extension    Shoulder internal rotation    Shoulder external rotation    Middle trapezius 4- 4  Lower trapezius 4- 4  Elbow flexion    Elbow extension    Wrist flexion    Wrist extension    Wrist ulnar  deviation    Wrist radial deviation    Wrist pronation    Wrist supination    Grip strength     (Blank rows = not tested)  CERVICAL SPECIAL TESTS:  Spurling's test: Negative, and Distraction test: Negative   [facility] Adult PT Treatment:                                                DATE: 12/29/22 Therapeutic Exercise: Standing Chin tuck with noodle 5 sec x 5 Scap squeeze with noodle 5 sec x 5  L's with noodle 3 sec x 10 W's with noodle 3 sec x 10  Nodding yes/no 10-15 Corner stretch 3 positions 20-30 sec hold x 2 each position(hold for now)   Manual Therapy: Skilled palpation to assess response to manual work  and dry needling  Trigger Point Dry-Needling  Treatment instructions: Expect mild to moderate muscle soreness. S/S of pneumothorax if dry needled over a lung field, and to seek immediate medical attention should they occur. Patient verbalized understanding of these instructions and education.  Patient Consent Given: Yes Education handout provided: Previously provided Muscles treated: cervical paraspinals R/L 3 needles each side  Electrical stimulation performed: No Parameters: N/A Treatment response/outcome: decreased palpable tightness   Neuromuscular re-ed: Postural education and correction  Therapeutic Activity: Myofacial ball release work standing 4 inch ball on wall  Modalities: Moist heat cervical spine x 10 min  Self Care: Use of noodle for sitting  Ball release work  [facility] Adult PT Treatment:                                                DATE: 12/23/22 Therapeutic Exercise: Standing Chin tuck with noodle 5 sec x 5 Scap squeeze with noodle 5 sec x 5  L's with noodle 3 sec x 10 W's with noodle 3 sec x 10  Corner stretch 3 positions 20-30 sec hold x 2 each position  Manual Therapy: Add trial of DN Neuromuscular re-ed: Postural education and correction  Therapeutic Activity: Myofacial ball release work standing 4 inch ball on wall  Modalities:  Self  Care: Use of noodle for sitting  Ball release work   PATIENT EDUCATION:  Education details: HEP; POC Person educated: Patient Education method: Programmer, multimedia, Facilities manager, Actor cues, Verbal cues, and Handouts Education comprehension: verbalized understanding, returned demonstration, verbal cues required, tactile cues required, and needs further education  HOME EXERCISE PROGRAM: Access Code: [email or web address] URL: [email or web address] Date: 12/23/2022 Prepared by: Corlis Leak  Exercises - Seated Cervical Retraction  - 2 x daily - 7 x weekly - 1-2 sets - 5-10 reps - 10 sec  hold - Seated Scapular Retraction  - 2 x daily - 7 x weekly - 1-2 sets - 10 reps - 10 sec  hold - Shoulder External Rotation and Scapular Retraction  - 3 x daily - 7 x weekly - 1 sets - 10 reps - 3-5 sec   hold - Standing Shoulder W at Wall  - 1-2 x daily - 7 x weekly - 1 sets - 10 reps - 3 sec  hold - Doorway Pec Stretch at 60 Degrees Abduction  - 3 x daily - 7 x weekly - 1 sets - 3 reps - Doorway Pec Stretch at 90 Degrees Abduction  - 3 x daily - 7 x weekly - 1 sets - 3 reps - 30 seconds  hold - Doorway Pec Stretch at 120 Degrees Abduction  - 3 x daily - 7 x weekly - 1 sets - 3 reps - 30 second hold  hold - Standing Pectoral Release with Ball at Wall  - 3-4 x daily - 7 x weekly - Standing Infraspinatus/Teres Minor Release with Ball at Wall  - 2 x daily - 7 x weekly  Patient Education - Hospital doctor - Trigger Point Dry Needling  ASSESSMENT:  CLINICAL IMPRESSION: Patient reports increased pain following initial evaluation and exercises. She has not done any of the exercises since eval. Reviewed gentle exercises and encouraged patient to start with exercises and a walking program starting with 5 min and gradually increasing - level surfaces air conditioned space. Trial of DN for posterior cervical spine  bilat 3 needles each side. Tolerated well. Discussed expectations for therapy in light of  chronic pain.    Evaluation: Patient is a 57 y.o. female who was seen today for physical therapy evaluation and treatment for cervicalgia and headache which I chronic in nature over the past 20+ years with flare up of symptoms in the past 6 months. Patient has a history of cervical cyst at C7 with VP shunt placement R; cervical ACDF C5/6; R first rib resection ~ 40 yrs ago; lumbar spine fusion ?level; L elbow tendon repair with ORIF; chronic pain; retired on disability ~ 25 yrs. Patient presents clinically with poor posture and alignment; limited cervical and shoulder ROM/mobility; significant muscular tightness through cervical and upper body; poor movement patterns; pain on a daily basis. Patient will benefit from PT to address problems identified.   OBJECTIVE IMPAIRMENTS: decreased mobility, decreased ROM, decreased strength, hypomobility, increased fascial restrictions, increased muscle spasms, impaired flexibility, impaired UE functional use, improper body mechanics, postural dysfunction, and pain.    GOALS: Goals reviewed with patient? Yes  SHORT TERM GOALS: Target date: 02/03/2023   Independent in initial HEP  Baseline:  Goal status: INITIAL  2.  Patient will tolerate 20 minutes of stretching, strengthening, postural correction with minimal to no increase in pain  Baseline:  Goal status: INITIAL   LONG TERM GOALS: Target date: 03/17/2023   Improve posture and alignment with patient to demonstrate improve upright posture with posterior shoulder girdle engaged and improved spinal alignment  Baseline:  Goal status: INITIAL  2.  Decrease frequency, intensity and/or duration of pain by 25-50% allowing patient to be more active with ADL's and exercise  Baseline:  Goal status: INITIAL  3.  Increase cervical ROM by 3-5 degrees in all planes  Baseline:  Goal status: INITIAL  4.  Increase posterior shoulder girdle strength to 5/5  Baseline:  Goal status: INITIAL  5.  Independent  in HEP including aquatic therapy as indicated  Baseline:  Goal status: INITIAL  6.  Improve functional limitation score to 50  Baseline:  Goal status: INITIAL   PLAN:  PT FREQUENCY: 2x/week  PT DURATION: 12 weeks  PLANNED INTERVENTIONS: Therapeutic exercises, Therapeutic activity, Neuromuscular re-education, Patient/Family education, Self Care, Joint mobilization, Aquatic Therapy, Dry Needling, Electrical stimulation, Spinal mobilization, Cryotherapy, Moist heat, Taping, Traction, Ultrasound, Ionotophoresis 4mg /ml Dexamethasone, Manual therapy, and Re-evaluation  PLAN FOR NEXT SESSION: review and progress exercises; continue with postural correction and education; manual work, DN, modalities as indicated    [employer], PT 12/29/2022, 8:06 AM

## 2022-12-30 ENCOUNTER — Encounter: Payer: Self-pay | Admitting: Family Medicine

## 2022-12-31 ENCOUNTER — Ambulatory Visit: Payer: Medicare HMO | Admitting: Rehabilitative and Restorative Service Providers"

## 2023-01-04 ENCOUNTER — Ambulatory Visit: Payer: Medicare HMO | Admitting: Rehabilitative and Restorative Service Providers"

## 2023-01-05 ENCOUNTER — Other Ambulatory Visit: Payer: Self-pay | Admitting: Family Medicine

## 2023-01-05 ENCOUNTER — Ambulatory Visit: Payer: Medicare HMO | Admitting: Family Medicine

## 2023-01-05 ENCOUNTER — Encounter: Payer: Self-pay | Admitting: Family Medicine

## 2023-01-05 VITALS — BP 113/89 | HR 86 | Ht 63.0 in | Wt 150.2 lb

## 2023-01-05 DIAGNOSIS — F419 Anxiety disorder, unspecified: Secondary | ICD-10-CM | POA: Insufficient documentation

## 2023-01-05 MED ORDER — ESCITALOPRAM OXALATE 5 MG PO TABS: 5.0000 mg | ORAL_TABLET | Freq: Every day | ORAL | 0 refills | Status: DC

## 2023-01-05 NOTE — Patient Instructions (Signed)
Continue to taper from duloxetine-Stay at 30mg  daily for the next week then every other day for 2 weeks.  Start lexapro 5mg  daily.

## 2023-01-05 NOTE — Progress Notes (Signed)
Katrina Fernandez - 57 y.o. female MRN [id number]  Date of birth: [birth year]  Subjective Chief Complaint  Patient presents with   Anxiety    Cymbalta has been reduced to once daily 30mg  but not stopped yet.  She is still having anxiety but is now also irritable .     HPI Katrina Fernandez is a 58 y.o. female here today for follow up visit.   We have been working to wean cymbalta due to feeling that it was not effective and was causing more sweats.  She has had increased anxiety with weaning from cymbalta.  She reports increased irritability since cutting back on this as well.   Sweating seems improved at this time.  She has never tried any other SSRI type medications that she recalls.   ROS:  A comprehensive ROS was completed and negative except as noted per HPI  Allergies  Allergen Reactions   Penicillins Anaphylaxis    11/28/20 - patient tolerated Keflex w/o adverse effects    Tape Rash    Plastic tape, silicone tape, Steri Strips   Bactrim [Sulfamethoxazole-Trimethoprim] Nausea Only   Paxlovid [Nirmatrelvir-Ritonavir] Nausea And Vomiting   Chlorhexidine Rash and Other (See Comments)    Dermatitis - Allergic    Past Medical History:  Diagnosis Date   Anemia    Anxiety    Arthritis    "back and knees" (07/23/2016)   Chiari malformation type I (HCC) dx'd 2014   Chronic back pain    buldging, DDD; "neck and lower back" (07/23/2016)   Chronic constipation    takes Miralax daily   Chronic pain syndrome    takes Methadone and Keppra daily   Depression    takes Effexor daily   Endometriosis    1995   Fibromyalgia    GERD (gastroesophageal reflux disease)    not taking anything at the present time   Headache    History of DVT of lower extremity 07/2012   LEFT LEG --  3/ 2014.Was on a blood transfusion for 6 months   History of kidney stones    History of MRSA infection 10 yrs ago   Hyperlipidemia    takes Simvastatin daily   Migraine    "daily recently" (07/23/2016)    Muscle spasm    takes Zanaflex nightly   Peripheral edema    takes Lasix daily as needed   Peripheral neuropathy    Pleurisy 2000   Pneumonia    hx of-7-8 yrs ago   PONV (postoperative nausea and vomiting)    Seasonal allergies    takes Claritin daily   Spinal headache    "since 2002; still get them; try to manage it w/lying flat"    Past Surgical History:  Procedure Laterality Date   ANTERIOR CERVICAL DECOMP/DISCECTOMY FUSION  07/25/2009   C5 -- C6   APPENDECTOMY  age 42   APPLICATION OF CRANIAL NAVIGATION N/A 06/23/2017   Procedure: APPLICATION OF CRANIAL NAVIGATION;  Surgeon: Donalee Citrin, MD;  Location: [facility] OR;  Service: Neurosurgery;  Laterality: N/A;   BACK SURGERY     BRAIN SURGERY     VP shunt   BREAST REDUCTION SURGERY Bilateral 06/07/2013   Procedure: BILATERAL MAMMARY REDUCTION  (BREAST);  Surgeon: Wayland Denis, DO;  Location: [facility];  Service: Plastics;  Laterality: Bilateral;   BREAST REDUCTION SURGERY Bilateral 06/07/2013   Procedure:  LIPOSUCTION;  Surgeon: Wayland Denis, DO;  Location: [facility];  Service: Plastics;  Laterality: Bilateral;  CARPAL TUNNEL RELEASE Bilateral 2001   COLONOSCOPY  X 2   "polyps were benign"   CYSTOSCOPY WITH RETROGRADE PYELOGRAM, URETEROSCOPY AND STENT PLACEMENT Left 05/2021   (in my chart)   DILATION AND CURETTAGE OF UTERUS     ESOPHAGOGASTRODUODENOSCOPY     gastric banding undone  11/2014   LAPAROSCOPIC CHOLECYSTECTOMY  ~ 2000   LAPAROSCOPIC GASTRIC BANDING  2011   LAPAROSCOPIC REVISION VENTRICULAR-PERITONEAL (V-P) SHUNT N/A 06/23/2017   Procedure: Shunt Placment stereotactic ventricular catheter placement and general surgery abdominal exposure  (Laparoscopic vs. Open) Brain Lab;  Surgeon: Berna Bue, MD;  Location: [facility] OR;  Service: General;  Laterality: N/A;   LAPAROSCOPY N/A 06/23/2017   Procedure: LAPAROSCOPY DIAGNOSTIC;  Surgeon: Berna Bue, MD;  Location: [facility] OR;  Service:  General;  Laterality: N/A;   LUMBAR LAMINECTOMY/DECOMPRESSION MICRODISCECTOMY N/A 07/23/2016   Procedure: Decompressive Lumbar Laminectomy Lumbar two-three  for Repair of  Cerberal Spinal Fluid  Leak;  Surgeon: Donalee Citrin, MD;  Location: [facility] OR;  Service: Neurosurgery;  Laterality: N/A;   LUMBAR LAMINECTOMY/DECOMPRESSION MICRODISCECTOMY Left 03/01/2019   Procedure: LEFT LUMBAR THREE-FOUR EXTRAFORAMINAL MICRODISCECTOMY;  Surgeon: Donalee Citrin, MD;  Location: [facility] OR;  Service: Neurosurgery;  Laterality: Left;   PLACEMENT LUMBOPERITONEAL SHUNT FOR CEREBROSPINAL FLUID DIVERSION AWAY FROM C8 PERINEURAL CYST  09-05-2003  &  12-24-2003   08-31-2003  REMOVAL SHUNT   POSTERIOR CERVICAL LAMINECTOMY  03/01/2001   C7-8 and Exploration C8 perineural cyst   POSTERIOR LUMBAR LAMINECTOMY L4-5/  DISKECTOMY L5-S1  05/20/2005   RE-DO DECOMPRESSION LAMINECTOMY AND FUSION L4-5  &  L5-S1  12/20/2006   02-04-2007--  RE-EXPLORATION L4 to S1, I & D LUMBAR WOUND HEMATOMA   REDUCTION MAMMAPLASTY     SHUNT REMOVAL N/A 07/10/2016   Procedure: SHUNT REMOVAL;  Surgeon: Donalee Citrin, MD;  Location: [facility] OR;  Service: Neurosurgery;  Laterality: N/A;  SHUNT REMOVAL   SPINAL CORD STIMULATOR INSERTION N/A 04/26/2015   Procedure: LUMBAR SPINAL CORD STIMULATOR INSERTION;  Surgeon: Odette Fraction, MD;  Location: [facility];  Service: Neurosurgery;  Laterality: N/A;  LUMBAR SPINAL CORD STIMULATOR INSERTION   SPINAL CORD STIMULATOR REMOVAL N/A 06/28/2019   Procedure: Removal of dorsal column stimulator and generator -;  Surgeon: Donalee Citrin, MD;  Location: [facility] OR;  Service: Neurosurgery;  Laterality: N/A;   TENNIS ELBOW RELEASE/NIRSCHEL PROCEDURE Left 12/13/2014   Procedure: LEFT ELBOW LATERAL EPICONDYLAR DEBRIDEMENT AND OR REPAIR -RECONSTRUCTION ;  Surgeon: Bradly Bienenstock, MD;  Location: [facility];  Service: Orthopedics;  Laterality: Left;   TOTAL ABDOMINAL HYSTERECTOMY  1994   w/  Bilateral Salpinoophorectomy    VENTRICULOPERITONEAL SHUNT N/A 06/23/2017   Procedure: VENTRICULAR-PERITONEAL SHUNT INSERTION WITH ABDOMINAL LAPARASCOPY AND BRAINLAB NAVIGATION;  Surgeon: Donalee Citrin, MD;  Location: [facility] OR;  Service: Neurosurgery;  Laterality: N/A;    Social History   Socioeconomic History   Marital status: Divorced    Spouse name: Not on file   Number of children: 2   Years of education: 14   Highest education level: Associate degree: academic program  Occupational History   Occupation: Disabled   Tobacco Use   Smoking status: Never   Smokeless tobacco: Never  Vaping Use   Vaping status: Never Used  Substance and Sexual Activity   Alcohol use: Yes    Comment: once a year   Drug use: No   Sexual activity: Not Currently    Birth control/protection: Surgical  Other Topics Concern   Not on file  Social  History Narrative   Mrs Dewitt is divorced, on permanent disability and lives at home with her mother and her daughter. She is independent in her care needs. She denies concern with transportation to medical appointments She is on a fixed income. She enjoys crafting and quilting.   Social Determinants of Health   Financial Resource Strain: Low Risk  (09/04/2022)   Overall Financial Resource Strain (CARDIA)    Difficulty of Paying Living Expenses: Not very hard  Food Insecurity: No Food Insecurity (09/04/2022)   Hunger Vital Sign    Worried About Running Out of Food in the Last Year: Never true    Ran Out of Food in the Last Year: Never true  Transportation Needs: No Transportation Needs (09/04/2022)   PRAPARE - Administrator, Civil Service (Medical): No    Lack of Transportation (Non-Medical): No  Physical Activity: Inactive (09/04/2022)   Exercise Vital Sign    Days of Exercise per Week: 0 days    Minutes of Exercise per Session: 0 min  Stress: No Stress Concern Present (09/04/2022)   [employer] of Occupational Health - Occupational Stress Questionnaire    Feeling of  Stress : Only a little  Recent Concern: Stress - Stress Concern Present (08/03/2022)   [employer] of Occupational Health - Occupational Stress Questionnaire    Feeling of Stress : To some extent  Social Connections: Unknown (09/04/2022)   Social Connection and Isolation Panel [NHANES]    Frequency of Communication with Friends and Family: More than three times a week    Frequency of Social Gatherings with Friends and Family: Twice a week    Attends Religious Services: Patient declined    Database administrator or Organizations: Yes    Attends Banker Meetings: 1 to 4 times per year    Marital Status: Divorced  Recent Concern: Social Connections - Moderately Isolated (08/03/2022)   Social Connection and Isolation Panel [NHANES]    Frequency of Communication with Friends and Family: Three times a week    Frequency of Social Gatherings with Friends and Family: More than three times a week    Attends Religious Services: Never    Database administrator or Organizations: Yes    Attends Engineer, structural: More than 4 times per year    Marital Status: Divorced    Family History  Problem Relation Age of Onset   COPD Mother    Diabetes Mother    Asthma Mother    Diabetes Father    Colon cancer Father    Ovarian cancer Maternal Grandmother    Pancreatic cancer Maternal Grandfather    Depression Other     Health Maintenance  Topic Date Due   DTaP/Tdap/Td (2 - Td or Tdap) 05/19/2019   INFLUENZA VACCINE  12/17/2022   Zoster Vaccines- Shingrix (2 of 2) 02/05/2023 (Originally 04/02/2022)   COVID-19 Vaccine (3 - Moderna risk series) 02/20/2023 (Originally 12/05/2019)   Hepatitis C Screening  08/03/2023 (Originally 08/23/1985)   Colonoscopy  07/05/2023   Medicare Annual Wellness (AWV)  08/03/2023   MAMMOGRAM  08/29/2023   HIV Screening  Completed   HPV VACCINES  Aged Out      ----------------------------------------------------------------------------------------------------------------------------------------------------------------------------------------------------------------- Physical Exam BP 113/89   Pulse 86   Ht 5\' 3"  (1.6 m)   Wt 150 lb 4 oz (68.2 kg)   SpO2 99%   BMI 26.62 kg/m   Physical Exam Constitutional:      Appearance: Normal appearance.  HENT:  Head: Normocephalic and atraumatic.  Neurological:     General: No focal deficit present.     Mental Status: She is alert.  Psychiatric:        Mood and Affect: Mood normal.        Behavior: Behavior normal.     ------------------------------------------------------------------------------------------------------------------------------------------------------------------------------------------------------------------- Assessment and Plan  Anxiety Increased anxiety and irritability with weaning from cymbalta.  Will add low dose lexapro 5mg  daily as she continues to wean from cymbalta.  F/u in 6-8 weeks.    Meds ordered this encounter  Medications   escitalopram (LEXAPRO) 5 MG tablet    Sig: Take 1 tablet (5 mg total) by mouth daily.    Dispense:  90 tablet    Refill:  0    Return in about 8 weeks (around 03/02/2023) for F/u  Anxiety.    This visit occurred during the SARS-CoV-2 public health emergency.  Safety protocols were in place, including screening questions prior to the visit, additional usage of staff PPE, and extensive cleaning of exam room while observing appropriate contact time as indicated for disinfecting solutions.

## 2023-01-05 NOTE — Assessment & Plan Note (Signed)
Increased anxiety and irritability with weaning from cymbalta.  Will add low dose lexapro 5mg  daily as she continues to wean from cymbalta.  F/u in 6-8 weeks.

## 2023-01-07 ENCOUNTER — Encounter: Payer: Self-pay | Admitting: Rehabilitative and Restorative Service Providers"

## 2023-01-07 ENCOUNTER — Ambulatory Visit: Payer: Medicare HMO | Admitting: Rehabilitative and Restorative Service Providers"

## 2023-01-07 DIAGNOSIS — M62838 Other muscle spasm: Secondary | ICD-10-CM

## 2023-01-07 DIAGNOSIS — R42 Dizziness and giddiness: Secondary | ICD-10-CM | POA: Diagnosis not present

## 2023-01-07 DIAGNOSIS — G4486 Cervicogenic headache: Secondary | ICD-10-CM | POA: Diagnosis not present

## 2023-01-07 DIAGNOSIS — M542 Cervicalgia: Secondary | ICD-10-CM | POA: Diagnosis not present

## 2023-01-07 DIAGNOSIS — R293 Abnormal posture: Secondary | ICD-10-CM | POA: Diagnosis not present

## 2023-01-07 NOTE — Therapy (Signed)
OUTPATIENT PHYSICAL THERAPY CERVICAL EVALUATION   Patient Name: Katrina Fernandez MRN: [id number] DOB:1965-06-26, 57 y.o., female Today's Date: 01/07/2023  END OF SESSION:  PT End of Session - 01/07/23 0808     Visit Number 3    Number of Visits 24    Date for PT Re-Evaluation 03/17/23    Authorization Type [employer]    Progress Note Due on Visit 10    PT Start Time 0807    PT Stop Time 0852    PT Time Calculation (min) 45 min    Activity Tolerance Patient tolerated treatment well             Past Medical History:  Diagnosis Date   Anemia    Anxiety    Arthritis    "back and knees" (07/23/2016)   Chiari malformation type I (HCC) dx'd 2014   Chronic back pain    buldging, DDD; "neck and lower back" (07/23/2016)   Chronic constipation    takes Miralax daily   Chronic pain syndrome    takes Methadone and Keppra daily   Depression    takes Effexor daily   Endometriosis    1995   Fibromyalgia    GERD (gastroesophageal reflux disease)    not taking anything at the present time   Headache    History of DVT of lower extremity 07/2012   LEFT LEG --  3/ 2014.Was on a blood transfusion for 6 months   History of kidney stones    History of MRSA infection 10 yrs ago   Hyperlipidemia    takes Simvastatin daily   Migraine    "daily recently" (07/23/2016)   Muscle spasm    takes Zanaflex nightly   Peripheral edema    takes Lasix daily as needed   Peripheral neuropathy    Pleurisy 2000   Pneumonia    hx of-7-8 yrs ago   PONV (postoperative nausea and vomiting)    Seasonal allergies    takes Claritin daily   Spinal headache    "since 2002; still get them; try to manage it w/lying flat"   Past Surgical History:  Procedure Laterality Date   ANTERIOR CERVICAL DECOMP/DISCECTOMY FUSION  07/25/2009   C5 -- C6   APPENDECTOMY  age 28   APPLICATION OF CRANIAL NAVIGATION N/A 06/23/2017   Procedure: APPLICATION OF CRANIAL NAVIGATION;  Surgeon: Donalee Citrin, MD;  Location:  [facility] OR;  Service: Neurosurgery;  Laterality: N/A;   BACK SURGERY     BRAIN SURGERY     VP shunt   BREAST REDUCTION SURGERY Bilateral 06/07/2013   Procedure: BILATERAL MAMMARY REDUCTION  (BREAST);  Surgeon: Wayland Denis, DO;  Location: [facility];  Service: Plastics;  Laterality: Bilateral;   BREAST REDUCTION SURGERY Bilateral 06/07/2013   Procedure:  LIPOSUCTION;  Surgeon: Wayland Denis, DO;  Location: [facility];  Service: Plastics;  Laterality: Bilateral;   CARPAL TUNNEL RELEASE Bilateral 2001   COLONOSCOPY  X 2   "polyps were benign"   CYSTOSCOPY WITH RETROGRADE PYELOGRAM, URETEROSCOPY AND STENT PLACEMENT Left 05/2021   (in my chart)   DILATION AND CURETTAGE OF UTERUS     ESOPHAGOGASTRODUODENOSCOPY     gastric banding undone  11/2014   LAPAROSCOPIC CHOLECYSTECTOMY  ~ 2000   LAPAROSCOPIC GASTRIC BANDING  2011   LAPAROSCOPIC REVISION VENTRICULAR-PERITONEAL (V-P) SHUNT N/A 06/23/2017   Procedure: Shunt Placment stereotactic ventricular catheter placement and general surgery abdominal exposure  (Laparoscopic vs. Open) Brain Lab;  Surgeon: Phylliss Blakes  A, MD;  Location: [facility] OR;  Service: General;  Laterality: N/A;   LAPAROSCOPY N/A 06/23/2017   Procedure: LAPAROSCOPY DIAGNOSTIC;  Surgeon: Berna Bue, MD;  Location: [facility] OR;  Service: General;  Laterality: N/A;   LUMBAR LAMINECTOMY/DECOMPRESSION MICRODISCECTOMY N/A 07/23/2016   Procedure: Decompressive Lumbar Laminectomy Lumbar two-three  for Repair of  Cerberal Spinal Fluid  Leak;  Surgeon: Donalee Citrin, MD;  Location: [facility] OR;  Service: Neurosurgery;  Laterality: N/A;   LUMBAR LAMINECTOMY/DECOMPRESSION MICRODISCECTOMY Left 03/01/2019   Procedure: LEFT LUMBAR THREE-FOUR EXTRAFORAMINAL MICRODISCECTOMY;  Surgeon: Donalee Citrin, MD;  Location: [facility] OR;  Service: Neurosurgery;  Laterality: Left;   PLACEMENT LUMBOPERITONEAL SHUNT FOR CEREBROSPINAL FLUID DIVERSION AWAY FROM C8 PERINEURAL CYST  09-05-2003  &  12-24-2003    08-31-2003  REMOVAL SHUNT   POSTERIOR CERVICAL LAMINECTOMY  03/01/2001   C7-8 and Exploration C8 perineural cyst   POSTERIOR LUMBAR LAMINECTOMY L4-5/  DISKECTOMY L5-S1  05/20/2005   RE-DO DECOMPRESSION LAMINECTOMY AND FUSION L4-5  &  L5-S1  12/20/2006   02-04-2007--  RE-EXPLORATION L4 to S1, I & D LUMBAR WOUND HEMATOMA   REDUCTION MAMMAPLASTY     SHUNT REMOVAL N/A 07/10/2016   Procedure: SHUNT REMOVAL;  Surgeon: Donalee Citrin, MD;  Location: [facility] OR;  Service: Neurosurgery;  Laterality: N/A;  SHUNT REMOVAL   SPINAL CORD STIMULATOR INSERTION N/A 04/26/2015   Procedure: LUMBAR SPINAL CORD STIMULATOR INSERTION;  Surgeon: Odette Fraction, MD;  Location: [facility];  Service: Neurosurgery;  Laterality: N/A;  LUMBAR SPINAL CORD STIMULATOR INSERTION   SPINAL CORD STIMULATOR REMOVAL N/A 06/28/2019   Procedure: Removal of dorsal column stimulator and generator -;  Surgeon: Donalee Citrin, MD;  Location: [facility] OR;  Service: Neurosurgery;  Laterality: N/A;   TENNIS ELBOW RELEASE/NIRSCHEL PROCEDURE Left 12/13/2014   Procedure: LEFT ELBOW LATERAL EPICONDYLAR DEBRIDEMENT AND OR REPAIR -RECONSTRUCTION ;  Surgeon: Bradly Bienenstock, MD;  Location: [facility];  Service: Orthopedics;  Laterality: Left;   TOTAL ABDOMINAL HYSTERECTOMY  1994   w/  Bilateral Salpinoophorectomy   VENTRICULOPERITONEAL SHUNT N/A 06/23/2017   Procedure: VENTRICULAR-PERITONEAL SHUNT INSERTION WITH ABDOMINAL LAPARASCOPY AND BRAINLAB NAVIGATION;  Surgeon: Donalee Citrin, MD;  Location: [facility] OR;  Service: Neurosurgery;  Laterality: N/A;   Patient Active Problem List   Diagnosis Date Noted   Anxiety 01/05/2023   Chronic dyspnea 09/04/2022   Chest pain on breathing 04/23/2022   Dizziness 02/05/2022   AKI (acute kidney injury) (HCC) 06/22/2021   Occipital neuralgia of right side 05/01/2021   Sinobronchitis 03/07/2021   Eczema 02/19/2021   Flank pain with history of urolithiasis 11/29/2020   Shingles 09/02/2020   URI (upper respiratory  infection) 08/21/2020   Elevated blood pressure reading 02/13/2020   Abnormal urinalysis 02/13/2020   Nephrolithiasis 02/08/2020   Hematuria 11/26/2019   Other fatigue 11/26/2019   Bronchitis 09/12/2019   COVID-19 07/26/2019   HNP (herniated nucleus pulposus), lumbar 03/01/2019   Cubital tunnel syndrome on left 03/17/2018   Plantar fasciitis of left foot 03/17/2018   CSF leak 06/23/2017   Trochanteric bursitis, left hip 06/09/2017   Pseudotumor cerebri 03/31/2017   Obesity 09/15/2016   Abnormal weight gain 09/03/2016   Chiari I malformation (HCC) 07/09/2016   Prediabetes 02/21/2016   RLS (restless legs syndrome) 10/23/2015   Seasonal allergies 10/23/2015   Hyperlipidemia 07/17/2015   Osteoarthritis of carpometacarpal joint of left thumb 06/28/2015   Fibromyalgia 05/30/2015   Chronic back pain 05/09/2015   Therapeutic opioid-induced constipation (OIC) 01/31/2015   Insomnia 07/09/2014  Generalized anxiety disorder 04/03/2014   Spider angioma 10/11/2013   Chronic venous insufficiency 09/25/2013   Symptomatic mammary hypertrophy 05/30/2013   Nevus, non-neoplastic 08/01/2012   Pain syndrome, chronic 06/20/2012   Postlaminectomy syndrome of lumbar region 06/20/2012   Primary osteoarthritis of both knees 02/08/2012   MDD (major depressive disorder) 01/28/2012   Subluxation of peroneal tendon of right foot 01/28/2012   GERD (gastroesophageal reflux disease) 01/28/2012   Complication of gastric band procedure 08/26/2011    PCP: Dr Everrett Coombe  REFERRING PROVIDER: Dr Donalee Citrin  REFERRING DIAG: Cervicalgia  THERAPY DIAG:  Abnormal posture  Other muscle spasm  Cervicogenic headache  Cervicalgia  Rationale for Evaluation and Treatment: Rehabilitation  ONSET DATE: 06/18/22  SUBJECTIVE:                                                                                                                                                                                                          SUBJECTIVE STATEMENT: Patient reports that she has had COVID and has been "really sick". She had good relief in the L neck area following DN but increased pain and tightness resulting migraine HA. Would like to try the needling but less on the R side. Has not been doing exercises. Discussed beginning gentle exercises and a walking program.   Evaluation: Patient reports that she had MRI of neck from the pain clinic and showed degenerative changes. She was seen for 3-4 visits in May with no change in symptoms. Some days are worse than others. She has a cyst at C7 which is inoperable. She has underwent cervical fusion ~ 1999 above the C7 level. She has had neck pain for > 25 years. She had a LP shunt to address fluid in the cyst. She had a VP shunt ~ 5 years which has helped the headaches. About 6 months ago she had increased neck pain and a feeling of swelling in the back of her neck and difficulty putting her head back. She has occasional pain in the L UE several days a week.    Hand dominance: Right  PERTINENT HISTORY:  Resection of R first rib ~ 40 yrs ago; Trigger fingers R long and ring fingers; LBP with fusion lumbar spine ~ 10 yrs ago; cervical fusion; VP shunt; hysterectomy; gall bladder; L elbow surgery for torn tendon; anxiety; depression  PAIN:  Are you having pain? Yes: NPRS scale: 4/10 Pain location: back of neck  Pain description: aching throbbing  Aggravating factors: looking up; increased activities   Relieving factors: ice; lying down; meds at  times   PRECAUTIONS: Other: VP shunt R anterior/lateral cervical area    WEIGHT BEARING RESTRICTIONS: No  FALLS:  Has patient fallen in last 6 months? No  LIVING ENVIRONMENT: Lives with: lives with their family Lives in: House/apartment  OCCUPATION: retired on disability ~ 20 years  Household chores; 109 yr old daughter; walking ~ 3 times a week ~ 30 min  PATIENT GOALS: decrease neck pain and avoid surgery   NEXT MD  VISIT: 9/24  OBJECTIVE:   DIAGNOSTIC FINDINGS:  CT cervical spine 10/17/21: 1. No acute osseous abnormality or evidence of significant degenerative spondylosis of the cervical spine. 2. Prior C5-6 ACDF with solid interbody fusion. 3. Unchanged congenital findings spinal dysraphism. Grossly stable right C7 perineural root sleeve cyst.  PATIENT SURVEYS:  FOTO 40; goal 50    SENSATION: WFL  POSTURE: Patient presents with head forward posture with increased thoracic kyphosis; shoulders rounded and elevated; scapulae abducted and rotated along the thoracic spine; head of the humerus anterior in orientation.   PALPATION: Significant muscular tightness R > L anterior/lateral/posterior cervical musculature; pecs; upper traps; leveator; teres; lats    CERVICAL ROM:   Active ROM A/PROM (deg) eval  Flexion 29 tight, pull  Extension 38 tight, pull  Right lateral flexion 27 tight   Left lateral flexion 25 tight   Right rotation 48  Left rotation 50   (Blank rows = not tested)  UPPER EXTREMITY ROM: end range tightness with elevation   Active ROM Right eval Left eval  Shoulder flexion [facility]  Shoulder extension    Shoulder abduction [facility]  Shoulder adduction    Shoulder extension    Shoulder internal rotation    Shoulder external rotation    Elbow flexion    Elbow extension    Wrist flexion    Wrist extension    Wrist ulnar deviation    Wrist radial deviation    Wrist pronation    Wrist supination     (Blank rows = not tested)  UPPER EXTREMITY MMT:  MMT Right eval Left eval  Shoulder flexion    Shoulder extension    Shoulder abduction    Shoulder adduction    Shoulder extension    Shoulder internal rotation    Shoulder external rotation    Middle trapezius 4- 4  Lower trapezius 4- 4  Elbow flexion    Elbow extension    Wrist flexion    Wrist extension    Wrist ulnar deviation    Wrist radial deviation    Wrist pronation    Wrist supination    Grip  strength     (Blank rows = not tested)  CERVICAL SPECIAL TESTS:  Spurling's test: Negative, and Distraction test: Negative   [facility] Adult PT Treatment:                                                DATE: 01/07/23 Therapeutic Exercise: Standing Chin tuck with noodle 5 sec x 5 Scap squeeze with noodle 5 sec x 5  L's with noodle 3 sec x 10 W's with noodle 3 sec x 10  Nodding yes/no 10-15 Corner stretch 3 positions 20-30 sec hold x 2 each position(hold for now)   Manual Therapy: Skilled palpation to assess response to manual work and dry needling  Trigger Point Dry-Needling  Treatment instructions: Expect mild to  moderate muscle soreness. S/S of pneumothorax if dry needled over a lung field, and to seek immediate medical attention should they occur. Patient verbalized understanding of these instructions and education.  Patient Consent Given: Yes Education handout provided: Previously provided Muscles treated: cervical paraspinals R/L 3 needles each side  Electrical stimulation performed: No Parameters: N/A Treatment response/outcome: decreased palpable tightness   Neuromuscular re-ed: Postural education and correction  Therapeutic Activity: Myofacial ball release work standing 4 inch ball on wall  Modalities: Moist heat cervical spine x 10 min  Self Care: Use of noodle for sitting  Ball release work   [facility] Adult PT Treatment:                                                DATE: 12/29/22 Therapeutic Exercise: Standing Chin tuck with noodle 5 sec x 5 Scap squeeze with noodle 5 sec x 5  L's with noodle 3 sec x 10 W's with noodle 3 sec x 10  Nodding yes/no 10-15 Corner stretch 3 positions 20-30 sec hold x 2 each position(hold for now)   Manual Therapy: Skilled palpation to assess response to manual work and dry needling  Trigger Point Dry-Needling  Treatment instructions: Expect mild to moderate muscle soreness. S/S of pneumothorax if dry needled over a lung field, and to seek  immediate medical attention should they occur. Patient verbalized understanding of these instructions and education.  Patient Consent Given: Yes Education handout provided: Previously provided Muscles treated: cervical paraspinals R/L 3 needles each side  Electrical stimulation performed: No Parameters: N/A Treatment response/outcome: decreased palpable tightness   Neuromuscular re-ed: Postural education and correction  Therapeutic Activity: Myofacial ball release work standing 4 inch ball on wall  Modalities: Moist heat cervical spine x 10 min  Self Care: Use of noodle for sitting  Ball release work  [facility] Adult PT Treatment:                                                DATE: 12/23/22 Therapeutic Exercise: Standing Chin tuck with noodle 5 sec x 5 Scap squeeze with noodle 5 sec x 5  L's with noodle 3 sec x 10 W's with noodle 3 sec x 10  Corner stretch 3 positions 20-30 sec hold x 2 each position  Manual Therapy: Add trial of DN Neuromuscular re-ed: Postural education and correction  Therapeutic Activity: Myofacial ball release work standing 4 inch ball on wall  Modalities:  Self Care: Use of noodle for sitting  Ball release work   PATIENT EDUCATION:  Education details: HEP; POC Person educated: Patient Education method: Programmer, multimedia, Facilities manager, Actor cues, Verbal cues, and Handouts Education comprehension: verbalized understanding, returned demonstration, verbal cues required, tactile cues required, and needs further education  HOME EXERCISE PROGRAM: Access Code: [email or web address] URL: [email or web address] Date: 12/23/2022 Prepared by: Corlis Leak  Exercises - Seated Cervical Retraction  - 2 x daily - 7 x weekly - 1-2 sets - 5-10 reps - 10 sec  hold - Seated Scapular Retraction  - 2 x daily - 7 x weekly - 1-2 sets - 10 reps - 10 sec  hold - Shoulder External Rotation and Scapular Retraction  - 3 x daily - 7 x weekly - 1 sets -  10 reps - 3-5 sec    hold - Standing Shoulder W at Wall  - 1-2 x daily - 7 x weekly - 1 sets - 10 reps - 3 sec  hold - Doorway Pec Stretch at 60 Degrees Abduction  - 3 x daily - 7 x weekly - 1 sets - 3 reps - Doorway Pec Stretch at 90 Degrees Abduction  - 3 x daily - 7 x weekly - 1 sets - 3 reps - 30 seconds  hold - Doorway Pec Stretch at 120 Degrees Abduction  - 3 x daily - 7 x weekly - 1 sets - 3 reps - 30 second hold  hold - Standing Pectoral Release with Ball at Wall  - 3-4 x daily - 7 x weekly - Standing Infraspinatus/Teres Minor Release with Ball at Wall  - 2 x daily - 7 x weekly  Patient Education - Hospital doctor - Trigger Point Dry Needling  ASSESSMENT:  CLINICAL IMPRESSION: Patient as been sick for the past two weeks and has been unable to do exercises. She had mixed results from the dry needling but would like to try needling again. Reviewed exercises and encouraged patient to try to exercise consistently now that she is feeling better. Reviewed plan including gentle exercises and encouraged patient to start with exercises and a walking program starting with 5 min and gradually increasing - level surfaces air conditioned space. Trial of DN for posterior cervical spine; upper trap bilat 3 needles each side. Tolerated well. Discussed expectations for therapy in light of chronic pain.    Evaluation: Patient is a 57 y.o. female who was seen today for physical therapy evaluation and treatment for cervicalgia and headache which I chronic in nature over the past 20+ years with flare up of symptoms in the past 6 months. Patient has a history of cervical cyst at C7 with VP shunt placement R; cervical ACDF C5/6; R first rib resection ~ 40 yrs ago; lumbar spine fusion ?level; L elbow tendon repair with ORIF; chronic pain; retired on disability ~ 25 yrs. Patient presents clinically with poor posture and alignment; limited cervical and shoulder ROM/mobility; significant muscular tightness through cervical and  upper body; poor movement patterns; pain on a daily basis. Patient will benefit from PT to address problems identified.   OBJECTIVE IMPAIRMENTS: decreased mobility, decreased ROM, decreased strength, hypomobility, increased fascial restrictions, increased muscle spasms, impaired flexibility, impaired UE functional use, improper body mechanics, postural dysfunction, and pain.    GOALS: Goals reviewed with patient? Yes  SHORT TERM GOALS: Target date: 02/03/2023   Independent in initial HEP  Baseline:  Goal status: INITIAL  2.  Patient will tolerate 20 minutes of stretching, strengthening, postural correction with minimal to no increase in pain  Baseline:  Goal status: INITIAL   LONG TERM GOALS: Target date: 03/17/2023   Improve posture and alignment with patient to demonstrate improve upright posture with posterior shoulder girdle engaged and improved spinal alignment  Baseline:  Goal status: INITIAL  2.  Decrease frequency, intensity and/or duration of pain by 25-50% allowing patient to be more active with ADL's and exercise  Baseline:  Goal status: INITIAL  3.  Increase cervical ROM by 3-5 degrees in all planes  Baseline:  Goal status: INITIAL  4.  Increase posterior shoulder girdle strength to 5/5  Baseline:  Goal status: INITIAL  5.  Independent in HEP including aquatic therapy as indicated  Baseline:  Goal status: INITIAL  6.  Improve functional limitation  score to 50  Baseline:  Goal status: INITIAL   PLAN:  PT FREQUENCY: 2x/week  PT DURATION: 12 weeks  PLANNED INTERVENTIONS: Therapeutic exercises, Therapeutic activity, Neuromuscular re-education, Patient/Family education, Self Care, Joint mobilization, Aquatic Therapy, Dry Needling, Electrical stimulation, Spinal mobilization, Cryotherapy, Moist heat, Taping, Traction, Ultrasound, Ionotophoresis 4mg /ml Dexamethasone, Manual therapy, and Re-evaluation  PLAN FOR NEXT SESSION: review and progress exercises;  continue with postural correction and education; manual work, DN, modalities as indicated    [employer], PT 01/07/2023, 8:10 AM

## 2023-01-11 ENCOUNTER — Encounter: Payer: Self-pay | Admitting: Rehabilitative and Restorative Service Providers"

## 2023-01-11 ENCOUNTER — Ambulatory Visit: Payer: Medicare HMO | Admitting: Rehabilitative and Restorative Service Providers"

## 2023-01-11 DIAGNOSIS — R293 Abnormal posture: Secondary | ICD-10-CM

## 2023-01-11 DIAGNOSIS — M62838 Other muscle spasm: Secondary | ICD-10-CM | POA: Diagnosis not present

## 2023-01-11 DIAGNOSIS — M542 Cervicalgia: Secondary | ICD-10-CM

## 2023-01-11 DIAGNOSIS — G4486 Cervicogenic headache: Secondary | ICD-10-CM | POA: Diagnosis not present

## 2023-01-11 DIAGNOSIS — R42 Dizziness and giddiness: Secondary | ICD-10-CM | POA: Diagnosis not present

## 2023-01-11 NOTE — Therapy (Addendum)
 OUTPATIENT PHYSICAL THERAPY CERVICAL EVALUATION PHYSICAL THERAPY DISCHARGE SUMMARY  Visits from Start of Care: 4  Current functional level related to goals / functional outcomes: See progress note for discharge status    Remaining deficits: Unknown    Education / Equipment: HEP    Patient agrees to discharge. Patient goals were partially met. Patient is being discharged due to not returning since the last visit.  Claira Jeter P. Leonor Liv PT, MPH 07/27/23 12:22 PM     Patient Name: Katrina Fernandez MRN: [id number] DOB:1965/12/02, 57 y.o., female Today's Date: 01/11/2023  END OF SESSION:  PT End of Session - 01/11/23 0811     Visit Number 4    Number of Visits 24    Date for PT Re-Evaluation 03/17/23    Authorization Type [employer]    Progress Note Due on Visit 10    PT Start Time 0806    PT Stop Time 0849    PT Time Calculation (min) 43 min    Activity Tolerance Patient tolerated treatment well             Past Medical History:  Diagnosis Date   Anemia    Anxiety    Arthritis    "back and knees" (07/23/2016)   Chiari malformation type I (HCC) dx'd 2014   Chronic back pain    buldging, DDD; "neck and lower back" (07/23/2016)   Chronic constipation    takes Miralax daily   Chronic pain syndrome    takes Methadone and Keppra daily   Depression    takes Effexor daily   Endometriosis    1995   Fibromyalgia    GERD (gastroesophageal reflux disease)    not taking anything at the present time   Headache    History of DVT of lower extremity 07/2012   LEFT LEG --  3/ 2014.Was on a blood transfusion for 6 months   History of kidney stones    History of MRSA infection 10 yrs ago   Hyperlipidemia    takes Simvastatin daily   Migraine    "daily recently" (07/23/2016)   Muscle spasm    takes Zanaflex nightly   Peripheral edema    takes Lasix daily as needed   Peripheral neuropathy    Pleurisy 2000   Pneumonia    hx of-7-8 yrs ago   PONV (postoperative nausea and  vomiting)    Seasonal allergies    takes Claritin daily   Spinal headache    "since 2002; still get them; try to manage it w/lying flat"   Past Surgical History:  Procedure Laterality Date   ANTERIOR CERVICAL DECOMP/DISCECTOMY FUSION  07/25/2009   C5 -- C6   APPENDECTOMY  age 26   APPLICATION OF CRANIAL NAVIGATION N/A 06/23/2017   Procedure: APPLICATION OF CRANIAL NAVIGATION;  Surgeon: Donalee Citrin, MD;  Location[facility] OR;  Service: Neurosurgery;  Laterality: N/A;   BACK SURGERY     BRAIN SURGERY     VP shunt   BREAST REDUCTION SURGERY Bilateral 06/07/2013   Procedure: BILATERAL MAMMARY REDUCTION  (BREAST);  Surgeon: Wayland Denis, DO;  Location: [facility];  Service: Plastics;  Laterality: Bilateral;   BREAST REDUCTION SURGERY Bilateral 06/07/2013   Procedure:  LIPOSUCTION;  Surgeon: Wayland Denis, DO;  Location:  SURGERY CENTER;  Service: Plastics;  Laterality: Bilateral;   CARPAL TUNNEL RELEASE Bilateral 2001   COLONOSCOPY  X 2   "polyps were benign"   CYSTOSCOPY WITH RETROGRADE PYELOGRAM, URETEROSCOPY AND STENT PLACEMENT  Left 05/2021   (in my chart)   DILATION AND CURETTAGE OF UTERUS     ESOPHAGOGASTRODUODENOSCOPY     gastric banding undone  11/2014   LAPAROSCOPIC CHOLECYSTECTOMY  ~ 2000   LAPAROSCOPIC GASTRIC BANDING  2011   LAPAROSCOPIC REVISION VENTRICULAR-PERITONEAL (V-P) SHUNT N/A 06/23/2017   Procedure: Shunt Placment stereotactic ventricular catheter placement and general surgery abdominal exposure  (Laparoscopic vs. Open) Brain Lab;  Surgeon: Berna Bue, MD;  Location: [facility] OR;  Service: General;  Laterality: N/A;   LAPAROSCOPY N/A 06/23/2017   Procedure: LAPAROSCOPY DIAGNOSTIC;  Surgeon: Berna Bue, MD;  Location: [facility] OR;  Service: General;  Laterality: N/A;   LUMBAR LAMINECTOMY/DECOMPRESSION MICRODISCECTOMY N/A 07/23/2016   Procedure: Decompressive Lumbar Laminectomy Lumbar two-three  for Repair of  Cerberal Spinal Fluid  Leak;   Surgeon: Donalee Citrin, MD;  Location: [facility] OR;  Service: Neurosurgery;  Laterality: N/A;   LUMBAR LAMINECTOMY/DECOMPRESSION MICRODISCECTOMY Left 03/01/2019   Procedure: LEFT LUMBAR THREE-FOUR EXTRAFORAMINAL MICRODISCECTOMY;  Surgeon: Donalee Citrin, MD;  Location: [facility] OR;  Service: Neurosurgery;  Laterality: Left;   PLACEMENT LUMBOPERITONEAL SHUNT FOR CEREBROSPINAL FLUID DIVERSION AWAY FROM C8 PERINEURAL CYST  09-05-2003  &  12-24-2003   08-31-2003  REMOVAL SHUNT   POSTERIOR CERVICAL LAMINECTOMY  03/01/2001   C7-8 and Exploration C8 perineural cyst   POSTERIOR LUMBAR LAMINECTOMY L4-5/  DISKECTOMY L5-S1  05/20/2005   RE-DO DECOMPRESSION LAMINECTOMY AND FUSION L4-5  &  L5-S1  12/20/2006   02-04-2007--  RE-EXPLORATION L4 to S1, I & D LUMBAR WOUND HEMATOMA   REDUCTION MAMMAPLASTY     SHUNT REMOVAL N/A 07/10/2016   Procedure: SHUNT REMOVAL;  Surgeon: Donalee Citrin, MD;  Location[facility] OR;  Service: Neurosurgery;  Laterality: N/A;  SHUNT REMOVAL   SPINAL CORD STIMULATOR INSERTION N/A 04/26/2015   Procedure: LUMBAR SPINAL CORD STIMULATOR INSERTION;  Surgeon: Odette Fraction, MD;  Location: [facility];  Service: Neurosurgery;  Laterality: N/A;  LUMBAR SPINAL CORD STIMULATOR INSERTION   SPINAL CORD STIMULATOR REMOVAL N/A 06/28/2019   Procedure: Removal of dorsal column stimulator and generator -;  Surgeon: Donalee Citrin, MD;  Location: [facility] OR;  Service: Neurosurgery;  Laterality: N/A;   TENNIS ELBOW RELEASE/NIRSCHEL PROCEDURE Left 12/13/2014   Procedure: LEFT ELBOW LATERAL EPICONDYLAR DEBRIDEMENT AND OR REPAIR -RECONSTRUCTION ;  Surgeon: Bradly Bienenstock, MD;  Location:  SURGERY CENTER;  Service: Orthopedics;  Laterality: Left;   TOTAL ABDOMINAL HYSTERECTOMY  1994   w/  Bilateral Salpinoophorectomy   VENTRICULOPERITONEAL SHUNT N/A 06/23/2017   Procedure: VENTRICULAR-PERITONEAL SHUNT INSERTION WITH ABDOMINAL LAPARASCOPY AND BRAINLAB NAVIGATION;  Surgeon: Donalee Citrin, MD;  Location: [facility] OR;  Service: Neurosurgery;   Laterality: N/A;   Patient Active Problem List   Diagnosis Date Noted   Anxiety 01/05/2023   Chronic dyspnea 09/04/2022   Chest pain on breathing 04/23/2022   Dizziness 02/05/2022   AKI (acute kidney injury) (HCC) 06/22/2021   Occipital neuralgia of right side 05/01/2021   Sinobronchitis 03/07/2021   Eczema 02/19/2021   Flank pain with history of urolithiasis 11/29/2020   Shingles 09/02/2020   URI (upper respiratory infection) 08/21/2020   Elevated blood pressure reading 02/13/2020   Abnormal urinalysis 02/13/2020   Nephrolithiasis 02/08/2020   Hematuria 11/26/2019   Other fatigue 11/26/2019   Bronchitis 09/12/2019   COVID-19 07/26/2019   HNP (herniated nucleus pulposus), lumbar 03/01/2019   Cubital tunnel syndrome on left 03/17/2018   Plantar fasciitis of left foot 03/17/2018   CSF leak 06/23/2017   Trochanteric bursitis, left hip 06/09/2017  Pseudotumor cerebri 03/31/2017   Obesity 09/15/2016   Abnormal weight gain 09/03/2016   Chiari I malformation (HCC) 07/09/2016   Prediabetes 02/21/2016   RLS (restless legs syndrome) 10/23/2015   Seasonal allergies 10/23/2015   Hyperlipidemia 07/17/2015   Osteoarthritis of carpometacarpal joint of left thumb 06/28/2015   Fibromyalgia 05/30/2015   Chronic back pain 05/09/2015   Therapeutic opioid-induced constipation (OIC) 01/31/2015   Insomnia 07/09/2014   Generalized anxiety disorder 04/03/2014   Spider angioma 10/11/2013   Chronic venous insufficiency 09/25/2013   Symptomatic mammary hypertrophy 05/30/2013   Nevus, non-neoplastic 08/01/2012   Pain syndrome, chronic 06/20/2012   Postlaminectomy syndrome of lumbar region 06/20/2012   Primary osteoarthritis of both knees 02/08/2012   MDD (major depressive disorder) 01/28/2012   Subluxation of peroneal tendon of right foot 01/28/2012   GERD (gastroesophageal reflux disease) 01/28/2012   Complication of gastric band procedure 08/26/2011    PCP: Dr Everrett Coombe  REFERRING  PROVIDER: Dr Donalee Citrin  REFERRING DIAG: Cervicalgia  THERAPY DIAG:  Abnormal posture  Other muscle spasm  Cervicogenic headache  Cervicalgia  Rationale for Evaluation and Treatment: Rehabilitation  ONSET DATE: 06/18/22  SUBJECTIVE:                                                                                                                                                                                                         SUBJECTIVE STATEMENT: Patient reports that she is feeling better after COVID. She has continued pain and tightness. She has no headache today. Discussed beginning gentle exercises and a walking program. She has been feeling better.   Evaluation: Patient reports that she had MRI of neck from the pain clinic and showed degenerative changes. She was seen for 3-4 visits in May with no change in symptoms. Some days are worse than others. She has a cyst at C7 which is inoperable. She has underwent cervical fusion ~ 1999 above the C7 level. She has had neck pain for > 25 years. She had a LP shunt to address fluid in the cyst. She had a VP shunt ~ 5 years which has helped the headaches. About 6 months ago she had increased neck pain and a feeling of swelling in the back of her neck and difficulty putting her head back. She has occasional pain in the L UE several days a week.    Hand dominance: Right  PERTINENT HISTORY:  Resection of R first rib ~ 40 yrs ago; Trigger fingers R long and ring fingers; LBP with fusion lumbar spine ~ 10 yrs ago; cervical fusion;  VP shunt; hysterectomy; gall bladder; L elbow surgery for torn tendon; anxiety; depression  PAIN:  Are you having pain? Yes: NPRS scale: 4/10 Pain location: back of neck  Pain description: aching throbbing  Aggravating factors: looking up; increased activities   Relieving factors: ice; lying down; meds at times   PRECAUTIONS: Other: VP shunt R anterior/lateral cervical area    WEIGHT BEARING RESTRICTIONS:  No  FALLS:  Has patient fallen in last 6 months? No  LIVING ENVIRONMENT: Lives with: lives with their family Lives in: House/apartment  OCCUPATION: retired on disability ~ 20 years  Household chores; 66 yr old daughter; walking ~ 3 times a week ~ 30 min  PATIENT GOALS: decrease neck pain and avoid surgery   NEXT MD VISIT: 9/24  OBJECTIVE:   DIAGNOSTIC FINDINGS:  CT cervical spine 10/17/21: 1. No acute osseous abnormality or evidence of significant degenerative spondylosis of the cervical spine. 2. Prior C5-6 ACDF with solid interbody fusion. 3. Unchanged congenital findings spinal dysraphism. Grossly stable right C7 perineural root sleeve cyst.  PATIENT SURVEYS:  FOTO 40; goal 50    SENSATION: WFL  POSTURE: Patient presents with head forward posture with increased thoracic kyphosis; shoulders rounded and elevated; scapulae abducted and rotated along the thoracic spine; head of the humerus anterior in orientation.   PALPATION: Significant muscular tightness R > L anterior/lateral/posterior cervical musculature; pecs; upper traps; leveator; teres; lats    CERVICAL ROM:   Active ROM A/PROM (deg) eval  Flexion 29 tight, pull  Extension 38 tight, pull  Right lateral flexion 27 tight   Left lateral flexion 25 tight   Right rotation 48  Left rotation 50   (Blank rows = not tested)  UPPER EXTREMITY ROM: end range tightness with elevation   Active ROM Right eval Left eval  Shoulder flexion [facility]  Shoulder extension    Shoulder abduction [facility]  Shoulder adduction    Shoulder extension    Shoulder internal rotation    Shoulder external rotation    Elbow flexion    Elbow extension    Wrist flexion    Wrist extension    Wrist ulnar deviation    Wrist radial deviation    Wrist pronation    Wrist supination     (Blank rows = not tested)  UPPER EXTREMITY MMT:  MMT Right eval Left eval  Shoulder flexion    Shoulder extension    Shoulder abduction     Shoulder adduction    Shoulder extension    Shoulder internal rotation    Shoulder external rotation    Middle trapezius 4- 4  Lower trapezius 4- 4  Elbow flexion    Elbow extension    Wrist flexion    Wrist extension    Wrist ulnar deviation    Wrist radial deviation    Wrist pronation    Wrist supination    Grip strength     (Blank rows = not tested)  CERVICAL SPECIAL TESTS:  Spurling's test: Negative, and Distraction test: Negative   [facility] Adult PT Treatment:                                                DATE: 01/11/23 Therapeutic Exercise: Standing Chin tuck with corgeous ball 5 sec x 5 Scap squeeze with corgeous ball 5 sec x 5  L's with corgeous ball  yellow TB 3 sec x 10 W's with corgeous ball yellow TB 3 sec x 10  Nodding yes/no 10-15 Doorway stretch 3 positions 20-30 sec hold x 2 each position   Backward shoulder rolls x 10 Lateral cervical flexion 5 sec x 3 reps R/L  Supine transverse abdominals - 4 part core x 10   Neuromuscular re-ed: Postural education and correction  Therapeutic Activity: Myofacial ball release work standing 4 inch ball on wall  Modalities: Moist heat cervical spine x 10 min  Self Care: Use of noodle for sitting  Ball release work  [facility] Adult PT Treatment:                                                DATE: 01/07/23 Therapeutic Exercise: Standing Chin tuck with noodle 5 sec x 5 Scap squeeze with noodle 5 sec x 5  L's with noodle 3 sec x 10 W's with noodle 3 sec x 10  Nodding yes/no 10-15 Corner stretch 3 positions 20-30 sec hold x 2 each position(hold for now)   Manual Therapy: Skilled palpation to assess response to manual work and dry needling  Trigger Point Dry-Needling  Treatment instructions: Expect mild to moderate muscle soreness. S/S of pneumothorax if dry needled over a lung field, and to seek immediate medical attention should they occur. Patient verbalized understanding of these instructions and education.  Patient  Consent Given: Yes Education handout provided: Previously provided Muscles treated: cervical paraspinals R/L 3 needles each side  Electrical stimulation performed: No Parameters: N/A Treatment response/outcome: decreased palpable tightness   Neuromuscular re-ed: Postural education and correction  Therapeutic Activity: Myofacial ball release work standing 4 inch ball on wall  Modalities: Moist heat cervical spine x 10 min  Self Care: Use of noodle for sitting  Ball release work   PATIENT EDUCATION:  Education details: HEP; POC Person educated: Patient Education method: Programmer, multimedia, Facilities manager, Actor cues, Verbal cues, and Handouts Education comprehension: verbalized understanding, returned demonstration, verbal cues required, tactile cues required, and needs further education  HOME EXERCISE PROGRAM: Access Code: [email or web address] URL: [email or web address] Date: 01/11/2023 Prepared by: Corlis Leak  Exercises - Seated Cervical Retraction  - 2 x daily - 7 x weekly - 1-2 sets - 5-10 reps - 10 sec  hold - Seated Scapular Retraction  - 2 x daily - 7 x weekly - 1-2 sets - 10 reps - 10 sec  hold - Shoulder External Rotation and Scapular Retraction  - 3 x daily - 7 x weekly - 1 sets - 10 reps - 3-5 sec   hold - Standing Shoulder W at Wall  - 1-2 x daily - 7 x weekly - 1 sets - 10 reps - 3 sec  hold - Doorway Pec Stretch at 60 Degrees Abduction  - 3 x daily - 7 x weekly - 1 sets - 3 reps - Doorway Pec Stretch at 90 Degrees Abduction  - 3 x daily - 7 x weekly - 1 sets - 3 reps - 30 seconds  hold - Doorway Pec Stretch at 120 Degrees Abduction  - 3 x daily - 7 x weekly - 1 sets - 3 reps - 30 second hold  hold - Standing Pectoral Release with Ball at Wall  - 3-4 x daily - 7 x weekly - Standing Infraspinatus/Teres Minor Release with Ball at Wall  - 2 x  daily - 7 x weekly - Shoulder External Rotation and Scapular Retraction with Resistance  - 2 x daily - 7 x weekly - 1 sets - 10 reps  - 3-5 sec  hold - Shoulder W - External Rotation with Resistance  - 2 x daily - 7 x weekly - 1-2 sets - 10 reps - 3 sec  hold - Standing Backward Shoulder Rolls  - 2 x daily - 7 x weekly - 1 sets - 10 reps - 1-2 sec  hold - Seated Cervical Sidebending AROM  - 2 x daily - 7 x weekly - 1 sets - 3-5 reps - 5-10 sec  hold - Supine Transversus Abdominis Bracing with Pelvic Floor Contraction  - 2 x daily - 7 x weekly - 1 sets - 10 reps - 10sec  hold - Supine Diaphragmatic Breathing  - 2 x daily - 7 x weekly - 1 sets - 10 reps - 4-6 sec  hold  Patient Education - Hospital doctor - Trigger Point Dry Needling  ASSESSMENT:  CLINICAL IMPRESSION: Patient is feeling better and headache free today. She continues to have increased HA following DN which we did not do today. Focus was on posture and alignment as well as gentle postural exercises and diaphragmatic breathing. Patient tolerated treatment well. Discussed expectations for therapy in light of chronic pain.    Evaluation: Patient is a 58 y.o. female who was seen today for physical therapy evaluation and treatment for cervicalgia and headache which I chronic in nature over the past 20+ years with flare up of symptoms in the past 6 months. Patient has a history of cervical cyst at C7 with VP shunt placement R; cervical ACDF C5/6; R first rib resection ~ 40 yrs ago; lumbar spine fusion ?level; L elbow tendon repair with ORIF; chronic pain; retired on disability ~ 25 yrs. Patient presents clinically with poor posture and alignment; limited cervical and shoulder ROM/mobility; significant muscular tightness through cervical and upper body; poor movement patterns; pain on a daily basis. Patient will benefit from PT to address problems identified.   OBJECTIVE IMPAIRMENTS: decreased mobility, decreased ROM, decreased strength, hypomobility, increased fascial restrictions, increased muscle spasms, impaired flexibility, impaired UE functional use,  improper body mechanics, postural dysfunction, and pain.    GOALS: Goals reviewed with patient? Yes  SHORT TERM GOALS: Target date: 02/03/2023   Independent in initial HEP  Baseline:  Goal status: INITIAL  2.  Patient will tolerate 20 minutes of stretching, strengthening, postural correction with minimal to no increase in pain  Baseline:  Goal status: INITIAL   LONG TERM GOALS: Target date: 03/17/2023   Improve posture and alignment with patient to demonstrate improve upright posture with posterior shoulder girdle engaged and improved spinal alignment  Baseline:  Goal status: INITIAL  2.  Decrease frequency, intensity and/or duration of pain by 25-50% allowing patient to be more active with ADL's and exercise  Baseline:  Goal status: INITIAL  3.  Increase cervical ROM by 3-5 degrees in all planes  Baseline:  Goal status: INITIAL  4.  Increase posterior shoulder girdle strength to 5/5  Baseline:  Goal status: INITIAL  5.  Independent in HEP including aquatic therapy as indicated  Baseline:  Goal status: INITIAL  6.  Improve functional limitation score to 50  Baseline:  Goal status: INITIAL   PLAN:  PT FREQUENCY: 2x/week  PT DURATION: 12 weeks  PLANNED INTERVENTIONS: Therapeutic exercises, Therapeutic activity, Neuromuscular re-education, Patient/Family education, Self Care, Joint mobilization,  Aquatic Therapy, Dry Needling, Electrical stimulation, Spinal mobilization, Cryotherapy, Moist heat, Taping, Traction, Ultrasound, Ionotophoresis 4mg /ml Dexamethasone, Manual therapy, and Re-evaluation  PLAN FOR NEXT SESSION: review and progress exercises; continue with postural correction and education; manual work, DN, modalities as indicated    [employer], PT 01/11/2023, 8:11 AM

## 2023-01-14 ENCOUNTER — Ambulatory Visit: Payer: Medicare HMO | Admitting: Rehabilitative and Restorative Service Providers"

## 2023-01-21 DIAGNOSIS — Z79899 Other long term (current) drug therapy: Secondary | ICD-10-CM | POA: Diagnosis not present

## 2023-01-21 DIAGNOSIS — Z981 Arthrodesis status: Secondary | ICD-10-CM | POA: Diagnosis not present

## 2023-01-21 DIAGNOSIS — M961 Postlaminectomy syndrome, not elsewhere classified: Secondary | ICD-10-CM | POA: Diagnosis not present

## 2023-01-21 DIAGNOSIS — M542 Cervicalgia: Secondary | ICD-10-CM | POA: Diagnosis not present

## 2023-01-21 DIAGNOSIS — M5417 Radiculopathy, lumbosacral region: Secondary | ICD-10-CM | POA: Diagnosis not present

## 2023-01-21 DIAGNOSIS — M25559 Pain in unspecified hip: Secondary | ICD-10-CM | POA: Diagnosis not present

## 2023-01-22 ENCOUNTER — Encounter (HOSPITAL_COMMUNITY): Payer: Self-pay

## 2023-01-26 DIAGNOSIS — Z6826 Body mass index (BMI) 26.0-26.9, adult: Secondary | ICD-10-CM | POA: Diagnosis not present

## 2023-01-26 DIAGNOSIS — M542 Cervicalgia: Secondary | ICD-10-CM | POA: Diagnosis not present

## 2023-01-28 ENCOUNTER — Other Ambulatory Visit: Payer: Self-pay | Admitting: Family Medicine

## 2023-01-30 ENCOUNTER — Other Ambulatory Visit: Payer: Self-pay | Admitting: Family Medicine

## 2023-02-01 NOTE — Telephone Encounter (Signed)
Pharmacy requesting refill. Per instructions, limited dosing. Please advise.

## 2023-02-04 ENCOUNTER — Ambulatory Visit (INDEPENDENT_AMBULATORY_CARE_PROVIDER_SITE_OTHER): Payer: Medicare HMO | Admitting: Sports Medicine

## 2023-02-04 ENCOUNTER — Encounter: Payer: Self-pay | Admitting: Sports Medicine

## 2023-02-04 DIAGNOSIS — M19071 Primary osteoarthritis, right ankle and foot: Secondary | ICD-10-CM | POA: Diagnosis not present

## 2023-02-04 DIAGNOSIS — M170 Bilateral primary osteoarthritis of knee: Secondary | ICD-10-CM

## 2023-02-04 DIAGNOSIS — M7712 Lateral epicondylitis, left elbow: Secondary | ICD-10-CM

## 2023-02-04 MED ORDER — ACETAMINOPHEN ER 650 MG PO TBCR
650.0000 mg | EXTENDED_RELEASE_TABLET | Freq: Three times a day (TID) | ORAL | Status: AC | PRN
Start: 2023-02-04 — End: ?

## 2023-02-04 NOTE — Progress Notes (Signed)
    Procedures performed today:    None.  Independent interpretation of notes and tests performed by another provider:   None.  Brief History, Exam, Impression, and Recommendations:    Primary osteoarthritis of both knees Known knee osteoarthritis, recurrence of knee pain, last injection was in July 2020. Pain is medial joint line, posterior, of note she has needed CT arthrography due to metal implants in the past. Today we are can do arthritis strength Tylenol, updated x-rays and home physical therapy, if insufficient improvement we will proceed with injection.  Primary osteoarthritis of right ankle X-rays from 2018 showed diffuse osteoarthritis, we treated her conservatively and things improved, she is having recurrence of pain, pending updated x-rays, Tylenol, home PT, return to see me in approximately 4 weeks and we can do an ankle joint injection if not better.  Left tennis elbow History of common extensor tendon debridement, now with a palpable lump, I suspect this is an osteophyte, adding some x-rays. Only minimal discomfort.    ____________________________________________ Ihor Austin. Benjamin Stain, M.D., ABFM., CAQSM., AME. Primary Care and Sports Medicine [facility]  Adjunct Professor of Family Medicine  [employer] [facility] [employer]  Restaurant manager, fast food

## 2023-02-04 NOTE — Assessment & Plan Note (Signed)
Known knee osteoarthritis, recurrence of knee pain, last injection was in July 2020. Pain is medial joint line, posterior, of note she has needed CT arthrography due to metal implants in the past. Today we are can do arthritis strength Tylenol, updated x-rays and home physical therapy, if insufficient improvement we will proceed with injection.

## 2023-02-04 NOTE — Assessment & Plan Note (Signed)
X-rays from 2018 showed diffuse osteoarthritis, we treated her conservatively and things improved, she is having recurrence of pain, pending updated x-rays, Tylenol, home PT, return to see me in approximately 4 weeks and we can do an ankle joint injection if not better.

## 2023-02-04 NOTE — Assessment & Plan Note (Signed)
History of common extensor tendon debridement, now with a palpable lump, I suspect this is an osteophyte, adding some x-rays. Only minimal discomfort.

## 2023-02-08 ENCOUNTER — Encounter: Payer: Self-pay | Admitting: Sports Medicine

## 2023-02-08 NOTE — Telephone Encounter (Signed)
I ordered all of her x-rays and it does not look like she has even gotten them done yet.

## 2023-02-09 ENCOUNTER — Ambulatory Visit: Payer: Medicare HMO

## 2023-02-09 DIAGNOSIS — M7712 Lateral epicondylitis, left elbow: Secondary | ICD-10-CM | POA: Diagnosis not present

## 2023-02-09 DIAGNOSIS — M19071 Primary osteoarthritis, right ankle and foot: Secondary | ICD-10-CM | POA: Diagnosis not present

## 2023-02-09 DIAGNOSIS — M170 Bilateral primary osteoarthritis of knee: Secondary | ICD-10-CM | POA: Diagnosis not present

## 2023-02-09 DIAGNOSIS — Z0389 Encounter for observation for other suspected diseases and conditions ruled out: Secondary | ICD-10-CM | POA: Diagnosis not present

## 2023-02-09 DIAGNOSIS — M25561 Pain in right knee: Secondary | ICD-10-CM | POA: Diagnosis not present

## 2023-02-09 DIAGNOSIS — M25562 Pain in left knee: Secondary | ICD-10-CM | POA: Diagnosis not present

## 2023-02-15 ENCOUNTER — Encounter: Payer: Self-pay | Admitting: *Deleted

## 2023-02-15 ENCOUNTER — Ambulatory Visit (INDEPENDENT_AMBULATORY_CARE_PROVIDER_SITE_OTHER): Payer: Medicare HMO | Admitting: Family Medicine

## 2023-02-15 ENCOUNTER — Encounter: Payer: Self-pay | Admitting: Family Medicine

## 2023-02-15 ENCOUNTER — Ambulatory Visit: Payer: Medicare HMO

## 2023-02-15 ENCOUNTER — Ambulatory Visit
Admission: EM | Admit: 2023-02-15 | Discharge: 2023-02-15 | Disposition: A | Discharge status: Home / Self Care | Payer: Medicare HMO

## 2023-02-15 VITALS — BP 128/81 | HR 96 | Ht 63.0 in | Wt 152.0 lb

## 2023-02-15 DIAGNOSIS — Z23 Encounter for immunization: Secondary | ICD-10-CM | POA: Diagnosis not present

## 2023-02-15 DIAGNOSIS — M797 Fibromyalgia: Secondary | ICD-10-CM

## 2023-02-15 DIAGNOSIS — R2242 Localized swelling, mass and lump, left lower limb: Secondary | ICD-10-CM | POA: Diagnosis not present

## 2023-02-15 DIAGNOSIS — M79605 Pain in left leg: Secondary | ICD-10-CM | POA: Diagnosis not present

## 2023-02-15 DIAGNOSIS — G629 Polyneuropathy, unspecified: Secondary | ICD-10-CM

## 2023-02-15 DIAGNOSIS — R600 Localized edema: Secondary | ICD-10-CM | POA: Diagnosis not present

## 2023-02-15 DIAGNOSIS — M79662 Pain in left lower leg: Secondary | ICD-10-CM | POA: Diagnosis not present

## 2023-02-15 MED ORDER — AMITRIPTYLINE HCL 50 MG PO TABS: 50.0000 mg | ORAL_TABLET | Freq: Every day | ORAL | 0 refills | Status: DC

## 2023-02-15 NOTE — Discharge Instructions (Addendum)
Advised patient of ultrasound results with hardcopy provided to patient.  Advised fluid collection probable ganglion cyst and to follow-up with orthopedics for further evaluation.  Contact information provided with this AVS today.  Advised if symptoms worsen and/or unresolved please follow-up with PCP or here for further evaluation.

## 2023-02-15 NOTE — Assessment & Plan Note (Signed)
Recent ultrasound negative for DVT.  She may be experiencing some hyperalgesia related to weaning from her Cymbalta.  Unable to tolerate gabapentin and/or Lyrica in the past due to swelling.  We discussed trial of Elavil to see if this is effective for her pain.  Checking labs today as well.

## 2023-02-15 NOTE — ED Provider Notes (Signed)
[facility]    CSN: [id number] Arrival date & time: 02/15/23  0834      History   Chief Complaint Chief Complaint  Patient presents with   Leg Pain    Left    HPI Katrina Fernandez is a 57 y.o. female.   HPI   pleasant 57 year old female presents with left calf pain x 5 days reports no history.  Reports history of blood clot to the left calf 7 years ago.  Denies redness.  Reports slightly larger than right lower leg and TTP over calf muscle. PMH significant for left lower leg DVT, fibromyalgia, and peripheral edema (takes Lasix daily as needed).  Past Medical History:  Diagnosis Date   Anemia    Anxiety    Arthritis    "back and knees" (07/23/2016)   Chiari malformation type I (HCC) dx'd 2014   Chronic back pain    buldging, DDD; "neck and lower back" (07/23/2016)   Chronic constipation    takes Miralax daily   Chronic pain syndrome    takes Methadone and Keppra daily   Depression    takes Effexor daily   Endometriosis    1995   Fibromyalgia    GERD (gastroesophageal reflux disease)    not taking anything at the present time   Headache    History of DVT of lower extremity 07/2012   LEFT LEG --  3/ 2014.Was on a blood transfusion for 6 months   History of kidney stones    History of MRSA infection 10 yrs ago   Hyperlipidemia    takes Simvastatin daily   Migraine    "daily recently" (07/23/2016)   Muscle spasm    takes Zanaflex nightly   Peripheral edema    takes Lasix daily as needed   Peripheral neuropathy    Pleurisy 2000   Pneumonia    hx of-7-8 yrs ago   PONV (postoperative nausea and vomiting)    Seasonal allergies    takes Claritin daily   Spinal headache    "since 2002; still get them; try to manage it w/lying flat"    Patient Active Problem List   Diagnosis Date Noted   Primary osteoarthritis of right ankle 02/04/2023   Anxiety 01/05/2023   Chronic dyspnea 09/04/2022   Chest pain on breathing 04/23/2022   Dizziness 02/05/2022    AKI (acute kidney injury) (HCC) 06/22/2021   Occipital neuralgia of right side 05/01/2021   Sinobronchitis 03/07/2021   Eczema 02/19/2021   Flank pain with history of urolithiasis 11/29/2020   Shingles 09/02/2020   URI (upper respiratory infection) 08/21/2020   Elevated blood pressure reading 02/13/2020   Abnormal urinalysis 02/13/2020   Nephrolithiasis 02/08/2020   Hematuria 11/26/2019   Other fatigue 11/26/2019   Bronchitis 09/12/2019   COVID-19 07/26/2019   HNP (herniated nucleus pulposus), lumbar 03/01/2019   Cubital tunnel syndrome on left 03/17/2018   Plantar fasciitis of left foot 03/17/2018   CSF leak 06/23/2017   Trochanteric bursitis, left hip 06/09/2017   Pseudotumor cerebri 03/31/2017   Obesity 09/15/2016   Abnormal weight gain 09/03/2016   Chiari I malformation (HCC) 07/09/2016   Prediabetes 02/21/2016   RLS (restless legs syndrome) 10/23/2015   Seasonal allergies 10/23/2015   Hyperlipidemia 07/17/2015   Osteoarthritis of carpometacarpal joint of left thumb 06/28/2015   Fibromyalgia 05/30/2015   Chronic back pain 05/09/2015   Therapeutic opioid-induced constipation (OIC) 01/31/2015   Insomnia 07/09/2014   Left tennis elbow 04/17/2014   Generalized anxiety disorder  04/03/2014   Spider angioma 10/11/2013   Chronic venous insufficiency 09/25/2013   Symptomatic mammary hypertrophy 05/30/2013   Nevus, non-neoplastic 08/01/2012   Pain syndrome, chronic 06/20/2012   Postlaminectomy syndrome of lumbar region 06/20/2012   Primary osteoarthritis of both knees 02/08/2012   MDD (major depressive disorder) 01/28/2012   Subluxation of peroneal tendon of right foot 01/28/2012   GERD (gastroesophageal reflux disease) 01/28/2012   Complication of gastric band procedure 08/26/2011    Past Surgical History:  Procedure Laterality Date   ANTERIOR CERVICAL DECOMP/DISCECTOMY FUSION  07/25/2009   C5 -- C6   APPENDECTOMY  age 31   APPLICATION OF CRANIAL NAVIGATION N/A  06/23/2017   Procedure: APPLICATION OF CRANIAL NAVIGATION;  Surgeon: Donalee Citrin, MD;  Location: [facility] OR;  Service: Neurosurgery;  Laterality: N/A;   BACK SURGERY     BRAIN SURGERY     VP shunt   BREAST REDUCTION SURGERY Bilateral 06/07/2013   Procedure: BILATERAL MAMMARY REDUCTION  (BREAST);  Surgeon: Wayland Denis, DO;  Location: [facility];  Service: Plastics;  Laterality: Bilateral;   BREAST REDUCTION SURGERY Bilateral 06/07/2013   Procedure:  LIPOSUCTION;  Surgeon: Wayland Denis, DO;  Location: [facility];  Service: Plastics;  Laterality: Bilateral;   CARPAL TUNNEL RELEASE Bilateral 2001   COLONOSCOPY  X 2   "polyps were benign"   CYSTOSCOPY WITH RETROGRADE PYELOGRAM, URETEROSCOPY AND STENT PLACEMENT Left 05/2021   (in my chart)   DILATION AND CURETTAGE OF UTERUS     ESOPHAGOGASTRODUODENOSCOPY     gastric banding undone  11/2014   LAPAROSCOPIC CHOLECYSTECTOMY  ~ 2000   LAPAROSCOPIC GASTRIC BANDING  2011   LAPAROSCOPIC REVISION VENTRICULAR-PERITONEAL (V-P) SHUNT N/A 06/23/2017   Procedure: Shunt Placment stereotactic ventricular catheter placement and general surgery abdominal exposure  (Laparoscopic vs. Open) Brain Lab;  Surgeon: Berna Bue, MD;  Location: [facility] OR;  Service: General;  Laterality: N/A;   LAPAROSCOPY N/A 06/23/2017   Procedure: LAPAROSCOPY DIAGNOSTIC;  Surgeon: Berna Bue, MD;  Location: [facility] OR;  Service: General;  Laterality: N/A;   LUMBAR LAMINECTOMY/DECOMPRESSION MICRODISCECTOMY N/A 07/23/2016   Procedure: Decompressive Lumbar Laminectomy Lumbar two-three  for Repair of  Cerberal Spinal Fluid  Leak;  Surgeon: Donalee Citrin, MD;  Location: [facility] OR;  Service: Neurosurgery;  Laterality: N/A;   LUMBAR LAMINECTOMY/DECOMPRESSION MICRODISCECTOMY Left 03/01/2019   Procedure: LEFT LUMBAR THREE-FOUR EXTRAFORAMINAL MICRODISCECTOMY;  Surgeon: Donalee Citrin, MD;  Location: [facility] OR;  Service: Neurosurgery;  Laterality: Left;   PLACEMENT LUMBOPERITONEAL  SHUNT FOR CEREBROSPINAL FLUID DIVERSION AWAY FROM C8 PERINEURAL CYST  09-05-2003  &  12-24-2003   08-31-2003  REMOVAL SHUNT   POSTERIOR CERVICAL LAMINECTOMY  03/01/2001   C7-8 and Exploration C8 perineural cyst   POSTERIOR LUMBAR LAMINECTOMY L4-5/  DISKECTOMY L5-S1  05/20/2005   RE-DO DECOMPRESSION LAMINECTOMY AND FUSION L4-5  &  L5-S1  12/20/2006   02-04-2007--  RE-EXPLORATION L4 to S1, I & D LUMBAR WOUND HEMATOMA   REDUCTION MAMMAPLASTY     SHUNT REMOVAL N/A 07/10/2016   Procedure: SHUNT REMOVAL;  Surgeon: Donalee Citrin, MD;  Location: [facility] OR;  Service: Neurosurgery;  Laterality: N/A;  SHUNT REMOVAL   SPINAL CORD STIMULATOR INSERTION N/A 04/26/2015   Procedure: LUMBAR SPINAL CORD STIMULATOR INSERTION;  Surgeon: Odette Fraction, MD;  Location: [facility];  Service: Neurosurgery;  Laterality: N/A;  LUMBAR SPINAL CORD STIMULATOR INSERTION   SPINAL CORD STIMULATOR REMOVAL N/A 06/28/2019   Procedure: Removal of dorsal column stimulator and generator -;  Surgeon:  Donalee Citrin, MD;  Location: [facility] OR;  Service: Neurosurgery;  Laterality: N/A;   TENNIS ELBOW RELEASE/NIRSCHEL PROCEDURE Left 12/13/2014   Procedure: LEFT ELBOW LATERAL EPICONDYLAR DEBRIDEMENT AND OR REPAIR -RECONSTRUCTION ;  Surgeon: Bradly Bienenstock, MD;  Location: [facility];  Service: Orthopedics;  Laterality: Left;   TOTAL ABDOMINAL HYSTERECTOMY  1994   w/  Bilateral Salpinoophorectomy   VENTRICULOPERITONEAL SHUNT N/A 06/23/2017   Procedure: VENTRICULAR-PERITONEAL SHUNT INSERTION WITH ABDOMINAL LAPARASCOPY AND BRAINLAB NAVIGATION;  Surgeon: Donalee Citrin, MD;  Location: [facility] OR;  Service: Neurosurgery;  Laterality: N/A;    OB History   No obstetric history on file.      Home Medications    Prior to Admission medications   Medication Sig Start Date End Date Taking? Authorizing Provider  escitalopram (LEXAPRO) 5 MG tablet Take 1 tablet (5 mg total) by mouth daily. 01/05/23  Yes Everrett Coombe, DO  HYDROcodone-acetaminophen  (NORCO/VICODIN) 5-325 MG tablet Take 1 tablet by mouth every 6 (six) hours as needed for moderate pain.   Yes [provider]  acetaminophen (TYLENOL) 650 MG CR tablet Take 1 tablet (650 mg total) by mouth every 8 (eight) hours as needed for pain. 02/04/23   Monica Becton, MD  methocarbamol (ROBAXIN) 500 MG tablet Take 500 mg by mouth every 6 (six) hours as needed for muscle spasms.    [provider]  metoprolol tartrate (LOPRESSOR) 100 MG tablet Take 2 hours prior to CT scan 10/06/22   Lewayne Bunting, MD  montelukast (SINGULAIR) 10 MG tablet TAKE 1 TABLET BY MOUTH EVERYDAY AT BEDTIME 09/22/22   Everrett Coombe, DO  NARCAN 4 MG/0.1ML LIQD nasal spray kit Place 0.4 mg into the nose once.  11/22/18   [provider]  polyethylene glycol (MIRALAX / GLYCOLAX) 17 g packet Take 17 g by mouth daily as needed for mild constipation or moderate constipation.    [provider]  pramipexole (MIRAPEX) 0.5 MG tablet TAKE 1 TABLET BY MOUTH EVERYDAY AT BEDTIME 12/22/22   Everrett Coombe, DO  simvastatin (ZOCOR) 20 MG tablet TAKE 1 TABLET BY MOUTH EVERY DAY 09/21/22   Everrett Coombe, DO  tirzepatide [facility]) 12.5 MG/0.5ML Pen INJECT 12.5 MG SUBCUTANEOUSLY ONE TIME PER WEEK 11/27/22   Everrett Coombe, DO  traMADol (ULTRAM) 50 MG tablet Take 50-100 mg by mouth every 8 (eight) hours as needed (Pain).     [provider]    Family History Family History  Problem Relation Age of Onset   COPD Mother    Diabetes Mother    Asthma Mother    Diabetes Father    Colon cancer Father    Ovarian cancer Maternal Grandmother    Pancreatic cancer Maternal Grandfather    Depression Other     Social History Social History   Tobacco Use   Smoking status: Never   Smokeless tobacco: Never  Vaping Use   Vaping status: Never Used  Substance Use Topics   Alcohol use: Yes    Comment: once a year   Drug use: No     Allergies   Penicillins, Tape, Bactrim  [sulfamethoxazole-trimethoprim], Paxlovid [nirmatrelvir-ritonavir], and Chlorhexidine   Review of Systems Review of Systems  Musculoskeletal:        Calf pain  All other systems reviewed and are negative.    Physical Exam Triage Vital Signs ED Triage Vitals  Encounter Vitals Group     BP      Systolic BP Percentile      Diastolic  BP Percentile      Pulse      Resp      Temp      Temp src      SpO2      Weight      Height      Head Circumference      Peak Flow      Pain Score      Pain Loc      Pain Education      Exclude from Growth Chart    No data found.  Updated Vital Signs BP 137/89 (BP Location: Right Arm)   Pulse 89   Temp 98.5 F (36.9 C) (Oral)   Resp 16   Ht 5\' 3"  (1.6 m)   Wt 149 lb (67.6 kg)   BMI 26.39 kg/m     Physical Exam Vitals and nursing note reviewed.  Constitutional:      Appearance: Normal appearance. She is normal weight.  HENT:     Head: Normocephalic and atraumatic.     Mouth/Throat:     Mouth: Mucous membranes are moist.     Pharynx: Oropharynx is clear.  Eyes:     Extraocular Movements: Extraocular movements intact.     Conjunctiva/sclera: Conjunctivae normal.     Pupils: Pupils are equal, round, and reactive to light.  Cardiovascular:     Rate and Rhythm: Normal rate and regular rhythm.     Pulses: Normal pulses.     Heart sounds: Normal heart sounds.  Pulmonary:     Effort: Pulmonary effort is normal.     Breath sounds: Normal breath sounds. No wheezing, rhonchi or rales.  Musculoskeletal:        General: Normal range of motion.     Cervical back: Normal range of motion and neck supple.     Comments: Left lower leg superior medial aspect of ankle) TTP with mild soft tissue swelling noted, no erythema, nonfluctuant, nonindurated, no lymphatic streaking noted; right gastrocnemius-35.5 cm circumference, left gastrocnemius-38.0 cm circumference.  Skin:    General: Skin is warm and dry.  Neurological:     General: No  focal deficit present.     Mental Status: She is alert and oriented to person, place, and time. Mental status is at baseline.  Psychiatric:        Mood and Affect: Mood normal.        Behavior: Behavior normal.      UC Treatments / Results  Labs (all labs ordered are listed, but only abnormal results are displayed) Labs Reviewed - No data to display  EKG   Radiology US Venous Img Lower Unilateral Left  Result Date: 02/15/2023 CLINICAL DATA:  Left lower extremity pain and edema for the past 5 days. Evaluate for DVT. EXAM: LEFT LOWER EXTREMITY VENOUS DOPPLER ULTRASOUND TECHNIQUE: Gray-scale sonography with graded compression, as well as color Doppler and duplex ultrasound were performed to evaluate the lower extremity deep venous systems from the level of the common femoral vein and including the common femoral, femoral, profunda femoral, popliteal and calf veins including the posterior tibial, peroneal and gastrocnemius veins when visible. The superficial great saphenous vein was also interrogated. Spectral Doppler was utilized to evaluate flow at rest and with distal augmentation maneuvers in the common femoral, femoral and popliteal veins. COMPARISON:  None Available. FINDINGS: Contralateral Common Femoral Vein: Respiratory phasicity is normal and symmetric with the symptomatic side. No evidence of thrombus. Normal compressibility. Common Femoral Vein: No evidence of thrombus. Normal compressibility,  respiratory phasicity and response to augmentation. Saphenofemoral Junction: No evidence of thrombus. Normal compressibility and flow on color Doppler imaging. Profunda Femoral Vein: No evidence of thrombus. Normal compressibility and flow on color Doppler imaging. Femoral Vein: No evidence of thrombus. Normal compressibility, respiratory phasicity and response to augmentation. Popliteal Vein: No evidence of thrombus. Normal compressibility, respiratory phasicity and response to augmentation. Calf  Veins: No evidence of thrombus. Normal compressibility and flow on color Doppler imaging. Superficial Great Saphenous Vein: No evidence of thrombus. Normal compressibility. Other Findings: There is a 1.9 x 1.8 x 1.6 cm fluid collection about the posteromedial aspect of the ankle (images 37 through 41). IMPRESSION: 1. No evidence of DVT within the left lower extremity. 2. Indeterminate 1.9 cm fluid collection about the posteromedial aspect of the left ankle, nonspecific though potentially representative of a ganglion cyst Electronically Signed   By: Simonne Come M.D.   On: 02/15/2023 10:21    Procedures Procedures (including critical care time)  Medications Ordered in UC Medications - No data to display  Initial Impression / Assessment and Plan / UC Course  I have reviewed the triage vital signs and the nursing notes.  Pertinent labs & imaging results that were available during my care of the patient were reviewed by me and considered in my medical decision making (see chart for details).     MDM: 1.  Left leg pain-DVT study revealed above, patient advised with hardcopy provided to patient. 2.  Localized swelling of left lower leg-indeterminate 1.9 cm fluid collection about posterior medial aspect of leg ankle, nonspecific though potentially representative of a ganglion cyst per radiology (above) [place] orthopedic provider recommended for further evaluation to patient with contact information provided with his AVS. Advised patient of ultrasound results with hardcopy provided to patient.  Advised fluid collection probable ganglion cyst and to follow-up with orthopedics for further evaluation.  Contact information provided with this AVS today.  Advised if symptoms worsen and/or unresolved please follow-up with PCP or here for further evaluation.  Patient discharged home, hemodynamically stable. Final Clinical Impressions(s) / UC Diagnoses   Final diagnoses:  Left leg pain  Localized swelling of  left lower leg     Discharge Instructions      Advised patient of ultrasound results with hardcopy provided to patient.  Advised fluid collection probable ganglion cyst and to follow-up with orthopedics for further evaluation.  Contact information provided with this AVS today.  Advised if symptoms worsen and/or unresolved please follow-up with PCP or here for further evaluation.     ED Prescriptions   None    PDMP not reviewed this encounter.   Trevor Iha, FNP 02/15/23 1040

## 2023-02-15 NOTE — Patient Instructions (Signed)
Take 1/2 tab po daily x7 days then increase to full tab.  Take at bedtime.

## 2023-02-15 NOTE — ED Triage Notes (Signed)
Patient c/o left calf pain x 5 days. No injury. History of blood clot to left calf 7 years ago. No redness noted, slightly larger than the right leg.

## 2023-02-15 NOTE — Progress Notes (Signed)
Katrina SQUIER - 57 y.o. female MRN [id number]  Date of birth: 01/06/1966  Subjective Chief Complaint  Patient presents with   Leg Pain   Immunizations    HPI HEVEN DANSEREAU is a 57 year old female here today with complaint of left leg pain.  She has pain in her left popliteal space following the back of her calf.  She does have this for a few weeks.  Seen by Dr. Benjamin Stain for knee pain on the 19th.  She had imaging of the knee which showed some osteoarthritic changes.  Her pain has continued to get worse recently.  Seen in urgent care this morning and had ultrasound of the left lower extremity.  This was negative for DVT.  They did note an area on the left ankle consistent with ganglion cyst.  She was recently weaned from Cymbalta.  She did not start Lexapro.  She was weaned from Cymbalta due to hyperhidrosis.  She does have tramadol for chronic pain management.  She had swelling of her extremities with gabapentin and Lyrica.  She does not recall trying amitriptyline.  ROS:  A comprehensive ROS was completed and negative except as noted per HPI  Allergies  Allergen Reactions   Penicillins Anaphylaxis    11/28/20 - patient tolerated Keflex w/o adverse effects    Tape Rash    Plastic tape, silicone tape, Steri Strips   Bactrim [Sulfamethoxazole-Trimethoprim] Nausea Only   Paxlovid [Nirmatrelvir-Ritonavir] Nausea And Vomiting   Chlorhexidine Rash and Other (See Comments)    Dermatitis - Allergic    Past Medical History:  Diagnosis Date   Anemia    Anxiety    Arthritis    "back and knees" (07/23/2016)   Chiari malformation type I (HCC) dx'd 2014   Chronic back pain    buldging, DDD; "neck and lower back" (07/23/2016)   Chronic constipation    takes Miralax daily   Chronic pain syndrome    takes Methadone and Keppra daily   Depression    takes Effexor daily   Endometriosis    1995   Fibromyalgia    GERD (gastroesophageal reflux disease)    not taking anything at the  present time   Headache    History of DVT of lower extremity 07/2012   LEFT LEG --  3/ 2014.Was on a blood transfusion for 6 months   History of kidney stones    History of MRSA infection 10 yrs ago   Hyperlipidemia    takes Simvastatin daily   Migraine    "daily recently" (07/23/2016)   Muscle spasm    takes Zanaflex nightly   Peripheral edema    takes Lasix daily as needed   Peripheral neuropathy    Pleurisy 2000   Pneumonia    hx of-7-8 yrs ago   PONV (postoperative nausea and vomiting)    Seasonal allergies    takes Claritin daily   Spinal headache    "since 2002; still get them; try to manage it w/lying flat"    Past Surgical History:  Procedure Laterality Date   ANTERIOR CERVICAL DECOMP/DISCECTOMY FUSION  07/25/2009   C5 -- C6   APPENDECTOMY  age 55   APPLICATION OF CRANIAL NAVIGATION N/A 06/23/2017   Procedure: APPLICATION OF CRANIAL NAVIGATION;  Surgeon: Donalee Citrin, MD;  Location: [facility] OR;  Service: Neurosurgery;  Laterality: N/A;   BACK SURGERY     BRAIN SURGERY     VP shunt   BREAST REDUCTION SURGERY Bilateral 06/07/2013   Procedure: BILATERAL  MAMMARY REDUCTION  (BREAST);  Surgeon: Wayland Denis, DO;  Location: [facility];  Service: Plastics;  Laterality: Bilateral;   BREAST REDUCTION SURGERY Bilateral 06/07/2013   Procedure:  LIPOSUCTION;  Surgeon: Wayland Denis, DO;  Location: [facility];  Service: Plastics;  Laterality: Bilateral;   CARPAL TUNNEL RELEASE Bilateral 2001   COLONOSCOPY  X 2   "polyps were benign"   CYSTOSCOPY WITH RETROGRADE PYELOGRAM, URETEROSCOPY AND STENT PLACEMENT Left 05/2021   (in my chart)   DILATION AND CURETTAGE OF UTERUS     ESOPHAGOGASTRODUODENOSCOPY     gastric banding undone  11/2014   LAPAROSCOPIC CHOLECYSTECTOMY  ~ 2000   LAPAROSCOPIC GASTRIC BANDING  2011   LAPAROSCOPIC REVISION VENTRICULAR-PERITONEAL (V-P) SHUNT N/A 06/23/2017   Procedure: Shunt Placment stereotactic ventricular catheter placement  and general surgery abdominal exposure  (Laparoscopic vs. Open) Brain Lab;  Surgeon: Berna Bue, MD;  Location: [facility] OR;  Service: General;  Laterality: N/A;   LAPAROSCOPY N/A 06/23/2017   Procedure: LAPAROSCOPY DIAGNOSTIC;  Surgeon: Berna Bue, MD;  Location: [facility] OR;  Service: General;  Laterality: N/A;   LUMBAR LAMINECTOMY/DECOMPRESSION MICRODISCECTOMY N/A 07/23/2016   Procedure: Decompressive Lumbar Laminectomy Lumbar two-three  for Repair of  Cerberal Spinal Fluid  Leak;  Surgeon: Donalee Citrin, MD;  Location: [facility] OR;  Service: Neurosurgery;  Laterality: N/A;   LUMBAR LAMINECTOMY/DECOMPRESSION MICRODISCECTOMY Left 03/01/2019   Procedure: LEFT LUMBAR THREE-FOUR EXTRAFORAMINAL MICRODISCECTOMY;  Surgeon: Donalee Citrin, MD;  Location: [facility] OR;  Service: Neurosurgery;  Laterality: Left;   PLACEMENT LUMBOPERITONEAL SHUNT FOR CEREBROSPINAL FLUID DIVERSION AWAY FROM C8 PERINEURAL CYST  09-05-2003  &  12-24-2003   08-31-2003  REMOVAL SHUNT   POSTERIOR CERVICAL LAMINECTOMY  03/01/2001   C7-8 and Exploration C8 perineural cyst   POSTERIOR LUMBAR LAMINECTOMY L4-5/  DISKECTOMY L5-S1  05/20/2005   RE-DO DECOMPRESSION LAMINECTOMY AND FUSION L4-5  &  L5-S1  12/20/2006   02-04-2007--  RE-EXPLORATION L4 to S1, I & D LUMBAR WOUND HEMATOMA   REDUCTION MAMMAPLASTY     SHUNT REMOVAL N/A 07/10/2016   Procedure: SHUNT REMOVAL;  Surgeon: Donalee Citrin, MD;  Location: [facility] OR;  Service: Neurosurgery;  Laterality: N/A;  SHUNT REMOVAL   SPINAL CORD STIMULATOR INSERTION N/A 04/26/2015   Procedure: LUMBAR SPINAL CORD STIMULATOR INSERTION;  Surgeon: Odette Fraction, MD;  Location: [facility];  Service: Neurosurgery;  Laterality: N/A;  LUMBAR SPINAL CORD STIMULATOR INSERTION   SPINAL CORD STIMULATOR REMOVAL N/A 06/28/2019   Procedure: Removal of dorsal column stimulator and generator -;  Surgeon: Donalee Citrin, MD;  Location: [facility] OR;  Service: Neurosurgery;  Laterality: N/A;   TENNIS ELBOW RELEASE/NIRSCHEL PROCEDURE Left 12/13/2014    Procedure: LEFT ELBOW LATERAL EPICONDYLAR DEBRIDEMENT AND OR REPAIR -RECONSTRUCTION ;  Surgeon: Bradly Bienenstock, MD;  Location: [facility];  Service: Orthopedics;  Laterality: Left;   TOTAL ABDOMINAL HYSTERECTOMY  1994   w/  Bilateral Salpinoophorectomy   VENTRICULOPERITONEAL SHUNT N/A 06/23/2017   Procedure: VENTRICULAR-PERITONEAL SHUNT INSERTION WITH ABDOMINAL LAPARASCOPY AND BRAINLAB NAVIGATION;  Surgeon: Donalee Citrin, MD;  Location: [facility] OR;  Service: Neurosurgery;  Laterality: N/A;    Social History   Socioeconomic History   Marital status: Divorced    Spouse name: Not on file   Number of children: 2   Years of education: 14   Highest education level: Associate degree: academic program  Occupational History   Occupation: Disabled   Tobacco Use   Smoking status: Never   Smokeless tobacco: Never  Vaping  Use   Vaping status: Never Used  Substance and Sexual Activity   Alcohol use: Yes    Comment: once a year   Drug use: No   Sexual activity: Not Currently    Birth control/protection: Surgical  Other Topics Concern   Not on file  Social History Narrative   Mrs Joshi is divorced, on permanent disability and lives at home with her mother and her daughter. She is independent in her care needs. She denies concern with transportation to medical appointments She is on a fixed income. She enjoys crafting and quilting.   Social Determinants of Health   Financial Resource Strain: Low Risk  (09/04/2022)   Overall Financial Resource Strain (CARDIA)    Difficulty of Paying Living Expenses: Not very hard  Food Insecurity: No Food Insecurity (09/04/2022)   Hunger Vital Sign    Worried About Running Out of Food in the Last Year: Never true    Ran Out of Food in the Last Year: Never true  Transportation Needs: No Transportation Needs (09/04/2022)   PRAPARE - Administrator, Civil Service (Medical): No    Lack of Transportation (Non-Medical): No  Physical  Activity: Inactive (09/04/2022)   Exercise Vital Sign    Days of Exercise per Week: 0 days    Minutes of Exercise per Session: 0 min  Stress: No Stress Concern Present (09/04/2022)   [employer] of Occupational Health - Occupational Stress Questionnaire    Feeling of Stress : Only a little  Recent Concern: Stress - Stress Concern Present (08/03/2022)   [employer] of Occupational Health - Occupational Stress Questionnaire    Feeling of Stress : To some extent  Social Connections: Unknown (09/04/2022)   Social Connection and Isolation Panel [NHANES]    Frequency of Communication with Friends and Family: More than three times a week    Frequency of Social Gatherings with Friends and Family: Twice a week    Attends Religious Services: Patient declined    Database administrator or Organizations: Yes    Attends Banker Meetings: 1 to 4 times per year    Marital Status: Divorced  Recent Concern: Social Connections - Moderately Isolated (08/03/2022)   Social Connection and Isolation Panel [NHANES]    Frequency of Communication with Friends and Family: Three times a week    Frequency of Social Gatherings with Friends and Family: More than three times a week    Attends Religious Services: Never    Database administrator or Organizations: Yes    Attends Engineer, structural: More than 4 times per year    Marital Status: Divorced    Family History  Problem Relation Age of Onset   COPD Mother    Diabetes Mother    Asthma Mother    Diabetes Father    Colon cancer Father    Ovarian cancer Maternal Grandmother    Pancreatic cancer Maternal Grandfather    Depression Other     Health Maintenance  Topic Date Due   COVID-19 Vaccine (3 - Moderna risk series) 02/20/2023 (Originally 12/05/2019)   Zoster Vaccines- Shingrix (2 of 2) 05/17/2023 (Originally 04/02/2022)   Hepatitis C Screening  08/03/2023 (Originally 08/23/1985)   Colonoscopy  07/05/2023   Medicare Annual  Wellness (AWV)  08/03/2023   MAMMOGRAM  08/29/2023   DTaP/Tdap/Td (3 - Td or Tdap) 02/14/2033   INFLUENZA VACCINE  Completed   HIV Screening  Completed   HPV VACCINES  Aged Out     -----------------------------------------------------------------------------------------------------------------------------------------------------------------------------------------------------------------  Physical Exam BP 128/81 (BP Location: Left Arm, Patient Position: Sitting, Cuff Size: Normal)   Pulse 96   Ht 5\' 3"  (1.6 m)   Wt 152 lb (68.9 kg)   SpO2 98%   BMI 26.93 kg/m   Physical Exam Constitutional:      Appearance: Normal appearance.  Eyes:     General: No scleral icterus. Cardiovascular:     Rate and Rhythm: Normal rate and regular rhythm.  Pulmonary:     Effort: Pulmonary effort is normal.     Breath sounds: Normal breath sounds.  Musculoskeletal:     Comments: Tenderness along the left calf and popliteal space.  No appreciable joint swelling.  Pulses 2+.  Neurological:     Mental Status: She is alert.  Psychiatric:        Mood and Affect: Mood normal.        Behavior: Behavior normal.     ------------------------------------------------------------------------------------------------------------------------------------------------------------------------------------------------------------------- Assessment and Plan  Fibromyalgia Recent ultrasound negative for DVT.  She may be experiencing some hyperalgesia related to weaning from her Cymbalta.  Unable to tolerate gabapentin and/or Lyrica in the past due to swelling.  We discussed trial of Elavil to see if this is effective for her pain.  Checking labs today as well.   Meds ordered this encounter  Medications   amitriptyline (ELAVIL) 50 MG tablet    Sig: Take 1 tablet (50 mg total) by mouth at bedtime.    Dispense:  90 tablet    Refill:  0    No follow-ups on file.    This visit occurred during the SARS-CoV-2  public health emergency.  Safety protocols were in place, including screening questions prior to the visit, additional usage of staff PPE, and extensive cleaning of exam room while observing appropriate contact time as indicated for disinfecting solutions.

## 2023-02-16 LAB — TSH: TSH: 0.392 u[IU]/mL — ABNORMAL LOW (ref 0.450–4.500)

## 2023-02-16 LAB — C-REACTIVE PROTEIN: CRP: <1 mg/L (ref 0–10)

## 2023-02-16 LAB — SEDIMENTATION RATE: Sed Rate: 2 mm/h (ref 0–40)

## 2023-02-16 LAB — VITAMIN B12: Vitamin B-12: 205 pg/mL — ABNORMAL LOW (ref 232–1245)

## 2023-02-18 DIAGNOSIS — Z981 Arthrodesis status: Secondary | ICD-10-CM | POA: Diagnosis not present

## 2023-02-18 DIAGNOSIS — M542 Cervicalgia: Secondary | ICD-10-CM | POA: Diagnosis not present

## 2023-02-18 DIAGNOSIS — M5417 Radiculopathy, lumbosacral region: Secondary | ICD-10-CM | POA: Diagnosis not present

## 2023-02-18 DIAGNOSIS — M961 Postlaminectomy syndrome, not elsewhere classified: Secondary | ICD-10-CM | POA: Diagnosis not present

## 2023-02-18 DIAGNOSIS — M160 Bilateral primary osteoarthritis of hip: Secondary | ICD-10-CM | POA: Diagnosis not present

## 2023-02-19 ENCOUNTER — Encounter: Payer: Self-pay | Admitting: Family Medicine

## 2023-02-22 MED ORDER — CYANOCOBALAMIN 1000 MCG/ML IJ SOLN: 1000.0000 ug | INTRAMUSCULAR | 6 refills | Status: AC

## 2023-03-02 ENCOUNTER — Ambulatory Visit (INDEPENDENT_AMBULATORY_CARE_PROVIDER_SITE_OTHER): Payer: Medicare HMO | Admitting: Family Medicine

## 2023-03-02 ENCOUNTER — Encounter: Payer: Self-pay | Admitting: Family Medicine

## 2023-03-02 VITALS — BP 124/75 | HR 83 | Ht 63.0 in | Wt 154.0 lb

## 2023-03-02 DIAGNOSIS — Z6834 Body mass index (BMI) 34.0-34.9, adult: Secondary | ICD-10-CM | POA: Diagnosis not present

## 2023-03-02 DIAGNOSIS — F3341 Major depressive disorder, recurrent, in partial remission: Secondary | ICD-10-CM

## 2023-03-02 DIAGNOSIS — M797 Fibromyalgia: Secondary | ICD-10-CM | POA: Diagnosis not present

## 2023-03-02 DIAGNOSIS — E66811 Obesity, class 1: Secondary | ICD-10-CM | POA: Diagnosis not present

## 2023-03-02 DIAGNOSIS — E6609 Other obesity due to excess calories: Secondary | ICD-10-CM | POA: Diagnosis not present

## 2023-03-02 MED ORDER — TIRZEPATIDE 15 MG/0.5ML ~~LOC~~ SOAJ: 15.0000 mg | SUBCUTANEOUS | 1 refills | Status: DC

## 2023-03-02 NOTE — Assessment & Plan Note (Signed)
Weaned from duloxetine, however having more irritability.  She will add low strength lexapro.

## 2023-03-02 NOTE — Progress Notes (Signed)
Katrina Fernandez - 57 y.o. female MRN [id number]  Date of birth: 1966/01/01  Subjective Chief Complaint  Patient presents with   Medical Management of Chronic Issues    8 wk Mood fup    HPI Katrina Fernandez is a 57 y.o. female here today for follow up.   She has weaned from cymbalta due to side effects.  Noted more irritability after cutting back on this.  Lexapro 5mg  added.  She remains on elavil as well.  She has not started the lexapro yet, wanted to see how she did with the Elavil.  She reports that the elavil does make her sleepy but has helped with her pain quite a bit.  She still feels irritable at times.       03/02/2023    9:55 AM 03/02/2023    9:36 AM 01/05/2023    8:54 AM  Depression screen PHQ 2/9  Decreased Interest 0 0 1  Down, Depressed, Hopeless 1 1 0  PHQ - 2 Score 1 1 1   Altered sleeping 2  2  Tired, decreased energy 3  1  Change in appetite 1  0  Feeling bad or failure about yourself  0  0  Trouble concentrating 0  0  Moving slowly or fidgety/restless 0  0  Suicidal thoughts 0  0  PHQ-9 Score 7  4  Difficult doing work/chores Somewhat difficult  Not difficult at all      03/02/2023    9:55 AM 01/05/2023    8:55 AM 10/11/2018    8:28 AM 09/27/2018    8:26 AM  GAD 7 : Generalized Anxiety Score  Nervous, Anxious, on Edge 1 1 3 3   Control/stop worrying 0 0 2 3  Worry too much - different things 0 1 1 3   Trouble relaxing 0 0 2 2  Restless 0 0 0 0  Easily annoyed or irritable 2 2 0 2  Afraid - awful might happen 0 0 0 1  Total GAD 7 Score 3 4 8 14   Anxiety Difficulty Not difficult at all Not difficult at all Not difficult at all Not difficult at all    She would also like to increase mounjaro.  Feels that she has hit a plateau at current strength.   ROS:  A comprehensive ROS was completed and negative except as noted per HPI  Allergies  Allergen Reactions   Penicillins Anaphylaxis    11/28/20 - patient tolerated Keflex w/o adverse effects    Tape  Rash    Plastic tape, silicone tape, Steri Strips   Bactrim [Sulfamethoxazole-Trimethoprim] Nausea Only   Paxlovid [Nirmatrelvir-Ritonavir] Nausea And Vomiting   Chlorhexidine Rash and Other (See Comments)    Dermatitis - Allergic    Past Medical History:  Diagnosis Date   Anemia    Anxiety    Arthritis    "back and knees" (07/23/2016)   Chiari malformation type I (HCC) dx'd 2014   Chronic back pain    buldging, DDD; "neck and lower back" (07/23/2016)   Chronic constipation    takes Miralax daily   Chronic pain syndrome    takes Methadone and Keppra daily   Depression    takes Effexor daily   Endometriosis    1995   Fibromyalgia    GERD (gastroesophageal reflux disease)    not taking anything at the present time   Headache    History of DVT of lower extremity 07/2012   LEFT LEG --  3/ 2014.Was on a  blood transfusion for 6 months   History of kidney stones    History of MRSA infection 10 yrs ago   Hyperlipidemia    takes Simvastatin daily   Migraine    "daily recently" (07/23/2016)   Muscle spasm    takes Zanaflex nightly   Peripheral edema    takes Lasix daily as needed   Peripheral neuropathy    Pleurisy 2000   Pneumonia    hx of-7-8 yrs ago   PONV (postoperative nausea and vomiting)    Seasonal allergies    takes Claritin daily   Spinal headache    "since 2002; still get them; try to manage it w/lying flat"    Past Surgical History:  Procedure Laterality Date   ANTERIOR CERVICAL DECOMP/DISCECTOMY FUSION  07/25/2009   C5 -- C6   APPENDECTOMY  age 34   APPLICATION OF CRANIAL NAVIGATION N/A 06/23/2017   Procedure: APPLICATION OF CRANIAL NAVIGATION;  Surgeon: Donalee Citrin, MD;  Location: [facility] OR;  Service: Neurosurgery;  Laterality: N/A;   BACK SURGERY     BRAIN SURGERY     VP shunt   BREAST REDUCTION SURGERY Bilateral 06/07/2013   Procedure: BILATERAL MAMMARY REDUCTION  (BREAST);  Surgeon: Wayland Denis, DO;  Location: [facility];  Service: Plastics;   Laterality: Bilateral;   BREAST REDUCTION SURGERY Bilateral 06/07/2013   Procedure:  LIPOSUCTION;  Surgeon: Wayland Denis, DO;  Location: [facility];  Service: Plastics;  Laterality: Bilateral;   CARPAL TUNNEL RELEASE Bilateral 2001   COLONOSCOPY  X 2   "polyps were benign"   CYSTOSCOPY WITH RETROGRADE PYELOGRAM, URETEROSCOPY AND STENT PLACEMENT Left 05/2021   (in my chart)   DILATION AND CURETTAGE OF UTERUS     ESOPHAGOGASTRODUODENOSCOPY     gastric banding undone  11/2014   LAPAROSCOPIC CHOLECYSTECTOMY  ~ 2000   LAPAROSCOPIC GASTRIC BANDING  2011   LAPAROSCOPIC REVISION VENTRICULAR-PERITONEAL (V-P) SHUNT N/A 06/23/2017   Procedure: Shunt Placment stereotactic ventricular catheter placement and general surgery abdominal exposure  (Laparoscopic vs. Open) Brain Lab;  Surgeon: Berna Bue, MD;  Location: [facility] OR;  Service: General;  Laterality: N/A;   LAPAROSCOPY N/A 06/23/2017   Procedure: LAPAROSCOPY DIAGNOSTIC;  Surgeon: Berna Bue, MD;  Location: [facility] OR;  Service: General;  Laterality: N/A;   LUMBAR LAMINECTOMY/DECOMPRESSION MICRODISCECTOMY N/A 07/23/2016   Procedure: Decompressive Lumbar Laminectomy Lumbar two-three  for Repair of  Cerberal Spinal Fluid  Leak;  Surgeon: Donalee Citrin, MD;  Location: [facility] OR;  Service: Neurosurgery;  Laterality: N/A;   LUMBAR LAMINECTOMY/DECOMPRESSION MICRODISCECTOMY Left 03/01/2019   Procedure: LEFT LUMBAR THREE-FOUR EXTRAFORAMINAL MICRODISCECTOMY;  Surgeon: Donalee Citrin, MD;  Location: [facility] OR;  Service: Neurosurgery;  Laterality: Left;   PLACEMENT LUMBOPERITONEAL SHUNT FOR CEREBROSPINAL FLUID DIVERSION AWAY FROM C8 PERINEURAL CYST  09-05-2003  &  12-24-2003   08-31-2003  REMOVAL SHUNT   POSTERIOR CERVICAL LAMINECTOMY  03/01/2001   C7-8 and Exploration C8 perineural cyst   POSTERIOR LUMBAR LAMINECTOMY L4-5/  DISKECTOMY L5-S1  05/20/2005   RE-DO DECOMPRESSION LAMINECTOMY AND FUSION L4-5  &  L5-S1  12/20/2006   02-04-2007--   RE-EXPLORATION L4 to S1, I & D LUMBAR WOUND HEMATOMA   REDUCTION MAMMAPLASTY     SHUNT REMOVAL N/A 07/10/2016   Procedure: SHUNT REMOVAL;  Surgeon: Donalee Citrin, MD;  Location: [facility] OR;  Service: Neurosurgery;  Laterality: N/A;  SHUNT REMOVAL   SPINAL CORD STIMULATOR INSERTION N/A 04/26/2015   Procedure: LUMBAR SPINAL CORD STIMULATOR INSERTION;  Surgeon: Odette Fraction,  MD;  Location: [facility];  Service: Neurosurgery;  Laterality: N/A;  LUMBAR SPINAL CORD STIMULATOR INSERTION   SPINAL CORD STIMULATOR REMOVAL N/A 06/28/2019   Procedure: Removal of dorsal column stimulator and generator -;  Surgeon: Donalee Citrin, MD;  Location: [facility] OR;  Service: Neurosurgery;  Laterality: N/A;   TENNIS ELBOW RELEASE/NIRSCHEL PROCEDURE Left 12/13/2014   Procedure: LEFT ELBOW LATERAL EPICONDYLAR DEBRIDEMENT AND OR REPAIR -RECONSTRUCTION ;  Surgeon: Bradly Bienenstock, MD;  Location: [facility];  Service: Orthopedics;  Laterality: Left;   TOTAL ABDOMINAL HYSTERECTOMY  1994   w/  Bilateral Salpinoophorectomy   VENTRICULOPERITONEAL SHUNT N/A 06/23/2017   Procedure: VENTRICULAR-PERITONEAL SHUNT INSERTION WITH ABDOMINAL LAPARASCOPY AND BRAINLAB NAVIGATION;  Surgeon: Donalee Citrin, MD;  Location: [facility] OR;  Service: Neurosurgery;  Laterality: N/A;    Social History   Socioeconomic History   Marital status: Divorced    Spouse name: Not on file   Number of children: 2   Years of education: 14   Highest education level: Associate degree: academic program  Occupational History   Occupation: Disabled   Tobacco Use   Smoking status: Never   Smokeless tobacco: Never  Vaping Use   Vaping status: Never Used  Substance and Sexual Activity   Alcohol use: Yes    Comment: once a year   Drug use: No   Sexual activity: Not Currently    Birth control/protection: Surgical  Other Topics Concern   Not on file  Social History Narrative   Mrs Liner is divorced, on permanent disability and lives at home with her mother and  her daughter. She is independent in her care needs. She denies concern with transportation to medical appointments She is on a fixed income. She enjoys crafting and quilting.   Social Determinants of Health   Financial Resource Strain: Low Risk  (09/04/2022)   Overall Financial Resource Strain (CARDIA)    Difficulty of Paying Living Expenses: Not very hard  Food Insecurity: No Food Insecurity (09/04/2022)   Hunger Vital Sign    Worried About Running Out of Food in the Last Year: Never true    Ran Out of Food in the Last Year: Never true  Transportation Needs: No Transportation Needs (09/04/2022)   PRAPARE - Administrator, Civil Service (Medical): No    Lack of Transportation (Non-Medical): No  Physical Activity: Inactive (09/04/2022)   Exercise Vital Sign    Days of Exercise per Week: 0 days    Minutes of Exercise per Session: 0 min  Stress: No Stress Concern Present (09/04/2022)   [employer] of Occupational Health - Occupational Stress Questionnaire    Feeling of Stress : Only a little  Recent Concern: Stress - Stress Concern Present (08/03/2022)   [employer] of Occupational Health - Occupational Stress Questionnaire    Feeling of Stress : To some extent  Social Connections: Unknown (09/04/2022)   Social Connection and Isolation Panel [NHANES]    Frequency of Communication with Friends and Family: More than three times a week    Frequency of Social Gatherings with Friends and Family: Twice a week    Attends Religious Services: Patient declined    Database administrator or Organizations: Yes    Attends Banker Meetings: 1 to 4 times per year    Marital Status: Divorced  Recent Concern: Social Connections - Moderately Isolated (08/03/2022)   Social Connection and Isolation Panel [NHANES]    Frequency of Communication with Friends and Family: Three  times a week    Frequency of Social Gatherings with Friends and Family: More than three times a week     Attends Religious Services: Never    Database administrator or Organizations: Yes    Attends Engineer, structural: More than 4 times per year    Marital Status: Divorced    Family History  Problem Relation Age of Onset   COPD Mother    Diabetes Mother    Asthma Mother    Diabetes Father    Colon cancer Father    Ovarian cancer Maternal Grandmother    Pancreatic cancer Maternal Grandfather    Depression Other     Health Maintenance  Topic Date Due   COVID-19 Vaccine (3 - Moderna risk series) 12/05/2019   Zoster Vaccines- Shingrix (2 of 2) 05/17/2023 (Originally 04/02/2022)   Hepatitis C Screening  08/03/2023 (Originally 08/23/1985)   Colonoscopy  07/05/2023   Medicare Annual Wellness (AWV)  08/03/2023   MAMMOGRAM  08/29/2023   DTaP/Tdap/Td (3 - Td or Tdap) 02/14/2033   INFLUENZA VACCINE  Completed   HIV Screening  Completed   HPV VACCINES  Aged Out     ----------------------------------------------------------------------------------------------------------------------------------------------------------------------------------------------------------------- Physical Exam BP 124/75   Pulse 83   Ht 5\' 3"  (1.6 m)   Wt 154 lb (69.9 kg)   SpO2 99%   BMI 27.28 kg/m   Physical Exam Constitutional:      Appearance: Normal appearance.  Neurological:     Mental Status: She is alert.  Psychiatric:        Mood and Affect: Mood normal.        Behavior: Behavior normal.     ------------------------------------------------------------------------------------------------------------------------------------------------------------------------------------------------------------------- Assessment and Plan  Fibromyalgia   Unable to tolerate gabapentin and/or Lyrica in the past due to swelling.  Elavil is working pretty well for this at this time.  Will plan to continue.   MDD (major depressive disorder) Weaned from duloxetine, however having more irritability.  She  will add low strength lexapro.   Obesity Increasing tirzepatide to 15mg /week.    Meds ordered this encounter  Medications   tirzepatide (MOUNJARO) 15 MG/0.5ML Pen    Sig: Inject 15 mg into the skin once a week.    Dispense:  6 mL    Refill:  1    Return in about 3 months (around 06/02/2023) for Mood/BH.    This visit occurred during the SARS-CoV-2 public health emergency.  Safety protocols were in place, including screening questions prior to the visit, additional usage of staff PPE, and extensive cleaning of exam room while observing appropriate contact time as indicated for disinfecting solutions.

## 2023-03-02 NOTE — Assessment & Plan Note (Signed)
Unable to tolerate gabapentin and/or Lyrica in the past due to swelling.  Elavil is working pretty well for this at this time.  Will plan to continue.

## 2023-03-02 NOTE — Assessment & Plan Note (Signed)
Increasing tirzepatide to 15mg /week.

## 2023-03-04 ENCOUNTER — Ambulatory Visit: Payer: Medicare HMO | Admitting: Family Medicine

## 2023-03-18 ENCOUNTER — Other Ambulatory Visit: Payer: Self-pay | Admitting: Family Medicine

## 2023-03-19 ENCOUNTER — Other Ambulatory Visit: Payer: Self-pay | Admitting: Family Medicine

## 2023-03-19 DIAGNOSIS — E7849 Other hyperlipidemia: Secondary | ICD-10-CM

## 2023-04-03 ENCOUNTER — Other Ambulatory Visit: Payer: Self-pay | Admitting: Family Medicine

## 2023-04-07 ENCOUNTER — Other Ambulatory Visit: Payer: Self-pay | Admitting: Family Medicine

## 2023-04-30 DIAGNOSIS — M25559 Pain in unspecified hip: Secondary | ICD-10-CM | POA: Diagnosis not present

## 2023-04-30 DIAGNOSIS — M961 Postlaminectomy syndrome, not elsewhere classified: Secondary | ICD-10-CM | POA: Diagnosis not present

## 2023-04-30 DIAGNOSIS — M542 Cervicalgia: Secondary | ICD-10-CM | POA: Diagnosis not present

## 2023-04-30 DIAGNOSIS — Z981 Arthrodesis status: Secondary | ICD-10-CM | POA: Diagnosis not present

## 2023-04-30 DIAGNOSIS — Z79899 Other long term (current) drug therapy: Secondary | ICD-10-CM | POA: Diagnosis not present

## 2023-04-30 DIAGNOSIS — M5417 Radiculopathy, lumbosacral region: Secondary | ICD-10-CM | POA: Diagnosis not present

## 2023-05-11 ENCOUNTER — Other Ambulatory Visit: Payer: Self-pay | Admitting: Family Medicine

## 2023-05-13 ENCOUNTER — Other Ambulatory Visit: Payer: Self-pay | Admitting: Family Medicine

## 2023-06-02 ENCOUNTER — Ambulatory Visit: Payer: Medicare HMO | Admitting: Family Medicine

## 2023-06-11 ENCOUNTER — Other Ambulatory Visit: Payer: Self-pay

## 2023-06-11 MED ORDER — TIRZEPATIDE 15 MG/0.5ML ~~LOC~~ SOAJ: 15.0000 mg | SUBCUTANEOUS | 0 refills | Status: DC

## 2023-06-16 ENCOUNTER — Encounter: Payer: Self-pay | Admitting: Family Medicine

## 2023-06-16 ENCOUNTER — Ambulatory Visit (INDEPENDENT_AMBULATORY_CARE_PROVIDER_SITE_OTHER): Payer: Medicare HMO | Admitting: Family Medicine

## 2023-06-16 ENCOUNTER — Other Ambulatory Visit: Payer: Self-pay | Admitting: Family Medicine

## 2023-06-16 VITALS — BP 108/70 | HR 84 | Ht 63.0 in | Wt 156.0 lb

## 2023-06-16 DIAGNOSIS — M797 Fibromyalgia: Secondary | ICD-10-CM

## 2023-06-16 DIAGNOSIS — G2581 Restless legs syndrome: Secondary | ICD-10-CM | POA: Diagnosis not present

## 2023-06-16 DIAGNOSIS — F3341 Major depressive disorder, recurrent, in partial remission: Secondary | ICD-10-CM | POA: Diagnosis not present

## 2023-06-16 DIAGNOSIS — E6609 Other obesity due to excess calories: Secondary | ICD-10-CM

## 2023-06-16 DIAGNOSIS — Z6834 Body mass index (BMI) 34.0-34.9, adult: Secondary | ICD-10-CM | POA: Diagnosis not present

## 2023-06-16 DIAGNOSIS — E66811 Obesity, class 1: Secondary | ICD-10-CM | POA: Diagnosis not present

## 2023-06-16 MED ORDER — AMITRIPTYLINE HCL 50 MG PO TABS: 50.0000 mg | ORAL_TABLET | Freq: Every day | ORAL | 2 refills | Status: DC

## 2023-06-16 MED ORDER — TIRZEPATIDE 15 MG/0.5ML ~~LOC~~ SOAJ: 15.0000 mg | SUBCUTANEOUS | 5 refills | Status: DC

## 2023-06-16 MED ORDER — ESCITALOPRAM OXALATE 5 MG PO TABS: 5.0000 mg | ORAL_TABLET | Freq: Every day | ORAL | 2 refills | Status: DC

## 2023-06-16 MED ORDER — PRAMIPEXOLE DIHYDROCHLORIDE 0.5 MG PO TABS: ORAL_TABLET | ORAL | 2 refills | Status: DC

## 2023-06-16 NOTE — Assessment & Plan Note (Signed)
She has done well with tirzepatide at 15mg /week.  Encouraged to increase protein intake and incorporate some light strength training.

## 2023-06-16 NOTE — Assessment & Plan Note (Signed)
Unable to tolerate gabapentin and/or Lyrica in the past due to swelling.  Elavil is working pretty well for this at this time.  Will plan to continue.

## 2023-06-16 NOTE — Assessment & Plan Note (Signed)
Addition of low strength lexapro seems to be helpful.  She will continue at current strength.

## 2023-06-16 NOTE — Progress Notes (Signed)
Katrina Fernandez - 58 y.o. female MRN [id number]  Date of birth: [birth year]  Subjective Chief Complaint  Patient presents with   Medical Management of Chronic Issues    HPI Katrina Fernandez is a 58 y.o. female here today for follow up visit.   Following up for mood today.  She had weaned from duloxetine previously.  Elavil was added to help with FM pain and this does seem to help.  She complained of increased irritability at last visit and low strength lexapro was also added.   She is doing well with mounjaro at current strength.  Blood sugars and weight have remained stable.    ROS:  A comprehensive ROS was completed and negative except as noted per HPI   Allergies  Allergen Reactions   Penicillins Anaphylaxis    11/28/20 - patient tolerated Keflex w/o adverse effects    Tape Rash    Plastic tape, silicone tape, Steri Strips   Bactrim [Sulfamethoxazole-Trimethoprim] Nausea Only   Paxlovid [Nirmatrelvir-Ritonavir] Nausea And Vomiting   Chlorhexidine Rash and Other (See Comments)    Dermatitis - Allergic    Past Medical History:  Diagnosis Date   Anemia    Anxiety    Arthritis    "back and knees" (07/23/2016)   Chiari malformation type I (HCC) dx'd 2014   Chronic back pain    buldging, DDD; "neck and lower back" (07/23/2016)   Chronic constipation    takes Miralax daily   Chronic pain syndrome    takes Methadone and Keppra daily   Depression    takes Effexor daily   Endometriosis    1995   Fibromyalgia    GERD (gastroesophageal reflux disease)    not taking anything at the present time   Headache    History of DVT of lower extremity 07/2012   LEFT LEG --  3/ 2014.Was on a blood transfusion for 6 months   History of kidney stones    History of MRSA infection 10 yrs ago   Hyperlipidemia    takes Simvastatin daily   Migraine    "daily recently" (07/23/2016)   Muscle spasm    takes Zanaflex nightly   Peripheral edema    takes Lasix daily as needed   Peripheral  neuropathy    Pleurisy 2000   Pneumonia    hx of-7-8 yrs ago   PONV (postoperative nausea and vomiting)    Seasonal allergies    takes Claritin daily   Spinal headache    "since 2002; still get them; try to manage it w/lying flat"    Past Surgical History:  Procedure Laterality Date   ANTERIOR CERVICAL DECOMP/DISCECTOMY FUSION  07/25/2009   C5 -- C6   APPENDECTOMY  age 70   APPLICATION OF CRANIAL NAVIGATION N/A 06/23/2017   Procedure: APPLICATION OF CRANIAL NAVIGATION;  Surgeon: Donalee Citrin, MD;  Location: [facility] OR;  Service: Neurosurgery;  Laterality: N/A;   BACK SURGERY     BRAIN SURGERY     VP shunt   BREAST REDUCTION SURGERY Bilateral 06/07/2013   Procedure: BILATERAL MAMMARY REDUCTION  (BREAST);  Surgeon: Wayland Denis, DO;  Location: [facility];  Service: Plastics;  Laterality: Bilateral;   BREAST REDUCTION SURGERY Bilateral 06/07/2013   Procedure:  LIPOSUCTION;  Surgeon: Wayland Denis, DO;  Location: [facility];  Service: Plastics;  Laterality: Bilateral;   CARPAL TUNNEL RELEASE Bilateral 2001   COLONOSCOPY  X 2   "polyps were benign"   CYSTOSCOPY WITH RETROGRADE PYELOGRAM,  URETEROSCOPY AND STENT PLACEMENT Left 05/2021   (in my chart)   DILATION AND CURETTAGE OF UTERUS     ESOPHAGOGASTRODUODENOSCOPY     gastric banding undone  11/2014   LAPAROSCOPIC CHOLECYSTECTOMY  ~ 2000   LAPAROSCOPIC GASTRIC BANDING  2011   LAPAROSCOPIC REVISION VENTRICULAR-PERITONEAL (V-P) SHUNT N/A 06/23/2017   Procedure: Shunt Placment stereotactic ventricular catheter placement and general surgery abdominal exposure  (Laparoscopic vs. Open) Brain Lab;  Surgeon: Berna Bue, MD;  Location: [facility] OR;  Service: General;  Laterality: N/A;   LAPAROSCOPY N/A 06/23/2017   Procedure: LAPAROSCOPY DIAGNOSTIC;  Surgeon: Berna Bue, MD;  Location: [facility] OR;  Service: General;  Laterality: N/A;   LUMBAR LAMINECTOMY/DECOMPRESSION MICRODISCECTOMY N/A 07/23/2016   Procedure:  Decompressive Lumbar Laminectomy Lumbar two-three  for Repair of  Cerberal Spinal Fluid  Leak;  Surgeon: Donalee Citrin, MD;  Location: [facility] OR;  Service: Neurosurgery;  Laterality: N/A;   LUMBAR LAMINECTOMY/DECOMPRESSION MICRODISCECTOMY Left 03/01/2019   Procedure: LEFT LUMBAR THREE-FOUR EXTRAFORAMINAL MICRODISCECTOMY;  Surgeon: Donalee Citrin, MD;  Location: [facility] OR;  Service: Neurosurgery;  Laterality: Left;   PLACEMENT LUMBOPERITONEAL SHUNT FOR CEREBROSPINAL FLUID DIVERSION AWAY FROM C8 PERINEURAL CYST  09-05-2003  &  12-24-2003   08-31-2003  REMOVAL SHUNT   POSTERIOR CERVICAL LAMINECTOMY  03/01/2001   C7-8 and Exploration C8 perineural cyst   POSTERIOR LUMBAR LAMINECTOMY L4-5/  DISKECTOMY L5-S1  05/20/2005   RE-DO DECOMPRESSION LAMINECTOMY AND FUSION L4-5  &  L5-S1  12/20/2006   02-04-2007--  RE-EXPLORATION L4 to S1, I & D LUMBAR WOUND HEMATOMA   REDUCTION MAMMAPLASTY     SHUNT REMOVAL N/A 07/10/2016   Procedure: SHUNT REMOVAL;  Surgeon: Donalee Citrin, MD;  Location: [facility] OR;  Service: Neurosurgery;  Laterality: N/A;  SHUNT REMOVAL   SPINAL CORD STIMULATOR INSERTION N/A 04/26/2015   Procedure: LUMBAR SPINAL CORD STIMULATOR INSERTION;  Surgeon: Odette Fraction, MD;  Location: [facility];  Service: Neurosurgery;  Laterality: N/A;  LUMBAR SPINAL CORD STIMULATOR INSERTION   SPINAL CORD STIMULATOR REMOVAL N/A 06/28/2019   Procedure: Removal of dorsal column stimulator and generator -;  Surgeon: Donalee Citrin, MD;  Location: [facility] OR;  Service: Neurosurgery;  Laterality: N/A;   TENNIS ELBOW RELEASE/NIRSCHEL PROCEDURE Left 12/13/2014   Procedure: LEFT ELBOW LATERAL EPICONDYLAR DEBRIDEMENT AND OR REPAIR -RECONSTRUCTION ;  Surgeon: Bradly Bienenstock, MD;  Location: [facility];  Service: Orthopedics;  Laterality: Left;   TOTAL ABDOMINAL HYSTERECTOMY  1994   w/  Bilateral Salpinoophorectomy   VENTRICULOPERITONEAL SHUNT N/A 06/23/2017   Procedure: VENTRICULAR-PERITONEAL SHUNT INSERTION WITH ABDOMINAL LAPARASCOPY AND  BRAINLAB NAVIGATION;  Surgeon: Donalee Citrin, MD;  Location: [facility] OR;  Service: Neurosurgery;  Laterality: N/A;    Social History   Socioeconomic History   Marital status: Divorced    Spouse name: Not on file   Number of children: 2   Years of education: 14   Highest education level: Associate degree: academic program  Occupational History   Occupation: Disabled   Tobacco Use   Smoking status: Never   Smokeless tobacco: Never  Vaping Use   Vaping status: Never Used  Substance and Sexual Activity   Alcohol use: Yes    Comment: once a year   Drug use: No   Sexual activity: Not Currently    Birth control/protection: Surgical  Other Topics Concern   Not on file  Social History Narrative   Mrs Bamburg is divorced, on permanent disability and lives at home with her mother and her daughter. She  is independent in her care needs. She denies concern with transportation to medical appointments She is on a fixed income. She enjoys crafting and quilting.   Social Drivers of Health   Financial Resource Strain: Medium Risk (06/12/2023)   Overall Financial Resource Strain (CARDIA)    Difficulty of Paying Living Expenses: Somewhat hard  Food Insecurity: Food Insecurity Present (06/12/2023)   Hunger Vital Sign    Worried About Running Out of Food in the Last Year: Sometimes true    Ran Out of Food in the Last Year: Sometimes true  Transportation Needs: No Transportation Needs (06/12/2023)   PRAPARE - Administrator, Civil Service (Medical): No    Lack of Transportation (Non-Medical): No  Physical Activity: Inactive (06/12/2023)   Exercise Vital Sign    Days of Exercise per Week: 0 days    Minutes of Exercise per Session: 0 min  Stress: No Stress Concern Present (06/12/2023)   [employer] of Occupational Health - Occupational Stress Questionnaire    Feeling of Stress : Only a little  Social Connections: Moderately Integrated (06/12/2023)   Social Connection and Isolation Panel  [NHANES]    Frequency of Communication with Friends and Family: More than three times a week    Frequency of Social Gatherings with Friends and Family: Once a week    Attends Religious Services: 1 to 4 times per year    Active Member of Clubs or Organizations: Yes    Attends Engineer, structural: More than 4 times per year    Marital Status: Divorced    Family History  Problem Relation Age of Onset   COPD Mother    Diabetes Mother    Asthma Mother    Diabetes Father    Colon cancer Father    Ovarian cancer Maternal Grandmother    Pancreatic cancer Maternal Grandfather    Depression Other     Health Maintenance  Topic Date Due   Pneumococcal Vaccine 74-47 Years old (1 of 2 - PCV) Never done   COVID-19 Vaccine (3 - Moderna risk series) 12/05/2019   Zoster Vaccines- Shingrix (2 of 2) 04/02/2022   Colonoscopy  07/05/2023   Medicare Annual Wellness (AWV)  08/03/2023   Hepatitis C Screening  08/03/2023 (Originally 08/23/1985)   MAMMOGRAM  08/29/2023   DTaP/Tdap/Td (3 - Td or Tdap) 02/14/2033   INFLUENZA VACCINE  Completed   HIV Screening  Completed   HPV VACCINES  Aged Out     ----------------------------------------------------------------------------------------------------------------------------------------------------------------------------------------------------------------- Physical Exam BP 108/70 (BP Location: Left Arm, Patient Position: Sitting, Cuff Size: Normal)   Pulse 84   Ht 5\' 3"  (1.6 m)   Wt 156 lb (70.8 kg)   SpO2 95%   BMI 27.63 kg/m   Physical Exam Constitutional:      Appearance: Normal appearance.  HENT:     Head: Normocephalic and atraumatic.  Eyes:     General: No scleral icterus. Cardiovascular:     Rate and Rhythm: Normal rate and regular rhythm.  Pulmonary:     Effort: Pulmonary effort is normal.     Breath sounds: Normal breath sounds.  Musculoskeletal:     Cervical back: Neck supple.  Neurological:     Mental Status: She is  alert.  Psychiatric:        Mood and Affect: Mood normal.        Behavior: Behavior normal.     ------------------------------------------------------------------------------------------------------------------------------------------------------------------------------------------------------------------- Assessment and Plan  Obesity She has done well with tirzepatide at 15mg /week.  Encouraged  to increase protein intake and incorporate some light strength training.   MDD (major depressive disorder) Addition of low strength lexapro seems to be helpful.  She will continue at current strength.   Fibromyalgia   Unable to tolerate gabapentin and/or Lyrica in the past due to swelling.  Elavil is working pretty well for this at this time.  Will plan to continue.   RLS (restless legs syndrome) Stable with mirapex.  Continue at current strength.    Meds ordered this encounter  Medications   amitriptyline (ELAVIL) 50 MG tablet    Sig: Take 1 tablet (50 mg total) by mouth at bedtime.    Dispense:  90 tablet    Refill:  2   escitalopram (LEXAPRO) 5 MG tablet    Sig: Take 1 tablet (5 mg total) by mouth daily.    Dispense:  90 tablet    Refill:  2   pramipexole (MIRAPEX) 0.5 MG tablet    Sig: TAKE 1 TABLET BY MOUTH EVERYDAY AT BEDTIME    Dispense:  90 tablet    Refill:  2   tirzepatide (MOUNJARO) 15 MG/0.5ML Pen    Sig: Inject 15 mg into the skin once a week.    Dispense:  2 mL    Refill:  5    Return in about 6 months (around 12/14/2023) for Mood/BH/FM.    This visit occurred during the SARS-CoV-2 public health emergency.  Safety protocols were in place, including screening questions prior to the visit, additional usage of staff PPE, and extensive cleaning of exam room while observing appropriate contact time as indicated for disinfecting solutions.

## 2023-06-16 NOTE — Assessment & Plan Note (Signed)
Stable with mirapex.  Continue at current strength.

## 2023-06-24 ENCOUNTER — Telehealth: Payer: Self-pay

## 2023-06-24 NOTE — Telephone Encounter (Signed)
 Prior auth for: MOUNJARO  Determination: APPROVED Auth #: [id number]/A Valid from: 05/19/23 through 05/17/24 Patient notified via MyChart

## 2023-06-25 DIAGNOSIS — M961 Postlaminectomy syndrome, not elsewhere classified: Secondary | ICD-10-CM | POA: Diagnosis not present

## 2023-06-25 DIAGNOSIS — M542 Cervicalgia: Secondary | ICD-10-CM | POA: Diagnosis not present

## 2023-06-25 DIAGNOSIS — M25559 Pain in unspecified hip: Secondary | ICD-10-CM | POA: Diagnosis not present

## 2023-06-25 DIAGNOSIS — Z79899 Other long term (current) drug therapy: Secondary | ICD-10-CM | POA: Diagnosis not present

## 2023-06-25 DIAGNOSIS — M5417 Radiculopathy, lumbosacral region: Secondary | ICD-10-CM | POA: Diagnosis not present

## 2023-06-25 DIAGNOSIS — Z981 Arthrodesis status: Secondary | ICD-10-CM | POA: Diagnosis not present

## 2023-08-04 ENCOUNTER — Ambulatory Visit (INDEPENDENT_AMBULATORY_CARE_PROVIDER_SITE_OTHER): Payer: Medicare HMO

## 2023-08-04 VITALS — BP 108/75 | HR 90 | Ht 63.0 in | Wt 153.0 lb

## 2023-08-04 DIAGNOSIS — Z1211 Encounter for screening for malignant neoplasm of colon: Secondary | ICD-10-CM | POA: Diagnosis not present

## 2023-08-04 DIAGNOSIS — Z0000 Encounter for general adult medical examination without abnormal findings: Secondary | ICD-10-CM | POA: Diagnosis not present

## 2023-08-04 DIAGNOSIS — Z1231 Encounter for screening mammogram for malignant neoplasm of breast: Secondary | ICD-10-CM | POA: Diagnosis not present

## 2023-08-04 NOTE — Progress Notes (Signed)
 Subjective:   FLORENDA WATT is a 58 y.o. female who presents for Medicare Annual (Subsequent) preventive examination.  Visit Complete: In person  Patient Medicare AWV questionnaire was completed by the patient on n/a; I have confirmed that all information answered by patient is correct and no changes since this date.  Cardiac Risk Factors include: dyslipidemia;family history of premature cardiovascular disease     Objective:    Today's Vitals   08/04/23 0808 08/04/23 0812  BP: 108/75   Pulse: 90   SpO2: 99%   Weight: 153 lb (69.4 kg)   Height: 5\' 3"  (1.6 m)   PainSc:  6    Body mass index is 27.1 kg/m.     08/04/2023    8:25 AM 12/23/2022   10:16 AM 09/16/2022    8:51 AM 08/03/2022    8:15 AM 07/28/2021    8:10 AM 07/24/2020    9:10 AM 06/28/2019    9:03 AM  Advanced Directives  Does Patient Have a Medical Advance Directive? No No No No No No No  Would patient like information on creating a medical advance directive? No - Patient declined No - Patient declined No - Patient declined No - Patient declined No - Patient declined No - Patient declined No - Guardian declined    Current Medications (verified) Outpatient Encounter Medications as of 08/04/2023  Medication Sig   acetaminophen (TYLENOL) 650 MG CR tablet Take 1 tablet (650 mg total) by mouth every 8 (eight) hours as needed for pain.   cyanocobalamin (VITAMIN B12) 1000 MCG/ML injection Inject 1 mL (1,000 mcg total) into the muscle every 30 (thirty) days.   escitalopram (LEXAPRO) 5 MG tablet Take 1 tablet (5 mg total) by mouth daily.   HYDROcodone-acetaminophen (NORCO/VICODIN) 5-325 MG tablet Take 1 tablet by mouth every 6 (six) hours as needed for moderate pain.   methocarbamol (ROBAXIN) 500 MG tablet Take 500 mg by mouth every 6 (six) hours as needed for muscle spasms.   montelukast (SINGULAIR) 10 MG tablet TAKE 1 TABLET BY MOUTH EVERYDAY AT BEDTIME   NARCAN 4 MG/0.1ML LIQD nasal spray kit Place 0.4 mg into the nose  once.    polyethylene glycol (MIRALAX / GLYCOLAX) 17 g packet Take 17 g by mouth daily as needed for mild constipation or moderate constipation.   pramipexole (MIRAPEX) 0.5 MG tablet TAKE 1 TABLET BY MOUTH EVERYDAY AT BEDTIME   simvastatin (ZOCOR) 20 MG tablet TAKE 1 TABLET BY MOUTH EVERY DAY   tirzepatide (MOUNJARO) 15 MG/0.5ML Pen Inject 15 mg into the skin once a week.   traMADol (ULTRAM) 50 MG tablet Take 50-100 mg by mouth every 8 (eight) hours as needed (Pain).    [DISCONTINUED] amitriptyline (ELAVIL) 50 MG tablet Take 1 tablet (50 mg total) by mouth at bedtime.   No facility-administered encounter medications on file as of 08/04/2023.    Allergies (verified) Penicillins, Tape, Bactrim [sulfamethoxazole-trimethoprim], Paxlovid [nirmatrelvir-ritonavir], and Chlorhexidine   History: Past Medical History:  Diagnosis Date   Anemia    Anxiety    Arthritis    "back and knees" (07/23/2016)   Chiari malformation type I (HCC) dx'd 2014   Chronic back pain    buldging, DDD; "neck and lower back" (07/23/2016)   Chronic constipation    takes Miralax daily   Chronic pain syndrome    takes Methadone and Keppra daily   Depression    takes Effexor daily   Endometriosis    1995   Fibromyalgia  GERD (gastroesophageal reflux disease)    not taking anything at the present time   Headache    History of DVT of lower extremity 07/2012   LEFT LEG --  3/ 2014.Was on a blood transfusion for 6 months   History of kidney stones    History of MRSA infection 10 yrs ago   Hyperlipidemia    takes Simvastatin daily   Migraine    "daily recently" (07/23/2016)   Muscle spasm    takes Zanaflex nightly   Peripheral edema    takes Lasix daily as needed   Peripheral neuropathy    Pleurisy 2000   Pneumonia    hx of-7-8 yrs ago   PONV (postoperative nausea and vomiting)    Seasonal allergies    takes Claritin daily   Spinal headache    "since 2002; still get them; try to manage it w/lying flat"    Past Surgical History:  Procedure Laterality Date   ANTERIOR CERVICAL DECOMP/DISCECTOMY FUSION  07/25/2009   C5 -- C6   APPENDECTOMY  age 89   APPLICATION OF CRANIAL NAVIGATION N/A 06/23/2017   Procedure: APPLICATION OF CRANIAL NAVIGATION;  Surgeon: Donalee Citrin, MD;  Location: [facility] OR;  Service: Neurosurgery;  Laterality: N/A;   BACK SURGERY     BRAIN SURGERY     VP shunt   BREAST REDUCTION SURGERY Bilateral 06/07/2013   Procedure: BILATERAL MAMMARY REDUCTION  (BREAST);  Surgeon: Wayland Denis, DO;  Location: [facility];  Service: Plastics;  Laterality: Bilateral;   BREAST REDUCTION SURGERY Bilateral 06/07/2013   Procedure:  LIPOSUCTION;  Surgeon: Wayland Denis, DO;  Location: [facility];  Service: Plastics;  Laterality: Bilateral;   CARPAL TUNNEL RELEASE Bilateral 2001   COLONOSCOPY  X 2   "polyps were benign"   CYSTOSCOPY WITH RETROGRADE PYELOGRAM, URETEROSCOPY AND STENT PLACEMENT Left 05/2021   (in my chart)   DILATION AND CURETTAGE OF UTERUS     ESOPHAGOGASTRODUODENOSCOPY     gastric banding undone  11/2014   LAPAROSCOPIC CHOLECYSTECTOMY  ~ 2000   LAPAROSCOPIC GASTRIC BANDING  2011   LAPAROSCOPIC REVISION VENTRICULAR-PERITONEAL (V-P) SHUNT N/A 06/23/2017   Procedure: Shunt Placment stereotactic ventricular catheter placement and general surgery abdominal exposure  (Laparoscopic vs. Open) Brain Lab;  Surgeon: Berna Bue, MD;  Location: [facility] OR;  Service: General;  Laterality: N/A;   LAPAROSCOPY N/A 06/23/2017   Procedure: LAPAROSCOPY DIAGNOSTIC;  Surgeon: Berna Bue, MD;  Location: [facility] OR;  Service: General;  Laterality: N/A;   LUMBAR LAMINECTOMY/DECOMPRESSION MICRODISCECTOMY N/A 07/23/2016   Procedure: Decompressive Lumbar Laminectomy Lumbar two-three  for Repair of  Cerberal Spinal Fluid  Leak;  Surgeon: Donalee Citrin, MD;  Location: [facility];  Service: Neurosurgery;  Laterality: N/A;   LUMBAR LAMINECTOMY/DECOMPRESSION MICRODISCECTOMY Left  03/01/2019   Procedure: LEFT LUMBAR THREE-FOUR EXTRAFORAMINAL MICRODISCECTOMY;  Surgeon: Donalee Citrin, MD;  Location[facility] OR;  Service: Neurosurgery;  Laterality: Left;   PLACEMENT LUMBOPERITONEAL SHUNT FOR CEREBROSPINAL FLUID DIVERSION AWAY FROM C8 PERINEURAL CYST  09-05-2003  &  12-24-2003   08-31-2003  REMOVAL SHUNT   POSTERIOR CERVICAL LAMINECTOMY  03/01/2001   C7-8 and Exploration C8 perineural cyst   POSTERIOR LUMBAR LAMINECTOMY L4-5/  DISKECTOMY L5-S1  05/20/2005   RE-DO DECOMPRESSION LAMINECTOMY AND FUSION L4-5  &  L5-S1  12/20/2006   02-04-2007--  RE-EXPLORATION L4 to S1, I & D LUMBAR WOUND HEMATOMA   REDUCTION MAMMAPLASTY     SHUNT REMOVAL N/A 07/10/2016   Procedure: SHUNT REMOVAL;  Surgeon:  Donalee Citrin, MD;  Location: [facility] OR;  Service: Neurosurgery;  Laterality: N/A;  SHUNT REMOVAL   SPINAL CORD STIMULATOR INSERTION N/A 04/26/2015   Procedure: LUMBAR SPINAL CORD STIMULATOR INSERTION;  Surgeon: Odette Fraction, MD;  Location: [facility];  Service: Neurosurgery;  Laterality: N/A;  LUMBAR SPINAL CORD STIMULATOR INSERTION   SPINAL CORD STIMULATOR REMOVAL N/A 06/28/2019   Procedure: Removal of dorsal column stimulator and generator -;  Surgeon: Donalee Citrin, MD;  Location: [facility] OR;  Service: Neurosurgery;  Laterality: N/A;   TENNIS ELBOW RELEASE/NIRSCHEL PROCEDURE Left 12/13/2014   Procedure: LEFT ELBOW LATERAL EPICONDYLAR DEBRIDEMENT AND OR REPAIR -RECONSTRUCTION ;  Surgeon: Bradly Bienenstock, MD;  Location: [facility];  Service: Orthopedics;  Laterality: Left;   TOTAL ABDOMINAL HYSTERECTOMY  1994   w/  Bilateral Salpinoophorectomy   VENTRICULOPERITONEAL SHUNT N/A 06/23/2017   Procedure: VENTRICULAR-PERITONEAL SHUNT INSERTION WITH ABDOMINAL LAPARASCOPY AND BRAINLAB NAVIGATION;  Surgeon: Donalee Citrin, MD;  Location: [facility] OR;  Service: Neurosurgery;  Laterality: N/A;   Family History  Problem Relation Age of Onset   COPD Mother    Diabetes Mother    Asthma Mother    Diabetes Father     Colon cancer Father    Ovarian cancer Maternal Grandmother    Pancreatic cancer Maternal Grandfather    Depression Other    Social History   Socioeconomic History   Marital status: Divorced    Spouse name: Not on file   Number of children: 2   Years of education: 14   Highest education level: Associate degree: academic program  Occupational History   Occupation: Disabled   Tobacco Use   Smoking status: Never   Smokeless tobacco: Never  Vaping Use   Vaping status: Never Used  Substance and Sexual Activity   Alcohol use: Yes    Comment: once a year   Drug use: No   Sexual activity: Not Currently    Birth control/protection: Surgical  Other Topics Concern   Not on file  Social History Narrative   Mrs Harshman is divorced, on permanent disability and lives at home with her mother and her daughter. She is independent in her care needs. She denies concern with transportation to medical appointments She is on a fixed income. She enjoys crafting and quilting.   Social Drivers of Corporate investment banker Strain: Low Risk  (08/04/2023)   Overall Financial Resource Strain (CARDIA)    Difficulty of Paying Living Expenses: Not hard at all  Recent Concern: Financial Resource Strain - Medium Risk (06/12/2023)   Overall Financial Resource Strain (CARDIA)    Difficulty of Paying Living Expenses: Somewhat hard  Food Insecurity: No Food Insecurity (08/04/2023)   Hunger Vital Sign    Worried About Running Out of Food in the Last Year: Never true    Ran Out of Food in the Last Year: Never true  Recent Concern: Food Insecurity - Food Insecurity Present (06/12/2023)   Hunger Vital Sign    Worried About Running Out of Food in the Last Year: Sometimes true    Ran Out of Food in the Last Year: Sometimes true  Transportation Needs: No Transportation Needs (08/04/2023)   PRAPARE - Administrator, Civil Service (Medical): No    Lack of Transportation (Non-Medical): No  Physical  Activity: Sufficiently Active (08/04/2023)   Exercise Vital Sign    Days of Exercise per Week: 3 days    Minutes of Exercise per Session: 90 min  Recent Concern: Physical  Activity - Inactive (06/12/2023)   Exercise Vital Sign    Days of Exercise per Week: 0 days    Minutes of Exercise per Session: 0 min  Stress: No Stress Concern Present (08/04/2023)   [employer] of Occupational Health - Occupational Stress Questionnaire    Feeling of Stress : Not at all  Social Connections: Moderately Isolated (08/04/2023)   Social Connection and Isolation Panel [NHANES]    Frequency of Communication with Friends and Family: More than three times a week    Frequency of Social Gatherings with Friends and Family: More than three times a week    Attends Religious Services: Never    Database administrator or Organizations: Yes    Attends Engineer, structural: More than 4 times per year    Marital Status: Divorced    Tobacco Counseling Counseling given: Not Answered   Clinical Intake:  Pre-visit preparation completed: Yes  Pain : 0-10 Pain Score: 6  Pain Type: Chronic pain Pain Location: Back Pain Orientation: Lower Pain Radiating Towards: right leg Pain Descriptors / Indicators: Aching, Throbbing Pain Onset: More than a month ago Pain Frequency: Constant Pain Relieving Factors: pain medication Effect of Pain on Daily Activities: When the pain is terrible she doesn't do much.  Pain Relieving Factors: pain medication  BMI - recorded: 27.1 Nutritional Status: BMI 25 -29 Overweight Nutritional Risks: None Diabetes: No  How often do you need to have someone help you when you read instructions, pamphlets, or other written materials from your doctor or pharmacy?: 1 - Never What is the last grade level you completed in school?: 14  Interpreter Needed?: No      Activities of Daily Living    08/04/2023    8:16 AM  In your present state of health, do you have any difficulty  performing the following activities:  Hearing? 0  Vision? 0  Difficulty concentrating or making decisions? 0  Walking or climbing stairs? 0  Dressing or bathing? 0  Doing errands, shopping? 0  Preparing Food and eating ? N  Using the Toilet? N  In the past six months, have you accidently leaked urine? N  Do you have problems with loss of bowel control? N  Managing your Medications? N  Managing your Finances? N  Housekeeping or managing your Housekeeping? N    Patient Care Team: Everrett Coombe, DO as PCP - General (Family Medicine) Dr. Fransisca Connors (Pain Medicine) Donalee Citrin, MD as Consulting Physician (Neurosurgery) Gabriel Carina, [facility] (Inactive) (Pharmacist) Gabriel Carina, [facility] (Inactive) as Pharmacist (Pharmacist)  Indicate any recent Medical Services you may have received from other than [facility] providers in the past year (date may be approximate).     Assessment:   This is a routine wellness examination for [place].  Hearing/Vision screen No results found.   Goals Addressed             This Visit's Progress    DIET - INCREASE WATER INTAKE       She would like to increase water intake.       Depression Screen    08/04/2023    8:23 AM 06/16/2023    8:37 AM 06/16/2023    8:36 AM 03/02/2023    9:55 AM 03/02/2023    9:36 AM 01/05/2023    8:54 AM 01/05/2023    8:51 AM  PHQ 2/9 Scores  PHQ - 2 Score 0 1 1 1 1 1 1   PHQ- 9 Score 0 4  7  4     Fall Risk    08/04/2023    8:25 AM 06/16/2023    8:36 AM 01/05/2023    8:54 AM 01/05/2023    8:51 AM 11/05/2022    2:22 PM  Fall Risk   Falls in the past year? 0 0 0 1 0  Number falls in past yr: 0 0 0 1 0  Injury with Fall? 0 0 0 0 0  Risk for fall due to : No Fall Risks No Fall Risks No Fall Risks History of fall(s) No Fall Risks  Follow up Falls evaluation completed Falls evaluation completed Falls evaluation completed Falls evaluation completed Falls evaluation completed    MEDICARE RISK AT HOME: Medicare Risk at Home Any  stairs in or around the home?: No Home free of loose throw rugs in walkways, pet beds, electrical cords, etc?: No Adequate lighting in your home to reduce risk of falls?: Yes Life alert?: No Use of a cane, walker or w/c?: No Grab bars in the bathroom?: No Shower chair or bench in shower?: Yes Elevated toilet seat or a handicapped toilet?: No  TIMED UP AND GO:  Was the test performed?  Yes  Length of time to ambulate 10 feet: 8 sec Gait steady and fast without use of assistive device    Cognitive Function:        08/04/2023    8:27 AM 08/03/2022    8:23 AM 07/28/2021    8:20 AM 07/24/2020    9:26 AM  6CIT Screen  What Year? 0 points 0 points 0 points 0 points  What month? 0 points 0 points 0 points 0 points  What time? 0 points 0 points 0 points 0 points  Count back from 20 0 points 0 points 0 points 0 points  Months in reverse 0 points 0 points 0 points 0 points  Repeat phrase 0 points 0 points 0 points 0 points  Total Score 0 points 0 points 0 points 0 points    Immunizations Immunization History  Administered Date(s) Administered   Influenza Split 02/15/2013   Influenza, Seasonal, Injecte, Preservative Fre 02/15/2023   Influenza,inj,Quad PF,6+ Mos 01/13/2018, 01/10/2019, 02/19/2021, 02/05/2022   Influenza-Unspecified 02/10/2015, 02/11/2017   Moderna Sars-Covid-2 Vaccination 10/17/2019, 11/07/2019   PPD Test 10/17/2014   Tdap 05/18/2009, 02/15/2023   Zoster Recombinant(Shingrix) 02/05/2022    TDAP status: Up to date  Flu Vaccine status: Up to date   Covid-19 vaccine status: Completed vaccines  Qualifies for Shingles Vaccine? Yes   Zostavax completed No   Shingrix Completed?: No.    Education has been provided regarding the importance of this vaccine. Patient has been advised to call insurance company to determine out of pocket expense if they have not yet received this vaccine. Advised may also receive vaccine at local pharmacy or Health Dept. Verbalized acceptance  and understanding.  Screening Tests Health Maintenance  Topic Date Due   Pneumococcal Vaccine 101-36 Years old (1 of 2 - PCV) Never done   Hepatitis C Screening  Never done   COVID-19 Vaccine (3 - Moderna risk series) 12/05/2019   Zoster Vaccines- Shingrix (2 of 2) 04/02/2022   Colonoscopy  07/05/2023   MAMMOGRAM  08/29/2023   Medicare Annual Wellness (AWV)  08/03/2024   DTaP/Tdap/Td (3 - Td or Tdap) 02/14/2033   INFLUENZA VACCINE  Completed   HIV Screening  Completed   HPV VACCINES  Aged Out    Health Maintenance  Health Maintenance Due  Topic Date Due  Pneumococcal Vaccine 11-8 Years old (1 of 2 - PCV) Never done   Hepatitis C Screening  Never done   COVID-19 Vaccine (3 - Moderna risk series) 12/05/2019   Zoster Vaccines- Shingrix (2 of 2) 04/02/2022   Colonoscopy  07/05/2023    Colorectal cancer screening: Referral to GI placed 08/04/2023. Pt aware the office will call re: appt.  Mammogram status: Ordered 08/04/2023. Pt provided with contact info and advised to call to schedule appt.    Lung Cancer Screening: (Low Dose CT Chest recommended if Age 12-80 years, 20 pack-year currently smoking OR have quit w/in 15years.) does not qualify.   Lung Cancer Screening Referral: n/a  Additional Screening:  Hepatitis C Screening: does qualify; Completed not completed  Vision Screening: Recommended annual ophthalmology exams for early detection of glaucoma and other disorders of the eye. Is the patient up to date with their annual eye exam?  Yes  Who is the provider or what is the name of the office in which the patient attends annual eye exams? [facility] If pt is not established with a provider, would they like to be referred to a provider to establish care?  N/a .   Dental Screening: Recommended annual dental exams for proper oral hygiene    Community Resource Referral / Chronic Care Management: CRR required this visit?  No   CCM required this visit?  No     Plan:      I have personally reviewed and noted the following in the patient's chart:   Medical and social history Use of alcohol, tobacco or illicit drugs  Current medications and supplements including opioid prescriptions. Patient is currently taking opioid prescriptions. Information provided to patient regarding non-opioid alternatives. Patient advised to discuss non-opioid treatment plan with their provider. Functional ability and status Nutritional status Physical activity Advanced directives List of other physicians Hospitalizations, surgeries, and ER visits in previous 12 months Vitals Screenings to include cognitive, depression, and falls Referrals and appointments  In addition, I have reviewed and discussed with patient certain preventive protocols, quality metrics, and best practice recommendations. A written personalized care plan for preventive services as well as general preventive health recommendations were provided to patient.     Esmond Harps, CMA   08/04/2023   After Visit Summary: (In Person-Declined) Patient declined AVS at this time.  Nurse Notes:    HAVEN PYLANT is a 58 y.o. female patient of Everrett Coombe, DO who had a Medicare Annual Wellness Visit today. Dedria is Disabled and lives with their family. she has 2 children. She reports that she is socially active and does interact with friends/family regularly. She is moderately physically active and enjoys crafting and quilting.   Referral placed for colonoscopy screening.   Order placed for mammogram.

## 2023-08-04 NOTE — Patient Instructions (Signed)
  Ms. Coltrin , Thank you for taking time to come for your Medicare Wellness Visit. I appreciate your ongoing commitment to your health goals. Please review the following plan we discussed and let me know if I can assist you in the future.   These are the goals we discussed:  Goals       DIET - INCREASE WATER INTAKE      She would like to increase water intake.       Patient Stated (pt-stated)      07/24/2020 AWV Goal: Improved Nutrition/Diet  Patient will verbalize understanding that diet plays an important role in overall health and that a poor diet is a risk factor for many chronic medical conditions.  Over the next year, patient will improve self management of their diet by incorporating water. Patient will utilize available community resources to help with food acquisition if needed (ex: food pantries, [address], etc) Patient will work with nutrition specialist if a referral was made       Patient Stated (pt-stated)      Would like to loose 30 lbs      Patient Stated (pt-stated)      Patient stated that she would like to make an exercise routine and also loose 10 more lbs.        This is a list of the screening recommended for you and due dates:  Health Maintenance  Topic Date Due   Pneumococcal Vaccination (1 of 2 - PCV) Never done   Hepatitis C Screening  Never done   COVID-19 Vaccine (3 - Moderna risk series) 12/05/2019   Zoster (Shingles) Vaccine (2 of 2) 04/02/2022   Colon Cancer Screening  07/05/2023   Mammogram  08/29/2023   Medicare Annual Wellness Visit  08/03/2024   DTaP/Tdap/Td vaccine (3 - Td or Tdap) 02/14/2033   Flu Shot  Completed   HIV Screening  Completed   HPV Vaccine  Aged Out

## 2023-08-18 DIAGNOSIS — M5417 Radiculopathy, lumbosacral region: Secondary | ICD-10-CM | POA: Diagnosis not present

## 2023-08-18 DIAGNOSIS — M25559 Pain in unspecified hip: Secondary | ICD-10-CM | POA: Diagnosis not present

## 2023-08-18 DIAGNOSIS — Z79899 Other long term (current) drug therapy: Secondary | ICD-10-CM | POA: Diagnosis not present

## 2023-08-18 DIAGNOSIS — M961 Postlaminectomy syndrome, not elsewhere classified: Secondary | ICD-10-CM | POA: Diagnosis not present

## 2023-08-18 DIAGNOSIS — Z5181 Encounter for therapeutic drug level monitoring: Secondary | ICD-10-CM | POA: Diagnosis not present

## 2023-08-18 DIAGNOSIS — Z981 Arthrodesis status: Secondary | ICD-10-CM | POA: Diagnosis not present

## 2023-08-18 DIAGNOSIS — M542 Cervicalgia: Secondary | ICD-10-CM | POA: Diagnosis not present

## 2023-08-23 ENCOUNTER — Encounter: Payer: Self-pay | Admitting: Family Medicine

## 2023-08-27 ENCOUNTER — Ambulatory Visit

## 2023-08-27 ENCOUNTER — Ambulatory Visit: Admitting: Family Medicine

## 2023-08-27 ENCOUNTER — Encounter: Payer: Self-pay | Admitting: Family Medicine

## 2023-08-27 VITALS — BP 113/79 | HR 78 | Ht 63.0 in | Wt 154.0 lb

## 2023-08-27 DIAGNOSIS — R1031 Right lower quadrant pain: Secondary | ICD-10-CM

## 2023-08-27 DIAGNOSIS — R1030 Lower abdominal pain, unspecified: Secondary | ICD-10-CM | POA: Insufficient documentation

## 2023-08-27 DIAGNOSIS — R3989 Other symptoms and signs involving the genitourinary system: Secondary | ICD-10-CM

## 2023-08-27 DIAGNOSIS — Z981 Arthrodesis status: Secondary | ICD-10-CM | POA: Diagnosis not present

## 2023-08-27 DIAGNOSIS — R319 Hematuria, unspecified: Secondary | ICD-10-CM | POA: Diagnosis not present

## 2023-08-27 DIAGNOSIS — K5900 Constipation, unspecified: Secondary | ICD-10-CM | POA: Diagnosis not present

## 2023-08-27 LAB — POCT URINALYSIS DIP (CLINITEK)
Bilirubin, UA: NEGATIVE
Blood, UA: NEGATIVE
Glucose, UA: NEGATIVE mg/dL
Ketones, POC UA: NEGATIVE mg/dL
Nitrite, UA: NEGATIVE
POC PROTEIN,UA: NEGATIVE
Spec Grav, UA: 1.025 (ref 1.010–1.025)
Urobilinogen, UA: 1 U/dL
pH, UA: 6.5 (ref 5.0–8.0)

## 2023-08-27 NOTE — Assessment & Plan Note (Signed)
 Possibly related to constipation as she has had hysterectomy with BSO and appendectomy in the past.  UA with trace leukocytes and this will be sent for culture.  KUB Ordered to evluate for excess stool burden.

## 2023-08-27 NOTE — Progress Notes (Signed)
 Katrina Fernandez - 58 y.o. female MRN [id number]  Date of birth: 1966-04-13  Subjective Chief Complaint  Patient presents with   Anxiety   Pelvic Pain    HPI Katrina Fernandez is a 58 y.o. female here today with complaint of lower pelvic pain.  Symptoms started a couple of weeks ago but though she should get this checked out when symptoms were not improving. Denies dysuria, frequency, urgency.  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    She has no had fever, chills, nausea or vomiting.    ROS:  A comprehensive ROS was completed and negative except as noted per HPI  Allergies  Allergen Reactions   Penicillins Anaphylaxis    11/28/20 - patient tolerated Keflex w/o adverse effects    Tape Rash    Plastic tape, silicone tape, Steri Strips   Bactrim [Sulfamethoxazole-Trimethoprim] Nausea Only   Paxlovid [Nirmatrelvir-Ritonavir] Nausea And Vomiting   Chlorhexidine Rash and Other (See Comments)    Dermatitis - Allergic    Past Medical History:  Diagnosis Date   Anemia    Anxiety    Arthritis    "back and knees" (07/23/2016)   Chiari malformation type I (HCC) dx'd 2014   Chronic back pain    buldging, DDD; "neck and lower back" (07/23/2016)   Chronic constipation    takes Miralax daily   Chronic pain syndrome    takes Methadone and Keppra daily   Depression    takes Effexor daily   Endometriosis    1995   Fibromyalgia    GERD (gastroesophageal reflux disease)    not taking anything at the present time   Headache    History of DVT of lower extremity 07/2012   LEFT LEG --  3/ 2014.Was on a blood transfusion for 6 months   History of kidney stones    History of MRSA infection 10 yrs ago   Hyperlipidemia    takes Simvastatin daily   Migraine    "daily recently" (07/23/2016)   Muscle spasm    takes Zanaflex nightly   Peripheral edema    takes Lasix daily as needed   Peripheral neuropathy    Pleurisy 2000   Pneumonia    hx of-7-8 yrs ago   PONV  (postoperative nausea and vomiting)    Seasonal allergies    takes Claritin daily   Spinal headache    "since 2002; still get them; try to manage it w/lying flat"    Past Surgical History:  Procedure Laterality Date   ANTERIOR CERVICAL DECOMP/DISCECTOMY FUSION  07/25/2009   C5 -- C6   APPENDECTOMY  age 68   APPLICATION OF CRANIAL NAVIGATION N/A 06/23/2017   Procedure: APPLICATION OF CRANIAL NAVIGATION;  Surgeon: Donalee Citrin, MD;  Location: [facility] OR;  Service: Neurosurgery;  Laterality: N/A;   BACK SURGERY     BRAIN SURGERY     VP shunt   BREAST REDUCTION SURGERY Bilateral 06/07/2013   Procedure: BILATERAL MAMMARY REDUCTION  (BREAST);  Surgeon: Wayland Denis, DO;  Location: [facility];  Service: Plastics;  Laterality: Bilateral;   BREAST REDUCTION SURGERY Bilateral 06/07/2013   Procedure:  LIPOSUCTION;  Surgeon: Wayland Denis, DO;  Location: [facility];  Service: Plastics;  Laterality: Bilateral;   CARPAL TUNNEL RELEASE Bilateral 2001   COLONOSCOPY  X 2   "polyps were benign"   CYSTOSCOPY WITH RETROGRADE PYELOGRAM, URETEROSCOPY AND STENT PLACEMENT Left 05/2021   (in my chart)   DILATION AND CURETTAGE  OF UTERUS     ESOPHAGOGASTRODUODENOSCOPY     gastric banding undone  11/2014   LAPAROSCOPIC CHOLECYSTECTOMY  ~ 2000   LAPAROSCOPIC GASTRIC BANDING  2011   LAPAROSCOPIC REVISION VENTRICULAR-PERITONEAL (V-P) SHUNT N/A 06/23/2017   Procedure: Shunt Placment stereotactic ventricular catheter placement and general surgery abdominal exposure  (Laparoscopic vs. Open) Brain Lab;  Surgeon: Berna Bue, MD;  Location: [facility] OR;  Service: General;  Laterality: N/A;   LAPAROSCOPY N/A 06/23/2017   Procedure: LAPAROSCOPY DIAGNOSTIC;  Surgeon: Berna Bue, MD;  Location: [facility] OR;  Service: General;  Laterality: N/A;   LUMBAR LAMINECTOMY/DECOMPRESSION MICRODISCECTOMY N/A 07/23/2016   Procedure: Decompressive Lumbar Laminectomy Lumbar two-three  for Repair of  Cerberal  Spinal Fluid  Leak;  Surgeon: Donalee Citrin, MD;  Location: [facility] OR;  Service: Neurosurgery;  Laterality: N/A;   LUMBAR LAMINECTOMY/DECOMPRESSION MICRODISCECTOMY Left 03/01/2019   Procedure: LEFT LUMBAR THREE-FOUR EXTRAFORAMINAL MICRODISCECTOMY;  Surgeon: Donalee Citrin, MD;  Location: [facility] OR;  Service: Neurosurgery;  Laterality: Left;   PLACEMENT LUMBOPERITONEAL SHUNT FOR CEREBROSPINAL FLUID DIVERSION AWAY FROM C8 PERINEURAL CYST  09-05-2003  &  12-24-2003   08-31-2003  REMOVAL SHUNT   POSTERIOR CERVICAL LAMINECTOMY  03/01/2001   C7-8 and Exploration C8 perineural cyst   POSTERIOR LUMBAR LAMINECTOMY L4-5/  DISKECTOMY L5-S1  05/20/2005   RE-DO DECOMPRESSION LAMINECTOMY AND FUSION L4-5  &  L5-S1  12/20/2006   02-04-2007--  RE-EXPLORATION L4 to S1, I & D LUMBAR WOUND HEMATOMA   REDUCTION MAMMAPLASTY     SHUNT REMOVAL N/A 07/10/2016   Procedure: SHUNT REMOVAL;  Surgeon: Donalee Citrin, MD;  Location: [facility] OR;  Service: Neurosurgery;  Laterality: N/A;  SHUNT REMOVAL   SPINAL CORD STIMULATOR INSERTION N/A 04/26/2015   Procedure: LUMBAR SPINAL CORD STIMULATOR INSERTION;  Surgeon: Odette Fraction, MD;  Location: [facility];  Service: Neurosurgery;  Laterality: N/A;  LUMBAR SPINAL CORD STIMULATOR INSERTION   SPINAL CORD STIMULATOR REMOVAL N/A 06/28/2019   Procedure: Removal of dorsal column stimulator and generator -;  Surgeon: Donalee Citrin, MD;  Location: [facility] OR;  Service: Neurosurgery;  Laterality: N/A;   TENNIS ELBOW RELEASE/NIRSCHEL PROCEDURE Left 12/13/2014   Procedure: LEFT ELBOW LATERAL EPICONDYLAR DEBRIDEMENT AND OR REPAIR -RECONSTRUCTION ;  Surgeon: Bradly Bienenstock, MD;  Location: [facility];  Service: Orthopedics;  Laterality: Left;   TOTAL ABDOMINAL HYSTERECTOMY  1994   w/  Bilateral Salpinoophorectomy   VENTRICULOPERITONEAL SHUNT N/A 06/23/2017   Procedure: VENTRICULAR-PERITONEAL SHUNT INSERTION WITH ABDOMINAL LAPARASCOPY AND BRAINLAB NAVIGATION;  Surgeon: Donalee Citrin, MD;  Location: [facility] OR;  Service:  Neurosurgery;  Laterality: N/A;    Social History   Socioeconomic History   Marital status: Divorced    Spouse name: Not on file   Number of children: 2   Years of education: 14   Highest education level: Associate degree: academic program  Occupational History   Occupation: Disabled   Tobacco Use   Smoking status: Never   Smokeless tobacco: Never  Vaping Use   Vaping status: Never Used  Substance and Sexual Activity   Alcohol use: Yes    Comment: once a year   Drug use: No   Sexual activity: Not Currently    Birth control/protection: Surgical  Other Topics Concern   Not on file  Social History Narrative   Mrs Nistler is divorced, on permanent disability and lives at home with her mother and her daughter. She is independent in her care needs. She denies concern with transportation to medical appointments She is  on a fixed income. She enjoys crafting and quilting.   Social Drivers of Corporate investment banker Strain: Low Risk  (08/04/2023)   Overall Financial Resource Strain (CARDIA)    Difficulty of Paying Living Expenses: Not hard at all  Recent Concern: Financial Resource Strain - Medium Risk (06/12/2023)   Overall Financial Resource Strain (CARDIA)    Difficulty of Paying Living Expenses: Somewhat hard  Food Insecurity: No Food Insecurity (08/04/2023)   Hunger Vital Sign    Worried About Running Out of Food in the Last Year: Never true    Ran Out of Food in the Last Year: Never true  Recent Concern: Food Insecurity - Food Insecurity Present (06/12/2023)   Hunger Vital Sign    Worried About Running Out of Food in the Last Year: Sometimes true    Ran Out of Food in the Last Year: Sometimes true  Transportation Needs: No Transportation Needs (08/04/2023)   PRAPARE - Administrator, Civil Service (Medical): No    Lack of Transportation (Non-Medical): No  Physical Activity: Sufficiently Active (08/04/2023)   Exercise Vital Sign    Days of Exercise per Week: 3  days    Minutes of Exercise per Session: 90 min  Recent Concern: Physical Activity - Inactive (06/12/2023)   Exercise Vital Sign    Days of Exercise per Week: 0 days    Minutes of Exercise per Session: 0 min  Stress: No Stress Concern Present (08/04/2023)   [employer] of Occupational Health - Occupational Stress Questionnaire    Feeling of Stress : Not at all  Social Connections: Moderately Isolated (08/04/2023)   Social Connection and Isolation Panel [NHANES]    Frequency of Communication with Friends and Family: More than three times a week    Frequency of Social Gatherings with Friends and Family: More than three times a week    Attends Religious Services: Never    Database administrator or Organizations: Yes    Attends Engineer, structural: More than 4 times per year    Marital Status: Divorced    Family History  Problem Relation Age of Onset   COPD Mother    Diabetes Mother    Asthma Mother    Diabetes Father    Colon cancer Father    Ovarian cancer Maternal Grandmother    Pancreatic cancer Maternal Grandfather    Depression Other     Health Maintenance  Topic Date Due   Hepatitis C Screening  Never done   Pneumococcal Vaccine 71-4 Years old (1 of 2 - PCV) Never done   Zoster Vaccines- Shingrix (2 of 2) 04/02/2022   Colonoscopy  07/05/2023   COVID-19 Vaccine (3 - Moderna risk series) 02/26/2024 (Originally 12/05/2019)   MAMMOGRAM  08/29/2023   INFLUENZA VACCINE  12/17/2023   Medicare Annual Wellness (AWV)  08/03/2024   DTaP/Tdap/Td (3 - Td or Tdap) 02/14/2033   HIV Screening  Completed   HPV VACCINES  Aged Out   Meningococcal B Vaccine  Aged Out     ----------------------------------------------------------------------------------------------------------------------------------------------------------------------------------------------------------------- Physical Exam BP 113/79 (BP Location: Left Arm, Patient Position: Sitting, Cuff Size: Normal)    Pulse 78   Ht 5\' 3"  (1.6 m)   Wt 154 lb (69.9 kg)   SpO2 98%   BMI 27.28 kg/m   Physical Exam Constitutional:      Appearance: Normal appearance.  HENT:     Head: Normocephalic and atraumatic.  Eyes:     General: No scleral icterus.  Cardiovascular:     Rate and Rhythm: Normal rate and regular rhythm.  Pulmonary:     Effort: Pulmonary effort is normal.     Breath sounds: Normal breath sounds.  Neurological:     Mental Status: She is alert.  Psychiatric:        Mood and Affect: Mood normal.        Behavior: Behavior normal.     ------------------------------------------------------------------------------------------------------------------------------------------------------------------------------------------------------------------- Assessment and Plan  Lower abdominal pain Possibly related to constipation as she has had hysterectomy with BSO and appendectomy in the past.  UA with trace leukocytes and this will be sent for culture.  KUB Ordered to evluate for excess stool burden.    No orders of the defined types were placed in this encounter.   No follow-ups on file.

## 2023-08-29 LAB — URINE CULTURE

## 2023-09-06 ENCOUNTER — Encounter: Payer: Self-pay | Admitting: Family Medicine

## 2023-09-06 ENCOUNTER — Ambulatory Visit (INDEPENDENT_AMBULATORY_CARE_PROVIDER_SITE_OTHER)

## 2023-09-06 DIAGNOSIS — N200 Calculus of kidney: Secondary | ICD-10-CM | POA: Diagnosis not present

## 2023-09-06 DIAGNOSIS — R1030 Lower abdominal pain, unspecified: Secondary | ICD-10-CM | POA: Diagnosis not present

## 2023-09-06 DIAGNOSIS — R102 Pelvic and perineal pain: Secondary | ICD-10-CM | POA: Diagnosis not present

## 2023-09-06 DIAGNOSIS — R1031 Right lower quadrant pain: Secondary | ICD-10-CM

## 2023-09-06 DIAGNOSIS — R1032 Left lower quadrant pain: Secondary | ICD-10-CM | POA: Diagnosis not present

## 2023-09-06 DIAGNOSIS — K5730 Diverticulosis of large intestine without perforation or abscess without bleeding: Secondary | ICD-10-CM | POA: Diagnosis not present

## 2023-09-06 MED ORDER — IOHEXOL 9 MG/ML PO SOLN
500.0000 mL | ORAL | Status: AC
  Administered 2023-09-06 (×2): 500 mL via ORAL

## 2023-09-06 MED ORDER — IOHEXOL 300 MG/ML  SOLN
100.0000 mL | Freq: Once | INTRAMUSCULAR | Status: AC | PRN
  Administered 2023-09-06: 100 mL via INTRAVENOUS

## 2023-09-09 ENCOUNTER — Ambulatory Visit

## 2023-09-09 ENCOUNTER — Encounter: Payer: Self-pay | Admitting: Family Medicine

## 2023-09-09 DIAGNOSIS — Z1231 Encounter for screening mammogram for malignant neoplasm of breast: Secondary | ICD-10-CM | POA: Diagnosis not present

## 2023-10-20 DIAGNOSIS — M961 Postlaminectomy syndrome, not elsewhere classified: Secondary | ICD-10-CM | POA: Diagnosis not present

## 2023-10-20 DIAGNOSIS — M542 Cervicalgia: Secondary | ICD-10-CM | POA: Diagnosis not present

## 2023-10-20 DIAGNOSIS — Z79899 Other long term (current) drug therapy: Secondary | ICD-10-CM | POA: Diagnosis not present

## 2023-10-20 DIAGNOSIS — M25559 Pain in unspecified hip: Secondary | ICD-10-CM | POA: Diagnosis not present

## 2023-10-20 DIAGNOSIS — M5417 Radiculopathy, lumbosacral region: Secondary | ICD-10-CM | POA: Diagnosis not present

## 2023-10-20 DIAGNOSIS — Z981 Arthrodesis status: Secondary | ICD-10-CM | POA: Diagnosis not present

## 2023-11-12 ENCOUNTER — Other Ambulatory Visit: Payer: Self-pay | Admitting: Family Medicine

## 2023-12-13 ENCOUNTER — Ambulatory Visit (INDEPENDENT_AMBULATORY_CARE_PROVIDER_SITE_OTHER): Payer: Medicare HMO | Admitting: Family Medicine

## 2023-12-13 VITALS — BP 90/62 | HR 81 | Ht 63.0 in | Wt 149.0 lb

## 2023-12-13 DIAGNOSIS — F419 Anxiety disorder, unspecified: Secondary | ICD-10-CM

## 2023-12-13 DIAGNOSIS — Z1211 Encounter for screening for malignant neoplasm of colon: Secondary | ICD-10-CM

## 2023-12-13 DIAGNOSIS — G43919 Migraine, unspecified, intractable, without status migrainosus: Secondary | ICD-10-CM

## 2023-12-13 DIAGNOSIS — G43909 Migraine, unspecified, not intractable, without status migrainosus: Secondary | ICD-10-CM | POA: Insufficient documentation

## 2023-12-13 MED ORDER — NURTEC 75 MG PO TBDP
ORAL_TABLET | ORAL | Status: AC
Start: 2023-12-13 — End: ?

## 2023-12-13 NOTE — Progress Notes (Signed)
 Katrina Fernandez - 58 y.o. female MRN [id number]  Date of birth: [birth year]  Subjective Chief Complaint  Patient presents with   Headache   Mood    HPI Katrina Fernandez is a 58 y.o. female here today for follow up visit.  She reports she is having increased frequency of headaches.  Headaches are in the back of the head.  Sometimes include the right side of the head.  She does have history of migraines.  She does have shunt.  Headaches are intermittent in nature.  She has been a little bit of nausea with this at times.  Denies vision changes.  She has tried multiple medications in the past.  She is on chronic opioid therapy.  She did try increasing her Lexapro  but had significant night sweats with this.  Overall feels that mood and anxiety are stable.  She does not want make any further changes at this time.  ROS:  A comprehensive ROS was completed and negative except as noted per HPI   Allergies  Allergen Reactions   Penicillins Anaphylaxis    11/28/20 - patient tolerated Keflex  w/o adverse effects    Tape Rash    Plastic tape, silicone tape, Steri Strips   Bactrim  [Sulfamethoxazole -Trimethoprim ] Nausea Only   Paxlovid [Nirmatrelvir-Ritonavir] Nausea And Vomiting   Chlorhexidine  Rash and Other (See Comments)    Dermatitis - Allergic    Past Medical History:  Diagnosis Date   Allergy    Anemia    Anxiety    Arthritis    back and knees (07/23/2016)   Chiari malformation type I (HCC) dx'd 2014   Chronic back pain    buldging, DDD; neck and lower back (07/23/2016)   Chronic constipation    takes Miralax  daily   Chronic pain syndrome    takes Methadone  and Keppra  daily   Depression    takes Effexor  daily   Endometriosis    1995   Fibromyalgia    GERD (gastroesophageal reflux disease)    not taking anything at the present time   Headache    History of DVT of lower extremity 07/2012   LEFT LEG --  3/ 2014.Was on a blood transfusion for 6 months   History of kidney  stones    History of MRSA infection 10 yrs ago   Hyperlipidemia    takes Simvastatin  daily   Migraine    daily recently (07/23/2016)   Muscle spasm    takes Zanaflex  nightly   Peripheral edema    takes Lasix  daily as needed   Peripheral neuropathy    Pleurisy 2000   Pneumonia    hx of-7-8 yrs ago   PONV (postoperative nausea and vomiting)    Seasonal allergies    takes Claritin  daily   Spinal headache    since 2002; still get them; try to manage it w/lying flat    Past Surgical History:  Procedure Laterality Date   ANTERIOR CERVICAL DECOMP/DISCECTOMY FUSION  07/25/2009   C5 -- C6   APPENDECTOMY  age 57   APPLICATION OF CRANIAL NAVIGATION N/A 06/23/2017   Procedure: APPLICATION OF CRANIAL NAVIGATION;  Surgeon: Onetha Kuba, MD;  Location: [facility] OR;  Service: Neurosurgery;  Laterality: N/A;   BACK SURGERY     BRAIN SURGERY     VP shunt   BREAST REDUCTION SURGERY Bilateral 06/07/2013   Procedure: BILATERAL MAMMARY REDUCTION  (BREAST);  Surgeon: Estefana Reichert, DO;  Location:  SURGERY CENTER;  Service: Plastics;  Laterality: Bilateral;  BREAST REDUCTION SURGERY Bilateral 06/07/2013   Procedure:  LIPOSUCTION;  Surgeon: Estefana Reichert, DO;  Location: [facility];  Service: Plastics;  Laterality: Bilateral;   CARPAL TUNNEL RELEASE Bilateral 2001   COLONOSCOPY  X 2   polyps were benign   CYSTOSCOPY WITH RETROGRADE PYELOGRAM, URETEROSCOPY AND STENT PLACEMENT Left 05/2021   (in my chart)   DILATION AND CURETTAGE OF UTERUS     ESOPHAGOGASTRODUODENOSCOPY     gastric banding undone  11/2014   LAPAROSCOPIC CHOLECYSTECTOMY  ~ 2000   LAPAROSCOPIC GASTRIC BANDING  2011   LAPAROSCOPIC REVISION VENTRICULAR-PERITONEAL (V-P) SHUNT N/A 06/23/2017   Procedure: Shunt Placment stereotactic ventricular catheter placement and general surgery abdominal exposure  (Laparoscopic vs. Open) Brain Lab;  Surgeon: Signe Mitzie LABOR, MD;  Location: [facility] OR;  Service: General;   Laterality: N/A;   LAPAROSCOPY N/A 06/23/2017   Procedure: LAPAROSCOPY DIAGNOSTIC;  Surgeon: Signe Mitzie LABOR, MD;  Location: [facility] OR;  Service: General;  Laterality: N/A;   LUMBAR LAMINECTOMY/DECOMPRESSION MICRODISCECTOMY N/A 07/23/2016   Procedure: Decompressive Lumbar Laminectomy Lumbar two-three  for Repair of  Cerberal Spinal Fluid  Leak;  Surgeon: Arley Helling, MD;  Location: [facility] OR;  Service: Neurosurgery;  Laterality: N/A;   LUMBAR LAMINECTOMY/DECOMPRESSION MICRODISCECTOMY Left 03/01/2019   Procedure: LEFT LUMBAR THREE-FOUR EXTRAFORAMINAL MICRODISCECTOMY;  Surgeon: Helling Arley, MD;  Location: [facility] OR;  Service: Neurosurgery;  Laterality: Left;   PLACEMENT LUMBOPERITONEAL SHUNT FOR CEREBROSPINAL FLUID DIVERSION AWAY FROM C8 PERINEURAL CYST  09-05-2003  &  12-24-2003   08-31-2003  REMOVAL SHUNT   POSTERIOR CERVICAL LAMINECTOMY  03/01/2001   C7-8 and Exploration C8 perineural cyst   POSTERIOR LUMBAR LAMINECTOMY L4-5/  DISKECTOMY L5-S1  05/20/2005   RE-DO DECOMPRESSION LAMINECTOMY AND FUSION L4-5  &  L5-S1  12/20/2006   02-04-2007--  RE-EXPLORATION L4 to S1, I & D LUMBAR WOUND HEMATOMA   REDUCTION MAMMAPLASTY     SHUNT REMOVAL N/A 07/10/2016   Procedure: SHUNT REMOVAL;  Surgeon: Arley Helling, MD;  Location: [facility] OR;  Service: Neurosurgery;  Laterality: N/A;  SHUNT REMOVAL   SPINAL CORD STIMULATOR INSERTION N/A 04/26/2015   Procedure: LUMBAR SPINAL CORD STIMULATOR INSERTION;  Surgeon: Deward Fabian, MD;  Location: [facility];  Service: Neurosurgery;  Laterality: N/A;  LUMBAR SPINAL CORD STIMULATOR INSERTION   SPINAL CORD STIMULATOR REMOVAL N/A 06/28/2019   Procedure: Removal of dorsal column stimulator and generator -;  Surgeon: Helling Arley, MD;  Location: [facility] OR;  Service: Neurosurgery;  Laterality: N/A;   SPINE SURGERY  2002-2021   I've had many spinal surgeries.   TENNIS ELBOW RELEASE/NIRSCHEL PROCEDURE Left 12/13/2014   Procedure: LEFT ELBOW LATERAL EPICONDYLAR DEBRIDEMENT AND OR REPAIR  -RECONSTRUCTION ;  Surgeon: Prentice Pagan, MD;  Location: [facility];  Service: Orthopedics;  Laterality: Left;   TOTAL ABDOMINAL HYSTERECTOMY  1994   w/  Bilateral Salpinoophorectomy   VENTRICULOPERITONEAL SHUNT N/A 06/23/2017   Procedure: VENTRICULAR-PERITONEAL SHUNT INSERTION WITH ABDOMINAL LAPARASCOPY AND BRAINLAB NAVIGATION;  Surgeon: Helling Arley, MD;  Location: [facility] OR;  Service: Neurosurgery;  Laterality: N/A;    Social History   Socioeconomic History   Marital status: Divorced    Spouse name: Not on file   Number of children: 2   Years of education: 14   Highest education level: Associate degree: academic program  Occupational History   Occupation: Disabled   Tobacco Use   Smoking status: Never   Smokeless tobacco: Never  Vaping Use   Vaping status: Never Used  Substance and  Sexual Activity   Alcohol use: Yes    Comment: once a year   Drug use: No   Sexual activity: Not Currently    Birth control/protection: Surgical  Other Topics Concern   Not on file  Social History Narrative   Mrs Karam is divorced, on permanent disability and lives at home with her mother and her daughter. She is independent in her care needs. She denies concern with transportation to medical appointments She is on a fixed income. She enjoys crafting and quilting.   Social Drivers of Health   Financial Resource Strain: Medium Risk (12/13/2023)   Overall Financial Resource Strain (CARDIA)    Difficulty of Paying Living Expenses: Somewhat hard  Food Insecurity: Food Insecurity Present (12/13/2023)   Hunger Vital Sign    Worried About Running Out of Food in the Last Year: Sometimes true    Ran Out of Food in the Last Year: Never true  Transportation Needs: No Transportation Needs (12/13/2023)   PRAPARE - Administrator, Civil Service (Medical): No    Lack of Transportation (Non-Medical): No  Physical Activity: Inactive (12/13/2023)   Exercise Vital Sign    Days of  Exercise per Week: 0 days    Minutes of Exercise per Session: Not on file  Stress: Stress Concern Present (12/13/2023)   [employer] of Occupational Health - Occupational Stress Questionnaire    Feeling of Stress: To some extent  Social Connections: Socially Isolated (12/13/2023)   Social Connection and Isolation Panel    Frequency of Communication with Friends and Family: More than three times a week    Frequency of Social Gatherings with Friends and Family: Once a week    Attends Religious Services: Never    Database administrator or Organizations: No    Attends Engineer, structural: Not on file    Marital Status: Divorced    Family History  Problem Relation Age of Onset   COPD Mother    Diabetes Mother    Asthma Mother    Anxiety disorder Mother    Arthritis Mother    Depression Mother    Varicose Veins Mother    Diabetes Father    Colon cancer Father    Obesity Father    Ovarian cancer Maternal Grandmother    Pancreatic cancer Maternal Grandfather    Heart disease Maternal Grandfather    Depression Other     Health Maintenance  Topic Date Due   Hepatitis C Screening  Never done   Pneumococcal Vaccine 30-89 Years old (1 of 2 - PCV) Never done   Hepatitis B Vaccines (1 of 3 - 19+ 3-dose series) Never done   Zoster Vaccines- Shingrix  (2 of 2) 04/02/2022   Colonoscopy  07/05/2023   COVID-19 Vaccine (3 - Moderna risk series) 02/26/2024 (Originally 12/05/2019)   INFLUENZA VACCINE  12/17/2023   Medicare Annual Wellness (AWV)  08/03/2024   MAMMOGRAM  09/08/2025   DTaP/Tdap/Td (3 - Td or Tdap) 02/14/2033   HIV Screening  Completed   HPV VACCINES  Aged Out   Meningococcal B Vaccine  Aged Out     ----------------------------------------------------------------------------------------------------------------------------------------------------------------------------------------------------------------- Physical Exam BP 90/62 (BP Location: Left Arm, Patient  Position: Sitting, Cuff Size: Normal)   Pulse 81   Ht 5' 3 (1.6 m)   Wt 149 lb (67.6 kg)   SpO2 96%   BMI 26.39 kg/m   Physical Exam Constitutional:      Appearance: Normal appearance.  Eyes:     General:  No scleral icterus. Cardiovascular:     Rate and Rhythm: Normal rate and regular rhythm.  Pulmonary:     Effort: Pulmonary effort is normal.     Breath sounds: Normal breath sounds.  Neurological:     Mental Status: She is alert.  Psychiatric:        Mood and Affect: Mood normal.        Behavior: Behavior normal.     ------------------------------------------------------------------------------------------------------------------------------------------------------------------------------------------------------------------- Assessment and Plan  Anxiety Try to increase of Lexapro  to 10 mg but did not tolerate well.  Overall doing okay with Lexapro  5 mg for now.  Migraine  Her headaches seem more migrainous in nature.  Discussed trial of Nurtec.  I think completion of her shot is unlikely but if headaches continue I recommend she follow-up with her neurosurgeon.   Meds ordered this encounter  Medications   Rimegepant Sulfate (NURTEC) 75 MG TBDP    Sig: Take 1 tab po daily prn.  Lot: [id number].  Exp: 06/26    No follow-ups on file.

## 2023-12-13 NOTE — Assessment & Plan Note (Signed)
 Try to increase of Lexapro  to 10 mg but did not tolerate well.  Overall doing okay with Lexapro  5 mg for now.

## 2023-12-13 NOTE — Assessment & Plan Note (Signed)
  Her headaches seem more migrainous in nature.  Discussed trial of Nurtec.  I think completion of her shot is unlikely but if headaches continue I recommend she follow-up with her neurosurgeon.

## 2023-12-15 DIAGNOSIS — M5417 Radiculopathy, lumbosacral region: Secondary | ICD-10-CM | POA: Diagnosis not present

## 2023-12-15 DIAGNOSIS — M961 Postlaminectomy syndrome, not elsewhere classified: Secondary | ICD-10-CM | POA: Diagnosis not present

## 2023-12-15 DIAGNOSIS — M542 Cervicalgia: Secondary | ICD-10-CM | POA: Diagnosis not present

## 2023-12-15 DIAGNOSIS — Z981 Arthrodesis status: Secondary | ICD-10-CM | POA: Diagnosis not present

## 2023-12-17 ENCOUNTER — Encounter: Payer: Self-pay | Admitting: Family Medicine

## 2024-01-11 DIAGNOSIS — Z981 Arthrodesis status: Secondary | ICD-10-CM | POA: Diagnosis not present

## 2024-01-11 DIAGNOSIS — M961 Postlaminectomy syndrome, not elsewhere classified: Secondary | ICD-10-CM | POA: Diagnosis not present

## 2024-01-11 DIAGNOSIS — M542 Cervicalgia: Secondary | ICD-10-CM | POA: Diagnosis not present

## 2024-01-11 DIAGNOSIS — M5417 Radiculopathy, lumbosacral region: Secondary | ICD-10-CM | POA: Diagnosis not present

## 2024-01-11 DIAGNOSIS — M25559 Pain in unspecified hip: Secondary | ICD-10-CM | POA: Diagnosis not present

## 2024-01-11 DIAGNOSIS — Z79899 Other long term (current) drug therapy: Secondary | ICD-10-CM | POA: Diagnosis not present

## 2024-01-18 ENCOUNTER — Encounter: Payer: Self-pay | Admitting: Sports Medicine

## 2024-03-08 ENCOUNTER — Other Ambulatory Visit: Payer: Self-pay | Admitting: Family Medicine

## 2024-04-10 DIAGNOSIS — M5417 Radiculopathy, lumbosacral region: Secondary | ICD-10-CM | POA: Diagnosis not present

## 2024-04-10 DIAGNOSIS — M961 Postlaminectomy syndrome, not elsewhere classified: Secondary | ICD-10-CM | POA: Diagnosis not present

## 2024-04-10 DIAGNOSIS — M7061 Trochanteric bursitis, right hip: Secondary | ICD-10-CM | POA: Diagnosis not present

## 2024-04-10 DIAGNOSIS — Z79899 Other long term (current) drug therapy: Secondary | ICD-10-CM | POA: Diagnosis not present

## 2024-04-10 DIAGNOSIS — Z5181 Encounter for therapeutic drug level monitoring: Secondary | ICD-10-CM | POA: Diagnosis not present

## 2024-04-10 DIAGNOSIS — M542 Cervicalgia: Secondary | ICD-10-CM | POA: Diagnosis not present

## 2024-04-11 ENCOUNTER — Other Ambulatory Visit: Payer: Self-pay | Admitting: Family Medicine

## 2024-04-11 DIAGNOSIS — E7849 Other hyperlipidemia: Secondary | ICD-10-CM

## 2024-05-05 ENCOUNTER — Encounter: Payer: Self-pay | Admitting: Physician Assistant

## 2024-05-05 ENCOUNTER — Encounter: Payer: Self-pay | Admitting: Family Medicine

## 2024-05-05 ENCOUNTER — Ambulatory Visit: Admitting: Physician Assistant

## 2024-05-05 VITALS — BP 110/83 | HR 101 | Temp 98.7°F | Ht 63.0 in | Wt 144.0 lb

## 2024-05-05 DIAGNOSIS — R10A1 Flank pain, right side: Secondary | ICD-10-CM | POA: Diagnosis not present

## 2024-05-05 DIAGNOSIS — R350 Frequency of micturition: Secondary | ICD-10-CM

## 2024-05-05 DIAGNOSIS — R809 Proteinuria, unspecified: Secondary | ICD-10-CM | POA: Insufficient documentation

## 2024-05-05 DIAGNOSIS — R6889 Other general symptoms and signs: Secondary | ICD-10-CM

## 2024-05-05 DIAGNOSIS — R112 Nausea with vomiting, unspecified: Secondary | ICD-10-CM | POA: Insufficient documentation

## 2024-05-05 DIAGNOSIS — R519 Headache, unspecified: Secondary | ICD-10-CM | POA: Diagnosis not present

## 2024-05-05 DIAGNOSIS — R6883 Chills (without fever): Secondary | ICD-10-CM | POA: Diagnosis not present

## 2024-05-05 LAB — POCT URINALYSIS DIP (CLINITEK)
Blood, UA: NEGATIVE
Glucose, UA: NEGATIVE mg/dL
Leukocytes, UA: NEGATIVE
Nitrite, UA: NEGATIVE
POC PROTEIN,UA: 30 — AB
Spec Grav, UA: >=1.03 — AB
Urobilinogen, UA: 1 U/dL
pH, UA: 6

## 2024-05-05 LAB — POC SOFIA 2 FLU + SARS ANTIGEN FIA
Influenza A, POC: NEGATIVE
Influenza B, POC: NEGATIVE
SARS Coronavirus 2 Ag: NEGATIVE

## 2024-05-05 MED ORDER — ONDANSETRON 8 MG PO TBDP: 8.0000 mg | ORAL_TABLET | Freq: Three times a day (TID) | ORAL | 1 refills | Status: AC | PRN

## 2024-05-05 MED ORDER — OSELTAMIVIR PHOSPHATE 75 MG PO CAPS: 75.0000 mg | ORAL_CAPSULE | Freq: Two times a day (BID) | ORAL | 0 refills | Status: AC

## 2024-05-05 NOTE — Telephone Encounter (Signed)
 Patient scheduled today with Tandy Gaw, PA

## 2024-05-05 NOTE — Progress Notes (Signed)
 "  Acute Office Visit  Subjective:     Patient ID: Katrina Fernandez, female    DOB: 03/21/1966, 58 y.o.   MRN: [id number]  Chief Complaint  Patient presents with   Emesis    Daughter has been sick mom thinks she has the flu - daughter has had fever chills and emesis   Chills    No fever yet   Headache    Pressure in the front area    HPI .SABRADiscussed the use of AI scribe software for clinical note transcription with the patient, who gave verbal consent to proceed.  History of Present Illness Katrina Fernandez is a 58 year old female who presents with viral symptoms and gastrointestinal issues.  Viral symptoms - Negative influenza and COVID-19 tests - No fever, cough, or sore throat - No influenza or COVID-19 vaccines received this year - Concern about exposing her mother to illness  Gastrointestinal symptoms - Nausea and occasional vomiting or dry heaving, occurring unexpectedly - Symptoms attributed to GLP-1 medication - GLP-1 injection administered on Wednesday with no dose change - Not taking anti-nausea medication such as Zofran   Analgesic medication use - Ran out of tramadol  and missed one day of dosing - Resumed tramadol  this morning  Genitourinary symptoms - Renal pain - Increased urinary frequency - Recent minor urinary incontinence - No fever at home or in the office     ROS See HPI.      Objective:    BP 110/83 (BP Location: Left Arm, Patient Position: Sitting, Cuff Size: Normal)   Pulse (!) 101   Temp 98.7 F (37.1 C) (Oral)   Ht 5' 3 (1.6 m)   Wt 144 lb (65.3 kg)   SpO2 99%   BMI 25.51 kg/m  BP Readings from Last 3 Encounters:  05/05/24 110/83  12/13/23 90/62  08/27/23 113/79   Wt Readings from Last 3 Encounters:  05/05/24 144 lb (65.3 kg)  12/13/23 149 lb (67.6 kg)  08/27/23 154 lb (69.9 kg)    .SABRA Results for orders placed or performed in visit on 05/05/24  POC SOFIA 2 FLU + SARS ANTIGEN FIA   Collection Time: 05/05/24   2:16 PM  Result Value Ref Range   Influenza A, POC Negative Negative   Influenza B, POC Negative Negative   SARS Coronavirus 2 Ag Negative Negative  POCT URINALYSIS DIP (CLINITEK)   Collection Time: 05/05/24  2:17 PM  Result Value Ref Range   Color, UA yellow yellow   Clarity, UA cloudy (A) clear   Glucose, UA negative negative mg/dL   Bilirubin, UA small (A) negative   Ketones, POC UA trace (5) (A) negative mg/dL   Spec Grav, UA >=8.969 (A) 1.010 - 1.025   Blood, UA negative negative   pH, UA 6.0 5.0 - 8.0   POC PROTEIN,UA =30 (A) negative, trace   Urobilinogen, UA 1.0 0.2 or 1.0 E.U./dL   Nitrite, UA Negative Negative   Leukocytes, UA Negative Negative     Physical Exam Constitutional:      Appearance: She is well-developed.  HENT:     Head: Normocephalic.     Mouth/Throat:     Mouth: Mucous membranes are moist.  Eyes:     Extraocular Movements: Extraocular movements intact.     Right eye: Normal extraocular motion.     Left eye: Normal extraocular motion.     Pupils: Pupils are equal, round, and reactive to light.  Cardiovascular:     Rate  and Rhythm: Regular rhythm. Tachycardia present.  Pulmonary:     Breath sounds: Normal breath sounds.  Abdominal:     General: There is no distension.     Palpations: Abdomen is soft.     Tenderness: There is no abdominal tenderness. There is no guarding.  Musculoskeletal:     Cervical back: Normal range of motion.  Lymphadenopathy:     Cervical: No cervical adenopathy.  Neurological:     Mental Status: She is alert.  Psychiatric:        Mood and Affect: Mood normal.          Assessment & Plan:  SABRASABRAJeannine Fernandez was seen today for emesis, chills and headache.  Diagnoses and all orders for this visit:  Nausea and vomiting, unspecified vomiting type -     POC SOFIA 2 FLU + SARS ANTIGEN FIA -     ondansetron  (ZOFRAN -ODT) 8 MG disintegrating tablet; Take 1 tablet (8 mg total) by mouth every 8 (eight) hours as  needed.  Chills (without fever) -     POC SOFIA 2 FLU + SARS ANTIGEN FIA  Acute nonintractable headache, unspecified headache type -     POC SOFIA 2 FLU + SARS ANTIGEN FIA  Right flank pain -     POCT URINALYSIS DIP (CLINITEK)  Urinary frequency -     POCT URINALYSIS DIP (CLINITEK)  Flu-like symptoms -     oseltamivir (TAMIFLU) 75 MG capsule; Take 1 capsule (75 mg total) by mouth 2 (two) times daily.  Proteinuria, unspecified type   Assessment & Plan Acute viral syndrome with gastrointestinal symptoms Negative for influenza and COVID-19. Symptoms likely exacerbated by GLP-1 agonist use. Possible viral syndrome. - Prescribed Tamiflu to reduce viral load and prevent complications. - Prescribed Zofran  for nausea, sublingual administration. - Advised bland diet, starting with crackers, gradually increasing intake. - Instructed to monitor for worsening nausea, discontinue Tamiflu if nausea worsens. - Advised to report new symptoms or increased coughing.  GLP-1 agonist-induced gastrointestinal side effects Gastrointestinal symptoms exacerbated by GLP-1 agonist use, consistent with known side effects. - Prescribed Zofran  for nausea management.  Urinary frequency and right flank pain Reports urinary frequency and right flank pain. Differential includes possible kidney infection. UA negative for blood, leuks, nitrites today. Positive for proteinuria but has hx of kidney disease. - No concerns with vitals - No worrisome PE findings. - Symptomatic cared discussed.  - follow up if symptoms worsen or change.   General health maintenance Has not received flu or COVID-19 vaccines this year.    No follow-ups on file.  Katrina Reader, PA-C   "

## 2024-05-05 NOTE — Patient Instructions (Signed)
 Viral Gastroenteritis, Adult  Viral gastroenteritis is also known as the stomach flu. This condition may affect your stomach, your small intestine, and your large intestine. It can cause sudden watery poop (diarrhea), fever, and vomiting. This condition is caused by certain germs (viruses). These germs can be passed from person to person very easily (are contagious). Having watery poop and vomiting can make you feel weak and cause you to not have enough water in your body (get dehydrated). This can make you tired and thirsty, make you have a dry mouth, and make it so you pee (urinate) less often. It is important to replace the fluids that you lose from having watery poop and vomiting. What are the causes? You can get sick by catching germs from other people. You can also get sick by: Eating food, drinking water, or touching a surface that has the germs on it (is contaminated). Sharing utensils or other personal items with a person who is sick. What increases the risk? Having a weak body defense system (immune system). Living with one or more children who are younger than 2 years. Living in a nursing home. Going on cruise ships. What are the signs or symptoms? Symptoms of this condition start suddenly. Symptoms may last for a few days or for as long as a week. Common symptoms include: Watery poop. Vomiting. Other symptoms include: Fever. Headache. Feeling tired (fatigue). Pain in the belly (abdomen). Chills. Feeling weak. Feeling like you may vomit (nauseous). Muscle aches. Not feeling hungry. How is this treated? This condition typically goes away on its own. The focus of treatment is to replace the fluids that you lose. This condition may be treated with: An ORS (oral rehydration solution). This is a drink that helps you replace fluids and minerals your body lost. It is sold at pharmacies and stores. Medicines to help with your symptoms. Probiotic supplements to reduce symptoms of  watery poop. Fluids given through an IV tube, if needed. Older adults and people with other diseases or a weak body defense system are at higher risk for not having enough water in the body. Follow these instructions at home: Eating and drinking  Take an ORS as told by your doctor. Drink clear fluids in small amounts as you are able. Clear fluids include: Water. Ice chips. Fruit juice that has water added to it (is diluted). Low-calorie sports drinks. Drink enough fluid to keep your pee (urine) pale yellow. Eat small amounts of healthy foods every 3-4 hours as you are able. This may include whole grains, fruits, vegetables, lean meats, and yogurt. Avoid fluids that have a lot of sugar or caffeine in them. This includes energy drinks, sports drinks, and soda. Avoid spicy or fatty foods. Avoid alcohol. General instructions  Wash your hands often. This is very important after you have watery poop or you vomit. If you cannot use soap and water, use hand sanitizer. Make sure that all people in your home wash their hands well and often. Take over-the-counter and prescription medicines only as told by your doctor. Rest at home while you get better. Watch your condition for any changes. Take a warm bath to help with any burning or pain from having watery poop. Keep all follow-up visits. Contact a doctor if: You cannot keep fluids down. Your symptoms get worse. You have new symptoms. You feel light-headed or dizzy. You have muscle cramps. Get help right away if: You have chest pain. You have trouble breathing, or you are breathing very fast.  You have a fast heartbeat. You feel very weak or you faint. You have a very bad headache, a stiff neck, or both. You have a rash. You have very bad pain, cramping, or bloating in your belly. Your skin feels cold and clammy. You feel mixed up (confused). You have pain when you pee. You have signs of not having enough water in the body, such  as: Dark pee, hardly any pee, or no pee. Cracked lips. Dry mouth. Sunken eyes. Feeling very sleepy. Feeling weak. You have signs of bleeding, such as: You see blood in your vomit. Your vomit looks like coffee grounds. You have bloody or black poop or poop that looks like tar. These symptoms may be an emergency. Get help right away. Call 911. Do not wait to see if the symptoms will go away. Do not drive yourself to the hospital. Summary Viral gastroenteritis is also known as the stomach flu. This condition can cause sudden watery poop (diarrhea), fever, and vomiting. These germs can be passed from person to person very easily. Take an ORS (oral rehydration solution) as told by your doctor. This is a drink that is sold at pharmacies and stores. Wash your hands often, especially after having watery poop or vomiting. If you cannot use soap and water, use hand sanitizer. This information is not intended to replace advice given to you by your health care provider. Make sure you discuss any questions you have with your health care provider. Document Revised: 03/03/2021 Document Reviewed: 03/03/2021 Elsevier Patient Education  2024 [employer].

## 2024-05-08 ENCOUNTER — Other Ambulatory Visit: Payer: Self-pay | Admitting: Family Medicine

## 2024-05-17 ENCOUNTER — Other Ambulatory Visit (HOSPITAL_COMMUNITY): Payer: Self-pay

## 2024-05-30 ENCOUNTER — Telehealth: Payer: Self-pay | Admitting: Pharmacy Technician

## 2024-05-30 ENCOUNTER — Other Ambulatory Visit (HOSPITAL_COMMUNITY): Payer: Self-pay

## 2024-05-30 NOTE — Telephone Encounter (Signed)
 Pharmacy Patient Advocate Encounter   Received notification from [employer] that prior authorization for Mounjaro  15MG /0.5ML auto-injectors is due for renewal.   Insurance verification completed.   The patient is insured through [employer].  Action: Refill too soon. PA is not needed at this time. Medication was filled 05/09/2024. Next eligible fill date is 07/11/2024.  Test claims shows PA on file that does not expire until 05/17/2025.

## 2024-06-16 ENCOUNTER — Other Ambulatory Visit: Payer: Self-pay | Admitting: Family Medicine

## 2024-08-09 ENCOUNTER — Ambulatory Visit
